# Patient Record
Sex: Female | Born: 1935 | Race: White | Hispanic: No | Marital: Married | Smoking: Never smoker
Health system: Southern US, Community
[De-identification: ages and names within clinical notes are randomized; demographics above are authoritative.]

## PROBLEM LIST (undated history)

## (undated) DIAGNOSIS — Q231 Congenital insufficiency of aortic valve: Secondary | ICD-10-CM

## (undated) DIAGNOSIS — Z8619 Personal history of other infectious and parasitic diseases: Secondary | ICD-10-CM

## (undated) DIAGNOSIS — S83209A Unspecified tear of unspecified meniscus, current injury, unspecified knee, initial encounter: Secondary | ICD-10-CM

## (undated) DIAGNOSIS — K449 Diaphragmatic hernia without obstruction or gangrene: Secondary | ICD-10-CM

## (undated) DIAGNOSIS — K5790 Diverticulosis of intestine, part unspecified, without perforation or abscess without bleeding: Secondary | ICD-10-CM

## (undated) DIAGNOSIS — M8580 Other specified disorders of bone density and structure, unspecified site: Secondary | ICD-10-CM

## (undated) DIAGNOSIS — Z8719 Personal history of other diseases of the digestive system: Secondary | ICD-10-CM

## (undated) DIAGNOSIS — G8929 Other chronic pain: Secondary | ICD-10-CM

## (undated) DIAGNOSIS — N809 Endometriosis, unspecified: Secondary | ICD-10-CM

## (undated) DIAGNOSIS — E785 Hyperlipidemia, unspecified: Secondary | ICD-10-CM

## (undated) DIAGNOSIS — I712 Thoracic aortic aneurysm, without rupture: Secondary | ICD-10-CM

## (undated) DIAGNOSIS — IMO0002 Reserved for concepts with insufficient information to code with codable children: Secondary | ICD-10-CM

## (undated) DIAGNOSIS — I341 Nonrheumatic mitral (valve) prolapse: Secondary | ICD-10-CM

## (undated) DIAGNOSIS — I8390 Asymptomatic varicose veins of unspecified lower extremity: Secondary | ICD-10-CM

## (undated) DIAGNOSIS — K317 Polyp of stomach and duodenum: Secondary | ICD-10-CM

## (undated) DIAGNOSIS — Z9889 Other specified postprocedural states: Secondary | ICD-10-CM

## (undated) DIAGNOSIS — E7800 Pure hypercholesterolemia, unspecified: Secondary | ICD-10-CM

## (undated) DIAGNOSIS — E119 Type 2 diabetes mellitus without complications: Secondary | ICD-10-CM

## (undated) DIAGNOSIS — I6529 Occlusion and stenosis of unspecified carotid artery: Secondary | ICD-10-CM

## (undated) DIAGNOSIS — K589 Irritable bowel syndrome without diarrhea: Secondary | ICD-10-CM

## (undated) DIAGNOSIS — G629 Polyneuropathy, unspecified: Secondary | ICD-10-CM

## (undated) DIAGNOSIS — S92911A Unspecified fracture of right toe(s), initial encounter for closed fracture: Secondary | ICD-10-CM

## (undated) DIAGNOSIS — E611 Iron deficiency: Secondary | ICD-10-CM

## (undated) DIAGNOSIS — R011 Cardiac murmur, unspecified: Secondary | ICD-10-CM

## (undated) DIAGNOSIS — G453 Amaurosis fugax: Secondary | ICD-10-CM

## (undated) DIAGNOSIS — Q2381 Bicuspid aortic valve: Secondary | ICD-10-CM

## (undated) DIAGNOSIS — R1032 Left lower quadrant pain: Secondary | ICD-10-CM

## (undated) DIAGNOSIS — G250 Essential tremor: Secondary | ICD-10-CM

## (undated) DIAGNOSIS — K2980 Duodenitis without bleeding: Secondary | ICD-10-CM

## (undated) DIAGNOSIS — F419 Anxiety disorder, unspecified: Secondary | ICD-10-CM

## (undated) DIAGNOSIS — N390 Urinary tract infection, site not specified: Secondary | ICD-10-CM

## (undated) DIAGNOSIS — K5792 Diverticulitis of intestine, part unspecified, without perforation or abscess without bleeding: Secondary | ICD-10-CM

## (undated) DIAGNOSIS — I10 Essential (primary) hypertension: Secondary | ICD-10-CM

## (undated) DIAGNOSIS — J309 Allergic rhinitis, unspecified: Secondary | ICD-10-CM

## (undated) DIAGNOSIS — D126 Benign neoplasm of colon, unspecified: Secondary | ICD-10-CM

## (undated) DIAGNOSIS — K219 Gastro-esophageal reflux disease without esophagitis: Secondary | ICD-10-CM

## (undated) DIAGNOSIS — Z8679 Personal history of other diseases of the circulatory system: Secondary | ICD-10-CM

## (undated) DIAGNOSIS — C801 Malignant (primary) neoplasm, unspecified: Secondary | ICD-10-CM

## (undated) HISTORY — PX: INTRAOCULAR LENS INSERTION: SHX110

## (undated) HISTORY — DX: Other specified disorders of bone density and structure, unspecified site: M85.80

## (undated) HISTORY — DX: Allergic rhinitis, unspecified: J30.9

## (undated) HISTORY — DX: Unspecified fracture of right toe(s), initial encounter for closed fracture: S92.911A

## (undated) HISTORY — DX: Polyneuropathy, unspecified: G62.9

## (undated) HISTORY — PX: CEREBRAL ANEURYSM REPAIR: SHX164

## (undated) HISTORY — DX: Unspecified tear of unspecified meniscus, current injury, unspecified knee, initial encounter: S83.209A

## (undated) HISTORY — PX: APPENDECTOMY: SHX54

## (undated) HISTORY — DX: Irritable bowel syndrome, unspecified: K58.9

## (undated) HISTORY — DX: Essential (primary) hypertension: I10

## (undated) HISTORY — DX: Bicuspid aortic valve: Q23.81

## (undated) HISTORY — DX: Urinary tract infection, site not specified: N39.0

## (undated) HISTORY — DX: Personal history of other diseases of the digestive system: Z87.19

## (undated) HISTORY — DX: Occlusion and stenosis of unspecified carotid artery: I65.29

## (undated) HISTORY — DX: Diverticulitis of intestine, part unspecified, without perforation or abscess without bleeding: K57.92

## (undated) HISTORY — DX: Thoracic aortic aneurysm, without rupture: I71.2

## (undated) HISTORY — DX: Type 2 diabetes mellitus without complications: E11.9

## (undated) HISTORY — DX: Reserved for concepts with insufficient information to code with codable children: IMO0002

## (undated) HISTORY — PX: CORONARY ANGIOPLASTY WITH STENT PLACEMENT: SHX49

## (undated) HISTORY — DX: Essential tremor: G25.0

## (undated) HISTORY — DX: Cardiac murmur, unspecified: R01.1

## (undated) HISTORY — PX: COLON RESECTION: SHX5231

## (undated) HISTORY — DX: Personal history of other diseases of the circulatory system: Z98.890

## (undated) HISTORY — DX: Personal history of other diseases of the circulatory system: Z86.79

## (undated) HISTORY — DX: Anxiety disorder, unspecified: F41.9

## (undated) HISTORY — DX: Endometriosis, unspecified: N80.9

## (undated) HISTORY — PX: TONSILLECTOMY: SUR1361

## (undated) HISTORY — DX: Other chronic pain: G89.29

## (undated) HISTORY — DX: Iron deficiency: E61.1

## (undated) HISTORY — DX: Duodenitis without bleeding: K29.80

## (undated) HISTORY — DX: Nonrheumatic mitral (valve) prolapse: I34.1

## (undated) HISTORY — DX: Benign neoplasm of colon, unspecified: D12.6

## (undated) HISTORY — PX: FLEXIBLE SIGMOIDOSCOPY: SHX1649

## (undated) HISTORY — DX: Diverticulosis of intestine, part unspecified, without perforation or abscess without bleeding: K57.90

## (undated) HISTORY — DX: Pure hypercholesterolemia, unspecified: E78.00

## (undated) HISTORY — DX: Personal history of other infectious and parasitic diseases: Z86.19

## (undated) HISTORY — DX: Polyp of stomach and duodenum: K31.7

## (undated) HISTORY — DX: Hyperlipidemia, unspecified: E78.5

## (undated) HISTORY — DX: Gastro-esophageal reflux disease without esophagitis: K21.9

## (undated) HISTORY — PX: CATARACT EXTRACTION: SUR2

## (undated) HISTORY — DX: Diaphragmatic hernia without obstruction or gangrene: K44.9

## (undated) HISTORY — DX: Amaurosis fugax: G45.3

## (undated) HISTORY — DX: Congenital insufficiency of aortic valve: Q23.1

## (undated) HISTORY — PX: ABDOMINAL HYSTERECTOMY: SHX81

## (undated) HISTORY — DX: Left lower quadrant pain: R10.32

## (undated) HISTORY — DX: Asymptomatic varicose veins of unspecified lower extremity: I83.90

## (undated) HISTORY — PX: HAND SURGERY: SHX662

---

## 1968-02-01 DIAGNOSIS — D126 Benign neoplasm of colon, unspecified: Secondary | ICD-10-CM

## 1968-02-01 HISTORY — DX: Benign neoplasm of colon, unspecified: D12.6

## 1998-04-09 ENCOUNTER — Ambulatory Visit (HOSPITAL_COMMUNITY): Admission: RE | Admit: 1998-04-09 | Discharge: 1998-04-09 | Payer: Self-pay | Admitting: Gastroenterology

## 1999-01-18 ENCOUNTER — Ambulatory Visit: Admission: RE | Admit: 1999-01-18 | Discharge: 1999-01-18 | Payer: Self-pay | Admitting: Neurology

## 2000-08-17 ENCOUNTER — Encounter: Payer: Self-pay | Admitting: Emergency Medicine

## 2000-08-17 ENCOUNTER — Inpatient Hospital Stay (HOSPITAL_COMMUNITY): Admission: AD | Admit: 2000-08-17 | Discharge: 2000-08-19 | Payer: Self-pay | Admitting: Cardiology

## 2000-08-18 ENCOUNTER — Encounter: Payer: Self-pay | Admitting: Cardiology

## 2000-11-15 ENCOUNTER — Other Ambulatory Visit: Admission: RE | Admit: 2000-11-15 | Discharge: 2000-11-15 | Payer: Self-pay | Admitting: Gastroenterology

## 2000-11-15 ENCOUNTER — Encounter (INDEPENDENT_AMBULATORY_CARE_PROVIDER_SITE_OTHER): Payer: Self-pay | Admitting: Specialist

## 2001-05-29 ENCOUNTER — Other Ambulatory Visit: Admission: RE | Admit: 2001-05-29 | Discharge: 2001-05-29 | Payer: Self-pay | Admitting: Obstetrics & Gynecology

## 2001-10-28 ENCOUNTER — Encounter: Payer: Self-pay | Admitting: *Deleted

## 2001-10-28 ENCOUNTER — Ambulatory Visit (HOSPITAL_COMMUNITY): Admission: RE | Admit: 2001-10-28 | Discharge: 2001-10-28 | Payer: Self-pay | Admitting: *Deleted

## 2001-11-29 ENCOUNTER — Encounter: Payer: Self-pay | Admitting: Neurology

## 2001-11-29 ENCOUNTER — Ambulatory Visit (HOSPITAL_COMMUNITY): Admission: RE | Admit: 2001-11-29 | Discharge: 2001-11-29 | Payer: Self-pay | Admitting: Neurology

## 2001-12-03 ENCOUNTER — Ambulatory Visit (HOSPITAL_COMMUNITY): Admission: RE | Admit: 2001-12-03 | Discharge: 2001-12-03 | Payer: Self-pay | Admitting: Neurology

## 2001-12-14 ENCOUNTER — Ambulatory Visit: Admission: RE | Admit: 2001-12-14 | Discharge: 2001-12-14 | Payer: Self-pay | Admitting: Interventional Radiology

## 2002-01-10 ENCOUNTER — Inpatient Hospital Stay (HOSPITAL_COMMUNITY): Admission: RE | Admit: 2002-01-10 | Discharge: 2002-01-12 | Payer: Self-pay | Admitting: Interventional Radiology

## 2002-01-14 ENCOUNTER — Ambulatory Visit (HOSPITAL_COMMUNITY): Admission: RE | Admit: 2002-01-14 | Discharge: 2002-01-14 | Payer: Self-pay | Admitting: Interventional Radiology

## 2002-01-28 ENCOUNTER — Ambulatory Visit (HOSPITAL_COMMUNITY): Admission: RE | Admit: 2002-01-28 | Discharge: 2002-01-28 | Payer: Self-pay | Admitting: Interventional Radiology

## 2002-01-30 ENCOUNTER — Ambulatory Visit (HOSPITAL_COMMUNITY): Admission: RE | Admit: 2002-01-30 | Discharge: 2002-01-30 | Payer: Self-pay | Admitting: Family Medicine

## 2002-01-30 ENCOUNTER — Encounter: Payer: Self-pay | Admitting: Family Medicine

## 2002-04-04 ENCOUNTER — Inpatient Hospital Stay (HOSPITAL_COMMUNITY): Admission: RE | Admit: 2002-04-04 | Discharge: 2002-04-06 | Payer: Self-pay | Admitting: Interventional Radiology

## 2002-05-27 ENCOUNTER — Ambulatory Visit (HOSPITAL_COMMUNITY): Admission: RE | Admit: 2002-05-27 | Discharge: 2002-05-27 | Payer: Self-pay | Admitting: Neurology

## 2002-06-01 ENCOUNTER — Ambulatory Visit (HOSPITAL_COMMUNITY): Admission: RE | Admit: 2002-06-01 | Discharge: 2002-06-01 | Payer: Self-pay | Admitting: Neurology

## 2002-06-06 ENCOUNTER — Ambulatory Visit (HOSPITAL_COMMUNITY): Admission: RE | Admit: 2002-06-06 | Discharge: 2002-06-06 | Payer: Self-pay | Admitting: Neurology

## 2002-09-26 ENCOUNTER — Ambulatory Visit (HOSPITAL_COMMUNITY): Admission: RE | Admit: 2002-09-26 | Discharge: 2002-09-26 | Payer: Self-pay | Admitting: Interventional Radiology

## 2002-12-13 ENCOUNTER — Ambulatory Visit (HOSPITAL_COMMUNITY): Admission: RE | Admit: 2002-12-13 | Discharge: 2002-12-13 | Payer: Self-pay | Admitting: Gastroenterology

## 2003-03-05 ENCOUNTER — Ambulatory Visit (HOSPITAL_COMMUNITY): Admission: RE | Admit: 2003-03-05 | Discharge: 2003-03-05 | Payer: Self-pay | Admitting: Interventional Radiology

## 2003-11-02 ENCOUNTER — Ambulatory Visit (HOSPITAL_COMMUNITY): Admission: RE | Admit: 2003-11-02 | Discharge: 2003-11-02 | Payer: Self-pay | Admitting: Neurology

## 2003-11-30 ENCOUNTER — Ambulatory Visit (HOSPITAL_COMMUNITY): Admission: RE | Admit: 2003-11-30 | Discharge: 2003-11-30 | Payer: Self-pay | Admitting: Interventional Radiology

## 2004-02-09 ENCOUNTER — Ambulatory Visit (HOSPITAL_COMMUNITY): Admission: RE | Admit: 2004-02-09 | Discharge: 2004-02-09 | Payer: Self-pay | Admitting: Interventional Radiology

## 2004-03-02 ENCOUNTER — Encounter: Admission: RE | Admit: 2004-03-02 | Discharge: 2004-05-31 | Payer: Self-pay | Admitting: Family Medicine

## 2004-06-03 ENCOUNTER — Encounter: Admission: RE | Admit: 2004-06-03 | Discharge: 2004-09-01 | Payer: Self-pay | Admitting: Family Medicine

## 2004-09-14 ENCOUNTER — Inpatient Hospital Stay (HOSPITAL_COMMUNITY): Admission: EM | Admit: 2004-09-14 | Discharge: 2004-09-15 | Payer: Self-pay | Admitting: Emergency Medicine

## 2004-09-14 ENCOUNTER — Encounter (INDEPENDENT_AMBULATORY_CARE_PROVIDER_SITE_OTHER): Payer: Self-pay | Admitting: Cardiology

## 2005-01-20 ENCOUNTER — Ambulatory Visit: Payer: Self-pay | Admitting: Gastroenterology

## 2005-02-03 ENCOUNTER — Ambulatory Visit: Payer: Self-pay | Admitting: Gastroenterology

## 2005-02-03 DIAGNOSIS — K449 Diaphragmatic hernia without obstruction or gangrene: Secondary | ICD-10-CM

## 2005-02-03 HISTORY — DX: Diaphragmatic hernia without obstruction or gangrene: K44.9

## 2005-02-03 HISTORY — PX: COLONOSCOPY W/ BIOPSIES AND POLYPECTOMY: SHX1376

## 2005-02-03 HISTORY — PX: ESOPHAGOGASTRODUODENOSCOPY: SHX1529

## 2005-03-11 ENCOUNTER — Ambulatory Visit (HOSPITAL_COMMUNITY): Admission: RE | Admit: 2005-03-11 | Discharge: 2005-03-11 | Payer: Self-pay | Admitting: Interventional Radiology

## 2005-05-16 ENCOUNTER — Encounter: Payer: Self-pay | Admitting: Vascular Surgery

## 2005-05-16 ENCOUNTER — Ambulatory Visit (HOSPITAL_COMMUNITY): Admission: RE | Admit: 2005-05-16 | Discharge: 2005-05-16 | Payer: Self-pay | Admitting: Family Medicine

## 2005-12-01 ENCOUNTER — Ambulatory Visit (HOSPITAL_COMMUNITY): Admission: RE | Admit: 2005-12-01 | Discharge: 2005-12-01 | Payer: Self-pay | Admitting: Interventional Radiology

## 2005-12-09 ENCOUNTER — Encounter: Payer: Self-pay | Admitting: Interventional Radiology

## 2006-06-29 ENCOUNTER — Ambulatory Visit (HOSPITAL_COMMUNITY): Admission: RE | Admit: 2006-06-29 | Discharge: 2006-06-29 | Payer: Self-pay | Admitting: Family Medicine

## 2006-06-29 ENCOUNTER — Ambulatory Visit: Payer: Self-pay | Admitting: Vascular Surgery

## 2006-06-29 ENCOUNTER — Encounter (INDEPENDENT_AMBULATORY_CARE_PROVIDER_SITE_OTHER): Payer: Self-pay | Admitting: Family Medicine

## 2006-07-01 ENCOUNTER — Emergency Department (HOSPITAL_COMMUNITY): Admission: EM | Admit: 2006-07-01 | Discharge: 2006-07-01 | Payer: Self-pay | Admitting: Emergency Medicine

## 2006-07-13 ENCOUNTER — Ambulatory Visit (HOSPITAL_COMMUNITY): Admission: RE | Admit: 2006-07-13 | Discharge: 2006-07-13 | Payer: Self-pay | Admitting: Orthopedic Surgery

## 2006-12-13 ENCOUNTER — Ambulatory Visit (HOSPITAL_COMMUNITY): Admission: RE | Admit: 2006-12-13 | Discharge: 2006-12-13 | Payer: Self-pay | Admitting: Interventional Radiology

## 2007-08-26 ENCOUNTER — Inpatient Hospital Stay (HOSPITAL_COMMUNITY): Admission: EM | Admit: 2007-08-26 | Discharge: 2007-08-29 | Payer: Self-pay | Admitting: Emergency Medicine

## 2007-08-26 HISTORY — PX: CARPAL TUNNEL RELEASE: SHX101

## 2007-08-26 HISTORY — PX: OTHER SURGICAL HISTORY: SHX169

## 2007-08-27 ENCOUNTER — Ambulatory Visit: Payer: Self-pay | Admitting: Vascular Surgery

## 2007-08-27 ENCOUNTER — Encounter (INDEPENDENT_AMBULATORY_CARE_PROVIDER_SITE_OTHER): Payer: Self-pay | Admitting: Internal Medicine

## 2007-08-28 ENCOUNTER — Encounter (INDEPENDENT_AMBULATORY_CARE_PROVIDER_SITE_OTHER): Payer: Self-pay | Admitting: Internal Medicine

## 2008-01-01 ENCOUNTER — Encounter: Payer: Self-pay | Admitting: Internal Medicine

## 2008-04-07 ENCOUNTER — Ambulatory Visit: Payer: Self-pay | Admitting: Internal Medicine

## 2008-04-07 DIAGNOSIS — K589 Irritable bowel syndrome without diarrhea: Secondary | ICD-10-CM

## 2008-04-07 DIAGNOSIS — Z860100 Personal history of colon polyps, unspecified: Secondary | ICD-10-CM | POA: Insufficient documentation

## 2008-04-07 DIAGNOSIS — Z85038 Personal history of other malignant neoplasm of large intestine: Secondary | ICD-10-CM | POA: Insufficient documentation

## 2008-05-21 ENCOUNTER — Ambulatory Visit: Payer: Self-pay | Admitting: Internal Medicine

## 2008-05-21 DIAGNOSIS — K649 Unspecified hemorrhoids: Secondary | ICD-10-CM

## 2008-05-21 DIAGNOSIS — R5383 Other fatigue: Secondary | ICD-10-CM

## 2008-05-21 DIAGNOSIS — R5381 Other malaise: Secondary | ICD-10-CM

## 2008-05-22 DIAGNOSIS — D509 Iron deficiency anemia, unspecified: Secondary | ICD-10-CM

## 2008-05-22 LAB — CONVERTED CEMR LAB
ALT: 22 units/L (ref 0–35)
BUN: 14 mg/dL (ref 6–23)
Basophils Absolute: 0 10*3/uL (ref 0.0–0.1)
CO2: 31 meq/L (ref 19–32)
Chloride: 104 meq/L (ref 96–112)
Eosinophils Absolute: 0.2 10*3/uL (ref 0.0–0.7)
Glucose, Bld: 85 mg/dL (ref 70–99)
HCT: 36.5 % (ref 36.0–46.0)
Lymphs Abs: 4.2 10*3/uL — ABNORMAL HIGH (ref 0.7–4.0)
Monocytes Absolute: 0.6 10*3/uL (ref 0.1–1.0)
Potassium: 4.4 meq/L (ref 3.5–5.1)
RBC: 4.95 M/uL (ref 3.87–5.11)
RDW: 14.7 % — ABNORMAL HIGH (ref 11.5–14.6)
Sodium: 141 meq/L (ref 135–145)
Total Bilirubin: 0.6 mg/dL (ref 0.3–1.2)
WBC: 9.1 10*3/uL (ref 4.5–10.5)

## 2008-05-26 ENCOUNTER — Ambulatory Visit: Payer: Self-pay | Admitting: Internal Medicine

## 2008-05-27 LAB — CONVERTED CEMR LAB
Ferritin: 10.2 ng/mL (ref 10.0–291.0)
Iron: 144 ug/dL (ref 42–145)
Saturation Ratios: 32.6 % (ref 20.0–50.0)
Transferrin: 315.3 mg/dL (ref 212.0–360.0)

## 2008-06-02 ENCOUNTER — Encounter: Admission: RE | Admit: 2008-06-02 | Discharge: 2008-06-02 | Payer: Self-pay | Admitting: Family Medicine

## 2008-06-07 HISTORY — PX: COLONOSCOPY: SHX174

## 2008-06-11 ENCOUNTER — Ambulatory Visit: Payer: Self-pay | Admitting: Internal Medicine

## 2008-06-17 ENCOUNTER — Ambulatory Visit: Payer: Self-pay | Admitting: Internal Medicine

## 2008-12-15 ENCOUNTER — Telehealth: Payer: Self-pay | Admitting: Internal Medicine

## 2008-12-22 ENCOUNTER — Ambulatory Visit: Payer: Self-pay | Admitting: Internal Medicine

## 2008-12-22 DIAGNOSIS — K219 Gastro-esophageal reflux disease without esophagitis: Secondary | ICD-10-CM | POA: Insufficient documentation

## 2008-12-22 DIAGNOSIS — R197 Diarrhea, unspecified: Secondary | ICD-10-CM | POA: Insufficient documentation

## 2008-12-22 LAB — CONVERTED CEMR LAB
Basophils Relative: 0.9 % (ref 0.0–3.0)
Eosinophils Absolute: 0.1 10*3/uL (ref 0.0–0.7)
Ferritin: 13.9 ng/mL (ref 10.0–291.0)
Hemoglobin: 12.9 g/dL (ref 12.0–15.0)
Lymphs Abs: 3.1 10*3/uL (ref 0.7–4.0)
MCV: 78.3 fL (ref 78.0–100.0)
RBC: 5.01 M/uL (ref 3.87–5.11)
RDW: 14.9 % — ABNORMAL HIGH (ref 11.5–14.6)

## 2009-01-05 ENCOUNTER — Ambulatory Visit (HOSPITAL_COMMUNITY): Admission: RE | Admit: 2009-01-05 | Discharge: 2009-01-05 | Payer: Self-pay | Admitting: Interventional Radiology

## 2009-09-08 ENCOUNTER — Encounter: Payer: Self-pay | Admitting: Internal Medicine

## 2010-01-04 ENCOUNTER — Encounter: Admission: RE | Admit: 2010-01-04 | Discharge: 2010-01-04 | Payer: Self-pay | Admitting: Family Medicine

## 2010-02-15 ENCOUNTER — Ambulatory Visit (HOSPITAL_COMMUNITY)
Admission: RE | Admit: 2010-02-15 | Discharge: 2010-02-15 | Payer: Self-pay | Source: Home / Self Care | Attending: Interventional Radiology | Admitting: Interventional Radiology

## 2010-02-17 LAB — BUN: BUN: 13 mg/dL (ref 6–23)

## 2010-02-17 LAB — CREATININE, SERUM
Creatinine, Ser: 0.72 mg/dL (ref 0.4–1.2)
GFR calc Af Amer: >60 mL/min (ref >60)
GFR calc non Af Amer: >60 mL/min (ref >60)

## 2010-02-21 ENCOUNTER — Encounter: Payer: Self-pay | Admitting: Orthopedic Surgery

## 2010-03-02 NOTE — Medication Information (Signed)
Summary: Prior autho & Approved for Pantoprazole/[employer]  Prior autho & Approved for Pantoprazole/[employer]   Imported By: Sherian Rein 09/14/2009 10:40:28  _____________________________________________________________________  External Attachment:    Type:   Image     Comment:   External Document

## 2010-03-23 ENCOUNTER — Encounter: Payer: Self-pay | Admitting: Internal Medicine

## 2010-03-23 ENCOUNTER — Emergency Department (HOSPITAL_COMMUNITY): Payer: Medicare Other

## 2010-03-23 ENCOUNTER — Telehealth: Payer: Self-pay | Admitting: Internal Medicine

## 2010-03-23 ENCOUNTER — Encounter (INDEPENDENT_AMBULATORY_CARE_PROVIDER_SITE_OTHER): Payer: Self-pay | Admitting: *Deleted

## 2010-03-23 ENCOUNTER — Emergency Department (HOSPITAL_COMMUNITY)
Admission: EM | Admit: 2010-03-23 | Discharge: 2010-03-23 | Disposition: A | Discharge status: Home / Self Care | Payer: Medicare Other | Attending: Emergency Medicine | Admitting: Emergency Medicine

## 2010-03-23 DIAGNOSIS — Z9889 Other specified postprocedural states: Secondary | ICD-10-CM | POA: Insufficient documentation

## 2010-03-23 DIAGNOSIS — F341 Dysthymic disorder: Secondary | ICD-10-CM | POA: Insufficient documentation

## 2010-03-23 DIAGNOSIS — I10 Essential (primary) hypertension: Secondary | ICD-10-CM | POA: Insufficient documentation

## 2010-03-23 DIAGNOSIS — E119 Type 2 diabetes mellitus without complications: Secondary | ICD-10-CM | POA: Insufficient documentation

## 2010-03-23 DIAGNOSIS — R10819 Abdominal tenderness, unspecified site: Secondary | ICD-10-CM | POA: Insufficient documentation

## 2010-03-23 DIAGNOSIS — R109 Unspecified abdominal pain: Secondary | ICD-10-CM | POA: Insufficient documentation

## 2010-03-23 DIAGNOSIS — Z79899 Other long term (current) drug therapy: Secondary | ICD-10-CM | POA: Insufficient documentation

## 2010-03-23 LAB — COMPREHENSIVE METABOLIC PANEL
ALT: 18 U/L (ref 0–35)
Albumin: 3.7 g/dL (ref 3.5–5.2)
Alkaline Phosphatase: 69 U/L (ref 39–117)
Chloride: 105 mEq/L (ref 96–112)
Glucose, Bld: 105 mg/dL — ABNORMAL HIGH (ref 70–99)
Potassium: 4.7 mEq/L (ref 3.5–5.1)
Sodium: 138 mEq/L (ref 135–145)
Total Bilirubin: 0.5 mg/dL (ref 0.3–1.2)
Total Protein: 7 g/dL (ref 6.0–8.3)

## 2010-03-23 LAB — URINALYSIS, ROUTINE W REFLEX MICROSCOPIC
Bilirubin Urine: NEGATIVE
Ketones, ur: NEGATIVE mg/dL
Protein, ur: NEGATIVE mg/dL
Urine Glucose, Fasting: NEGATIVE mg/dL
pH: 5.5 (ref 5.0–8.0)

## 2010-03-23 LAB — DIFFERENTIAL
Basophils Absolute: 0 10*3/uL (ref 0.0–0.1)
Lymphocytes Relative: 51 % — ABNORMAL HIGH (ref 12–46)
Monocytes Absolute: 0.6 10*3/uL (ref 0.1–1.0)
Monocytes Relative: 6 % (ref 3–12)
Neutro Abs: 4.6 10*3/uL (ref 1.7–7.7)
Neutrophils Relative %: 42 % — ABNORMAL LOW (ref 43–77)

## 2010-03-23 LAB — CBC
HCT: 38 % (ref 36.0–46.0)
MCV: 78.4 fL (ref 78.0–100.0)
RBC: 4.85 MIL/uL (ref 3.87–5.11)
RDW: 15.7 % — ABNORMAL HIGH (ref 11.5–15.5)
WBC: 11.1 10*3/uL — ABNORMAL HIGH (ref 4.0–10.5)

## 2010-03-23 LAB — OCCULT BLOOD, POC DEVICE: Fecal Occult Bld: NEGATIVE

## 2010-03-23 LAB — LIPASE, BLOOD: Lipase: 18 U/L (ref 11–59)

## 2010-03-23 MED ORDER — IOHEXOL 300 MG/ML  SOLN
100.0000 mL | Freq: Once | INTRAMUSCULAR | Status: AC | PRN
  Administered 2010-03-23: 100 mL via INTRAVENOUS

## 2010-03-24 ENCOUNTER — Telehealth: Payer: Self-pay | Admitting: Internal Medicine

## 2010-03-25 LAB — URINE CULTURE
Colony Count: 30000
Culture  Setup Time: 201202220148

## 2010-03-26 ENCOUNTER — Ambulatory Visit (INDEPENDENT_AMBULATORY_CARE_PROVIDER_SITE_OTHER): Payer: Medicare Other | Admitting: Physician Assistant

## 2010-03-26 ENCOUNTER — Other Ambulatory Visit: Payer: Medicare Other

## 2010-03-26 ENCOUNTER — Other Ambulatory Visit: Payer: Self-pay | Admitting: Physician Assistant

## 2010-03-26 ENCOUNTER — Encounter: Payer: Self-pay | Admitting: Physician Assistant

## 2010-03-26 DIAGNOSIS — R1084 Generalized abdominal pain: Secondary | ICD-10-CM

## 2010-03-26 DIAGNOSIS — D509 Iron deficiency anemia, unspecified: Secondary | ICD-10-CM

## 2010-03-26 DIAGNOSIS — R1032 Left lower quadrant pain: Secondary | ICD-10-CM

## 2010-03-26 DIAGNOSIS — F411 Generalized anxiety disorder: Secondary | ICD-10-CM | POA: Insufficient documentation

## 2010-03-26 DIAGNOSIS — I10 Essential (primary) hypertension: Secondary | ICD-10-CM | POA: Insufficient documentation

## 2010-03-26 DIAGNOSIS — K5730 Diverticulosis of large intestine without perforation or abscess without bleeding: Secondary | ICD-10-CM

## 2010-03-26 DIAGNOSIS — R1031 Right lower quadrant pain: Secondary | ICD-10-CM | POA: Insufficient documentation

## 2010-03-26 DIAGNOSIS — R197 Diarrhea, unspecified: Secondary | ICD-10-CM

## 2010-03-26 DIAGNOSIS — K589 Irritable bowel syndrome without diarrhea: Secondary | ICD-10-CM

## 2010-03-26 LAB — IBC PANEL: Saturation Ratios: 12.6 % — ABNORMAL LOW (ref 20.0–50.0)

## 2010-03-26 LAB — IRON: Iron: 50 ug/dL (ref 42–145)

## 2010-03-29 ENCOUNTER — Telehealth: Payer: Self-pay | Admitting: Physician Assistant

## 2010-03-29 ENCOUNTER — Encounter: Payer: Self-pay | Admitting: Physician Assistant

## 2010-03-30 NOTE — Assessment & Plan Note (Addendum)
Summary: follow up ER visit abdominal pain and rectal bleeding/sheri   History of Present Illness Visit Type: Follow-up Visit Primary GI MD: Stan Head MD [facility] Primary Provider: Tally Joe, MD Chief Complaint: Patient complains of lower abdomainl pain and BRB on the tissue and on the stool. She states that this started off and on for more than a year. She complains of some rectal pain that is constant and frequent stools after every BM. History of Present Illness:   [name] 77 YO FEMALE KNOWN TO DR. Leone Payor. SHE HAS HX OF IBS, GERD, DIVERTICULOSIS. SHE HAD A COLONOSCOPY IN 5/10 SHOWING MODERATE DIVERTICULOSIS. SHE HAS HX OF COLON POLYPS AND A POLYP WITH CARCINOMA IN SITU. NO POLYPS SEEN AT LAST COLON.  SHE COMES IN NOW WITH C/O LOWER ABDOMINAL PAIN X 1 AND 1/2 WEEKS ACROSS HER LOWER ABDOMEN, WORSE IN LLQ. IT HAS BEEN FAIRLY CONTANT, AND WORSE BEFORE AND AFTER BM'S. NO FEVER, NO NAUSEA/VOMITINGAPPETITE OK. HAS HAD FREQUENT STOOL BUT NO DIARRHEA. SHE WENT TO THE ER EARLIER THIS WEEK BECAUSE SHE COULD NOT GET IN WITH HER PRIMARY. CT ABD/PELVIS WAS NEGATIVE. WBC MILDLY ELEVATED AT 11.1, HGB 11.9, MCV78. REMAINDER OF LABS UNREMARKABLE..SHE SAYS SHE IS UNDER A LOT OF CHRONIC STRESS AS HER HUSBAND HAS DEMENTIA AND SHE IS HIS CAREGIVER. SHE FEELS SOME OF HER SXS ARE IBS BUT THE PAIN HAS BEEN DIFFERENT THIS PAST WEEK OR SO.   GI Review of Systems    Reports abdominal pain.     Location of  Abdominal pain: lower abdomen.    Denies acid reflux, belching, bloating, chest pain, dysphagia with liquids, dysphagia with solids, heartburn, loss of appetite, nausea, vomiting, vomiting blood, weight loss, and  weight gain.      Reports change in bowel habits, rectal bleeding, and  rectal pain.     Denies anal fissure, black tarry stools, constipation, diarrhea, diverticulosis, fecal incontinence, heme positive stool, hemorrhoids, irritable bowel syndrome, jaundice, light color stool, and  liver problems.    Current Medications (verified): 1)  Atenolol 50 Mg Tabs (Atenolol) .... Take 1 Tablet By Mouth Once A Day 2)  Xanax 0.5 Mg Tabs (Alprazolam) .... Take 1/2 Tablet By Mouth At Bedtime As Needed 3)  Trandolapril 4 Mg Tabs (Trandolapril) .... Take 1 Tablet By Mouth Once A Day 4)  Lasix 20 Mg Tabs (Furosemide) .... Take One By Mouth Once Daily As Needed 5)  Proctocream-Hc 2.5 % Crea (Hydrocortisone) .... Apply Small Amount To Anal Area As Needed For Hemorrhoids (Out) 6)  Lomotil 2.5-0.025 Mg Tabs (Diphenoxylate-Atropine) .... Take 1-2 Tablets By Mouth Before Meals and At Bedtime As Needed For Diarrhea 7)  Pantoprazole Sodium 40 Mg Tbec (Pantoprazole Sodium) .Marland Kitchen.. 1 By Mouth Once Daily 8)  Multivitamins  Tabs (Multiple Vitamin) .... Take One By Mouth Once Daily 9)  Coenzyme Q10 100 Mg Caps (Coenzyme Q10) .... Take One By Mouth Once Daily 10)  Fish Oil 1000 Mg Caps (Omega-3 Fatty Acids) .... Take One By Mouth Once Daily 11)  Hydrocortisone Acetate 1 % Crea (Hydrocortisone Acetate) .... Apply To Rectum As Needed  Allergies (verified): 1)  ! Asa 2)  Pcn  Past History:  Past Medical History: Anxiety Disorder Chronic Headaches (Migraine) Diabetes GERD Hyperlipidemia Hypertension Hx of Cerebral Aneurysm, status post coiling, one stable being monitored Peripheral Neuropathy, which predated diabetes IBS DIVERTICULOSIS HX OF COLON POLYPS/AND CARCINOMA IN SITU  Past Surgical History: Rt hand decompressive fasciotomy, dorsal and volar 08/26/07 Carpal Tunnel Release 08/26/07 Appendectomy Colon Resection  x 1 (Carcinoma in situ) and surgical polypectomy (2 surgeries) Hysterectomy PTCA-Stent Brain aneurysm COLONOSCOPY 5/10 [facility]  Family History: Reviewed history from 12/22/2008 and no changes required. Family History of Ovarian Cancer:Mother Family History of Diabetes: Grandparents Family History of Heart Disease: Father, Brothers Family History of Kidney Disease:Father No FH of Colon  Cancer:  Social History: Reviewed history from 04/07/2008 and no changes required. married,  Occupation: Retired Patient has never smoked.  Alcohol Use - no Daily Caffeine Use 2-3 Illicit Drug Use - no  Review of Systems       The patient complains of shortness of breath, sleeping problems, and swelling of feet/legs.  The patient denies allergy/sinus, anemia, anxiety-new, arthritis/joint pain, back pain, blood in urine, breast changes/lumps, change in vision, confusion, cough, coughing up blood, depression-new, fainting, fatigue, fever, headaches-new, hearing problems, heart murmur, heart rhythm changes, itching, menstrual pain, muscle pains/cramps, night sweats, nosebleeds, pregnancy symptoms, skin rash, sore throat, swollen lymph glands, thirst - excessive , urination - excessive , urination changes/pain, urine leakage, vision changes, and voice change.         SEE HPI  Vital Signs:  Patient profile:   77 year old female Height:      63 inches Weight:      177.2 pounds BMI:     31.50 Pulse rate:   60 / minute Pulse rhythm:   regular BP sitting:   160 / 72  (left arm) Cuff size:   regular  Vitals Entered By: Harlow Mares CMA Duncan Dull) (March 26, 2010 1:37 PM)  Physical Exam  General:  Well developed, well nourished, no acute distress. Head:  Normocephalic and atraumatic. Eyes:  PERRLA, no icterus. Lungs:  Clear throughout to auscultation. Heart:  Regular rate and rhythm; no murmurs, rubs,  or bruits. Abdomen:  SOFT, TENDER LLQ AND MILD SUPRAPUBIC, NO MASS OR HSM, NO GUARDING, BS+ Rectal:  NOT DONE Extremities:  No clubbing, cyanosis, edema or deformities noted. Neurologic:  Alert and  oriented x4;  grossly normal neurologically. Psych:  Alert and cooperative. Normal mood and affect.  Impression & Recommendations:  Problem # 1:  ABDOMINAL PAIN-LLQ (ICD-789.04) Assessment New 77 YO FEMALE WITH 1 1/2 WEEK HX OF PERSISTENT LOWER ABDOMINAL PAIN ,PRIMARILY LLQ. RECENT CT  SHOWED NO DIVERTICULITIS, BUT WBC ELEVATED 11.1. R/O MILD DIVERTICULITIS SUPERIMPOSED ON CHRONIC IBS. WILL TREAT WITH CIPRO 500 MG TWICE DAILY X 10 DAYS START ROBINUL FORTE 2 MG 1-2 DAILY AS NEEDED FOR CRAMPING.  Problem # 2:  IRON DEFICIENCY (ICD-280.9) Assessment: Unchanged PT WITH HX OF FE DEFICIENCY ANEMIA BACK TO 2010-NO RECENT IRON STUDIES OR FE RX. SHE WAS TO HAVE AN EGD FOR FURTHER EVALUATION IN 2010 BUT DID NOT RETURN FOR THAT, CHECK FE STUDIES SCHEDULE FOR EGD WITH DR. Florene Glen WILL CALL BACK TO SCHEDULE Orders: TLB-Ferritin (82728-FER) TLB-IBC Pnl (Iron/FE;Transferrin) (83550-IBC) TLB-Iron, (Fe) Total (83540-FE)  Problem # 3:  CARCINOMA IN SITU, COLON, HX OF (ICD-V10.05) Assessment: Comment Only LAST COLON 5/10-FOLLOW UP 2015  Problem # 4:  HYPERTENSION (ICD-401.9) Assessment: Comment Only  Problem # 5:  ANXIETY (ICD-300.00) Assessment: Comment Only  Problem # 6:  GERD (ICD-530.81) Assessment: Comment Only STABLE ON PROTONIX  Patient Instructions: 1)  Please go to lab, basement level. 2)  We sent two prescriptions to your pharmacy [facility], Cipro and Robinul forte.  3)  Copy sent to : Tally Joe, MD 4)  The medication list was reviewed and reconciled.  All changed / newly prescribed medications were explained.  A  complete medication list was provided to the patient / caregiver.  Prescriptions: ROBINUL-FORTE 2 MG TABS (GLYCOPYRROLATE) take 1 tab 1-2 times daily as needed for cramping and spasms  #60 x 0   Entered by:   Lowry Ram NCMA   Authorized by:   Sammuel Cooper PA-c   Signed by:   Lowry Ram NCMA on 03/26/2010   Method used:   [facility]  [phone]* (retail)       [address] [place], [place]  [address]       Ph: [phone] or [phone]       Fax: [phone]   RxID:   [id number] CIPRO 500 MG TABS (CIPROFLOXACIN HCL) Take 1 tab twice daily x 10 days  #20 x 0   Entered by:   Lowry Ram  NCMA   Authorized by:   Sammuel Cooper PA-c   Signed by:   Lowry Ram NCMA on 03/26/2010   Method used:   [facility]        [facility]  [phone]* (retail)       [address] [place], [place]  [address]       Ph: [phone] or [phone]       Fax: [phone]   RxID:   [id number]

## 2010-03-30 NOTE — Progress Notes (Signed)
Summary: Follow  up ER visit  Phone Note Outgoing Call Call back at [facility] Phone [phone]   Call placed by: Darcey Nora RN, CGRN,  March 24, 2010 4:45 PM Call placed to: Patient Summary of Call: I called and spoke with the patient and she was seen at the ER for abdominal pain and rectal bleeding.  CT scan was negative she was asked to follow up with her primary MD and Dr Leone Payor. She was not able to get in with her primary care .  She still has abdominal  pain and bleeding, she is offered an appointment for tomorrow with Mike Gip PA, she declines unable to get here because she won't have a ride.  She will come in and see Mike Gip PA on Friday 03/26/10 1:30 Initial call taken by: Darcey Nora RN, CGRN,  March 24, 2010 4:51 PM

## 2010-03-30 NOTE — Progress Notes (Signed)
Summary: triage  Phone Note Call from Patient Call back at Home Phone [phone]   Caller: Patient Call For: Dr Leone Payor Reason for Call: Talk to Nurse Summary of Call: Husband wants his wife seen today for severe stomach pain, offered patient appt for Thurs but he declined he wan's her seen today.  Husband called back and wants Dr Leone Payor to know that he took his wife to [facility] ED. Vladimir Crofts [facility]  March 23, 2010 2:32 PM Initial call taken by: Tawni Levy,  March 23, 2010 2:22 PM     Appended Document: triage can we see what happened with her?

## 2010-04-08 NOTE — Progress Notes (Signed)
Summary: Triage  Phone Note Call from Patient Call back at Home Phone [phone] Call back at [phone]   Caller: Patient Call For: [name] Reason for Call: Talk to Nurse Summary of Call: Pt is calling to let us know that she has stopped taking Cipro after 4 days because it was increasing her symptoms and not making her feel better, also got a call from [facility] that she has a UTI that they want to treat with antibiotics but she wants to consult with [place] first Initial call taken by: [place] Johnson,  March 29, 2010 8:25 AM  Follow-up for Phone Call        Patient calling to report she stopped taking the Cipro because it made her symptoms worse. She states it made her abdominal pain worse and yesterday when she took it she felt confused and had numbness in her face, arms and legs. She states she has perpherial neuropathy so she was not sure this was a part of it also. She also reports that the [facility] ER called her and told her she has a UTI and needed to start antibiotics. She told them she did not want to start anything until Hailey Gip, PA said it was okay. She states the ER told her they would fax the information to [place] for  Hailey Gip, PA to review. Urine culture in EMR. Please, advise Follow-up by: Jesse Fall RN,  March 29, 2010 9:20 AM  Additional Follow-up for Phone Call Additional follow up Details #1::        DO NOT TAKE ANYMORE CIPRO. THE CULTURE SHOWS A  LOW GRADE BLADDER IN FECTION. WOULD USE MACROBID 100 MG TWICE DAILY X 7 DAYS. Additional Follow-up by: Peterson Ao,  March 29, 2010 12:28 PM     Appended Document: Triage Spoke with patient's husband. She is out but will be back shortly. Will try again later to call patient.  Appended Document: Triage Spoke with patient and gave her Hailey Gip, PA recommendations. Rx to pharmacy

## 2010-04-08 NOTE — Miscellaneous (Signed)
  Clinical Lists Changes  Medications: Added new medication of MACROBID 100 MG CAPS (NITROFURANTOIN MONOHYD MACRO) Take one by mouth two times a day x 7 days - Signed Removed medication of CIPRO 500 MG TABS (CIPROFLOXACIN HCL) Take 1 tab twice daily x 10 days Rx of MACROBID 100 MG CAPS (NITROFURANTOIN MONOHYD MACRO) Take one by mouth two times a day x 7 days;  #14 x 0;  Signed;  Entered by: Jesse Fall RN;  Authorized by: Sammuel Cooper PA-c;  Method used: [facility] to [facility]  [phone]*, [address], [place], [place]  [address], Ph: [phone] or [phone], Fax: [phone]    Prescriptions: MACROBID 100 MG CAPS (NITROFURANTOIN MONOHYD MACRO) Take one by mouth two times a day x 7 days  #14 x 0   Entered by:   Jesse Fall RN   Authorized by:   Sammuel Cooper PA-c   Signed by:   Jesse Fall RN on 03/29/2010   Method used:   Electronically to        [facility]  [phone]* (retail)       [address] [place], [place]  [address]       Ph: [phone] or [phone]       Fax: [phone]   RxID:   [id number]

## 2010-05-04 LAB — CREATININE, SERUM
Creatinine, Ser: 0.73 mg/dL (ref 0.4–1.2)
GFR calc Af Amer: >60 mL/min (ref >60)

## 2010-05-04 LAB — BUN: BUN: 10 mg/dL (ref 6–23)

## 2010-06-15 NOTE — Op Note (Signed)
[name], CEJA NO.:  [id number]   MEDICAL RECORD NO.:  [id number]          PATIENT TYPE:  INP   LOCATION:  3730                         FACILITY:  [facility]   PHYSICIAN:  Madelynn Done, MD  DATE OF BIRTH:  08/21/1932   DATE OF PROCEDURE:  08/26/2007  DATE OF DISCHARGE:                               OPERATIVE REPORT   PREOPERATIVE DIAGNOSIS:  Right hand compartment syndrome.   POSTOPERATIVE DIAGNOSIS:  Right hand compartment syndrome.   ATTENDING SURGEON:  Madelynn Done, MD, who has scrubbed and present  for the entire procedure.   ASSISTANT SURGEON:  None.   PROCEDURES:  1. Right hand decompressive fasciotomies, dorsal and volar.  2. Right hand carpal tunnel release.   ANESTHESIA:  General via endotracheal tube.   TOURNIQUET TIME:  5 minutes for the carpal tunnel at 250 mmHg.   DRAINS:  Three vessel loops over the dorsal wounds.   SURGICAL INDICATIONS:  [name] is a 77 year old female who  sustained a contrast injection into the dorsum of her right hand.  I saw  and evaluated the patient following this extravasation injury, and the  patient had objective and subjective findings of a compartment syndrome  of the hand.  It was recommended that she be taken emergently to the  operating room to undergo decompression of the right hand.  A signed  informed consent was obtained.  Risks, benefits, and alternatives were  discussed with family, and they elected to proceed.   DESCRIPTION OF PROCEDURE:  The patient was properly identified in  preoperative holding area and marked with a permanent marker made on the  right hand to indicate the correct operative site.  The patient was then  brought back to the operating room and placed supine on the anesthesia  room table.  General anesthesia was administered via LMA.  The patient  tolerated this well.  A well-padded tourniquet was then placed on the  right brachium and sealed with a 1000 drape.  The  right upper extremity  was then prepped with Hibiclens and then sterilely draped.  A time-out  was called, correct side was identified, and the procedure was then  begun.  Attention was then turned dorsally where 2 longitudinal  incisions, one between the index and long finger metacarpal web space  and one between the ring and small finger web space were then made over  the dorsum of the hand.  The patient had a large hematoma over the  dorsal aspect of the hand and a lot of contrast material came out of the  wound where the decompression was done and using a Therapist, nutritional,  decompression was then done of the interossei as well as the adductor  compartment of the hand.  Attention was then turned volarly where a  small incision was then made over the hypothenar aspect of the hand and  decompressive fasciotomies were then done of the thenar and hypothenar  regions of the hand.  A longitudinal incision was then made directly in  line with the ring finger radial border and the limb  was then elevated  using Esmarch exsanguination, the tourniquet insufflated.  A carpal  tunnel release was then done.  Dissection was carried down through the  skin and subcutaneous tissues to identify the palmar fascia.  The palmar  fascia was then incised longitudinally and under direct visualization, a  carpal tunnel release was then performed releasing the transverse carpal  ligament.  The patient did have a moderate amount of fluid within the  carpal canal.  Following decompression of the entire right hand, the  hand looked a lot better and the fingers were pink. There was good  circulation distally.  Following this, the wounds were all then  thoroughly irrigated.  The volar wounds were then loosely reapproximated  with one 4-0 nylon suture of the thenar and hypothenar.  These were  simple sutures.  Several simple sutures were then used to close the  carpal tunnel incision.  The dorsal wounds were then tacked  with one 4-0  nylon sutures, and these were then closed over drains.  Adaptic dressing  was then applied.  A sterile compressive dressing was then applied, and  a bulky hand dressing was then applied.  The patient was then extubated  and taken to recovery room in good condition.  The patient tolerated the  procedure well.   POSTOPERATIVE PLAN:  The patient will be kept in the hospital.  She will  return to the operating room in approximately 48 hours for delayed  primary closure.      Madelynn Done, MD  Electronically Signed     FWO/MEDQ  D:  08/26/2007  T:  08/27/2007  Job:  [id number]

## 2010-06-15 NOTE — H&P (Signed)
[name], [name]               ACCOUNT NO.:  [id number]   MEDICAL RECORD NO.:  [id number]          PATIENT TYPE:  EMS   LOCATION:  MAJO                         FACILITY:  [facility]   PHYSICIAN:  Corinna L. Lendell Caprice, MDDATE OF BIRTH:  Oct 02, 1932   DATE OF ADMISSION:  08/26/2007  DATE OF DISCHARGE:                              HISTORY & PHYSICAL   CHIEF COMPLAINT:  Chest pain and shortness of breath.   HISTORY OF PRESENT ILLNESS:  Ms. Cryderman is a pleasant 77 year old white  female who presents after having woken up this morning with sudden onset  substernal chest pressure that radiated to her back.  She also felt very  short of breath.  She subsequently had tingling all over and took a  Xanax, but this did not provide any relief.  She had a cardiac  catheterization in July 2002, which showed clean coronaries and  dilatation of the aortic root.  She also reports that she has had a  stress test since then, which was unremarkable.  She still has the chest  pressure, but her shortness of breath has improved.  She has had cough  related to her acid reflux.  This has not changed recently.  She has not  been wheezing.  She has no history of asthma or COPD.  She has had no  fevers or chills.  She has had recent car trips lasting 3 hours each  over the past 2 weeks.  She also has noted that her right leg and calf  is more painful than usual.  She has peripheral neuropathy, but this has  worsened within the past couple of weeks.  She reports that she has had  a history of blood clot but has never been on Coumadin.  The chest  pain was not really pleuritic.  She cannot verbalize whether it feels  similar to her previous episodes of acid reflux.  Her risk factors for  heart disease are hypertension, hyperlipidemia, and diabetes.  She is  intolerant of STATINS.  Also her brother died of an MI at age 67.   PAST MEDICAL HISTORY:  1. Hypertension.  2. Peripheral neuropathy, which predated her  diabetes.  3. Type 2 diabetes.  4. Anxiety.  5. Gastroesophageal reflux disease.  6. History of cerebral aneurysm, status post coiling.  7. History of migraines.   MEDICATIONS:  1. Aciphex 20 mg a day.  2. Alprazolam 0.5 mg as needed, which is rare.  3. Atenolol 50 mg a day.  4. Januvia 50 mg a day.  5. Trandolapril 4 mg a day.  6. Lasix 20 mg a day.   ALLERGIES:  She is allergic to PENICILLIN.   SOCIAL HISTORY:  The patient is married.  She does not drink or smoke.   FAMILY HISTORY:  As above.   REVIEW OF SYSTEMS:  As above.  Otherwise negative.   PHYSICAL EXAMINATION:  VITAL SIGNS:  Temperature is 97.9, blood pressure  144/73, pulse 62, respiratory rate 24, and oxygen saturation 100%.  GENERAL:  The patient is well nourished, well developed, and in no acute  distress.  HEENT:  Normocephalic and atraumatic.  Pupils equal, round, and reactive  to light.  She has dry mucous membranes.  NECK:  Supple.  No JVD.  No carotid bruits.  LUNGS:  Clear to auscultation bilaterally without wheezes, rhonchi, or  rales.  CARDIOVASCULAR:  Regular rate and rhythm without murmurs, gallops, or  rubs.  She does have some chest tenderness with palpation along the mid  chest/sternum.  ABDOMEN:  Soft, nontender, and nondistended.  GU AND RECTAL:  Deferred.  EXTREMITIES:  No clubbing, cyanosis, or edema.  She does have right-  sided calf tenderness.  NEUROLOGIC:  The patient is alert and oriented.  Cranial nerves and  sensorimotor exam are intact.  PSYCHIATRIC:  Normal affect.  SKIN:  No rash.   LABS:  CBC is significant for hemoglobin of 11.1, hematocrit of 34.6,  MCV is 74, otherwise unremarkable.  INR is normal.  Complete metabolic  panel significant for a glucose of 126, otherwise unremarkable.  BNP and  cardiac enzymes negative.  EKG shows normal sinus rhythm.  Chest x-ray  shows stable mild cardiomegaly and mild changes of COPD.   ASSESSMENT/PLAN:  1. Chest pain and dyspnea:  The  patient does have risk factors for      both heart disease and pulmonary embolus.  I will get a CT      angiogram of the chest to rule out PE.  I will also get serial      cardiac enzymes.  She does have a reproducible component, but      cannot really tell me whether this is the same pain that she has      experienced previously.  I will try a dose of Toradol.  She may      need a stress test.  I will also check amylase and lipase as there      is some radiation to the back.  2. Diabetes.  Continue outpatient medications.  3. Hypertension.  Continue outpatient medications.  4. Gastroesophageal reflux disease.  Continue proton pump inhibitor.  5. Anxiety.  Currently, the patient is quite calm, but I will order      p.r.n. alprazolam.  6. Hyperlipidemia, intolerant of STATINS.  I will check fasting lipids      in the morning.  7. Peripheral neuropathy.      Corinna L. Lendell Caprice, MD  Electronically Signed     CLS/MEDQ  D:  08/26/2007  T:  08/26/2007  Job:  [id number]   cc:   Tally Joe, M.D.

## 2010-06-15 NOTE — Op Note (Signed)
[name], [name] NO.:  [id number]   MEDICAL RECORD NO.:  [id number]          PATIENT TYPE:  INP   LOCATION:  3730                         FACILITY:  [facility]   PHYSICIAN:  Madelynn Done, MD  DATE OF BIRTH:  Sep 26, 1932   DATE OF PROCEDURE:  08/29/2007  DATE OF DISCHARGE:  08/29/2007                               OPERATIVE REPORT   PREOPERATIVE DIAGNOSIS:  Right hand compartment syndrome.   POSTOPERATIVE DIAGNOSIS:  Right hand compartment syndrome.   ATTENDING SURGEON:  Sharma Covert IV, MD, who has scrubbed and was  present for the entire procedure.   ASSISTANT SURGEON:  None.   SURGICAL PROCEDURE:  Delayed primary wound closure of right hand,  fasciotomy wounds, volar and dorsal.   ANESTHESIA:  General via LMA.   TOURNIQUET TIME:  0 minutes.   DRAINS:  None.   INTRAOPERATIVE FINDINGS:  The patient's hand looked dramatically better.  There was wrinkles to her skin.  Her fingers were soft.  She had good  perfusion of the fingers.  The skin was in good condition, is felt  amenable to delayed closure.   SURGICAL INDICATIONS:  [name] is a 77 year old female who sustained  an extravasation injury to her right hand on August 26, 2007.  The patient  was taken emergently to the operating room to undergo fasciotomies of  the hand for compartment syndrome.  She tolerated this well and she was  scheduled for return to the operating room for delayed closure today.  Risks, benefits, and alternatives were discussed in detail with the  patient and signed informed consent was obtained.   DESCRIPTION OF PROCEDURE:  The patient was properly identified in the  preoperative holding area, mark with a permanent marker was made on the  right hand to indicate correct operative site.  The patient was then  brought back to the operating room and placed supine on the anesthesia  room table and general anesthesia was administered via LMA.  The patient  received  preoperative antibiotics prior to any skin incision.  A well-  padded tourniquet was then placed on the right brachium and sealed with  1000 drape.  The right upper extremity was then prepped with Hibiclens  and then sterilely draped.  A time-out was called, the correct site was  identified, and the procedure was then begun.  Attention was then turned  to the volar wounds where the previous sutures were all removed.  A  thorough irrigation of the wounds was then carried out with saline  irrigation.  Following this closure of the wounds on both the carpal  tunnel incision and the hypothenar and thenar wounds were then closed  with horizontal mattress and simple 4-0 nylon sutures.  Attention was  then turned dorsally where the sutures were removed.  The wounds were  then thoroughly irrigated and then the skin closed with 4-0 nylon simple  and horizontal mattress sutures.  Adaptic dressing was then applied.  A  sterile compressive dressing was then applied and a bulky hand dressing  was applied.  The patient was then  extubated and taken to recovery room  in good condition.  The patient tolerated the procedure well.   POSTOPERATIVE PLAN:  The patient would be allowed to be discharged to  home.  She will be seen back in the office in 9 days for wound check and  likely suture removal and then continue with gradual increase the use of  the hand as she tolerates.      Madelynn Done, MD  Electronically Signed     FWO/MEDQ  D:  08/29/2007  T:  08/30/2007  Job:  [id number]

## 2010-06-15 NOTE — Discharge Summary (Signed)
[name], [name]               ACCOUNT NO.:  [id number]   MEDICAL RECORD NO.:  [id number]          PATIENT TYPE:  INP   LOCATION:  3730                         FACILITY:  [facility]   PHYSICIAN:  Corinna L. Lendell Caprice, MDDATE OF BIRTH:  Aug 23, 1932   DATE OF ADMISSION:  08/26/2007  DATE OF DISCHARGE:                               DISCHARGE SUMMARY   DISCHARGE DIAGNOSES:  1. Chest pain and dyspnea, resolved.  2. Intravenous contrast extravasation with resulting compartment      syndrome of the right hand.  3. Status post right hand decompressive fasciotomy, dorsal and volar      and right hand carpal tunnel release on August 26, 2007.  4. Incision and drainage and delayed primary closure of the right hand      on August 29, 2007, by Dr. Melvyn Novas.  5. Diabetes.  6. Hypertension.  7. Anxiety.  8. Hyperlipidemia.  9. History of cerebral aneurysm, status post coiling.  10.Gastroesophageal reflux disease.  11.Peripheral neuropathy.  12.History of migraines.  13.Reported STATIN intolerance.   DISCHARGE MEDICATIONS:  1. Oxycodone 5 mg p.o. q.4-6 h. p.r.n. pain.  2. She may resume her aspirin 81 mg a day.  3. Januvia 50 mcg a day.  4. Atenolol 50 mg a day.  5. Aciphex 20 mg a day.  6. Aspirin 81 mg a day.  7. Xanax 0.5 mg a day as needed for anxiety.  8. Trandolapril 4 mg a day.  9. Multivitamin a day.   CONDITION:  Stable.   CONSULTATIONS:  Madelynn Done, M.D.   PROCEDURES:  See above.   Diet should be low salt, low cholesterol diabetic.   CONDITION:  Stable.   ACTIVITY:  She is to keep her right hand splint clean and dry and keep  her right arm elevated.  No driving until cleared by Dr. Melvyn Novas.  Follow up with Dr. Melvyn Novas in 9 days.  Follow up with Dr. Tally Joe  in 2-4 weeks.   LABS:  CBC significant for a hemoglobin of 11.1, hematocrit 34, MCV is  74, INR 1.0.  Complete metabolic panel significant for a glucose of 126  otherwise unremarkable.  Amylase, lipase  normal.  Serial cardiac enzymes  negative.  LDL 111, HDL 24, triglycerides 82, cholesterol 151.   SPECIAL STUDIES/RADIOLOGY:  EKG showed normal sinus rhythm.  Chest x-ray  showed diffuse osteopenia, stable mild cardiomegaly, and changes of  COPD.   HISTORY AND HOSPITAL COURSE:  [name] is a pleasant 77 year old  white female who presented with atypical chest pain.  The day of  admission, she woke up with sudden onset of chest pressure that radiated  to her back.  She felt short of breath.  She thought she might be  suffering from a panic attack and therefore took a Xanax, but had no  relief.  She also had paresthesias all over her body.  She had a cardiac  catheterization in 2002 which showed clean coronaries and dilatation of  the aortic root.  She thought that she had had a stress test since then  but as far as  I can tell, she has not, although I have not tried to get  records from [facility].  She had no shortness of  breath by the time I examined her in the emergency room.  She was not  sure whether this was related to her acid reflux.  She had normal vital  signs and was in no acute distress.  She had dry mucous membranes.  She  did have some chest wall tenderness with palpation but was unable to  vocalize whether this reproduced the pain or not.  The Toradol, however,  did relieve the pain, and she had no recurrence of shortness of breath  or chest pain.  She had had several recent long car trips.  She was  admitted to telemetry and ruled out for MI.  A CT angiogram of the chest  was ordered to evaluate her for pulmonary embolus or other chest  pathology.  Unfortunately, she had extravasation of the IV contrast and  developed the compartment syndrome.  She was evaluated by the on-call  radiologist who called a stat consult to Dr. Melvyn Novas.  Dr. Melvyn Novas took  her emergently to the operating room for fasciotomy.  The patient had  negative Dopplers for DVT.   She also had an echocardiogram which showed  good ejection fraction and no major valvular disease.  She did not want  V/Q scan, CAT scan, or any other workup of her original chief complaint  which is entirely understandable.  The patient has been cleared for  discharge by Dr. Melvyn Novas, and he will see here back in the office.  Any  further workup can be done as an outpatient per Dr. Azucena Cecil.      Corinna L. Lendell Caprice, MD  Electronically Signed     CLS/MEDQ  D:  08/29/2007  T:  08/30/2007  Job:  [id number]   cc:   Tally Joe, M.D.  Madelynn Done, MD  Thereasa Solo Little, M.D.  Dr. Beckie Salts

## 2010-06-18 NOTE — Consult Note (Signed)
[name], [name] NO.:  [id number]   MEDICAL RECORD NO.:  [id number]          PATIENT TYPE:  INP   LOCATION:  1411                         FACILITY:  [facility]   PHYSICIAN:  Genene Churn. Love, M.D.    DATE OF BIRTH:  [birth year]   DATE OF CONSULTATION:  09/13/2004  DATE OF DISCHARGE:                                   CONSULTATION   REASON FOR CONSULTATION:  This 77 year old, right-handed, white, married  female is seen in the emergency room for evaluation of numbness with a  history of chest pain and shortness of breath.   HISTORY OF PRESENT ILLNESS:  [name] awoke about 3 a.m. the morning of  September 13, 2004, with chest pain and shortness of breath.  This was  accompanied by paresthesias or numbness involving her entire body including  her eyes and her teeth and her ears.  It lasted approximately 2 hours and  she came to the [facility] Emergency Room.  She did take a Xanax  prior to coming to the emergency room and also a nitroglycerin.  She has a  history of peripheral neuropathy diagnosed by Dr. Porfirio Mylar Dohmeier in the  past and suspected to be on the basis of diabetes mellitus.  She has also  had a known history of headaches, though to be migraines, and has been found  to have two unruptured aneurysm.  There was a right ICA aneurysm with failed  coiling in December 2003, but successful coiling on April 04, 2002, and a  left periophthalmic aneurysm which has not been coiled.  She has had  symptoms of congestive heart failure, chest pain and underwent cardiac  catheterization on August 17, 2000, with normal coronary arteries.  At that  time, she mentions that she had mitral valve prolapse.  She has had a  history of hypertension and hyperlipidemia with palpitations as well as  migraine.   MEDICATIONS:  1.  Lasix 20 mg p.o. daily.  2.  Atenolol 50 mg daily.  3.  Plavix 75 mg daily.  4.  Folic acid 1 mg daily.  5.  Aciphex 20 mg daily.  6.  Lipitor 20  mg daily.  7.  Xanax 1/2 of 0.5 mg tablet b.i.d. p.r.n. for insomnia or anxiety.   SOCIAL HISTORY:  She does not smoke cigarettes.  She does not drink alcohol.  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   ALLERGIES:  PENICILLIN.  ASPIRIN causes nosebleed.   PHYSICAL EXAMINATION:  GENERAL:  Well-developed, white female.  VITAL SIGNS:  Blood pressure in right left arm 180/90, heart rate 82 and  regular.  No bruits.  NECK:  Supple.  NEUROLOGIC:  Alert and oriented x3.  Followed one, two and three step  commands.  Cranial nerve examination revealed visual fields to be full.  Both discs were seen and flat.  She was status post cataract surgery  bilaterally.  Corneals were present.  Hearing was decreased.  Air conduction  was greater than bone conduction.  Tongue was midline and uvula midline.  Gag was present.  Sternocleidomastoid and Trapezius testing were normal.  Motor examination revealed 5/5 strength proximally and distally in the upper  and lower extremities.  She had an outstretched hand and arm tremor.  Good  finger-to-nose.  Good heel-to-shin.  Sensory examination was intact to pin  prick.  She had joint position which was intact.  Decreased vibration.  Reflexes 2+ in lower extremities except for absent ankle jerks and downgoing  plantar responses.  She had an outstretched hand and arm tremor.   IMPRESSION:  1.  Paresthesias. (782.0)  2.  Peripheral neuropathy. (357.2)  3.  Insomnia. (780.52)  4.  Diabetes mellitus. (250.60)  5.  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 (437.3)  6.  Essential tremor. (333.1)   RECOMMENDATIONS:  1.  Obtain a Vitamin B12 level.  2.  Start her on Lyrica 50 mg t.i.d. for her discomfort in her legs.           ______________________________  Genene Churn. Sandria Manly, M.D.     JML/MEDQ  D:  09/13/2004  T:  09/13/2004  Job:  [id number]

## 2010-06-18 NOTE — Consult Note (Signed)
[name], [name]               ACCOUNT NO.:  [id number]   MEDICAL RECORD NO.:  [id number]          PATIENT TYPE:  OUT   LOCATION:  XRAY                         FACILITY:  [facility]   PHYSICIAN:  Sanjeev K. Deveshwar, M.D.DATE OF BIRTH:  11/28/1932   DATE OF CONSULTATION:  12/09/2005  DATE OF DISCHARGE:  12/09/2005                                   CONSULTATION   CHIEF COMPLAINT:  History of cerebral aneurysms.   HISTORY OF PRESENT ILLNESS:  This is a very pleasant 77 year old female who  was found to have two cerebral aneurysms in December 2003.  An initial  attempt at coiling one of the aneurysms at that time was unsuccessful.  The  patient returned on April 04, 2002 at which time the right internal carotid  artery aneurysm was successfully coiled by Dr. Corliss Skains.  The patient has a  residual left periophthalmic artery aneurysm which was felt to be stable and  was not treated at that time.  She had a cerebral angiogram performed  February 09, 2004.  At that time, the right internal carotid artery aneurysm  showed no evidence of compaction or recannulization and the left  periophthalmic artery aneurysm which measured 4.3 mm x 4.2 mm also appeared  to be stable.   Recently, the patient has developed symptoms of headaches around her left  eye and dizziness.  She had an MRA of the brain and head and neck performed  December 01, 2005.  She returns today to discuss that study with Dr.  Corliss Skains.  She is accompanied by her husband.   PAST MEDICAL HISTORY:  1. Significant for diabetes mellitus.  2. Hyperlipidemia.  3. Hypertension.  4. Previous congestive heart failure.  5. She had a cardiac catheterization performed by Dr. Julieanne Manson on      July 2002 that apparently was normal with a normal left ventricle as      well.  6. She has a history of TIAs.  She had a 2-D echo performed in 2004.      Report is not available but the patient stated she had an enlarged      heart with mitral  valve prolapse.  7. She has history of palpitations.  8. She has peripheral neuropathy related to her diabetes.  She has been      seen by Dr. Bud Face for peripheral vascular disease.  After      evaluation, she was told that she did not have any significant vascular      disease of the lower extremities.  9. She has a history of migraine headaches.   SOCIAL HISTORY:  The patient is married.  She has no history of alcohol or  tobacco use.   ALLERGIES:  PENICILLIN.   IMPRESSION AND PLAN:  As noted, the patient has a history of cerebral  aneurysms, a right internal carotid artery aneurysm was successfully coiled  in March 2004.  She has a residual left periophthalmic artery aneurysm which  has been followed.  An MRA was performed on December 01, 2005.  This showed  mild atrophy and chronic  microvascular ischemic changes without acute  intracranial findings.  There were bilateral mastoid effusions, the right  being greater than the left, which were felt to be stable.  There was no  evidence of recannulization of the right internal carotid artery aneurysm.  The left periophthalmic artery aneurysm appeared to be stable.  As noted,  the patient has had some dizziness and some headaches around her left eye.  Dr. Corliss Skains reviewed the results of the recent study with the patient and  her husband.  Again he felt everything appeared stable.  He recommended no  change in therapy at this time.  He felt that an MRI/MRA should be repeated  in approximately 1 year.  The patient was told to contact us if she had any  significant change in her symptoms.  Greater than 15 minutes was spent on  this consult.      [name], P.A.    ______________________________  Grandville Silos. Corliss Skains, M.D.    DR/MEDQ  D:  12/12/2005  T:  12/12/2005  Job:  [id number]   cc:   Melvyn Novas, M.D.  Tally Joe, M.D.

## 2010-06-18 NOTE — H&P (Signed)
[name], Black NO.:  [id number]   MEDICAL RECORD NO.:  [id number]          PATIENT TYPE:  EMS   LOCATION:  ED                           [facility]ACILITY[facility]   PHYSICIAN:  Theone Stanley, MD   DATE OF BIRTH:  [birth year]   DATE OF ADMISSION:  09/13/2004  DATE OF DISCHARGE:                                HISTORY & PHYSICAL   CHIEF COMPLAINT:  Chest pain.   HISTORY OF PRESENT ILLNESS:  [name] is a 77 year old Caucasian female  who has a history of peripheral neuropathy, diabetes - diet controlled,  hyperlipidemia, hypertension, TIAs, migraines, and history of anemia  presenting with chest pain and shortness of breath. After talking with the  patient, apparently she gets intermittent chest pain off and on for the past  3 years. The difference that brought her in today was the fact that she had  increasing shortness of breath and what she stated was numbness all over  including her eyes and her ears. The chest that she describes which is  ongoing over the past 3 years is heavy, constant, substernal. This morning,  when she woke up with the chest pain which apparently woke her up, she also  had some left arm pain. This was at 3 a.m. in the morning. When she got up  to go to the bathroom she became very short of breath. She took a Xanax at  that time but it did not help. Currently, the patient only received oxygen  and this has made her breathing much better and also chest pain has  decreased. Currently, the patient continues to have this pressure-type pain  but again is decreased in intensity. Her pain increases with exertion.  However, again, this is not new. The patient intermittently becomes short of  breath with this chest pain for the past 3 years. However, this episode of  shortness of breath that she had last night was enough to bring her in. This  is a sudden onset. Apparently she typically has chest pressure/pain at least  1-2 times a month but not  as severe. She states that sitting up in her chair  or sitting up in the bed helps with her chest pain and shortness of breath.  She has peripheral edema; however, this has not increased in amount. She  also has not noticed any increase in weight over the past week. She states  that she has been seen by a cardiologist in the past. In looking at her old  records, she had a cardiac catheterization in 2002 by Dr. Clarene Duke which found  to have normal coronaries and normal left ventricular function at that time.  She then subsequently was seen by a cardiologist at [facility] - she cannot  remember the name - in January 2004, had an echocardiogram. She states that  she was informed that she had an enlarged ventricle and some valvular issue.  She was placed on Lasix at that time. She did not follow up with any  cardiologist since that time. She is followed by Dr. Corliss Skains for a brain  aneurysm.  She had an aneurysm coiled in December 2003. This was unsuccessful  secondary to progressive heart failure. It was repeated in March 2004  without any difficulty. She recently was seen by Dr. Corliss Skains in January,  had an angiogram, and there were no major changes with her aneurysm.   PAST MEDICAL HISTORY:  1.  Peripheral neuropathy.  2.  Diabetes, diet controlled. Currently, the patient states her blood      sugars range from 90 to 140.  3.  Hyperlipidemia.  4.  Hypertension.  5.  TIAs.  6.  History of migraines.  7.  History of anemia.   MEDICATIONS:  1.  Lasix 20 one p.o. daily.  2.  Atenolol 50 mg one p.o. daily.  3.  Mavik 4 mg one p.o. daily.  4.  Plavix 75 mg one p.o. daily.  5.  AcipHex 20 mg one p.o. daily.   ALLERGIES:  PENICILLIN - the patient has swelling with that.   FAMILY HISTORY:  Significant for diabetes, heart disease. The patient had a  brother who died at age 80 from an MI.   SOCIAL HISTORY:  The patient lives in [place], is married, has two step-  children. No tobacco,  alcohol, or illicit drug use.   PHYSICAL EXAMINATION:  VITAL SIGNS:  Temperature of 96.4, blood pressure  154/71, pulse of 66, respiratory rate of 24, saturating 100%.  HEENT:  Head atraumatic, normocephalic. Eyes:  Pupils reactive to light.  Extraocular movements intact. Ears:  No known discharge. Throat:  Clear. No  erythema, no exudate. Mucosa was actually somewhat dry.  NECK:  Supple. No lymphadenopathy.  HEART:  Regular rate and rhythm. No murmur, rubs, or gallops appreciated.  LUNGS:  Clear to auscultation bilaterally.  ABDOMEN:  Soft, nontender, nondistended.  EXTREMITIES:  No edema, cyanosis, or clubbing appreciated. The patient did  mention she had some inner thigh pain. She thinks it is veins. However, upon  inspection there is no evidence of probable phlebitis or other physical  findings.   LABORATORY DATA AND RADIOLOGY:  The patient had an electrocardiogram which  showed normal sinus rhythm with no change since her previous EKG. Cardiac  markers were all negative. Chest x-ray showed mild cardiomegaly with no  acute changes. Sodium 138, potassium 4, chloride at 104, CO2 of 23, glucose  at 117, BUN at 15, creatinine at 0.7, calcium 9.2. Urine showed many  epithelial, insignificant white cells. D-dimer was less than 0.22.   ASSESSMENT AND PLAN:  A 77 year old presenting with atypical chest pain.   1.  Atypical chest pain. At this point in time, based on the information      available, this is less likely to be cardiac. However, because of her      history of CHF and the fact that she has seen an [place] cardiologist, I      will consult them, obtain a 2-D echocardiogram, add a beta natriuretic      peptide level to see if this might be the cause, increase her proton      pump inhibitor, and see what further workup cardiology would suggest.  2.  Cerebral aneurysm. The patient recently was seen by Dr. Corliss Skains and     angiogram was performed which did not show any significant  changes. I      did call Dr. Corliss Skains to discuss the case. He did not feel that this      paresthesias that she was having had anything to do with her  aneurysm.  3.  Generalized paresthesias. The patient stated that she is numb all over.      That has resolved and now she has tingling all over. I am not sure what      exactly is causing this. I did call the neurologist who has seen the      patient in the past to see they would advise further testing for this      issue.  4.  Hyperlipidemia. Will continue the [facility]  5.  Hypertension. Will continue her atenolol.  6.  Migraines. Based on her previous records, apparently Tylenol works for      her. I will order this.  7.  Diabetes. She states it is diet controlled. She has tried medications in      the past. However, this has caused her to become hypoglycemic on      multiple occasions and that is why she is not currently on medications      for this. I will check a hemoglobin A1c to see her trend.      Theone Stanley, MD  Electronically Signed     AEJ/MEDQ  D:  09/13/2004  T:  09/13/2004  Job:  [id number]   cc:   Genene Churn. Love, M.D.  [address]  [address]  [place]  [place] [address]  Fax: [phone]   Meade Maw, M.D.  [address]  [place]  [place] [address]  Fax: [phone]   Tally Joe, M.D.  Fax: [phone]   Sanjeev K. Corliss Skains, M.D.  [address] [place]  [place]  [place] [address]  Fax: [phone]

## 2010-06-18 NOTE — Cardiovascular Report (Signed)
[place]  Patient:    [name], [name]                      MRN: [id number] Proc. Date: 08/17/00 Adm. Date:  [id number] Attending:  Loreli Dollar CC:         Willis Modena. Dreiling, M.D.  Cardiac Catheterization Laboratory   Cardiac Catheterization  PROCEDURES: 1. Left heart catheterization. 2. Selective right and left coronary arteriography. 3. Ventriculography in the right anterior oblique projection. 4. Aortic root injection.  EQUIPMENT:  A 6 French Judkins configuration catheter.  COMPLICATIONS:  None.  TOTAL CONTRAST:  90 cc.  INDICATIONS FOR PROCEDURE:  The patient is a 77 year old female, who was admitted with chest pain, has a normal ECG but had low positive troponins. She continued to have chest pain despite nitroglycerin and IV heparin, and is brought to the catheterization lab.  DESCRIPTION OF PROCEDURE:  The patient was prepped and draped in the usual sterile fashion exposing the right groin.  Following local anesthetic with 1% Xylocaine, the Seldinger technique was employed and a 6 [place] introducer sheath was placed in the right femoral artery.  Selective right and left coronary arteriography and ventriculography in the RAO projection was performed.  She was given 1 mg of Versed and 2 mg of Nubain.  At the termination of the procedure, she was given 20 mg of IV labetalol for blood pressure elevation.  RESULTS: 1. Hemodynamic monitoring:  Central aortic pressure 192/91, left    ventricular pressure 187/32.  There was no aortic valve gradient    noted at the time of pullback. 2. Ventriculography:  Ventriculography in the RAO projection revealed    normal left ventricular systolic function.  Ejection fraction 62%.    End-diastolic pressure was 34.  Aortic root appeared to be dilated.  Aortic root injection showed no evidence of aortic insufficiency, dilatation of the ascending aorta, but smooth appearing walls with no  evidence of a dissection flap.  CORONARY ARTERIOGRAPHY:  There was a stent noted on fluoroscopy. 1. Left main:  Normal. 2. LAD.  The LAD crossed the apex of the heart.  The distal LAD was    relatively small in diameter and the first diagonal was medium sized.    This entire system was free of significant disease. 3. Circumflex:  The circumflex gave rise to one large obtuse marginal vessel.    This system was free of disease. 4. Right coronary artery:  The right coronary artery was a large dominant    vessel giving rise to a large PDA and two smaller posterolateral    branches.  This system was all free of disease.  CONCLUSIONS: 1. No epicardial coronary disease. 2. Normal left ventricular systolic function. 3. Elevation of the left ventricular end-diastolic pressure of 34. 4. Dilatation of the aortic root.  At this point, I cannot explain her chest pain with the slight elevation of her troponins.  I have ordered a spiral CT of her chest to make sure she does not have an aortic dissection or pulmonary embolus.  In addition to this, I have ordered lower extremity Dopplers because she does have a tender right calf but what feels like a superficial thrombus.  Her heparin has been placed on hold because of the aortic root dilatation until the CT of the chest is performed. DD:  08/17/00 TD:  08/17/00 Job: [id number]

## 2010-06-18 NOTE — Consult Note (Signed)
[name], [name]               ACCOUNT NO.:  [id number]   MEDICAL RECORD NO.:  [id number]          PATIENT TYPE:  OUT   LOCATION:  XRAY                         FACILITY:  [facility]   PHYSICIAN:  Sanjeev K. Deveshwar, M.D.DATE OF BIRTH:  08-24-1932   DATE OF CONSULTATION:  06/08/2004  DATE OF DISCHARGE:                                   CONSULTATION   CHIEF COMPLAINT:  Known cerebral aneurysm.   HISTORY OF PRESENT ILLNESS:  This is a very pleasant 77 year old female with  a long history of headaches who was found to have several aneurysms.  An  initial attempt was made at coiling a right internal carotid artery aneurysm  in December of 2003; however, the procedure was complicated by congestive  heart failure-like symptoms and the procedure was unsuccessful.  The patient  eventually underwent coiling of the right internal carotid artery aneurysm  on April 04, 2002 performed by Dr. Corliss Skains.  She was also noted to have an  aneurysm on the left side as well.  The patient has been followed closely  over the years.  Her most recent angiogram was performed February 09, 2004  secondary to ongoing headaches.  The right internal carotid artery aneurysm  showed no evidence of compaction or recannulization.  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  As noted, the patient does have occasional  headaches.  However, these are relieved by Tylenol.  She does have some  dizziness; however, this sounds like orthostasis as she becomes dizzy  whenever she stands.  She is on Lasix and she does have a history of  diabetes.  We have recommended that she follow up with her primary care  physician to rule out dehydration as a possible cause or possibly iatrogenic  hypotension.   PAST MEDICAL HISTORY:  1.  Diabetes mellitus which is diet controlled.  2.  Hyperlipidemia.  Her primary care physician recently spoke to her about  starting Lipitor.  She has been hesitant to do so.  However, after      talking with Dr. Corliss Skains today she has agreed to at least try the      Lipitor.  3.  She also has a history of hypertension.  4.  There is some question of congestive heart failure symptoms in the past.      She had a cardiac catheterization performed by Dr. Julieanne Manson August 17, 2000 for evaluation of chest pain.  She was found to have normal      coronary arteries and normal left ventricle at that time.  A chest CT      was also performed to rule out pulmonary embolism.  5.  The patient has had a history of TIAs.  She had a 2-D echocardiogram      performed at [facility] in early 2004.  We do not have the report      on this study; however, the patient tells me she had an enlarged heart  with mitral valve prolapse.  She does have a history of palpitations.  6.  The patient also has a history of peripheral neuropathy which has been      quite bothersome for her.  This has been evaluated by Dr. Vickey Huger.  She      was also evaluated by Dr. Liliane Bade to rule out peripheral vascular      disease.  Dr. Madilyn Fireman told the patient that she does not have any      significant vascular disease in the lower extremities and he feels that      her symptoms are all due to peripheral neuropathy which is most likely      related to her diabetes.  7.  She has a history of migraine headaches; however, she has not been      bothered by any severe headaches recently.   CURRENT MEDICATIONS:  Atenolol, Lasix, Mavik, Plavix, Aciphex.  Dosages are  not available at this time.  She does not take aspirin secondary to frequent  nose bleeds.  As noted, her primary care physician recently recommended  Lipitor.   SOCIAL HISTORY:  The patient is married.  She is accompanied by her husband  today.  She has no history of tobacco or alcohol use.   IMPRESSION:  1.  Previous coiling of a right internal carotid artery aneurysm  performed      April 04, 2002 by Dr. Corliss Skains with no evidence of recannulization.  2.  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  3.  History of mild headaches.  4.  History of dizziness, question orthostasis.  5.  History of diabetes, diet controlled.  6.  Hyperlipidemia to be treated with Lipitor.  7.  History of hypertension.  8.  History of congestive heart failure symptoms.  9.  Normal cardiac catheterization performed in July of 2002 by Dr. Clarene Duke.  10. History of transient ischemic attacks.  11. History of palpitations.  12. History of migraine headaches.  13. Peripheral neuropathy related to her diabetes.  14. History of a 2-D echocardiogram in early 2004.  Results are not      available.   PLAN:  The patient and her husband had a long discussion with Dr. Corliss Skains  regarding the possible treatment options for her cerebrovascular disease.  They question whether or not the remaining aneurysm should be coiled.  However, Dr. Corliss Skains reassured them there has been no increase in the size  of the aneurysm and the patient has had no new neurological symptoms.  He  felt that she was stable and this aneurysm could be followed.  He  recommended a repeat MRI/MRA to be performed in January of 2007 to look for  any new changes in the aneurysms.  Barring any new onset of neurological  symptoms, the plan will be to follow the aneurysm and to treat  conservatively.  We will plan to see the patient back in January of 2007  after her next MRI/MRA.  We have told her to contact Dr. Corliss Skains or Dr.  Vickey Huger or her primary care physician immediately if she has any new onset  of neurological symptoms.      DR/MEDQ  D:  06/08/2004  T:  06/08/2004  Job:  [id number]   cc:   Melvyn Novas, M.D.  [address]  [address]  [place]  [place] [address]  Fax: [phone]   Tally Joe, M.D. [address]  Laurell Josephs [address] [place] [address]  Fax: [phone]

## 2010-06-18 NOTE — Discharge Summary (Signed)
[place]  Patient:    [name], [name]                      MRN: [id number] Adm. Date:  [id number] Disc. Date: [id number] Attending:  Loreli Dollar Dictator:   Adrian Saran, N.P. CC:         Willis Modena. Dreiling, M.D.   Discharge Summary  ADMISSION DIAGNOSES: 1. Chest pain, rule out myocardial infarction. 2. Hypertension. 3. History of transient ischemic attack. 4. History of palpitations.  DISCHARGE DIAGNOSES: 1. Chest pain, resolved, negative for myocardial infarction. 2. Anemia. 3. Hypertension. 4. Hyperlipidemia. 5. History of transient ischemic attack.  PROCEDURES:  Cardiac catheterization.  COMPLICATIONS:  None.  DISCHARGE STATUS:  Stable.  ADMISSION HISTORY:  This is a 77 year old female who had sudden onset of left anterior chest pain that radiated between her shoulder blades.  She also had accompanying shortness of breath and left arm "numbness".  Chest pain occurred last week while she was asleep and caused her a significant amount of orthopnea.  States this lasted approximately two hours.  She sat up and her shortness of breath improved.  Since that episode, she states that she has had just generalized weakness and fatigue as well as breathlessness, even with slight minimal exertion.  Today, on August 17, 2000, she again was awakened from sleep with left anterior chest pain that radiated to between her shoulder blade, shortness of breath and bilateral arm numbness.  She is up in regards to the orthopnea and only got slight improvement in her shortness of breath. She states that it began about 4:00 a.m. on the morning of admission and has not gone away this time.  She did experience some nausea without vomiting after she got out of bed.  She described the chest pain as a "pressure sensation" and rated it an 8 out of 10.  She was brought to the [facility] Emergency Room by her husband, where she continued to have chest pain  and mild shortness of breath.  Pain at this point was 5/10; still had some nausea as well as upper arm extremity numbness.  She was given three sublingual nitroglycerin with some relief in her pain.  PHYSICAL EXAMINATION ON ADMISSION:  VITAL SIGNS:  Blood pressure 129/98, heart rate 57, respirations 20.  She was afebrile.  O2 saturation 100% on 2 L.  LABORATORY DATA:  EKG showed sinus bradycardia without any acute changes since her last tracing on April 11, 1996.  She had a normal CBC and BMP.  Total CK was 57 with 1.3 MBs.  Troponin 0.09.  Normal LFTs.  She was in a moderate amount of distress.  NECK:  No JVD noted.  LUNGS:  Lungs were clear.  HEART:  Regular rate and rhythm.  No murmur or gallop.  ABDOMEN:  Protuberant but soft.  Normal bowel sounds in all quadrants, no hepatosplenomegaly.  She did have a moderate amount of epigastric tenderness.  EXTREMITIES:  Right calf tenderness was noted +1 edema noted to lower extremities bilaterally.  Good PT pulses +2 bilaterally.  DP pulse +2 on the left, however, unable to palpate DP pulse on the right.  NEUROLOGIC:  Left focal neurological deficits.  HOSPITAL COURSE:  Intravenous nitroglycerin and heparin were started in the emergency room as well as aspirin p.o.  Serial cardiac enzymes and EKGs were ordered.  She was transferred to [facility] for diagnostic cardiac catheterization later that day.  Cardiac cath  performed, which showed a normal left main in the LAD. Circumflex had only minimal ostial narrowing, otherwise, it was okay.  Right coronary was okay.  She had normal LV function with an EF of 62%.  There was no cardiac etiology for her chest pain.  At this point, spiral CT of the chest was ordered to rule out PE, as well as aortic dissection.  Lower extremity Doppler was ordered secondary to difficulty in palpating pulses.  Later that evening and the next day, she developed a moderate amount of nausea and vomiting.   She also had a soreness noted to the anterior chest wall in a costal sternal junction on both left and right sides.  Denied any recurrence of her chest pain or shortness of breath.  Vital signs remained normal and repeat cardiac enzymes were negative.  CT scan was negative for PE.  There was no evidence of DVT by Doppler of the lower extremities.  Gallbladder ultrasound was ordered to evaluate her epigastric pain and nausea and vomiting.  This was done and it did not reveal any acute problems.  She had a slight drop in her hemoglobin and hematocrit and iron store studies were ordered, which were normal.  On August 19, 2000, the patient was discharged home.  DISCHARGE MEDICATIONS: 1. Atenolol 50 mg q.d. 2. Mavik 1 mg q.d. 3. Plavix 75 mg q.d. 4. Aciphex 20 mg b.i.d. 5. Vioxx 25 mg q.d.  DISCHARGE INSTRUCTIONS:  She is to be restricted from any strenuous activity for the next 2-3 days.  She is not to lift anything more than 5 pounds.  She is to follow a low-salt, low-fat diet.  Aciphex has been increased to b.i.d. to try and help with the epigastric pain.  It is fine for her to shower, but to gently wash her calf/groin site.  If she notices any increase in bruising, swelling or pain, she is to contact Dr. Clarene Black.  She is to follow up with Dr. Clarene Black 3-4 weeks after discharge.  She is to call for an appointment. DD:  09/12/00 TD:  09/12/00 Job: [id number]

## 2010-06-18 NOTE — Consult Note (Signed)
NAMEBRYNNAN, RODENBAUGH NO.:  [id number]   MEDICAL RECORD NO.:  [id number]          PATIENT TYPE:  INP   LOCATION:  0104                         FACILITY:  [facility]   PHYSICIAN:  Meade Maw, M.D.    DATE OF BIRTH:  [birth year]   DATE OF CONSULTATION:  09/13/2004  DATE OF DISCHARGE:                                   CONSULTATION   REFERRING PHYSICIAN:  Ranelle Oyster, M.D.   REASON FOR CONSULTATION:  Chest pain.   HISTORY OF PRESENT ILLNESS:  [name] is a 77 year old female who  presents of complaints of numbness and intermittent chest pain which has  been on and off for approximately 3 years.  She also notes shortness of  breath and heaviness in her left arm.  Of note, the patient has underwent a  left heart catheterization in 2002, by Dr. Caprice Kluver.  This revealed normal  coronaries with an ejection fraction of 62%.  Spiral CT scan was performed  at that time as well which eliminated pulmonary embolus and aortic  dissection.  She did have an episode of congestive heart failure in 2003.  Echocardiogram performed at that time revealed questionable mitral valve  prolapse.  Report currently is not available for review.  It was felt that  congestive heart failure may have been secondary to diastolic dysfunction.  Upon presentation to the emergency room, ECG was obtained and this revealed  normal sinus rhythm with no ST changes noted.   PAST MEDICAL HISTORY:  1.  Hypertension.  2.  Transient ischemic attacks.  3.  Palpitations.  4.  Dyslipidemia.  5.  Diet-controlled diabetes mellitus.  6.  Bilateral carotid artery aneurysm.  7.  Migraines.   MEDICATIONS:  1.  Lasix 20 mg daily.  2.  Atenolol 50 mg daily.  3.  Mavik 4 mg daily.  4.  Plavix 75 mg daily.  5.  Aciphex 20 mg daily.   ALLERGIES:  PENICILLIN which results in swelling.   FAMILY HISTORY:  Mother had congestive heart failure at an advanced age.  Brother passed from massive myocardial  infarction.  Brother with known  coronary disease, status post stents.   SOCIAL HISTORY:  No history of tobacco, alcohol or illicit drug use.   PHYSICAL EXAMINATION:  GENERAL:  Obese female in no acute distress.  She is  alert and oriented x3.  VITAL SIGNS:  Blood pressure 154/71, heart rate 60, afebrile.  HEENT:  Unremarkable.  NECK:  No carotid bruits were noted.  No neck vein distention was noted.  LUNGS:  Pulmonary exam reveals breath sounds which are equal and clear to  auscultation.  HEART:  Regular rate and rhythm with normal S1, S2.  No murmurs, rubs or  gallops.  PMI not displaced.  ABDOMEN:  Soft, benign, nontender.  EXTREMITIES:  Pulses not palpable on right foot.  Decreased pulse in her  left.   LABORATORY DATA AND X-RAY FINDINGS:  Sodium 138, potassium 4.0, creatinine  0.7.  UA is negative.  Serial cardiac enzymes negative.  D-dimer less than  0.22.   Chest x-ray revealed  mild cardiomegaly.   RECOMMENDATIONS:  1.  This is a 77 year old female with a history of chronic chest pain,      cardiac catheterization in 2002.  The patient has had an extensive      cardiac workup.  At this time, she is currently on Plavix for continuous      Plavix at 75 mg daily for her transient ischemic attacks.  Would      consider gastroenterology evaluation and possible endoscopy for her      other etiologies of chest pain.  Would not proceed with further risk      stratification cardiac unless the patient is found to have positive      enzymes or ischemic changes on her electrocardiogram.  The likelihood of      developing significant plaque obstruction in 3 years is minimal.  2.  Hypertension.  Blood pressure is adequately controlled.  3.  Dyslipidemia.  The patient's LDL goal is less than 70.   Thank you for allowing me to consult on your patient.  I will discuss the  patient further with you.      Meade Maw, M.D.  Electronically Signed     HP/MEDQ  D:  09/13/2004  T:   09/13/2004  Job:  [id number]

## 2010-08-20 ENCOUNTER — Telehealth: Payer: Self-pay | Admitting: Internal Medicine

## 2010-08-20 NOTE — Telephone Encounter (Signed)
Pt requesting a sooner appt for abdominal pain. Pt scheduled to see Dr. Leone Payor 08/24/10@10 :30am. Pt aware of appt date and time.

## 2010-08-24 ENCOUNTER — Ambulatory Visit (INDEPENDENT_AMBULATORY_CARE_PROVIDER_SITE_OTHER): Payer: Medicare Other | Admitting: Internal Medicine

## 2010-08-24 VITALS — BP 136/90 | HR 60 | Ht 62.0 in | Wt 178.0 lb

## 2010-08-24 DIAGNOSIS — R109 Unspecified abdominal pain: Secondary | ICD-10-CM

## 2010-08-24 DIAGNOSIS — M545 Low back pain: Secondary | ICD-10-CM

## 2010-08-24 DIAGNOSIS — Z1273 Encounter for screening for malignant neoplasm of ovary: Secondary | ICD-10-CM

## 2010-08-24 DIAGNOSIS — R102 Pelvic and perineal pain: Secondary | ICD-10-CM

## 2010-08-24 DIAGNOSIS — N949 Unspecified condition associated with female genital organs and menstrual cycle: Secondary | ICD-10-CM

## 2010-08-24 DIAGNOSIS — R10819 Abdominal tenderness, unspecified site: Secondary | ICD-10-CM

## 2010-08-24 NOTE — Assessment & Plan Note (Signed)
Cause is not clear at this time. She has pain that responds to glycopyrrolate to think as her IBS or diverticula. This is a more chronic problem that at this point I think could be from the ovaries or perhaps coming from her spine. The CT of the abdomen and pelvis in February demonstrated a probable hemangioma of L3. I'm going to start the workup with a pelvic ultrasound and if that is unrevealing and even if it does show something she may need an MRI of the LS spine. I've explained this to her and she understands and agrees.

## 2010-08-24 NOTE — Patient Instructions (Addendum)
You have been scheduled for and Pelvic/Abdominal/Transvaginal Ultrasound vat [facility] Friday July 27 at 7:30. Please arrive at 7:15 am at the Radiology Department. Nothing to eat or drink after midnight the night before your procedure.

## 2010-08-24 NOTE — Progress Notes (Signed)
  Subjective:    Patient ID: [name], female    DOB: 1932-09-17, 77 y.o.   MRN: [id number]  HPI 77 yo ww with constant LLQ pain since 03/2010 or sooner. She had intense pain then with CT abd/pelvis showing diverticulosis. She saw [place] (Amy Esterwood, PA-C) and was treated with antibiotics - cipro and glycopyrrolate. Also told she had a UTI. She improved but pain never resolved. LLQ pain now but has it suprapubic and RLQ and into the back. Mostly in LLQ. Worse in AM. Increasing again. She saw PCP Azucena Cecil) and says UA negative though took sulfa Rx and thinks she got some worse. No bowel movement changes and no passing blood. Fells tired and her quality of life is overall poor. The low back hurts in the AM especially. It does not disturb sleep but uses chronic Xanax at bedtime, does have nocturia. Notices the pain when she gets up. Pain is not as significant as when she awakens, thinks she may be distracted by daily routine.  Glycopyrrolate helps colon spasms, but not the pain she is describing above.  She does have pain into her thighs at times, she has RLE chronic numbness and tingling s/p cerebral aneurysm repair (peripheral neuropathy).   She does note that she's under some stress with her husband who has mild dementia and has been traveling to the dermatologist [place] to a flare of his psoriasis.  Review of Systems As above    Objective:   Physical Exam [place] totally white woman, overweight to obese. She is in no acute distress. Back shows mild tenderness to palpation to the left of the lumbar spine over the pelvic wing. Abdomen soft somewhat diffusely tender in lower quadrants and periumbilical area but has a benign feel overall, no mass Neurologic shows that she has some weakness of the hip flexors bilaterally, she is able to depress her feet and withdrawal her feet with good strength. She has chronic diffuse sensation to light touch on the right foot though it's intact on the left. Her  knee reflexes are depressed bilaterally. There is no significant pain with straight leg raise or hip manipulation. She is able to squat and rise up without difficulty.   normal mood and affect     Assessment & Plan:

## 2010-08-24 NOTE — Assessment & Plan Note (Signed)
She's had problems with this. We know from 2008 MR of the LS spine at which point peripheral neuropathy was diagnosed she had degenerative disc disease. Felt that she had these problems then. Not sure what the L3 lesion is on the CT though they thought it was a hemangioma which is common. Await the pelvic ultrasound and then quite possible she will get an MRI of the LS-spine. I think she could have neuropathic pain affecting her abdomen.

## 2010-08-27 ENCOUNTER — Ambulatory Visit (HOSPITAL_COMMUNITY)
Admission: RE | Admit: 2010-08-27 | Discharge: 2010-08-27 | Disposition: A | Discharge status: Home / Self Care | Payer: Medicare Other | Source: Ambulatory Visit | Attending: Internal Medicine | Admitting: Internal Medicine

## 2010-08-27 ENCOUNTER — Other Ambulatory Visit: Payer: Self-pay | Admitting: Gastroenterology

## 2010-08-27 DIAGNOSIS — Z1273 Encounter for screening for malignant neoplasm of ovary: Secondary | ICD-10-CM

## 2010-08-27 DIAGNOSIS — N949 Unspecified condition associated with female genital organs and menstrual cycle: Secondary | ICD-10-CM | POA: Insufficient documentation

## 2010-08-27 DIAGNOSIS — R109 Unspecified abdominal pain: Secondary | ICD-10-CM | POA: Insufficient documentation

## 2010-08-27 DIAGNOSIS — R102 Pelvic and perineal pain: Secondary | ICD-10-CM

## 2010-08-27 DIAGNOSIS — Z90710 Acquired absence of both cervix and uterus: Secondary | ICD-10-CM | POA: Insufficient documentation

## 2010-08-29 ENCOUNTER — Encounter: Payer: Self-pay | Admitting: Internal Medicine

## 2010-08-29 DIAGNOSIS — R937 Abnormal findings on diagnostic imaging of other parts of musculoskeletal system: Secondary | ICD-10-CM | POA: Insufficient documentation

## 2010-08-29 NOTE — Progress Notes (Signed)
Quick Note:  Let her know these were ok. She needs an MRI of lumbar-sacral spine due to: 1) Low back pain 2) abnormal L3 on CT abd/pelvis - ? Hemangioma but she has the low back pain and LLQ pain so ? Some other problem  Please ask radiology -have the tech ask the MD if patient needs contrast - exactly how to order this (? Without vs, with and without) She may need BMET They can page me to discuss if needed but am thinking with and without due to possibility of tumor   ______

## 2010-08-30 ENCOUNTER — Telehealth: Payer: Self-pay

## 2010-08-30 NOTE — Telephone Encounter (Signed)
Patient advised she is scheduled for MRI with and without 09/07/10 1:00 [facility]

## 2010-08-30 NOTE — Telephone Encounter (Signed)
Message copied by Annett Fabian on Mon Aug 30, 2010 11:38 AM ------      Message from: Iva Boop      Created: Sun Aug 29, 2010  7:47 AM       Let her know these were ok.      She needs an MRI of lumbar-sacral spine due to:      1) Low back pain      2) abnormal L3 on CT abd/pelvis - ? Hemangioma but she has the low back pain and LLQ pain so ? Some other problem            Please ask radiology -have the tech ask the MD if patient needs contrast - exactly how to order this (? Without vs, with and without)      She may need BMET      They can page me to discuss if needed but am thinking with and without due to possibility of tumor

## 2010-09-06 ENCOUNTER — Emergency Department (HOSPITAL_COMMUNITY)
Admission: EM | Admit: 2010-09-06 | Discharge: 2010-09-06 | Disposition: A | Discharge status: Home / Self Care | Payer: Medicare Other | Attending: Emergency Medicine | Admitting: Emergency Medicine

## 2010-09-06 ENCOUNTER — Emergency Department (HOSPITAL_COMMUNITY): Payer: Medicare Other

## 2010-09-06 DIAGNOSIS — M79609 Pain in unspecified limb: Secondary | ICD-10-CM | POA: Insufficient documentation

## 2010-09-06 DIAGNOSIS — R079 Chest pain, unspecified: Secondary | ICD-10-CM | POA: Insufficient documentation

## 2010-09-06 DIAGNOSIS — R0602 Shortness of breath: Secondary | ICD-10-CM | POA: Insufficient documentation

## 2010-09-06 DIAGNOSIS — Z79899 Other long term (current) drug therapy: Secondary | ICD-10-CM | POA: Insufficient documentation

## 2010-09-06 DIAGNOSIS — E119 Type 2 diabetes mellitus without complications: Secondary | ICD-10-CM | POA: Insufficient documentation

## 2010-09-06 DIAGNOSIS — Z86718 Personal history of other venous thrombosis and embolism: Secondary | ICD-10-CM | POA: Insufficient documentation

## 2010-09-06 DIAGNOSIS — I10 Essential (primary) hypertension: Secondary | ICD-10-CM | POA: Insufficient documentation

## 2010-09-06 DIAGNOSIS — E669 Obesity, unspecified: Secondary | ICD-10-CM | POA: Insufficient documentation

## 2010-09-06 DIAGNOSIS — R209 Unspecified disturbances of skin sensation: Secondary | ICD-10-CM | POA: Insufficient documentation

## 2010-09-06 DIAGNOSIS — R0609 Other forms of dyspnea: Secondary | ICD-10-CM | POA: Insufficient documentation

## 2010-09-06 DIAGNOSIS — M7989 Other specified soft tissue disorders: Secondary | ICD-10-CM | POA: Insufficient documentation

## 2010-09-06 DIAGNOSIS — R0989 Other specified symptoms and signs involving the circulatory and respiratory systems: Secondary | ICD-10-CM | POA: Insufficient documentation

## 2010-09-06 DIAGNOSIS — F411 Generalized anxiety disorder: Secondary | ICD-10-CM | POA: Insufficient documentation

## 2010-09-06 LAB — DIFFERENTIAL
Basophils Absolute: 0 10*3/uL (ref 0.0–0.1)
Basophils Relative: 0 % (ref 0–1)
Lymphocytes Relative: 54 % — ABNORMAL HIGH (ref 12–46)
Neutro Abs: 4.2 10*3/uL (ref 1.7–7.7)
Neutrophils Relative %: 39 % — ABNORMAL LOW (ref 43–77)

## 2010-09-06 LAB — CBC
HCT: 35.7 % — ABNORMAL LOW (ref 36.0–46.0)
Hemoglobin: 11.4 g/dL — ABNORMAL LOW (ref 12.0–15.0)
MCHC: 31.9 g/dL (ref 30.0–36.0)
RBC: 4.68 MIL/uL (ref 3.87–5.11)
WBC: 10.7 10*3/uL — ABNORMAL HIGH (ref 4.0–10.5)

## 2010-09-06 LAB — BASIC METABOLIC PANEL
BUN: 18 mg/dL (ref 6–23)
Chloride: 104 mEq/L (ref 96–112)
GFR calc Af Amer: >60 mL/min (ref >60)
GFR calc non Af Amer: >60 mL/min (ref >60)
Potassium: 4.3 mEq/L (ref 3.5–5.1)
Sodium: 139 mEq/L (ref 135–145)

## 2010-09-06 LAB — CK TOTAL AND CKMB (NOT AT ARMC)
Relative Index: INVALID (ref 0.0–2.5)
Total CK: 56 U/L (ref 7–177)

## 2010-09-06 LAB — PROTIME-INR
INR: 1.03 (ref 0.00–1.49)
Prothrombin Time: 13.7 seconds (ref 11.6–15.2)

## 2010-09-06 LAB — APTT: aPTT: 26 seconds (ref 24–37)

## 2010-09-07 ENCOUNTER — Other Ambulatory Visit: Payer: Self-pay | Admitting: Internal Medicine

## 2010-09-10 ENCOUNTER — Ambulatory Visit (HOSPITAL_COMMUNITY)
Admission: RE | Admit: 2010-09-10 | Discharge: 2010-09-10 | Disposition: A | Discharge status: Home / Self Care | Payer: Medicare Other | Source: Ambulatory Visit | Attending: Internal Medicine | Admitting: Internal Medicine

## 2010-09-10 DIAGNOSIS — D1809 Hemangioma of other sites: Secondary | ICD-10-CM | POA: Insufficient documentation

## 2010-09-10 DIAGNOSIS — M47817 Spondylosis without myelopathy or radiculopathy, lumbosacral region: Secondary | ICD-10-CM | POA: Insufficient documentation

## 2010-09-10 DIAGNOSIS — M545 Low back pain, unspecified: Secondary | ICD-10-CM | POA: Insufficient documentation

## 2010-09-10 DIAGNOSIS — M79609 Pain in unspecified limb: Secondary | ICD-10-CM | POA: Insufficient documentation

## 2010-09-10 DIAGNOSIS — M5126 Other intervertebral disc displacement, lumbar region: Secondary | ICD-10-CM | POA: Insufficient documentation

## 2010-09-10 MED ORDER — GADOBENATE DIMEGLUMINE 529 MG/ML IV SOLN
20.0000 mL | Freq: Once | INTRAVENOUS | Status: AC | PRN
  Administered 2010-09-10: 20 mL via INTRAVENOUS

## 2010-09-11 NOTE — Progress Notes (Signed)
Quick Note:  Shows some mild degenerative disc disease and a hemangioma in spine - that is collection of blood vessels growing together that is not a problem Bottom line is cause of abdominal pain not seen on imaging  Could be a result of all stress she is under and functional abdominal pain Given diverticular problems I recommend a flex sig to evaluate LLQ pain or she can schedule REV to discuss things further ______

## 2010-09-27 ENCOUNTER — Other Ambulatory Visit: Payer: Medicare Other | Admitting: Internal Medicine

## 2010-09-28 ENCOUNTER — Ambulatory Visit: Payer: Medicare Other | Admitting: Internal Medicine

## 2010-10-06 ENCOUNTER — Emergency Department (HOSPITAL_COMMUNITY): Payer: Medicare Other

## 2010-10-06 ENCOUNTER — Observation Stay (HOSPITAL_COMMUNITY)
Admission: EM | Admit: 2010-10-06 | Discharge: 2010-10-07 | Disposition: A | Discharge status: Home / Self Care | Payer: Medicare Other | Source: Ambulatory Visit | Attending: Internal Medicine | Admitting: Internal Medicine

## 2010-10-06 DIAGNOSIS — R0602 Shortness of breath: Secondary | ICD-10-CM | POA: Insufficient documentation

## 2010-10-06 DIAGNOSIS — R110 Nausea: Secondary | ICD-10-CM | POA: Insufficient documentation

## 2010-10-06 DIAGNOSIS — I10 Essential (primary) hypertension: Secondary | ICD-10-CM | POA: Insufficient documentation

## 2010-10-06 DIAGNOSIS — I059 Rheumatic mitral valve disease, unspecified: Secondary | ICD-10-CM | POA: Insufficient documentation

## 2010-10-06 DIAGNOSIS — K219 Gastro-esophageal reflux disease without esophagitis: Secondary | ICD-10-CM | POA: Insufficient documentation

## 2010-10-06 DIAGNOSIS — K589 Irritable bowel syndrome without diarrhea: Secondary | ICD-10-CM | POA: Insufficient documentation

## 2010-10-06 DIAGNOSIS — R079 Chest pain, unspecified: Principal | ICD-10-CM | POA: Insufficient documentation

## 2010-10-06 DIAGNOSIS — I8390 Asymptomatic varicose veins of unspecified lower extremity: Secondary | ICD-10-CM | POA: Insufficient documentation

## 2010-10-06 DIAGNOSIS — E119 Type 2 diabetes mellitus without complications: Secondary | ICD-10-CM | POA: Insufficient documentation

## 2010-10-06 DIAGNOSIS — K449 Diaphragmatic hernia without obstruction or gangrene: Secondary | ICD-10-CM | POA: Insufficient documentation

## 2010-10-06 DIAGNOSIS — E785 Hyperlipidemia, unspecified: Secondary | ICD-10-CM | POA: Insufficient documentation

## 2010-10-06 DIAGNOSIS — M1990 Unspecified osteoarthritis, unspecified site: Secondary | ICD-10-CM | POA: Insufficient documentation

## 2010-10-06 LAB — CBC
HCT: 38.1 % (ref 36.0–46.0)
Hemoglobin: 12.8 g/dL (ref 12.0–15.0)
MCH: 25.5 pg — ABNORMAL LOW (ref 26.0–34.0)
MCHC: 33.6 g/dL (ref 30.0–36.0)
RDW: 15.3 % (ref 11.5–15.5)

## 2010-10-06 LAB — DIFFERENTIAL
Basophils Relative: 0 % (ref 0–1)
Eosinophils Relative: 1 % (ref 0–5)
Monocytes Absolute: 0.7 10*3/uL (ref 0.1–1.0)
Monocytes Relative: 6 % (ref 3–12)
Neutro Abs: 6.3 10*3/uL (ref 1.7–7.7)

## 2010-10-06 LAB — BASIC METABOLIC PANEL
BUN: 16 mg/dL (ref 6–23)
Calcium: 9.4 mg/dL (ref 8.4–10.5)
GFR calc non Af Amer: >60 mL/min (ref >60)
Glucose, Bld: 120 mg/dL — ABNORMAL HIGH (ref 70–99)
Sodium: 136 mEq/L (ref 135–145)

## 2010-10-06 LAB — CARDIAC PANEL(CRET KIN+CKTOT+MB+TROPI)
CK, MB: 2.2 ng/mL (ref 0.3–4.0)
Total CK: 40 U/L (ref 7–177)

## 2010-10-06 LAB — D-DIMER, QUANTITATIVE: D-Dimer, Quant: 0.3 ug/mL-FEU (ref 0.00–0.48)

## 2010-10-06 LAB — GLUCOSE, CAPILLARY: Glucose-Capillary: 185 mg/dL — ABNORMAL HIGH (ref 70–99)

## 2010-10-07 LAB — CARDIAC PANEL(CRET KIN+CKTOT+MB+TROPI)
CK, MB: 1.4 ng/mL (ref 0.3–4.0)
Relative Index: INVALID (ref 0.0–2.5)
Total CK: 39 U/L (ref 7–177)

## 2010-10-07 LAB — GLUCOSE, CAPILLARY: Glucose-Capillary: 133 mg/dL — ABNORMAL HIGH (ref 70–99)

## 2010-10-07 NOTE — H&P (Signed)
NAMEWILMOTH, RASNIC NO.:  [id number]  MEDICAL RECORD NO.:  [id number]  LOCATION:  [facility]                         FACILITY:  [facility]  PHYSICIAN:  Lonia Blood, M.D.       DATE OF BIRTH:  1932-08-16  DATE OF ADMISSION:  10/06/2010 DATE OF DISCHARGE:                             HISTORY & PHYSICAL   PRIMARY CARE PHYSICIAN:  Tally Joe, MD  CHIEF COMPLAINT:  Chest pain.  HISTORY OF PRESENT ILLNESS:  Ms. Adney is 77 year old woman without any significant past medical history related to coronary disease who was in the preoperative area today getting ready for an elective right total knee replacement.  She started developing severe retrosternal 10/10 chest pressure and she had to be wheeled down to the emergency room for further workup and observation.  PAST MEDICAL HISTORY: 1. Hypertension. 2. Hyperlipidemia. 3. Diabetes. 4. Anxiety. 5. History of cerebral aneurysm status post coiling. 6. History of endometriosis status post hysterectomy. 7. History of peptic ulcer disease. 8. Hiatal hernia. 9. Mitral valve prolapse. 10.Varicose veins. 11.Gastroesophageal reflux disease. 12.Irritable bowel  syndrome. 13.Diabetes mellitus type 2.  HOME MEDICATIONS:  Alprazolam, trandolapril, atenolol, fish oil, Protonix, multivitamin, coenzyme-Q, and furosemide.  SOCIAL HISTORY:  Never smoked, worked as a Runner, broadcasting/film/video.  Does not drink any alcohol.  Married with two step daughters.  ALLERGIES: 1. BENTYL. 2. FOSAMAX. 3. GLIMEPIRIDE. 4. JANUVIA.  REVIEW OF SYSTEMS:  As per HPI.  Also positive for the right knee pain, varicose veins in the right leg.  Otherwise per HPI all negative.  PHYSICAL EXAMINATION:  VITAL SIGNS:  Upon admission, temperature 97.9, heart rate is 51, blood pressure 111/57, respiratory rate is 13, and oxygen saturation 100% on 2 L of oxygen. GENERAL:  The patient is alert, oriented, and in no acute stress. HEAD:  Normocephalic and atraumatic. EYES:   Pupils equal, round, and react to light and accommodation. Extraocular movements intact. THROAT:  Clear. NECK:  Supple.  No JVD. CHEST:  Clear to auscultation.  No wheezes, rhonchi, or crackles. HEART:  Regular rate and rhythm without murmurs, rubs, or gallops. ABDOMEN:  Soft, nontender, and nondistended.  Bowel sounds are present. EXTREMITIES:  Without significant edema on the left, right has mild edema and some calf tenderness. MUSCULOSKELETAL: Bony deformities in the joints suggestive for osteoarthritis. NEUROLOGIC:  Cranial nerves intact.  Strength is 5/5 in all 4 extremities.  Sensation is intact.  LABORATORY VALUES:  On admission, white blood cell count is 12,000, hemoglobin 12.8, and platelet count is 254.  CK 56, troponin I is zero. Sodium is 136, potassium 4.8, chloride 99, bicarbonate 30, BUN 16, creatinine 0.6, and glucose 120.  Chest x-ray two-view shows stable cardiomegaly, no active lung disease.  EKG shows normal sinus rhythm with sinus bradycardia.  No ST elevation or ST depression.  No T-wave changes.  ASSESSMENT AND PLAN:  This is a 77 year old presenting with typical angina.  She has a history of normal stress test back in June 2011.  We will go ahead and placed her on observation due to her age, hypertension, diabetes.  She could still have unstable angina.  We will go ahead and treat with aspirin and atenolol,  follow serial cardiac enzymes, and telemetry monitoring.  If she has recurrent chest pain, we will repeat EKG and obtain emergent cardiology consultation with [facility] cardiology.  Since the patient has a right lower extremity edema and some varicose veins, we will do a STAT D-dimer and if it is positive, we will go ahead and do lower extremity venous Dopplers, consider CTHS, although I am totally suspicions of a pulmonary embolism.     Lonia Blood, M.D.     SL/MEDQ  D:  10/06/2010  T:  10/06/2010  Job:  [id number]  cc:   Windy Fast A. Darrelyn Hillock, M.D. Tally Joe, M.D. Armanda Magic, M.D.  Electronically Signed by Lonia Blood M.D. on 10/07/2010 07:37:01 AM

## 2010-10-07 NOTE — Consult Note (Signed)
[name], [name] NO.:  [id number]  MEDICAL RECORD NO.:  [id number]  LOCATION:  3712                         FACILITY:  [facility]  PHYSICIAN:  Jake Bathe, MD      DATE OF BIRTH:  [birth year]  DATE OF CONSULTATION:  10/07/2010 DATE OF DISCHARGE:                                CONSULTATION   CARDIOLOGIST:  Armanda Magic, MD  PRIMARY PHYSICIAN:  Tally Joe, MD  Consultation request is for evaluation of chest pain.  HISTORY OF PRESENT ILLNESS:  A 77 year old female with hypertension, hyperlipidemia, brother who had myocardial infarction in his early 13s with peptic ulcer disease, hiatal hernia, and history of IBS, diabetes, GERD on her past medical records who was about to undergo knee surgery yesterday was on the operating room table laying flat and began to develop sudden onset sharp central chest discomfort that was "the worse she has ever had in her life" with radiation to her back that lasted throughout the day into the evening gradually subsiding with no associated radiation to her neck or shortness of breath, diaphoresis. Last year in June 2011, she underwent a consultation by Dr. Mayford Knife secondary to atypical chest pain.  At that time, she was complaining of pain for about 2-3 months which she attributed to her hiatal hernia but she was having fatigue associated with this and pain that seemed to be exertional as well.  She underwent a nuclear stress test which had breast attenuation, decrease in sensitivity and specificity of study but overall showed a normal EF and no evidence of ischemia.  Low risk study. Based upon the study, she was going to proceed with surgery yesterday.  Because of her chest pain, she was wheeled to the emergency room for further workup and evaluation.  During the hospitalization, no alarms were noted on telemetry, occasional PVCs, heart rate in the upper 50s mostly, cardiac markers were all negative and EKG showed no evidence  of any ST-segment changes.  There were subtle J-point elevation in the inferior leads which was seen on previous EKG.  These findings were personally viewed.  Her chest x-ray also demonstrated no active lung disease and was stable.  This was personally reviewed as well.  Once the evening arrived, her chest pain was gone and she had no further discomfort.  Dr. Lavera Guise with [facility] asked me to see her for further evaluation.  In talking with her and her husband who was also a patient of Dr. Mayford Knife, he thinks that her anxiety level was playing a role in her symptoms.  She is concerned about her brother's heart history and stated that "you can never be too safe about the heart."  PAST MEDICAL HISTORY: 1. Hypertension. 2. Hyperlipidemia. 3. Colon cancer/polyps Dr. Corinda Gubler. 4. Endometriosis status post hysterectomy. 5. Peptic ulcer disease. 6. Hiatal hernia. 7. GERD. 8. Osteopenia. 9. IBS. 10.Diabetes. 11.Allergic rhinitis. 12.Prior reaction to flu shot.  SURGICAL HISTORY:  Tonsillectomy, appendectomy, hysterectomy, brain aneurysm, coiling, cataract surgery, colon cancer surgery x2, and right hand surgery.  Cardiac catheterization in July 2002, which showed no evidence of any coronary artery disease with mildly dilated aortic root. This catheterization was performed by Gaspar Garbe  Little.  MEDICATIONS:  At home, she is taking: 1. Atropine tablet combination as needed. 2. Multivitamin. 3. Coenzyme Q10. 4. Alprazolam. 5. Furosemide 20 mg once a day. 6. Pantoprazole 40 mg once a day. 7. Fish oil 1000 mg twice a day. 8. Atenolol 50 mg once a day. 9. Trandolapril 4 mg once a day.  ALLERGIES:  She is allergic to BENTYL, FOSAMAX causes upper GI symptoms, GLIMEPIRIDE causes low blood sugars, JANUVIA causes gastritis, LIPITOR causes myalgias, METFORMIN causes gastritis, ZOLOFT causes jitteriness, PENICILLIN and ASPIRIN causes gastritis and ulcer.  SOCIAL HISTORY:  She has never  smoked.  No alcohol use.  She drinks occasional caffeine.  Currently retired, married with no children.  FAMILY HISTORY:  Brother died of heart attack in his early 32s.  Father died of myocardial infarction, had renal cancer.  Mother died of heart failure, stroke also runs in her family.  REVIEW OF SYSTEMS:  She denies any fevers, chills, recent nausea, vomiting, joint discomfort, orthopnea, PND, syncope, palpitations. Unless specified above, all other 12 review of systems negative.  PHYSICAL EXAMINATION:  VITAL SIGNS:  Blood pressure 146/68, temperature 97.7, pulse 59, respirations 18, satting 100% on room air. GENERAL:  She is alert and oriented x3, mildly anxious in no acute distress. EYES:  Well-perfused conjunctivae.  EOMI.  No scleral icterus. NECK:  Supple.  No lymphadenopathy.  No thyromegaly.  No carotid bruits. No JVD. CARDIOVASCULAR:  Regular rate and rhythm with a soft systolic murmur heard at left upper sternal border.  Normal PMI. LUNGS:  Clear to auscultation bilaterally.  Normal respiratory effort. No wheezes.  No rales. ABDOMEN:  Soft, nontender, normoactive bowel sounds.  Mild mid epigastric tenderness. EXTREMITIES:  No clubbing, cyanosis or edema.  Normal distal pulses. GU:  Deferred. RECTAL:  Deferred. NEURO:  Nonfocal.  Cranial nerves II-XII grossly intact. SKIN:  Warm, dry and intact.  LABS AND DIAGNOSTICS:  Chest x-ray, EKGs, cardiac markers all reviewed as above and unremarkable.  D-dimer was normal.  Creatinine 0.6, potassium 4.8.  BNP was 176 in the normal range.  Prior nuclear stress test reviewed as well.  ASSESSMENT/PLAN:  A 77 year old female with a family history of coronary artery disease, hypertension, hyperlipidemia, diabetes with chest discomfort prior to knee surgery. 1. Chest pain - reassuring cardiac markers as well as EKG and story is     somewhat atypical, however, given her last stress test over 1 year     ago, I will set her up as an  outpatient for a repeat nuclear stress     test evaluation.  If symptoms worsen or become more worrisome, Dr.     Mayford Knife may wish to proceed with cardiac catheterization although in     2002, she did have a cardiac catheterization performed and this     showed no evidence of any epicardial coronary artery disease.  I do     believe that anxiety as well as possible gastroesophageal reflux     disease, hiatal hernia are playing a role in her symptoms     especially that these occurred in the recumbent position and these     were just prior to anesthesia administration.  Her husband also     thinks that anxiety is playing a role but I did state to him that     with her cardiac risk factors a further evaluation is warranted. 2. Gastroesophageal reflux disease/hiatal hernia - continue     pantoprazole as an outpatient. 3. Hyperlipidemia -  continue fish oil.  She has had issues with     statins in the past. 4. Anxiety - continue with current antianxiety medications.  I am     comfortable with her being allowed to be discharged and she will     have close followup with outpatient stress testing.  I will relay     findings to Dr. Mayford Knife.  She should have the stress test done     before proceeding with knee surgery.  She agrees.     Jake Bathe, MD     MCS/MEDQ  D:  10/07/2010  T:  10/07/2010  Job:  [id number]  cc:   Armanda Magic, M.D. Tally Joe, M.D. Lonia Blood, M.D.  Electronically Signed by Donato Schultz MD on 10/07/2010 09:04:16 PM

## 2010-10-10 NOTE — Discharge Summary (Signed)
  [name], [name] NO.:  [id number]  MEDICAL RECORD NO.:  [id number]  LOCATION:  3712                         FACILITY:  [facility]  PHYSICIAN:  Lonia Blood, M.D.       DATE OF BIRTH:  [birth year]  DATE OF ADMISSION:  10/06/2010 DATE OF DISCHARGE:  10/07/2010                              DISCHARGE SUMMARY   PRIMARY CARE PHYSICIAN:  Tally Joe, MD  DISCHARGE DIAGNOSES: 1. Chest pain most likely anxiety attack. 2. Osteoarthritis. 3. Hypotension. 4. Hyperlipidemia. 5. Diet controlled diabetes mellitus type 2. 6. History of pseudoaneurysm status post coiling. 7. History of endometriosis, status post hysterectomy. 8. History of peptic ulcer disease -- the patient in intolerant to     aspirin. 9. Hiatal hernia. 10.Mitral valve prolapse. 11.Varicose veins. 12.Gastroesophageal reflux disease with hiatal hernia. 13.Irritable bowel syndrome.  DISCHARGE MEDICATIONS: 1. Tylenol 650 mg by mouth every 4 hours needed for pain. 2. Alprazolam 0.5 mg 3 times a day as needed for anxiety. 3. Atenolol 50 mg daily. 4. Coenzyme Q over-the-counter daily. 5. Lasix 20 mg daily. 6. Multivitamin over-the-counter daily. 7. Protonix 20 mg daily. 8. Tramadol 50 mg 3 times a day as needed for pain. 9. Trandolapril 4 mg by mouth daily.  CONDITION ON DISCHARGE:  Discharged to skilled nursing in good condition.  Chest painfree.  She will follow up in the outpatient setting with [facility], Dr. Armanda Magic.  PROCEDURE THIS ADMISSION:  No procedure done.  CONSULTATION:  The patient is in consultation by Dr. Donato Schultz from Cardiology.  HISTORY AND PHYSICAL:  Refer to dictated H and P done by Dr. Lavera Guise.  HOSPITAL COURSE:  Ms. Regula is a 77 year old woman with history of hypertension, hyperlipidemia and diabetes who was brought to the emergency room with crushing substernal chest pain that she was experienced prior to an orthopedic procedure.  The patient was observed on  telemetry floor 24 hours, 3 sets of cardiac enzymes were checked. She was chest painfree and there was no indication for myocardial injury or objective evidence of myocardial ischemia.  The patient had a stress test last year which was deemed as a low risk and after she got cardiology clearance from Dr. Anne Fu, we went ahead and proceed with discharge with close outpatient followup.  We think that the patient's episode of chest pain was related to anxiety, panic attack, hence we have increased her dose of alprazolam.     Lonia Blood, M.D.     SL/MEDQ  D:  10/08/2010  T:  10/08/2010  Job:  [id number]  cc:   Tally Joe, M.D. Armanda Magic, M.D. Ronald A. Darrelyn Hillock, M.D.  Electronically Signed by Lonia Blood M.D. on 10/10/2010 03:26:01 PM

## 2010-10-29 LAB — CBC
HCT: 34.6 — ABNORMAL LOW
Hemoglobin: 11.1 — ABNORMAL LOW
MCHC: 32
Platelets: 187
RDW: 16.9 — ABNORMAL HIGH

## 2010-10-29 LAB — COMPREHENSIVE METABOLIC PANEL
Albumin: 3.5
Alkaline Phosphatase: 79
BUN: 11
Chloride: 106
Creatinine, Ser: 0.72
GFR calc non Af Amer: >60
Glucose, Bld: 126 — ABNORMAL HIGH
Potassium: 4.5
Total Bilirubin: 0.7

## 2010-10-29 LAB — CK TOTAL AND CKMB (NOT AT ARMC)
CK, MB: 1.1
Total CK: 53

## 2010-10-29 LAB — DIFFERENTIAL
Basophils Absolute: 0.1
Basophils Relative: 1
Lymphocytes Relative: 49 — ABNORMAL HIGH
Neutro Abs: 2.9
Neutrophils Relative %: 41 — ABNORMAL LOW

## 2010-10-29 LAB — LIPID PANEL
Cholesterol: 151
HDL: 24 — ABNORMAL LOW
Total CHOL/HDL Ratio: 6.3
VLDL: 16

## 2010-10-29 LAB — PROTIME-INR
INR: 1
Prothrombin Time: 13.3

## 2010-10-29 LAB — CARDIAC PANEL(CRET KIN+CKTOT+MB+TROPI)
Relative Index: INVALID
Total CK: 58
Troponin I: 0.01

## 2010-10-29 LAB — B-NATRIURETIC PEPTIDE (CONVERTED LAB): Pro B Natriuretic peptide (BNP): 84

## 2010-10-29 LAB — TROPONIN I: Troponin I: <0.01

## 2010-11-09 LAB — CREATININE, SERUM: GFR calc Af Amer: >60

## 2011-05-31 ENCOUNTER — Ambulatory Visit (INDEPENDENT_AMBULATORY_CARE_PROVIDER_SITE_OTHER): Payer: Medicare Other | Admitting: Internal Medicine

## 2011-05-31 ENCOUNTER — Other Ambulatory Visit (INDEPENDENT_AMBULATORY_CARE_PROVIDER_SITE_OTHER): Payer: Medicare Other

## 2011-05-31 ENCOUNTER — Encounter: Payer: Self-pay | Admitting: Internal Medicine

## 2011-05-31 VITALS — BP 132/77 | HR 76 | Ht 63.0 in | Wt 181.0 lb

## 2011-05-31 DIAGNOSIS — R1031 Right lower quadrant pain: Secondary | ICD-10-CM

## 2011-05-31 DIAGNOSIS — D509 Iron deficiency anemia, unspecified: Secondary | ICD-10-CM

## 2011-05-31 DIAGNOSIS — R109 Unspecified abdominal pain: Secondary | ICD-10-CM

## 2011-05-31 DIAGNOSIS — R1032 Left lower quadrant pain: Secondary | ICD-10-CM

## 2011-05-31 DIAGNOSIS — K589 Irritable bowel syndrome without diarrhea: Secondary | ICD-10-CM

## 2011-05-31 DIAGNOSIS — K625 Hemorrhage of anus and rectum: Secondary | ICD-10-CM

## 2011-05-31 LAB — CBC WITH DIFFERENTIAL/PLATELET
Basophils Relative: 0.3 % (ref 0.0–3.0)
Eosinophils Relative: 1.5 % (ref 0.0–5.0)
Hemoglobin: 13.2 g/dL (ref 12.0–15.0)
MCV: 77.2 fl — ABNORMAL LOW (ref 78.0–100.0)
Monocytes Absolute: 0.6 10*3/uL (ref 0.1–1.0)
Neutro Abs: 4.2 10*3/uL (ref 1.4–7.7)
Neutrophils Relative %: 46.4 % (ref 43.0–77.0)
RBC: 5.34 Mil/uL — ABNORMAL HIGH (ref 3.87–5.11)
WBC: 9.1 10*3/uL (ref 4.5–10.5)

## 2011-05-31 LAB — IGA: IgA: 342 mg/dL (ref 68–378)

## 2011-05-31 NOTE — Progress Notes (Signed)
  Subjective:    Patient ID: [name], female    DOB: 1932-02-08, 78 y.o.   MRN: [id number]  HPI Patient returns for followup. She was seen last year, and was having lower, pain and some alteration in bowel habits. CT scanning showed diverticulosis. She was treated with antibiotics and anti-spasmodic and improved but had persistent lower, pain. She was to have a flexible sigmoidoscopy to evaluate the abdominal pain, question if she had diverticulitis. She was also have an upper GI endoscopy to complete her workup for iron deficiency anemia which was ongoing, having had a colonoscopy previously that did not reveal an obvious cause. She fractured her toes and injured her knee and is just now following up.  She continues to have a sort of constant lower quadrant abdominal pain both sides. There is crampy more severe pain that responds to like a part where she will get constipated if she takes that sometimes. She is using stool softeners at times and generally moving her bowels okay, having bowel movements most days. There is no change in stool caliber. There is some intermittent rectal bleeding though she has known hemorrhoids. The blood is always bright red, either streaked on the stool, sometimes into the commode or on the toilet paper. Lomotil as on her medication list but she's not had diarrhea she's not using that.  Her abdominal pain does improve with defecation. She tends to awaken in the morning with back and lower, pain and when she has a bowel movement this improves. Medications, allergies, past medical history, past surgical history, family history and social history are reviewed and updated in the EMR.   Review of Systems Pain with peripheral neuropathy, lower extremity also with chronic swelling    Objective:   Physical Exam General:  NAD Eyes:   anicteric Lungs:  clear Heart:  S1S2 no rubs, murmurs or gallops Abdomen:  Soft, BS+, mildly tender in lower abdomen without rebound or  guarding Ext:   Bilateral lower edema    Data Reviewed:  Previous office notes labs, endoscopic evaluation and        Assessment and Plan ment & Plan:   1. Bilateral lower abdominal pain   2. Iron deficiency anemia   3. Rectal bleeding   4. IBS (irritable bowel syndrome)     Most of her symptoms are probably from IBS. Celiac disease is possible cause of anemia and the symptoms so we will check for that with a TTG antibodies and IgA level. Ferritin and CBC will be checked to followup in our deficiency anemia. She will undergo an EGD and flexible sigmoidoscopy for anemia evaluation and also to evaluate the pain, I had wondered whether or not she might have low-grade diverticulitis that does not show up on CT scan. I believe the rectal bleeding is from hemorrhoids.  The risks and benefits as well as alternatives of endoscopic procedure(s) have been discussed and reviewed. All questions answered. The patient agrees to proceed.  Further plans pending the results. We will call the lab results to her when they return.  CC: Sissy Hoff, MD

## 2011-05-31 NOTE — Progress Notes (Signed)
Quick Note:  Waiting on TTG Ab and then will call her with results ______

## 2011-05-31 NOTE — Patient Instructions (Addendum)
Your physician has requested that you go to the basement for lab work before leaving today  You have been scheduled for an endoscopy and a flex sig. Please follow written instructions given to you at your visit today.

## 2011-06-01 LAB — TISSUE TRANSGLUTAMINASE, IGA: Tissue Transglutaminase Ab, IgA: 7.2 U/mL (ref <20)

## 2011-06-02 ENCOUNTER — Encounter: Payer: Self-pay | Admitting: Internal Medicine

## 2011-06-02 NOTE — Progress Notes (Signed)
Quick Note:  Please let her know that her hemoglobin was normal but her iron is still low. Testing for celiac or gluten allergy was negative.  She should take ferrous sulfate 325 mg once a day. I will see her at her EGD and sigmoidoscopy and explain more. ______

## 2011-06-28 ENCOUNTER — Ambulatory Visit (AMBULATORY_SURGERY_CENTER): Payer: Medicare Other | Admitting: Internal Medicine

## 2011-06-28 ENCOUNTER — Encounter: Payer: Self-pay | Admitting: Internal Medicine

## 2011-06-28 VITALS — BP 89/65 | HR 59 | Temp 98.5°F | Resp 16 | Ht 63.0 in | Wt 181.0 lb

## 2011-06-28 DIAGNOSIS — D131 Benign neoplasm of stomach: Secondary | ICD-10-CM

## 2011-06-28 DIAGNOSIS — K317 Polyp of stomach and duodenum: Secondary | ICD-10-CM

## 2011-06-28 DIAGNOSIS — K5730 Diverticulosis of large intestine without perforation or abscess without bleeding: Secondary | ICD-10-CM

## 2011-06-28 DIAGNOSIS — K648 Other hemorrhoids: Secondary | ICD-10-CM

## 2011-06-28 DIAGNOSIS — K625 Hemorrhage of anus and rectum: Secondary | ICD-10-CM

## 2011-06-28 DIAGNOSIS — R109 Unspecified abdominal pain: Secondary | ICD-10-CM

## 2011-06-28 DIAGNOSIS — K2980 Duodenitis without bleeding: Secondary | ICD-10-CM

## 2011-06-28 DIAGNOSIS — D509 Iron deficiency anemia, unspecified: Secondary | ICD-10-CM

## 2011-06-28 MED ORDER — SODIUM CHLORIDE 0.9 % IV SOLN: 500.0000 mL | INTRAVENOUS | Status: DC

## 2011-06-28 NOTE — Patient Instructions (Addendum)

## 2011-06-28 NOTE — Progress Notes (Signed)
Patient did not experience any of the following events: a burn prior to discharge; a fall within the facility; wrong site/side/patient/procedure/implant event; or a hospital transfer or hospital admission upon discharge from the facility. (G8907) Patient did not have preoperative order for IV antibiotic SSI prophylaxis. (G8918)  

## 2011-06-28 NOTE — Op Note (Signed)
[facility] [address] [place], [place]  [address]  ENDOSCOPY PROCEDURE REPORT  PATIENT:  [name], [name]  MR#:  [id number] BIRTHDATE:  1932/04/05, 78 yrs. old  GENDER:  female  ENDOSCOPIST:  Iva Boop, MD, [facility]  PROCEDURE DATE:  06/28/2011 PROCEDURE:  EGD with biopsy, 19147 ASA CLASS:  Class II INDICATIONS:  anemia, abdominal pain  MEDICATIONS:   These medications were titrated to patient response per physician's verbal order, Fentanyl 50 mcg IV, Versed 4 mg IV TOPICAL ANESTHETIC:  Cetacaine Spray  DESCRIPTION OF PROCEDURE:   After the risks benefits and alternatives of the procedure were thoroughly explained, informed consent was obtained.  The LB [id number]-H180 [id number] endoscope was introduced through the mouth and advanced to the second portion of the duodenum, without limitations.  The instrument was slowly withdrawn as the mucosa was fully examined. <<PROCEDUREIMAGES>>  There were multiple polyps identified. in the body of the stomach. Some in fundus also. Numerous, all subcentimeter and fleshy, pale. Representative biopsies taken.  Nodular mucosa was found in the bulb of the duodenum. Biopsies taken.  Otherwise the examination was normal.    Retroflexed views revealed Retroflexion exam demonstrated findings as previously described.    The scope was then withdrawn from the patient and the procedure completed.  COMPLICATIONS:  None  ENDOSCOPIC IMPRESSION: 1) Polyps, multiple in the body of the stomach - look like benign fundic gland polyps - biopsied 2) Nodular mucosa in the bulb of duodenum - biopsied 3) Otherwise normal examination RECOMMENDATIONS: 1) Await biopsy results 2) Flex sig next  Iva Boop, MD, [facility]  CC:  The Patient and Tally Joe, MD  n. Rosalie Doctor:   Iva Boop at 06/28/2011 02:37 PM  Leone Payor, [phone]

## 2011-06-28 NOTE — Op Note (Signed)
[facility] [address] [place], [place]  [address]  FLEXIBLE SIGMOIDOSCOPY PROCEDURE REPORT  PATIENT:  Hailey, Black  MR#:  [id number] BIRTHDATE:  [birth year], 78 yrs. old  GENDER:  female  ENDOSCOPIST:  Iva Boop, MD, [facility]  PROCEDURE DATE:  06/28/2011 PROCEDURE:  Flexible Sigmoidoscopy, diagnostic ASA CLASS:  Class II INDICATIONS:  abdominal pain, rectal bleeding  MEDICATIONS:   There was residual sedation effect present from prior procedure., These medications were titrated to patient response per physician's verbal order, Versed 2 mg IV  DESCRIPTION OF PROCEDURE:   After the risks benefits and alternatives of the procedure were thoroughly explained, informed consent was obtained.  Digital rectal exam was performed and revealed no abnormalities.   The [id number] endoscope was introduced through the anus and advanced to the descending colon, without limitations.  The quality of the prep was adequate. The instrument was then slowly withdrawn as the mucosa was fully examined. <<PROCEDUREIMAGES>>  Severe diverticulosis was found in the sigmoid colon.  The examination was otherwise normal.   Retroflexed views in the rectum revealed medium internal hemorrhoids.    The scope was then withdrawn from the patient and the procedure terminated.  COMPLICATIONS:  None  ENDOSCOPIC IMPRESSION: 1) Severe diverticulosis in the sigmoid colon 2) Medium internal hemorrhoids 3) Otherwise normal examination. RECOMMENDATIONS: 1) Continue current therapy 2) Call Dr. Marvell Fuller office soon and make an appointment to see him in July  Iva Boop, MD, [facility]  CC:  Tally Joe, MD and The Patient  n. eSIGNED:   Iva Boop at 06/28/2011 02:45 PM  Leone Payor, [phone]

## 2011-06-29 ENCOUNTER — Telehealth: Payer: Self-pay | Admitting: *Deleted

## 2011-06-29 NOTE — Telephone Encounter (Signed)
  Follow up Call-  Call back number 06/28/2011  Post procedure Call Back phone  # [phone] or [phone]  Permission to leave phone message Yes     Patient questions:  Do you have a fever, pain , or abdominal swelling? no Pain Score  0 *  Have you tolerated food without any problems? yes  Have you been able to return to your normal activities? yes  Do you have any questions about your discharge instructions: Diet   no Medications  no Follow up visit  no  Do you have questions or concerns about your Care? no  Actions: * If pain score is 4 or above: No action needed, pain <4.

## 2011-07-05 ENCOUNTER — Encounter: Payer: Self-pay | Admitting: Internal Medicine

## 2011-07-05 NOTE — Progress Notes (Signed)
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

## 2011-07-08 ENCOUNTER — Telehealth: Payer: Self-pay | Admitting: Internal Medicine

## 2011-07-08 NOTE — Telephone Encounter (Signed)
I have reviewed the results with the patient.  She is asked to keep her appt for 08/10/11

## 2011-07-19 ENCOUNTER — Other Ambulatory Visit: Payer: Self-pay | Admitting: Internal Medicine

## 2011-08-10 ENCOUNTER — Ambulatory Visit (INDEPENDENT_AMBULATORY_CARE_PROVIDER_SITE_OTHER): Payer: Medicare Other | Admitting: Internal Medicine

## 2011-08-10 ENCOUNTER — Encounter: Payer: Self-pay | Admitting: Internal Medicine

## 2011-08-10 VITALS — BP 100/60 | HR 72 | Ht 63.0 in | Wt 179.0 lb

## 2011-08-10 DIAGNOSIS — K589 Irritable bowel syndrome without diarrhea: Secondary | ICD-10-CM

## 2011-08-10 DIAGNOSIS — D509 Iron deficiency anemia, unspecified: Secondary | ICD-10-CM

## 2011-08-10 NOTE — Progress Notes (Signed)
Patient ID: [name], female   DOB: 07-05-1932, 78 y.o.   MRN: [id number]  Chief Complaint:  Abdominal  pain and diarrhea  History of present illness: The patient returns for followup of abdominal pain and IBS problems. She reports that she said 2 or 3 spells of diarrhea since EGD and flexible sigmoidoscopy were performed within the last couple of months. She has glycopyrrolate helps her, pain. She clearly associates stress and worried with spells. She is still concerned about her husband and his dementia issues. Overall she feels improved however with less frequent pain problems in general he is tolerating things. She does not have fecal incontinence. She cannot identify food triggers. She does use Xanax to sleep but has not tried it for anxiety and stress during the day. She does report 2 episodes of bright red blood on the tissue paper since procedures, one was right after the flexible sigmoidoscopy, and we know she has hemorrhoids.  She also reports that her fingers are changing color, especially one the third digit on the left hand appears purple. There is some mild pain associated with that. This is concerning her and she does not recall any type of injury.  Allergies  Allergen Reactions  . Aspirin   . Penicillins    Outpatient Prescriptions Prior to Visit  Medication Sig Dispense Refill  . ALPRAZolam (XANAX) 0.5 MG tablet Take 0.25 mg by mouth at bedtime as needed.        Marland Kitchen atenolol (TENORMIN) 50 MG tablet Take 50 mg by mouth daily.        . Coenzyme Q10 100 MG capsule Take 100 mg by mouth daily.        . diphenoxylate-atropine (LOMOTIL) 2.5-0.025 MG per tablet Take 1-2 tablets by mouth before meals and at bedtime as needed for diarrhea       . furosemide (LASIX) 20 MG tablet Take 20 mg by mouth daily as needed.        Marland Kitchen glycopyrrolate (ROBINUL-FORTE) 2 MG tablet Take 1 tablet 1-2 times daily as needed for cramping and spasms       . hydrocortisone (ANUSOL-HC) 2.5 % rectal cream  Apply a small amount to anal area as needed for hemorriods       . hydrocortisone 1 % cream Apply to rectum as needed       . linagliptin (TRADJENTA) 5 MG TABS tablet Take 5 mg by mouth daily.      . Multiple Vitamin (MULTIVITAMIN) tablet Take 1 tablet by mouth daily.        . Omega-3 Fatty Acids (FISH OIL) 1000 MG CAPS Take 1 capsule by mouth daily.        . pantoprazole (PROTONIX) 40 MG tablet TAKE 1 TABLET EVERY DAY  30 tablet  11  . trandolapril (MAVIK) 4 MG tablet Take 4 mg by mouth daily.         Past Medical History  Diagnosis Date  . Anxiety   . Migraine   . DM (diabetes mellitus)   . GERD (gastroesophageal reflux disease)   . HLD (hyperlipidemia)   . HTN (hypertension)   . History of cerebral aneurysm repair     s/p coiling  . Peripheral neuropathy   . IBS (irritable bowel syndrome)   . Diverticulosis   . Adenomatous colon polyp 1970     carcinoma in situ  . History of hemorrhoids     with bleeding  . Hiatal hernia 02/03/05  . History of shingles   .  UTI (lower urinary tract infection)   . DDD (degenerative disc disease)   . Iron deficiency   . Acute torn meniscus of knee   . Toe fracture, right     second toe  . Fundic Gland Polyposis Of Stomach   . Duodenitis     peptic, with gastric heterotopia   Past Surgical History  Procedure Date  . Right hand decompressive fasciotomy 08/26/07    , dorsal and volar  . Carpal tunnel release 08/26/07  . Appendectomy   . Colon resection   . Abdominal hysterectomy   . Coronary angioplasty with stent placement   . Cerebral aneurysm repair   . Esophagogastroduodenoscopy 02/03/05    hiatal hernia, 6 benign gastric polyps  . Colonoscopy w/ biopsies and polypectomy 02/03/05    diverticulosis, 4 mm sessile polyps, internal and external hemorrhoids  . Colonoscopy 06/07/08    divertiulosis, internal hemorrhoids  . Tonsillectomy   . Flexible sigmoidoscopy    History   Social History  . Marital Status: Married                  Occupational History  . retired    Social History Main Topics  . Smoking status: Never Smoker   . Smokeless tobacco: Never Used  . Alcohol Use: No  . Drug Use: No  .       Family History  Problem Relation Age of Onset  . Ovarian cancer Mother   . Diabetes Brother   . Heart disease Father   . Kidney disease Father   . Heart disease Brother   . Heart disease Brother   . Diabetes    . Diabetes    . Colon cancer       Review of systems: As per history of present illness  Physical exam:  Well-developed elderly woman in no acute distress Inspection of the hand shows that the third digit on the left hand looks a little bit discolored, and is warm and is nontender though she has some pain in full flexion. The nailbed is pink and overall appears to be intact from a neurovascular standpoint.  Assessment and plan:   1. IBS (irritable bowel syndrome)   This is improved overall. She will review a low fiber diet to see if there might be food triggers but in general she does not seem to get constipated and back up, she just has cramps and diarrhea in stressful situations. I've asked her to consider trying a half a Xanax if she feel stressed her anxious to CL that helps and to use her glycopyrrolate which he tolerates without side effects and does get benefit from. She rarely uses Lomotil only in extreme circumstances.   2. Iron deficiency anemia   Anemia has resolved, ferritin was still low she will remain on iron and followup with Dr. Azucena Cecil to have her ferritin rechecked.    She will see me as needed. I don't think there is anything seriously wrong with her finger, wonder if she didn't traumatize it, she is advised to followup with Dr. Azucena Cecil if this does not improve over the next few days or it worsens. I appreciate the opportunity to care for this patient.   CC: Sissy Hoff, MD

## 2011-08-10 NOTE — Patient Instructions (Addendum)
Try using a half Xanax (alprazolam) when stressed and that may reduce diarrhea and abdominal cramps. Remember to use the glycopyrrolate also (it helps both diarrhea and cramps) - if not so anxious or stressed. Read the low fiber diet - you may find some foods that are triggers but you do not need this diet every day. Ask Dr. Azucena Cecil to recheck iron level when you see him and I will also send him a note If your fingers do not improve soon by next week or worsen see Dr. Azucena Cecil. Call back if needed.  Thank you for choosing me and [facility]. Iva Boop, MD, [facility]

## 2011-11-08 ENCOUNTER — Encounter (HOSPITAL_COMMUNITY): Payer: Self-pay | Admitting: Emergency Medicine

## 2011-11-08 ENCOUNTER — Emergency Department (HOSPITAL_COMMUNITY)
Admission: EM | Admit: 2011-11-08 | Discharge: 2011-11-08 | Disposition: A | Discharge status: Home / Self Care | Payer: Medicare Other | Attending: Emergency Medicine | Admitting: Emergency Medicine

## 2011-11-08 ENCOUNTER — Emergency Department (HOSPITAL_COMMUNITY): Payer: Medicare Other

## 2011-11-08 DIAGNOSIS — R51 Headache: Secondary | ICD-10-CM | POA: Insufficient documentation

## 2011-11-08 DIAGNOSIS — R5381 Other malaise: Secondary | ICD-10-CM | POA: Insufficient documentation

## 2011-11-08 DIAGNOSIS — Z79899 Other long term (current) drug therapy: Secondary | ICD-10-CM | POA: Insufficient documentation

## 2011-11-08 DIAGNOSIS — I10 Essential (primary) hypertension: Secondary | ICD-10-CM | POA: Insufficient documentation

## 2011-11-08 DIAGNOSIS — R609 Edema, unspecified: Secondary | ICD-10-CM | POA: Insufficient documentation

## 2011-11-08 DIAGNOSIS — K5730 Diverticulosis of large intestine without perforation or abscess without bleeding: Secondary | ICD-10-CM | POA: Insufficient documentation

## 2011-11-08 DIAGNOSIS — R0609 Other forms of dyspnea: Secondary | ICD-10-CM | POA: Insufficient documentation

## 2011-11-08 DIAGNOSIS — E119 Type 2 diabetes mellitus without complications: Secondary | ICD-10-CM | POA: Insufficient documentation

## 2011-11-08 DIAGNOSIS — R42 Dizziness and giddiness: Secondary | ICD-10-CM | POA: Insufficient documentation

## 2011-11-08 DIAGNOSIS — K7689 Other specified diseases of liver: Secondary | ICD-10-CM | POA: Insufficient documentation

## 2011-11-08 DIAGNOSIS — R109 Unspecified abdominal pain: Secondary | ICD-10-CM

## 2011-11-08 DIAGNOSIS — R0989 Other specified symptoms and signs involving the circulatory and respiratory systems: Secondary | ICD-10-CM | POA: Insufficient documentation

## 2011-11-08 DIAGNOSIS — R0600 Dyspnea, unspecified: Secondary | ICD-10-CM

## 2011-11-08 LAB — BASIC METABOLIC PANEL WITH GFR
CO2: 24 meq/L (ref 19–32)
Calcium: 9.3 mg/dL (ref 8.4–10.5)
Creatinine, Ser: 0.65 mg/dL (ref 0.50–1.10)
GFR calc non Af Amer: 83 mL/min — ABNORMAL LOW (ref >90)

## 2011-11-08 LAB — HEPATIC FUNCTION PANEL
ALT: 18 U/L (ref 0–35)
AST: 17 U/L (ref 0–37)
Albumin: 3.7 g/dL (ref 3.5–5.2)
Alkaline Phosphatase: 79 U/L (ref 39–117)
Bilirubin, Direct: 0.1 mg/dL (ref 0.0–0.3)
Indirect Bilirubin: 0.2 mg/dL — ABNORMAL LOW (ref 0.3–0.9)
Total Bilirubin: 0.3 mg/dL (ref 0.3–1.2)
Total Protein: 6.9 g/dL (ref 6.0–8.3)

## 2011-11-08 LAB — CBC
HCT: 37.3 % (ref 36.0–46.0)
Hemoglobin: 12.1 g/dL (ref 12.0–15.0)
MCH: 24.8 pg — ABNORMAL LOW (ref 26.0–34.0)
MCHC: 32.4 g/dL (ref 30.0–36.0)
MCV: 76.4 fL — ABNORMAL LOW (ref 78.0–100.0)
Platelets: 217 K/uL (ref 150–400)
RBC: 4.88 MIL/uL (ref 3.87–5.11)
RDW: 15.2 % (ref 11.5–15.5)
WBC: 11.6 10*3/uL — ABNORMAL HIGH (ref 4.0–10.5)

## 2011-11-08 LAB — URINALYSIS, ROUTINE W REFLEX MICROSCOPIC
Bilirubin Urine: NEGATIVE
Glucose, UA: NEGATIVE mg/dL
Hgb urine dipstick: NEGATIVE
Ketones, ur: NEGATIVE mg/dL
Nitrite: NEGATIVE
Protein, ur: NEGATIVE mg/dL
Specific Gravity, Urine: 1.008 (ref 1.005–1.030)
Urobilinogen, UA: 0.2 mg/dL (ref 0.0–1.0)
pH: 6.5 (ref 5.0–8.0)

## 2011-11-08 LAB — BASIC METABOLIC PANEL
BUN: 16 mg/dL (ref 6–23)
Chloride: 97 mEq/L (ref 96–112)
GFR calc Af Amer: >90 mL/min (ref >90)
Glucose, Bld: 103 mg/dL — ABNORMAL HIGH (ref 70–99)
Potassium: 4.3 mEq/L (ref 3.5–5.1)
Sodium: 133 mEq/L — ABNORMAL LOW (ref 135–145)

## 2011-11-08 LAB — URINE MICROSCOPIC-ADD ON

## 2011-11-08 LAB — POCT I-STAT TROPONIN I

## 2011-11-08 LAB — LIPASE, BLOOD: Lipase: 18 U/L (ref 11–59)

## 2011-11-08 MED ORDER — IOHEXOL 300 MG/ML  SOLN
100.0000 mL | Freq: Once | INTRAMUSCULAR | Status: AC | PRN
  Administered 2011-11-08: 100 mL via INTRAVENOUS

## 2011-11-08 MED ORDER — SODIUM CHLORIDE 0.9 % IV SOLN
Freq: Once | INTRAVENOUS | Status: AC
  Administered 2011-11-08: 16:00:00 via INTRAVENOUS

## 2011-11-08 MED ORDER — FENTANYL CITRATE 0.05 MG/ML IJ SOLN
50.0000 ug | Freq: Once | INTRAMUSCULAR | Status: AC
  Administered 2011-11-08: 50 ug via INTRAVENOUS
  Filled 2011-11-08: qty 2

## 2011-11-08 NOTE — ED Provider Notes (Signed)
History     CSN: [id number]  Arrival date & time 11/08/11  1347   First MD Initiated Contact with Patient 11/08/11 1513      Chief Complaint  Patient presents with  . Shortness of Breath    (Consider location/radiation/quality/duration/timing/severity/associated sxs/prior treatment) HPI Comments: Pt with multiple complaints and symptoms today, all of which she reports has had in the past and this is not as severe.  She woke up feeling somewhat dizzy, feeling like she was floating.  She told triage RN that she has a h/o panic and felt similar which she didn't mention to me. She has a h/o coiled aneurysm that causes her to feel numb, and she had similar feelings today to priors which are starting to improve.  She also had abd pain across upper abdomen, again she has had before, felt improved after eating milk and cereal this AM, no N/V/D.  She reports abd pain is also easing off, and it is not as severe as prior diverticulitis flares that she has had.  She complains of dry mouth and feeling dehydrated.  She did have a HA earlier, but is resolved now.  She denies tiff neck, fever, rash.  She denies visual change, focal numbness or weakness.  She also thinks perhaps her relatively new DM medication may be causing some of these symptoms and is planning on discussing this with PCP Dr. Azucena Cecil.  She has had intolerance with DM meds in the past.    Patient is a 78 y.o. female presenting with shortness of breath. The history is provided by the patient and the spouse.  Shortness of Breath  Associated symptoms include shortness of breath. Pertinent negatives include no chest pain, no fever, no rhinorrhea and no sore throat.    Past Medical History  Diagnosis Date  . Anxiety   . Migraine   . DM (diabetes mellitus)   . GERD (gastroesophageal reflux disease)   . HLD (hyperlipidemia)   . HTN (hypertension)   . History of cerebral aneurysm repair     s/p coiling  . Peripheral neuropathy   . IBS  (irritable bowel syndrome)   . Diverticulosis   . Adenomatous colon polyp 1970     carcinoma in situ  . History of hemorrhoids     with bleeding  . Hiatal hernia 02/03/05  . History of shingles   . UTI (lower urinary tract infection)   . DDD (degenerative disc disease)   . Iron deficiency   . Acute torn meniscus of knee   . Toe fracture, right     second toe  . Fundic gland polyposis of stomach   . Duodenitis     peptic, with gastric heterotopia    Past Surgical History  Procedure Date  . Right hand decompressive fasciotomy 08/26/07    , dorsal and volar  . Carpal tunnel release 08/26/07  . Appendectomy   . Colon resection   . Abdominal hysterectomy   . Coronary angioplasty with stent placement   . Cerebral aneurysm repair   . Esophagogastroduodenoscopy 02/03/05    hiatal hernia, 6 benign gastric polyps  . Colonoscopy w/ biopsies and polypectomy 02/03/05    diverticulosis, 4 mm sessile polyps, internal and external hemorrhoids  . Colonoscopy 06/07/08    divertiulosis, internal hemorrhoids  . Tonsillectomy   . Flexible sigmoidoscopy     Family History  Problem Relation Age of Onset  . Ovarian cancer Mother   . Diabetes Brother   . Heart disease Father   .  Kidney disease Father   . Heart disease Brother   . Heart disease Brother   . Diabetes    . Diabetes    . Colon cancer      History  Substance Use Topics  . Smoking status: Never Smoker   . Smokeless tobacco: Never Used  . Alcohol Use: No    OB History    Grav Para Term Preterm Abortions TAB SAB Ect Mult Living                  Review of Systems  Constitutional: Negative for fever, chills and appetite change.  HENT: Negative for congestion, sore throat and rhinorrhea.   Respiratory: Positive for shortness of breath.   Cardiovascular: Negative for chest pain.  Gastrointestinal: Positive for abdominal pain. Negative for nausea, vomiting, diarrhea and blood in stool.  Genitourinary: Negative for dysuria,  frequency and flank pain.  Musculoskeletal: Negative for back pain.  Skin: Negative for rash and wound.  Neurological: Positive for dizziness and headaches. Negative for weakness, light-headedness and numbness.  All other systems reviewed and are negative.    Allergies  Aspirin and Penicillins  Home Medications   Current Outpatient Rx  Name Route Sig Dispense Refill  . ALPRAZOLAM 0.5 MG PO TABS Oral Take 0.25 mg by mouth at bedtime as needed.      . ATENOLOL 50 MG PO TABS Oral Take 50 mg by mouth every morning.     Marland Kitchen COENZYME Q10 100 MG PO CAPS Oral Take 100 mg by mouth daily.      Marland Kitchen FERROUS SULFATE 325 (65 FE) MG PO TABS Oral Take 325 mg by mouth daily with breakfast.    . FUROSEMIDE 20 MG PO TABS Oral Take 20 mg by mouth daily as needed.      Marland Kitchen GLYCOPYRROLATE 2 MG PO TABS  Take 1 tablet 1-2 times daily as needed for cramping and spasms     . HYDROCORTISONE 2.5 % RE CREA  Apply a small amount to anal area as needed for hemorriods     . HYDROCORTISONE 1 % EX CREA  Apply to rectum as needed     . LINAGLIPTIN 5 MG PO TABS Oral Take 5 mg by mouth daily.    Marland Kitchen ONE-DAILY MULTI VITAMINS PO TABS Oral Take 1 tablet by mouth daily.      Marland Kitchen FISH OIL 1000 MG PO CAPS Oral Take 1 capsule by mouth daily.      Marland Kitchen PANTOPRAZOLE SODIUM 40 MG PO TBEC  TAKE 1 TABLET EVERY DAY 30 tablet 11  . TRANDOLAPRIL 4 MG PO TABS Oral Take 4 mg by mouth daily.        BP 191/54  Pulse 52  Temp 98.9 F (37.2 C) (Oral)  Resp 20  SpO2 100%  Physical Exam  Nursing note and vitals reviewed. Constitutional: She is oriented to person, place, and time. She appears well-developed and well-nourished.  HENT:  Head: Normocephalic and atraumatic.  Mouth/Throat: Uvula is midline. Mucous membranes are not dry.  Eyes: EOM are normal. Pupils are equal, round, and reactive to light. No scleral icterus.  Neck: Normal range of motion. Neck supple.  Cardiovascular: Normal rate and regular rhythm.   No murmur  heard. Pulmonary/Chest: Effort normal. No respiratory distress. She has no wheezes.  Abdominal: Soft. She exhibits no distension. There is no tenderness.  Musculoskeletal: She exhibits edema.  Neurological: She is alert and oriented to person, place, and time.  Skin: Skin is warm and  dry. She is not diaphoretic.    ED Course  Procedures (including critical care time)  Labs Reviewed  CBC - Abnormal; Notable for the following:    WBC 11.6 (*)     MCV 76.4 (*)     MCH 24.8 (*)     All other components within normal limits  BASIC METABOLIC PANEL - Abnormal; Notable for the following:    Sodium 133 (*)     Glucose, Bld 103 (*)     GFR calc non Af Amer 83 (*)     All other components within normal limits  URINALYSIS, ROUTINE W REFLEX MICROSCOPIC - Abnormal; Notable for the following:    Leukocytes, UA SMALL (*)     All other components within normal limits  HEPATIC FUNCTION PANEL - Abnormal; Notable for the following:    Indirect Bilirubin 0.2 (*)     All other components within normal limits  POCT I-STAT TROPONIN I  LIPASE, BLOOD  URINE MICROSCOPIC-ADD ON   Dg Chest 2 View  11/08/2011  *RADIOLOGY REPORT*  Clinical Data: Sob, upper abdomen pain, some chest pain  CHEST - 2 VIEW  Comparison: 10/06/2010  Findings: Mild cardiomegaly is observed.  No edema.  The patient is rotated to the left on today's exam, resulting in reduced diagnostic sensitivity and specificity.   The lungs appear clear.  There is atherosclerotic calcification of the aortic arch.  IMPRESSION:  1.  Cardiomegaly, without edema. 2.  Atherosclerosis.   Original Report Authenticated By: Dellia Cloud, M.D.    Ct Abdomen Pelvis W Contrast  11/08/2011  *RADIOLOGY REPORT*  Clinical Data: abd pain, weak, dizzy  CT ABDOMEN AND PELVIS WITH CONTRAST  Technique:  Multidetector CT imaging of the abdomen and pelvis was performed following the standard protocol during bolus administration of intravenous contrast.  Contrast:  OMNIPAQUE IOHEXOL 300 MG/ML  SOLN  Comparison: 03/03/2010  Findings: Diffuse hepatic steatosis noted.  Spleen and adrenal glands unremarkable.  Moderate fatty replacement of the pancreas noted.  The gallbladder and biliary system appear unremarkable.  No pathologic retroperitoneal or porta hepatis adenopathy is identified.  Bilateral lower pole scarring in the kidneys noted.  No pathologic pelvic adenopathy is identified.  The appendix is absent.  No dilated bowel noted.  Descending and sigmoid colon diverticulosis noted.  Urinary bladder appears normal.  Uterus is absent.  No adnexal mass.  Small hiatal hernia is observed.  Hemangioma noted in the right L3 vertebral body.  Disc bulges noted at L3-4 and L4-5.  Terminal ileum unremarkable.  IMPRESSION:  1.  Descending and sigmoid colon diverticulosis without active diverticulitis observed. 2.  Diffuse hepatic steatosis. 3.  Mild disc bulges at the L3-4 and L4-5 levels. 4.  Small hiatal hernia.   Original Report Authenticated By: Dellia Cloud, M.D.      1. Abdominal pain   2. Dyspnea     ra sat is 96% and I interpret to be normal.  8:08 PM Pt has ambulated, pain improved, no N/V.  Pt has ultram at home.  Results discussed with pt and family, they are reassured. Return instructions provided and they understand to follow up with PCP within the week for a recheck.    MDM  Pt does not appear toxic, is not hypotensive, febrile, initial labs are reasonable, minimally high WBC, Na is slightly low at 133, but normal BUN and Cr, glucose is ok.  Will get CT of abd due to pain and h/o diverticulitis in the past.  No N/V/D.  Will give IVF's, some analgesics here and continue to monitor.  No HA, no focal deficits, doubt coiled aneurysm is related at this time, and no symptoms or concerns for TIA or CVA at this time.          Gavin Pound. Oletta Lamas, MD 11/08/11 2009

## 2011-11-08 NOTE — ED Notes (Signed)
MD at bedside. 

## 2011-11-08 NOTE — ED Notes (Addendum)
Pt states that today she began having SOB so she went to the urgent care and they sent her here.  Pt also c/o numb hands, face.  Pt states hx of panic attacks and states it kind of feels like this.  HR in the low 50s.  Takes atenolol.  02 sats 100% on RA. Also states that she has been under a lot of stress recently.

## 2011-11-08 NOTE — Discharge Instructions (Signed)
 Abdominal Pain Abdominal pain can be caused by many things. Your caregiver decides the seriousness of your pain by an examination and possibly blood tests and X-rays. Many cases can be observed and treated at home. Most abdominal pain is not caused by a disease and will probably improve without treatment. However, in many cases, more time must pass before a clear cause of the pain can be found. Before that point, it may not be known if you need more testing, or if hospitalization or surgery is needed. HOME CARE INSTRUCTIONS   Do not take laxatives unless directed by your caregiver.  Take pain medicine only as directed by your caregiver.  Only take over-the-counter or prescription medicines for pain, discomfort, or fever as directed by your caregiver.  Try a clear liquid diet (broth, tea, or water) for as long as directed by your caregiver. Slowly move to a bland diet as tolerated. SEEK IMMEDIATE MEDICAL CARE IF:   The pain does not go away.  You have a fever.  You keep throwing up (vomiting).  The pain is felt only in portions of the abdomen. Pain in the right side could possibly be appendicitis. In an adult, pain in the left lower portion of the abdomen could be colitis or diverticulitis.  You pass bloody or black tarry stools. MAKE SURE YOU:   Understand these instructions.  Will watch your condition.  Will get help right away if you are not doing well or get worse. Document Released: 10/27/2004 Document Revised: 04/11/2011 Document Reviewed: 09/05/2007 [facility] Patient Information 2013 [place], [place].    Dyspnea Shortness of breath (dyspnea) is the feeling of uneasy breathing. Dyspnea should be evaluated promptly. DIAGNOSIS  Many tests may be done to find why you are having shortness of breath. Tests may include:  A chest X-ray.   A lung function test.   Blood tests.   Recordings of the electrical activity of the heart (electrocardiogram).   Exercise testing.    Sound wave images of the heart (a cardiac echocardiogram).   A scan.  A cause for your shortness of breath may not be identified initially. In this case, it is important to have a follow-up exam with your caregiver. HOME CARE INSTRUCTIONS   Do not smoke. Smoking is a common cause of shortness of breath. Ask for help to stop smoking.   Avoid being around chemicals that may bother your breathing, such as paint fumes or dust.   Rest as needed. Slowly begin your usual activities.   If medications were prescribed, take them as directed for the full length of time directed. This includes oxygen and any inhaled medications, if prescribed.   It is very important that you follow up with your caregiver or other physician as directed. Waiting to do so or failure to follow up could result in worsening of your condition, possible disability, or death.   Be sure you understand what to do or who to call if your shortness of breath worsens.  SEEK MEDICAL CARE IF:   Your condition does not improve in the time expected.   You have a hard time doing your normal activities even with rest.   You have any side effects from or problems with medications prescribed.  SEEK IMMEDIATE MEDICAL CARE IF:   You feel your shortness of breath is getting worse.   You feel lightheaded, faint or develop a cough not controlled with medications.   You start coughing up blood.   You get pain with breathing.  You get chest pain or pain in your arms, shoulders or belly (abdomen).   You have a fever.   You are unable to walk up stairs or exercise the way you normally can.  MAKE SURE YOU:   Understand these instructions.   Will watch your condition.   Will get help right away if you are not doing well or get worse.  Document Released: 02/25/2004 Document Revised: 09/29/2010 Document Reviewed: 06/04/2009 [facility] Patient Information 2012 [place], [place].

## 2011-11-08 NOTE — ED Notes (Signed)
This nurse called lab, was told that will add on hepatic panel and lipase.

## 2011-11-08 NOTE — ED Notes (Signed)
Patient transported to X-ray 

## 2011-11-08 NOTE — ED Notes (Signed)
This nurse called CT regarding when test was going to be performed, was told tech was in the room with a patient but would check after that, pt informed, will monitor.

## 2011-11-09 LAB — GLUCOSE, CAPILLARY: Glucose-Capillary: 107 mg/dL — ABNORMAL HIGH (ref 70–99)

## 2012-03-13 ENCOUNTER — Other Ambulatory Visit (HOSPITAL_COMMUNITY): Payer: Self-pay | Admitting: Family Medicine

## 2012-03-13 DIAGNOSIS — I729 Aneurysm of unspecified site: Secondary | ICD-10-CM

## 2012-03-19 ENCOUNTER — Ambulatory Visit (HOSPITAL_COMMUNITY)
Admission: RE | Admit: 2012-03-19 | Discharge: 2012-03-19 | Disposition: A | Discharge status: Home / Self Care | Payer: Medicare Other | Source: Ambulatory Visit | Attending: Family Medicine | Admitting: Family Medicine

## 2012-03-19 DIAGNOSIS — I729 Aneurysm of unspecified site: Secondary | ICD-10-CM

## 2012-03-20 ENCOUNTER — Other Ambulatory Visit (HOSPITAL_COMMUNITY): Payer: Self-pay | Admitting: Interventional Radiology

## 2012-03-20 DIAGNOSIS — I729 Aneurysm of unspecified site: Secondary | ICD-10-CM

## 2012-04-24 ENCOUNTER — Telehealth: Payer: Self-pay | Admitting: Internal Medicine

## 2012-04-24 NOTE — Telephone Encounter (Signed)
Spoke with patient and she states it is just her usual problems. Abdominal pain off and on, sometimes intense and sometimes not. Her medications help. She is also having bleeding from her hemorrhoids. Reports blood on tissue when she wipes. Scheduled with Dr. Leone Payor on 05/01/12 at 9:15 AM.

## 2012-05-01 ENCOUNTER — Encounter: Payer: Self-pay | Admitting: Internal Medicine

## 2012-05-01 ENCOUNTER — Ambulatory Visit (INDEPENDENT_AMBULATORY_CARE_PROVIDER_SITE_OTHER): Payer: Medicare Other | Admitting: Internal Medicine

## 2012-05-01 VITALS — BP 128/62 | HR 60 | Ht 62.25 in | Wt 174.1 lb

## 2012-05-01 DIAGNOSIS — R1032 Left lower quadrant pain: Secondary | ICD-10-CM

## 2012-05-01 DIAGNOSIS — K648 Other hemorrhoids: Secondary | ICD-10-CM

## 2012-05-01 DIAGNOSIS — K5730 Diverticulosis of large intestine without perforation or abscess without bleeding: Secondary | ICD-10-CM

## 2012-05-01 MED ORDER — HYDROCORTISONE 2.5 % RE CREA: TOPICAL_CREAM | Freq: Two times a day (BID) | RECTAL | Status: DC | PRN

## 2012-05-01 MED ORDER — MOXIFLOXACIN HCL 400 MG PO TABS: 400.0000 mg | ORAL_TABLET | Freq: Every day | ORAL | Status: AC

## 2012-05-01 NOTE — Patient Instructions (Addendum)
We have sent your medications to your pharmacy for you Anusol refill and the antibiotic. Take as directed. Call us back with update in a week   Thank you for choosing me and [facility].  Iva Boop, M.D., [facility]

## 2012-05-01 NOTE — Progress Notes (Signed)
  Subjective:    Patient ID: [name], female    DOB: [birth year], 79 y.o.   MRN: [id number]  HPI The patient returns complaining of increasing abdominal pain. She says LLQ pain increasing in intensity over past month or so. Also continues to have intermittent bright red blood on the toliet paper. Denies fever. No constipation or diarrhea. She realizes her sxs are chronic but thinks LLQ pain worse. Still caring for demented husband and has had to have EMS out twice due to him getting hypoglycemic after taking insulin without her supervision. Also griving deaths of her best friend and only sibling recently.  Medications, allergies, past medical history, past surgical history, family history and social history are reviewed and updated in the EMR.  Review of Systems + urinary incontinence at times    Objective:   Physical Exam General:  NAD Eyes:   anicteric Lungs:  clear Heart:  S1S2 no rubs, murmurs or gallops Abdomen:  soft and tender LLQ, BS+ Rectal: Anoderm normal, formed brown stool, no mass - female staff present    Data Reviewed:  2013 flex sig and EGD, office notes Lab Results  Component Value Date   HGB 12.1 11/08/2011   02/2012 CT abd/pelvis w/o acute abnormality      Assessment & Plan:  LLQ pain- IBS vs diverticulosis vs diverticulitis  Diverticulosis of colon without hemorrhage  Internal hemorrhoids with complication  1. Moxifloxacin 400 mg daily x 10 days - empiric for possible diverticulitis 2. Call me to f/u in 10 days or so  3. Refill Anusol HC 4. Eventual hemorroidal ligation  JY:[id number] W, MD

## 2012-05-07 ENCOUNTER — Telehealth: Payer: Self-pay | Admitting: Internal Medicine

## 2012-05-07 NOTE — Telephone Encounter (Signed)
ok 

## 2012-05-07 NOTE — Telephone Encounter (Signed)
Patient was not able to tolerate the Moxifloxacin.  She had SOB, rapid heart rate, and increased urinary frequency.  She feels better after stopping the antibiotics.  The LLQ pain has also improved, but is not gone.  She wants to just see how she feels before taking other meds.  She wants to call back if the pain does not improve or worsens.

## 2012-06-15 ENCOUNTER — Telehealth: Payer: Self-pay | Admitting: Internal Medicine

## 2012-06-15 NOTE — Telephone Encounter (Signed)
Patient wants to cancel her appt for hemorrhoid banding next week.  Recently started on insulin and is having trouble regulating.  She feels this would be too much for next week.  She knows to call me back when she is ready to schedule.

## 2012-06-20 ENCOUNTER — Encounter: Payer: Medicare Other | Admitting: Internal Medicine

## 2012-07-28 ENCOUNTER — Other Ambulatory Visit: Payer: Self-pay | Admitting: Internal Medicine

## 2012-07-31 ENCOUNTER — Telehealth: Payer: Self-pay | Admitting: Neurology

## 2012-07-31 NOTE — Telephone Encounter (Signed)
Called  Dr Hazle Quant - [facility] ,  [facility]  Had a sudden onset of blurred vision, CN intact , lenses on place. Left eye very dry. Normal eye exam.  Revisit for TIA : 20-25 left 20/30 right .   [phone]. Home [phone].    Called patient and left VM - explained that due to her aneurysm history ,  asked her to come in next week early Tuesday.   Patient picked up after i left message - she will come in next Tuesday at 8 AM , Dr Hazle Quant referred.  Her PCP is Dr Charise Carwin.

## 2012-08-07 ENCOUNTER — Ambulatory Visit: Payer: Medicare Other | Admitting: Neurology

## 2012-08-07 ENCOUNTER — Encounter: Payer: Self-pay | Admitting: Neurology

## 2012-08-07 ENCOUNTER — Ambulatory Visit (INDEPENDENT_AMBULATORY_CARE_PROVIDER_SITE_OTHER): Payer: Medicare Other | Admitting: Neurology

## 2012-08-07 VITALS — BP 158/69 | HR 57 | Resp 16 | Ht 63.5 in | Wt 170.0 lb

## 2012-08-07 DIAGNOSIS — H3400 Transient retinal artery occlusion, unspecified eye: Secondary | ICD-10-CM

## 2012-08-07 DIAGNOSIS — G453 Amaurosis fugax: Secondary | ICD-10-CM

## 2012-08-07 DIAGNOSIS — G609 Hereditary and idiopathic neuropathy, unspecified: Secondary | ICD-10-CM | POA: Insufficient documentation

## 2012-08-07 DIAGNOSIS — G250 Essential tremor: Secondary | ICD-10-CM | POA: Insufficient documentation

## 2012-08-07 DIAGNOSIS — I671 Cerebral aneurysm, nonruptured: Secondary | ICD-10-CM

## 2012-08-07 DIAGNOSIS — G4700 Insomnia, unspecified: Secondary | ICD-10-CM

## 2012-08-07 HISTORY — DX: Amaurosis fugax: G45.3

## 2012-08-07 MED ORDER — GABAPENTIN 300 MG PO CAPS: 300.0000 mg | ORAL_CAPSULE | ORAL | Status: DC

## 2012-08-07 MED ORDER — ALPRAZOLAM 0.5 MG PO TABS: 0.5000 mg | ORAL_TABLET | Freq: Every day | ORAL | Status: DC

## 2012-08-07 NOTE — Patient Instructions (Signed)
Cerebral Aneurysm A cerebral aneurysm is the bulging or ballooning out of part of the wall of a vein or artery in the brain. CAUSES Common causes include:   Congenital (present since birth) defects.  High blood pressure.  The build-up of fatty deposits in the arteries (atherosclerosis).  Blood vessels that develop abnormally.  Diseases that cause weakening and damage to the walls of blood vessels. Uncommon causes include:  Head trauma (damage caused by an accident).  Infection.  Tumors.  Drug abuse (mostly from cocaine, heroin, and amphetamine use). Cerebral aneurysms can occur at any age. They are more common in adults than in children. They and are slightly more common in women than in men.  SYMPTOMS  The signs and symptoms of an unruptured cerebral aneurysm will partly depend on its size and rate of growth.  A small, unchanging aneurysm will generally produce no symptoms. A larger aneurysm that is steadily growing may produce symptoms such as headache, neck stiffness or pain, loss of feeling in the face or problems with the eyes.  If an aneurysm bursts, the problem can be life-threatening. Symptoms may include:  A sudden and usually severe headache.  Neck stiffness or pain.  Confusion and/or drowsiness.  Problems speaking.  Weakness in an arm and/or a leg.  Nausea (feeling sick to your stomach).  Vision impairment.  Vomiting.  Loss of consciousness. Rupture of a cerebral aneurysm results in bleeding in the brain, causing a stroke. Or, blood can leak into the area around the brain and develop into a blood clot within the skull. More problems can occur as a result of the aneurysm breaking. These include:  Re-bleeding.  Hydrocephalus (an increase in normal brain fluid in the chambers inside the brain).  Vasospasm (blood vessels decrease in size and starve the brain of nutrients and oxygen). TREATMENT  Emergency treatment for a ruptured cerebral aneurysm generally  includes restoring breathing, and reducing pressure inside the head. Immediate emergency surgery may be recommended to help prevent damage caused by hydrocephalus and to reduce the risk of re-bleeding.  When aneurysms are discovered before rupture occurs, microcoil thrombosis or balloon embolization may be performed on patients for whom surgery is considered too risky. During these procedures, a thin, hollow tube (catheter) is inserted through an artery to travel up to the brain. Once the catheter reaches the aneurysm, tiny balloons or coils are used to block blood flow through the aneurysm. Other treatments may include:  Bed rest.  Drug therapy.  Hypertensive-hypervolemic therapy (which elevates blood pressure, increases blood volume, and thins the blood) to drive blood flow through and around blocked arteries and control vasospasm. PROGNOSIS  The prognosis for a patient with a ruptured cerebral aneurysm depends on:  The extent and location of the aneurysm.  The person's age.  General health.  Neurological condition. Some people with a ruptured cerebral aneurysm die from the initial bleeding. Others recover with little or no problems. Early diagnosis and treatment are important. Document Released: 10/09/2001 Document Revised: 04/11/2011 Document Reviewed: 12/19/2006 [facility] Patient Information 2014 [place], [place]. Fall Prevention and Home Safety Falls cause injuries and can affect all age groups. It is possible to use preventive measures to significantly decrease the likelihood of falls. There are many simple measures which can make your home safer and prevent falls. OUTDOORS  Repair cracks and edges of walkways and driveways.  Remove high doorway thresholds.  Trim shrubbery on the main path into your home.  Have good outside lighting.  Clear walkways of  tools, rocks, debris, and clutter.  Check that handrails are not broken and are securely fastened. Both sides of steps should  have handrails.  Have leaves, snow, and ice cleared regularly.  Use sand or salt on walkways during winter months.  In the garage, clean up grease or oil spills. BATHROOM  Install night lights.  Install grab bars by the toilet and in the tub and shower.  Use non-skid mats or decals in the tub or shower.  Place a plastic non-slip stool in the shower to sit on, if needed.  Keep floors dry and clean up all water on the floor immediately.  Remove soap buildup in the tub or shower on a regular basis.  Secure bath mats with non-slip, double-sided rug tape.  Remove throw rugs and tripping hazards from the floors. BEDROOMS  Install night lights.  Make sure a bedside light is easy to reach.  Do not use oversized bedding.  Keep a telephone by your bedside.  Have a firm chair with side arms to use for getting dressed.  Remove throw rugs and tripping hazards from the floor. KITCHEN  Keep handles on pots and pans turned toward the center of the stove. Use back burners when possible.  Clean up spills quickly and allow time for drying.  Avoid walking on wet floors.  Avoid hot utensils and knives.  Position shelves so they are not too high or low.  Place commonly used objects within easy reach.  If necessary, use a sturdy step stool with a grab bar when reaching.  Keep electrical cables out of the way.  Do not use floor polish or wax that makes floors slippery. If you must use wax, use non-skid floor wax.  Remove throw rugs and tripping hazards from the floor. STAIRWAYS  Never leave objects on stairs.  Place handrails on both sides of stairways and use them. Fix any loose handrails. Make sure handrails on both sides of the stairways are as long as the stairs.  Check carpeting to make sure it is firmly attached along stairs. Make repairs to worn or loose carpet promptly.  Avoid placing throw rugs at the top or bottom of stairways, or properly secure the rug with  carpet tape to prevent slippage. Get rid of throw rugs, if possible.  Have an electrician put in a light switch at the top and bottom of the stairs. OTHER FALL PREVENTION TIPS  Wear low-heel or rubber-soled shoes that are supportive and fit well. Wear closed toe shoes.  When using a stepladder, make sure it is fully opened and both spreaders are firmly locked. Do not climb a closed stepladder.  Add color or contrast paint or tape to grab bars and handrails in your home. Place contrasting color strips on first and last steps.  Learn and use mobility aids as needed. Install an electrical emergency response system.  Turn on lights to avoid dark areas. Replace light bulbs that burn out immediately. Get light switches that glow.  Arrange furniture to create clear pathways. Keep furniture in the same place.  Firmly attach carpet with non-skid or double-sided tape.  Eliminate uneven floor surfaces.  Select a carpet pattern that does not visually hide the edge of steps.  Be aware of all pets. OTHER HOME SAFETY TIPS  Set the water temperature for 120 F (48.8 C).  Keep emergency numbers on or near the telephone.  Keep smoke detectors on every level of the home and near sleeping areas. Document Released: 01/07/2002 Document  Revised: 07/19/2011 Document Reviewed: 04/08/2011 [facility] Patient Information 2014 [place], [place]. Insomnia Insomnia means you have trouble falling or staying asleep. It affects about one person in three at different times and is usually related to stress from work, school, or personal relations. Insomnia is also a sign of depression or anxiety. Other medical problems that cause insomnia include conditions that cause pain, night leg cramps, coughing, shortness of breath, urinary problems, and fevers. Sleep apnea is an abnormal breathing pattern at night that can cause insomnia and loud snoring. Certain medications and excess intake of caffeine drinks (coffee, tea,  colas) can also interfere with normal sleep. Treatment for insomnia depends on the cause. Besides specific medical treatment, the following measures can help you relax and get better sleep. Get regular exercise every day, at least several hours before bed time. Try to get to bed at the same time every night. Take a hot bath before retiring to help you relax. Do not stay in bed if you are unable to sleep. During the daytime avoid staying in bed to watch television, eat, or read. Reduce unwanted noise and light in your room. Keep your room at a comfortable temperature. Avoid alcohol as it causes one to sleep less soundly, may cause you to awaken during the night, and can leave you feeling groggy the next day. Using a mild sedative prescribed or suggested by your caregiver may be needed, but the daily use of sleeping pills is not recommended. Anti-depressant medicines can improve sleep in people with depression. Please call your doctor for follow up care to better understand the cause and proper treatment of your insomnia. Document Released: 02/25/2004 Document Revised: 04/11/2011 Document Reviewed: 01/17/2005 [facility] Patient Information 2014 [place], [place].

## 2012-08-07 NOTE — Progress Notes (Signed)
[facility]  Provider:  Dr Evella Kasal Referring Provider: Sissy Hoff, MD Primary Care Physician:  Sissy Hoff, MD    HPI:  [name] is a 79 y.o. female here as a referral from Dr. Hazle Quant for a sudden loss of vision, blurring . She was seen by her ophthalmologist , who referred her here, knowing about her history of cerebral aneurysm.  The patient a had seen me in 2008 last - for aneurysm follow up. The patient reported that she walk up being unable to see anything, managed to go to the bathroom and to try to wash her eyes as she felt there was some film covering the eyes  . She states her blindness/ darkness  turned into blurring , she could see something at about 30 minutes- but  shut her left  eye - perhaps this was diplopia? But the blurred vision lasted for another 2-3 days. During that time, she saw Dr. Randon Goldsmith office associate  and was found to have no significant abnormalities.   Also told me that she loss 3 people she was closed to in the last 6 months, including her 54 year old nephew, her last remaining brother, and her best friend of 60 years.   She has meanwhile become diabetic, neuropathic and retinopathic . Her renal and hepatic labs  were normal. Her ferritin was extremely low and she had no thyroid evaluation yet. She is concerned about the possible stroke- amaurosis and mentions a burning dysesthesia she would like to be worked up for as well.    I first encountered [name] in the year 2003, when she was diagnosed with a cerebral aneurysm which was coiled in 2004 by Dr. Corliss Skains. Her husband has meanwhile developed dementia and in have seen him for the memory loss.  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   I reviewed [name] is lower results beginning in September 2011 and sore May 2014, electrolytes were normal, renal function was normal liver function tests were normal. She had normal urine analysis. CBC she can still have a higher  lymphocyte count and lower neutrophil count is has been unchanged for many years. Her ferritin was only 10.9 down from 16.4 nanogram  in August last year.  Cholesterol is elevated,  glucose is elevated.     Review of Systems: Out of a complete 14 system review, the patient complains of only the following symptoms, and all other reviewed systems are negative.   burning sensation, essential tremor with vocal tremor. Obesity. Staggering gait.  Also reports poor sleep and excessive daytime sleepiness. Her husband has reported that she makes strange noises at night but couldn't further differentiate. Fall risk assesmnt 10 points, Epworth 12 , Fss 40 points.   History   Social History  . Marital Status: Married    Spouse Name: N/A    Number of Children: 0  . Years of Education: N/A   Occupational History  . retired    Social History Main Topics  . Smoking status: Never Smoker   . Smokeless tobacco: Never Used  . Alcohol Use: No  . Drug Use: No  . Sexually Active: Not on file   Other Topics Concern  . Not on file   Social History Narrative  . No narrative on file    Family History  Problem Relation Age of Onset  . Ovarian cancer Mother   . Diabetes Brother   . Hypertension Brother   . Heart disease Father   . Kidney disease  Father   . Heart disease Brother   . Diabetes Brother   . Heart disease Brother   . Microcephaly Brother   . Diabetes    . Colon cancer    . Diabetes Paternal Grandmother     Past Medical History  Diagnosis Date  . Anxiety   . Migraine   . DM (diabetes mellitus)   . GERD (gastroesophageal reflux disease)   . HLD (hyperlipidemia)   . HTN (hypertension)   . History of cerebral aneurysm repair     s/p coiling  . Peripheral neuropathy   . IBS (irritable bowel syndrome)   . Diverticulosis   . Adenomatous colon polyp 1970     carcinoma in situ  . History of hemorrhoids     with bleeding  . Hiatal hernia 02/03/05  . History of shingles   .  UTI (lower urinary tract infection)   . DDD (degenerative disc disease)   . Iron deficiency   . Acute torn meniscus of knee   . Toe fracture, right     second toe  . Fundic gland polyposis of stomach   . Duodenitis     peptic, with gastric heterotopia  . Endometriosis     s/p hysterectomy  . MVP (mitral valve prolapse)   . Allergic rhinitis   . Varicose vein   . Osteopenia   . Hypercholesteremia   . Benign essential tremor syndrome     Past Surgical History  Procedure Laterality Date  . Right hand decompressive fasciotomy  08/26/07    , dorsal and volar  . Carpal tunnel release  08/26/07  . Appendectomy    . Colon resection    . Abdominal hysterectomy    . Coronary angioplasty with stent placement    . Cerebral aneurysm repair    . Esophagogastroduodenoscopy  02/03/05    hiatal hernia, 6 benign gastric polyps  . Colonoscopy w/ biopsies and polypectomy  02/03/05    diverticulosis, 4 mm sessile polyps, internal and external hemorrhoids  . Colonoscopy  06/07/08    divertiulosis, internal hemorrhoids  . Tonsillectomy    . Flexible sigmoidoscopy    . Abdominal hysterectomy    . Cerebral aneurysm repair    . Cataract extraction    . Hand surgery Right     Current Outpatient Prescriptions  Medication Sig Dispense Refill  . ALPHA LIPOIC ACID PO Take 600 mg by mouth daily.      Marland Kitchen ALPRAZolam (XANAX) 0.5 MG tablet Take 0.25 mg by mouth daily. Every 12 hours as needed      . atenolol (TENORMIN) 50 MG tablet Take 50 mg by mouth every morning.       . Coenzyme Q10 100 MG capsule Take 100 mg by mouth daily.        . furosemide (LASIX) 20 MG tablet Take 20 mg by mouth daily as needed.        Marland Kitchen glycopyrrolate (ROBINUL-FORTE) 2 MG tablet Take 1 tablet 1-2 times daily as needed for cramping and spasms       . hydrocortisone (ANUSOL-HC) 2.5 % rectal cream Place rectally 2 (two) times daily as needed for hemorrhoids. Apply a small amount to anal area as needed for hemorriods  30 g  0  .  hydrocortisone 1 % cream Apply to rectum as needed       . Insulin Glargine (LANTUS SOLOSTAR) 100 UNIT/ML SOPN Inject 100 Units into the skin daily. Once daily each morning      .  Insulin Pen Needle 32G X 4 MM MISC by Does not apply route. As directed,BD pen needle      . Multiple Vitamin (MULTIVITAMIN) tablet Take 1 tablet by mouth daily.        . Omega-3 Fatty Acids (FISH OIL) 1000 MG CAPS Take 1 capsule by mouth daily.        . pantoprazole (PROTONIX) 40 MG tablet TAKE 1 TABLET EVERY DAY  30 tablet  9  . repaglinide (PRANDIN) 0.5 MG tablet Take 0.5 mg by mouth 3 (three) times daily before meals.      . trandolapril (MAVIK) 4 MG tablet Take 4 mg by mouth daily.         No current facility-administered medications for this visit.    Allergies as of 08/07/2012 - Review Complete 08/07/2012  Allergen Reaction Noted  . Actos (pioglitazone)  08/07/2012  . Amoxicillin  08/07/2012  . Avandia (rosiglitazone)  08/07/2012  . Fosamax (alendronate sodium)  08/07/2012  . Glimepiride  08/07/2012  . Januvia (sitagliptin)  08/07/2012  . Lipitor (atorvastatin)  08/07/2012  . Metformin and related  08/07/2012  . Prandin (repaglinide)  08/07/2012  . Tradjenta (linagliptin)  08/07/2012  . Zoloft (sertraline hcl)  08/07/2012  . Aspirin Other (See Comments)   . Bentyl (dicyclomine hcl)  08/07/2012  . Penicillins Swelling and Rash     Vitals: BP 158/69  Pulse 57  Ht 5' 3.5" (1.613 m)  Wt 170 lb (77.111 kg)  BMI 29.64 kg/m2 Last Weight:  Wt Readings from Last 1 Encounters:  08/07/12 170 lb (77.111 kg)   Last Height:   Ht Readings from Last 1 Encounters:  08/07/12 5' 3.5" (1.613 m)   Vision Screening:  See Dr. Randon Goldsmith note   Physical exam:  General: The patient is awake, alert and appears not in acute distress. The patient is well groomed. Head: Normocephalic, atraumatic. Neck is supple. Mallampati 3 . With the uvula touching the tongue ground  neck circumference: 14.5  Cardiovascular:    Irreg. rate and rhythm, with click  murmurs or carotid bruit, and without distended neck veins. Respiratory: Lungs are clear to auscultation. Skin:   evidence of  Ankle edema, not  rash Trunk: BMI is elevated and patient  has normal posture.  Neurologic exam : The patient is awake and alert, oriented to place and time.  Memory subjective  described as intact. There is a normal attention span & concentration ability. Speech is fluent without  Dysarthria, mild  dysphonia , not  aphasia. Mood and affect are appropriate.  Cranial nerves: Pupils are equal and briskly reactive to light. Funduscopic exam without  evidence of  Edema- ,  More palor on the right, no clear  cotton wool lesions.  Extraocular movements  in vertical and horizontal planes intact and without nystagmus. Visual fields by finger perimetry are intact. Hearing to finger rub intact.  Facial sensation intact to fine touch. Facial motor strength is symmetric and tongue and uvula move midline.  Motor exam:   Normal tone and normal muscle bulk and symmetric normal strength in all extremities.  Her grip is weaker on the right, since an infiltration by IV fluid.   Sensory:  Fine touch, pinprick and vibration were tested in all extremities. Proprioception is normal.   At and below ankle numbness bilat.  right over left.   Coordination: Rapid alternating movements in the fingers/hands is tested and normal. Finger-to-nose maneuver without evidence of ataxia, dysmetria or tremor.  Gait and station: Patient  walks with a walker , cannot safely turn, cannot safely bend , uses in the house a cane  assistive device .   Gait strength below normal limits. Stance is stable and normal. Tandem gait is deferred.  Deep tendon reflexes: in the  upper and lower extremities are symmetric and intact. Babinski maneuver response is a equivocal.     Assessment:  After physical and neurologic examination, review of laboratory studies, imaging, neurophysiology  testing and pre-existing records, assessment will be reviewed on the problem list.   The patient reports profound numbness in both feet but onset S. first in the right foot, this keeps her from driving longer distances as she cannot work the pedals. It has affected her ability to walk and she uses an assistive device in form of a walker all the time. Last summer she was still able to ambulate with a walker. There is significant ankle edema and she seems to be weaker on the right than on the left side of her body even when seated she is slightly slumped towards the right. I suspect that her diabetic origin for her neuropathy is likely,   but the right-sided dominance also makes evaluation of the brain as a possible cause necessary Again the patient has a history of cerebral aneurysms and an MRI and MR a would be necessary. This will also address the possible " amaurosis " ( no curtain phenomenon- blurred vision )   Resulting  with transient loss of vision, painless about 10 days ago in the morning.  The patient is considered home bound and had physical therapy in the hall 18 months ago after she broke her to call. I would like for her to get neuro rehabilitation specific gait stabilization training periods the problem will be that [name] cannot drive a longer distance and also that she has to look after her husband. She indicated would be difficult to attend physical therapy outside the home. The patient has a multifactorial gait disorder including neuropathy, weakness, edema, and visual impairment.   I will order carotid Doppler studies of the left and right neck patient has a history of a heart murmur and an emboli monitoring study should be performed as well.  Plan:  Treatment plan and additional workup will be reviewed under Problem List.

## 2012-08-08 ENCOUNTER — Other Ambulatory Visit: Payer: Self-pay | Admitting: Neurology

## 2012-08-08 DIAGNOSIS — G453 Amaurosis fugax: Secondary | ICD-10-CM

## 2012-08-13 ENCOUNTER — Other Ambulatory Visit: Payer: Medicare Other

## 2012-08-13 ENCOUNTER — Other Ambulatory Visit: Payer: Self-pay | Admitting: Neurology

## 2012-08-13 DIAGNOSIS — H3400 Transient retinal artery occlusion, unspecified eye: Secondary | ICD-10-CM

## 2012-08-16 ENCOUNTER — Other Ambulatory Visit (INDEPENDENT_AMBULATORY_CARE_PROVIDER_SITE_OTHER): Payer: Medicare Other

## 2012-08-16 ENCOUNTER — Ambulatory Visit (INDEPENDENT_AMBULATORY_CARE_PROVIDER_SITE_OTHER): Payer: Medicare Other

## 2012-08-16 DIAGNOSIS — H3400 Transient retinal artery occlusion, unspecified eye: Secondary | ICD-10-CM

## 2012-08-16 DIAGNOSIS — Z0289 Encounter for other administrative examinations: Secondary | ICD-10-CM

## 2012-08-16 DIAGNOSIS — I671 Cerebral aneurysm, nonruptured: Secondary | ICD-10-CM

## 2012-08-16 DIAGNOSIS — H5310 Unspecified subjective visual disturbances: Secondary | ICD-10-CM

## 2012-08-17 ENCOUNTER — Encounter: Payer: Self-pay | Admitting: Neurology

## 2012-08-17 LAB — TSH+FREE T4: Free T4: 1.3 ng/dL (ref 0.82–1.77)

## 2012-08-20 ENCOUNTER — Ambulatory Visit
Admission: RE | Admit: 2012-08-20 | Discharge: 2012-08-20 | Disposition: A | Discharge status: Home / Self Care | Payer: Medicare Other | Source: Ambulatory Visit | Attending: Neurology | Admitting: Neurology

## 2012-08-20 DIAGNOSIS — I671 Cerebral aneurysm, nonruptured: Secondary | ICD-10-CM

## 2012-08-20 DIAGNOSIS — G609 Hereditary and idiopathic neuropathy, unspecified: Secondary | ICD-10-CM

## 2012-08-20 DIAGNOSIS — G453 Amaurosis fugax: Secondary | ICD-10-CM

## 2012-08-20 DIAGNOSIS — H3400 Transient retinal artery occlusion, unspecified eye: Secondary | ICD-10-CM

## 2012-08-20 MED ORDER — GADOBENATE DIMEGLUMINE 529 MG/ML IV SOLN
15.0000 mL | Freq: Once | INTRAVENOUS | Status: AC | PRN
  Administered 2012-08-20: 15 mL via INTRAVENOUS

## 2012-08-28 ENCOUNTER — Telehealth: Payer: Self-pay | Admitting: Neurology

## 2012-08-31 ENCOUNTER — Telehealth: Payer: Self-pay | Admitting: Neurology

## 2012-08-31 ENCOUNTER — Encounter: Payer: Self-pay | Admitting: Neurology

## 2012-08-31 NOTE — Progress Notes (Signed)
Quick Note:  Please call [name] and relate MRI results- unchanged since 2006- . See below . CD ______

## 2012-08-31 NOTE — Progress Notes (Signed)
Quick Note:  Spoke with patient and relayed the results of their MRI and MRA(s) performed on 08/20/12. The patient understood and had no questions.  ______

## 2012-08-31 NOTE — Telephone Encounter (Signed)
Patient calling and would like her MRA and Doppler results. They were done 2 weeks ago.

## 2012-08-31 NOTE — Progress Notes (Signed)
Quick Note:  Please relate normal NECK MRA results .  See report and MRI /MRA brain reports . Marland Kitchen CD ______

## 2012-08-31 NOTE — Progress Notes (Signed)
Quick Note:  Please relate Unchanged MRA-MRI results since 2012 and 2006. CD ______

## 2012-09-03 ENCOUNTER — Telehealth: Payer: Self-pay | Admitting: Neurology

## 2012-09-03 NOTE — Telephone Encounter (Signed)
I spoke to patient and relayed results, per Dr. Vickey Huger.  I will mail patient a copy.

## 2012-09-07 NOTE — Progress Notes (Signed)
Quick Note:  Patient has been notified of MRA and MRI brain results. ______

## 2012-09-07 NOTE — Progress Notes (Signed)
Quick Note:  Spoke to patient and relayed normal MRA of Neck, per Dr. Vickey Huger. ______

## 2012-09-12 ENCOUNTER — Institutional Professional Consult (permissible substitution): Payer: Medicare Other | Admitting: Neurology

## 2012-10-06 ENCOUNTER — Encounter (HOSPITAL_COMMUNITY): Payer: Self-pay | Admitting: Emergency Medicine

## 2012-10-06 ENCOUNTER — Emergency Department (HOSPITAL_COMMUNITY)
Admission: EM | Admit: 2012-10-06 | Discharge: 2012-10-06 | Disposition: A | Discharge status: Home / Self Care | Payer: Medicare Other | Attending: Emergency Medicine | Admitting: Emergency Medicine

## 2012-10-06 ENCOUNTER — Emergency Department (HOSPITAL_COMMUNITY): Payer: Medicare Other

## 2012-10-06 DIAGNOSIS — Z87828 Personal history of other (healed) physical injury and trauma: Secondary | ICD-10-CM | POA: Insufficient documentation

## 2012-10-06 DIAGNOSIS — Z8619 Personal history of other infectious and parasitic diseases: Secondary | ICD-10-CM | POA: Insufficient documentation

## 2012-10-06 DIAGNOSIS — E119 Type 2 diabetes mellitus without complications: Secondary | ICD-10-CM | POA: Insufficient documentation

## 2012-10-06 DIAGNOSIS — Z8669 Personal history of other diseases of the nervous system and sense organs: Secondary | ICD-10-CM | POA: Insufficient documentation

## 2012-10-06 DIAGNOSIS — Z9889 Other specified postprocedural states: Secondary | ICD-10-CM | POA: Insufficient documentation

## 2012-10-06 DIAGNOSIS — M79605 Pain in left leg: Secondary | ICD-10-CM

## 2012-10-06 DIAGNOSIS — M79609 Pain in unspecified limb: Secondary | ICD-10-CM | POA: Insufficient documentation

## 2012-10-06 DIAGNOSIS — K219 Gastro-esophageal reflux disease without esophagitis: Secondary | ICD-10-CM | POA: Insufficient documentation

## 2012-10-06 DIAGNOSIS — R011 Cardiac murmur, unspecified: Secondary | ICD-10-CM | POA: Insufficient documentation

## 2012-10-06 DIAGNOSIS — Z87440 Personal history of urinary (tract) infections: Secondary | ICD-10-CM | POA: Insufficient documentation

## 2012-10-06 DIAGNOSIS — Z860100 Personal history of colon polyps, unspecified: Secondary | ICD-10-CM | POA: Insufficient documentation

## 2012-10-06 DIAGNOSIS — F411 Generalized anxiety disorder: Secondary | ICD-10-CM | POA: Insufficient documentation

## 2012-10-06 DIAGNOSIS — Z794 Long term (current) use of insulin: Secondary | ICD-10-CM | POA: Insufficient documentation

## 2012-10-06 DIAGNOSIS — I10 Essential (primary) hypertension: Secondary | ICD-10-CM | POA: Insufficient documentation

## 2012-10-06 DIAGNOSIS — Z9861 Coronary angioplasty status: Secondary | ICD-10-CM | POA: Insufficient documentation

## 2012-10-06 DIAGNOSIS — Z880 Allergy status to penicillin: Secondary | ICD-10-CM | POA: Insufficient documentation

## 2012-10-06 DIAGNOSIS — Z862 Personal history of diseases of the blood and blood-forming organs and certain disorders involving the immune mechanism: Secondary | ICD-10-CM | POA: Insufficient documentation

## 2012-10-06 DIAGNOSIS — Z8709 Personal history of other diseases of the respiratory system: Secondary | ICD-10-CM | POA: Insufficient documentation

## 2012-10-06 DIAGNOSIS — Z79899 Other long term (current) drug therapy: Secondary | ICD-10-CM | POA: Insufficient documentation

## 2012-10-06 LAB — CBC WITH DIFFERENTIAL/PLATELET
Basophils Absolute: 0 10*3/uL (ref 0.0–0.1)
Basophils Relative: 0 % (ref 0–1)
Hemoglobin: 11.9 g/dL — ABNORMAL LOW (ref 12.0–15.0)
MCHC: 32 g/dL (ref 30.0–36.0)
Neutro Abs: 4.4 10*3/uL (ref 1.7–7.7)
Neutrophils Relative %: 39 % — ABNORMAL LOW (ref 43–77)
Platelets: 217 10*3/uL (ref 150–400)
RDW: 16 % — ABNORMAL HIGH (ref 11.5–15.5)

## 2012-10-06 LAB — BASIC METABOLIC PANEL
Chloride: 105 mEq/L (ref 96–112)
GFR calc Af Amer: >90 mL/min (ref >90)
GFR calc non Af Amer: 81 mL/min — ABNORMAL LOW (ref >90)
Potassium: 3.7 mEq/L (ref 3.5–5.1)
Sodium: 139 mEq/L (ref 135–145)

## 2012-10-06 LAB — D-DIMER, QUANTITATIVE: D-Dimer, Quant: 0.39 ug/mL-FEU (ref 0.00–0.48)

## 2012-10-06 NOTE — ED Provider Notes (Signed)
CSN: [id number]     Arrival date & time 10/06/12  1920 History   First MD Initiated Contact with Patient 10/06/12 1927     Chief Complaint  Patient presents with  . Leg Pain   HPI Patient presents to the emergency room for evaluation of left leg pain. Patient states she started feeling pain in her left side today it radiated down towards her left ankle. The patient states the pain is sharp and primarily located in the left side. She can palpate in that area and reproduces her pain. She did have some pain in her lower back earlier in the day but does not have any pain right now. Patient denies any new swelling. She denies any rash . She denies any chest pain or shortness of breath.,  Patient does have history of neuropathy and has had pain intermittently in her left leg. However, she does have a history of DVT remotely. She was concerned that her pain today could be associated with that. She called the doctor on call and was told to come to the emergency department for evaluation Past Medical History  Diagnosis Date  . Anxiety   . Migraine   . DM (diabetes mellitus)   . GERD (gastroesophageal reflux disease)   . HLD (hyperlipidemia)   . HTN (hypertension)   . History of cerebral aneurysm repair     s/p coiling  . Peripheral neuropathy   . IBS (irritable bowel syndrome)   . Diverticulosis   . Adenomatous colon polyp 1970     carcinoma in situ  . History of hemorrhoids     with bleeding  . Hiatal hernia 02/03/05  . History of shingles   . UTI (lower urinary tract infection)   . DDD (degenerative disc disease)   . Iron deficiency   . Acute torn meniscus of knee   . Toe fracture, right     second toe  . Fundic gland polyposis of stomach   . Duodenitis     peptic, with gastric heterotopia  . Endometriosis     s/p hysterectomy  . MVP (mitral valve prolapse)   . Allergic rhinitis   . Varicose vein   . Osteopenia   . Hypercholesteremia   . Benign essential tremor syndrome   . Heart  murmur   . Amaurosis fugax 08/07/2012   Past Surgical History  Procedure Laterality Date  . Right hand decompressive fasciotomy  08/26/07    , dorsal and volar  . Carpal tunnel release  08/26/07  . Appendectomy    . Colon resection    . Abdominal hysterectomy    . Coronary angioplasty with stent placement    . Cerebral aneurysm repair    . Esophagogastroduodenoscopy  02/03/05    hiatal hernia, 6 benign gastric polyps  . Colonoscopy w/ biopsies and polypectomy  02/03/05    diverticulosis, 4 mm sessile polyps, internal and external hemorrhoids  . Colonoscopy  06/07/08    divertiulosis, internal hemorrhoids  . Tonsillectomy    . Flexible sigmoidoscopy    . Abdominal hysterectomy    . Cerebral aneurysm repair    . Cataract extraction    . Hand surgery Right   . Intraocular lens insertion     Family History  Problem Relation Age of Onset  . Ovarian cancer Mother   . Diabetes Brother   . Hypertension Brother   . Heart disease Father   . Kidney disease Father   . Heart disease Brother   . Diabetes  Brother   . Heart disease Brother   . Microcephaly Brother   . Diabetes    . Colon cancer    . Diabetes Paternal Grandmother    History  Substance Use Topics  . Smoking status: Never Smoker   . Smokeless tobacco: Never Used  . Alcohol Use: No   OB History   Grav Para Term Preterm Abortions TAB SAB Ect Mult Living                 Review of Systems  All other systems reviewed and are negative.    Allergies  Actos; Amoxicillin; Avandia; Fosamax; Glimepiride; Januvia; Lipitor; Metformin and related; Prandin; Tradjenta; Zoloft; Aspirin; Bentyl; and Penicillins  Home Medications   Current Outpatient Rx  Name  Route  Sig  Dispense  Refill  . acetaminophen (TYLENOL) 500 MG tablet   Oral   Take 500 mg by mouth every 6 (six) hours as needed for pain.         . ALPHA LIPOIC ACID PO   Oral   Take 600 mg by mouth daily.         Marland Kitchen ALPRAZolam (XANAX) 0.5 MG tablet   Oral    Take 1 tablet (0.5 mg total) by mouth daily. Every 12 hours as needed   30 tablet   2   . aspirin EC 81 MG tablet   Oral   Take 81 mg by mouth once.         Marland Kitchen atenolol (TENORMIN) 50 MG tablet   Oral   Take 50 mg by mouth every morning.          . Coenzyme Q10 100 MG capsule   Oral   Take 100 mg by mouth daily.           . furosemide (LASIX) 20 MG tablet   Oral   Take 20 mg by mouth 3 (three) times a week.          Marland Kitchen glycopyrrolate (ROBINUL-FORTE) 2 MG tablet      Take 1 tablet 1-2 times daily as needed for cramping and spasms          . hydrocortisone (ANUSOL-HC) 2.5 % rectal cream   Rectal   Place rectally 2 (two) times daily as needed for hemorrhoids. Apply a small amount to anal area as needed for hemorriods   30 g   0   . Insulin Glargine (LANTUS SOLOSTAR) 100 UNIT/ML SOPN   Subcutaneous   Inject 20 Units into the skin daily. Once daily each morning         . Insulin Pen Needle 32G X 4 MM MISC   Does not apply   by Does not apply route. As directed,BD pen needle         . Multiple Vitamin (MULTIVITAMIN) tablet   Oral   Take 1 tablet by mouth daily.           . pantoprazole (PROTONIX) 40 MG tablet      TAKE 1 TABLET EVERY DAY   30 tablet   9   . trandolapril (MAVIK) 4 MG tablet   Oral   Take 4 mg by mouth daily.            BP 170/74  Pulse 62  Temp(Src) 97.7 F (36.5 C) (Oral)  Resp 18  Ht 5\' 3"  (1.6 m)  Wt 167 lb (75.751 kg)  BMI 29.59 kg/m2  SpO2 98% Physical Exam  Nursing note and vitals reviewed. Constitutional:  She appears well-developed and well-nourished. No distress.  HENT:  Head: Normocephalic and atraumatic.  Right Ear: External ear normal.  Left Ear: External ear normal.  Eyes: Conjunctivae are normal. Right eye exhibits no discharge. Left eye exhibits no discharge. No scleral icterus.  Neck: Neck supple. No tracheal deviation present.  Cardiovascular: Normal rate, regular rhythm and intact distal pulses.    Pulmonary/Chest: Effort normal and breath sounds normal. No stridor. No respiratory distress. She has no wheezes. She has no rales.  Abdominal: Soft. Bowel sounds are normal. She exhibits no distension. There is no tenderness. There is no rebound and no guarding.  Musculoskeletal: She exhibits tenderness. She exhibits no edema.  Tenderness palpation left medial thigh, mild pain with range of motion left hip, no calf tenderness, no edema foot, no erythema, normal pulses bilaterally  Neurological: She is alert. She has normal strength. No sensory deficit. Cranial nerve deficit:  no gross defecits noted. She exhibits normal muscle tone. She displays no seizure activity. Coordination normal.  Skin: Skin is warm and dry. No rash noted.  Psychiatric: She has a normal mood and affect.    ED Course  Procedures (including critical care time) Labs Review Labs Reviewed  CBC WITH DIFFERENTIAL - Abnormal; Notable for the following:    WBC 11.1 (*)    Hemoglobin 11.9 (*)    MCV 76.1 (*)    MCH 24.3 (*)    RDW 16.0 (*)    Neutrophils Relative % 39 (*)    Lymphocytes Relative 53 (*)    Lymphs Abs 5.8 (*)    All other components within normal limits  BASIC METABOLIC PANEL - Abnormal; Notable for the following:    Glucose, Bld 224 (*)    GFR calc non Af Amer 81 (*)    All other components within normal limits  D-DIMER, QUANTITATIVE   Imaging Review Dg Hip Complete Left  10/06/2012   *RADIOLOGY REPORT*  Clinical Data: Pain at left hip.  No injury.  LEFT HIP - COMPLETE 2+ VIEW  Comparison: 11/08/2011 abdominal pelvic CT.  Findings: Femoral heads are located.  Bilateral mild hip osteoarthritis.  Mild osteopenia. No acute fracture.  IMPRESSION: Degenerative change, without acute osseous finding.   Original Report Authenticated By: Jeronimo Greaves, M.D.    MDM   1. Leg pain, left    The patient was sent to the emergency room to evaluate for possible DVT.  Doppler ultrasound was not available in the  emergency department at this time of day.  However, the low suspicion for DVT based on her exam and findings. She also had a negative D-dimer test. Her symptoms are more suggestive of either a radiculopathy or possibly related to neuropathy.  I doubt infection, acute vascular injury, or other emergent condition.  I discussed the findings with the patient and husband. She is reassured and comfortable with discharge.  She has medications at home that she can take as needed    Celene Kras, MD 10/06/12 2123

## 2012-10-06 NOTE — ED Notes (Signed)
Pt. Came in with complaint of left leg pain @8 /10 which started yesterday in her left middle thigh and today she felt that pain is going down to left ankle . Denied fall , reported of having diagnosed of neuropathy on her right leg 10 years ago. No injury reported. Denies SOB.

## 2012-10-06 NOTE — ED Notes (Signed)
Patient transported to X-ray 

## 2012-11-20 ENCOUNTER — Ambulatory Visit (INDEPENDENT_AMBULATORY_CARE_PROVIDER_SITE_OTHER): Payer: Medicare Other | Admitting: Neurology

## 2012-11-20 ENCOUNTER — Encounter (INDEPENDENT_AMBULATORY_CARE_PROVIDER_SITE_OTHER): Payer: Self-pay

## 2012-11-20 ENCOUNTER — Encounter: Payer: Self-pay | Admitting: Neurology

## 2012-11-20 VITALS — BP 123/59 | HR 58 | Resp 16 | Wt 171.0 lb

## 2012-11-20 DIAGNOSIS — G609 Hereditary and idiopathic neuropathy, unspecified: Secondary | ICD-10-CM

## 2012-11-20 DIAGNOSIS — I671 Cerebral aneurysm, nonruptured: Secondary | ICD-10-CM

## 2012-11-20 NOTE — Progress Notes (Signed)
[facility]  Provider:  Melvyn Novas, M D  Referring Provider: Sissy Hoff, MD Primary Care Physician:  Sissy Hoff, MD  Chief Complaint  Patient presents with  . 2 mo f/u    Pt had her vision checked today Rt eye 20/40 Lt eye 20/40 uncorrected    HPI:  [name] is a 79 y.o. female  Is seen here as a  revisit  ( Dr. Azucena Cecil)  for  MRI follow up. Last seen here  as a referral from Dr. Hazle Quant for a sudden loss of vision, blurring in September 2014 . She had been seen by her ophthalmologist , who knew  about her history of cerebral aneurysm.   Her last MRI showed no return and no increase in size of her known aneurysm.   [name] reports feeling better since the last visit, and has found more energy. She seems less depressed.  She is not on any antidepressant medication but has been taking XANAX for several years as a sleep aid. She felt that getting good rest was the reason for feeling better.  I discuss today the recent discussion of benzodiazepines suggesting a higher risk of developing  Alzheimer's dementia. She is willing to try OTC  Such Tylenol PM or similar Unisom. She reports some name finding difficulties and words sometimes not coming to her mid sentence.     Previously, I  followed the patient in  2008 last - for aneurysm follow up. The patient reported that she walk up being unable to see anything, managed to go to the bathroom and to try to wash her eyes as she felt there was some film covering the eyes . She states her blindness/ darkness turned into blurring , she could see something at about 30 minutes- but shut her left eye - perhaps this was diplopia? But the blurred vision lasted for another 2-3 days.  During that time, she saw Dr. Randon Goldsmith office associate and was found to have no significant abnormalities.  Also told me that she lost 3 people she was closed to in the last 9 months, including her 27 year old nephew, her last remaining  brother, and her best friend of 60 years.  Her husband suffers form dementia.   She has meanwhile become diabetic, neuropathic and retinopathic.   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     Review of Systems:  Out of a complete 14 system review, the patient complains of only the following symptoms, and all other reviewed systems are negative.  burning sensation, essential tremor with vocal tremor. Obesity. Staggering gait.  Also reports poor sleep and excessive daytime sleepiness. Her husband has reported that she makes strange noises at night but couldn't further differentiate.  Fall risk assesment at 10 points,  Epworth 12 ,  FSS 38 points.     History   Social History  . Marital Status: Married    Spouse Name: N/A    Number of Children: 0  . Years of Education: N/A   Occupational History  . retired    Social History Main Topics  . Smoking status: Never Smoker   . Smokeless tobacco: Never Used  . Alcohol Use: No  . Drug Use: No  . Sexual Activity: Not on file   Other Topics Concern  . Not on file   Social History Narrative  . No narrative on file    Family History  Problem Relation Age of Onset  . Ovarian cancer  Mother   . Diabetes Brother   . Hypertension Brother   . Heart disease Father   . Kidney disease Father   . Heart disease Brother   . Diabetes Brother   . Heart disease Brother   . Microcephaly Brother   . Diabetes    . Colon cancer    . Diabetes Paternal Grandmother     Past Medical History  Diagnosis Date  . Anxiety   . Migraine   . DM (diabetes mellitus)   . GERD (gastroesophageal reflux disease)   . HLD (hyperlipidemia)   . HTN (hypertension)   . History of cerebral aneurysm repair     s/p coiling  . Peripheral neuropathy   . IBS (irritable bowel syndrome)   . Diverticulosis   . Adenomatous colon polyp 1970     carcinoma in situ  . History of hemorrhoids     with bleeding  . Hiatal hernia 02/03/05  . History of  shingles   . UTI (lower urinary tract infection)   . DDD (degenerative disc disease)   . Iron deficiency   . Acute torn meniscus of knee   . Toe fracture, right     second toe  . Fundic gland polyposis of stomach   . Duodenitis     peptic, with gastric heterotopia  . Endometriosis     s/p hysterectomy  . MVP (mitral valve prolapse)   . Allergic rhinitis   . Varicose vein   . Osteopenia   . Hypercholesteremia   . Benign essential tremor syndrome   . Heart murmur   . Amaurosis fugax 08/07/2012    Past Surgical History  Procedure Laterality Date  . Right hand decompressive fasciotomy  08/26/07    , dorsal and volar  . Carpal tunnel release  08/26/07  . Appendectomy    . Colon resection    . Abdominal hysterectomy    . Coronary angioplasty with stent placement    . Cerebral aneurysm repair    . Esophagogastroduodenoscopy  02/03/05    hiatal hernia, 6 benign gastric polyps  . Colonoscopy w/ biopsies and polypectomy  02/03/05    diverticulosis, 4 mm sessile polyps, internal and external hemorrhoids  . Colonoscopy  06/07/08    divertiulosis, internal hemorrhoids  . Tonsillectomy    . Flexible sigmoidoscopy    . Abdominal hysterectomy    . Cerebral aneurysm repair    . Cataract extraction    . Hand surgery Right   . Intraocular lens insertion      Current Outpatient Prescriptions  Medication Sig Dispense Refill  . ALPHA LIPOIC ACID PO Take 600 mg by mouth daily.      Marland Kitchen ALPRAZolam (XANAX) 0.5 MG tablet Take 1 tablet (0.5 mg total) by mouth daily. Every 12 hours as needed  30 tablet  2  . Coenzyme Q10 100 MG capsule Take 100 mg by mouth daily.        . furosemide (LASIX) 20 MG tablet Take 20 mg by mouth 3 (three) times a week.       Marland Kitchen glycopyrrolate (ROBINUL-FORTE) 2 MG tablet Take 1 tablet 1-2 times daily as needed for cramping and spasms       . Insulin Glargine (LANTUS SOLOSTAR) 100 UNIT/ML SOPN Inject 20 Units into the skin daily. Once daily each morning      . Insulin Pen  Needle 32G X 4 MM MISC by Does not apply route. As directed,BD pen needle      . Multiple  Vitamin (MULTIVITAMIN) tablet Take 1 tablet by mouth daily.        . pantoprazole (PROTONIX) 40 MG tablet TAKE 1 TABLET EVERY DAY  30 tablet  9  . trandolapril (MAVIK) 4 MG tablet Take 4 mg by mouth daily.        Marland Kitchen acetaminophen (TYLENOL) 500 MG tablet Take 500 mg by mouth every 6 (six) hours as needed for pain.      . hydrocortisone (ANUSOL-HC) 2.5 % rectal cream Place rectally 2 (two) times daily as needed for hemorrhoids. Apply a small amount to anal area as needed for hemorriods  30 g  0   No current facility-administered medications for this visit.    Allergies as of 11/20/2012 - Review Complete 11/20/2012  Allergen Reaction Noted  . Actos [pioglitazone]  08/07/2012  . Amoxicillin  08/07/2012  . Avandia [rosiglitazone]  08/07/2012  . Fosamax [alendronate sodium]  08/07/2012  . Glimepiride  08/07/2012  . Januvia [sitagliptin]  08/07/2012  . Lipitor [atorvastatin]  08/07/2012  . Metformin and related  08/07/2012  . Prandin [repaglinide]  08/07/2012  . Tradjenta [linagliptin]  08/07/2012  . Zoloft [sertraline hcl]  08/07/2012  . Aspirin Other (See Comments)   . Bentyl [dicyclomine hcl]  08/07/2012  . Penicillins Swelling and Rash     Vitals: BP 123/59  Pulse 58  Wt 171 lb (77.565 kg)  BMI 30.3 kg/m2 Last Weight:  Wt Readings from Last 1 Encounters:  11/20/12 171 lb (77.565 kg)   Last Height:   Ht Readings from Last 1 Encounters:  10/06/12 5\' 3"  (1.6 m)   :Neurologic exam :  The patient is awake and alert, oriented to place and time. Memory subjective described as intact. There is a normal attention span & concentration ability.  MOCA 30 out of 30  Points-  Speech is fluent without Dysarthria, mild dysphonia , not aphasia. Mood and affect are appropriate.  There is some oro-fascial dyskinesia, automatism with some lip smacking.  Cranial nerves:  Pupils are equal and briskly reactive  to light. Funduscopic exam without evidence of Edema . Extraocular movements in vertical and horizontal planes intact and without nystagmus. Visual fields by finger perimetry are intact.  Hearing to finger rub intact. Facial sensation intact to fine touch. Facial motor strength is symmetric - tongue and uvula move midline.  Motor exam: Normal tone and normal muscle bulk and symmetric normal strength in all extremities.  Her grip is weaker on the right, since an infiltration by IV fluid.  Sensory: Fine touch, pinprick and vibration were tested in all extremities. Numbness in the right foot , loss of proprioception at and below ankle;  numbness bilat. Present but  right over left.  Coordination: Rapid alternating movements in the fingers/hands is normal. Finger-to-nose testing  without evidence of ataxia, dysmetria or tremor.  Gait and station: Patient walks with a walker , cannot safely turn, cannot safely bend , uses cane inside her  house , walker outside .  Gait strength reduced . Stance is stable and normal. Tandem gait was deferred.  Deep tendon reflexes: in the upper and lower extremities are symmetric and intact. Babinski maneuver response is  equivocal.     Assessment:  After physical and neurologic examination, review of laboratory studies, imaging, neurophysiology testing and pre-existing records, 1) assessment is that of a stable intracerebral aneurysm size , no bleed. Unclear if  the aneurysm had been a cause for the visual changes.  2) gait instability . 3) memory mild  impairment. 4) insomnia,  Plan:  Treatment plan and additional workup : Rv with MOCA  In 12 month.  xanax exchange for Unisom or benadryl.

## 2012-11-20 NOTE — Patient Instructions (Signed)
  Neuropathy affecting the right more than the left foot with associated leg edema and swelling is seen today again. The patient is using a walker as she comes to the office here. Cognitive and motor testing has not shown any sources of construct. The [place] cognitive assessment as was performed today and she scored 30 over 30 points an excellent result. She also denies any acute depression symptoms. There have been no further spells of visual loss as associated before the MRI had not shown any essential change since 2006. I also discussed today that benzodiazepines her possibly associated with memory loss if you wish long-term and in moderate to high doses. 1 I encouraged the patient to try the to replace her a Xanax at night at all 0.25 mg perhaps with you in his thumb or at Tylenol PM or just the Benadryl him this would be a trial she can do back at her old case. I do not see any reason to have any other medications changed. She continues to take oral enzyme Q10 for muscle tone and neuropathy.Peripheral Neuropathy Peripheral neuropathy is a common disorder of your nerves resulting from damage. CAUSES  This disorder may be caused by a disease of the nerves or illness. Many neuropathies have well known causes such as:  Diabetes. This is one of the most common causes.   Uremia.   AIDS.   Nutritional deficiencies.   Other causes include mechanical pressures. These may be from:   Compression.   Injury.   Contusions or bruises.   Fracture or dislocated bones.   Pressure involving the nerves close to the surface. Nerves such as the ulnar, or radial can be injured by prolonged use of crutches.  Other injuries may come from:  Tumor.   Hemorrhage or bleeding into a nerve.   Exposure to cold or radiation.   Certain medicines or toxic substances (rare).   Vascular or collagen disorders such as:   Atherosclerosis.   Systemic lupus erythematosus.   Scleroderma.   Sarcoidosis.    Rheumatoid arthritis.   Polyarteritis nodosa.   A large number of cases are of unknown cause.  SYMPTOMS  Common problems include:  Weakness.   Numbness.   Abnormal sensations (paresthesia) such as:   Burning.   Tickling.   Pricking.   Tingling.   Pain in the arms, hands, legs and/or feet.  TREATMENT  Therapy for this disorder differs depending on the cause. It may vary from medical treatment with medications or physical therapy among others.   For example, therapy for this disorder caused by diabetes involves control of the diabetes.   In cases where a tumor or ruptured disc is the cause, therapy may involve surgery. This would be to remove the tumor or to repair the ruptured disc.   In entrapment or compression neuropathy, treatment may consist of splinting or surgical decompression of the ulnar or median nerves. A common example of entrapment neuropathy is carpal tunnel syndrome. This has become more common because of the increasing use of computers.   Peroneal and radial compression neuropathies may require avoidance of pressure.   Physical therapy and/or splints may be useful in preventing contractures. This is a condition in which shortened muscles around joints cause abnormal and sometimes painful positioning of the joints.  Document Released: 01/07/2002 Document Revised: 09/29/2010 Document Reviewed: 01/17/2005 [facility] Patient Information 2012 [place], [place].

## 2013-04-03 ENCOUNTER — Telehealth: Payer: Self-pay | Admitting: Internal Medicine

## 2013-04-03 NOTE — Telephone Encounter (Signed)
Patient with continued abdominal pain.  Her las office was 05/2012 for the same problem.  I have scheduled for an appt for 05/20/13 and have placed her on the cancellation list

## 2013-05-20 ENCOUNTER — Encounter: Payer: Self-pay | Admitting: Internal Medicine

## 2013-05-20 ENCOUNTER — Ambulatory Visit (INDEPENDENT_AMBULATORY_CARE_PROVIDER_SITE_OTHER): Payer: Medicare Other | Admitting: Internal Medicine

## 2013-05-20 VITALS — BP 132/68 | HR 64 | Ht 62.25 in | Wt 170.4 lb

## 2013-05-20 DIAGNOSIS — R1032 Left lower quadrant pain: Secondary | ICD-10-CM

## 2013-05-20 DIAGNOSIS — M5136 Other intervertebral disc degeneration, lumbar region: Secondary | ICD-10-CM

## 2013-05-20 DIAGNOSIS — K5731 Diverticulosis of large intestine without perforation or abscess with bleeding: Secondary | ICD-10-CM

## 2013-05-20 DIAGNOSIS — M5137 Other intervertebral disc degeneration, lumbosacral region: Secondary | ICD-10-CM

## 2013-05-20 MED ORDER — METRONIDAZOLE 250 MG PO TABS: 250.0000 mg | ORAL_TABLET | Freq: Three times a day (TID) | ORAL | Status: DC

## 2013-05-20 NOTE — Progress Notes (Signed)
Subjective:    Patient ID: [name], female    DOB: 02-21-1932, 80 y.o.   MRN: [id number]  HPI The patient is here for evaluation because of a flare and her left lower quadrant pain. She has IBS but she also has a chronic recurrent left lower quadrant pain that sounds like diverticulitis at times but not been able to prove that. She definitely has diverticulosis on endoscopic and imaging studies. She also has degenerative disc disease in the lumbar spine. When last seen late 2014 I tried moxifloxacin but she could not tolerate that due to palpitations. She got better on her own. Over the past couple of weeks she's had intensifying left lower quadrant pain, it hurts when she defecates and is relieved then, and also certain foods, such as those with roughage and more regular meals as opposed to soft or bland or food seemed to aggravate it. She always has a chronic dull pressure-like pain in the left lower quadrant. She denies urinary problems. She has known intermittent bleeding hemorrhoids. She has chronic low back pain as well. She also has chronic lower extremity pain right worse than left, his neuropathic with paresthesias, this is related to her known idiopathic peripheral neuropathy.  Allergies  Allergen Reactions  . Actos [Pioglitazone]     Chronic  Pedal edema:contraindication  . Amoxicillin     Severe pain in side  . Avandia [Rosiglitazone]     Chronic pedal edema:contraindication  . Fosamax [Alendronate Sodium]   . Glimepiride   . Januvia [Sitagliptin]   . Lipitor [Atorvastatin]   . Metformin And Related     gastritis  . Prandin [Repaglinide]     Abdominal pain: side effects  . Tradjenta [Linagliptin]     GI problems  . Zoloft [Sertraline Hcl]     jittery  . Aspirin Other (See Comments)    Bleeding ulcers   . Bentyl [Dicyclomine Hcl]   . Penicillins Swelling and Rash   Outpatient Prescriptions Prior to Visit  Medication Sig Dispense Refill  . acetaminophen  (TYLENOL) 500 MG tablet Take 500 mg by mouth every 6 (six) hours as needed for pain.      Marland Kitchen ALPRAZolam (XANAX) 0.5 MG tablet Take 1 tablet (0.5 mg total) by mouth daily. Every 12 hours as needed  30 tablet  2  . Coenzyme Q10 100 MG capsule Take 100 mg by mouth daily.        . furosemide (LASIX) 20 MG tablet Take 20 mg by mouth 3 (three) times a week.       Marland Kitchen glycopyrrolate (ROBINUL-FORTE) 2 MG tablet Take 1 tablet 1-2 times daily as needed for cramping and spasms       . hydrocortisone (ANUSOL-HC) 2.5 % rectal cream Place rectally 2 (two) times daily as needed for hemorrhoids. Apply a small amount to anal area as needed for hemorriods  30 g  0  . Insulin Glargine (LANTUS SOLOSTAR) 100 UNIT/ML SOPN Inject 24 Units into the skin daily. Once daily each morning      . Insulin Pen Needle 32G X 4 MM MISC by Does not apply route. As directed,BD pen needle      . Multiple Vitamin (MULTIVITAMIN) tablet Take 1 tablet by mouth daily.        . pantoprazole (PROTONIX) 40 MG tablet TAKE 1 TABLET EVERY DAY  30 tablet  9  . trandolapril (MAVIK) 4 MG tablet Take 4 mg by mouth daily.        Marland Kitchen  ALPHA LIPOIC ACID PO Take 600 mg by mouth daily.       No facility-administered medications prior to visit.   Past Medical History  Diagnosis Date  . Anxiety   . Migraine   . DM (diabetes mellitus)   . GERD (gastroesophageal reflux disease)   . HLD (hyperlipidemia)   . HTN (hypertension)   . History of cerebral aneurysm repair     s/p coiling  . Peripheral neuropathy   . IBS (irritable bowel syndrome)   . Diverticulosis   . Adenomatous colon polyp 1970     carcinoma in situ  . History of hemorrhoids     with bleeding  . Hiatal hernia 02/03/05  . History of shingles   . UTI (lower urinary tract infection)   . DDD (degenerative disc disease)   . Iron deficiency   . Acute torn meniscus of knee   . Toe fracture, right     second toe  . Fundic gland polyposis of stomach   . Duodenitis     peptic, with gastric  heterotopia  . Endometriosis     s/p hysterectomy  . MVP (mitral valve prolapse)   . Allergic rhinitis   . Varicose vein   . Osteopenia   . Hypercholesteremia   . Benign essential tremor syndrome   . Heart murmur   . Amaurosis fugax 08/07/2012   Past Surgical History  Procedure Laterality Date  . Right hand decompressive fasciotomy  08/26/07    , dorsal and volar  . Carpal tunnel release  08/26/07  . Appendectomy    . Colon resection    . Abdominal hysterectomy    . Coronary angioplasty with stent placement    . Cerebral aneurysm repair    . Esophagogastroduodenoscopy  02/03/05    hiatal hernia, 6 benign gastric polyps  . Colonoscopy w/ biopsies and polypectomy  02/03/05    diverticulosis, 4 mm sessile polyps, internal and external hemorrhoids  . Colonoscopy  06/07/08    divertiulosis, internal hemorrhoids  . Tonsillectomy    . Flexible sigmoidoscopy    . Abdominal hysterectomy    . Cerebral aneurysm repair    . Cataract extraction    . Hand surgery Right   . Intraocular lens insertion      Review of Systems As above    Objective:   Physical Exam Elderly [employer] NAD Obese Back - tender lumbar area (mild abd obese and soft w/ mild LLQ tenderness to deep palpation, no mass, no herniae, no wall pain No pain in back or abdomen with LE manipulation    Assessment & Plan:  LLQ pain  Diverticulosis of colon with hemorrhage  Degenerative disc disease, lumbar  She has chronic sxs w/ exacerbation. I suspect multifactorial problem with symptomatic diverticulosis +/- diverticulitis and some component of radicular pain from DJD.  We have decided to treat w/ Abx - try metronidazole 250 mg tid since she tolerates that (many intolerances) If that fails repeat CT abd/pelvis (last 2013) she will call as needed Consider spine surgeon referral or rehab MD referral (? Epidural injection)   CC: Gara Kroner, MD

## 2013-05-20 NOTE — Patient Instructions (Signed)
We have sent the following medications to your pharmacy for you to pick up at your convenience: Generic flagyl  Call us back in 2 weeks with an update in your status.   I appreciate the opportunity to care for you.

## 2013-05-21 ENCOUNTER — Telehealth: Payer: Self-pay | Admitting: Internal Medicine

## 2013-05-21 ENCOUNTER — Emergency Department (HOSPITAL_COMMUNITY): Payer: Medicare Other

## 2013-05-21 ENCOUNTER — Emergency Department (HOSPITAL_COMMUNITY)
Admission: EM | Admit: 2013-05-21 | Discharge: 2013-05-21 | Disposition: A | Discharge status: Home / Self Care | Payer: Medicare Other | Attending: Emergency Medicine | Admitting: Emergency Medicine

## 2013-05-21 ENCOUNTER — Encounter (HOSPITAL_COMMUNITY): Payer: Self-pay | Admitting: Emergency Medicine

## 2013-05-21 DIAGNOSIS — Z8742 Personal history of other diseases of the female genital tract: Secondary | ICD-10-CM | POA: Insufficient documentation

## 2013-05-21 DIAGNOSIS — E119 Type 2 diabetes mellitus without complications: Secondary | ICD-10-CM | POA: Insufficient documentation

## 2013-05-21 DIAGNOSIS — F411 Generalized anxiety disorder: Secondary | ICD-10-CM | POA: Insufficient documentation

## 2013-05-21 DIAGNOSIS — G43909 Migraine, unspecified, not intractable, without status migrainosus: Secondary | ICD-10-CM | POA: Insufficient documentation

## 2013-05-21 DIAGNOSIS — K5732 Diverticulitis of large intestine without perforation or abscess without bleeding: Secondary | ICD-10-CM | POA: Insufficient documentation

## 2013-05-21 DIAGNOSIS — Z860100 Personal history of colon polyps, unspecified: Secondary | ICD-10-CM | POA: Insufficient documentation

## 2013-05-21 DIAGNOSIS — Z792 Long term (current) use of antibiotics: Secondary | ICD-10-CM | POA: Insufficient documentation

## 2013-05-21 DIAGNOSIS — Z79899 Other long term (current) drug therapy: Secondary | ICD-10-CM | POA: Insufficient documentation

## 2013-05-21 DIAGNOSIS — R011 Cardiac murmur, unspecified: Secondary | ICD-10-CM | POA: Insufficient documentation

## 2013-05-21 DIAGNOSIS — IMO0002 Reserved for concepts with insufficient information to code with codable children: Secondary | ICD-10-CM | POA: Insufficient documentation

## 2013-05-21 DIAGNOSIS — Z8781 Personal history of (healed) traumatic fracture: Secondary | ICD-10-CM | POA: Insufficient documentation

## 2013-05-21 DIAGNOSIS — Z9889 Other specified postprocedural states: Secondary | ICD-10-CM | POA: Insufficient documentation

## 2013-05-21 DIAGNOSIS — Z9089 Acquired absence of other organs: Secondary | ICD-10-CM | POA: Insufficient documentation

## 2013-05-21 DIAGNOSIS — K219 Gastro-esophageal reflux disease without esophagitis: Secondary | ICD-10-CM | POA: Insufficient documentation

## 2013-05-21 DIAGNOSIS — Z90710 Acquired absence of both cervix and uterus: Secondary | ICD-10-CM | POA: Insufficient documentation

## 2013-05-21 DIAGNOSIS — Z8619 Personal history of other infectious and parasitic diseases: Secondary | ICD-10-CM | POA: Insufficient documentation

## 2013-05-21 DIAGNOSIS — K5792 Diverticulitis of intestine, part unspecified, without perforation or abscess without bleeding: Secondary | ICD-10-CM

## 2013-05-21 DIAGNOSIS — Z794 Long term (current) use of insulin: Secondary | ICD-10-CM | POA: Insufficient documentation

## 2013-05-21 DIAGNOSIS — Z85038 Personal history of other malignant neoplasm of large intestine: Secondary | ICD-10-CM | POA: Insufficient documentation

## 2013-05-21 DIAGNOSIS — I10 Essential (primary) hypertension: Secondary | ICD-10-CM | POA: Insufficient documentation

## 2013-05-21 DIAGNOSIS — E860 Dehydration: Secondary | ICD-10-CM | POA: Insufficient documentation

## 2013-05-21 DIAGNOSIS — Z862 Personal history of diseases of the blood and blood-forming organs and certain disorders involving the immune mechanism: Secondary | ICD-10-CM | POA: Insufficient documentation

## 2013-05-21 DIAGNOSIS — Z9861 Coronary angioplasty status: Secondary | ICD-10-CM | POA: Insufficient documentation

## 2013-05-21 DIAGNOSIS — Z8669 Personal history of other diseases of the nervous system and sense organs: Secondary | ICD-10-CM | POA: Insufficient documentation

## 2013-05-21 DIAGNOSIS — Z87440 Personal history of urinary (tract) infections: Secondary | ICD-10-CM | POA: Insufficient documentation

## 2013-05-21 DIAGNOSIS — Z880 Allergy status to penicillin: Secondary | ICD-10-CM | POA: Insufficient documentation

## 2013-05-21 HISTORY — DX: Malignant (primary) neoplasm, unspecified: C80.1

## 2013-05-21 LAB — CBC WITH DIFFERENTIAL/PLATELET
BASOS ABS: 0 10*3/uL (ref 0.0–0.1)
Basophils Relative: 0 % (ref 0–1)
EOS ABS: 0.1 10*3/uL (ref 0.0–0.7)
EOS PCT: 1 % (ref 0–5)
HEMATOCRIT: 35.7 % — AB (ref 36.0–46.0)
Hemoglobin: 11.3 g/dL — ABNORMAL LOW (ref 12.0–15.0)
LYMPHS PCT: 26 % (ref 12–46)
Lymphs Abs: 2.6 10*3/uL (ref 0.7–4.0)
MCH: 24.2 pg — AB (ref 26.0–34.0)
MCHC: 31.7 g/dL (ref 30.0–36.0)
MCV: 76.4 fL — AB (ref 78.0–100.0)
MONO ABS: 0.6 10*3/uL (ref 0.1–1.0)
Monocytes Relative: 5 % (ref 3–12)
Neutro Abs: 6.9 10*3/uL (ref 1.7–7.7)
Neutrophils Relative %: 68 % (ref 43–77)
Platelets: 211 10*3/uL (ref 150–400)
RBC: 4.67 MIL/uL (ref 3.87–5.11)
RDW: 15.2 % (ref 11.5–15.5)
WBC: 10.1 10*3/uL (ref 4.0–10.5)

## 2013-05-21 LAB — COMPREHENSIVE METABOLIC PANEL
ALT: 13 U/L (ref 0–35)
AST: 16 U/L (ref 0–37)
Albumin: 3.7 g/dL (ref 3.5–5.2)
Alkaline Phosphatase: 74 U/L (ref 39–117)
BUN: 11 mg/dL (ref 6–23)
CALCIUM: 9.2 mg/dL (ref 8.4–10.5)
CO2: 24 meq/L (ref 19–32)
CREATININE: 0.75 mg/dL (ref 0.50–1.10)
Chloride: 99 mEq/L (ref 96–112)
GFR, EST NON AFRICAN AMERICAN: 79 mL/min — AB (ref >90)
Glucose, Bld: 132 mg/dL — ABNORMAL HIGH (ref 70–99)
Potassium: 4.2 mEq/L (ref 3.7–5.3)
Sodium: 137 mEq/L (ref 137–147)
TOTAL PROTEIN: 7 g/dL (ref 6.0–8.3)
Total Bilirubin: 0.3 mg/dL (ref 0.3–1.2)

## 2013-05-21 LAB — URINALYSIS, ROUTINE W REFLEX MICROSCOPIC
Bilirubin Urine: NEGATIVE
Glucose, UA: NEGATIVE mg/dL
Hgb urine dipstick: NEGATIVE
Ketones, ur: NEGATIVE mg/dL
LEUKOCYTES UA: NEGATIVE
Nitrite: NEGATIVE
PROTEIN: NEGATIVE mg/dL
Specific Gravity, Urine: 1.014 (ref 1.005–1.030)
UROBILINOGEN UA: 0.2 mg/dL (ref 0.0–1.0)
pH: 7 (ref 5.0–8.0)

## 2013-05-21 LAB — LIPASE, BLOOD: Lipase: 12 U/L (ref 11–59)

## 2013-05-21 MED ORDER — SODIUM CHLORIDE 0.9 % IV BOLUS (SEPSIS)
1000.0000 mL | Freq: Once | INTRAVENOUS | Status: AC
  Administered 2013-05-21: 1000 mL via INTRAVENOUS

## 2013-05-21 MED ORDER — AMOXICILLIN-POT CLAVULANATE 875-125 MG PO TABS
1.0000 | ORAL_TABLET | Freq: Once | ORAL | Status: AC
  Administered 2013-05-21: 1 via ORAL
  Filled 2013-05-21: qty 1

## 2013-05-21 MED ORDER — CIPROFLOXACIN HCL 500 MG PO TABS
500.0000 mg | ORAL_TABLET | Freq: Once | ORAL | Status: DC
  Filled 2013-05-21: qty 1

## 2013-05-21 MED ORDER — IOHEXOL 300 MG/ML  SOLN
50.0000 mL | Freq: Once | INTRAMUSCULAR | Status: AC | PRN
  Administered 2013-05-21: 50 mL via ORAL

## 2013-05-21 MED ORDER — CIPROFLOXACIN HCL 500 MG PO TABS: 500.0000 mg | ORAL_TABLET | Freq: Two times a day (BID) | ORAL | Status: DC

## 2013-05-21 MED ORDER — TRAMADOL HCL 50 MG PO TABS
50.0000 mg | ORAL_TABLET | Freq: Once | ORAL | Status: AC
  Administered 2013-05-21: 50 mg via ORAL
  Filled 2013-05-21: qty 1

## 2013-05-21 MED ORDER — ONDANSETRON HCL 4 MG/2ML IJ SOLN
4.0000 mg | Freq: Once | INTRAMUSCULAR | Status: AC
  Administered 2013-05-21: 4 mg via INTRAVENOUS
  Filled 2013-05-21: qty 2

## 2013-05-21 MED ORDER — MORPHINE SULFATE 4 MG/ML IJ SOLN
4.0000 mg | Freq: Once | INTRAMUSCULAR | Status: AC
  Administered 2013-05-21: 4 mg via INTRAVENOUS
  Filled 2013-05-21: qty 1

## 2013-05-21 MED ORDER — IOHEXOL 300 MG/ML  SOLN
100.0000 mL | Freq: Once | INTRAMUSCULAR | Status: AC | PRN
  Administered 2013-05-21: 100 mL via INTRAVENOUS

## 2013-05-21 MED ORDER — AMOXICILLIN-POT CLAVULANATE 875-125 MG PO TABS: 1.0000 | ORAL_TABLET | Freq: Two times a day (BID) | ORAL | Status: DC

## 2013-05-21 MED ORDER — TRAMADOL HCL 50 MG PO TABS: 50.0000 mg | ORAL_TABLET | Freq: Four times a day (QID) | ORAL | Status: DC | PRN

## 2013-05-21 NOTE — Telephone Encounter (Signed)
No answer

## 2013-05-21 NOTE — Telephone Encounter (Signed)
Patient was in the ER. She would like you to review scan and notes.  She was diagnosed with diverticulitis and started on Augmentin,

## 2013-05-21 NOTE — ED Provider Notes (Signed)
CSN: [id number]     Arrival date & time 05/21/13  0901 History   First MD Initiated Contact with Patient 05/21/13 0935     Chief Complaint  Patient presents with  . Abdominal Pain  . Dehydration     (Consider location/radiation/quality/duration/timing/severity/associated sxs/prior Treatment) HPI Comments: Patient presents to the emergency department with chief complaints of abdominal pain. She states the pain started yesterday. She was evaluated by her PCP, and was prescribed Flagyl, however she has not begun the medication yet. She states the pain is located mostly in her left lower quadrant. She states that the pain is moderate to severe. It does not radiate. She denies associated fever, chills, diarrhea, constipation. She does report associated nausea, but without vomiting. She states that she has a history of diverticulosis, and colon cancer. She has had multiple abdominal surgeries in the past, including: Appendectomy, hysterectomy, colon resection.  The history is provided by the patient. No language interpreter was used.    Past Medical History  Diagnosis Date  . Anxiety   . Migraine   . DM (diabetes mellitus)   . GERD (gastroesophageal reflux disease)   . HLD (hyperlipidemia)   . HTN (hypertension)   . History of cerebral aneurysm repair     s/p coiling  . Peripheral neuropathy   . IBS (irritable bowel syndrome)   . Diverticulosis   . Adenomatous colon polyp 1970     carcinoma in situ  . History of hemorrhoids     with bleeding  . Hiatal hernia 02/03/05  . History of shingles   . UTI (lower urinary tract infection)   . DDD (degenerative disc disease)   . Iron deficiency   . Acute torn meniscus of knee   . Toe fracture, right     second toe  . Fundic gland polyposis of stomach   . Duodenitis     peptic, with gastric heterotopia  . Endometriosis     s/p hysterectomy  . MVP (mitral valve prolapse)   . Allergic rhinitis   . Varicose vein   . Osteopenia   .  Hypercholesteremia   . Benign essential tremor syndrome   . Heart murmur   . Amaurosis fugax 08/07/2012   Past Surgical History  Procedure Laterality Date  . Right hand decompressive fasciotomy  08/26/07    , dorsal and volar  . Carpal tunnel release  08/26/07  . Appendectomy    . Colon resection    . Abdominal hysterectomy    . Coronary angioplasty with stent placement    . Cerebral aneurysm repair    . Esophagogastroduodenoscopy  02/03/05    hiatal hernia, 6 benign gastric polyps  . Colonoscopy w/ biopsies and polypectomy  02/03/05    diverticulosis, 4 mm sessile polyps, internal and external hemorrhoids  . Colonoscopy  06/07/08    divertiulosis, internal hemorrhoids  . Tonsillectomy    . Flexible sigmoidoscopy    . Abdominal hysterectomy    . Cerebral aneurysm repair    . Cataract extraction    . Hand surgery Right   . Intraocular lens insertion     Family History  Problem Relation Age of Onset  . Ovarian cancer Mother   . Diabetes Brother   . Hypertension Brother   . Heart disease Father   . Kidney disease Father   . Heart disease Brother   . Diabetes Brother   . Heart disease Brother   . Microcephaly Brother   . Diabetes    .  Colon cancer    . Diabetes Paternal Grandmother    History  Substance Use Topics  . Smoking status: Never Smoker   . Smokeless tobacco: Never Used  . Alcohol Use: No   OB History   Grav Para Term Preterm Abortions TAB SAB Ect Mult Living                 Review of Systems  Constitutional: Negative for fever and chills.  Respiratory: Negative for shortness of breath.   Cardiovascular: Negative for chest pain.  Gastrointestinal: Positive for nausea and abdominal pain. Negative for vomiting, diarrhea and constipation.  Genitourinary: Negative for dysuria.      Allergies  Actos; Amoxicillin; Avandia; Fosamax; Glimepiride; Januvia; Lipitor; Metformin and related; Prandin; Tradjenta; Zoloft; Aspirin; Bentyl; and Penicillins  Home  Medications   Prior to Admission medications   Medication Sig Start Date End Date Taking? Authorizing Provider  acetaminophen (TYLENOL) 500 MG tablet Take 500 mg by mouth every 6 (six) hours as needed for pain.    Historical Provider, MD  ALPRAZolam Duanne Moron) 0.5 MG tablet Take 1 tablet (0.5 mg total) by mouth daily. Every 12 hours as needed 08/07/12   Larey Seat, MD  atenolol (TENORMIN) 50 MG tablet Take 50 mg by mouth daily.    Historical Provider, MD  Coenzyme Q10 100 MG capsule Take 100 mg by mouth daily.      Historical Provider, MD  furosemide (LASIX) 20 MG tablet Take 20 mg by mouth 3 (three) times a week.     Historical Provider, MD  glycopyrrolate (ROBINUL-FORTE) 2 MG tablet Take 1 tablet 1-2 times daily as needed for cramping and spasms     Historical Provider, MD  hydrocortisone (ANUSOL-HC) 2.5 % rectal cream Place rectally 2 (two) times daily as needed for hemorrhoids. Apply a small amount to anal area as needed for hemorriods 05/01/12   Gatha Mayer, MD  Insulin Glargine (LANTUS SOLOSTAR) 100 UNIT/ML SOPN Inject 24 Units into the skin daily. Once daily each morning    Historical Provider, MD  Insulin Pen Needle 32G X 4 MM MISC by Does not apply route. As directed,BD pen needle    Historical Provider, MD  metroNIDAZOLE (FLAGYL) 250 MG tablet Take 1 tablet (250 mg total) by mouth 3 (three) times daily. 05/20/13   Gatha Mayer, MD  Multiple Vitamin (MULTIVITAMIN) tablet Take 1 tablet by mouth daily.      Historical Provider, MD  pantoprazole (PROTONIX) 40 MG tablet TAKE 1 TABLET EVERY DAY 07/28/12   Gatha Mayer, MD  trandolapril (MAVIK) 4 MG tablet Take 4 mg by mouth daily.      Historical Provider, MD   BP 189/68  Pulse 71  Temp(Src) 97.8 F (36.6 C) (Oral)  Resp 22  SpO2 100% Physical Exam  Nursing note and vitals reviewed. Constitutional: She is oriented to person, place, and time. She appears well-developed and well-nourished.  HENT:  Head: Normocephalic and atraumatic.   Dry mucous membranes  Eyes: Conjunctivae and EOM are normal. Pupils are equal, round, and reactive to light.  Neck: Normal range of motion. Neck supple.  Cardiovascular: Normal rate and regular rhythm.  Exam reveals no gallop and no friction rub.   No murmur heard. Pulmonary/Chest: Effort normal and breath sounds normal. No respiratory distress. She has no wheezes. She has no rales. She exhibits no tenderness.  Abdominal: Soft. Bowel sounds are normal. She exhibits no distension and no mass. There is tenderness. There is no rebound and  no guarding.  Moderate tenderness to palpation over the left lower quadrant, mild tenderness to palpation over the right lower abdomen, and upper abdomen, no fluid wave  Musculoskeletal: Normal range of motion. She exhibits no edema and no tenderness.  Neurological: She is alert and oriented to person, place, and time.  Skin: Skin is warm and dry.  Psychiatric: She has a normal mood and affect. Her behavior is normal. Judgment and thought content normal.    ED Course  Procedures (including critical care time) Results for orders placed during the hospital encounter of 05/21/13  CBC WITH DIFFERENTIAL      Result Value Ref Range   WBC 10.1  4.0 - 10.5 K/uL   RBC 4.67  3.87 - 5.11 MIL/uL   Hemoglobin 11.3 (*) 12.0 - 15.0 g/dL   HCT 35.7 (*) 36.0 - 46.0 %   MCV 76.4 (*) 78.0 - 100.0 fL   MCH 24.2 (*) 26.0 - 34.0 pg   MCHC 31.7  30.0 - 36.0 g/dL   RDW 15.2  11.5 - 15.5 %   Platelets 211  150 - 400 K/uL   Neutrophils Relative % 68  43 - 77 %   Neutro Abs 6.9  1.7 - 7.7 K/uL   Lymphocytes Relative 26  12 - 46 %   Lymphs Abs 2.6  0.7 - 4.0 K/uL   Monocytes Relative 5  3 - 12 %   Monocytes Absolute 0.6  0.1 - 1.0 K/uL   Eosinophils Relative 1  0 - 5 %   Eosinophils Absolute 0.1  0.0 - 0.7 K/uL   Basophils Relative 0  0 - 1 %   Basophils Absolute 0.0  0.0 - 0.1 K/uL  COMPREHENSIVE METABOLIC PANEL      Result Value Ref Range   Sodium 137  137 - 147 mEq/L    Potassium 4.2  3.7 - 5.3 mEq/L   Chloride 99  96 - 112 mEq/L   CO2 24  19 - 32 mEq/L   Glucose, Bld 132 (*) 70 - 99 mg/dL   BUN 11  6 - 23 mg/dL   Creatinine, Ser 0.75  0.50 - 1.10 mg/dL   Calcium 9.2  8.4 - 10.5 mg/dL   Total Protein 7.0  6.0 - 8.3 g/dL   Albumin 3.7  3.5 - 5.2 g/dL   AST 16  0 - 37 U/L   ALT 13  0 - 35 U/L   Alkaline Phosphatase 74  39 - 117 U/L   Total Bilirubin 0.3  0.3 - 1.2 mg/dL   GFR calc non Af Amer 79 (*) >90 mL/min   GFR calc Af Amer >90  >90 mL/min  LIPASE, BLOOD      Result Value Ref Range   Lipase 12  11 - 59 U/L  URINALYSIS, ROUTINE W REFLEX MICROSCOPIC      Result Value Ref Range   Color, Urine YELLOW  YELLOW   APPearance CLEAR  CLEAR   Specific Gravity, Urine 1.014  1.005 - 1.030   pH 7.0  5.0 - 8.0   Glucose, UA NEGATIVE  NEGATIVE mg/dL   Hgb urine dipstick NEGATIVE  NEGATIVE   Bilirubin Urine NEGATIVE  NEGATIVE   Ketones, ur NEGATIVE  NEGATIVE mg/dL   Protein, ur NEGATIVE  NEGATIVE mg/dL   Urobilinogen, UA 0.2  0.0 - 1.0 mg/dL   Nitrite NEGATIVE  NEGATIVE   Leukocytes, UA NEGATIVE  NEGATIVE   Ct Abdomen Pelvis W Contrast  05/21/2013   CLINICAL  DATA:  Left lower quadrant pain, abdominal pain.  EXAM: CT ABDOMEN AND PELVIS WITH CONTRAST  TECHNIQUE: Multidetector CT imaging of the abdomen and pelvis was performed using the standard protocol following bolus administration of intravenous contrast.  CONTRAST:  73mL OMNIPAQUE IOHEXOL 300 MG/ML SOLN, 126mL OMNIPAQUE IOHEXOL 300 MG/ML SOLN  COMPARISON:  11/08/2011  FINDINGS: Heart is borderline in size. Lung bases are clear. No pleural effusions.  Mild fatty infiltration of the liver, decreased since prior study. No focal hepatic abnormality. Gallbladder, spleen, pancreas, adrenals and kidneys are unremarkable.  Scattered colonic diverticulosis. There is circumferential wall thickening within approximately a 5 cm segment of sigmoid colon. Mild surrounding inflammatory stranding noted as well. I favor  this represents an area of active diverticulitis, but follow-up is recommended with repeat CT or colonoscopy after treatment to completely exclude malignancy.  Small bowel is decompressed. Stomach is unremarkable. No free fluid, free air or adenopathy. Prior hysterectomy. No adnexal masses. Urinary bladder is unremarkable.  Stable lesion within the L3 vertebral body compatible with hemangioma. Stable round sclerotic area within the right sacrum, likely bone island. No acute bony abnormality.  IMPRESSION: Scattered diverticulosis.  Area of circumferential wall thickening within the sigmoid colon. Mild surrounding inflammatory change. Findings most likely reflect a active diverticulitis, but recommend followup CT or colonoscopy after treatment to exclude malignancy.  Improving fatty liver.   Electronically Signed   By: Rolm Baptise M.D.   On: 05/21/2013 11:55      EKG Interpretation None      MDM   Final diagnoses:  Diverticulitis    Patient with left lower quadrant abdominal pain. Concern for diverticulitis. Will treat pain, check labs, and CT scan.  CT is remarkable for diverticulitis. Patient seen by and discussed with Dr. Tamera Punt, who states the patient can be discharged to home with close followup. Patient states that she wants to go home. Her pain is improved with treatment in the emergency department. Will discharge to home with tramadol and Cipro. She has prescription of Flagyl, which she has not started yet. Followup with PCP in 2-3 days. Return precautions were given. Patient is stable and ready for discharge.  Filed Vitals:   05/21/13 1205  BP: 157/66  Pulse: 81  Temp: 98.6 F (37 C)  Resp: 12    1:38 PM Patient now states that she "remembers she is allergic to cipro."  Will switch to augmentin.  Patient states that she has had amoxicillin in the past and has done fine with this.  Discussed the plan with Dr. Tamera Punt, who agrees with the plan.   Montine Circle, PA-C 05/21/13  [place], PA-C 05/21/13 1340

## 2013-05-21 NOTE — ED Notes (Addendum)
Pt c/o lower abd pain since Friday, went to see DR yesterday, got antibiotic but did not take it. States pain got worse today. Last BM today, states he had bleeding from rectum. Pt states she feels dehydrated

## 2013-05-21 NOTE — ED Provider Notes (Signed)
Medical screening examination/treatment/procedure(s) were conducted as a shared visit with non-physician practitioner(s) and myself.  I personally evaluated the patient during the encounter.   EKG Interpretation None      PT with abd pain, hx of diverulosis, has diverticulitis on CT.  Pt wants to go home, will start abx, pt to f/u with her GI doctor.   Malvin Johns, MD 05/21/13 [phone]

## 2013-05-21 NOTE — Discharge Instructions (Signed)
Diverticulitis °A diverticulum is a small pouch or sac on the colon. Diverticulosis is the presence of these diverticula on the colon. Diverticulitis is the irritation (inflammation) or infection of diverticula. °CAUSES  °The colon and its diverticula contain bacteria. If food particles block the tiny opening to a diverticulum, the bacteria inside can grow and cause an increase in pressure. This leads to infection and inflammation and is called diverticulitis. °SYMPTOMS  °· Abdominal pain and tenderness. Usually, the pain is located on the left side of your abdomen. However, it could be located elsewhere. °· Fever. °· Bloating. °· Feeling sick to your stomach (nausea). °· Throwing up (vomiting). °· Abnormal stools. °DIAGNOSIS  °Your caregiver will take a history and perform a physical exam. Since many things can cause abdominal pain, other tests may be necessary. Tests may include: °· Blood tests. °· Urine tests. °· X-ray of the abdomen. °· CT scan of the abdomen. °Sometimes, surgery is needed to determine if diverticulitis or other conditions are causing your symptoms. °TREATMENT  °Most of the time, you can be treated without surgery. Treatment includes: °· Resting the bowels by only having liquids for a few days. As you improve, you will need to eat a low-fiber diet. °· Intravenous (IV) fluids if you are losing body fluids (dehydrated). °· Antibiotic medicines that treat infections may be given. °· Pain and nausea medicine, if needed. °· Surgery if the inflamed diverticulum has burst. °HOME CARE INSTRUCTIONS  °· Try a clear liquid diet (broth, tea, or water for as long as directed by your caregiver). You may then gradually begin a low-fiber diet as tolerated.  °A low-fiber diet is a diet with less than 10 grams of fiber. Choose the foods below to reduce fiber in the diet: °· White breads, cereals, rice, and pasta. °· Cooked fruits and vegetables or soft fresh fruits and vegetables without the skin. °· Ground or  well-cooked tender beef, ham, veal, lamb, pork, or poultry. °· Eggs and seafood. °· After your diverticulitis symptoms have improved, your caregiver may put you on a high-fiber diet. A high-fiber diet includes 14 grams of fiber for every 1000 calories consumed. For a standard 2000 calorie diet, you would need 28 grams of fiber. Follow these diet guidelines to help you increase the fiber in your diet. It is important to slowly increase the amount fiber in your diet to avoid gas, constipation, and bloating. °· Choose whole-grain breads, cereals, pasta, and brown rice. °· Choose fresh fruits and vegetables with the skin on. Do not overcook vegetables because the more vegetables are cooked, the more fiber is lost. °· Choose more nuts, seeds, legumes, dried peas, beans, and lentils. °· Look for food products that have greater than 3 grams of fiber per serving on the Nutrition Facts label. °· Take all medicine as directed by your caregiver. °· If your caregiver has given you a follow-up appointment, it is very important that you go. Not going could result in lasting (chronic) or permanent injury, pain, and disability. If there is any problem keeping the appointment, call to reschedule. °SEEK MEDICAL CARE IF:  °· Your pain does not improve. °· You have a hard time advancing your diet beyond clear liquids. °· Your bowel movements do not return to normal. °SEEK IMMEDIATE MEDICAL CARE IF:  °· Your pain becomes worse. °· You have an oral temperature above 102° F (38.9° C), not controlled by medicine. °· You have repeated vomiting. °· You have bloody or black, tarry stools. °·   Symptoms that brought you to your caregiver become worse or are not getting better. °MAKE SURE YOU:  °· Understand these instructions. °· Will watch your condition. °· Will get help right away if you are not doing well or get worse. °Document Released: 10/27/2004 Document Revised: 04/11/2011 Document Reviewed: 02/22/2010 °[employer]® Patient Information  ©2014 [employer]. ° °

## 2013-05-21 NOTE — Telephone Encounter (Signed)
Reviewed - we started metronidazole yesterday I did not use Augmentin because she has amoxicillin on allergy list  I think diverticulitis is possible but not proven Let's see how she does with Abx  She can call me back as planned already

## 2013-05-21 NOTE — Progress Notes (Signed)
Cipro intolerance has been added to allergy list. RN to notify MD for alternative prescription.  Romeo Rabon, PharmD, pager [phone]. 05/21/2013,1:32 PM.

## 2013-05-21 NOTE — ED Notes (Signed)
Patient transported to CT 

## 2013-05-22 NOTE — Telephone Encounter (Signed)
Patient notified of Dr. Gessner's recommendations 

## 2013-05-27 ENCOUNTER — Other Ambulatory Visit: Payer: Self-pay | Admitting: Internal Medicine

## 2013-06-03 ENCOUNTER — Telehealth: Payer: Self-pay | Admitting: Internal Medicine

## 2013-06-03 NOTE — Telephone Encounter (Signed)
Patient reports that she is still having LLQ pain and is not having some rectal bleeding on the toilet paper.  She would like to be seen next week.  I have scheduled her an appt for 06/10/13 9:45

## 2013-06-10 ENCOUNTER — Encounter: Payer: Self-pay | Admitting: Internal Medicine

## 2013-06-10 ENCOUNTER — Ambulatory Visit (INDEPENDENT_AMBULATORY_CARE_PROVIDER_SITE_OTHER): Payer: Medicare Other | Admitting: Internal Medicine

## 2013-06-10 VITALS — BP 164/80 | HR 60 | Ht 62.25 in | Wt 170.0 lb

## 2013-06-10 DIAGNOSIS — R1032 Left lower quadrant pain: Secondary | ICD-10-CM

## 2013-06-10 DIAGNOSIS — K648 Other hemorrhoids: Secondary | ICD-10-CM

## 2013-06-10 DIAGNOSIS — K5732 Diverticulitis of large intestine without perforation or abscess without bleeding: Secondary | ICD-10-CM

## 2013-06-10 DIAGNOSIS — Z85038 Personal history of other malignant neoplasm of large intestine: Secondary | ICD-10-CM

## 2013-06-10 DIAGNOSIS — G8929 Other chronic pain: Secondary | ICD-10-CM

## 2013-06-10 DIAGNOSIS — Z86010 Personal history of colonic polyps: Secondary | ICD-10-CM

## 2013-06-10 MED ORDER — MOVIPREP 100 G PO SOLR: ORAL | Status: DC

## 2013-06-10 NOTE — Progress Notes (Signed)
         Subjective:    Patient ID: [name], female    DOB: 1932-10-06, 80 y.o.   MRN: [id number]  HPI The patient is here for followup, I had seen her in April and prescribed to metronidazole for left lower quadrant pain and presumed diverticulitis though she has chronic symptoms so it is unclear, she had an acute severe spell that might her the following day, she went to the emergency department and a CT scan was performed and demonstrated changes consistent with sigmoid diverticulitis. Though she had an intolerance to amoxicillin listed she was able to take Augmentin for 10 days and had resolution of the acute bad pain. She continues with some chronic left lower quadrant pain. There is rare rectal bleeding from known hemorrhoids. However she think she is much improved overall. Her daily life is still stressful caring for her husband who has memory disturbance.  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 Medications, allergies, past medical history, past surgical history, family history and social history are reviewed and updated in the EMR.   Review of Systems As per history of present illness    Objective:   Physical Exam NAD Abd is soft, benign but diffusely tender BS+  She brings a metabolic panel from Dr. Moreen Fowler, it's a basic panel and it is normal except for glucose 123, collected may 5 I reviewed the CT scan including images, I showed dose to her, I review the labs in the ED notes as well.    Assessment & Plan:   1. Diverticulitis large intestine   2. Hemorrhoids, internal, with bleeding   3. Chronic LLQ pain   4. Personal history of colonic polyps   5. Personal history of malignant neoplasm of large intestine    1. Diverticulitis appears resolved she has a chronic left  lower quadrant pain and abdominal soreness to touch as always. 2. We discussed routine repeat surveillance colonoscopy in a decided to do one more.The risks and benefits as well as alternatives of endoscopic procedure(s) have been and reviewed. The patient agrees to proceed. 3. Low-grade intermittent hemorrhoidal bleeding continues rarely, she does not prefer any intervention for that at this time which is fine 4. She continues with a mild chronic anemia and takes iron in the form of a multivitamin, I suspect this is anemia of iron deficiency. Ferritin has been in the teens or low 20s for the last 5 years She's had multiple endoscopic examinations over the years but did not reveal an obvious cause from the GI tract. It is possible there is some sort of occult blood loss in the small intestine but I would not pursue that.  I appreciate the opportunity to care for this patient. CC: Gara Kroner, MD

## 2013-06-10 NOTE — Patient Instructions (Signed)
You have been scheduled for a colonoscopy with propofol. Please follow written instructions given to you at your visit today.  Please pick up your prep kit at the pharmacy within the next 1-3 days. If you use inhalers (even only as needed), please bring them with you on the day of your procedure. Your physician has requested that you go to www.startemmi.com and enter the access code given to you at your visit today. This web site gives a general overview about your procedure. However, you should still follow specific instructions given to you by our office regarding your preparation for the procedure.  I appreciate the opportunity to care for you.

## 2013-06-12 ENCOUNTER — Encounter: Payer: Self-pay | Admitting: Internal Medicine

## 2013-06-27 ENCOUNTER — Encounter: Payer: Self-pay | Admitting: Family Medicine

## 2013-07-30 ENCOUNTER — Encounter: Payer: Medicare Other | Admitting: Internal Medicine

## 2013-09-16 ENCOUNTER — Telehealth: Payer: Self-pay | Admitting: Internal Medicine

## 2013-09-16 NOTE — Telephone Encounter (Signed)
Questions about prepping answered since we went over this so long ago she wanted to refresh.

## 2013-09-19 ENCOUNTER — Ambulatory Visit (AMBULATORY_SURGERY_CENTER): Payer: Medicare Other | Admitting: Internal Medicine

## 2013-09-19 ENCOUNTER — Encounter: Payer: Self-pay | Admitting: Internal Medicine

## 2013-09-19 VITALS — BP 171/58 | HR 73 | Temp 98.9°F | Resp 17 | Ht 62.25 in | Wt 170.0 lb

## 2013-09-19 DIAGNOSIS — Z860100 Personal history of colon polyps, unspecified: Secondary | ICD-10-CM

## 2013-09-19 DIAGNOSIS — Z85038 Personal history of other malignant neoplasm of large intestine: Secondary | ICD-10-CM

## 2013-09-19 LAB — GLUCOSE, CAPILLARY
GLUCOSE-CAPILLARY: 99 mg/dL (ref 70–99)
Glucose-Capillary: 103 mg/dL — ABNORMAL HIGH (ref 70–99)

## 2013-09-19 MED ORDER — SODIUM CHLORIDE 0.9 % IV SOLN: 500.0000 mL | INTRAVENOUS | Status: DC

## 2013-09-19 NOTE — Op Note (Addendum)
[facility]  [address] [place], [address]   COLONOSCOPY PROCEDURE REPORT  PATIENT: [name], [name]  MR#: [id number] BIRTHDATE: 13-Aug-1932 , 32  yrs. old GENDER: Female ENDOSCOPIST: Gatha Mayer, MD, [facility] PROCEDURE DATE:  09/19/2013 PROCEDURE:   Colonoscopy, surveillance First Screening Colonoscopy - Avg.  risk and is 50 yrs.  old or older - No.  Prior Negative Screening - Now for repeat screening. N/A  History of Adenoma - Now for follow-up colonoscopy & has been > or = to 3 yrs.  Yes hx of adenoma.  Has been 3 or more years since last colonoscopy.  Polyps Removed Today? No.  Recommend repeat exam, <10 yrs? No. ASA CLASS:   Class III INDICATIONS:Patient's personal history of adenomatous colon polyps and High risk patient with personal history of colon cancer. MEDICATIONS: propofol (Diprivan) 200mg  IV, MAC sedation, administered by CRNA, and These medications were titrated to patient response per physician's verbal order  DESCRIPTION OF PROCEDURE:   After the risks benefits and alternatives of the procedure were thoroughly explained, informed consent was obtained.  A digital rectal exam revealed no abnormalities of the rectum.   The [id number]  endoscope was introduced through the anus and advanced to the cecum, which was identified by both the appendix and ileocecal valve. No adverse events experienced.   The quality of the prep was good, using MoviPrep  The instrument was then slowly withdrawn as the colon was fully examined.      COLON FINDINGS: Severe diverticulosis was noted in the sigmoid colon.   The colon mucosa was otherwise normal.  Retroflexed views revealed internal hemorrhoids. The time to cecum=2 minutes 56 seconds.  Withdrawal time=6 minutes 09 seconds.  The scope was withdrawn and the procedure completed. COMPLICATIONS: There were no complications.  ENDOSCOPIC IMPRESSION: 1.   Severe diverticulosis was noted in the sigmoid  colon 2.   The colon mucosa was otherwise normal - internal hemorrhoids in rectum  RECOMMENDATIONS: Follow-up as needed - no more routine colonoscopy   eSigned:  Gatha Mayer, MD, [facility] 09/19/2013 3:18 PM Revised: 09/19/2013 3:18 PM  cc: The Patient    and Antony Contras, MD

## 2013-09-19 NOTE — Patient Instructions (Addendum)
No polyps today!  You do not need routine colonoscopy n the future.  Please see me as needed.  I appreciate the opportunity to care for you. Gatha Mayer, MD, FACG  YOU HAD AN ENDOSCOPIC PROCEDURE TODAY AT [facility]: Refer to the procedure report that was given to you for any specific questions about what was found during the examination.  If the procedure report does not answer your questions, please call your gastroenterologist to clarify.  If you requested that your care partner not be given the details of your procedure findings, then the procedure report has been included in a sealed envelope for you to review at your convenience later.  YOU SHOULD EXPECT: Some feelings of bloating in the abdomen. Passage of more gas than usual.  Walking can help get rid of the air that was put into your GI tract during the procedure and reduce the bloating. If you had a lower endoscopy (such as a colonoscopy or flexible sigmoidoscopy) you may notice spotting of blood in your stool or on the toilet paper. If you underwent a bowel prep for your procedure, then you may not have a normal bowel movement for a few days.  DIET: Your first meal following the procedure should be a light meal and then it is ok to progress to your normal diet.  A half-sandwich or bowl of soup is an example of a good first meal.  Heavy or fried foods are harder to digest and may make you feel nauseous or bloated.  Likewise meals heavy in dairy and vegetables can cause extra gas to form and this can also increase the bloating.  Drink plenty of fluids but you should avoid alcoholic beverages for 24 hours.  ACTIVITY: Your care partner should take you home directly after the procedure.  You should plan to take it easy, moving slowly for the rest of the day.  You can resume normal activity the day after the procedure however you should NOT DRIVE or use heavy machinery for 24 hours (because of the sedation medicines used  during the test).    SYMPTOMS TO REPORT IMMEDIATELY: A gastroenterologist can be reached at any hour.  During normal business hours, 8:30 AM to 5:00 PM Monday through Friday, call [phone].  After hours and on weekends, please call the GI answering service at [phone] who will take a message and have the physician on call contact you.   Following lower endoscopy (colonoscopy or flexible sigmoidoscopy):  Excessive amounts of blood in the stool  Significant tenderness or worsening of abdominal pains  Swelling of the abdomen that is new, acute  Fever of 100F or higher   FOLLOW UP: If any biopsies were taken you will be contacted by phone or by letter within the next 1-3 weeks.  Call your gastroenterologist if you have not heard about the biopsies in 3 weeks.  Our staff will call the home number listed on your records the next business day following your procedure to check on you and address any questions or concerns that you may have at that time regarding the information given to you following your procedure. This is a courtesy call and so if there is no answer at the home number and we have not heard from you through the emergency physician on call, we will assume that you have returned to your regular daily activities without incident.  SIGNATURES/CONFIDENTIALITY: You and/or your care partner have signed paperwork which will be entered into  your electronic medical record.  These signatures attest to the fact that that the information above on your After Visit Summary has been reviewed and is understood.  Full responsibility of the confidentiality of this discharge information lies with you and/or your care-partner.

## 2013-09-19 NOTE — Progress Notes (Signed)
Report to PACU, RN, vss, BBS= Clear.  

## 2013-09-20 ENCOUNTER — Telehealth: Payer: Self-pay | Admitting: *Deleted

## 2013-09-20 NOTE — Telephone Encounter (Signed)
  Follow up Call-  Call back number 09/19/2013 06/28/2011  Post procedure Call Back phone  # [phone] ( no voicemail) [phone] or [phone]  Permission to leave phone message No Yes     Patient questions:  Do you have a fever, pain , or abdominal swelling? No. Pain Score  0 *  Have you tolerated food without any problems? Yes.    Have you been able to return to your normal activities? Yes.    Do you have any questions about your discharge instructions: Diet   No. Medications  No. Follow up visit  No.  Do you have questions or concerns about your Care? No.  Actions: * If pain score is 4 or above:0 No action needed, pain <4.

## 2013-10-08 ENCOUNTER — Other Ambulatory Visit (HOSPITAL_COMMUNITY): Payer: Self-pay | Admitting: Interventional Radiology

## 2013-10-08 DIAGNOSIS — I729 Aneurysm of unspecified site: Secondary | ICD-10-CM

## 2013-10-23 ENCOUNTER — Ambulatory Visit (HOSPITAL_COMMUNITY)
Admission: RE | Admit: 2013-10-23 | Discharge: 2013-10-23 | Disposition: A | Discharge status: Home / Self Care | Payer: Medicare Other | Source: Ambulatory Visit | Attending: Radiology | Admitting: Radiology

## 2013-10-23 ENCOUNTER — Ambulatory Visit (HOSPITAL_COMMUNITY): Payer: Medicare Other

## 2013-10-23 ENCOUNTER — Ambulatory Visit (HOSPITAL_COMMUNITY): Admission: RE | Admit: 2013-10-23 | Payer: Medicare Other | Source: Ambulatory Visit

## 2013-10-23 ENCOUNTER — Ambulatory Visit (HOSPITAL_COMMUNITY)
Admission: RE | Admit: 2013-10-23 | Discharge: 2013-10-23 | Disposition: A | Discharge status: Home / Self Care | Payer: Medicare Other | Source: Ambulatory Visit | Attending: Interventional Radiology | Admitting: Interventional Radiology

## 2013-10-23 DIAGNOSIS — I729 Aneurysm of unspecified site: Secondary | ICD-10-CM

## 2013-10-23 DIAGNOSIS — I728 Aneurysm of other specified arteries: Secondary | ICD-10-CM | POA: Diagnosis not present

## 2013-10-23 DIAGNOSIS — Z8673 Personal history of transient ischemic attack (TIA), and cerebral infarction without residual deficits: Secondary | ICD-10-CM | POA: Diagnosis not present

## 2013-10-23 DIAGNOSIS — I671 Cerebral aneurysm, nonruptured: Secondary | ICD-10-CM | POA: Diagnosis not present

## 2013-11-20 ENCOUNTER — Ambulatory Visit: Payer: Medicare Other | Admitting: Neurology

## 2013-12-12 ENCOUNTER — Encounter (HOSPITAL_COMMUNITY): Payer: Self-pay

## 2013-12-12 ENCOUNTER — Emergency Department (HOSPITAL_COMMUNITY): Payer: Medicare Other

## 2013-12-12 ENCOUNTER — Emergency Department (HOSPITAL_COMMUNITY)
Admission: EM | Admit: 2013-12-12 | Discharge: 2013-12-12 | Disposition: A | Discharge status: Home / Self Care | Payer: Medicare Other | Attending: Emergency Medicine | Admitting: Emergency Medicine

## 2013-12-12 DIAGNOSIS — Z9889 Other specified postprocedural states: Secondary | ICD-10-CM | POA: Insufficient documentation

## 2013-12-12 DIAGNOSIS — Z87440 Personal history of urinary (tract) infections: Secondary | ICD-10-CM | POA: Insufficient documentation

## 2013-12-12 DIAGNOSIS — Z9861 Coronary angioplasty status: Secondary | ICD-10-CM | POA: Diagnosis not present

## 2013-12-12 DIAGNOSIS — R011 Cardiac murmur, unspecified: Secondary | ICD-10-CM | POA: Diagnosis not present

## 2013-12-12 DIAGNOSIS — Z794 Long term (current) use of insulin: Secondary | ICD-10-CM | POA: Insufficient documentation

## 2013-12-12 DIAGNOSIS — Z86010 Personal history of colonic polyps: Secondary | ICD-10-CM | POA: Insufficient documentation

## 2013-12-12 DIAGNOSIS — Z79899 Other long term (current) drug therapy: Secondary | ICD-10-CM | POA: Diagnosis not present

## 2013-12-12 DIAGNOSIS — R42 Dizziness and giddiness: Secondary | ICD-10-CM

## 2013-12-12 DIAGNOSIS — Z8619 Personal history of other infectious and parasitic diseases: Secondary | ICD-10-CM | POA: Insufficient documentation

## 2013-12-12 DIAGNOSIS — I10 Essential (primary) hypertension: Secondary | ICD-10-CM | POA: Insufficient documentation

## 2013-12-12 DIAGNOSIS — Z8739 Personal history of other diseases of the musculoskeletal system and connective tissue: Secondary | ICD-10-CM | POA: Diagnosis not present

## 2013-12-12 DIAGNOSIS — K219 Gastro-esophageal reflux disease without esophagitis: Secondary | ICD-10-CM | POA: Diagnosis not present

## 2013-12-12 DIAGNOSIS — Z862 Personal history of diseases of the blood and blood-forming organs and certain disorders involving the immune mechanism: Secondary | ICD-10-CM | POA: Insufficient documentation

## 2013-12-12 DIAGNOSIS — Z85038 Personal history of other malignant neoplasm of large intestine: Secondary | ICD-10-CM | POA: Insufficient documentation

## 2013-12-12 DIAGNOSIS — G8929 Other chronic pain: Secondary | ICD-10-CM | POA: Diagnosis not present

## 2013-12-12 DIAGNOSIS — E119 Type 2 diabetes mellitus without complications: Secondary | ICD-10-CM | POA: Diagnosis not present

## 2013-12-12 DIAGNOSIS — Z8709 Personal history of other diseases of the respiratory system: Secondary | ICD-10-CM | POA: Diagnosis not present

## 2013-12-12 DIAGNOSIS — Z8742 Personal history of other diseases of the female genital tract: Secondary | ICD-10-CM | POA: Diagnosis not present

## 2013-12-12 LAB — CBC WITH DIFFERENTIAL/PLATELET
BASOS ABS: 0 10*3/uL (ref 0.0–0.1)
Basophils Relative: 0 % (ref 0–1)
EOS ABS: 0.2 10*3/uL (ref 0.0–0.7)
Eosinophils Relative: 2 % (ref 0–5)
HCT: 37.8 % (ref 36.0–46.0)
Hemoglobin: 11.9 g/dL — ABNORMAL LOW (ref 12.0–15.0)
Lymphocytes Relative: 54 % — ABNORMAL HIGH (ref 12–46)
Lymphs Abs: 4.9 10*3/uL — ABNORMAL HIGH (ref 0.7–4.0)
MCH: 23.9 pg — AB (ref 26.0–34.0)
MCHC: 31.5 g/dL (ref 30.0–36.0)
MCV: 76.1 fL — ABNORMAL LOW (ref 78.0–100.0)
Monocytes Absolute: 0.4 10*3/uL (ref 0.1–1.0)
Monocytes Relative: 5 % (ref 3–12)
Neutro Abs: 3.6 10*3/uL (ref 1.7–7.7)
Neutrophils Relative %: 39 % — ABNORMAL LOW (ref 43–77)
PLATELETS: 216 10*3/uL (ref 150–400)
RBC: 4.97 MIL/uL (ref 3.87–5.11)
RDW: 15.7 % — AB (ref 11.5–15.5)
WBC: 9.1 10*3/uL (ref 4.0–10.5)

## 2013-12-12 LAB — URINALYSIS, ROUTINE W REFLEX MICROSCOPIC
Bilirubin Urine: NEGATIVE
Glucose, UA: NEGATIVE mg/dL
HGB URINE DIPSTICK: NEGATIVE
Ketones, ur: NEGATIVE mg/dL
LEUKOCYTES UA: NEGATIVE
Nitrite: NEGATIVE
PH: 6 (ref 5.0–8.0)
Protein, ur: NEGATIVE mg/dL
Specific Gravity, Urine: 1.014 (ref 1.005–1.030)
Urobilinogen, UA: 0.2 mg/dL (ref 0.0–1.0)

## 2013-12-12 LAB — COMPREHENSIVE METABOLIC PANEL
ALBUMIN: 3.6 g/dL (ref 3.5–5.2)
ALT: 10 U/L (ref 0–35)
AST: 15 U/L (ref 0–37)
Alkaline Phosphatase: 68 U/L (ref 39–117)
Anion gap: 12 (ref 5–15)
BUN: 20 mg/dL (ref 6–23)
CALCIUM: 9.6 mg/dL (ref 8.4–10.5)
CO2: 27 mEq/L (ref 19–32)
Chloride: 107 mEq/L (ref 96–112)
Creatinine, Ser: 0.87 mg/dL (ref 0.50–1.10)
GFR calc Af Amer: 72 mL/min — ABNORMAL LOW (ref >90)
GFR calc non Af Amer: 62 mL/min — ABNORMAL LOW (ref >90)
Glucose, Bld: 95 mg/dL (ref 70–99)
Potassium: 4.6 mEq/L (ref 3.7–5.3)
SODIUM: 146 meq/L (ref 137–147)
Total Bilirubin: <0.2 mg/dL — ABNORMAL LOW (ref 0.3–1.2)
Total Protein: 7.2 g/dL (ref 6.0–8.3)

## 2013-12-12 LAB — TROPONIN I: Troponin I: <0.3 ng/mL (ref <0.30)

## 2013-12-12 LAB — CBG MONITORING, ED: Glucose-Capillary: 107 mg/dL — ABNORMAL HIGH (ref 70–99)

## 2013-12-12 NOTE — ED Notes (Signed)
Pt reports being at home and feeling dizzy.Pt was unable to stand without feeling like she was going to fall. Pt has appt with primary MD tomorrow at [facility] but said she was unable to wait due to weakness and fatigue. Husband at bedside.

## 2013-12-12 NOTE — Discharge Instructions (Signed)
As discussed, it is important that you follow up as soon as possible with your physician for continued management of your condition.  Please be sure to discuss your medications  If you develop any new, or concerning changes in your condition, please return to the emergency department immediately.     Dizziness Dizziness is a common problem. It is a feeling of unsteadiness or light-headedness. You may feel like you are about to faint. Dizziness can lead to injury if you stumble or fall. A person of any age group can suffer from dizziness, but dizziness is more common in older adults. CAUSES  Dizziness can be caused by many different things, including:  Middle ear problems.  Standing for too long.  Infections.  An allergic reaction.  Aging.  An emotional response to something, such as the sight of blood.  Side effects of medicines.  Tiredness.  Problems with circulation or blood pressure.  Excessive use of alcohol or medicines, or illegal drug use.  Breathing too fast (hyperventilation).  An irregular heart rhythm (arrhythmia).  A low red blood cell count (anemia).  Pregnancy.  Vomiting, diarrhea, fever, or other illnesses that cause body fluid loss (dehydration).  Diseases or conditions such as Parkinson's disease, high blood pressure (hypertension), diabetes, and thyroid problems.  Exposure to extreme heat. DIAGNOSIS  Your health care provider will ask about your symptoms, perform a physical exam, and perform an electrocardiogram (ECG) to record the electrical activity of your heart. Your health care provider may also perform other heart or blood tests to determine the cause of your dizziness. These may include:  Transthoracic echocardiogram (TTE). During echocardiography, sound waves are used to evaluate how blood flows through your heart.  Transesophageal echocardiogram (TEE).  Cardiac monitoring. This allows your health care provider to monitor your heart rate  and rhythm in real time.  Holter monitor. This is a portable device that records your heartbeat and can help diagnose heart arrhythmias. It allows your health care provider to track your heart activity for several days if needed.  Stress tests by exercise or by giving medicine that makes the heart beat faster. TREATMENT  Treatment of dizziness depends on the cause of your symptoms and can vary greatly. HOME CARE INSTRUCTIONS   Drink enough fluids to keep your urine clear or pale yellow. This is especially important in very hot weather. In older adults, it is also important in cold weather.  Take your medicine exactly as directed if your dizziness is caused by medicines. When taking blood pressure medicines, it is especially important to get up slowly.  Rise slowly from chairs and steady yourself until you feel okay.  In the morning, first sit up on the side of the bed. When you feel okay, stand slowly while holding onto something until you know your balance is fine.  Move your legs often if you need to stand in one place for a long time. Tighten and relax your muscles in your legs while standing.  Have someone stay with you for 1-2 days if dizziness continues to be a problem. Do this until you feel you are well enough to stay alone. Have the person call your health care provider if he or she notices changes in you that are concerning.  Do not drive or use heavy machinery if you feel dizzy.  Do not drink alcohol. SEEK IMMEDIATE MEDICAL CARE IF:   Your dizziness or light-headedness gets worse.  You feel nauseous or vomit.  You have problems talking, walking,  or using your arms, hands, or legs.  You feel weak.  You are not thinking clearly or you have trouble forming sentences. It may take a friend or family member to notice this.  You have chest pain, abdominal pain, shortness of breath, or sweating.  Your vision changes.  You notice any bleeding.  You have side effects from  medicine that seems to be getting worse rather than better. MAKE SURE YOU:   Understand these instructions.  Will watch your condition.  Will get help right away if you are not doing well or get worse. Document Released: 07/13/2000 Document Revised: 01/22/2013 Document Reviewed: 08/06/2010 [facility] Patient Information 2015 [place], [place]. This information is not intended to replace advice given to you by your health care provider. Make sure you discuss any questions you have with your health care provider.

## 2013-12-12 NOTE — ED Notes (Signed)
Bed: WA09 Expected date:  Expected time:  Means of arrival:  Comments:  EMS Dizzyness

## 2013-12-12 NOTE — ED Provider Notes (Signed)
CSN: [id number]     Arrival date & time 12/12/13  1635 History   First MD Initiated Contact with Patient 12/12/13 1636     Chief Complaint  Patient presents with  . Dizziness     HPI  Patient presents after several episodes of near-syncope. Episodes of been going on for at least 5 days.  Symptoms are worse with upright positioning, and upon awakening in the morning. There is no associated chest pain, no complete syncope, no falling. Patient has had a headache earlier today, but none currently. She denies visual loss, asymmetric weakness, confusion, disorientation. Patient has multiple medical problems, and recently had urinary tract infection, with completion of antibiotics. Patient has primary care follow-up tomorrow, in less than 24 hours.   Past Medical History  Diagnosis Date  . Anxiety   . Migraine   . DM (diabetes mellitus)   . GERD (gastroesophageal reflux disease)   . HLD (hyperlipidemia)   . HTN (hypertension)   . History of cerebral aneurysm repair     s/p coiling  . Peripheral neuropathy   . IBS (irritable bowel syndrome)   . Diverticulosis   . Adenomatous colon polyp 1970     carcinoma in situ  . History of hemorrhoids     with bleeding  . Hiatal hernia 02/03/05  . History of shingles   . UTI (lower urinary tract infection)   . DDD (degenerative disc disease)   . Iron deficiency   . Acute torn meniscus of knee   . Toe fracture, right     second toe  . Fundic gland polyposis of stomach   . Duodenitis     peptic, with gastric heterotopia  . Endometriosis     s/p hysterectomy  . MVP (mitral valve prolapse)   . Allergic rhinitis   . Varicose vein   . Osteopenia   . Hypercholesteremia   . Benign essential tremor syndrome   . Heart murmur   . Amaurosis fugax 08/07/2012  . colon ca dx'd 1970    surg only  . Diverticulitis   . Abdominal pain, chronic, left lower quadrant    Past Surgical History  Procedure Laterality Date  . Right hand decompressive  fasciotomy  08/26/07    , dorsal and volar  . Carpal tunnel release  08/26/07  . Appendectomy    . Colon resection    . Abdominal hysterectomy    . Coronary angioplasty with stent placement    . Cerebral aneurysm repair    . Esophagogastroduodenoscopy  02/03/05    hiatal hernia, 6 benign gastric polyps  . Colonoscopy w/ biopsies and polypectomy  02/03/05    diverticulosis, 4 mm sessile polyps, internal and external hemorrhoids  . Colonoscopy  06/07/08    divertiulosis, internal hemorrhoids  . Tonsillectomy    . Flexible sigmoidoscopy    . Abdominal hysterectomy    . Cerebral aneurysm repair    . Cataract extraction    . Hand surgery Right   . Intraocular lens insertion     Family History  Problem Relation Age of Onset  . Ovarian cancer Mother   . Diabetes Brother   . Hypertension Brother   . Heart disease Father   . Kidney disease Father   . Heart disease Brother   . Diabetes Brother   . Heart disease Brother   . Microcephaly Brother   . Diabetes    . Colon cancer    . Diabetes Paternal Grandmother    History  Substance  Use Topics  . Smoking status: Never Smoker   . Smokeless tobacco: Never Used  . Alcohol Use: No   OB History    No data available     Review of Systems  Constitutional:       Per HPI, otherwise negative  HENT:       Per HPI, otherwise negative  Respiratory:       Per HPI, otherwise negative  Cardiovascular:       Per HPI, otherwise negative  Gastrointestinal: Negative for vomiting.  Endocrine:       Negative aside from HPI  Genitourinary:       Neg aside from HPI   Musculoskeletal:       Per HPI, otherwise negative  Skin: Negative.   Neurological: Negative for syncope.      Allergies  Actos; Avandia; Fosamax; Ultram; Glimepiride; Januvia; Lipitor; Metformin and related; Prandin; Tradjenta; Zoloft; Aspirin; Bentyl; Ciprofloxacin; and Morphine and related  Home Medications   Prior to Admission medications   Medication Sig Start Date End  Date Taking? Authorizing Provider  acetaminophen (TYLENOL) 500 MG tablet Take 500 mg by mouth every 6 (six) hours as needed for pain.    Historical Provider, MD  ALPRAZolam Duanne Moron) 0.5 MG tablet Take 0.5 mg by mouth at bedtime as needed for anxiety or sleep.    Historical Provider, MD  atenolol (TENORMIN) 50 MG tablet Take 50 mg by mouth daily.    Historical Provider, MD  Coenzyme Q10 100 MG capsule Take 100 mg by mouth daily.      Historical Provider, MD  furosemide (LASIX) 20 MG tablet Take 20 mg by mouth See admin instructions. Takes 1 tablet 3 times a week as needed for swelling.    Historical Provider, MD  glycopyrrolate (ROBINUL-FORTE) 2 MG tablet Take 1 tablet 1-2 times daily as needed for cramping and spasms     Historical Provider, MD  hydrocortisone (ANUSOL-HC) 2.5 % rectal cream Place rectally 2 (two) times daily as needed for hemorrhoids. Apply a small amount to anal area as needed for hemorriods 05/01/12   Gatha Mayer, MD  Insulin Glargine (LANTUS SOLOSTAR) 100 UNIT/ML SOPN Inject 24 Units into the skin daily. Once daily each morning    Historical Provider, MD  Insulin Pen Needle 32G X 4 MM MISC by Does not apply route. As directed,BD pen needle    Historical Provider, MD  Multiple Vitamin (MULTIVITAMIN) tablet Take 1 tablet by mouth daily.      Historical Provider, MD  pantoprazole (PROTONIX) 40 MG tablet TAKE 1 TABLET EVERY DAY 05/27/13   Gatha Mayer, MD  trandolapril (MAVIK) 4 MG tablet Take 4 mg by mouth daily.      Historical Provider, MD   BP 175/86 mmHg  Pulse 55  Temp(Src) 97.6 F (36.4 C) (Oral)  Resp 25  SpO2 100% Physical Exam  Constitutional: She is oriented to person, place, and time. She has a sickly appearance.  HENT:  Head: Normocephalic and atraumatic.  Eyes: Conjunctivae and EOM are normal.  Cardiovascular: Normal rate and regular rhythm.   Pulmonary/Chest: Effort normal and breath sounds normal. No stridor. No respiratory distress.  Abdominal: She  exhibits no distension.  Musculoskeletal: She exhibits no edema.  Neurological: She is alert and oriented to person, place, and time. No cranial nerve deficit.  Skin: Skin is warm and dry.  Psychiatric: She has a normal mood and affect.  Nursing note and vitals reviewed.   ED Course  Procedures (including  critical care time) Labs Review Labs Reviewed  CBG MONITORING, ED - Abnormal; Notable for the following:    Glucose-Capillary 107 (*)    All other components within normal limits  CBC WITH DIFFERENTIAL  COMPREHENSIVE METABOLIC PANEL  TROPONIN I  URINALYSIS, ROUTINE W REFLEX MICROSCOPIC  POCT CBG (FASTING - GLUCOSE)-MANUAL ENTRY    Imaging Review No results found.   EKG Interpretation   Date/Time:  Thursday December 12 2013 16:36:53 EST Ventricular Rate:  52 PR Interval:  186 QRS Duration: 92 QT Interval:  431 QTC Calculation: 401 R Axis:   58 Text Interpretation:  Sinus rhythm Minimal ST elevation, inferior leads  Sinus rhythm ST-t wave abnormality No significant change since last  tracing Abnormal ekg Confirmed by Carmin Muskrat  MD [phone]) on  12/12/2013 4:38:53 PM     8:57 PM On repeat exam the patient is awake and alert, in no distress. Patient will follow up with her physician in 12 hours.  MDM   Final diagnoses:  Dizziness    Patient presents with concern of orthostatic dizziness. Patient is awake and alert, hemodynamically stable. Patient's evaluation is reassuring, with low suspicion for infection, electrolyte abnormalities. Orthostasis may be secondary to medication effects versus carotid insufficiency, both of which the patient can discuss with her physician tomorrow when she has follow-up scheduled.    Carmin Muskrat, MD 12/12/13 [id number]

## 2014-01-08 ENCOUNTER — Encounter: Payer: Self-pay | Admitting: Neurology

## 2014-01-08 ENCOUNTER — Ambulatory Visit (INDEPENDENT_AMBULATORY_CARE_PROVIDER_SITE_OTHER): Payer: Medicare Other | Admitting: Neurology

## 2014-01-08 VITALS — BP 146/76 | HR 69 | Resp 16 | Ht 62.5 in | Wt 175.0 lb

## 2014-01-08 DIAGNOSIS — G609 Hereditary and idiopathic neuropathy, unspecified: Secondary | ICD-10-CM | POA: Insufficient documentation

## 2014-01-08 DIAGNOSIS — I671 Cerebral aneurysm, nonruptured: Secondary | ICD-10-CM

## 2014-01-08 DIAGNOSIS — R2689 Other abnormalities of gait and mobility: Secondary | ICD-10-CM

## 2014-01-08 NOTE — Patient Instructions (Signed)
Cerebral Aneurysm  An aneurysm is the bulging or ballooning out of part of the weakened wall of a vein or artery. An aneurysm in the vein or artery of the brain is called a brain aneurysm, or cerebral aneurysm.   Aneurysms are a risk to your health because they may leak or rupture. Once the aneurysm leaks or ruptures, bleeding occurs. If the bleeding occurs within the brain tissue, the condition is called an intracerebral hemorrhage. An intracerebral hemorrhage can result in a hemorrhagic stroke. If the bleeding occurs in the area between the brain and the thin tissues that cover the brain, the condition is called a subarachnoid hemorrhage. This increases the pressure on the brain and causes some areas of the brain to not get the necessary blood flow. The blood from the ruptured aneurysm collects and presses on the surrounding brain tissue. A subarachnoid hemorrhage can cause a stroke. A ruptured cerebral aneurysm is a medical emergency. This can cause permanent damage and loss of brain function.  CAUSES  A cerebral aneurysm is caused when a weakened part of the blood vessel expands. The blood vessel expands due to the constant pressure from the flow of blood through the weakened blood vessel. Usually the aneurysm expands slowly. As the weakened aneurysm expands, the walls of the aneurysm become weaker. Aneurysms may be associated with diseases that weaken and damage the walls of your blood vessels or blood vessels that develop abnormally. Some known causes for cerebral aneurysms are:  · Head trauma.  · Infection.  · Use of "recreational drugs" such as cocaine or amphetamines.  RISK FACTORS  People at risk for a cerebral aneurysm or hemorrhagic stroke usually have one or more risk factors, which include:  · Having high blood pressure (hypertension).  · Abusing alcohol.  · Having abnormal blood vessels present since birth.  · Having certain bleeding disorders, such as hemophilia, sickle cell disease, or liver  disease.  · Taking blood thinners (anticoagulants).  · Smoking.  SIGNS AND SYMPTOMS   The signs and symptoms of an unruptured cerebral aneurysm will partly depend on its size and rate of growth. A small, unchanging aneurysm generally does not produce symptoms. A larger aneurysm that is steadily growing can increase pressure on the brain or nerves. That increased pressure from the unruptured cerebral aneurysm can cause:  · A headache.  · Problems with your vision.  · Numbness or weakness in an arm or leg.  · Problems with memory.  · Problems speaking.  · Seizures.  If an aneurysm leaks or bursts, it can cause a stroke and be life-threatening. Symptoms may include:  · A sudden, severe headache with no known cause. The headache is often described as the worst headache ever experienced.  · Nausea or vomiting, especially when combined with other symptoms such as a headache.  · Sudden weakness or numbness of the face, arm, or leg, especially on one side of the body.  · Sudden trouble walking or difficulty moving arms or legs.  · Sudden confusion.  · Sudden personality changes.  · Trouble speaking (aphasia) or understanding.  · Difficulty swallowing.  · Sudden trouble seeing in one or both eyes.  · Double vision.  · Dizziness.  · Loss of balance or coordination.  · Intolerance to light.  · Stiff neck.  DIAGNOSIS   A CTA (computed tomographic angiography) may be performed to diagnose an aneurysm. A CTA uses dye and a CT scanner to take images of your blood vessels. An   MRA (magnetic resonance angiography) may be used to diagnose an aneurysm. An MRA is performed in an MRI machine. While in the MRI machine, images of your blood vessels are taken. A cerebral aneurysm may also be diagnosed with a cerebral angiogram. A cerebral angiogram requires a tube called a catheter to be inserted into a blood vessel and advanced to the blood vessels in your neck. Dye is then injected while X-ray images are taken to show the blood vessels in  your brain.  TREATMENT   Unruptured Aneurysms  Treatment is complex when an aneurysm is found and it is not causing problems. Treatment is very individualized, as each case is different. Many things must be considered, such as the size and exact location of your aneurysm, your age, your overall health, and your feelings and preferences. Small aneurysms in certain locations of the brain have a very low chance of bleeding or rupturing. These small aneurysms may not be treated. However, depending on the size and location of the aneurysm, treatments may be recommended and include:  · Coiling. During this procedure, a catheter is inserted and advanced through a blood vessel. Once the catheter reaches the aneurysm, tiny coils are used to block blood flow into the aneurysm.  · Surgical clipping. During surgery, a clip is placed at the base of the aneurysm. The clip prevents blood from continuing to enter the aneurysm.  Ruptured Aneurysms  Immediate emergency surgery may be needed to help prevent damage to the brain and to reduce the risk of rebleeding. Timing of treatment is an important factor in the prevention of complications. Successful early treatment of a ruptured aneurysm (within the first 3 days of a bleed) helps to prevent rebleeding and blood vessel spasm. In some cases, there may be a reason to treat later (10-14 days after a rupture). Many things are considered when making this decision, and each case is handled individually.  HOME CARE INSTRUCTIONS  · Take medicines only as instructed by your health care provider.  · Eat healthy foods. It is recommended that you eat 5 or more servings of fruits and vegetables each day. Foods may need to be a special consistency (soft or pureed), or small bites may need to be taken if you have had a ruptured aneurysm or stroke. Certain dietary changes may be advised to address high blood pressure, high cholesterol, diabetes, or obesity.  ¨ Food choices that are low in salt  (sodium), saturated fat, trans fat, and cholesterol are recommended to manage high blood pressure.  ¨ Food choices that are high in fiber and low in saturated fat, trans fat, and cholesterol are recommended to control cholesterol levels.  ¨ Controlling carbohydrate and sugar intake is recommended to manage diabetes.  ¨ Reducing calorie intake and making food choices that are low in sodium, saturated fat, trans fat, and cholesterol are recommended to manage obesity.  · Maintain a healthy weight.  · Stay physically active. It is recommended that you get at least 30 minutes of activity on most or all days.  · Do not smoke.  · Limit alcohol use. Moderate alcohol use is considered to be:  ¨ No more than 2 drinks each day for men.  ¨ No more than 1 drink each day for nonpregnant women.  · Stop drug abuse.  · A safe home environment is important to reduce the risk of falls. Your health care provider may arrange for specialists to evaluate your home. Having grab bars in the bedroom and   bathroom is often important. Your health care provider may arrange for special equipment to be used at home, such as raised toilets and a seat for the shower.  · Physical, occupational, and speech therapy. Ongoing therapy may be needed to maximize your recovery after a ruptured aneurysm or stroke. If you have been advised to use a walker or a cane, use it at all times. Be sure to keep your therapy appointments.  · Follow all instructions for follow-up with your health care provider. This is very important. This includes any referrals, physical therapy, rehabilitation, and laboratory tests. Proper follow-up may prevent an aneurysm rupture or a stroke.  SEEK IMMEDIATE MEDICAL CARE IF:  · You have a sudden, severe headache with no known cause.  · You have sudden nausea or vomiting with a severe headache.  · You have sudden weakness or numbness of the face, arm, or leg, especially on one side of the body.  · You have sudden trouble walking or  difficulty moving arms or legs.  · You have sudden confusion.  · You have trouble speaking or understanding.  · You have sudden trouble seeing in one or both eyes.  · You have a sudden loss of balance or coordination.  · You have a stiff neck.  · You have difficulty breathing.  · You have a partial or total loss of consciousness.  Any of these symptoms may represent a serious problem that is an emergency. Do not wait to see if the symptoms will go away. Get medical help at once. Call your local emergency services (911 in [place] Do not drive yourself to the hospital.  Document Released: 10/09/2001 Document Revised: 06/03/2013 Document Reviewed: 07/05/2012  [employer]® Patient Information ©2015 [employer]. This information is not intended to replace advice given to you by your health care provider. Make sure you discuss any questions you have with your health care provider.

## 2014-01-08 NOTE — Progress Notes (Signed)
[facility]  Provider:  Larey Seat, M D  Referring Provider: Gara Kroner, MD Primary Care Physician:  Gara Kroner, MD  Chief Complaint  Patient presents with  . RV memory    Rm 10, alone    HPI:  SHAWNICE TILMON is a 80 y.o. female  andseen here as a regular yearly revisit  , her PCP has recently seen her again and changed her HTN medications. ( Dr. Moreen Fowler)  .  Interval history : I had referred Mrs Haugen for an MRA brain and angiogram, which revealed no new lesions and no progression of the known left para-ophthalmic aneurysm. She is walking with her walker, the main caretaker of her demented husband Vonna Drafts and is complaining about ongoing dizziness, only mild improvement. She is worried about their future living independently. Her husband is not allowed to drive anymore. Her nephew committed suicide in late summer. Last visit for MRI follow up and possible TIA .negative results. MOCA 26-30 points.    CD; Last seen here as a referral from Dr. Bing Plume for a sudden loss of vision, blurring in September 2014 . She had been seen by her ophthalmologist , who knew  about her history of cerebral aneurysm.   Her last MRI showed no return and no increase in size of her known aneurysm Mrs Kleiber reports feeling better since the last visit, and has found more energy. She seems less depressed.  She is not on any antidepressant medication but has been taking XANAX for several years as a sleep aid. She felt that getting good rest was the reason for feeling better.  I discuss today the recent discussion of benzodiazepines suggesting a higher risk of developing Alzheimer's dementia. She is willing to try OTC  Such Tylenol PM or similar Unisom.  She reports some name finding difficulties and words sometimes not coming to her mid sentence.     Previously, I  followed the patient in  2008 last - for aneurysm follow up. The patient reported that she walk up being unable to see  anything, managed to go to the bathroom and to try to wash her eyes as she felt there was some film covering the eyes . She states her blindness/ darkness turned into blurring , she could see something at about 30 minutes- but shut her left eye - perhaps this was diplopia? But the blurred vision lasted for another 2-3 days.  During that time, she saw Dr. Rachael Fee office associate and was found to have no significant abnormalities.  Also told me that she lost 3 people she was closed to in the last 9 months, including her 12 year old nephew, her last remaining brother, and her best friend of 29 years.  Her husband suffers form dementia.   She has meanwhile become diabetic, neuropathic and retinopathic.   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     Review of Systems:  Out of a complete 14 system review, the patient complains of only the following symptoms, and all other reviewed systems are negative.  burning sensation, essential tremor with vocal tremor. Obesity. Staggering gait.  Fall risk assesment at 10 points,  Epworth 12 , FSS 38 points. MOCA at 26-30 points.     History   Social History  . Marital Status: Married    Spouse Name: N/A    Number of Children: 0  . Years of Education: N/A   Occupational History  . retired    Social History Main  Topics  . Smoking status: Never Smoker   . Smokeless tobacco: Never Used  . Alcohol Use: No  . Drug Use: No  . Sexual Activity: Not on file   Other Topics Concern  . Not on file   Social History Narrative    Family History  Problem Relation Age of Onset  . Ovarian cancer Mother   . Diabetes Brother   . Hypertension Brother   . Heart disease Father   . Kidney disease Father   . Heart disease Brother   . Diabetes Brother   . Heart disease Brother   . Microcephaly Brother   . Diabetes    . Colon cancer    . Diabetes Paternal Grandmother     Past Medical History  Diagnosis Date  . Anxiety   . Migraine   . DM  (diabetes mellitus)   . GERD (gastroesophageal reflux disease)   . HLD (hyperlipidemia)   . HTN (hypertension)   . History of cerebral aneurysm repair     s/p coiling  . Peripheral neuropathy   . IBS (irritable bowel syndrome)   . Diverticulosis   . Adenomatous colon polyp 1970     carcinoma in situ  . History of hemorrhoids     with bleeding  . Hiatal hernia 02/03/05  . History of shingles   . UTI (lower urinary tract infection)   . DDD (degenerative disc disease)   . Iron deficiency   . Acute torn meniscus of knee   . Toe fracture, right     second toe  . Fundic gland polyposis of stomach   . Duodenitis     peptic, with gastric heterotopia  . Endometriosis     s/p hysterectomy  . MVP (mitral valve prolapse)   . Allergic rhinitis   . Varicose vein   . Osteopenia   . Hypercholesteremia   . Benign essential tremor syndrome   . Heart murmur   . Amaurosis fugax 08/07/2012  . colon ca dx'd 1970    surg only  . Diverticulitis   . Abdominal pain, chronic, left lower quadrant     Past Surgical History  Procedure Laterality Date  . Right hand decompressive fasciotomy  08/26/07    , dorsal and volar  . Carpal tunnel release  08/26/07  . Appendectomy    . Colon resection    . Abdominal hysterectomy    . Coronary angioplasty with stent placement    . Cerebral aneurysm repair    . Esophagogastroduodenoscopy  02/03/05    hiatal hernia, 6 benign gastric polyps  . Colonoscopy w/ biopsies and polypectomy  02/03/05    diverticulosis, 4 mm sessile polyps, internal and external hemorrhoids  . Colonoscopy  06/07/08    divertiulosis, internal hemorrhoids  . Tonsillectomy    . Flexible sigmoidoscopy    . Abdominal hysterectomy    . Cerebral aneurysm repair    . Cataract extraction    . Hand surgery Right   . Intraocular lens insertion      Current Outpatient Prescriptions  Medication Sig Dispense Refill  . acetaminophen (TYLENOL) 500 MG tablet Take 500 mg by mouth every 6 (six) hours  as needed for pain.    Marland Kitchen ALPRAZolam (XANAX) 0.5 MG tablet Take 0.5 mg by mouth at bedtime as needed for anxiety or sleep (sleep).     Marland Kitchen atenolol (TENORMIN) 50 MG tablet Take 50 mg by mouth daily.    . Coenzyme Q10 100 MG capsule Take 100 mg by  mouth daily.      . furosemide (LASIX) 20 MG tablet Take 20 mg by mouth as needed. Take 1 tablet 3 times a week as needed for swelling.    Marland Kitchen glycopyrrolate (ROBINUL-FORTE) 2 MG tablet Take 2 mg by mouth daily as needed (cramping or spasms).     . hydrocortisone (ANUSOL-HC) 2.5 % rectal cream Place rectally 2 (two) times daily as needed for hemorrhoids. Apply a small amount to anal area as needed for hemorriods 30 g 0  . Insulin Glargine (LANTUS SOLOSTAR) 100 UNIT/ML SOPN Inject 24 Units into the skin daily. Once daily each morning    . Insulin Pen Needle 32G X 4 MM MISC by Does not apply route. As directed,BD pen needle    . Multiple Vitamin (MULTIVITAMIN) tablet Take 1 tablet by mouth daily.      . pantoprazole (PROTONIX) 40 MG tablet TAKE 1 TABLET EVERY DAY 30 tablet 11  . pantoprazole (PROTONIX) 40 MG tablet Take 40 mg by mouth daily.    . trandolapril (MAVIK) 4 MG tablet Take 4 mg by mouth daily.       No current facility-administered medications for this visit.    Allergies as of 01/08/2014 - Review Complete 01/08/2014  Allergen Reaction Noted  . Actos [pioglitazone]  08/07/2012  . Avandia [rosiglitazone]  08/07/2012  . Fosamax [alendronate sodium]  08/07/2012  . Ultram [tramadol] Shortness Of Breath 06/10/2013  . Glimepiride  08/07/2012  . Januvia [sitagliptin]  08/07/2012  . Lipitor [atorvastatin]  08/07/2012  . Metformin and related  08/07/2012  . Prandin [repaglinide]  08/07/2012  . Tradjenta [linagliptin]  08/07/2012  . Zoloft [sertraline hcl]  08/07/2012  . Aspirin Other (See Comments)   . Bentyl [dicyclomine hcl]  08/07/2012  . Ciprofloxacin Nausea And Vomiting 05/21/2013  . Keflex [cephalexin]  12/12/2013  . Morphine and related  Other (See Comments) 06/10/2013    Vitals: BP 146/76 mmHg  Pulse 69  Resp 16  Ht 5' 2.5" (1.588 m)  Wt 175 lb (79.379 kg)  BMI 31.48 kg/m2 Last Weight:  Wt Readings from Last 1 Encounters:  01/08/14 175 lb (79.379 kg)   Last Height:   Ht Readings from Last 1 Encounters:  01/08/14 5' 2.5" (1.588 m)    Very severe edema of the right lag and ankle. Painful neuropathy.  RR 14 , no wheezing, RRR of the heart.  :Neurologic exam :  The patient is awake and alert, oriented to place and time. Memory subjective described as intact.  There is a normal attention span & concentration ability.   MOCA 26 out of 30  Points- last  visit was 30-30 points.  Speech is fluent without Dysarthria, mild dysphonia , not aphasia.  Mood and affect are appropriate.  There is some oro-fascial dyskinesia, automatism with some lip smacking.   Cranial nerves:  Pupils are equal and briskly reactive to light. Funduscopic exam without evidence of Edema . Extraocular movements in vertical and horizontal planes intact and without nystagmus. Visual fields by finger perimetry are intact.  Hearing to finger rub intact. Facial sensation intact to fine touch. Facial motor strength is symmetric - tongue and uvula move midline.  Motor exam: Normal tone and normal muscle bulk and symmetric normal strength in all extremities.  Her grip is weaker on the right, since an infiltration by IV fluid.  Sensory: Fine touch, pinprick and vibration were tested in all extremities. Numbness in the right foot , loss of proprioception at and below ankle;  numbness bilat. Present but  right over left.  Coordination: Rapid alternating movements in the fingers/hands is normal. Finger-to-nose testing  without evidence of ataxia, dysmetria or tremor.  Gait and station: Patient walks with a walker , cannot safely turn, cannot safely bend , uses cane inside her  house , walker outside .  Gait strength reduced . Stance is stable and normal. Tandem  gait was deferred.  Deep tendon reflexes: in the upper and lower extremities are symmetric and intact. Babinski maneuver response is  equivocal.     Assessment:  After physical and neurologic examination, review of laboratory studies, imaging, neurophysiology testing and pre-existing records, 1) assessment is that of a stable intracerebral aneurysm size , no bleed. Unlikely that the  aneurysm has been a cause for the visual changes, after review of angiogram and MRA. Marland Kitchen  2) gait instability - continue walker. Marland Kitchen 3) memory mild impairment. MOCA 26-30.  4) insomnia, improved  Plan:  Treatment plan and additional workup : Rv with MOCA  In 12 month, Megan or me. Drema Balzarine or benadryl for insomnia as needed.  patient advised to [place]- suggested a  week at [facility] to try the senior facilities out.

## 2014-04-01 ENCOUNTER — Emergency Department (HOSPITAL_COMMUNITY): Payer: Medicare Other

## 2014-04-01 ENCOUNTER — Encounter (HOSPITAL_COMMUNITY): Payer: Self-pay | Admitting: Emergency Medicine

## 2014-04-01 ENCOUNTER — Observation Stay (HOSPITAL_COMMUNITY)
Admission: EM | Admit: 2014-04-01 | Discharge: 2014-04-03 | Disposition: A | Discharge status: Home / Self Care | Payer: Medicare Other | Attending: Internal Medicine | Admitting: Internal Medicine

## 2014-04-01 DIAGNOSIS — Z86718 Personal history of other venous thrombosis and embolism: Secondary | ICD-10-CM | POA: Insufficient documentation

## 2014-04-01 DIAGNOSIS — K219 Gastro-esophageal reflux disease without esophagitis: Secondary | ICD-10-CM | POA: Insufficient documentation

## 2014-04-01 DIAGNOSIS — Z79899 Other long term (current) drug therapy: Secondary | ICD-10-CM | POA: Diagnosis not present

## 2014-04-01 DIAGNOSIS — F419 Anxiety disorder, unspecified: Secondary | ICD-10-CM | POA: Insufficient documentation

## 2014-04-01 DIAGNOSIS — Z794 Long term (current) use of insulin: Secondary | ICD-10-CM | POA: Diagnosis not present

## 2014-04-01 DIAGNOSIS — R55 Syncope and collapse: Secondary | ICD-10-CM | POA: Diagnosis present

## 2014-04-01 DIAGNOSIS — E785 Hyperlipidemia, unspecified: Secondary | ICD-10-CM | POA: Insufficient documentation

## 2014-04-01 DIAGNOSIS — E119 Type 2 diabetes mellitus without complications: Secondary | ICD-10-CM

## 2014-04-01 DIAGNOSIS — K589 Irritable bowel syndrome without diarrhea: Secondary | ICD-10-CM | POA: Diagnosis present

## 2014-04-01 DIAGNOSIS — E138 Other specified diabetes mellitus with unspecified complications: Secondary | ICD-10-CM

## 2014-04-01 DIAGNOSIS — E860 Dehydration: Secondary | ICD-10-CM | POA: Diagnosis not present

## 2014-04-01 DIAGNOSIS — E1142 Type 2 diabetes mellitus with diabetic polyneuropathy: Secondary | ICD-10-CM | POA: Insufficient documentation

## 2014-04-01 DIAGNOSIS — I951 Orthostatic hypotension: Secondary | ICD-10-CM | POA: Diagnosis not present

## 2014-04-01 DIAGNOSIS — R0789 Other chest pain: Secondary | ICD-10-CM | POA: Insufficient documentation

## 2014-04-01 DIAGNOSIS — R0602 Shortness of breath: Secondary | ICD-10-CM | POA: Insufficient documentation

## 2014-04-01 DIAGNOSIS — I10 Essential (primary) hypertension: Secondary | ICD-10-CM | POA: Diagnosis not present

## 2014-04-01 DIAGNOSIS — K921 Melena: Secondary | ICD-10-CM | POA: Diagnosis not present

## 2014-04-01 DIAGNOSIS — E1169 Type 2 diabetes mellitus with other specified complication: Secondary | ICD-10-CM

## 2014-04-01 DIAGNOSIS — K649 Unspecified hemorrhoids: Secondary | ICD-10-CM | POA: Diagnosis not present

## 2014-04-01 DIAGNOSIS — R079 Chest pain, unspecified: Secondary | ICD-10-CM | POA: Insufficient documentation

## 2014-04-01 DIAGNOSIS — K5790 Diverticulosis of intestine, part unspecified, without perforation or abscess without bleeding: Secondary | ICD-10-CM | POA: Diagnosis present

## 2014-04-01 DIAGNOSIS — IMO0001 Reserved for inherently not codable concepts without codable children: Secondary | ICD-10-CM

## 2014-04-01 LAB — BASIC METABOLIC PANEL
ANION GAP: 6 (ref 5–15)
BUN: 16 mg/dL (ref 6–23)
CO2: 28 mmol/L (ref 19–32)
Calcium: 9 mg/dL (ref 8.4–10.5)
Chloride: 108 mmol/L (ref 96–112)
Creatinine, Ser: 0.73 mg/dL (ref 0.50–1.10)
GFR calc Af Amer: >90 mL/min (ref >90)
GFR, EST NON AFRICAN AMERICAN: 79 mL/min — AB (ref >90)
Glucose, Bld: 135 mg/dL — ABNORMAL HIGH (ref 70–99)
POTASSIUM: 4.1 mmol/L (ref 3.5–5.1)
Sodium: 142 mmol/L (ref 135–145)

## 2014-04-01 LAB — URINALYSIS, ROUTINE W REFLEX MICROSCOPIC
BILIRUBIN URINE: NEGATIVE
Glucose, UA: NEGATIVE mg/dL
HGB URINE DIPSTICK: NEGATIVE
Ketones, ur: NEGATIVE mg/dL
Leukocytes, UA: NEGATIVE
NITRITE: NEGATIVE
PH: 7 (ref 5.0–8.0)
Protein, ur: NEGATIVE mg/dL
Specific Gravity, Urine: 1.015 (ref 1.005–1.030)
Urobilinogen, UA: 0.2 mg/dL (ref 0.0–1.0)

## 2014-04-01 LAB — CBC
HCT: 38.2 % (ref 36.0–46.0)
Hemoglobin: 11.9 g/dL — ABNORMAL LOW (ref 12.0–15.0)
MCH: 24.7 pg — ABNORMAL LOW (ref 26.0–34.0)
MCHC: 31.2 g/dL (ref 30.0–36.0)
MCV: 79.4 fL (ref 78.0–100.0)
PLATELETS: 182 10*3/uL (ref 150–400)
RBC: 4.81 MIL/uL (ref 3.87–5.11)
RDW: 16.2 % — AB (ref 11.5–15.5)
WBC: 10 10*3/uL (ref 4.0–10.5)

## 2014-04-01 LAB — TROPONIN I
Troponin I: <0.03 ng/mL (ref <0.031)
Troponin I: <0.03 ng/mL (ref <0.031)

## 2014-04-01 LAB — CBG MONITORING, ED
Glucose-Capillary: 136 mg/dL — ABNORMAL HIGH (ref 70–99)
Glucose-Capillary: 117 mg/dL — ABNORMAL HIGH (ref 70–99)

## 2014-04-01 LAB — BRAIN NATRIURETIC PEPTIDE: B NATRIURETIC PEPTIDE 5: 108.8 pg/mL — AB (ref 0.0–100.0)

## 2014-04-01 LAB — PROTIME-INR
INR: 0.97 (ref 0.00–1.49)
Prothrombin Time: 12.9 seconds (ref 11.6–15.2)

## 2014-04-01 LAB — GLUCOSE, CAPILLARY: Glucose-Capillary: 149 mg/dL — ABNORMAL HIGH (ref 70–99)

## 2014-04-01 MED ORDER — FUROSEMIDE 20 MG PO TABS
20.0000 mg | ORAL_TABLET | Freq: Every day | ORAL | Status: DC
  Administered 2014-04-01: 20 mg via ORAL
  Filled 2014-04-01: qty 1

## 2014-04-01 MED ORDER — ATENOLOL 25 MG PO TABS
25.0000 mg | ORAL_TABLET | Freq: Every day | ORAL | Status: DC
  Administered 2014-04-02 – 2014-04-03 (×2): 25 mg via ORAL
  Filled 2014-04-01 (×2): qty 1

## 2014-04-01 MED ORDER — ALPRAZOLAM 0.5 MG PO TABS: 0.5000 mg | ORAL_TABLET | Freq: Every evening | ORAL | Status: DC | PRN

## 2014-04-01 MED ORDER — ENOXAPARIN SODIUM 40 MG/0.4ML ~~LOC~~ SOLN
40.0000 mg | SUBCUTANEOUS | Status: DC
  Administered 2014-04-01 – 2014-04-02 (×2): 40 mg via SUBCUTANEOUS
  Filled 2014-04-01 (×2): qty 0.4

## 2014-04-01 MED ORDER — HYDROCORTISONE 2.5 % RE CREA: TOPICAL_CREAM | Freq: Two times a day (BID) | RECTAL | Status: DC | PRN

## 2014-04-01 MED ORDER — SODIUM CHLORIDE 0.9 % IJ SOLN
3.0000 mL | Freq: Two times a day (BID) | INTRAMUSCULAR | Status: DC
  Administered 2014-04-01 – 2014-04-03 (×3): 3 mL via INTRAVENOUS

## 2014-04-01 MED ORDER — TRANDOLAPRIL 4 MG PO TABS
4.0000 mg | ORAL_TABLET | Freq: Every day | ORAL | Status: DC
  Administered 2014-04-02 – 2014-04-03 (×2): 4 mg via ORAL
  Filled 2014-04-01 (×3): qty 1

## 2014-04-01 MED ORDER — HYDRALAZINE HCL 20 MG/ML IJ SOLN
2.0000 mg | INTRAMUSCULAR | Status: DC | PRN
  Administered 2014-04-01 – 2014-04-03 (×4): 2 mg via INTRAVENOUS
  Filled 2014-04-01 (×4): qty 1

## 2014-04-01 MED ORDER — ACETAMINOPHEN 500 MG PO TABS
500.0000 mg | ORAL_TABLET | Freq: Four times a day (QID) | ORAL | Status: DC | PRN
  Administered 2014-04-01 – 2014-04-03 (×4): 500 mg via ORAL
  Filled 2014-04-01 (×5): qty 1

## 2014-04-01 MED ORDER — INSULIN ASPART 100 UNIT/ML ~~LOC~~ SOLN
0.0000 [IU] | Freq: Three times a day (TID) | SUBCUTANEOUS | Status: DC
  Administered 2014-04-02 – 2014-04-03 (×3): 1 [IU] via SUBCUTANEOUS

## 2014-04-01 MED ORDER — INSULIN GLARGINE 100 UNIT/ML ~~LOC~~ SOLN
18.0000 [IU] | Freq: Every day | SUBCUTANEOUS | Status: DC
  Filled 2014-04-01: qty 0.18

## 2014-04-01 MED ORDER — PANTOPRAZOLE SODIUM 40 MG PO TBEC
40.0000 mg | DELAYED_RELEASE_TABLET | Freq: Every day | ORAL | Status: DC
Start: 2014-04-02 — End: 2014-04-03
  Administered 2014-04-02 – 2014-04-03 (×2): 40 mg via ORAL
  Filled 2014-04-01 (×2): qty 1

## 2014-04-01 NOTE — ED Notes (Addendum)
PT WAS WALKING TO CAR AFTER GETTING HER HAIR DONE WHEN SHE GOT DIZZY AND HAD SYNCOPAL EPISODE.  PT STATES THAT SHE HAS HAD ON GOING ISSUES WITH DIZZINESS AND NO ONE CAN FIGURE OUT WHY.  PT WAS TOLD BY [facility] THAT SHE FELL BACKWARDS ONTO THE GROUND.  PT IS A DIABETIC.

## 2014-04-01 NOTE — H&P (Signed)
History and Physical    [name] YCX:[id number] DOB: 1932/06/16 DOA: 04/01/2014  Referring physician: Dr. Ralene Bathe PCP: Gara Kroner, MD  Specialists: none   Chief Complaint: syncope  HPI: [name] is a 81 y.o. female has a past medical history significant for insulin-dependent diabetes mellitus, hypertension, hyperlipidemia, long-standing history of dizziness without apparent etiology, followed by neurology as an outpatient, presents to the emergency room with a chief complaint of a syncopal episode. She was today at her hair salon, and when she left she had a syncopal episode and passed out. She doesn't think that she was out for a long time, and some bystanders helped her up. She has had pretty severe dizziness in the past, however this is the first time she actually passing out. She endorses chest pains on and off today, sharp, on the left side of her chest, lasting about 1-2 minutes and subsided on their own. She denies chest pains in the past. She endorses mild abdominal pain which is chronic, no nausea or vomiting. She also endorses blood in her stool, she endorses a history of hemorrhoids. Up until today, she hasn't seen blood in her stool for long time. Describes it as on the tissue paper and not mixed with stool. She also endorses bilateral lower extremity swelling, right more than the left, and this is chronic for her. She denies any fever or chills, denies any shortness of breath. She endorses a mild headache.   In the emergency room, orthostatics were negative and she was found to be hypertensive into the 497W systolic, asymptomatic, her blood work is remarkable for mild elevation of her BNP but otherwise fairly unremarkable. [facility] asked to admit for syncopal episodes and chest pain.   Review of Systems:  as per history of present illness, otherwise 10 point review of systems negative   Past Medical History  Diagnosis Date  . Anxiety   . Migraine   . DM (diabetes mellitus)    . GERD (gastroesophageal reflux disease)   . HLD (hyperlipidemia)   . HTN (hypertension)   . History of cerebral aneurysm repair     s/p coiling  . Peripheral neuropathy   . IBS (irritable bowel syndrome)   . Diverticulosis   . Adenomatous colon polyp 1970     carcinoma in situ  . History of hemorrhoids     with bleeding  . Hiatal hernia 02/03/05  . History of shingles   . UTI (lower urinary tract infection)   . DDD (degenerative disc disease)   . Iron deficiency   . Acute torn meniscus of knee   . Toe fracture, right     second toe  . Fundic gland polyposis of stomach   . Duodenitis     peptic, with gastric heterotopia  . Endometriosis     s/p hysterectomy  . MVP (mitral valve prolapse)   . Allergic rhinitis   . Varicose vein   . Osteopenia   . Hypercholesteremia   . Benign essential tremor syndrome   . Heart murmur   . Amaurosis fugax 08/07/2012  . colon ca dx'd 1970    surg only  . Diverticulitis   . Abdominal pain, chronic, left lower quadrant    Past Surgical History  Procedure Laterality Date  . Right hand decompressive fasciotomy  08/26/07    , dorsal and volar  . Carpal tunnel release  08/26/07  . Appendectomy    . Colon resection    . Abdominal hysterectomy    .  Coronary angioplasty with stent placement    . Cerebral aneurysm repair    . Esophagogastroduodenoscopy  02/03/05    hiatal hernia, 6 benign gastric polyps  . Colonoscopy w/ biopsies and polypectomy  02/03/05    diverticulosis, 4 mm sessile polyps, internal and external hemorrhoids  . Colonoscopy  06/07/08    divertiulosis, internal hemorrhoids  . Tonsillectomy    . Flexible sigmoidoscopy    . Abdominal hysterectomy    . Cerebral aneurysm repair    . Cataract extraction    . Hand surgery Right   . Intraocular lens insertion     Social History:  reports that she has never smoked. She has never used smokeless tobacco. She reports that she does not drink alcohol or use illicit drugs.  Allergies    Allergen Reactions  . Actos [Pioglitazone]     Chronic  Pedal edema:contraindication  . Avandia [Rosiglitazone]     Chronic pedal edema:contraindication  . Fosamax [Alendronate Sodium]     unknown  . Ultram [Tramadol] Shortness Of Breath  . Glimepiride     hypersensitivity  . Januvia [Sitagliptin]     hypersensitivity  . Lipitor [Atorvastatin]     Causes muscle pain and swelling  . Metformin And Related     gastritis  . Prandin [Repaglinide]     Abdominal pain: side effects  . Tradjenta [Linagliptin]     GI problems, hypersensitivity  . Zoloft [Sertraline Hcl]     Jittery or zombie like  . Aspirin Other (See Comments)    Bleeding ulcers   . Bentyl [Dicyclomine Hcl]     Came close to passing out. weak  . Ciprofloxacin Nausea And Vomiting    Severe stomach upset  . Keflex [Cephalexin]   . Morphine And Related Other (See Comments)    Body Spasms    Family History  Problem Relation Age of Onset  . Ovarian cancer Mother   . Diabetes Brother   . Hypertension Brother   . Heart disease Father   . Kidney disease Father   . Heart disease Brother   . Diabetes Brother   . Heart disease Brother   . Microcephaly Brother   . Diabetes    . Colon cancer    . Diabetes Paternal Grandmother     Prior to Admission medications   Medication Sig Start Date End Date Taking? Authorizing Provider  acetaminophen (TYLENOL) 500 MG tablet Take 500 mg by mouth every 6 (six) hours as needed for pain.   Yes Historical Provider, MD  ALPRAZolam Duanne Moron) 0.5 MG tablet Take 0.5 mg by mouth at bedtime as needed for anxiety or sleep (sleep).    Yes Historical Provider, MD  atenolol (TENORMIN) 25 MG tablet Take 25 mg by mouth daily.   Yes Historical Provider, MD  Cholecalciferol (VITAMIN D-3) 1000 UNITS CAPS Take 1 capsule by mouth daily.   Yes Historical Provider, MD  Coenzyme Q10 100 MG capsule Take 100 mg by mouth daily.     Yes Historical Provider, MD  furosemide (LASIX) 20 MG tablet Take 20 mg  by mouth as needed. Take 1 tablet 3 times a week as needed for swelling.   Yes Historical Provider, MD  glycopyrrolate (ROBINUL-FORTE) 2 MG tablet Take 2 mg by mouth daily as needed (cramping or spasms).    Yes Historical Provider, MD  Insulin Glargine (LANTUS SOLOSTAR) 100 UNIT/ML SOPN Inject 24 Units into the skin daily. Once daily each morning   Yes Historical Provider, MD  Insulin Pen  Needle 32G X 4 MM MISC by Does not apply route. As directed,BD pen needle   Yes Historical Provider, MD  Multiple Vitamin (MULTIVITAMIN) tablet Take 1 tablet by mouth daily.     Yes Historical Provider, MD  pantoprazole (PROTONIX) 40 MG tablet TAKE 1 TABLET EVERY DAY 05/27/13  Yes Gatha Mayer, MD  trandolapril (MAVIK) 4 MG tablet Take 4 mg by mouth daily.     Yes Historical Provider, MD  hydrocortisone (ANUSOL-HC) 2.5 % rectal cream Place rectally 2 (two) times daily as needed for hemorrhoids. Apply a small amount to anal area as needed for hemorriods Patient not taking: Reported on 04/01/2014 05/01/12   Gatha Mayer, MD   Physical Exam: Filed Vitals:   04/01/14 1605  BP: 187/68  Pulse: 62  Temp: 97.6 F (36.4 C)  TempSrc: Oral  Resp: 20  SpO2: 100%     General:  No apparent distress, pleasant caucasian female  Eyes: PERRL, EOMI, no scleral icterus  ENT: moist oropharynx  Neck: supple, no lymphadenopathy  Cardiovascular: regular rate without MRG; 2+ peripheral pulses, no JVD, trace peripheral edema  Respiratory: CTA biL, good air movement without wheezing, rhonchi or crackled  Abdomen: soft, non tender to palpation, positive bowel sounds, no guarding, no rebound  Skin: no rashes  Musculoskeletal: normal bulk and tone, no joint swelling  Psychiatric: normal mood and affect  Neurologic: non focal  Labs on Admission:  Basic Metabolic Panel:  Recent Labs Lab 04/01/14 1635  NA 142  K 4.1  CL 108  CO2 28  GLUCOSE 135*  BUN 16  CREATININE 0.73  CALCIUM 9.0   CBC:  Recent  Labs Lab 04/01/14 1635  WBC 10.0  HGB 11.9*  HCT 38.2  MCV 79.4  PLT 182   Cardiac Enzymes:  Recent Labs Lab 04/01/14 1635  TROPONINI <0.03    BNP (last 3 results)  Recent Labs  04/01/14 1635  BNP 108.8*    CBG:  Recent Labs Lab 04/01/14 1608  GLUCAP 136*    Radiological Exams on Admission: Dg Chest 2 View  04/01/2014   CLINICAL DATA:  Syncope.  Shortness of breath.  Dizziness.  EXAM: CHEST  2 VIEW  COMPARISON:  12/12/2013  FINDINGS: Mild enlargement of the cardiopericardial silhouette. Atherosclerotic calcification of the aortic arch.  No edema. No pleural effusion identified. Mild thoracic spondylosis. Mild airway thickening noted.  IMPRESSION: 1. Airway thickening is present, suggesting bronchitis or reactive airways disease. 2. Mild enlargement of the cardiopericardial silhouette. 3. Atherosclerosis.   Electronically Signed   By: Van Clines M.D.   On: 04/01/2014 16:59   Ct Head Wo Contrast  04/01/2014   CLINICAL DATA:  Syncope, dizziness, fall backwards, history of aneurysm status post coil  EXAM: CT HEAD WITHOUT CONTRAST  TECHNIQUE: Contiguous axial images were obtained from the base of the skull through the vertex without intravenous contrast.  COMPARISON:  MRI brain dated 10/23/2013  FINDINGS: No evidence of parenchymal hemorrhage or extra-axial fluid collection. No mass lesion, mass effect, or midline shift.  No CT evidence of acute infarction.  Subcortical white matter and periventricular small vessel ischemic changes.  Prior coiling of a right supraclinoid ICA aneurysm.  The visualized paranasal sinuses are essentially clear. The mastoid air cells are unopacified.  No evidence of calvarial fracture.  IMPRESSION: No evidence of acute intracranial abnormality.  Prior right supraclinoid aneurysm coiling.   Electronically Signed   By: Julian Hy M.D.   On: 04/01/2014 16:50  EKG: Independently reviewed.  Assessment/Plan Principal Problem:    Syncope Active Problems:   GERD   Irritable bowel syndrome   Hypertension   Diabetes   Diverticulosis   Syncope - Will admit patient to observation/telemetry  - Cycle cardiac enzymes overnight given intermittent chest pains  - Her BNP is elevated, we'll obtain a 2-D echo - Check orthostatic vital signs in the morning  - Obtain PT consult  - CT scan head without acute findings  Accelerated hypertension  -  patient with elevation of her blood pressure in the emergency room into the 389H systolic, patient asymptomatic with this. Chart review regarding outpatient visits show blood pressure ranging from 734-287 systolic. She reports compliance with her medications, and she took today's atenolol as well as today's Mavik .  - restart her home medications, extra dose of Mavik tonight  - IV hydralazine as needed   Diabetes mellitus - Patient is on 24 units of Lantus, her sugars was 131 arrival to the ED after receiving a candy from a bystander.? Hypoglycemic episode - Restart Lantus at a lower dose - Sliding-scale insulin - Obtain a hemoglobin A1c  ?GI bleed - it sounds more hemorrhoidal given low amounts. She has history of diverticulosis as well. - We'll monitor CBC, if she has further bleeds or dropping hemoglobin will need gastroenterology evaluation.    Diet: carb modified Fluids: none DVT Prophylaxis: Lovenox  Code Status: Full  Family Communication: d/w husband bedside3  Disposition Plan: admit to telemetry  Time spent: 49  Costin M. Cruzita Lederer, MD [facility] Pager [phone]  If 7PM-7AM, please contact night-coverage [email or web address] Password [facility] 04/01/2014, 6:37 PM

## 2014-04-01 NOTE — Progress Notes (Signed)
Pt arrived to floor from ED on stretcher, stood and amb to BR w/ one assist for void. Pt assisted to bed, VSS. Denies pain/nausea/dizziness. Pt oriented to callbell and environment. Will report off to night shift RN.

## 2014-04-01 NOTE — Progress Notes (Signed)
CSW attempted to meet with pt at bedside. However, nurse was present.  CSW will attempt to meet with pt later.  Willette Brace [phone] ED CSW 04/01/2014 6:25 PM

## 2014-04-01 NOTE — ED Provider Notes (Signed)
CSN: [id number]     Arrival date & time 04/01/14  1548 History   First MD Initiated Contact with Patient 04/01/14 1614     Chief Complaint  Patient presents with  . Dizziness  . Loss of Consciousness     Patient is a 81 y.o. female presenting with dizziness and syncope. The history is provided by the patient. No language interpreter was used.  Dizziness Associated symptoms: syncope   Loss of Consciousness Associated symptoms: dizziness    [name] presents for evaluation of dizziness and loss of consciousness. She has a history of chronic dizziness. Today she felt like her dizziness was worse than previous episodes. She reports feeling unsteady and this is waxing and waning. Dizziness has been ongoing for months. She denies feeling well when she woke up this morning. She reported some shortness of breath and intermittent chest tightness in the morning. The child chest tightness is now gone. She reports that she's had increased lower extremity edema, left greater than right lower extremity. She also reports hematochezia, 3 bloody bowel movements today. She does have intermittent bloody bowel movements due to hemorrhoids. She also has a history of DVT. Today when she was leaving the hair salon she walked outside and felt dizzy, and fell backwards, striking her head. She did have loss of consciousness for an unclear amount of time. She does not recall any preceding symptoms other than dizziness.  Past Medical History  Diagnosis Date  . Anxiety   . Migraine   . DM (diabetes mellitus)   . GERD (gastroesophageal reflux disease)   . HLD (hyperlipidemia)   . HTN (hypertension)   . History of cerebral aneurysm repair     s/p coiling  . Peripheral neuropathy   . IBS (irritable bowel syndrome)   . Diverticulosis   . Adenomatous colon polyp 1970     carcinoma in situ  . History of hemorrhoids     with bleeding  . Hiatal hernia 02/03/05  . History of shingles   . UTI (lower urinary tract  infection)   . DDD (degenerative disc disease)   . Iron deficiency   . Acute torn meniscus of knee   . Toe fracture, right     second toe  . Fundic gland polyposis of stomach   . Duodenitis     peptic, with gastric heterotopia  . Endometriosis     s/p hysterectomy  . MVP (mitral valve prolapse)   . Allergic rhinitis   . Varicose vein   . Osteopenia   . Hypercholesteremia   . Benign essential tremor syndrome   . Heart murmur   . Amaurosis fugax 08/07/2012  . colon ca dx'd 1970    surg only  . Diverticulitis   . Abdominal pain, chronic, left lower quadrant    Past Surgical History  Procedure Laterality Date  . Right hand decompressive fasciotomy  08/26/07    , dorsal and volar  . Carpal tunnel release  08/26/07  . Appendectomy    . Colon resection    . Abdominal hysterectomy    . Coronary angioplasty with stent placement    . Cerebral aneurysm repair    . Esophagogastroduodenoscopy  02/03/05    hiatal hernia, 6 benign gastric polyps  . Colonoscopy w/ biopsies and polypectomy  02/03/05    diverticulosis, 4 mm sessile polyps, internal and external hemorrhoids  . Colonoscopy  06/07/08    divertiulosis, internal hemorrhoids  . Tonsillectomy    . Flexible sigmoidoscopy    .  Abdominal hysterectomy    . Cerebral aneurysm repair    . Cataract extraction    . Hand surgery Right   . Intraocular lens insertion     Family History  Problem Relation Age of Onset  . Ovarian cancer Mother   . Diabetes Brother   . Hypertension Brother   . Heart disease Father   . Kidney disease Father   . Heart disease Brother   . Diabetes Brother   . Heart disease Brother   . Microcephaly Brother   . Diabetes    . Colon cancer    . Diabetes Paternal Grandmother    History  Substance Use Topics  . Smoking status: Never Smoker   . Smokeless tobacco: Never Used  . Alcohol Use: No   OB History    No data available     Review of Systems  Cardiovascular: Positive for syncope.  Neurological:  Positive for dizziness.  All other systems reviewed and are negative.     Allergies  Actos; Avandia; Fosamax; Ultram; Glimepiride; Januvia; Lipitor; Metformin and related; Prandin; Tradjenta; Zoloft; Aspirin; Bentyl; Ciprofloxacin; Keflex; and Morphine and related  Home Medications   Prior to Admission medications   Medication Sig Start Date End Date Taking? Authorizing Provider  acetaminophen (TYLENOL) 500 MG tablet Take 500 mg by mouth every 6 (six) hours as needed for pain.    Historical Provider, MD  ALPRAZolam Duanne Moron) 0.5 MG tablet Take 0.5 mg by mouth at bedtime as needed for anxiety or sleep (sleep).     Historical Provider, MD  atenolol (TENORMIN) 50 MG tablet Take 50 mg by mouth daily.    Historical Provider, MD  Coenzyme Q10 100 MG capsule Take 100 mg by mouth daily.      Historical Provider, MD  furosemide (LASIX) 20 MG tablet Take 20 mg by mouth as needed. Take 1 tablet 3 times a week as needed for swelling.    Historical Provider, MD  glycopyrrolate (ROBINUL-FORTE) 2 MG tablet Take 2 mg by mouth daily as needed (cramping or spasms).     Historical Provider, MD  hydrocortisone (ANUSOL-HC) 2.5 % rectal cream Place rectally 2 (two) times daily as needed for hemorrhoids. Apply a small amount to anal area as needed for hemorriods 05/01/12   Gatha Mayer, MD  Insulin Glargine (LANTUS SOLOSTAR) 100 UNIT/ML SOPN Inject 24 Units into the skin daily. Once daily each morning    Historical Provider, MD  Insulin Pen Needle 32G X 4 MM MISC by Does not apply route. As directed,BD pen needle    Historical Provider, MD  Multiple Vitamin (MULTIVITAMIN) tablet Take 1 tablet by mouth daily.      Historical Provider, MD  pantoprazole (PROTONIX) 40 MG tablet TAKE 1 TABLET EVERY DAY 05/27/13   Gatha Mayer, MD  pantoprazole (PROTONIX) 40 MG tablet Take 40 mg by mouth daily.    Historical Provider, MD  trandolapril (MAVIK) 4 MG tablet Take 4 mg by mouth daily.      Historical Provider, MD   BP  187/68 mmHg  Pulse 62  Temp(Src) 97.6 F (36.4 C) (Oral)  Resp 20  SpO2 100% Physical Exam  Constitutional: She is oriented to person, place, and time. She appears well-developed and well-nourished. No distress.  HENT:  Head: Normocephalic and atraumatic.  Eyes: Pupils are equal, round, and reactive to light.  Cardiovascular: Normal rate and regular rhythm.   Faint SEM  Pulmonary/Chest: Effort normal and breath sounds normal. No respiratory distress.  Abdominal:  Soft. There is no tenderness. There is no rebound and no guarding.  Musculoskeletal: She exhibits no tenderness.  2+ nonpitting edema bilateral lower extremities.  Neurological: She is alert and oriented to person, place, and time.  moves all extremities symmetrically.  Skin: Skin is warm and dry.  Psychiatric: She has a normal mood and affect. Her behavior is normal.  Nursing note and vitals reviewed.   ED Course  Procedures (including critical care time) Labs Review Labs Reviewed  CBC - Abnormal; Notable for the following:    Hemoglobin 11.9 (*)    MCH 24.7 (*)    RDW 16.2 (*)    All other components within normal limits  BASIC METABOLIC PANEL - Abnormal; Notable for the following:    Glucose, Bld 135 (*)    GFR calc non Af Amer 79 (*)    All other components within normal limits  BRAIN NATRIURETIC PEPTIDE - Abnormal; Notable for the following:    B Natriuretic Peptide 108.8 (*)    All other components within normal limits  GLUCOSE, CAPILLARY - Abnormal; Notable for the following:    Glucose-Capillary 149 (*)    All other components within normal limits  CBG MONITORING, ED - Abnormal; Notable for the following:    Glucose-Capillary 136 (*)    All other components within normal limits  CBG MONITORING, ED - Abnormal; Notable for the following:    Glucose-Capillary 117 (*)    All other components within normal limits  URINE CULTURE  TROPONIN I  PROTIME-INR  URINALYSIS, ROUTINE W REFLEX MICROSCOPIC  TROPONIN  I  BASIC METABOLIC PANEL  CBC  TROPONIN I  TROPONIN I  HEMOGLOBIN A1C    Imaging Review Dg Chest 2 View  04/01/2014   CLINICAL DATA:  Syncope.  Shortness of breath.  Dizziness.  EXAM: CHEST  2 VIEW  COMPARISON:  12/12/2013  FINDINGS: Mild enlargement of the cardiopericardial silhouette. Atherosclerotic calcification of the aortic arch.  No edema. No pleural effusion identified. Mild thoracic spondylosis. Mild airway thickening noted.  IMPRESSION: 1. Airway thickening is present, suggesting bronchitis or reactive airways disease. 2. Mild enlargement of the cardiopericardial silhouette. 3. Atherosclerosis.   Electronically Signed   By: Van Clines M.D.   On: 04/01/2014 16:59   Ct Head Wo Contrast  04/01/2014   CLINICAL DATA:  Syncope, dizziness, fall backwards, history of aneurysm status post coil  EXAM: CT HEAD WITHOUT CONTRAST  TECHNIQUE: Contiguous axial images were obtained from the base of the skull through the vertex without intravenous contrast.  COMPARISON:  MRI brain dated 10/23/2013  FINDINGS: No evidence of parenchymal hemorrhage or extra-axial fluid collection. No mass lesion, mass effect, or midline shift.  No CT evidence of acute infarction.  Subcortical white matter and periventricular small vessel ischemic changes.  Prior coiling of a right supraclinoid ICA aneurysm.  The visualized paranasal sinuses are essentially clear. The mastoid air cells are unopacified.  No evidence of calvarial fracture.  IMPRESSION: No evidence of acute intracranial abnormality.  Prior right supraclinoid aneurysm coiling.   Electronically Signed   By: Julian Hy M.D.   On: 04/01/2014 16:50     EKG Interpretation None      Unable to upload EKG into MUSE. MDM   Final diagnoses:  Syncope, unspecified syncope type  Chest pain, unspecified chest pain type    Patient here for evaluation of syncopal event. She reports feeling poorly earlier today with some chest pain. Discussed with medicine  regarding admission for syncopal event  and chest pain observation. Pt not currently having chest pain in the emergency department.      Quintella Reichert, MD 04/01/14 2348

## 2014-04-01 NOTE — ED Notes (Signed)
cbg <600

## 2014-04-02 DIAGNOSIS — R55 Syncope and collapse: Secondary | ICD-10-CM

## 2014-04-02 DIAGNOSIS — I951 Orthostatic hypotension: Principal | ICD-10-CM

## 2014-04-02 DIAGNOSIS — R079 Chest pain, unspecified: Secondary | ICD-10-CM | POA: Insufficient documentation

## 2014-04-02 DIAGNOSIS — K921 Melena: Secondary | ICD-10-CM

## 2014-04-02 LAB — BASIC METABOLIC PANEL
Anion gap: 4 — ABNORMAL LOW (ref 5–15)
BUN: 17 mg/dL (ref 6–23)
CHLORIDE: 107 mmol/L (ref 96–112)
CO2: 29 mmol/L (ref 19–32)
CREATININE: 0.78 mg/dL (ref 0.50–1.10)
Calcium: 8.9 mg/dL (ref 8.4–10.5)
GFR, EST AFRICAN AMERICAN: 90 mL/min — AB (ref >90)
GFR, EST NON AFRICAN AMERICAN: 77 mL/min — AB (ref >90)
Glucose, Bld: 114 mg/dL — ABNORMAL HIGH (ref 70–99)
POTASSIUM: 3.9 mmol/L (ref 3.5–5.1)
Sodium: 140 mmol/L (ref 135–145)

## 2014-04-02 LAB — TROPONIN I
Troponin I: <0.03 ng/mL (ref <0.031)
Troponin I: <0.03 ng/mL (ref <0.031)

## 2014-04-02 LAB — CBC
HCT: 36.2 % (ref 36.0–46.0)
Hemoglobin: 11.3 g/dL — ABNORMAL LOW (ref 12.0–15.0)
MCH: 24.4 pg — ABNORMAL LOW (ref 26.0–34.0)
MCHC: 31.2 g/dL (ref 30.0–36.0)
MCV: 78 fL (ref 78.0–100.0)
PLATELETS: 183 10*3/uL (ref 150–400)
RBC: 4.64 MIL/uL (ref 3.87–5.11)
RDW: 15.9 % — AB (ref 11.5–15.5)
WBC: 9.5 10*3/uL (ref 4.0–10.5)

## 2014-04-02 LAB — GLUCOSE, CAPILLARY
Glucose-Capillary: 115 mg/dL — ABNORMAL HIGH (ref 70–99)
Glucose-Capillary: 136 mg/dL — ABNORMAL HIGH (ref 70–99)
Glucose-Capillary: 102 mg/dL — ABNORMAL HIGH (ref 70–99)
Glucose-Capillary: 110 mg/dL — ABNORMAL HIGH (ref 70–99)

## 2014-04-02 LAB — URINE CULTURE
Colony Count: NO GROWTH
Culture: NO GROWTH

## 2014-04-02 MED ORDER — INSULIN GLARGINE 100 UNIT/ML ~~LOC~~ SOLN
10.0000 [IU] | Freq: Every day | SUBCUTANEOUS | Status: DC
  Administered 2014-04-02 – 2014-04-03 (×2): 10 [IU] via SUBCUTANEOUS
  Filled 2014-04-02 (×2): qty 0.1

## 2014-04-02 MED ORDER — SODIUM CHLORIDE 0.9 % IV SOLN
INTRAVENOUS | Status: DC
  Administered 2014-04-02 – 2014-04-03 (×3): via INTRAVENOUS
  Filled 2014-04-02 (×4): qty 1000

## 2014-04-02 NOTE — Progress Notes (Signed)
   04/02/14 1429  PT Time Calculation  PT Start Time (ACUTE ONLY) 1350  PT Stop Time (ACUTE ONLY) 1411  PT Time Calculation (min) (ACUTE ONLY) 21 min  PT G-Codes **NOT FOR INPATIENT CLASS**  Functional Assessment Tool Used (clinical judgment)  Functional Limitation Mobility: Walking and moving around  Mobility: Walking and Moving Around Current Status (B0175) CI  Mobility: Walking and Moving Around Goal Status (Z0258) CI  PT General Charges  $$ ACUTE PT VISIT 1 Procedure  PT Evaluation  $Initial PT Evaluation Tier I 1 Procedure   Weston Anna, MPT [phone]

## 2014-04-02 NOTE — Progress Notes (Signed)
PROGRESS NOTE  [name] FYB:[id number] DOB: [birth year] DOA: 04/01/2014 PCP: Gara Kroner, MD  Brief history 81 year old female with a history of diabetes mellitus type 2, hypertension, hyperlipidemia, chronic dizziness, and cerebral aneurysm presents with syncope. The patient was walking out of a hair salon and was getting cues to get into her car, and the next thing she recalls somebody waking her up. She denied any postictal state or bowel or bladder incontinence. She states that she has had some dizziness with positional changes in the recent weeks. She has been having some chest discomfort that is sharp in nature at rest, but did not have any chest pain associated with her syncopal episode. She denies any recent fevers, chills, vomiting, diarrhea, abdominal pain, dysuria, hematuria. She did have a few episodes of hematochezia but it should be stressed this to her hemorrhoids. Assessment/Plan: Orthostatic hypotension -Likely multifactorial due to dehydration, diabetic polyneuropathy, and medications -Start IV fluids -Discontinue furosemide Syncope -Echocardiogram -Secondary to orthostatic hypotension -Orthostatics are positive -Hemoglobin is stable Hematochezia -Secondary to hemorrhoids -Colonoscopy 09/19/2013--diverticulosis and internal hemorrhoids -Hemoccult in stable -Trend hemoglobin Diabetes mellitus type 2 -Start reduced dose Lantus--she takes 18 units at home -Start Lantus 10 units daily -Hemoglobin A1c-pending- -NovoLog sliding scale Hypertension  -Poorly controlled  -Patient states that her systolic blood pressure at home running in the low 170s  -Continue atenolol and trandolapril and titrate as needed atypical chest pain -Troponins negative 3 -EKG without any ischemic changes  Family Communication:   Pt at beside Disposition Plan:   Home in 24-48 hrs        Procedures/Studies: Dg Chest 2 View  04/01/2014   CLINICAL DATA:  Syncope.   Shortness of breath.  Dizziness.  EXAM: CHEST  2 VIEW  COMPARISON:  12/12/2013  FINDINGS: Mild enlargement of the cardiopericardial silhouette. Atherosclerotic calcification of the aortic arch.  No edema. No pleural effusion identified. Mild thoracic spondylosis. Mild airway thickening noted.  IMPRESSION: 1. Airway thickening is present, suggesting bronchitis or reactive airways disease. 2. Mild enlargement of the cardiopericardial silhouette. 3. Atherosclerosis.   Electronically Signed   By: Van Clines M.D.   On: 04/01/2014 16:59   Ct Head Wo Contrast  04/01/2014   CLINICAL DATA:  Syncope, dizziness, fall backwards, history of aneurysm status post coil  EXAM: CT HEAD WITHOUT CONTRAST  TECHNIQUE: Contiguous axial images were obtained from the base of the skull through the vertex without intravenous contrast.  COMPARISON:  MRI brain dated 10/23/2013  FINDINGS: No evidence of parenchymal hemorrhage or extra-axial fluid collection. No mass lesion, mass effect, or midline shift.  No CT evidence of acute infarction.  Subcortical white matter and periventricular small vessel ischemic changes.  Prior coiling of a right supraclinoid ICA aneurysm.  The visualized paranasal sinuses are essentially clear. The mastoid air cells are unopacified.  No evidence of calvarial fracture.  IMPRESSION: No evidence of acute intracranial abnormality.  Prior right supraclinoid aneurysm coiling.   Electronically Signed   By: Julian Hy M.D.   On: 04/01/2014 16:50         Subjective: Patient denies fevers, chills, headache, chest pain, dyspnea, nausea, vomiting, diarrhea, abdominal pain, dysuria, hematuria   Objective: Filed Vitals:   04/01/14 1916 04/01/14 2100 04/02/14 0059 04/02/14 0618  BP: 124/89 187/63 169/60   Pulse: 123 60    Temp: 97.7 F (36.5 C) 97.7 F (36.5 C)  98.2 F (36.8 C)  TempSrc: Oral Oral  Oral  Resp: 20 20  18   Height:      Weight:      SpO2: 100% 97%  97%    Intake/Output  Summary (Last 24 hours) at 04/02/14 0729 Last data filed at 04/02/14 0608  Gross per 24 hour  Intake    243 ml  Output    950 ml  Net   -707 ml   Weight change:  Exam:   General:  Pt is alert, follows commands appropriately, not in acute distress  HEENT: No icterus, No thrush,  [place]/AT  Cardiovascular: RRR, S1/S2, no rubs, no gallops  Respiratory: CTA bilaterally, no wheezing, no crackles, no rhonchi  Abdomen: Soft/+BS, non tender, non distended, no guarding  Extremities: 2+LE edema, No lymphangitis, No petechiae, No rashes, no synovitis  Data Reviewed: Basic Metabolic Panel:  Recent Labs Lab 04/01/14 1635 04/02/14 0540  NA 142 140  K 4.1 3.9  CL 108 107  CO2 28 29  GLUCOSE 135* 114*  BUN 16 17  CREATININE 0.73 0.78  CALCIUM 9.0 8.9   Liver Function Tests: No results for input(s): AST, ALT, ALKPHOS, BILITOT, PROT, ALBUMIN in the last 168 hours. No results for input(s): LIPASE, AMYLASE in the last 168 hours. No results for input(s): AMMONIA in the last 168 hours. CBC:  Recent Labs Lab 04/01/14 1635 04/02/14 0540  WBC 10.0 9.5  HGB 11.9* 11.3*  HCT 38.2 36.2  MCV 79.4 78.0  PLT 182 183   Cardiac Enzymes:  Recent Labs Lab 04/01/14 1635 04/01/14 2230 04/02/14 0540  TROPONINI <0.03 <0.03 <0.03   BNP: Invalid input(s): POCBNP CBG:  Recent Labs Lab 04/01/14 1608 04/01/14 1842 04/01/14 2200  GLUCAP 136* 117* 149*    No results found for this or any previous visit (from the past 240 hour(s)).   Scheduled Meds: . atenolol  25 mg Oral Daily  . enoxaparin (LOVENOX) injection  40 mg Subcutaneous Q24H  . furosemide  20 mg Oral Daily  . insulin aspart  0-9 Units Subcutaneous TID WC  . insulin glargine  18 Units Subcutaneous Daily  . pantoprazole  40 mg Oral Daily  . sodium chloride  3 mL Intravenous Q12H  . trandolapril  4 mg Oral Daily   Continuous Infusions:    Aleecia Tapia, DO  [facility] Pager [phone]  If 7PM-7AM, please contact  night-coverage [email or web address] Password [facility] 04/02/2014, 7:29 AM

## 2014-04-02 NOTE — Progress Notes (Signed)
PT Cancellation Note  Patient Details Name: [name] MRN: [id number] DOB: 1932/12/10   Cancelled Treatment:    Reason Eval/Treat Not Completed: Patient at procedure or test/unavailable--will check back later as schedule permits. thanks.    Weston Anna, MPT Pager: [phone]

## 2014-04-02 NOTE — Progress Notes (Signed)
VASCULAR LAB PRELIMINARY  PRELIMINARY  PRELIMINARY  PRELIMINARY  Bilateral lower extremity venous duplex completed.    Preliminary report: Bilateral:  No evidence of DVT, superficial thrombosis, or Baker's Cyst.   [name], RVTS  04/02/2014, 3:55 PM

## 2014-04-02 NOTE — Care Management Note (Addendum)
    Page 1 of 1   04/02/2014     4:11:54 PM CARE MANAGEMENT NOTE 04/02/2014  Patient:  ADAMARYS, SHALL   Account Number:  [id number]  Date Initiated:  04/02/2014  Documentation initiated by:  Dessa Phi  Subjective/Objective Assessment:   81 y/o f admitted w/syncope.     Action/Plan:   From home w/spouse.   Anticipated DC Date:  04/03/2014   Anticipated DC Plan:  [place]  CM consult      Choice offered to / List presented to:  C-1 Patient           Status of service:  In process, will continue to follow Medicare Important Message given?   (If response is "NO", the following Medicare IM given date fields will be blank) Date Medicare IM given:   Medicare IM given by:   Date Additional Medicare IM given:   Additional Medicare IM given by:    Discharge Disposition:    Per UR Regulation:  Reviewed for med. necessity/level of care/duration of stay  If discussed at [place] of Stay Meetings, dates discussed:    Comments:  04/02/14 Dessa Phi RN BSN NCM [place].CC44 given.Patient/spouse voiced understanding.PT-HH. [facility] for [facility]. Tc Kristen aware of hhpt.await final HHPT order.

## 2014-04-02 NOTE — Evaluation (Signed)
Physical Therapy Evaluation Patient Details Name: [name] MRN: [id number] DOB: 1932/09/18 Today's Date: 04/02/2014   History of Present Illness  81 yo female admitted with syncope, LOC, fall. Hx of DM, chronic dizziness, DVT, peripheral neuropathy, HTN, aneurysm repair.   Clinical Impression  On eval, pt required Min guard assist for mobility-able to ambulate ~200 feet with RW. Pt tolerated activity well. Denied dizziness. Recommend HHPT at discharge to maximize independence and safety with functional mobility. BP: supine-165/105, sitting-168/66, standing-152/58    Follow Up Recommendations Home health PT    Equipment Recommendations  None recommended by PT    Recommendations for Other Services OT consult     Precautions / Restrictions Precautions Precautions: Fall Restrictions Weight Bearing Restrictions: No      Mobility  Bed Mobility Overal bed mobility: Needs Assistance Bed Mobility: Supine to Sit     Supine to sit: Min guard     General bed mobility comments: close guard for safety  Transfers Overall transfer level: Needs assistance   Transfers: Sit to/from Stand Sit to Stand: Min guard         General transfer comment: close guard for safety.   Ambulation/Gait Ambulation/Gait assistance: Min guard Ambulation Distance (Feet): 200 Feet Assistive device: 4-wheeled walker Gait Pattern/deviations: Step-through pattern;Trunk flexed     General Gait Details: Pt used Modified RW from home (standard RW with 4 wheels). close guard for safety. Pt denied dizziness. Tolerated activity well.   Stairs            Wheelchair Mobility    Modified Rankin (Stroke Patients Only)       Balance Overall balance assessment: Needs assistance;History of Falls         Standing balance support: Bilateral upper extremity supported;During functional activity Standing balance-Leahy Scale: Poor Standing balance comment: Pt able to walk short distance from  bathroom without walker.                              Pertinent Vitals/Pain Pain Assessment: Faces Faces Pain Scale: Hurts little more Pain Location: pelvic, LEs-possibly from fall Pain Descriptors / Indicators: Sore Pain Intervention(s): Monitored during session;Repositioned    Home Living Family/patient expects to be discharged to:: Private residence Living Arrangements: Spouse/significant other   Type of Home: House Home Access: Level entry     Home Layout: One level Home Equipment: Environmental consultant - 2 wheels;Cane - single point      Prior Function Level of Independence: Independent with assistive device(s)               Hand Dominance        Extremity/Trunk Assessment   Upper Extremity Assessment: Overall WFL for tasks assessed           Lower Extremity Assessment: Generalized weakness      Cervical / Trunk Assessment: Kyphotic  Communication   Communication: No difficulties  Cognition Arousal/Alertness: Awake/alert Behavior During Therapy: WFL for tasks assessed/performed Overall Cognitive Status: Within Functional Limits for tasks assessed                      General Comments      Exercises        Assessment/Plan    PT Assessment Patient needs continued PT services  PT Diagnosis Difficulty walking;Generalized weakness;Acute pain   PT Problem List Decreased strength;Decreased activity tolerance;Decreased balance;Decreased mobility;Decreased knowledge of use of DME;Pain  PT Treatment Interventions DME instruction;Gait training;Functional  mobility training;Therapeutic activities;Therapeutic exercise;Patient/family education;Balance training   PT Goals (Current goals can be found in the Care Plan section) Acute Rehab PT Goals Patient Stated Goal: home soon.  PT Goal Formulation: With patient/family Time For Goal Achievement: 04/16/14 Potential to Achieve Goals: Good    Frequency Min 3X/week   Barriers to discharge         Co-evaluation               End of Session Equipment Utilized During Treatment: Gait belt Activity Tolerance: Patient tolerated treatment well Patient left: in chair;with call bell/phone within reach;with family/visitor present           Time: 1350-1411 PT Time Calculation (min) (ACUTE ONLY): 21 min   Charges:   PT Evaluation $Initial PT Evaluation Tier I: 1 Procedure     PT G Codes:        Weston Anna, MPT Pager: [phone]

## 2014-04-02 NOTE — Progress Notes (Signed)
Echocardiogram 2D Echocardiogram has been performed.  Nyaire Denbleyker 04/02/2014, 12:38 PM

## 2014-04-03 LAB — BASIC METABOLIC PANEL
Anion gap: 7 (ref 5–15)
BUN: 17 mg/dL (ref 6–23)
CALCIUM: 8.5 mg/dL (ref 8.4–10.5)
CO2: 24 mmol/L (ref 19–32)
CREATININE: 0.86 mg/dL (ref 0.50–1.10)
Chloride: 106 mmol/L (ref 96–112)
GFR calc Af Amer: 73 mL/min — ABNORMAL LOW (ref >90)
GFR, EST NON AFRICAN AMERICAN: 63 mL/min — AB (ref >90)
GLUCOSE: 154 mg/dL — AB (ref 70–99)
Potassium: 3.8 mmol/L (ref 3.5–5.1)
Sodium: 137 mmol/L (ref 135–145)

## 2014-04-03 LAB — GLUCOSE, CAPILLARY
GLUCOSE-CAPILLARY: 129 mg/dL — AB (ref 70–99)
GLUCOSE-CAPILLARY: 145 mg/dL — AB (ref 70–99)
GLUCOSE-CAPILLARY: 144 mg/dL — AB (ref 70–99)

## 2014-04-03 LAB — HEMOGLOBIN A1C
HEMOGLOBIN A1C: 7.1 % — AB (ref 4.8–5.6)
Mean Plasma Glucose: 157 mg/dL

## 2014-04-03 MED ORDER — AMLODIPINE BESYLATE 2.5 MG PO TABS: 2.5000 mg | ORAL_TABLET | Freq: Every day | ORAL | Status: DC

## 2014-04-03 MED ORDER — ONDANSETRON HCL 4 MG/2ML IJ SOLN
4.0000 mg | Freq: Once | INTRAMUSCULAR | Status: AC
  Administered 2014-04-03: 4 mg via INTRAVENOUS
  Filled 2014-04-03: qty 2

## 2014-04-03 MED ORDER — AMLODIPINE BESYLATE 5 MG PO TABS
2.5000 mg | ORAL_TABLET | Freq: Every day | ORAL | Status: DC
  Administered 2014-04-03: 2.5 mg via ORAL
  Filled 2014-04-03: qty 1

## 2014-04-03 NOTE — Progress Notes (Signed)
Patient reported not feeling good and sweating, and requested glucose finger stick. CBG result 129. Remained with patient.

## 2014-04-03 NOTE — Discharge Summary (Addendum)
Physician Discharge Summary  [name] XHB:[id number] DOB: [birth year] DOA: 04/01/2014  PCP: Gara Kroner, MD  Admit date: 04/01/2014 Discharge date: 04/03/2014  Recommendations for Outpatient Follow-up:  1. Pt will need to follow up with PCP in 2 weeks post discharge Please obtain BMP and CBC 1-2 weeks Discharge Diagnoses:  Orthostatic hypotension -Likely multifactorial due to dehydration, diabetic polyneuropathy, and medications -Started IV fluids -Discontinue furosemide -After fluid rehydration, the patient's repeat orthostatic vital signs were negative Syncope -Echocardiogram--EF 60% -Secondary to orthostatic hypotension -Orthostatics are positive -Hemoglobin is stable Hematochezia -Secondary to hemorrhoids -Colonoscopy 09/19/2013--diverticulosis and internal hemorrhoids -Hgb stable  Diabetes mellitus type 2 -Start reduced dose Lantus--she takes 18 units at home -Start Lantus 10 units daily--after discharge, the patient can resume her home usual dose of Lantus which is 18 units daily -Hemoglobin A1c-7.1 -NovoLog sliding scale Hypertension  -Poorly controlled  -Patient states that her systolic blood pressure at home running in the low 170s  -Continue atenolol and trandolapril and titrate as needed -add amlodipine 2.5mg  daily atypical chest pain -Troponins negative 3 -EKG without any ischemic changes  Discharge Condition: stable   Disposition: home  Diet:carb modified Wt Readings from Last 3 Encounters:  04/01/14 80.65 kg (177 lb 12.8 oz)  01/08/14 79.379 kg (175 lb)  09/19/13 77.111 kg (170 lb)    History of present illness:  81 year old female with a history of diabetes mellitus type 2, hypertension, hyperlipidemia, chronic dizziness, and cerebral aneurysm presents with syncope. The patient was walking out of a hair salon and was getting cues to get into her car, and the next thing she recalls somebody waking her up. She denied any postictal state or  bowel or bladder incontinence. She states that she has had some dizziness with positional changes in the recent weeks. She has been having some chest discomfort that is sharp in nature at rest, but did not have any chest pain associated with her syncopal episode. She denies any recent fevers, chills, vomiting, diarrhea, abdominal pain, dysuria, hematuria. She did have a few episodes of hematochezia but it should be stressed this to her hemorrhoids. The patient's hemoglobin remained stable. The patient was found to have orthostatic hypotension. She was started on intravenous fluids for 24 hours. Repeat orthostatics were negative. The patient states that she has felt this good in many weeks. Echocardiogram was performed and showed EF 60 with mild aortic root dilatation. This appears to be unchanged from previous echocardiogram back in 2009.   Discharge Exam: Filed Vitals:   04/03/14 1027  BP: 168/56  Pulse: 71  Temp: 97.8 F (36.6 C)  Resp: 18   Filed Vitals:   04/03/14 0331 04/03/14 0500 04/03/14 0619 04/03/14 1027  BP:  150/80 177/62 168/56  Pulse: 69  65 71  Temp: 98.2 F (36.8 C)  98.2 F (36.8 C) 97.8 F (36.6 C)  TempSrc: Oral  Oral Oral  Resp: 18  18 18   Height:      Weight:      SpO2: 97%  98% 97%   General: A&O x 3, NAD, pleasant, cooperative Cardiovascular: RRR, no rub, no gallop, no S3 Respiratory: CTAB, no wheeze, no rhonchi Abdomen:soft, nontender, nondistended, positive bowel sounds Extremities: 1+LE edema, No lymphangitis, no petechiae  Discharge Instructions      Discharge Instructions    Diet - low sodium heart healthy    Complete by:  As directed      Increase activity slowly    Complete by:  As directed  Medication List    TAKE these medications        acetaminophen 500 MG tablet  Commonly known as:  TYLENOL  Take 500 mg by mouth every 6 (six) hours as needed for pain.     ALPRAZolam 0.5 MG tablet  Commonly known as:  XANAX  Take 0.5  mg by mouth at bedtime as needed for anxiety or sleep (sleep).     amLODipine 2.5 MG tablet  Commonly known as:  NORVASC  Take 1 tablet (2.5 mg total) by mouth daily.  Start taking on:  04/04/2014     atenolol 25 MG tablet  Commonly known as:  TENORMIN  Take 25 mg by mouth daily.     Coenzyme Q10 100 MG capsule  Take 100 mg by mouth daily.     furosemide 20 MG tablet  Commonly known as:  LASIX  Take 20 mg by mouth as needed. Take 1 tablet 3 times a week as needed for swelling.     hydrocortisone 2.5 % rectal cream  Commonly known as:  ANUSOL-HC  Place rectally 2 (two) times daily as needed for hemorrhoids. Apply a small amount to anal area as needed for hemorriods     Insulin Pen Needle 32G X 4 MM Misc  by Does not apply route. As directed,BD pen needle     LANTUS SOLOSTAR 100 UNIT/ML Solostar Pen  Generic drug:  Insulin Glargine  Inject 24 Units into the skin daily. Once daily each morning     multivitamin tablet  Take 1 tablet by mouth daily.     pantoprazole 40 MG tablet  Commonly known as:  PROTONIX  TAKE 1 TABLET EVERY DAY     ROBINUL-FORTE 2 MG tablet  Generic drug:  glycopyrrolate  Take 2 mg by mouth daily as needed (cramping or spasms).     trandolapril 4 MG tablet  Commonly known as:  MAVIK  Take 4 mg by mouth daily.     Vitamin D-3 1000 UNITS Caps  Take 1 capsule by mouth daily.         The results of significant diagnostics from this hospitalization (including imaging, microbiology, ancillary and laboratory) are listed below for reference.    Significant Diagnostic Studies: Dg Chest 2 View  04/01/2014   CLINICAL DATA:  Syncope.  Shortness of breath.  Dizziness.  EXAM: CHEST  2 VIEW  COMPARISON:  12/12/2013  FINDINGS: Mild enlargement of the cardiopericardial silhouette. Atherosclerotic calcification of the aortic arch.  No edema. No pleural effusion identified. Mild thoracic spondylosis. Mild airway thickening noted.  IMPRESSION: 1. Airway thickening is  present, suggesting bronchitis or reactive airways disease. 2. Mild enlargement of the cardiopericardial silhouette. 3. Atherosclerosis.   Electronically Signed   By: Van Clines M.D.   On: 04/01/2014 16:59   Ct Head Wo Contrast  04/01/2014   CLINICAL DATA:  Syncope, dizziness, fall backwards, history of aneurysm status post coil  EXAM: CT HEAD WITHOUT CONTRAST  TECHNIQUE: Contiguous axial images were obtained from the base of the skull through the vertex without intravenous contrast.  COMPARISON:  MRI brain dated 10/23/2013  FINDINGS: No evidence of parenchymal hemorrhage or extra-axial fluid collection. No mass lesion, mass effect, or midline shift.  No CT evidence of acute infarction.  Subcortical white matter and periventricular small vessel ischemic changes.  Prior coiling of a right supraclinoid ICA aneurysm.  The visualized paranasal sinuses are essentially clear. The mastoid air cells are unopacified.  No evidence of calvarial fracture.  IMPRESSION: No evidence of acute intracranial abnormality.  Prior right supraclinoid aneurysm coiling.   Electronically Signed   By: Julian Hy M.D.   On: 04/01/2014 16:50     Microbiology: Recent Results (from the past 240 hour(s))  Urine culture     Status: None   Collection Time: 04/01/14  4:27 PM  Result Value Ref Range Status   Specimen Description URINE, CLEAN CATCH  Final   Special Requests NONE  Final   Colony Count NO GROWTH Performed at [facility]   Final   Culture NO GROWTH Performed at [facility]   Final   Report Status 04/02/2014 FINAL  Final     Labs: Basic Metabolic Panel:  Recent Labs Lab 04/01/14 1635 04/02/14 0540 04/03/14 0527  NA 142 140 137  K 4.1 3.9 3.8  CL 108 107 106  CO2 28 29 24   GLUCOSE 135* 114* 154*  BUN 16 17 17   CREATININE 0.73 0.78 0.86  CALCIUM 9.0 8.9 8.5   Liver Function Tests: No results for input(s): AST, ALT, ALKPHOS, BILITOT, PROT, ALBUMIN in the last 168  hours. No results for input(s): LIPASE, AMYLASE in the last 168 hours. No results for input(s): AMMONIA in the last 168 hours. CBC:  Recent Labs Lab 04/01/14 1635 04/02/14 0540  WBC 10.0 9.5  HGB 11.9* 11.3*  HCT 38.2 36.2  MCV 79.4 78.0  PLT 182 183   Cardiac Enzymes:  Recent Labs Lab 04/01/14 1635 04/01/14 2230 04/02/14 0540 04/02/14 1045  TROPONINI <0.03 <0.03 <0.03 <0.03   BNP: Invalid input(s): POCBNP CBG:  Recent Labs Lab 04/02/14 1658 04/02/14 2140 04/03/14 0329 04/03/14 0811 04/03/14 1203  GLUCAP 102* 110* 129* 145* 144*    Time coordinating discharge:  Greater than 30 minutes  Signed:  Lauralei Clouse, DO [facility] Pager: [phone] 04/03/2014, 12:30 PM

## 2014-04-03 NOTE — Progress Notes (Signed)
Physical Therapy Treatment Patient Details Name: ANTONIA JICHA MRN: [id number] DOB: 05-30-1932 Today's Date: 04/03/2014    History of Present Illness 81 yo female admitted with syncope, LOC, fall. Hx of DM, chronic dizziness, DVT, peripheral neuropathy, HTN, aneurysm repair.     PT Comments    Pt continues to participate well with session. Pt did report mild lightheadedness/dizziness once back in room after walking. No LOB. Continue to recommend HHPT.   Follow Up Recommendations  Home health PT     Equipment Recommendations  None recommended by PT    Recommendations for Other Services OT consult     Precautions / Restrictions Precautions Precautions: Fall Restrictions Weight Bearing Restrictions: No    Mobility  Bed Mobility               General bed mobility comments: pt sitting EOB  Transfers Overall transfer level: Needs assistance   Transfers: Sit to/from Stand Sit to Stand: Supervision         General transfer comment: supervision for safety  Ambulation/Gait Ambulation/Gait assistance: Supervision Ambulation Distance (Feet): 205 Feet Assistive device: 4-wheeled walker Gait Pattern/deviations: Trunk flexed;Step-through pattern     General Gait Details: close supervision for safety. No LOB. Pt did report mild lightheadedness/dizziness once back in room after walking. Pt tolerated activity well.    Stairs            Wheelchair Mobility    Modified Rankin (Stroke Patients Only)       Balance                                    Cognition Arousal/Alertness: Awake/alert Behavior During Therapy: WFL for tasks assessed/performed Overall Cognitive Status: Within Functional Limits for tasks assessed                      Exercises      General Comments        Pertinent Vitals/Pain Pain Assessment: No/denies pain    Home Living                      Prior Function            PT Goals (current  goals can now be found in the care plan section) Progress towards PT goals: Progressing toward goals    Frequency  Min 3X/week    PT Plan Current plan remains appropriate    Co-evaluation             End of Session Equipment Utilized During Treatment: Gait belt Activity Tolerance: Patient tolerated treatment well Patient left: with call bell/phone within reach;with family/visitor present (sitting EOB)     Time: 8144-8185 PT Time Calculation (min) (ACUTE ONLY): 9 min  Charges:  $Gait Training: 8-22 mins                    G Codes:  Functional Assessment Tool Used: clinical judgement Functional Limitation: Mobility: Walking and moving around Mobility: Walking and Moving Around Current Status [phone]): At least 1 percent but less than 20 percent impaired, limited or restricted Mobility: Walking and Moving Around Goal Status [phone]): At least 1 percent but less than 20 percent impaired, limited or restricted Mobility: Walking and Moving Around Discharge Status [phone]): At least 1 percent but less than 20 percent impaired, limited or restricted   Weston Anna, MPT Pager: [phone]

## 2014-05-19 ENCOUNTER — Other Ambulatory Visit: Payer: Self-pay | Admitting: Internal Medicine

## 2014-08-01 ENCOUNTER — Other Ambulatory Visit: Payer: Self-pay | Admitting: Internal Medicine

## 2014-08-26 ENCOUNTER — Encounter: Payer: Self-pay | Admitting: Internal Medicine

## 2014-08-26 ENCOUNTER — Encounter: Payer: Self-pay | Admitting: Gastroenterology

## 2014-09-24 ENCOUNTER — Other Ambulatory Visit: Payer: Self-pay | Admitting: Internal Medicine

## 2014-10-21 ENCOUNTER — Other Ambulatory Visit (HOSPITAL_COMMUNITY): Payer: Self-pay | Admitting: Interventional Radiology

## 2014-10-21 DIAGNOSIS — I671 Cerebral aneurysm, nonruptured: Secondary | ICD-10-CM

## 2014-10-29 ENCOUNTER — Ambulatory Visit (HOSPITAL_COMMUNITY): Payer: Medicare Other

## 2014-10-29 ENCOUNTER — Ambulatory Visit (HOSPITAL_COMMUNITY): Admission: RE | Admit: 2014-10-29 | Payer: Medicare Other | Source: Ambulatory Visit

## 2014-11-06 ENCOUNTER — Ambulatory Visit (HOSPITAL_COMMUNITY): Admission: RE | Admit: 2014-11-06 | Payer: Medicare Other | Source: Ambulatory Visit

## 2014-11-12 ENCOUNTER — Ambulatory Visit (HOSPITAL_COMMUNITY)
Admission: RE | Admit: 2014-11-12 | Discharge: 2014-11-12 | Disposition: A | Discharge status: Home / Self Care | Payer: Medicare Other | Source: Ambulatory Visit | Attending: Interventional Radiology | Admitting: Interventional Radiology

## 2014-11-12 ENCOUNTER — Ambulatory Visit (HOSPITAL_COMMUNITY): Payer: Medicare Other

## 2014-11-12 DIAGNOSIS — I671 Cerebral aneurysm, nonruptured: Secondary | ICD-10-CM | POA: Diagnosis present

## 2014-11-12 LAB — CREATININE, SERUM
CREATININE: 0.91 mg/dL (ref 0.44–1.00)
GFR calc Af Amer: >60 mL/min (ref >60)
GFR calc non Af Amer: 58 mL/min — ABNORMAL LOW (ref >60)

## 2014-11-12 MED ORDER — GADOBENATE DIMEGLUMINE 529 MG/ML IV SOLN
15.0000 mL | Freq: Once | INTRAVENOUS | Status: AC | PRN
  Administered 2014-11-12: 15 mL via INTRAVENOUS

## 2014-11-13 ENCOUNTER — Other Ambulatory Visit (HOSPITAL_COMMUNITY): Payer: Self-pay | Admitting: Interventional Radiology

## 2014-11-13 DIAGNOSIS — I729 Aneurysm of unspecified site: Secondary | ICD-10-CM

## 2014-11-13 DIAGNOSIS — G459 Transient cerebral ischemic attack, unspecified: Secondary | ICD-10-CM

## 2014-11-24 ENCOUNTER — Ambulatory Visit (HOSPITAL_COMMUNITY): Admission: RE | Admit: 2014-11-24 | Payer: Medicare Other | Source: Ambulatory Visit

## 2014-11-29 ENCOUNTER — Other Ambulatory Visit: Payer: Self-pay | Admitting: Internal Medicine

## 2014-12-09 ENCOUNTER — Ambulatory Visit (HOSPITAL_COMMUNITY)
Admission: RE | Admit: 2014-12-09 | Discharge: 2014-12-09 | Disposition: A | Discharge status: Home / Self Care | Payer: Medicare Other | Source: Ambulatory Visit | Attending: Interventional Radiology | Admitting: Interventional Radiology

## 2014-12-09 DIAGNOSIS — G459 Transient cerebral ischemic attack, unspecified: Secondary | ICD-10-CM

## 2014-12-09 DIAGNOSIS — I729 Aneurysm of unspecified site: Secondary | ICD-10-CM

## 2014-12-27 ENCOUNTER — Encounter (HOSPITAL_COMMUNITY): Payer: Self-pay | Admitting: Emergency Medicine

## 2014-12-27 ENCOUNTER — Emergency Department (HOSPITAL_COMMUNITY)
Admission: EM | Admit: 2014-12-27 | Discharge: 2014-12-27 | Disposition: A | Discharge status: Home / Self Care | Payer: Medicare Other | Attending: Emergency Medicine | Admitting: Emergency Medicine

## 2014-12-27 ENCOUNTER — Emergency Department (HOSPITAL_COMMUNITY): Payer: Medicare Other

## 2014-12-27 DIAGNOSIS — Z87440 Personal history of urinary (tract) infections: Secondary | ICD-10-CM | POA: Diagnosis not present

## 2014-12-27 DIAGNOSIS — Z87828 Personal history of other (healed) physical injury and trauma: Secondary | ICD-10-CM | POA: Diagnosis not present

## 2014-12-27 DIAGNOSIS — F419 Anxiety disorder, unspecified: Secondary | ICD-10-CM | POA: Diagnosis not present

## 2014-12-27 DIAGNOSIS — K219 Gastro-esophageal reflux disease without esophagitis: Secondary | ICD-10-CM | POA: Insufficient documentation

## 2014-12-27 DIAGNOSIS — R1030 Lower abdominal pain, unspecified: Secondary | ICD-10-CM | POA: Diagnosis not present

## 2014-12-27 DIAGNOSIS — Z8742 Personal history of other diseases of the female genital tract: Secondary | ICD-10-CM | POA: Insufficient documentation

## 2014-12-27 DIAGNOSIS — Z794 Long term (current) use of insulin: Secondary | ICD-10-CM | POA: Insufficient documentation

## 2014-12-27 DIAGNOSIS — E119 Type 2 diabetes mellitus without complications: Secondary | ICD-10-CM | POA: Diagnosis not present

## 2014-12-27 DIAGNOSIS — I10 Essential (primary) hypertension: Secondary | ICD-10-CM | POA: Diagnosis not present

## 2014-12-27 DIAGNOSIS — E785 Hyperlipidemia, unspecified: Secondary | ICD-10-CM | POA: Diagnosis not present

## 2014-12-27 DIAGNOSIS — R079 Chest pain, unspecified: Secondary | ICD-10-CM | POA: Insufficient documentation

## 2014-12-27 DIAGNOSIS — Z86010 Personal history of colonic polyps: Secondary | ICD-10-CM | POA: Diagnosis not present

## 2014-12-27 DIAGNOSIS — I341 Nonrheumatic mitral (valve) prolapse: Secondary | ICD-10-CM | POA: Diagnosis not present

## 2014-12-27 DIAGNOSIS — R1031 Right lower quadrant pain: Secondary | ICD-10-CM | POA: Insufficient documentation

## 2014-12-27 DIAGNOSIS — Z8739 Personal history of other diseases of the musculoskeletal system and connective tissue: Secondary | ICD-10-CM | POA: Insufficient documentation

## 2014-12-27 DIAGNOSIS — Z79899 Other long term (current) drug therapy: Secondary | ICD-10-CM | POA: Diagnosis not present

## 2014-12-27 DIAGNOSIS — Z8509 Personal history of malignant neoplasm of other digestive organs: Secondary | ICD-10-CM | POA: Insufficient documentation

## 2014-12-27 DIAGNOSIS — G8929 Other chronic pain: Secondary | ICD-10-CM | POA: Diagnosis not present

## 2014-12-27 DIAGNOSIS — Z9889 Other specified postprocedural states: Secondary | ICD-10-CM | POA: Insufficient documentation

## 2014-12-27 DIAGNOSIS — R109 Unspecified abdominal pain: Secondary | ICD-10-CM | POA: Diagnosis present

## 2014-12-27 DIAGNOSIS — E7800 Pure hypercholesterolemia, unspecified: Secondary | ICD-10-CM | POA: Diagnosis not present

## 2014-12-27 DIAGNOSIS — G43909 Migraine, unspecified, not intractable, without status migrainosus: Secondary | ICD-10-CM | POA: Insufficient documentation

## 2014-12-27 DIAGNOSIS — R011 Cardiac murmur, unspecified: Secondary | ICD-10-CM | POA: Diagnosis not present

## 2014-12-27 DIAGNOSIS — Z862 Personal history of diseases of the blood and blood-forming organs and certain disorders involving the immune mechanism: Secondary | ICD-10-CM | POA: Diagnosis not present

## 2014-12-27 LAB — CBC WITH DIFFERENTIAL/PLATELET
BASOS ABS: 0 10*3/uL (ref 0.0–0.1)
BASOS PCT: 0 %
Eosinophils Absolute: 0.1 10*3/uL (ref 0.0–0.7)
Eosinophils Relative: 1 %
HEMATOCRIT: 38.8 % (ref 36.0–46.0)
Hemoglobin: 12.2 g/dL (ref 12.0–15.0)
LYMPHS PCT: 28 %
Lymphs Abs: 3.6 10*3/uL (ref 0.7–4.0)
MCH: 24.3 pg — ABNORMAL LOW (ref 26.0–34.0)
MCHC: 31.4 g/dL (ref 30.0–36.0)
MCV: 77.1 fL — AB (ref 78.0–100.0)
Monocytes Absolute: 0.5 10*3/uL (ref 0.1–1.0)
Monocytes Relative: 4 %
NEUTROS ABS: 8.5 10*3/uL — AB (ref 1.7–7.7)
NEUTROS PCT: 67 %
PLATELETS: 193 10*3/uL (ref 150–400)
RBC: 5.03 MIL/uL (ref 3.87–5.11)
RDW: 16.1 % — AB (ref 11.5–15.5)
WBC: 12.8 10*3/uL — AB (ref 4.0–10.5)

## 2014-12-27 LAB — COMPREHENSIVE METABOLIC PANEL
ALBUMIN: 3.7 g/dL (ref 3.5–5.0)
ALT: 18 U/L (ref 14–54)
AST: 20 U/L (ref 15–41)
Alkaline Phosphatase: 55 U/L (ref 38–126)
Anion gap: 6 (ref 5–15)
BILIRUBIN TOTAL: 0.3 mg/dL (ref 0.3–1.2)
BUN: 17 mg/dL (ref 6–20)
CHLORIDE: 106 mmol/L (ref 101–111)
CO2: 28 mmol/L (ref 22–32)
CREATININE: 0.92 mg/dL (ref 0.44–1.00)
Calcium: 9.2 mg/dL (ref 8.9–10.3)
GFR calc Af Amer: >60 mL/min (ref >60)
GFR, EST NON AFRICAN AMERICAN: 57 mL/min — AB (ref >60)
GLUCOSE: 134 mg/dL — AB (ref 65–99)
POTASSIUM: 4.4 mmol/L (ref 3.5–5.1)
Sodium: 140 mmol/L (ref 135–145)
Total Protein: 6.7 g/dL (ref 6.5–8.1)

## 2014-12-27 LAB — URINALYSIS, ROUTINE W REFLEX MICROSCOPIC
Bilirubin Urine: NEGATIVE
GLUCOSE, UA: NEGATIVE mg/dL
HGB URINE DIPSTICK: NEGATIVE
Ketones, ur: NEGATIVE mg/dL
LEUKOCYTES UA: NEGATIVE
Nitrite: NEGATIVE
PH: 6.5 (ref 5.0–8.0)
Protein, ur: NEGATIVE mg/dL
Specific Gravity, Urine: 1.013 (ref 1.005–1.030)

## 2014-12-27 LAB — LIPASE, BLOOD: LIPASE: 21 U/L (ref 11–51)

## 2014-12-27 MED ORDER — HYDROCODONE-ACETAMINOPHEN 5-325 MG PO TABS: 1.0000 | ORAL_TABLET | Freq: Four times a day (QID) | ORAL | Status: DC | PRN

## 2014-12-27 MED ORDER — SODIUM CHLORIDE 0.9 % IV SOLN
1000.0000 mL | INTRAVENOUS | Status: DC
  Administered 2014-12-27: 1000 mL via INTRAVENOUS

## 2014-12-27 MED ORDER — ONDANSETRON 8 MG PO TBDP: 8.0000 mg | ORAL_TABLET | Freq: Once | ORAL | Status: DC

## 2014-12-27 MED ORDER — FENTANYL CITRATE (PF) 100 MCG/2ML IJ SOLN
12.5000 ug | Freq: Once | INTRAMUSCULAR | Status: AC
  Administered 2014-12-27: 12.5 ug via INTRAVENOUS
  Filled 2014-12-27: qty 2

## 2014-12-27 MED ORDER — ONDANSETRON HCL 4 MG/2ML IJ SOLN
4.0000 mg | Freq: Once | INTRAMUSCULAR | Status: AC
Start: 2014-12-27 — End: 2014-12-27
  Administered 2014-12-27: 4 mg via INTRAVENOUS
  Filled 2014-12-27: qty 2

## 2014-12-27 MED ORDER — ONDANSETRON 4 MG PO TBDP
8.0000 mg | ORAL_TABLET | Freq: Once | ORAL | Status: AC
  Administered 2014-12-27: 8 mg via ORAL
  Filled 2014-12-27: qty 2

## 2014-12-27 MED ORDER — FENTANYL CITRATE (PF) 100 MCG/2ML IJ SOLN
50.0000 ug | Freq: Once | INTRAMUSCULAR | Status: AC
  Administered 2014-12-27: 50 ug via INTRAVENOUS
  Filled 2014-12-27: qty 2

## 2014-12-27 MED ORDER — IOHEXOL 300 MG/ML  SOLN
100.0000 mL | Freq: Once | INTRAMUSCULAR | Status: AC | PRN
  Administered 2014-12-27: 100 mL via INTRAVENOUS

## 2014-12-27 NOTE — Discharge Instructions (Signed)
As discussed, your evaluation today has been largely reassuring.  But, it is important that you monitor your condition carefully, and do not hesitate to return to the ED if you develop new, or concerning changes in your condition. ? ?Otherwise, please follow-up with your physician for appropriate ongoing care. ? ?

## 2014-12-27 NOTE — ED Notes (Addendum)
Per EMS, pt called out for abd pain in both lower quadrants now reports worse pain in the LLQ. Upon EMS arrival pt also reports CP with mild SOB and nausea. EMS gave 4mg  of zofran and 1 nitro for pain. Pt did not get Asprin due to allergy. PT alert x4. NAD at this time.

## 2014-12-27 NOTE — ED Provider Notes (Signed)
CSN: [id number]     Arrival date & time 12/27/14  1234 History   First MD Initiated Contact with Patient 12/27/14 1235     Chief Complaint  Patient presents with  . Abdominal Pain  . Chest Pain     (Consider location/radiation/quality/duration/timing/severity/associated sxs/prior Treatment) HPI Patient presents with concern of ongoing abdominal pain. Patient acknowledges a long history of left lower quadrant abdominal pain, and this has been largely unchanged. Typically, the pain is sore, severe, worse with defecation. Earlier today, without clear precipitant, the patient developed pain throughout the lower abdomen. Currently she denies any upper abdominal pain, any chest pain, though EMS states the patient may have had one episode of chest pain in route. Patient has had minimal relief with medication thus far, including nitroglycerin, Zofran. There is associated nausea, but no vomiting. Last bowel movement this morning, with no recent diarrhea.  Past Medical History  Diagnosis Date  . Anxiety   . Migraine   . DM (diabetes mellitus) ([place])   . GERD (gastroesophageal reflux disease)   . HLD (hyperlipidemia)   . HTN (hypertension)   . History of cerebral aneurysm repair     s/p coiling  . Peripheral neuropathy ([place])   . IBS (irritable bowel syndrome)   . Diverticulosis   . Adenomatous colon polyp 1970     carcinoma in situ  . History of hemorrhoids     with bleeding  . Hiatal hernia 02/03/05  . History of shingles   . UTI (lower urinary tract infection)   . DDD (degenerative disc disease)   . Iron deficiency   . Acute torn meniscus of knee   . Toe fracture, right     second toe  . Fundic gland polyposis of stomach   . Duodenitis     peptic, with gastric heterotopia  . Endometriosis     s/p hysterectomy  . MVP (mitral valve prolapse)   . Allergic rhinitis   . Varicose vein   . Osteopenia   . Hypercholesteremia   . Benign essential tremor syndrome   . Heart murmur    . Amaurosis fugax 08/07/2012  . colon ca dx'd 1970    surg only  . Diverticulitis   . Abdominal pain, chronic, left lower quadrant    Past Surgical History  Procedure Laterality Date  . Right hand decompressive fasciotomy  08/26/07    , dorsal and volar  . Carpal tunnel release  08/26/07  . Appendectomy    . Colon resection    . Abdominal hysterectomy    . Coronary angioplasty with stent placement    . Cerebral aneurysm repair    . Esophagogastroduodenoscopy  02/03/05    hiatal hernia, 6 benign gastric polyps  . Colonoscopy w/ biopsies and polypectomy  02/03/05    diverticulosis, 4 mm sessile polyps, internal and external hemorrhoids  . Colonoscopy  06/07/08    divertiulosis, internal hemorrhoids  . Tonsillectomy    . Flexible sigmoidoscopy    . Abdominal hysterectomy    . Cerebral aneurysm repair    . Cataract extraction    . Hand surgery Right   . Intraocular lens insertion     Family History  Problem Relation Age of Onset  . Ovarian cancer Mother   . Diabetes Brother   . Hypertension Brother   . Heart disease Father   . Kidney disease Father   . Heart disease Brother   . Diabetes Brother   . Heart disease Brother   . Microcephaly  Brother   . Diabetes    . Colon cancer    . Diabetes Paternal Grandmother    Social History  Substance Use Topics  . Smoking status: Never Smoker   . Smokeless tobacco: Never Used  . Alcohol Use: No   OB History    No data available     Review of Systems  Constitutional:       Per HPI, otherwise negative  HENT:       Per HPI, otherwise negative  Respiratory:       Per HPI, otherwise negative  Cardiovascular:       Per HPI, otherwise negative  Gastrointestinal: Positive for abdominal pain. Negative for vomiting.  Endocrine:       Negative aside from HPI  Genitourinary:       Neg aside from HPI   Musculoskeletal:       Per HPI, otherwise negative  Skin: Negative.   Neurological: Negative for syncope.      Allergies   Actos; Avandia; Fosamax; Ultram; Glimepiride; Januvia; Lipitor; Metformin and related; Prandin; Tradjenta; Zoloft; Aspirin; Bentyl; Ciprofloxacin; Keflex; and Morphine and related  Home Medications   Prior to Admission medications   Medication Sig Start Date End Date Taking? Authorizing Provider  acetaminophen (TYLENOL) 500 MG tablet Take 500 mg by mouth every 6 (six) hours as needed for pain.   Yes Historical Provider, MD  ALPRAZolam Duanne Moron) 0.5 MG tablet Take 0.5 mg by mouth at bedtime as needed for anxiety or sleep (sleep).    Yes Historical Provider, MD  amLODipine (NORVASC) 2.5 MG tablet Take 1 tablet (2.5 mg total) by mouth daily. 04/04/14  Yes Orson Eva, MD  atenolol (TENORMIN) 25 MG tablet Take 25 mg by mouth daily.   Yes Historical Provider, MD  Cholecalciferol (VITAMIN D-3) 1000 UNITS CAPS Take 1 capsule by mouth daily.   Yes Historical Provider, MD  Coenzyme Q10 100 MG capsule Take 100 mg by mouth daily.     Yes Historical Provider, MD  furosemide (LASIX) 20 MG tablet Take 20 mg by mouth as needed. Take 1 tablet 3 times a week as needed for swelling.   Yes Historical Provider, MD  Insulin Glargine (LANTUS SOLOSTAR) 100 UNIT/ML SOPN Inject 28 Units into the skin daily. Once daily each morning   Yes Historical Provider, MD  Multiple Vitamin (MULTIVITAMIN) tablet Take 1 tablet by mouth daily.     Yes Historical Provider, MD  pantoprazole (PROTONIX) 40 MG tablet TAKE 1 TABLET BY MOUTH ONCE DAILY 12/01/14  Yes Gatha Mayer, MD  trandolapril (MAVIK) 4 MG tablet Take 4 mg by mouth daily.     Yes Historical Provider, MD   BP 164/68 mmHg  Pulse 63  Temp(Src) 97.9 F (36.6 C) (Oral)  Resp 19  Ht 5\' 2"  (1.575 m)  Wt 172 lb (78.019 kg)  BMI 31.45 kg/m2  SpO2 98% Physical Exam  Constitutional: She is oriented to person, place, and time. She appears well-developed and well-nourished. No distress.  HENT:  Head: Normocephalic and atraumatic.  Eyes: Conjunctivae and EOM are normal.   Cardiovascular: Normal rate and regular rhythm.   Pulmonary/Chest: Effort normal and breath sounds normal. No stridor. No respiratory distress.  Abdominal: She exhibits no distension. There is tenderness in the right lower quadrant, suprapubic area and left lower quadrant. There is guarding. There is no rigidity.  Musculoskeletal: She exhibits no edema.  Neurological: She is alert and oriented to person, place, and time. No cranial nerve deficit.  Skin: Skin is warm and dry.  Psychiatric: She has a normal mood and affect.  Nursing note and vitals reviewed.   ED Course  Procedures (including critical care time) Labs Review Labs Reviewed  CBC WITH DIFFERENTIAL/PLATELET - Abnormal; Notable for the following:    WBC 12.8 (*)    MCV 77.1 (*)    MCH 24.3 (*)    RDW 16.1 (*)    Neutro Abs 8.5 (*)    All other components within normal limits  COMPREHENSIVE METABOLIC PANEL - Abnormal; Notable for the following:    Glucose, Bld 134 (*)    GFR calc non Af Amer 57 (*)    All other components within normal limits  LIPASE, BLOOD  URINALYSIS, ROUTINE W REFLEX MICROSCOPIC (NOT AT [facility])    Imaging Review Ct Abdomen Pelvis W Contrast  12/27/2014  CLINICAL DATA:  Lower pelvic pain and nausea for several days. EXAM: CT ABDOMEN AND PELVIS WITH CONTRAST TECHNIQUE: Multidetector CT imaging of the abdomen and pelvis was performed using the standard protocol following bolus administration of intravenous contrast. CONTRAST:  1104mL OMNIPAQUE IOHEXOL 300 MG/ML  SOLN COMPARISON:  CT 05/21/2013 FINDINGS: Lower chest: Lung bases are clear. Hepatobiliary: No focal hepatic lesion. No biliary duct dilatation. Gallbladder is normal. Common bile duct is normal. Pancreas: Pancreas is normal. No ductal dilatation. No pancreatic inflammation. Spleen: Normal spleen Adrenals/urinary tract: Adrenal glands and kidneys are normal. The ureters and bladder normal. Stomach/Bowel: Stomach and small bowel are normal. There  scattered diverticula throughout the colon and sigmoid colon. No acute inflammation. Vascular/Lymphatic: Abdominal aorta is normal caliber. There is no retroperitoneal or periportal lymphadenopathy. No pelvic lymphadenopathy. Reproductive: Post hysterectomy. Other: No free fluid. Musculoskeletal: Hemangioma in the L3 vertebral body. IMPRESSION: 1. No acute abdominal or pelvic findings. 2. Colonic diverticulosis without diverticulitis. Electronically Signed   By: Suzy Bouchard M.D.   On: 12/27/2014 15:31   I have personally reviewed and evaluated these images and lab results as part of my medical decision-making.   EKG Interpretation   Date/Time:  Saturday December 27 2014 12:41:05 EST Ventricular Rate:  61 PR Interval:  160 QRS Duration: 82 QT Interval:  423 QTC Calculation: 426 R Axis:   69 Text Interpretation:  Sinus rhythm Minimal ST elevation, inferior leads  Sinus rhythm ST-t wave abnormality No significant change since last  tracing Abnormal ekg Confirmed by Carmin Muskrat  MD [id number]) on  12/27/2014 12:57:46 PM     Cardiac 60 sinus normal  3:56 PM Patient in no distress, appears comfortable. I discussed all findings with the patient, her daughter, her husband. Patient has gastroenterologist with whom she'll follow-up in the next few days. MDM  Patient presents with abdominal pain.  Notably, the patient has a history of chronic abdominal pain, prior episodes of diverticulitis, but today describes new, right-sided in addition to left-sided abdominal pain. Here, she is awake, alert, afebrile, with no notable hematuria Abnormalities were CT scan is reassuring, labs generally reassuring aside from mild leukocytosis. Patient's pain improved here, and she was discharged in stable condition to follow-up with primary care and GI.   Carmin Muskrat, MD 12/27/14 [phone]

## 2014-12-29 ENCOUNTER — Telehealth: Payer: Self-pay | Admitting: Internal Medicine

## 2014-12-29 MED ORDER — GLYCOPYRROLATE 2 MG PO TABS: 2.0000 mg | ORAL_TABLET | Freq: Three times a day (TID) | ORAL | Status: DC

## 2014-12-29 NOTE — Telephone Encounter (Signed)
Patient was evlauated in the ER on 12/27/14 for lower abdominal pain.  She has a history of diverticulitis.  CT was negative.  She was discharged with instructions to follow up with GI.  She is offered an appt with Nicoletta Ba PA for Wed 11/30, but she declines.  She is the caregiver for her husband and won't be able to make that appt.  She asks for a refill of her robinul and she would like to check on help for her husband and call back with some available dates. I have sent her in 1 refill of robinul.  She will call back in the next day of so to set up an appt.

## 2015-01-08 ENCOUNTER — Ambulatory Visit: Payer: Medicare Other | Admitting: Adult Health

## 2015-01-12 ENCOUNTER — Ambulatory Visit: Payer: Medicare Other | Admitting: Physician Assistant

## 2015-01-27 ENCOUNTER — Other Ambulatory Visit: Payer: Self-pay | Admitting: Internal Medicine

## 2015-01-30 ENCOUNTER — Other Ambulatory Visit: Payer: Self-pay | Admitting: Internal Medicine

## 2015-02-25 ENCOUNTER — Other Ambulatory Visit: Payer: Self-pay | Admitting: Internal Medicine

## 2015-02-25 NOTE — Telephone Encounter (Signed)
Refill x 2 Needs rev also

## 2015-02-25 NOTE — Telephone Encounter (Signed)
Please advise Sir, thank you. 

## 2015-03-17 ENCOUNTER — Other Ambulatory Visit (HOSPITAL_COMMUNITY): Payer: Self-pay | Admitting: Interventional Radiology

## 2015-03-17 DIAGNOSIS — I729 Aneurysm of unspecified site: Secondary | ICD-10-CM

## 2015-03-24 ENCOUNTER — Ambulatory Visit (HOSPITAL_COMMUNITY): Payer: Medicare Other

## 2015-04-05 ENCOUNTER — Other Ambulatory Visit: Payer: Self-pay | Admitting: Internal Medicine

## 2015-04-07 ENCOUNTER — Other Ambulatory Visit (HOSPITAL_COMMUNITY): Payer: Self-pay | Admitting: Interventional Radiology

## 2015-04-07 DIAGNOSIS — I729 Aneurysm of unspecified site: Secondary | ICD-10-CM

## 2015-04-14 ENCOUNTER — Ambulatory Visit (HOSPITAL_COMMUNITY)
Admission: RE | Admit: 2015-04-14 | Discharge: 2015-04-14 | Disposition: A | Discharge status: Home / Self Care | Payer: Medicare Other | Source: Ambulatory Visit | Attending: Interventional Radiology | Admitting: Interventional Radiology

## 2015-04-14 DIAGNOSIS — I729 Aneurysm of unspecified site: Secondary | ICD-10-CM | POA: Insufficient documentation

## 2015-04-14 NOTE — Progress Notes (Signed)
VASCULAR LAB PRELIMINARY  PRELIMINARY  PRELIMINARY  PRELIMINARY  Carotid duplex has been completed.     Bilateral:  1-39% ICA stenosis.  Vertebral artery flow is antegrade.     Cambri Plourde, RVT, RDMS 04/14/2015, 2:25 PM

## 2015-04-17 ENCOUNTER — Encounter: Payer: Medicare Other | Admitting: Cardiology

## 2015-04-21 ENCOUNTER — Telehealth (HOSPITAL_COMMUNITY): Payer: Self-pay

## 2015-04-21 NOTE — Telephone Encounter (Signed)
Pt agreed to f/u in 6 months with US Carotid per Dr. Estanislado Pandy. AW

## 2015-04-25 ENCOUNTER — Emergency Department (HOSPITAL_COMMUNITY): Payer: Medicare Other

## 2015-04-25 ENCOUNTER — Emergency Department (HOSPITAL_COMMUNITY)
Admission: EM | Admit: 2015-04-25 | Discharge: 2015-04-25 | Disposition: A | Discharge status: Home / Self Care | Payer: Medicare Other | Attending: Emergency Medicine | Admitting: Emergency Medicine

## 2015-04-25 ENCOUNTER — Encounter (HOSPITAL_COMMUNITY): Payer: Self-pay | Admitting: Emergency Medicine

## 2015-04-25 DIAGNOSIS — R011 Cardiac murmur, unspecified: Secondary | ICD-10-CM | POA: Insufficient documentation

## 2015-04-25 DIAGNOSIS — R42 Dizziness and giddiness: Secondary | ICD-10-CM | POA: Diagnosis not present

## 2015-04-25 DIAGNOSIS — Z87828 Personal history of other (healed) physical injury and trauma: Secondary | ICD-10-CM | POA: Diagnosis not present

## 2015-04-25 DIAGNOSIS — Z8619 Personal history of other infectious and parasitic diseases: Secondary | ICD-10-CM | POA: Diagnosis not present

## 2015-04-25 DIAGNOSIS — G8929 Other chronic pain: Secondary | ICD-10-CM | POA: Insufficient documentation

## 2015-04-25 DIAGNOSIS — K219 Gastro-esophageal reflux disease without esophagitis: Secondary | ICD-10-CM | POA: Insufficient documentation

## 2015-04-25 DIAGNOSIS — I341 Nonrheumatic mitral (valve) prolapse: Secondary | ICD-10-CM | POA: Diagnosis not present

## 2015-04-25 DIAGNOSIS — E119 Type 2 diabetes mellitus without complications: Secondary | ICD-10-CM | POA: Insufficient documentation

## 2015-04-25 DIAGNOSIS — Z79899 Other long term (current) drug therapy: Secondary | ICD-10-CM | POA: Diagnosis not present

## 2015-04-25 DIAGNOSIS — Z862 Personal history of diseases of the blood and blood-forming organs and certain disorders involving the immune mechanism: Secondary | ICD-10-CM | POA: Insufficient documentation

## 2015-04-25 DIAGNOSIS — Z85038 Personal history of other malignant neoplasm of large intestine: Secondary | ICD-10-CM | POA: Diagnosis not present

## 2015-04-25 DIAGNOSIS — I10 Essential (primary) hypertension: Secondary | ICD-10-CM | POA: Diagnosis not present

## 2015-04-25 DIAGNOSIS — Z87440 Personal history of urinary (tract) infections: Secondary | ICD-10-CM | POA: Insufficient documentation

## 2015-04-25 DIAGNOSIS — R0600 Dyspnea, unspecified: Secondary | ICD-10-CM | POA: Diagnosis not present

## 2015-04-25 DIAGNOSIS — Z86010 Personal history of colonic polyps: Secondary | ICD-10-CM | POA: Insufficient documentation

## 2015-04-25 DIAGNOSIS — F329 Major depressive disorder, single episode, unspecified: Secondary | ICD-10-CM | POA: Diagnosis not present

## 2015-04-25 DIAGNOSIS — F419 Anxiety disorder, unspecified: Secondary | ICD-10-CM | POA: Insufficient documentation

## 2015-04-25 DIAGNOSIS — M8580 Other specified disorders of bone density and structure, unspecified site: Secondary | ICD-10-CM | POA: Diagnosis not present

## 2015-04-25 DIAGNOSIS — Z9861 Coronary angioplasty status: Secondary | ICD-10-CM | POA: Diagnosis not present

## 2015-04-25 DIAGNOSIS — R0602 Shortness of breath: Secondary | ICD-10-CM | POA: Diagnosis present

## 2015-04-25 DIAGNOSIS — Z8742 Personal history of other diseases of the female genital tract: Secondary | ICD-10-CM | POA: Insufficient documentation

## 2015-04-25 DIAGNOSIS — Z794 Long term (current) use of insulin: Secondary | ICD-10-CM | POA: Insufficient documentation

## 2015-04-25 DIAGNOSIS — Z8781 Personal history of (healed) traumatic fracture: Secondary | ICD-10-CM | POA: Diagnosis not present

## 2015-04-25 DIAGNOSIS — G43909 Migraine, unspecified, not intractable, without status migrainosus: Secondary | ICD-10-CM | POA: Insufficient documentation

## 2015-04-25 LAB — CBC
HCT: 38.3 % (ref 36.0–46.0)
Hemoglobin: 12.2 g/dL (ref 12.0–15.0)
MCH: 23.6 pg — ABNORMAL LOW (ref 26.0–34.0)
MCHC: 31.9 g/dL (ref 30.0–36.0)
MCV: 74.2 fL — AB (ref 78.0–100.0)
PLATELETS: 239 10*3/uL (ref 150–400)
RBC: 5.16 MIL/uL — AB (ref 3.87–5.11)
RDW: 15.7 % — ABNORMAL HIGH (ref 11.5–15.5)
WBC: 9.3 10*3/uL (ref 4.0–10.5)

## 2015-04-25 LAB — BASIC METABOLIC PANEL
Anion gap: 9 (ref 5–15)
BUN: 19 mg/dL (ref 6–20)
CHLORIDE: 107 mmol/L (ref 101–111)
CO2: 27 mmol/L (ref 22–32)
CREATININE: 0.92 mg/dL (ref 0.44–1.00)
Calcium: 9.8 mg/dL (ref 8.9–10.3)
GFR calc non Af Amer: 57 mL/min — ABNORMAL LOW (ref >60)
Glucose, Bld: 113 mg/dL — ABNORMAL HIGH (ref 65–99)
POTASSIUM: 4.5 mmol/L (ref 3.5–5.1)
SODIUM: 143 mmol/L (ref 135–145)

## 2015-04-25 LAB — URINALYSIS, ROUTINE W REFLEX MICROSCOPIC
Bilirubin Urine: NEGATIVE
GLUCOSE, UA: NEGATIVE mg/dL
Hgb urine dipstick: NEGATIVE
Ketones, ur: NEGATIVE mg/dL
LEUKOCYTES UA: NEGATIVE
NITRITE: NEGATIVE
PROTEIN: NEGATIVE mg/dL
Specific Gravity, Urine: 1.016 (ref 1.005–1.030)
pH: 6 (ref 5.0–8.0)

## 2015-04-25 LAB — TROPONIN I

## 2015-04-25 LAB — D-DIMER, QUANTITATIVE (NOT AT ARMC): D DIMER QUANT: 0.53 ug{FEU}/mL — AB (ref 0.00–0.50)

## 2015-04-25 LAB — CBG MONITORING, ED: GLUCOSE-CAPILLARY: 108 mg/dL — AB (ref 65–99)

## 2015-04-25 MED ORDER — IOPAMIDOL (ISOVUE-370) INJECTION 76%
100.0000 mL | Freq: Once | INTRAVENOUS | Status: AC | PRN
  Administered 2015-04-25: 100 mL via INTRAVENOUS

## 2015-04-25 NOTE — Discharge Instructions (Signed)
° °  You have a descending aortic aneurysm that measures 4.6 cm. Call your doctor and tell your cardiologist about this so that you can have follow-up  Dilated ascending thoracic aorta to 4.6 cm. Ascending thoracic aortic aneurysm. Recommend semi-annual imaging followup by CTA or MRA and referral to cardiothoracic surgery if not already obtained. Shortness of Breath Shortness of breath means you have trouble breathing. It could also mean that you have a medical problem. You should get immediate medical care for shortness of breath. CAUSES   Not enough oxygen in the air such as with high altitudes or a smoke-filled room.  Certain lung diseases, infections, or problems.  Heart disease or conditions, such as angina or heart failure.  Low red blood cells (anemia).  Poor physical fitness, which can cause shortness of breath when you exercise.  Chest or back injuries or stiffness.  Being overweight.  Smoking.  Anxiety, which can make you feel like you are not getting enough air. DIAGNOSIS  Serious medical problems can often be found during your physical exam. Tests may also be done to determine why you are having shortness of breath. Tests may include:  Chest X-rays.  Lung function tests.  Blood tests.  An electrocardiogram (ECG).  An ambulatory electrocardiogram. An ambulatory ECG records your heartbeat patterns over a 24-hour period.  Exercise testing.  A transthoracic echocardiogram (TTE). During echocardiography, sound waves are used to evaluate how blood flows through your heart.  A transesophageal echocardiogram (TEE).  Imaging scans. Your health care provider may not be able to find a cause for your shortness of breath after your exam. In this case, it is important to have a follow-up exam with your health care provider as directed.  TREATMENT  Treatment for shortness of breath depends on the cause of your symptoms and can vary greatly. HOME CARE INSTRUCTIONS   Do  not smoke. Smoking is a common cause of shortness of breath. If you smoke, ask for help to quit.  Avoid being around chemicals or things that may bother your breathing, such as paint fumes and dust.  Rest as needed. Slowly resume your usual activities.  If medicines were prescribed, take them as directed for the full length of time directed. This includes oxygen and any inhaled medicines.  Keep all follow-up appointments as directed by your health care provider. SEEK MEDICAL CARE IF:   Your condition does not improve in the time expected.  You have a hard time doing your normal activities even with rest.  You have any new symptoms. SEEK IMMEDIATE MEDICAL CARE IF:   Your shortness of breath gets worse.  You feel light-headed, faint, or develop a cough not controlled with medicines.  You start coughing up blood.  You have pain with breathing.  You have chest pain or pain in your arms, shoulders, or abdomen.  You have a fever.  You are unable to walk up stairs or exercise the way you normally do. MAKE SURE YOU:  Understand these instructions.  Will watch your condition.  Will get help right away if you are not doing well or get worse.   This information is not intended to replace advice given to you by your health care provider. Make sure you discuss any questions you have with your health care provider.   Document Released: 10/12/2000 Document Revised: 01/22/2013 Document Reviewed: 04/04/2011 Elsevier Interactive Patient Education [employer].

## 2015-04-25 NOTE — ED Notes (Signed)
Allen at bedside. 

## 2015-04-25 NOTE — ED Notes (Signed)
Patient presents for SOB, dizziness, decreased urine output. Denies N/V/D, fever/chills, cough.

## 2015-04-25 NOTE — ED Provider Notes (Signed)
CSN: [id number]     Arrival date & time 04/25/15  1358 History   First MD Initiated Contact with Patient 04/25/15 1531     Chief Complaint  Patient presents with  . Shortness of Breath  . Dizziness     (Consider location/radiation/quality/duration/timing/severity/associated sxs/prior Treatment) HPI Comments: Patient here complaining of increased dyspnea times several days that is worse in the early morning hours. Has a long-standing history of anxiety which she states has been getting worse. Has had some associated chest tightness without diaphoresis or nausea. Also endorses increased depression due to the recent deaths of multiple friends in 1 week. Has had decreased urinary output but has had no vomiting or diarrhea. Denies any abdominal discomfort. No black or bloody stools. Patient concerned that she may have sinusitis and treated with over-the-counter medications which has resolved her symptoms. Denies any headache or neck pain photophobia. Saw her Dr. weeks ago for increased anxiety was not placed on medications and states that he spent over 30 minutes crying on the Dr. shoulder due to her increased stress.  Patient is a 82 y.o. female presenting with shortness of breath and dizziness. The history is provided by the patient, a relative and the spouse.  Shortness of Breath Dizziness Associated symptoms: shortness of breath     Past Medical History  Diagnosis Date  . Anxiety   . Migraine   . DM (diabetes mellitus) ([place])   . GERD (gastroesophageal reflux disease)   . HLD (hyperlipidemia)   . HTN (hypertension)   . History of cerebral aneurysm repair     s/p coiling  . Peripheral neuropathy ([place])   . IBS (irritable bowel syndrome)   . Diverticulosis   . Adenomatous colon polyp 1970     carcinoma in situ  . History of hemorrhoids     with bleeding  . Hiatal hernia 02/03/05  . History of shingles   . UTI (lower urinary tract infection)   . DDD (degenerative disc disease)   .  Iron deficiency   . Acute torn meniscus of knee   . Toe fracture, right     second toe  . Fundic gland polyposis of stomach   . Duodenitis     peptic, with gastric heterotopia  . Endometriosis     s/p hysterectomy  . MVP (mitral valve prolapse)   . Allergic rhinitis   . Varicose vein   . Osteopenia   . Hypercholesteremia   . Benign essential tremor syndrome   . Heart murmur   . Amaurosis fugax 08/07/2012  . colon ca dx'd 1970    surg only  . Diverticulitis   . Abdominal pain, chronic, left lower quadrant    Past Surgical History  Procedure Laterality Date  . Right hand decompressive fasciotomy  08/26/07    , dorsal and volar  . Carpal tunnel release  08/26/07  . Appendectomy    . Colon resection    . Abdominal hysterectomy    . Coronary angioplasty with stent placement    . Cerebral aneurysm repair    . Esophagogastroduodenoscopy  02/03/05    hiatal hernia, 6 benign gastric polyps  . Colonoscopy w/ biopsies and polypectomy  02/03/05    diverticulosis, 4 mm sessile polyps, internal and external hemorrhoids  . Colonoscopy  06/07/08    divertiulosis, internal hemorrhoids  . Tonsillectomy    . Flexible sigmoidoscopy    . Abdominal hysterectomy    . Cerebral aneurysm repair    . Cataract extraction    .  Hand surgery Right   . Intraocular lens insertion     Family History  Problem Relation Age of Onset  . Ovarian cancer Mother   . Diabetes Brother   . Hypertension Brother   . Heart disease Father   . Kidney disease Father   . Heart disease Brother   . Diabetes Brother   . Heart disease Brother   . Microcephaly Brother   . Diabetes    . Colon cancer    . Diabetes Paternal Grandmother    Social History  Substance Use Topics  . Smoking status: Never Smoker   . Smokeless tobacco: Never Used  . Alcohol Use: No   OB History    No data available     Review of Systems  Respiratory: Positive for shortness of breath.   Neurological: Positive for dizziness.  All other  systems reviewed and are negative.     Allergies  Actos; Avandia; Fosamax; Ultram; Glimepiride; Januvia; Lipitor; Metformin and related; Prandin; Tradjenta; Zoloft; Aspirin; Bentyl; Ciprofloxacin; Keflex; and Morphine and related  Home Medications   Prior to Admission medications   Medication Sig Start Date End Date Taking? Authorizing Provider  acetaminophen (TYLENOL) 500 MG tablet Take 500 mg by mouth every 6 (six) hours as needed for mild pain, moderate pain, fever or headache.   Yes Historical Provider, MD  ALPRAZolam Duanne Moron) 0.5 MG tablet Take 0.5 mg by mouth at bedtime.    Yes Historical Provider, MD  atenolol (TENORMIN) 25 MG tablet Take 25 mg by mouth daily.   Yes Historical Provider, MD  Cholecalciferol (VITAMIN D-3) 1000 UNITS CAPS Take 3 capsules by mouth at bedtime.    Yes Historical Provider, MD  Coenzyme Q10 100 MG capsule Take 100 mg by mouth daily.     Yes Historical Provider, MD  furosemide (LASIX) 20 MG tablet Take 20 mg by mouth daily as needed (for swelling).    Yes Historical Provider, MD  glycopyrrolate (ROBINUL) 2 MG tablet TAKE 1 TABLET BY MOUTH 3 TIMES DAILY *PT NEEDS TO MAKE APPT** Patient taking differently: TAKE 1 TABLET BY MOUTH 3 TIMES DAILY as needed for DVT 02/25/15  Yes Gatha Mayer, MD  HYDROcodone-acetaminophen (NORCO/VICODIN) 5-325 MG tablet Take 1 tablet by mouth every 6 (six) hours as needed for severe pain. 12/27/14  Yes Carmin Muskrat, MD  Insulin Glargine (LANTUS SOLOSTAR) 100 UNIT/ML SOPN Inject 28 Units into the skin daily with breakfast.    Yes Historical Provider, MD  loratadine (CLARITIN) 10 MG tablet Take 10 mg by mouth daily as needed for allergies.   Yes Historical Provider, MD  Multiple Vitamin (MULTIVITAMIN) tablet Take 1 tablet by mouth daily.     Yes Historical Provider, MD  pantoprazole (PROTONIX) 40 MG tablet TAKE 1 TABLET BY MOUTH ONCE DAILY 04/06/15  Yes Gatha Mayer, MD  trandolapril (MAVIK) 4 MG tablet Take 4 mg by mouth daily.      Yes Historical Provider, MD  amLODipine (NORVASC) 2.5 MG tablet Take 1 tablet (2.5 mg total) by mouth daily. Patient not taking: Reported on 04/25/2015 04/04/14   Orson Eva, MD  ondansetron (ZOFRAN-ODT) 8 MG disintegrating tablet Take 1 tablet (8 mg total) by mouth once. Patient not taking: Reported on 04/25/2015 12/27/14   Carmin Muskrat, MD   BP 141/68 mmHg  Pulse 57  Temp(Src) 98.2 F (36.8 C) (Oral)  Resp 16  SpO2 99% Physical Exam  Constitutional: She is oriented to person, place, and time. She appears well-developed and well-nourished.  Non-toxic appearance. No distress.  HENT:  Head: Normocephalic and atraumatic.  Eyes: Conjunctivae, EOM and lids are normal. Pupils are equal, round, and reactive to light.  Neck: Normal range of motion. Neck supple. No tracheal deviation present. No thyroid mass present.  Cardiovascular: Normal rate, regular rhythm and normal heart sounds.  Exam reveals no gallop.   No murmur heard. Pulmonary/Chest: Effort normal and breath sounds normal. No stridor. No respiratory distress. She has no decreased breath sounds. She has no wheezes. She has no rhonchi. She has no rales.  Abdominal: Soft. Normal appearance and bowel sounds are normal. She exhibits no distension. There is no tenderness. There is no rebound and no CVA tenderness.  Musculoskeletal: Normal range of motion. She exhibits no edema or tenderness.  Neurological: She is alert and oriented to person, place, and time. She has normal strength. No cranial nerve deficit or sensory deficit. GCS eye subscore is 4. GCS verbal subscore is 5. GCS motor subscore is 6.  Skin: Skin is warm and dry. No abrasion and no rash noted.  Psychiatric: Her speech is normal. She is withdrawn. She exhibits a depressed mood.  Nursing note and vitals reviewed.   ED Course  Procedures (including critical care time) Labs Review Labs Reviewed  CBG MONITORING, ED - Abnormal; Notable for the following:    Glucose-Capillary  108 (*)    All other components within normal limits  BASIC METABOLIC PANEL  CBC  URINALYSIS, ROUTINE W REFLEX MICROSCOPIC (NOT AT [facility])  TROPONIN I  D-DIMER, QUANTITATIVE (NOT AT [facility])    Imaging Review Dg Chest 2 View  04/25/2015  CLINICAL DATA:  Shortness of breath, dizziness EXAM: CHEST  2 VIEW COMPARISON:  04/01/2014 FINDINGS: Borderline cardiomegaly. No acute infiltrate or pleural effusion. No pulmonary edema. Mild elevation of the right hemidiaphragm. Mild degenerative changes mid thoracic spine. IMPRESSION: Borderline cardiomegaly.  No active disease. Electronically Signed   By: Lahoma Crocker M.D.   On: 04/25/2015 15:20   I have personally reviewed and evaluated these images and lab results as part of my medical decision-making.   EKG Interpretation   Date/Time:  Saturday April 25 2015 14:30:09 EDT Ventricular Rate:  55 PR Interval:  171 QRS Duration: 108 QT Interval:  428 QTC Calculation: 409 R Axis:   71 Text Interpretation:  Sinus rhythm Minimal ST elevation, inferior leads No  significant change since last tracing Confirmed by Jojo Pehl  MD, Ysabella Babiarz  ([id number]) on 04/25/2015 3:41:13 PM      MDM   Final diagnoses:  None   Patient's chest CT results discussed with her and her family and she will follow-up with her doctor. Suspect her symptoms are coming from anxiety. Do not think that this represents ACS. Return precautions given     Lacretia Leigh, MD 04/25/15 [phone]

## 2015-04-25 NOTE — ED Notes (Signed)
Pt reports hx of seasonal allergies; onset seasonal allergies 04/19/15 with complaint of sinusitis and headache; reports taking antihistamine and tylenol for symptoms. Pt reports resolving sinusitis but continued headache with new onset SOB and weakness this morning with awakening at 0400. Pt continues to report decreased urine output related to dehydration.

## 2015-04-28 ENCOUNTER — Encounter: Payer: Self-pay | Admitting: Cardiology

## 2015-04-28 ENCOUNTER — Ambulatory Visit (INDEPENDENT_AMBULATORY_CARE_PROVIDER_SITE_OTHER): Payer: Medicare Other | Admitting: Cardiology

## 2015-04-28 VITALS — BP 150/58 | HR 64 | Ht 62.0 in | Wt 173.8 lb

## 2015-04-28 DIAGNOSIS — R079 Chest pain, unspecified: Secondary | ICD-10-CM

## 2015-04-28 DIAGNOSIS — I719 Aortic aneurysm of unspecified site, without rupture: Secondary | ICD-10-CM | POA: Diagnosis not present

## 2015-04-28 DIAGNOSIS — R0602 Shortness of breath: Secondary | ICD-10-CM

## 2015-04-28 MED ORDER — ATENOLOL 50 MG PO TABS: 50.0000 mg | ORAL_TABLET | Freq: Every day | ORAL | Status: DC

## 2015-04-28 NOTE — Patient Instructions (Signed)
Medication Instructions:  Your physician has recommended you make the following change in your medication:  1) INCREASE TENORMIN to 50 mg daily  Labwork: TODAY: TSH, BNP  Testing/Procedures: Your physician has requested that you have an echocardiogram. Echocardiography is a painless test that uses sound waves to create images of your heart. It provides your doctor with information about the size and shape of your heart and how well your heart's chambers and valves are working. This procedure takes approximately one hour. There are no restrictions for this procedure.   Your physician has requested that you have a lexiscan myoview. For further information please visit [email or web address]. Please follow instruction sheet, as given.  Follow-Up: You have been referred to Dr. Cyndia Bent for dilated aortic aneurysm.  Your physician recommends that you schedule a follow-up appointment in: 2 weeks with a PA or NP.  Your physician wants you to follow-up in: 1 year with Dr. Radford Pax. You will receive a reminder letter in the mail two months in advance. If you don't receive a letter, please call our office to schedule the follow-up appointment.   Any Other Special Instructions Will Be Listed Below (If Applicable).     If you need a refill on your cardiac medications before your next appointment, please call your pharmacy.

## 2015-04-28 NOTE — Progress Notes (Signed)
Cardiology Office Note   Date:  04/28/2015   ID:  [name], DOB 1932-09-27, MRN [id number]  PCP:  Gara Kroner, MD    Chief Complaint  Patient presents with  . Chest Pain  . Shortness of Breath      History of Present Illness: [name] is a 82 y.o. female who presents for evaluation of SOB and thoracic aneurysm.  She has a history of HTN, dyslipdiemia, MVP, PUD with hiatal hernia and Type II DM.  She also has a history of cerebral aneurysm s/p coiling.  She has been having a lot of problems with weakness and SOB and was seen in the walkin clinic and sent to the ER.  Troponin was negative x 1.  D-Dimer was elevated and Chest CT angio showed a moderately dilated ascending thoracic aortic aneurysm measuring 4.6cm.  She has been having some episodes of chest pain that is mid sternal and described as a heaviness that comes and goes.  It occurs more at night when laying down. She is very weak in the am and now uses a walker due to severe weakness.  She has a history of syncope a year ago from presumed orthostasis.  She has had severe SOB and DOE with any exertion and is worse in the am when she first gets up.  She denies any orthopnea or PND.  She has chronic LE edema and peripheral neuropathy.  The edema started after she had the coiling of the cerebral aneurysm.      Past Medical History  Diagnosis Date  . Anxiety   . Migraine   . DM (diabetes mellitus) ([place])   . GERD (gastroesophageal reflux disease)   . HLD (hyperlipidemia)   . HTN (hypertension)   . History of cerebral aneurysm repair     s/p coiling  . Peripheral neuropathy ([place])   . IBS (irritable bowel syndrome)   . Diverticulosis   . Adenomatous colon polyp 1970     carcinoma in situ  . History of hemorrhoids     with bleeding  . Hiatal hernia 02/03/05  . History of shingles   . UTI (lower urinary tract infection)   . DDD (degenerative disc disease)   . Iron deficiency   . Acute torn  meniscus of knee   . Toe fracture, right     second toe  . Fundic gland polyposis of stomach   . Duodenitis     peptic, with gastric heterotopia  . Endometriosis     s/p hysterectomy  . MVP (mitral valve prolapse)   . Allergic rhinitis   . Varicose vein   . Osteopenia   . Hypercholesteremia   . Benign essential tremor syndrome   . Heart murmur   . Amaurosis fugax 08/07/2012  . colon ca dx'd 1970    surg only  . Diverticulitis   . Abdominal pain, chronic, left lower quadrant     Past Surgical History  Procedure Laterality Date  . Right hand decompressive fasciotomy  08/26/07    , dorsal and volar  . Carpal tunnel release  08/26/07  . Appendectomy    . Colon resection    . Abdominal hysterectomy    . Coronary angioplasty with stent placement    . Cerebral aneurysm repair    . Esophagogastroduodenoscopy  02/03/05    hiatal hernia, 6 benign gastric polyps  . Colonoscopy w/ biopsies  and polypectomy  02/03/05    diverticulosis, 4 mm sessile polyps, internal and external hemorrhoids  . Colonoscopy  06/07/08    divertiulosis, internal hemorrhoids  . Tonsillectomy    . Flexible sigmoidoscopy    . Abdominal hysterectomy    . Cerebral aneurysm repair    . Cataract extraction    . Hand surgery Right   . Intraocular lens insertion       Current Outpatient Prescriptions  Medication Sig Dispense Refill  . acetaminophen (TYLENOL) 500 MG tablet Take 500 mg by mouth every 6 (six) hours as needed for mild pain, moderate pain, fever or headache.    . ALPRAZolam (XANAX) 0.5 MG tablet Take 0.5 mg by mouth at bedtime.     Marland Kitchen amLODipine (NORVASC) 2.5 MG tablet Take 1 tablet (2.5 mg total) by mouth daily. 30 tablet 1  . atenolol (TENORMIN) 25 MG tablet Take 25 mg by mouth daily.    . Cholecalciferol (VITAMIN D-3) 1000 UNITS CAPS Take 3 capsules by mouth at bedtime.     . Coenzyme Q10 100 MG capsule Take 100 mg by mouth daily.      . furosemide (LASIX) 20 MG tablet Take 20 mg by mouth daily as  needed (for swelling).     Marland Kitchen glycopyrrolate (ROBINUL) 2 MG tablet TAKE 1 TABLET BY MOUTH 3 TIMES DAILY *PT NEEDS TO MAKE APPT** (Patient taking differently: TAKE 1 TABLET BY MOUTH 3 TIMES DAILY as needed for DVT) 90 tablet 1  . HYDROcodone-acetaminophen (NORCO/VICODIN) 5-325 MG tablet Take 1 tablet by mouth every 6 (six) hours as needed for severe pain. 15 tablet 0  . Insulin Glargine (LANTUS SOLOSTAR) 100 UNIT/ML SOPN Inject 28 Units into the skin daily with breakfast.     . loratadine (CLARITIN) 10 MG tablet Take 10 mg by mouth daily as needed for allergies.    . Multiple Vitamin (MULTIVITAMIN) tablet Take 1 tablet by mouth daily.      . ondansetron (ZOFRAN-ODT) 8 MG disintegrating tablet Take 1 tablet (8 mg total) by mouth once. 20 tablet 0  . pantoprazole (PROTONIX) 40 MG tablet TAKE 1 TABLET BY MOUTH ONCE DAILY 30 tablet 1  . trandolapril (MAVIK) 4 MG tablet Take 4 mg by mouth daily.       No current facility-administered medications for this visit.    Allergies:   Actos; Avandia; Fosamax; Ultram; Glimepiride; Januvia; Lipitor; Metformin and related; Prandin; Tradjenta; Zoloft; Aspirin; Bentyl; Ciprofloxacin; Keflex; and Morphine and related    Social History:  The patient  reports that she has never smoked. She has never used smokeless tobacco. She reports that she does not drink alcohol or use illicit drugs.   Family History:  The patient's family history includes Diabetes in her brother, brother, and paternal grandmother; Heart disease in her brother, brother, and father; Hypertension in her brother; Kidney disease in her father; Microcephaly in her brother; Ovarian cancer in her mother.    ROS:  Please see the history of present illness.   Otherwise, review of systems are positive for none.   All other systems are reviewed and negative.    PHYSICAL EXAM: VS:  BP 150/58 mmHg  Pulse 64  Ht 5\' 2"  (1.575 m)  Wt 173 lb 12.8 oz (78.835 kg)  BMI 31.78 kg/m2 , BMI Body mass index is  31.78 kg/(m^2). GEN: Well nourished, well developed, in no acute distress HEENT: normal Neck: no JVD, carotid bruits, or masses Cardiac: RRR; no murmurs, rubs, or gallops,no edema  Respiratory:  clear to auscultation bilaterally, normal work of breathing GI: soft, nontender, nondistended, + BS MS: no deformity or atrophy Skin: warm and dry, no rash Neuro:  Strength and sensation are intact Psych: euthymic mood, full affect   EKG:  EKG is ordered today. The ekg ordered today demonstrates NSR at 64bpm with no ST changes   Recent Labs: 12/27/2014: ALT 18 04/25/2015: BUN 19; Creatinine, Ser 0.92; Hemoglobin 12.2; Platelets 239; Potassium 4.5; Sodium 143    Lipid Panel    Component Value Date/Time   CHOL  08/27/2007 0354    151        ATP III CLASSIFICATION:  <200     mg/dL   Desirable  200-239  mg/dL   Borderline High  >=240    mg/dL   High   TRIG 82 08/27/2007 0354   HDL 24* 08/27/2007 0354   CHOLHDL 6.3 08/27/2007 0354   VLDL 16 08/27/2007 0354   LDLCALC * 08/27/2007 0354    111        Total Cholesterol/HDL:CHD Risk Coronary Heart Disease Risk Table                     Men   Women  1/2 Average Risk   3.4   3.3      Wt Readings from Last 3 Encounters:  04/28/15 173 lb 12.8 oz (78.835 kg)  12/27/14 172 lb (78.019 kg)  04/01/14 177 lb 12.8 oz (80.65 kg)        ASSESSMENT AND PLAN:  1.  Chest pain with exertional weakness but his only occurs first thing in the am.  She does have CRF including Type II DM, HTN, dyslipidemia and PVD with cerebral and thoracic aneurysms.  I will get a Lexiscan myoview to rule out ischemia.   2.  DOE which again is worse in the am.  I will check a 2D echo to assess LVF.  Check TSH and BNP. 3.  HTN - controlled on current medical regimen.  4.  H/O MVP - repeat echo to assess. 5.  Moderately dilated ascending thoracic aneursym at 4.6cm.  She cannot take ASA due to history of PUD with GI bleeding.  She is statin intolerant due to leg  cramps in the past.  She has tried Lipitor in the past and had severe leg cramps.  I will get her most recent FLP from her PCP.  I am going to refer her to Dr. Cyndia Bent to follow along.  She needs aggressive BP control.  Will continue BB.  I will increase atenolol to 50mg  daily.   6.  Cerebral aneurysms s/p coiling of 1   Current medicines are reviewed at length with the patient today.  The patient does not have concerns regarding medicines.  The following changes have been made:  no change  Labs/ tests ordered today: See above Assessment and Plan No orders of the defined types were placed in this encounter.     Disposition:   FU with me in 1 year  And 2 weeks with PA Signed, Sueanne Margarita, MD  04/28/2015 2:14 PM    [facility] [address], [place], [place]  [address] Phone: [phone]; Fax: [phone]

## 2015-04-29 LAB — TSH: TSH: 1.18 m[IU]/L

## 2015-04-29 LAB — BRAIN NATRIURETIC PEPTIDE: BRAIN NATRIURETIC PEPTIDE: 55.6 pg/mL (ref <100)

## 2015-04-30 ENCOUNTER — Institutional Professional Consult (permissible substitution) (INDEPENDENT_AMBULATORY_CARE_PROVIDER_SITE_OTHER): Payer: Medicare Other | Admitting: Surgery

## 2015-04-30 ENCOUNTER — Encounter: Payer: Self-pay | Admitting: Surgery

## 2015-04-30 VITALS — BP 155/69 | HR 61 | Resp 16 | Ht 62.0 in | Wt 173.8 lb

## 2015-04-30 DIAGNOSIS — I712 Thoracic aortic aneurysm, without rupture, unspecified: Secondary | ICD-10-CM

## 2015-04-30 NOTE — Progress Notes (Signed)
Cardiothoracic Surgery Consultation  PCP is Gara Kroner, MD Referring Provider is Sueanne Margarita, MD  Chief Complaint  Patient presents with  . Thoracic Aortic Aneurysm    per CTA CHEST 04/25/15    HPI:  The patient is an 82 year old woman with DM, hypertension, hyperlipidemia and cerebral aneurysm s/p coiling who was recently seen by Dr. Radford Pax for evaluation of shortness of breath and an ascending aortic aneurysm after presenting to the ER and having a CT scan. She feels like her shortness of breath was related to a URI with sinus drainage and says that it has resolved as has her shortness of breath. Her CT scan showed a 4.6 cm fusiform ascending aortic aneurysm.   Past Medical History  Diagnosis Date  . Anxiety   . Migraine   . DM (diabetes mellitus) ([place])   . GERD (gastroesophageal reflux disease)   . HLD (hyperlipidemia)   . HTN (hypertension)   . History of cerebral aneurysm repair     s/p coiling  . Peripheral neuropathy ([place])   . IBS (irritable bowel syndrome)   . Diverticulosis   . Adenomatous colon polyp 1970     carcinoma in situ  . History of hemorrhoids     with bleeding  . Hiatal hernia 02/03/05  . History of shingles   . UTI (lower urinary tract infection)   . DDD (degenerative disc disease)   . Iron deficiency   . Acute torn meniscus of knee   . Toe fracture, right     second toe  . Fundic gland polyposis of stomach   . Duodenitis     peptic, with gastric heterotopia  . Endometriosis     s/p hysterectomy  . MVP (mitral valve prolapse)   . Allergic rhinitis   . Varicose vein   . Osteopenia   . Hypercholesteremia   . Benign essential tremor syndrome   . Heart murmur   . Amaurosis fugax 08/07/2012  . colon ca dx'd 1970    surg only  . Diverticulitis   . Abdominal pain, chronic, left lower quadrant     Past Surgical History  Procedure Laterality Date  . Right hand decompressive fasciotomy  08/26/07    , dorsal and volar  . Carpal tunnel  release  08/26/07  . Appendectomy    . Colon resection    . Abdominal hysterectomy    . Coronary angioplasty with stent placement    . Cerebral aneurysm repair    . Esophagogastroduodenoscopy  02/03/05    hiatal hernia, 6 benign gastric polyps  . Colonoscopy w/ biopsies and polypectomy  02/03/05    diverticulosis, 4 mm sessile polyps, internal and external hemorrhoids  . Colonoscopy  06/07/08    divertiulosis, internal hemorrhoids  . Tonsillectomy    . Flexible sigmoidoscopy    . Abdominal hysterectomy    . Cerebral aneurysm repair    . Cataract extraction    . Hand surgery Right   . Intraocular lens insertion      Family History  Problem Relation Age of Onset  . Ovarian cancer Mother   . Diabetes Brother   . Hypertension Brother   . Heart disease Father   . Kidney disease Father   . Heart disease Brother   . Diabetes Brother   . Heart disease Brother   . Microcephaly Brother   . Diabetes    . Colon cancer    . Diabetes Paternal Grandmother     Social  History Social History  Substance Use Topics  . Smoking status: Never Smoker   . Smokeless tobacco: Never Used  . Alcohol Use: No    Current Outpatient Prescriptions  Medication Sig Dispense Refill  . acetaminophen (TYLENOL) 500 MG tablet Take 500 mg by mouth every 6 (six) hours as needed for mild pain, moderate pain, fever or headache.    . ALPRAZolam (XANAX) 0.5 MG tablet Take 0.5 mg by mouth at bedtime.     Marland Kitchen atenolol (TENORMIN) 50 MG tablet Take 1 tablet (50 mg total) by mouth daily. 30 tablet 11  . Cholecalciferol (VITAMIN D-3) 1000 UNITS CAPS Take 3 capsules by mouth at bedtime.     . Coenzyme Q10 100 MG capsule Take 100 mg by mouth daily.      . furosemide (LASIX) 20 MG tablet Take 20 mg by mouth daily as needed (for swelling).     Marland Kitchen glycopyrrolate (ROBINUL) 2 MG tablet TAKE 1 TABLET BY MOUTH 3 TIMES DAILY *PT NEEDS TO MAKE APPT** (Patient taking differently: TAKE 1 TABLET BY MOUTH 3 TIMES DAILY as needed for DVT) 90  tablet 1  . HYDROcodone-acetaminophen (NORCO/VICODIN) 5-325 MG tablet Take 1 tablet by mouth every 6 (six) hours as needed for severe pain. 15 tablet 0  . Insulin Glargine (LANTUS SOLOSTAR) 100 UNIT/ML SOPN Inject 28 Units into the skin daily with breakfast.     . loratadine (CLARITIN) 10 MG tablet Take 10 mg by mouth daily as needed for allergies.    . Multiple Vitamin (MULTIVITAMIN) tablet Take 1 tablet by mouth daily.      . ondansetron (ZOFRAN-ODT) 8 MG disintegrating tablet Take 1 tablet (8 mg total) by mouth once. 20 tablet 0  . pantoprazole (PROTONIX) 40 MG tablet TAKE 1 TABLET BY MOUTH ONCE DAILY 30 tablet 1  . trandolapril (MAVIK) 4 MG tablet Take 4 mg by mouth daily.       No current facility-administered medications for this visit.    Allergies  Allergen Reactions  . Actos [Pioglitazone]     Chronic  Pedal edema:contraindication  . Avandia [Rosiglitazone]     Chronic pedal edema:contraindication  . Fosamax [Alendronate Sodium]     unknown  . Ultram [Tramadol] Shortness Of Breath and Palpitations  . Glimepiride     hypersensitivity  . Januvia [Sitagliptin]     hypersensitivity  . Lipitor [Atorvastatin]     Causes muscle pain and swelling  . Metformin And Related     gastritis  . Prandin [Repaglinide]     Abdominal pain: side effects  . Tradjenta [Linagliptin]     GI problems, hypersensitivity  . Zoloft [Sertraline Hcl]     Jittery or zombie like  . Aspirin Other (See Comments)    Bleeding ulcers   . Bentyl [Dicyclomine Hcl]     Came close to passing out. weak  . Ciprofloxacin Nausea And Vomiting    Severe stomach upset  . Keflex [Cephalexin]   . Morphine And Related Other (See Comments)    Body Spasms    Review of Systems  Constitutional: Positive for fatigue.  HENT: Positive for dental problem and postnasal drip.   Eyes:       Wears glasses  Respiratory: Positive for shortness of breath and wheezing.   Cardiovascular: Positive for chest pain,  palpitations and leg swelling.  Gastrointestinal:       Reflux and hiatal hernia  Endocrine: Negative.   Genitourinary: Positive for frequency.  Musculoskeletal: Positive for arthralgias and gait problem.  Skin: Negative.   Allergic/Immunologic: Negative.   Neurological: Positive for dizziness.  Hematological: Negative.   Psychiatric/Behavioral: The patient is nervous/anxious.     BP 155/69 mmHg  Pulse 61  Resp 16  Ht 5\' 2"  (1.575 m)  Wt 173 lb 12.8 oz (78.835 kg)  BMI 31.78 kg/m2  SpO2 96% Physical Exam  Constitutional: She is oriented to person, place, and time. She appears well-developed and well-nourished. No distress.  HENT:  Head: Normocephalic and atraumatic.  Mouth/Throat: Oropharynx is clear and moist.  Eyes: EOM are normal. Pupils are equal, round, and reactive to light.  Neck: Normal range of motion. Neck supple. No JVD present. No thyromegaly present.  Cardiovascular: Normal rate, regular rhythm, normal heart sounds and intact distal pulses.   No murmur heard. Pulmonary/Chest: Breath sounds normal. No respiratory distress. She has no wheezes.  Abdominal: Soft. Bowel sounds are normal. She exhibits no distension and no mass. There is no tenderness.  Musculoskeletal: Normal range of motion. She exhibits no edema.  Lymphadenopathy:    She has no cervical adenopathy.  Neurological: She is alert and oriented to person, place, and time. She has normal strength. No cranial nerve deficit or sensory deficit.  Skin: Skin is warm and dry.  Psychiatric: She has a normal mood and affect.     Diagnostic Tests:  CLINICAL DATA: Pt reports hx of seasonal allergies; onset seasonal allergies 04/19/15 with complaint of sinusitis and headache; reports taking antihistamine and tylenol for symptoms. Pt reports resolving sinusitis but continued headache with new onset SOB and weakness this morning with awakening at 0400. Pt continues to report decreased urine output related to  dehydration.  EXAM: CT ANGIOGRAPHY CHEST WITH CONTRAST  TECHNIQUE: Multidetector CT imaging of the chest was performed using the standard protocol during bolus administration of intravenous contrast. Multiplanar CT image reconstructions and MIPs were obtained to evaluate the vascular anatomy.  CONTRAST: 100 mL of Isovue 370 intravenous contrast.  COMPARISON: Current chest radiograph  FINDINGS: Angiographic study: No evidence a pulmonary embolism. No aortic dissection. Ascending aorta measures 4.6 cm. Normal caliber across the aortic arch and descending thoracic aorta. Mild partly calcified atherosclerotic plaque.  Neck base and axilla: No mass or adenopathy. Visualized thyroid is unremarkable.  Mediastinum and hila: Heart mildly enlarged. No mediastinal or hilar masses or pathologically enlarged lymph nodes.  Lungs and pleura: Minor areas of peripheral scarring and subsegmental atelectasis. No pneumonia or edema. No mass or suspicious nodule. No pleural effusion or pneumothorax.  Limited upper abdomen: The unremarkable.  Musculoskeletal: Bony thorax is demineralized. Mild degenerative changes of the mid to lower thoracic spine. No osteoblastic or osteolytic lesions.  Review of the MIP images confirms the above findings.  IMPRESSION: 1. No evidence of a pulmonary embolism. 2. No acute findings. 3. Dilated ascending thoracic aorta to 4.6 cm. Ascending thoracic aortic aneurysm. Recommend semi-annual imaging followup by CTA or MRA and referral to cardiothoracic surgery if not already obtained. This recommendation follows 2010 ACCF/AHA/AATS/ACR/ASA/SCA/SCAI/SIR/STS/SVM Guidelines for the Diagnosis and Management of Patients With Thoracic Aortic Disease. Circulation. 2010; [id number]   Electronically Signed  By: Lajean Manes M.D.  On: 04/25/2015 18:24  Impression:  She has a 4.6 cm fusiform ascending aortic aneurysm with a descending aorta  measuring 2.1 cm. She had a CT scan in 2002 and the report states that the ascending aorta was 4.2 cm at that time. This is not much change over 15 years so hopefully it will remain stable. She says that her BP is  under good control at home although it is elevated today. She is on a beta blocker and ACE I so this will need to be monitored. I stressed the need for good blood pressure control with her to decrease the risk of aortic dissection. She is scheduled for a nuclear stress test and echo in the near future.   Plan:  I will see her back in one year with a CTA of the chest to reassess the aneurysm.  Gaye Pollack, MD [facility] [phone]

## 2015-05-04 ENCOUNTER — Encounter: Payer: Self-pay | Admitting: Cardiology

## 2015-05-12 ENCOUNTER — Encounter: Payer: Self-pay | Admitting: *Deleted

## 2015-05-12 ENCOUNTER — Telehealth: Payer: Self-pay | Admitting: Cardiology

## 2015-05-12 ENCOUNTER — Encounter: Payer: Self-pay | Admitting: Cardiology

## 2015-05-12 NOTE — Telephone Encounter (Signed)
This encounter was created in error - please disregard.

## 2015-05-12 NOTE — Telephone Encounter (Signed)
Patient requested a call back from you discuss the medication that you had mentioned to her earlier she thought it was atorvastatin, but she was not sure. I do not see any mention of this in her chart. Please advise. She can be reached at [phone]. Thanks, MI

## 2015-05-12 NOTE — Telephone Encounter (Signed)
Informed patient that because she is not a dialysis patient and her creatinine has historically been fine, she should not have a problem with the radioactive tracer given during the myoview. Informed her they will push fluids after the tests and as long as she drinks plenty of water she will be fine. Patient was grateful for call.

## 2015-05-12 NOTE — Telephone Encounter (Signed)
New Message  Pt called states that she has an appt for a myocardial perfusion on 05/18/15 @ 12:30p. Pt request a call back to discuss the Dye from the test and how it will interact with her Kidneys. Please call back to discuss.

## 2015-05-13 ENCOUNTER — Telehealth (HOSPITAL_COMMUNITY): Payer: Self-pay | Admitting: *Deleted

## 2015-05-13 ENCOUNTER — Telehealth: Payer: Self-pay

## 2015-05-13 DIAGNOSIS — I700 Atherosclerosis of aorta: Secondary | ICD-10-CM

## 2015-05-13 MED ORDER — PRAVASTATIN SODIUM 20 MG PO TABS: 20.0000 mg | ORAL_TABLET | Freq: Every day | ORAL | Status: DC

## 2015-05-13 NOTE — Telephone Encounter (Signed)
Pt aware of suggested medication changes. Pt willing to try the Pravastatin 20 mg daily; pt setup for fasting repeat blood work on 5/24. Rx sent to preferred pharmacy and labe orders placed.

## 2015-05-13 NOTE — Telephone Encounter (Signed)
-----   Message from Sueanne Margarita, MD sent at 05/12/2015  9:59 PM EDT ----- Pravastatin has less side effects so try Pravastatin 20mg  daily and recheck FLP and ALT in 6 weeks

## 2015-05-13 NOTE — Telephone Encounter (Signed)
Patient given detailed instructions per Myocardial Perfusion Study Information Sheet for the test on 05/18/15. Patient notified to arrive 15 minutes early and that it is imperative to arrive on time for appointment to keep from having the test rescheduled.  If you need to cancel or reschedule your appointment, please call the office within 24 hours of your appointment. Failure to do so may result in a cancellation of your appointment, and a $50 no show fee. Patient verbalized understanding. Hubbard Robinson. RN

## 2015-05-15 ENCOUNTER — Telehealth: Payer: Self-pay | Admitting: *Deleted

## 2015-05-15 NOTE — Telephone Encounter (Signed)
LVM for pt to change appointment time and date due to provider schedule will call back if any conflicts

## 2015-05-18 ENCOUNTER — Ambulatory Visit (HOSPITAL_BASED_OUTPATIENT_CLINIC_OR_DEPARTMENT_OTHER): Payer: Medicare Other

## 2015-05-18 ENCOUNTER — Encounter: Payer: Medicare Other | Admitting: Cardiology

## 2015-05-18 ENCOUNTER — Ambulatory Visit (HOSPITAL_COMMUNITY): Payer: Medicare Other | Attending: Cardiology

## 2015-05-18 ENCOUNTER — Other Ambulatory Visit: Payer: Self-pay

## 2015-05-18 DIAGNOSIS — E785 Hyperlipidemia, unspecified: Secondary | ICD-10-CM | POA: Diagnosis not present

## 2015-05-18 DIAGNOSIS — R0602 Shortness of breath: Secondary | ICD-10-CM

## 2015-05-18 DIAGNOSIS — I119 Hypertensive heart disease without heart failure: Secondary | ICD-10-CM | POA: Diagnosis not present

## 2015-05-18 DIAGNOSIS — E119 Type 2 diabetes mellitus without complications: Secondary | ICD-10-CM | POA: Insufficient documentation

## 2015-05-18 DIAGNOSIS — R002 Palpitations: Secondary | ICD-10-CM | POA: Insufficient documentation

## 2015-05-18 DIAGNOSIS — R079 Chest pain, unspecified: Secondary | ICD-10-CM

## 2015-05-18 DIAGNOSIS — R0600 Dyspnea, unspecified: Secondary | ICD-10-CM | POA: Diagnosis present

## 2015-05-18 DIAGNOSIS — I351 Nonrheumatic aortic (valve) insufficiency: Secondary | ICD-10-CM | POA: Insufficient documentation

## 2015-05-18 DIAGNOSIS — R0609 Other forms of dyspnea: Secondary | ICD-10-CM | POA: Insufficient documentation

## 2015-05-18 DIAGNOSIS — I77810 Thoracic aortic ectasia: Secondary | ICD-10-CM | POA: Diagnosis not present

## 2015-05-18 LAB — MYOCARDIAL PERFUSION IMAGING
CHL CUP NUCLEAR SDS: 6
CHL CUP RESTING HR STRESS: 50 {beats}/min
LV sys vol: 17 mL
LVDIAVOL: 58 mL (ref 46–106)
Peak HR: 68 {beats}/min
RATE: 0.32
SRS: 1
SSS: 6
TID: 0.99

## 2015-05-18 MED ORDER — TECHNETIUM TC 99M SESTAMIBI GENERIC - CARDIOLITE: 10.6000 | Freq: Once | INTRAVENOUS | Status: DC | PRN

## 2015-05-18 MED ORDER — REGADENOSON 0.4 MG/5ML IV SOLN: 0.4000 mg | Freq: Once | INTRAVENOUS | Status: DC

## 2015-05-18 MED ORDER — TECHNETIUM TC 99M SESTAMIBI GENERIC - CARDIOLITE: 33.0000 | Freq: Once | INTRAVENOUS | Status: DC | PRN

## 2015-05-19 ENCOUNTER — Telehealth: Payer: Self-pay

## 2015-05-19 ENCOUNTER — Encounter: Payer: Self-pay | Admitting: Nurse Practitioner

## 2015-05-19 ENCOUNTER — Ambulatory Visit (INDEPENDENT_AMBULATORY_CARE_PROVIDER_SITE_OTHER): Payer: Medicare Other | Admitting: Nurse Practitioner

## 2015-05-19 VITALS — BP 150/76 | HR 56 | Ht 62.0 in | Wt 173.8 lb

## 2015-05-19 DIAGNOSIS — R079 Chest pain, unspecified: Secondary | ICD-10-CM

## 2015-05-19 DIAGNOSIS — I77810 Thoracic aortic ectasia: Secondary | ICD-10-CM

## 2015-05-19 DIAGNOSIS — E785 Hyperlipidemia, unspecified: Secondary | ICD-10-CM | POA: Diagnosis not present

## 2015-05-19 DIAGNOSIS — I10 Essential (primary) hypertension: Secondary | ICD-10-CM

## 2015-05-19 NOTE — Progress Notes (Signed)
CARDIOLOGY OFFICE NOTE  Date:  05/19/2015    [name] Date of Birth: 1932-08-03 Medical Record [id number]  PCP:  Gara Kroner, MD  Cardiologist:  Radford Pax    Chief Complaint  Patient presents with  . Chest Pain    Post Myoview visit - seen for Dr. Radford Pax    History of Present Illness: [name] is a 82 y.o. female who presents today for a follow up visit. Seen for Dr. Radford Pax.   She has a history of HTN, HLD, MVP, PUD with hiatal hernia and DM. She also has a history of cerebral aneurysm s/p coiling.She has a history of syncope a year ago from presumed orthostasis.   She was recently seen here for evaluation of SOB and thoracic aneurysm.  She was having a lot of problems with weakness and SOB and was seen in the walkin clinic and sent to the ER towards the end of March. Troponin was negative x 1. D-Dimer was elevated and chest CTA showed a moderately dilated ascending thoracic aortic aneurysm measuring 4.6 cm. She had been having some episodes of atypical chest pain that was mid sternal and described as a heaviness that comes and goes. Lexiscan and echo were arranged - these turned out ok. She was also referred to Dr. Cyndia Bent for her thoracic aneurysm - he saw her just a few weeks - will see her back in one year. Advised good BP control.   Comes back today. Here with her husband. Using a walker. She is doing ok. No more chest pain. Not short of breath. Says her BP is some better at home. She had her atenolol increased at last visit. Probably gets too much salt. Some stress with her husband - says he has some dementia. Labs are checked by PCP. Her cardiac studies were called to her later this morning.   Past Medical History  Diagnosis Date  . Anxiety   . Migraine   . DM (diabetes mellitus) ([place])   . GERD (gastroesophageal reflux disease)   . HLD (hyperlipidemia)   . HTN (hypertension)   . History of cerebral aneurysm repair     s/p coiling  . Peripheral  neuropathy ([place])   . IBS (irritable bowel syndrome)   . Diverticulosis   . Adenomatous colon polyp 1970     carcinoma in situ  . History of hemorrhoids     with bleeding  . Hiatal hernia 02/03/05  . History of shingles   . UTI (lower urinary tract infection)   . DDD (degenerative disc disease)   . Iron deficiency   . Acute torn meniscus of knee   . Toe fracture, right     second toe  . Fundic gland polyposis of stomach   . Duodenitis     peptic, with gastric heterotopia  . Endometriosis     s/p hysterectomy  . MVP (mitral valve prolapse)   . Allergic rhinitis   . Varicose vein   . Osteopenia   . Hypercholesteremia   . Benign essential tremor syndrome   . Heart murmur   . Amaurosis fugax 08/07/2012  . colon ca dx'd 1970    surg only  . Diverticulitis   . Abdominal pain, chronic, left lower quadrant     Past Surgical History  Procedure Laterality Date  . Right hand decompressive fasciotomy  08/26/07    , dorsal and volar  . Carpal tunnel release  08/26/07  . Appendectomy    . Colon  resection    . Abdominal hysterectomy    . Coronary angioplasty with stent placement    . Cerebral aneurysm repair    . Esophagogastroduodenoscopy  02/03/05    hiatal hernia, 6 benign gastric polyps  . Colonoscopy w/ biopsies and polypectomy  02/03/05    diverticulosis, 4 mm sessile polyps, internal and external hemorrhoids  . Colonoscopy  06/07/08    divertiulosis, internal hemorrhoids  . Tonsillectomy    . Flexible sigmoidoscopy    . Abdominal hysterectomy    . Cerebral aneurysm repair    . Cataract extraction    . Hand surgery Right   . Intraocular lens insertion       Medications: Current Outpatient Prescriptions  Medication Sig Dispense Refill  . acetaminophen (TYLENOL) 500 MG tablet Take 500 mg by mouth every 6 (six) hours as needed for mild pain, moderate pain, fever or headache.    . ALPRAZolam (XANAX) 0.5 MG tablet Take 0.5 mg by mouth at bedtime.     Marland Kitchen atenolol (TENORMIN) 50 MG  tablet Take 1 tablet (50 mg total) by mouth daily. 30 tablet 11  . Cholecalciferol (VITAMIN D-3) 1000 UNITS CAPS Take 3 capsules by mouth at bedtime.     . Coenzyme Q10 100 MG capsule Take 100 mg by mouth daily.      . furosemide (LASIX) 20 MG tablet Take 20 mg by mouth daily as needed (for swelling).     Marland Kitchen glycopyrrolate (ROBINUL) 2 MG tablet TAKE 1 TABLET BY MOUTH 3 TIMES DAILY *PT NEEDS TO MAKE APPT** (Patient taking differently: TAKE 1 TABLET BY MOUTH 3 TIMES DAILY as needed for DVT) 90 tablet 1  . HYDROcodone-acetaminophen (NORCO/VICODIN) 5-325 MG tablet Take 1 tablet by mouth every 6 (six) hours as needed for severe pain. 15 tablet 0  . Insulin Glargine (LANTUS SOLOSTAR) 100 UNIT/ML SOPN Inject 28 Units into the skin daily with breakfast.     . loratadine (CLARITIN) 10 MG tablet Take 10 mg by mouth daily as needed for allergies.    . Multiple Vitamin (MULTIVITAMIN) tablet Take 1 tablet by mouth daily.      . ondansetron (ZOFRAN-ODT) 8 MG disintegrating tablet Take 1 tablet (8 mg total) by mouth once. 20 tablet 0  . pantoprazole (PROTONIX) 40 MG tablet TAKE 1 TABLET BY MOUTH ONCE DAILY 30 tablet 1  . pravastatin (PRAVACHOL) 20 MG tablet Take 1 tablet (20 mg total) by mouth daily. 90 tablet 3  . trandolapril (MAVIK) 4 MG tablet Take 4 mg by mouth daily.       No current facility-administered medications for this visit.   Facility-Administered Medications Ordered in Other Visits  Medication Dose Route Frequency Provider Last Rate Last Dose  . regadenoson (LEXISCAN) injection SOLN 0.4 mg  0.4 mg Intravenous Once Josue Hector, MD      . technetium sestamibi generic (CARDIOLITE) injection 11 milli Curie  11 milli Curie Intravenous Once PRN Josue Hector, MD      . technetium sestamibi generic (CARDIOLITE) injection 33 milli Curie  33 milli Curie Intravenous Once PRN Josue Hector, MD        Allergies: Allergies  Allergen Reactions  . Actos [Pioglitazone]     Chronic  Pedal  edema:contraindication  . Avandia [Rosiglitazone]     Chronic pedal edema:contraindication  . Fosamax [Alendronate Sodium]     unknown  . Ultram [Tramadol] Shortness Of Breath and Palpitations  . Glimepiride     hypersensitivity  . Januvia [Sitagliptin]  hypersensitivity  . Lipitor [Atorvastatin]     Causes muscle pain and swelling  . Metformin And Related     gastritis  . Prandin [Repaglinide]     Abdominal pain: side effects  . Tradjenta [Linagliptin]     GI problems, hypersensitivity  . Zoloft [Sertraline Hcl]     Jittery or zombie like  . Aspirin Other (See Comments)    Bleeding ulcers   . Bentyl [Dicyclomine Hcl]     Came close to passing out. weak  . Ciprofloxacin Nausea And Vomiting    Severe stomach upset  . Keflex [Cephalexin]   . Morphine And Related Other (See Comments)    Body Spasms    Social History: The patient  reports that she has never smoked. She has never used smokeless tobacco. She reports that she does not drink alcohol or use illicit drugs.   Family History: The patient's family history includes Diabetes in her brother, brother, and paternal grandmother; Heart disease in her brother, brother, and father; Hypertension in her brother; Kidney disease in her father; Microcephaly in her brother; Ovarian cancer in her mother.   Review of Systems: Please see the history of present illness.   Otherwise, the review of systems is positive for none.   All other systems are reviewed and negative.   Physical Exam: VS:  BP 150/76 mmHg  Pulse 56  Ht 5\' 2"  (1.575 m)  Wt 173 lb 12.8 oz (78.835 kg)  BMI 31.78 kg/m2 .  BMI Body mass index is 31.78 kg/(m^2).  Wt Readings from Last 3 Encounters:  05/19/15 173 lb 12.8 oz (78.835 kg)  05/18/15 173 lb (78.472 kg)  04/30/15 173 lb 12.8 oz (78.835 kg)    General: Pleasant. Elderly female who is alert and in no acute distress.  HEENT: Normal. Neck: Supple, no JVD, carotid bruits, or masses noted.  Cardiac:  Regular rate and rhythm. No murmurs, rubs, or gallops. Extremities are full but no significant edema.  Respiratory:  Lungs are clear to auscultation bilaterally with normal work of breathing.  GI: Soft and nontender.  MS: No deformity or atrophy. Gait and ROM intact. Skin: Warm and dry. Color is normal.  Neuro:  Strength and sensation are intact and no gross focal deficits noted.  Psych: Alert, appropriate and with normal affect.   LABORATORY DATA:  EKG:  EKG is not ordered today.  Lab Results  Component Value Date   WBC 9.3 04/25/2015   HGB 12.2 04/25/2015   HCT 38.3 04/25/2015   PLT 239 04/25/2015   GLUCOSE 113* 04/25/2015   CHOL  08/27/2007    151        ATP III CLASSIFICATION:  <200     mg/dL   Desirable  200-239  mg/dL   Borderline High  >=240    mg/dL   High   TRIG 82 08/27/2007   HDL 24* 08/27/2007   LDLCALC * 08/27/2007    111        Total Cholesterol/HDL:CHD Risk Coronary Heart Disease Risk Table                     Men   Women  1/2 Average Risk   3.4   3.3   ALT 18 12/27/2014   AST 20 12/27/2014   NA 143 04/25/2015   K 4.5 04/25/2015   CL 107 04/25/2015   CREATININE 0.92 04/25/2015   BUN 19 04/25/2015   CO2 27 04/25/2015   TSH 1.18 04/28/2015  INR 0.97 04/01/2014   HGBA1C 7.1* 04/01/2014    BNP (last 3 results) No results for input(s): BNP in the last 8760 hours.  ProBNP (last 3 results) No results for input(s): PROBNP in the last 8760 hours.   Other Studies Reviewed Today: Myoview Study Highlights from 05/2015    The left ventricular ejection fraction is normal (55-65%).  Nuclear stress EF: 71%.  There was no ST segment deviation noted during stress.  The study is normal.  This is a low risk study.  Normal resting and stress perfusion no ischemia or infarction EF 64%     Echo Study Conclusions from 05/2015  - Left ventricle: The cavity size was normal. Wall thickness was  increased in a pattern of mild LVH. Systolic function was  normal.  The estimated ejection fraction was in the range of 55% to 60%.  Wall motion was normal; there were no regional wall motion  abnormalities. Doppler parameters are consistent with abnormal  left ventricular relaxation (grade 1 diastolic dysfunction). - Aortic valve: There was no stenosis. There was trivial  regurgitation. - Aorta: Mildly dilated aortic root and ascending aorta. Aortic  root dimension: 39 mm (ED). Ascending aortic diameter: 42 mm (S). - Mitral valve: There was trivial regurgitation. - Left atrium: The atrium was mildly dilated. - Right ventricle: The cavity size was normal. Systolic function  was normal. - Tricuspid valve: Peak RV-RA gradient (S): 30 mm Hg. - Pulmonary arteries: PA peak pressure: 33 mm Hg (S). - Inferior vena cava: The vessel was normal in size. The  respirophasic diameter changes were in the normal range (>= 50%),  consistent with normal central venous pressure.  Impressions:  - Normal LV size with mild LV hypertrophy. EF 55-60%. Normal RV  size and systolic function. No significant valvular  abnormalities.  Assessment/Plan: 1. Chest pain  - low risk Myoview - no more symptoms - would favor medical management with CV risk factor modification. Labs are checked by PCP.  2. HTN - not clear how controlled her BP is - she will monitor over the next several months and see me back.   4. H/O MVP - stable echo   5. Moderately dilated ascending thoracic aneursym at 4.6cm. Following by Dr. Cyndia Bent - for scan in one year.   6. Cerebral aneurysms s/p coiling   Current medicines are reviewed with the patient today.  The patient does not have concerns regarding medicines other than what has been noted above.  The following changes have been made:  See above.  Labs/ tests ordered today include:   No orders of the defined types were placed in this encounter.     Disposition:   FU with me in 3 months with her BP diary.    Patient is agreeable to this plan and will call if any problems develop in the interim.   Signed: Burtis Junes, RN, ANP-C 05/19/2015 3:36 PM  [facility] [address] [place], [place]  [address] Phone: [phone] Fax: [phone]

## 2015-05-19 NOTE — Telephone Encounter (Signed)
Informed patient of results and verbal understanding expressed.  Repeat ECHO ordered to be scheduled in 1 year. Patient agrees with treatment plan. 

## 2015-05-19 NOTE — Patient Instructions (Addendum)
We will be checking the following labs today - NONE   Medication Instructions:    Continue with your current medicines.     Testing/Procedures To Be Arranged:  N/A  Follow-Up:   See me in 3 months with your BP diary  Other Special Instructions:   Try to restrict your salt.     If you need a refill on your cardiac medications before your next appointment, please call your pharmacy.   Call the [place] office at [phone] if you have any questions, problems or concerns.

## 2015-05-19 NOTE — Telephone Encounter (Signed)
-----   Message from Sueanne Margarita, MD sent at 05/18/2015 10:09 PM EDT ----- Echo showed normal LVF with mild LVH and increased stiffness of heart muscle, trivial AR, mildly dilated aortic root and ascending aorta which is stable - repeat echo in 1 year

## 2015-05-20 ENCOUNTER — Ambulatory Visit: Payer: Medicare Other | Admitting: Cardiology

## 2015-05-29 ENCOUNTER — Ambulatory Visit: Payer: Medicare Other | Admitting: Nurse Practitioner

## 2015-06-03 ENCOUNTER — Other Ambulatory Visit: Payer: Self-pay | Admitting: Internal Medicine

## 2015-06-04 ENCOUNTER — Telehealth: Payer: Self-pay | Admitting: Cardiology

## 2015-06-04 NOTE — Telephone Encounter (Signed)
Pt's last LDL was 94 at PCP office.  She could try low dose Crestor (5mg  three days of the week) or go off statin and see what her baseline LDL is currently to reassess her risk. With either decision, would recheck labs in 3 months.

## 2015-06-04 NOTE — Telephone Encounter (Signed)
To Lipid Clinic.

## 2015-06-04 NOTE — Telephone Encounter (Signed)
Reviewed both choices with patient. Patient decided to [place] and recheck lab work.  She is to have fasting lab work at her PCP later this summer. She agrees to have results sent to [facility] when they are drawn.  Med list updated.

## 2015-06-04 NOTE — Telephone Encounter (Signed)
New message      Pt c/o medication issue:  1. Name of Medication:  pravastatin 2. How are you currently taking this medication (dosage and times per day)?  20mg  daily 3. Are you having a reaction (difficulty breathing--STAT)?no  4. What is your medication issue?  Pt cannot take statin drug.  It is making her muscle ache and it is aggravating her neuropathy.  Pt stopped taking it 4 days ago and can tell a difference in her legs.  Please call

## 2015-06-04 NOTE — Telephone Encounter (Signed)
The pt states that she cannot tolerate Pravastatin due to muscles aches and pains. She states that she takes complete care of her husband who has dementia and she has neuropathy. She reports that she aches so bad that she cant sleep and she states "These aches and pains are preventing me from taking care of my husband and do the things that need to be done so we can survive".   She states that she did take Lipitor in the past and had the same body aches and pains and had to stop taking it as well. She wants Dr Radford Pax to know that she tried taking pravastatin but could not tolerate and she does not feel that she will be able to tolerate any statins as she has allergies to a lot of medications.  She is aware that I am forwarding this message to Dr Radford Pax and that if she has any recommendations we will call her back. She verbalized understanding.

## 2015-06-04 NOTE — Telephone Encounter (Signed)
Refer to lipid clinic 

## 2015-06-24 ENCOUNTER — Other Ambulatory Visit: Payer: Medicare Other

## 2015-07-01 ENCOUNTER — Ambulatory Visit: Payer: Medicare Other | Admitting: Internal Medicine

## 2015-08-07 ENCOUNTER — Other Ambulatory Visit: Payer: Self-pay | Admitting: Internal Medicine

## 2015-08-11 ENCOUNTER — Ambulatory Visit: Payer: Medicare Other | Admitting: Nurse Practitioner

## 2015-08-24 ENCOUNTER — Telehealth: Payer: Self-pay | Admitting: Internal Medicine

## 2015-08-24 NOTE — Telephone Encounter (Signed)
Patient reports that she has had several episodes of rectal bleeding over the last few weeks.  She is offered an appt for this afternoon with  Dr. Carlean Purl.  She declines because she doesn't have anyone to drive her today.  She reports that yesterday she has a large amount of bleeding with a BM and had some "clots".  She has not had any additional bleeding since.  She reports that each incident of rectal bleeding was associated with constipation.  She wants to try a stool softener for a few weeks and asks if that can be sent in?  I reviewed with her to take Miralax 17 gm 1-2 times a day.  She understands that if she is passing blood independent of stool she will need eval in the ED.  She will call back if she has additional bleeding with a BM to set up an appt.  She verbalized understanding.

## 2015-08-24 NOTE — Telephone Encounter (Signed)
OK w/ plan but if treating hard stools does not help needs to be seen in ofc vs ED She has a known hx hemorrhoids and also diverticulosis and either could be bleeding though hx suggests hemorrhoids

## 2015-09-02 ENCOUNTER — Encounter: Payer: Self-pay | Admitting: Nurse Practitioner

## 2015-09-02 ENCOUNTER — Ambulatory Visit (INDEPENDENT_AMBULATORY_CARE_PROVIDER_SITE_OTHER): Payer: Medicare Other | Admitting: Nurse Practitioner

## 2015-09-02 ENCOUNTER — Other Ambulatory Visit (INDEPENDENT_AMBULATORY_CARE_PROVIDER_SITE_OTHER): Payer: Medicare Other

## 2015-09-02 VITALS — BP 160/80 | HR 57 | Ht 63.0 in | Wt 170.8 lb

## 2015-09-02 DIAGNOSIS — E785 Hyperlipidemia, unspecified: Secondary | ICD-10-CM

## 2015-09-02 DIAGNOSIS — I10 Essential (primary) hypertension: Secondary | ICD-10-CM

## 2015-09-02 DIAGNOSIS — I77810 Thoracic aortic ectasia: Secondary | ICD-10-CM

## 2015-09-02 LAB — BASIC METABOLIC PANEL
BUN: 20 mg/dL (ref 7–25)
CALCIUM: 9.4 mg/dL (ref 8.6–10.4)
CO2: 26 mmol/L (ref 20–31)
CREATININE: 0.97 mg/dL — AB (ref 0.60–0.88)
Chloride: 104 mmol/L (ref 98–110)
GLUCOSE: 84 mg/dL (ref 65–99)
Potassium: 4.6 mmol/L (ref 3.5–5.3)
SODIUM: 139 mmol/L (ref 135–146)

## 2015-09-02 MED ORDER — TRANDOLAPRIL 4 MG PO TABS: 4.0000 mg | ORAL_TABLET | Freq: Two times a day (BID) | ORAL | 6 refills | Status: DC

## 2015-09-02 NOTE — Progress Notes (Signed)
CARDIOLOGY OFFICE NOTE  Date:  09/02/2015    [name] Date of Birth: [birth year] Medical Record [id number]  PCP:  Gara Kroner, MD  Cardiologist:  Radford Pax   Chief Complaint  Patient presents with  . Hypertension  . Cardiac Valve Problem    4 month check - seen for Dr. Radford Pax    History of Present Illness: [name] is a 82 y.o. female who presents today for a 4 month check. Seen for Dr. Radford Pax.   She has a history of HTN, HLD, MVP, PUD with hiatal hernia and DM. She also has a history of cerebral aneurysm s/p coiling.She has a history of syncope a year ago from presumed orthostasis.   She was recently seen here for evaluation of SOB and thoracic aneurysm.  She was having a lot of problems with weakness and SOB and was seen in the walkin clinic and sent to the ER towards the end of March. Troponin was negative x 1. D-Dimer was elevated and chest CTA showed a moderately dilated ascending thoracic aortic aneurysm measuring 4.6 cm. She had been having some episodes of atypical chest pain that was mid sternal and described as a heaviness that comes and goes. Lexiscan and echo were arranged - these turned out ok. She was also referred to Dr. Cyndia Bent for her thoracic aneurysm - he saw her just a few weeks - will see her back in one year. Advised good BP control.   I saw her back in April - she was doing ok. No more chest pain. Low risk Myoview. Some stress with her husband - he has dementia.   Comes back today. Here with her husband. Using a walker. She has been taken off of her low dose Norvasc due to "being non functional" - too sleepy after taking this. BP is not controlled by her readings at home. She remains under some stress. She says she could probably do better with salt restriction.   Past Medical History:  Diagnosis Date  . Abdominal pain, chronic, left lower quadrant   . Acute torn meniscus of knee   . Adenomatous colon polyp 1970    carcinoma in situ   . Allergic rhinitis   . Amaurosis fugax 08/07/2012  . Anxiety   . Benign essential tremor syndrome   . colon ca dx'd 1970   surg only  . DDD (degenerative disc disease)   . Diverticulitis   . Diverticulosis   . DM (diabetes mellitus) ([place])   . Duodenitis    peptic, with gastric heterotopia  . Endometriosis    s/p hysterectomy  . Fundic gland polyposis of stomach   . GERD (gastroesophageal reflux disease)   . Heart murmur   . Hiatal hernia 02/03/05  . History of cerebral aneurysm repair    s/p coiling  . History of hemorrhoids    with bleeding  . History of shingles   . HLD (hyperlipidemia)   . HTN (hypertension)   . Hypercholesteremia   . IBS (irritable bowel syndrome)   . Iron deficiency   . Migraine   . MVP (mitral valve prolapse)   . Osteopenia   . Peripheral neuropathy ([place])   . Toe fracture, right    second toe  . UTI (lower urinary tract infection)   . Varicose vein     Past Surgical History:  Procedure Laterality Date  . ABDOMINAL HYSTERECTOMY    . ABDOMINAL HYSTERECTOMY    . APPENDECTOMY    .  CARPAL TUNNEL RELEASE  08/26/07  . CATARACT EXTRACTION    . CEREBRAL ANEURYSM REPAIR    . CEREBRAL ANEURYSM REPAIR    . COLON RESECTION    . COLONOSCOPY  06/07/08   divertiulosis, internal hemorrhoids  . COLONOSCOPY W/ BIOPSIES AND POLYPECTOMY  02/03/05   diverticulosis, 4 mm sessile polyps, internal and external hemorrhoids  . CORONARY ANGIOPLASTY WITH STENT PLACEMENT    . ESOPHAGOGASTRODUODENOSCOPY  02/03/05   hiatal hernia, 6 benign gastric polyps  . FLEXIBLE SIGMOIDOSCOPY    . HAND SURGERY Right   . INTRAOCULAR LENS INSERTION    . right hand decompressive fasciotomy  08/26/07   , dorsal and volar  . TONSILLECTOMY       Medications: Current Outpatient Prescriptions  Medication Sig Dispense Refill  . acetaminophen (TYLENOL) 500 MG tablet Take 500 mg by mouth every 6 (six) hours as needed for mild pain, moderate pain, fever or headache.    . ALPRAZolam (XANAX)  0.5 MG tablet Take 0.5 mg by mouth at bedtime.     Marland Kitchen atenolol (TENORMIN) 50 MG tablet Take 1 tablet (50 mg total) by mouth daily. 30 tablet 11  . Cholecalciferol (VITAMIN D-3) 1000 UNITS CAPS Take 3 capsules by mouth at bedtime.     . Coenzyme Q10 100 MG capsule Take 100 mg by mouth daily.      . furosemide (LASIX) 20 MG tablet Take 20 mg by mouth daily as needed (for swelling).     . Insulin Glargine (LANTUS SOLOSTAR) 100 UNIT/ML SOPN Inject 28 Units into the skin daily with breakfast.     . Multiple Vitamin (MULTIVITAMIN) tablet Take 1 tablet by mouth daily.      . pantoprazole (PROTONIX) 40 MG tablet TAKE 1 TABLET BY MOUTH ONCE DAILY 30 tablet 1  . trandolapril (MAVIK) 4 MG tablet Take 1 tablet (4 mg total) by mouth 2 (two) times daily. 60 tablet 6   No current facility-administered medications for this visit.    Facility-Administered Medications Ordered in Other Visits  Medication Dose Route Frequency Provider Last Rate Last Dose  . regadenoson (LEXISCAN) injection SOLN 0.4 mg  0.4 mg Intravenous Once Josue Hector, MD      . technetium sestamibi generic (CARDIOLITE) injection 11 milli Curie  11 millicurie Intravenous Once PRN Josue Hector, MD      . technetium sestamibi generic (CARDIOLITE) injection 33 milli Curie  33 millicurie Intravenous Once PRN Josue Hector, MD        Allergies: Allergies  Allergen Reactions  . Actos [Pioglitazone]     Chronic  Pedal edema:contraindication  . Avandia [Rosiglitazone]     Chronic pedal edema:contraindication  . Fosamax [Alendronate Sodium]     unknown  . Ultram [Tramadol] Shortness Of Breath and Palpitations  . Glimepiride     hypersensitivity  . Januvia [Sitagliptin]     hypersensitivity  . Lipitor [Atorvastatin]     Causes muscle pain and swelling  . Metformin And Related     gastritis  . Prandin [Repaglinide]     Abdominal pain: side effects  . Tradjenta [Linagliptin]     GI problems, hypersensitivity  . Zoloft [Sertraline  Hcl]     Jittery or zombie like  . Aspirin Other (See Comments)    Bleeding ulcers   . Bentyl [Dicyclomine Hcl]     Came close to passing out. weak  . Ciprofloxacin Nausea And Vomiting    Severe stomach upset  . Keflex [Cephalexin]   .  Morphine And Related Other (See Comments)    Body Spasms    Social History: The patient  reports that she has never smoked. She has never used smokeless tobacco. She reports that she does not drink alcohol or use drugs.   Family History: The patient's family history includes Diabetes in her brother, brother, and paternal grandmother; Heart disease in her brother, brother, and father; Hypertension in her brother; Kidney disease in her father; Microcephaly in her brother; Ovarian cancer in her mother.   Review of Systems: Please see the history of present illness.   Otherwise, the review of systems is positive for none.   All other systems are reviewed and negative.   Physical Exam: VS:  BP (!) 160/80   Pulse (!) 57   Ht 5\' 3"  (1.6 m)   Wt 170 lb 12.8 oz (77.5 kg)   SpO2 98% Comment: at rest  BMI 30.26 kg/m  .  BMI Body mass index is 30.26 kg/m.  Wt Readings from Last 3 Encounters:  09/02/15 170 lb 12.8 oz (77.5 kg)  05/19/15 173 lb 12.8 oz (78.8 kg)  05/18/15 173 lb (78.5 kg)    General: Pleasant. Elderly female who is alert and in no acute distress.   HEENT: Normal.  Neck: Supple, no JVD, carotid bruits, or masses noted.  Cardiac: Regular rate and rhythm. No murmurs, rubs, or gallops. No edema.  Respiratory:  Lungs are clear to auscultation bilaterally with normal work of breathing.  GI: Soft and nontender.  MS: No deformity or atrophy. Gait and ROM intact. Using a walker.  Skin: Warm and dry. Color is normal.  Neuro:  Strength and sensation are intact and no gross focal deficits noted.  Psych: Alert, appropriate and with normal affect.   LABORATORY DATA:  EKG:  EKG is not ordered today.  Lab Results  Component Value Date   WBC  9.3 04/25/2015   HGB 12.2 04/25/2015   HCT 38.3 04/25/2015   PLT 239 04/25/2015   GLUCOSE 113 (H) 04/25/2015   CHOL  08/27/2007    151        ATP III CLASSIFICATION:  <200     mg/dL   Desirable  200-239  mg/dL   Borderline High  >=240    mg/dL   High   TRIG 82 08/27/2007   HDL 24 (L) 08/27/2007   LDLCALC (H) 08/27/2007    111        Total Cholesterol/HDL:CHD Risk Coronary Heart Disease Risk Table                     Men   Women  1/2 Average Risk   3.4   3.3   ALT 18 12/27/2014   AST 20 12/27/2014   NA 143 04/25/2015   K 4.5 04/25/2015   CL 107 04/25/2015   CREATININE 0.92 04/25/2015   BUN 19 04/25/2015   CO2 27 04/25/2015   TSH 1.18 04/28/2015   INR 0.97 04/01/2014   HGBA1C 7.1 (H) 04/01/2014    BNP (last 3 results)  Recent Labs  04/28/15 1528  BNP 55.6    ProBNP (last 3 results) No results for input(s): PROBNP in the last 8760 hours.   Other Studies Reviewed Today:  Myoview Study Highlights from 05/2015    The left ventricular ejection fraction is normal (55-65%).  Nuclear stress EF: 71%.  There was no ST segment deviation noted during stress.  The study is normal.  This is a low risk  study.  Normal resting and stress perfusion no ischemia or infarction EF 64%     Echo Study Conclusions from 05/2015  - Left ventricle: The cavity size was normal. Wall thickness was  increased in a pattern of mild LVH. Systolic function was normal.  The estimated ejection fraction was in the range of 55% to 60%.  Wall motion was normal; there were no regional wall motion  abnormalities. Doppler parameters are consistent with abnormal  left ventricular relaxation (grade 1 diastolic dysfunction). - Aortic valve: There was no stenosis. There was trivial  regurgitation. - Aorta: Mildly dilated aortic root and ascending aorta. Aortic  root dimension: 39 mm (ED). Ascending aortic diameter: 42 mm (S). - Mitral valve: There was trivial regurgitation. - Left  atrium: The atrium was mildly dilated. - Right ventricle: The cavity size was normal. Systolic function  was normal. - Tricuspid valve: Peak RV-RA gradient (S): 30 mm Hg. - Pulmonary arteries: PA peak pressure: 33 mm Hg (S). - Inferior vena cava: The vessel was normal in size. The  respirophasic diameter changes were in the normal range (>= 50%),  consistent with normal central venous pressure.  Impressions:  - Normal LV size with mild LV hypertrophy. EF 55-60%. Normal RV  size and systolic function. No significant valvular  abnormalities.  Assessment/Plan: 1. Chest pain  - low risk Myoview - no more symptoms - would favor medical management with CV risk factor modification. Labs are checked by PCP.  2. HTN - not controlled - has not tolerated Norvasc. She likes her Mavik - will increase to 4 mg BID. BMET today and on return.   4. H/O MVP - stable echo   5. Moderately dilated ascending thoracic aneursym at 4.6cm. Following by Dr. Cyndia Bent - for scan in one year.   6. Cerebral aneurysms s/p coiling    Current medicines are reviewed with the patient today.  The patient does not have concerns regarding medicines other than what has been noted above.  The following changes have been made:  See above.  Labs/ tests ordered today include:   No orders of the defined types were placed in this encounter.    Disposition:   FU with me in 6 weeks with BMET  Patient is agreeable to this plan and will call if any problems develop in the interim.   Signed: Burtis Junes, RN, ANP-C 09/02/2015 3:29 PM  [facility] [address] [place], [place]  [address] Phone: [phone] Fax: [phone]

## 2015-09-02 NOTE — Patient Instructions (Addendum)
.  We will be checking the following labs today - BMET   Medication Instructions:    Continue with your current medicines. BUT  I am increasing the Trandolapril to twice a day - I sent this to your drug store    Testing/Procedures To Be Arranged:  N/A  Follow-Up:   See me in 6 weeks with BMET    Other Special Instructions:   Start a new BP diary  Try to cut your salt back as much as you can    If you need a refill on your cardiac medications before your next appointment, please call your pharmacy.   Call the [place] office at [phone] if you have any questions, problems or concerns.

## 2015-10-05 ENCOUNTER — Other Ambulatory Visit: Payer: Self-pay | Admitting: Internal Medicine

## 2015-10-06 ENCOUNTER — Other Ambulatory Visit (HOSPITAL_COMMUNITY): Payer: Self-pay | Admitting: Interventional Radiology

## 2015-10-06 DIAGNOSIS — I771 Stricture of artery: Secondary | ICD-10-CM

## 2015-10-14 ENCOUNTER — Ambulatory Visit (INDEPENDENT_AMBULATORY_CARE_PROVIDER_SITE_OTHER): Payer: Medicare Other | Admitting: Nurse Practitioner

## 2015-10-14 ENCOUNTER — Encounter: Payer: Self-pay | Admitting: Nurse Practitioner

## 2015-10-14 VITALS — BP 170/72 | HR 59 | Ht 62.5 in | Wt 171.1 lb

## 2015-10-14 DIAGNOSIS — E785 Hyperlipidemia, unspecified: Secondary | ICD-10-CM

## 2015-10-14 DIAGNOSIS — I10 Essential (primary) hypertension: Secondary | ICD-10-CM | POA: Diagnosis not present

## 2015-10-14 DIAGNOSIS — R5383 Other fatigue: Secondary | ICD-10-CM

## 2015-10-14 DIAGNOSIS — I77810 Thoracic aortic ectasia: Secondary | ICD-10-CM | POA: Diagnosis not present

## 2015-10-14 MED ORDER — HYDROCHLOROTHIAZIDE 25 MG PO TABS: 25.0000 mg | ORAL_TABLET | Freq: Every day | ORAL | 3 refills | Status: DC

## 2015-10-14 NOTE — Progress Notes (Signed)
CARDIOLOGY OFFICE NOTE  Date:  10/14/2015    [name] Date of Birth: [birth year] Medical Record [id number]  PCP:  Gara Kroner, MD  Cardiologist:  Radford Pax  Chief Complaint  Patient presents with  . Hypertension    Follow up visit - seen for Dr. Radford Pax    History of Present Illness: [name] is a 82 y.o. female who presents today for a follow up visit. Seen for Dr. Radford Pax.   She has a history of HTN, HLD, MVP, PUD with hiatal hernia and DM. She also has a history of cerebral aneurysm s/p coiling.She has a history of syncope a year ago from presumed orthostasis.   She was seen here for evaluation of SOB and thoracic aneurysm. She was having a lot of problems with weakness and SOB and was seen in the walkin clinic and sent to the ER towards the end of March. Troponin was negative x 1. D-Dimer was elevated and chest CTA showed a moderately dilated ascending thoracic aortic aneurysm measuring 4.6 cm. She had been having some episodes of atypical chest pain that was mid sternal and described as a heaviness that comes and goes. Lexiscan and echo were arranged - these turned out ok. She was also referred to Dr. Cyndia Bent for her thoracic aneurysm - he saw her just a few weeks - will see her back in one year. Advised good BP control.   I saw her back in April - she was doing ok. No more chest pain. Low risk Myoview. Some stress with her husband - he has dementia. Last seen by me back in August - off Norvasc due to side effects. BP was up. Probably gets too much salt as well. Her ACE was increased.   Comes back today. Here with her husband. She says she is "just so weak". Gets so tired that she has to lie down after breakfast. This seems to be a chronic issue. Most likely stress related. BP still running too high. Does not sleep well - says "because of life/stress". She is on Xanax. Has cut back on her salt. Says her current weakness/dizziness is not as bad as it was  when on Norvasc. She is using her Lasix about 3 times a week. She wanted her blood sugar checked - it was 161. Her BP diary is reviewed from home - most of the readings are elevated. Higher here today. We checked her cuff last time and it does match up.   Past Medical History:  Diagnosis Date  . Abdominal pain, chronic, left lower quadrant   . Acute torn meniscus of knee   . Adenomatous colon polyp 1970    carcinoma in situ  . Allergic rhinitis   . Amaurosis fugax 08/07/2012  . Anxiety   . Benign essential tremor syndrome   . colon ca dx'd 1970   surg only  . DDD (degenerative disc disease)   . Diverticulitis   . Diverticulosis   . DM (diabetes mellitus) ([place])   . Duodenitis    peptic, with gastric heterotopia  . Endometriosis    s/p hysterectomy  . Fundic gland polyposis of stomach   . GERD (gastroesophageal reflux disease)   . Heart murmur   . Hiatal hernia 02/03/05  . History of cerebral aneurysm repair    s/p coiling  . History of hemorrhoids    with bleeding  . History of shingles   . HLD (hyperlipidemia)   . HTN (hypertension)   .  Hypercholesteremia   . IBS (irritable bowel syndrome)   . Iron deficiency   . Migraine   . MVP (mitral valve prolapse)   . Osteopenia   . Peripheral neuropathy ([place])   . Toe fracture, right    second toe  . UTI (lower urinary tract infection)   . Varicose vein     Past Surgical History:  Procedure Laterality Date  . ABDOMINAL HYSTERECTOMY    . ABDOMINAL HYSTERECTOMY    . APPENDECTOMY    . CARPAL TUNNEL RELEASE  08/26/07  . CATARACT EXTRACTION    . CEREBRAL ANEURYSM REPAIR    . CEREBRAL ANEURYSM REPAIR    . COLON RESECTION    . COLONOSCOPY  06/07/08   divertiulosis, internal hemorrhoids  . COLONOSCOPY W/ BIOPSIES AND POLYPECTOMY  02/03/05   diverticulosis, 4 mm sessile polyps, internal and external hemorrhoids  . CORONARY ANGIOPLASTY WITH STENT PLACEMENT    . ESOPHAGOGASTRODUODENOSCOPY  02/03/05   hiatal hernia, 6 benign gastric  polyps  . FLEXIBLE SIGMOIDOSCOPY    . HAND SURGERY Right   . INTRAOCULAR LENS INSERTION    . right hand decompressive fasciotomy  08/26/07   , dorsal and volar  . TONSILLECTOMY       Medications: Current Outpatient Prescriptions  Medication Sig Dispense Refill  . acetaminophen (TYLENOL) 500 MG tablet Take 500 mg by mouth every 6 (six) hours as needed for mild pain, moderate pain, fever or headache.    . ALPRAZolam (XANAX) 0.5 MG tablet Take 0.5 mg by mouth at bedtime.     Marland Kitchen atenolol (TENORMIN) 50 MG tablet Take 1 tablet (50 mg total) by mouth daily. 30 tablet 11  . Cholecalciferol (VITAMIN D-3) 1000 UNITS CAPS Take 3 capsules by mouth at bedtime.     . Coenzyme Q10 100 MG capsule Take 100 mg by mouth daily.      . furosemide (LASIX) 20 MG tablet Take 20 mg by mouth daily as needed (for swelling).     . Insulin Glargine (LANTUS SOLOSTAR) 100 UNIT/ML SOPN Inject 28 Units into the skin daily with breakfast.     . Multiple Vitamin (MULTIVITAMIN) tablet Take 1 tablet by mouth daily.      . pantoprazole (PROTONIX) 40 MG tablet TAKE 1 TABLET EVERY DAY 30 tablet 1  . trandolapril (MAVIK) 4 MG tablet Take 1 tablet (4 mg total) by mouth 2 (two) times daily. 60 tablet 6  . hydrochlorothiazide (HYDRODIURIL) 25 MG tablet Take 1 tablet (25 mg total) by mouth daily. 90 tablet 3   No current facility-administered medications for this visit.    Facility-Administered Medications Ordered in Other Visits  Medication Dose Route Frequency Provider Last Rate Last Dose  . regadenoson (LEXISCAN) injection SOLN 0.4 mg  0.4 mg Intravenous Once Josue Hector, MD      . technetium sestamibi generic (CARDIOLITE) injection 11 milli Curie  11 millicurie Intravenous Once PRN Josue Hector, MD      . technetium sestamibi generic (CARDIOLITE) injection 33 milli Curie  33 millicurie Intravenous Once PRN Josue Hector, MD        Allergies: Allergies  Allergen Reactions  . Actos [Pioglitazone]     Chronic  Pedal  edema:contraindication  . Avandia [Rosiglitazone]     Chronic pedal edema:contraindication  . Fosamax [Alendronate Sodium]     unknown  . Ultram [Tramadol] Shortness Of Breath and Palpitations  . Glimepiride     hypersensitivity  . Januvia [Sitagliptin]     hypersensitivity  .  Lipitor [Atorvastatin]     Causes muscle pain and swelling  . Metformin And Related     gastritis  . Prandin [Repaglinide]     Abdominal pain: side effects  . Tradjenta [Linagliptin]     GI problems, hypersensitivity  . Zoloft [Sertraline Hcl]     Jittery or zombie like  . Aspirin Other (See Comments)    Bleeding ulcers   . Bentyl [Dicyclomine Hcl]     Came close to passing out. weak  . Ciprofloxacin Nausea And Vomiting    Severe stomach upset  . Keflex [Cephalexin]   . Morphine And Related Other (See Comments)    Body Spasms  . Norvasc [Amlodipine Besylate]     Weak and dizzy.     Social History: The patient  reports that she has never smoked. She has never used smokeless tobacco. She reports that she does not drink alcohol or use drugs.   Family History: The patient's family history includes Diabetes in her brother, brother, and paternal grandmother; Heart disease in her brother, brother, and father; Hypertension in her brother; Kidney disease in her father; Microcephaly in her brother; Ovarian cancer in her mother.   Review of Systems: Please see the history of present illness.   Otherwise, the review of systems is positive for none.   All other systems are reviewed and negative.   Physical Exam: VS:  BP (!) 170/72   Pulse (!) 59   Ht 5' 2.5" (1.588 m)   Wt 171 lb 1.9 oz (77.6 kg)   SpO2 96%   BMI 30.80 kg/m  .  BMI Body mass index is 30.8 kg/m.  Wt Readings from Last 3 Encounters:  10/14/15 171 lb 1.9 oz (77.6 kg)  09/02/15 170 lb 12.8 oz (77.5 kg)  05/19/15 173 lb 12.8 oz (78.8 kg)    General: Pleasant. Well developed, well nourished and in no acute distress.   HEENT: Normal.    Neck: Supple, no JVD, carotid bruits, or masses noted.  Cardiac: Regular rate and rhythm. No murmurs, rubs, or gallops. No edema.  Respiratory:  Lungs are clear to auscultation bilaterally with normal work of breathing.  GI: Soft and nontender.  MS: No deformity or atrophy. Gait and ROM intact.  Skin: Warm and dry. Color is normal.  Neuro:  Strength and sensation are intact and no gross focal deficits noted.  Psych: Alert, appropriate and with normal affect.   LABORATORY DATA:  EKG:  EKG is not ordered today.  Lab Results  Component Value Date   WBC 9.3 04/25/2015   HGB 12.2 04/25/2015   HCT 38.3 04/25/2015   PLT 239 04/25/2015   GLUCOSE 84 09/02/2015   CHOL  08/27/2007    151        ATP III CLASSIFICATION:  <200     mg/dL   Desirable  200-239  mg/dL   Borderline High  >=240    mg/dL   High   TRIG 82 08/27/2007   HDL 24 (L) 08/27/2007   LDLCALC (H) 08/27/2007    111        Total Cholesterol/HDL:CHD Risk Coronary Heart Disease Risk Table                     Men   Women  1/2 Average Risk   3.4   3.3   ALT 18 12/27/2014   AST 20 12/27/2014   NA 139 09/02/2015   K 4.6 09/02/2015   CL 104 09/02/2015  CREATININE 0.97 (H) 09/02/2015   BUN 20 09/02/2015   CO2 26 09/02/2015   TSH 1.18 04/28/2015   INR 0.97 04/01/2014   HGBA1C 7.1 (H) 04/01/2014    BNP (last 3 results)  Recent Labs  04/28/15 1528  BNP 55.6    ProBNP (last 3 results) No results for input(s): PROBNP in the last 8760 hours.   Other Studies Reviewed Today:  Myoview Study Highlightsfrom 05/2015    The left ventricular ejection fraction is normal (55-65%).  Nuclear stress EF: 71%.  There was no ST segment deviation noted during stress.  The study is normal.  This is a low risk study.  Normal resting and stress perfusion no ischemia or infarction EF 64%     Echo Study Conclusions from 05/2015  - Left ventricle: The cavity size was normal. Wall thickness was  increased in a  pattern of mild LVH. Systolic function was normal.  The estimated ejection fraction was in the range of 55% to 60%.  Wall motion was normal; there were no regional wall motion  abnormalities. Doppler parameters are consistent with abnormal  left ventricular relaxation (grade 1 diastolic dysfunction). - Aortic valve: There was no stenosis. There was trivial  regurgitation. - Aorta: Mildly dilated aortic root and ascending aorta. Aortic  root dimension: 39 mm (ED). Ascending aortic diameter: 42 mm (S). - Mitral valve: There was trivial regurgitation. - Left atrium: The atrium was mildly dilated. - Right ventricle: The cavity size was normal. Systolic function  was normal. - Tricuspid valve: Peak RV-RA gradient (S): 30 mm Hg. - Pulmonary arteries: PA peak pressure: 33 mm Hg (S). - Inferior vena cava: The vessel was normal in size. The  respirophasic diameter changes were in the normal range (>= 50%),  consistent with normal central venous pressure.  Impressions:  - Normal LV size with mild LV hypertrophy. EF 55-60%. Normal RV  size and systolic function. No significant valvular  abnormalities.  Assessment/Plan: 1. Chest pain - low risk Myoview - no more symptoms - would favor medical management with CV risk factor modification. Labs are typically checked by PCP.  2. HTN - still not controlled - has not tolerated Norvasc. Mavik increased last time - BP still not at goal - adding HCTZ 25 mg daily. BMET today. She is to limit her use of Lasix. She will continue to monitor her BP at home.    4. H/O MVP - stable echo   5. Moderately dilated ascending thoracic aneursym at 4.6cm. Following by Dr. Cyndia Bent - for scan in one year. Needs better BP control.   6. Cerebral aneurysms s/p coiling   7. Chronic anxiety - with chronic stress - I do not really see this changing.     Current medicines are reviewed with the patient today.  The patient does not have concerns  regarding medicines other than what has been noted above.  The following changes have been made:  See above.  Labs/ tests ordered today include:    Orders Placed This Encounter  Procedures  . Basic metabolic panel     Disposition:   FU with Dr. Radford Pax in about 4 to 6 weeks with BMET on return.    Patient is agreeable to this plan and will call if any problems develop in the interim.   Signed: Burtis Junes, RN, ANP-C 10/14/2015 3:31 PM  [facility] [address] [place], [place]  [address] Phone: [phone] Fax: [phone]

## 2015-10-14 NOTE — Patient Instructions (Addendum)
We will be checking the following labs today - BMET   Medication Instructions:    Continue with your current medicines. BUT  I am adding HCTZ 25 mg a day for your blood pressure. I have sent this to the drug store.   Do not take your Lasix unless really needed     Testing/Procedures To Be Arranged:  N/A  Follow-Up:   See Dr. Radford Pax in 4 to 6 weeks with a BMET    Other Special Instructions:   Keep restricting your salt  Keep monitoring your blood pressure at home.     If you need a refill on your cardiac medications before your next appointment, please call your pharmacy.   Call the [place] office at [phone] if you have any questions, problems or concerns.

## 2015-10-15 ENCOUNTER — Telehealth: Payer: Self-pay | Admitting: Nurse Practitioner

## 2015-10-15 LAB — BASIC METABOLIC PANEL
BUN: 17 mg/dL (ref 7–25)
CO2: 27 mmol/L (ref 20–31)
Calcium: 9.6 mg/dL (ref 8.6–10.4)
Chloride: 104 mmol/L (ref 98–110)
Creat: 0.93 mg/dL — ABNORMAL HIGH (ref 0.60–0.88)
Glucose, Bld: 123 mg/dL — ABNORMAL HIGH (ref 65–99)
Potassium: 4.3 mmol/L (ref 3.5–5.3)
Sodium: 142 mmol/L (ref 135–146)

## 2015-10-15 NOTE — Telephone Encounter (Signed)
Routed labs to Dr. Moreen Fowler.

## 2015-10-15 NOTE — Telephone Encounter (Signed)
New message     Patient calling send the information from lab work to primary care . Patient has appt on Tuesday does not want to do repeat lab work.

## 2015-10-21 ENCOUNTER — Other Ambulatory Visit: Payer: Self-pay | Admitting: Nurse Practitioner

## 2015-10-26 ENCOUNTER — Ambulatory Visit: Payer: Medicare Other | Admitting: Internal Medicine

## 2015-10-26 NOTE — Telephone Encounter (Signed)
Noted that she has stopped HCTZ - please list under her allergies.   She is going to have to follow a very low salt diet.  Let's try Hydralazine 10 mg three times a day.  Continue to monitor the BP.  See Dr. Radford Pax as planned.

## 2015-10-26 NOTE — Telephone Encounter (Signed)
S/w pt does not want to start new medication right now.  Will call when ready to start new medication.  Stated htn is not good for pt's aneurysm. Please follow a low salt diet and keep a record of bp readings. Pt agreeable.  Does see Dr.Turner in November and maybe discuss possible new medication than. Medication list updated.  Lori advised.

## 2015-10-26 NOTE — Telephone Encounter (Signed)
Pt c/o medication issue:  1. Name of Medication: Hydrochlorothiazide   2. How are you currently taking this medication (dosage and times per day)? 25 1xday  3. Are you having a reaction (difficulty breathing--STAT)? no  4. What is your medication issue? SOB, muscle cramps, unary problems & high glucose levels   No issues now pt stopped taking medication on Saturday

## 2015-10-29 ENCOUNTER — Ambulatory Visit (HOSPITAL_COMMUNITY): Payer: Medicare Other

## 2015-10-29 ENCOUNTER — Encounter (HOSPITAL_COMMUNITY): Payer: Self-pay

## 2015-11-12 ENCOUNTER — Telehealth (HOSPITAL_COMMUNITY): Payer: Self-pay

## 2015-11-12 NOTE — Telephone Encounter (Signed)
Pt stated that she will call back to reschedule carotid ultrasound. She has an appt on 12/07/15 to see the heart Dr. Once she finds out what is going on with her heart then she will give [place] a call to reschedule. AW

## 2015-11-24 ENCOUNTER — Encounter: Payer: Self-pay | Admitting: Cardiology

## 2015-12-07 ENCOUNTER — Ambulatory Visit (INDEPENDENT_AMBULATORY_CARE_PROVIDER_SITE_OTHER): Payer: Medicare Other | Admitting: Cardiology

## 2015-12-07 ENCOUNTER — Encounter: Payer: Self-pay | Admitting: Cardiology

## 2015-12-07 VITALS — BP 130/62 | HR 58 | Ht 62.5 in | Wt 171.0 lb

## 2015-12-07 DIAGNOSIS — I10 Essential (primary) hypertension: Secondary | ICD-10-CM

## 2015-12-07 DIAGNOSIS — R079 Chest pain, unspecified: Secondary | ICD-10-CM | POA: Diagnosis not present

## 2015-12-07 DIAGNOSIS — R0602 Shortness of breath: Secondary | ICD-10-CM

## 2015-12-07 DIAGNOSIS — I712 Thoracic aortic aneurysm, without rupture: Secondary | ICD-10-CM | POA: Diagnosis not present

## 2015-12-07 DIAGNOSIS — I7121 Aneurysm of the ascending aorta, without rupture: Secondary | ICD-10-CM | POA: Insufficient documentation

## 2015-12-07 HISTORY — DX: Thoracic aortic aneurysm, without rupture: I71.2

## 2015-12-07 HISTORY — DX: Aneurysm of the ascending aorta, without rupture: I71.21

## 2015-12-07 NOTE — Patient Instructions (Signed)
Medication Instructions:  Your physician recommends that you continue on your current medications as directed. Please refer to the Current Medication list given to you today.   Labwork: TODAY: BMET  Testing/Procedures: Dr. Radford Pax recommends you have a CORONARY CT.  Dr. Radford Pax recommends you have an [place].  Follow-Up: Your physician wants you to follow-up in: 1 year with Dr. Radford Pax. You will receive a reminder letter in the mail two months in advance. If you don't receive a letter, please call our office to schedule the follow-up appointment.   Any Other Special Instructions Will Be Listed Below (If Applicable).     If you need a refill on your cardiac medications before your next appointment, please call your pharmacy.

## 2015-12-07 NOTE — Progress Notes (Signed)
Cardiology Office Note    Date:  12/07/2015   ID:  [name], DOB [birth year], MRN [id number]  PCP:  Gara Kroner, MD  Cardiologist:  Fransico Him, MD   Chief Complaint  Patient presents with  . Follow-up    ascending aortic anerurysm, HTN and SOB    History of Present Illness:  [name] is a 82 y.o. female who presents for followup of SOB and thoracic aneurysm.  She has a history of HTN, dyslipdiemia, MVP, PUD with hiatal hernia and Type II DM.  She also has a history of cerebral aneurysm s/p coiling.  When I saw her last she had been having a lot of problems with weakness and SOB and was seen in the walkin clinic and sent to the ER.  Troponin was negative x 1.  D-Dimer was elevated and Chest CT angio showed a moderately dilated ascending thoracic aortic aneurysm measuring 4.6cm.  She had been having some episodes of chest pain . Nuclear stress test showed no ischemia.  She was referred to Dr. Cyndia Bent for her aortic aneursym.  She is now here for followup.  She says that her weakness and SOB have worsened since I saw her last.  This limits her ability to exercise and thinks that she may be deconditioned. She has continued to have intermittent chest pain that occurs when she lays down but is worse when she is out walking.  She went to the grocery store the other day and could not catcher breath and had to go home.  She thinks that she has some  LE edema along with her peripheral neuropathy.  She denies any dizziness, palpitations or syncope.     Past Medical History:  Diagnosis Date  . Abdominal pain, chronic, left lower quadrant   . Acute torn meniscus of knee   . Adenomatous colon polyp 1970    carcinoma in situ  . Allergic rhinitis   . Amaurosis fugax 08/07/2012  . Anxiety   . Ascending aortic aneurysm ([place]) 12/07/2015  . Benign essential tremor syndrome   . colon ca dx'd 1970   surg only  . DDD (degenerative disc disease)   . Diverticulitis   . Diverticulosis   .  DM (diabetes mellitus) ([place])   . Duodenitis    peptic, with gastric heterotopia  . Endometriosis    s/p hysterectomy  . Fundic gland polyposis of stomach   . GERD (gastroesophageal reflux disease)   . Heart murmur   . Hiatal hernia 02/03/05  . History of cerebral aneurysm repair    s/p coiling  . History of hemorrhoids    with bleeding  . History of shingles   . HLD (hyperlipidemia)   . HTN (hypertension)   . Hypercholesteremia   . IBS (irritable bowel syndrome)   . Iron deficiency   . Migraine   . MVP (mitral valve prolapse)   . Osteopenia   . Peripheral neuropathy ([place])   . Toe fracture, right    second toe  . UTI (lower urinary tract infection)   . Varicose vein     Past Surgical History:  Procedure Laterality Date  . ABDOMINAL HYSTERECTOMY    . ABDOMINAL HYSTERECTOMY    . APPENDECTOMY    . CARPAL TUNNEL RELEASE  08/26/07  . CATARACT EXTRACTION    . CEREBRAL ANEURYSM REPAIR    . CEREBRAL ANEURYSM REPAIR    . COLON RESECTION    . COLONOSCOPY  06/07/08   divertiulosis, internal hemorrhoids  .  COLONOSCOPY W/ BIOPSIES AND POLYPECTOMY  02/03/05   diverticulosis, 4 mm sessile polyps, internal and external hemorrhoids  . CORONARY ANGIOPLASTY WITH STENT PLACEMENT    . ESOPHAGOGASTRODUODENOSCOPY  02/03/05   hiatal hernia, 6 benign gastric polyps  . FLEXIBLE SIGMOIDOSCOPY    . HAND SURGERY Right   . INTRAOCULAR LENS INSERTION    . right hand decompressive fasciotomy  08/26/07   , dorsal and volar  . TONSILLECTOMY      Current Medications: Outpatient Medications Prior to Visit  Medication Sig Dispense Refill  . acetaminophen (TYLENOL) 500 MG tablet Take 500 mg by mouth every 6 (six) hours as needed for mild pain, moderate pain, fever or headache.    . ALPRAZolam (XANAX) 0.5 MG tablet Take 0.5 mg by mouth at bedtime.     Marland Kitchen atenolol (TENORMIN) 50 MG tablet Take 1 tablet (50 mg total) by mouth daily. 30 tablet 11  . Cholecalciferol (VITAMIN D-3) 1000 UNITS CAPS Take 3 capsules  by mouth at bedtime.     . Coenzyme Q10 100 MG capsule Take 100 mg by mouth daily.      . furosemide (LASIX) 20 MG tablet Take 20 mg by mouth daily as needed (for swelling).     . Insulin Glargine (LANTUS SOLOSTAR) 100 UNIT/ML SOPN Inject 30 Units into the skin daily with breakfast.     . Multiple Vitamin (MULTIVITAMIN) tablet Take 1 tablet by mouth daily.      . pantoprazole (PROTONIX) 40 MG tablet TAKE 1 TABLET EVERY DAY 30 tablet 1  . trandolapril (MAVIK) 4 MG tablet Take 1 tablet (4 mg total) by mouth 2 (two) times daily. 90 tablet 3   Facility-Administered Medications Prior to Visit  Medication Dose Route Frequency Provider Last Rate Last Dose  . regadenoson (LEXISCAN) injection SOLN 0.4 mg  0.4 mg Intravenous Once Josue Hector, MD      . technetium sestamibi generic (CARDIOLITE) injection 11 milli Curie  11 millicurie Intravenous Once PRN Josue Hector, MD      . technetium sestamibi generic (CARDIOLITE) injection 33 milli Curie  33 millicurie Intravenous Once PRN Josue Hector, MD         Allergies:   Actos [pioglitazone]; Avandia [rosiglitazone]; Fosamax [alendronate sodium]; Ultram [tramadol]; Glimepiride; Januvia [sitagliptin]; Lipitor [atorvastatin]; Metformin and related; Prandin [repaglinide]; Tradjenta [linagliptin]; Zoloft [sertraline hcl]; Aspirin; Bentyl [dicyclomine hcl]; Ciprofloxacin; Hctz [hydrochlorothiazide]; Keflex [cephalexin]; Morphine and related; and Norvasc [amlodipine besylate]   Social History   Social History  . Marital status: Married    Spouse name: N/A  . Number of children: 0  . Years of education: N/A   Occupational History  . retired Retired   Social History Main Topics  . Smoking status: Never Smoker  . Smokeless tobacco: Never Used  . Alcohol use No  . Drug use: No  . Sexual activity: Not Asked   Other Topics Concern  . None   Social History Narrative  . None     Family History:  The patient's family history includes Diabetes in  her brother, brother, and paternal grandmother; Heart disease in her brother, brother, and father; Hypertension in her brother; Kidney disease in her father; Microcephaly in her brother; Ovarian cancer in her mother.   ROS:   Please see the history of present illness.    ROS All other systems reviewed and are negative.  No flowsheet data found.     PHYSICAL EXAM:   VS:  BP 130/62   Pulse Marland Kitchen)  58   Ht 5' 2.5" (1.588 m)   Wt 171 lb (77.6 kg)   SpO2 95%   BMI 30.78 kg/m    GEN: Well nourished, well developed, in no acute distress  HEENT: normal  Neck: no JVD, carotid bruits, or masses Cardiac: RRR; no murmurs, rubs, or gallops,no edema.  Intact distal pulses bilaterally.  Respiratory:  clear to auscultation bilaterally, normal work of breathing GI: soft, nontender, nondistended, + BS MS: no deformity or atrophy  Skin: warm and dry, no rash Neuro:  Alert and Oriented x 3, Strength and sensation are intact Psych: euthymic mood, full affect  Wt Readings from Last 3 Encounters:  12/07/15 171 lb (77.6 kg)  10/14/15 171 lb 1.9 oz (77.6 kg)  09/02/15 170 lb 12.8 oz (77.5 kg)      Studies/Labs Reviewed:   EKG:  EKG is not ordered today.    Recent Labs: 12/27/2014: ALT 18 04/25/2015: Hemoglobin 12.2; Platelets 239 04/28/2015: Brain Natriuretic Peptide 55.6; TSH 1.18 10/14/2015: BUN 17; Creat 0.93; Potassium 4.3; Sodium 142   Lipid Panel    Component Value Date/Time   CHOL  08/27/2007 0354    151        ATP III CLASSIFICATION:  <200     mg/dL   Desirable  200-239  mg/dL   Borderline High  >=240    mg/dL   High   TRIG 82 08/27/2007 0354   HDL 24 (L) 08/27/2007 0354   CHOLHDL 6.3 08/27/2007 0354   VLDL 16 08/27/2007 0354   LDLCALC (H) 08/27/2007 0354    111        Total Cholesterol/HDL:CHD Risk Coronary Heart Disease Risk Table                     Men   Women  1/2 Average Risk   3.4   3.3    Additional studies/ records that were reviewed today include:   none    ASSESSMENT:    1. SOB (shortness of breath)   2. Essential hypertension   3. Ascending aortic aneurysm (HCC)      PLAN:  In order of problems listed above:  1. SOB - nuclear stress test showed no ischemia and normal LVF on 2D echo with normal LVF and diastolic dysfunction.  She continues to have chest pain and SOB that seem to have gotten worse since I saw her last and has limited her ability to perform ADLs.  She has a history of cerebral aneurysms and has had coiling done.  I am unsure whether her symptoms are related to underlying anxiety, stress taking care of her husband who has dementia and deconditioning from debilitation and arthritis.  I will get a Coronary CTA to evaluate for CAD.   2. HTN - BP controlled on current meds. BP readings from home seem higher in the 150-15mmHg range.  I will get a 24 hour BP cuff to assess. Continue BB and ARB.   3. Ascending aortic aneurysm - followed by Dr. Cyndia Bent.  Repeat echo 05/2016.  Continue BB.  Start ASA.  I will get a copy of most recent FLP by PCP.    Medication Adjustments/Labs and Tests Ordered: Current medicines are reviewed at length with the patient today.  Concerns regarding medicines are outlined above.  Medication changes, Labs and Tests ordered today are listed in the Patient Instructions below.  There are no Patient Instructions on file for this visit.   Signed, Fransico Him, MD  12/07/2015  1:39 PM    [facility] [address], [place], [place]  [address] Phone: [phone]; Fax: [phone]

## 2015-12-08 ENCOUNTER — Encounter: Payer: Self-pay | Admitting: Cardiology

## 2015-12-08 LAB — BASIC METABOLIC PANEL
BUN: 17 mg/dL (ref 7–25)
CHLORIDE: 103 mmol/L (ref 98–110)
CO2: 26 mmol/L (ref 20–31)
CREATININE: 0.9 mg/dL — AB (ref 0.60–0.88)
Calcium: 9.5 mg/dL (ref 8.6–10.4)
Glucose, Bld: 85 mg/dL (ref 65–99)
Potassium: 4.6 mmol/L (ref 3.5–5.3)
Sodium: 139 mmol/L (ref 135–146)

## 2015-12-14 ENCOUNTER — Other Ambulatory Visit: Payer: Self-pay | Admitting: Internal Medicine

## 2015-12-31 ENCOUNTER — Ambulatory Visit (HOSPITAL_COMMUNITY): Payer: Medicare Other

## 2016-01-07 ENCOUNTER — Ambulatory Visit (HOSPITAL_COMMUNITY): Payer: Medicare Other

## 2016-01-10 ENCOUNTER — Other Ambulatory Visit: Payer: Self-pay | Admitting: Internal Medicine

## 2016-01-12 ENCOUNTER — Encounter: Payer: Self-pay | Admitting: *Deleted

## 2016-01-12 ENCOUNTER — Ambulatory Visit (INDEPENDENT_AMBULATORY_CARE_PROVIDER_SITE_OTHER): Payer: Medicare Other

## 2016-01-12 DIAGNOSIS — I10 Essential (primary) hypertension: Secondary | ICD-10-CM

## 2016-01-12 NOTE — Progress Notes (Signed)
Patient ID: [name], female   DOB: Jul 06, 1932, 82 y.o.   MRN: [id number] 24 hour ambulatory blood pressure monitor applied to patient using adult long cuff.

## 2016-01-15 ENCOUNTER — Telehealth: Payer: Self-pay | Admitting: Cardiology

## 2016-01-15 NOTE — Telephone Encounter (Signed)
Spoke with patient and she don't want to do the Cardiac Ct.

## 2016-01-18 ENCOUNTER — Telehealth: Payer: Self-pay

## 2016-01-18 DIAGNOSIS — I10 Essential (primary) hypertension: Secondary | ICD-10-CM

## 2016-01-18 MED ORDER — SPIRONOLACTONE 25 MG PO TABS: 12.5000 mg | ORAL_TABLET | Freq: Every day | ORAL | 11 refills | Status: DC

## 2016-01-18 NOTE — Telephone Encounter (Signed)
Ambulatory BP monitor showed average BP 151/65 and HR 88. Per Dr. Radford Pax, patient to START ALDACTONE 12.5 mg daily, check BP daily for a week and call with results, and have BMET in one week.  Informed patient of results and verbal understanding expressed.   Patient agrees to START ALDACTONE 12.5 mg daily. She does not trust her BP machine at home.  Scheduled patient for evaluation in the HTN Clinic 12/28. She understands she will have BMET drawn. Patient agrees with treatment plan.

## 2016-01-21 ENCOUNTER — Telehealth: Payer: Self-pay | Admitting: Cardiology

## 2016-01-21 NOTE — Telephone Encounter (Signed)
Agree with moving atenolol dose to see if fatigue resolves. Will f/u next week in HTN clinic.

## 2016-01-21 NOTE — Telephone Encounter (Signed)
Patient states she has yet to start Aldactone because her current regimen makes her too tired.  She states she woke up this morning and was so tired she had to take a mid-morning nap. Reviewed medications with patient. She states she STOPPED LASIX a few weeks ago because "it became too much." She was going to the bathroom too much and it was too hard. Instructed patient to try taking Atenolol at nighttime to see if symptoms subside. She will call Tuesday to follow-up to see if she feels any better. Instructed her to keep HTN Clinic OV next week to discuss medications if she does not feel better. She understands she will be called if HTN Clinic has any recs prior to that time.

## 2016-01-21 NOTE — Telephone Encounter (Signed)
Pt is calling because she is having a hard time taking the new BP medication she can't take them until after the holiday because they are making her tired she had to take a nap this morning.

## 2016-01-26 ENCOUNTER — Telehealth: Payer: Self-pay | Admitting: Cardiology

## 2016-01-26 NOTE — Telephone Encounter (Signed)
Patient wants to come to the BP clinic next week, instead of this week. Patient stated that taking her Atenolol in the evening has improved her BP and has improved how she was feeling in the mornings. Patient stated her BP is 154/65. Informed patient that her BP is still high and would encourage to keep BP appointment. Patient stated she has not started one of her BP medications yet, and she wants to take if more than just a few days before her appointment. Moved patient's BP clinic appointment to next week, as patient requested. Patient verbalized understanding and will call with any other concerns. Will forward to Dr. Theodosia Blender nurse, so she is aware.

## 2016-01-26 NOTE — Telephone Encounter (Signed)
Mrs. Mullane is calling to let you know that taking the Atenolol and it approved her problem . Wants to know do she still needs to come in for a office visit on 01/28/16 . Thanks

## 2016-01-28 ENCOUNTER — Ambulatory Visit: Payer: Medicare Other

## 2016-01-30 ENCOUNTER — Encounter (HOSPITAL_COMMUNITY): Payer: Self-pay | Admitting: Emergency Medicine

## 2016-01-30 ENCOUNTER — Emergency Department (HOSPITAL_COMMUNITY)
Admission: EM | Admit: 2016-01-30 | Discharge: 2016-01-31 | Disposition: A | Discharge status: Home / Self Care | Payer: Medicare Other | Attending: Emergency Medicine | Admitting: Emergency Medicine

## 2016-01-30 DIAGNOSIS — E1142 Type 2 diabetes mellitus with diabetic polyneuropathy: Secondary | ICD-10-CM | POA: Diagnosis not present

## 2016-01-30 DIAGNOSIS — Z794 Long term (current) use of insulin: Secondary | ICD-10-CM | POA: Insufficient documentation

## 2016-01-30 DIAGNOSIS — R079 Chest pain, unspecified: Secondary | ICD-10-CM | POA: Diagnosis present

## 2016-01-30 DIAGNOSIS — R0789 Other chest pain: Secondary | ICD-10-CM | POA: Insufficient documentation

## 2016-01-30 DIAGNOSIS — Z79899 Other long term (current) drug therapy: Secondary | ICD-10-CM | POA: Diagnosis not present

## 2016-01-30 DIAGNOSIS — I10 Essential (primary) hypertension: Secondary | ICD-10-CM | POA: Insufficient documentation

## 2016-01-30 DIAGNOSIS — R531 Weakness: Secondary | ICD-10-CM

## 2016-01-30 NOTE — ED Triage Notes (Signed)
Per EMS, pt from home with c/o weakness, intermittent chest pain and hypertension. Initial EMS BP 214/80. Pt alert, oriented, appropriate, NAD noted at this time.

## 2016-01-30 NOTE — ED Provider Notes (Signed)
[facility] DEPT Provider Note   CSN: [id number] Arrival date & time: 01/30/16  2156  By signing my name below, I, [name], attest that this documentation has been prepared under the direction and in the presence of Varney Biles, MD  Electronically Signed: [name], ED Scribe. 01/30/16. 11:51 PM.   History   Chief Complaint Chief Complaint  Patient presents with  . Weakness   The history is provided by the patient. No language interpreter was used.   HPI Comments:  [name] is a 82 y.o. female, with a hx of HTN, hyperlipidemia, DM, ascending aortic aneurism, and PSHx of brain surgery, who presents to the Emergency Department complaining of sudden onset, intermittent, moderate, throbbing mid chest pain, which lasts for seconds, and high blood pressure (214/80 per nursing note) which began at 8 PM today. She checked her BP twice before calling EMS. Pt states she felt dizzy when she awoke yesterday and just rested for the rest of the day as well as today because she felt weak. She took her BP medication at 6 PM today. She also notes a headache secondary to nasal congestion, nausea, left arm numbness which resolved, resolved SOB, pain between her shoulder blades which is worse upon palpation x today. She took tylenol with no relief. Pt denies vomiting, diaphoresis, fevers, hx of MI, any other associated symptoms and any other modifying factors at this time. She states her last cardiac stress test (chemically) was done in 04/17 after a fainting spell which was "okay". She notes a family hx of blood pressure problems and heart problems.   Past Medical History:  Diagnosis Date  . Abdominal pain, chronic, left lower quadrant   . Acute torn meniscus of knee   . Adenomatous colon polyp 1970    carcinoma in situ  . Allergic rhinitis   . Amaurosis fugax 08/07/2012  . Anxiety   . Ascending aortic aneurysm ([place]) 12/07/2015  . Benign essential tremor syndrome   . colon ca dx'd  1970   surg only  . DDD (degenerative disc disease)   . Diverticulitis   . Diverticulosis   . DM (diabetes mellitus) ([place])   . Duodenitis    peptic, with gastric heterotopia  . Endometriosis    s/p hysterectomy  . Fundic gland polyposis of stomach   . GERD (gastroesophageal reflux disease)   . Heart murmur   . Hiatal hernia 02/03/05  . History of cerebral aneurysm repair    s/p coiling  . History of hemorrhoids    with bleeding  . History of shingles   . HLD (hyperlipidemia)   . HTN (hypertension)   . Hypercholesteremia   . IBS (irritable bowel syndrome)   . Iron deficiency   . Migraine   . MVP (mitral valve prolapse)   . Osteopenia   . Peripheral neuropathy ([place])   . Toe fracture, right    second toe  . UTI (lower urinary tract infection)   . Varicose vein     Patient Active Problem List   Diagnosis Date Noted  . Ascending aortic aneurysm ([place]) 12/07/2015  . SOB (shortness of breath) 12/07/2015  . Orthostatic hypotension 04/02/2014  . Hematochezia 04/02/2014  . Pain in the chest   . Syncope 04/01/2014  . Diabetes ([place]) 04/01/2014  . Diverticulosis 04/01/2014  . Unruptured cerebral aneurysm 01/08/2014  . Multifactorial gait disorder 01/08/2014  . Hereditary and idiopathic peripheral neuropathy 01/08/2014  . Aneurysm, cerebral, nonruptured 08/07/2012  . Amaurosis fugax 08/07/2012  .  Unspecified hereditary and idiopathic peripheral neuropathy 08/07/2012  . Benign essential tremor syndrome   . Abnormal x-ray of lumbar spine 08/29/2010  . Low back pain 08/24/2010  . ANXIETY 03/26/2010  . Essential hypertension 03/26/2010  . Abdominal pain, bilateral lower quadrant 03/26/2010  . GERD 12/22/2008  . IRON DEFICIENCY 05/22/2008  . Irritable bowel syndrome 04/07/2008  . COLONIC POLYPS, ADENOMATOUS, HX OF 04/07/2008    Past Surgical History:  Procedure Laterality Date  . ABDOMINAL HYSTERECTOMY    . ABDOMINAL HYSTERECTOMY    . APPENDECTOMY    . CARPAL TUNNEL  RELEASE  08/26/07  . CATARACT EXTRACTION    . CEREBRAL ANEURYSM REPAIR    . CEREBRAL ANEURYSM REPAIR    . COLON RESECTION    . COLONOSCOPY  06/07/08   divertiulosis, internal hemorrhoids  . COLONOSCOPY W/ BIOPSIES AND POLYPECTOMY  02/03/05   diverticulosis, 4 mm sessile polyps, internal and external hemorrhoids  . CORONARY ANGIOPLASTY WITH STENT PLACEMENT    . ESOPHAGOGASTRODUODENOSCOPY  02/03/05   hiatal hernia, 6 benign gastric polyps  . FLEXIBLE SIGMOIDOSCOPY    . HAND SURGERY Right   . INTRAOCULAR LENS INSERTION    . right hand decompressive fasciotomy  08/26/07   , dorsal and volar  . TONSILLECTOMY      OB History    No data available       Home Medications    Prior to Admission medications   Medication Sig Start Date End Date Taking? Authorizing Provider  acetaminophen (TYLENOL) 500 MG tablet Take 500 mg by mouth every 6 (six) hours as needed for mild pain, moderate pain, fever or headache.   Yes Historical Provider, MD  ALPRAZolam Duanne Moron) 0.5 MG tablet Take 0.5 mg by mouth at bedtime.    Yes Historical Provider, MD  atenolol (TENORMIN) 50 MG tablet Take 1 tablet (50 mg total) by mouth daily. 04/28/15  Yes Sueanne Margarita, MD  Cholecalciferol (VITAMIN D-3) 1000 UNITS CAPS Take 3 capsules by mouth at bedtime.    Yes Historical Provider, MD  Coenzyme Q10 100 MG capsule Take 100 mg by mouth daily.     Yes Historical Provider, MD  furosemide (LASIX) 20 MG tablet Take 20 mg by mouth daily as needed (for swelling).    Yes Historical Provider, MD  Insulin Glargine (LANTUS SOLOSTAR) 100 UNIT/ML SOPN Inject 30 Units into the skin daily with breakfast.    Yes Historical Provider, MD  Multiple Vitamin (MULTIVITAMIN) tablet Take 1 tablet by mouth daily.     Yes Historical Provider, MD  pantoprazole (PROTONIX) 40 MG tablet TAKE 1 TABLET EVERY DAY 01/11/16  Yes Gatha Mayer, MD  trandolapril (MAVIK) 4 MG tablet Take 1 tablet (4 mg total) by mouth 2 (two) times daily. 10/21/15  Yes Burtis Junes, NP  spironolactone (ALDACTONE) 25 MG tablet Take 0.5 tablets (12.5 mg total) by mouth daily. 01/18/16   Sueanne Margarita, MD    Family History Family History  Problem Relation Age of Onset  . Ovarian cancer Mother   . Heart disease Father   . Kidney disease Father   . Diabetes Paternal Grandmother   . Diabetes Brother   . Hypertension Brother   . Heart disease Brother   . Diabetes Brother   . Heart disease Brother   . Microcephaly Brother   . Diabetes    . Colon cancer      Social History Social History  Substance Use Topics  . Smoking status: Never Smoker  .  Smokeless tobacco: Never Used  . Alcohol use No     Allergies   Actos [pioglitazone]; Avandia [rosiglitazone]; Fosamax [alendronate sodium]; Ultram [tramadol]; Glimepiride; Januvia [sitagliptin]; Lipitor [atorvastatin]; Metformin and related; Prandin [repaglinide]; Tradjenta [linagliptin]; Zoloft [sertraline hcl]; Aspirin; Bentyl [dicyclomine hcl]; Ciprofloxacin; Hctz [hydrochlorothiazide]; Keflex [cephalexin]; Morphine and related; and Norvasc [amlodipine besylate]   Review of Systems Review of Systems 10 systems reviewed and all are negative for acute change except as noted in the HPI.  Physical Exam Updated Vital Signs BP 169/68   Pulse 65   Temp 98.4 F (36.9 C) (Oral)   Resp 18   Ht 5\' 3"  (1.6 m)   Wt 170 lb (77.1 kg)   SpO2 99%   BMI 30.11 kg/m   Physical Exam  Constitutional: She is oriented to person, place, and time. She appears well-developed and well-nourished. No distress.  HENT:  Head: Normocephalic and atraumatic.  Eyes: EOM are normal.  Neck: Normal range of motion.  Cardiovascular: Normal rate, regular rhythm and normal heart sounds.   Pulses:      Radial pulses are 2+ on the right side, and 2+ on the left side.  Pulmonary/Chest: Effort normal and breath sounds normal.  Lungs are clear.   Musculoskeletal: Normal range of motion.  Neurological: She is alert and oriented to  person, place, and time.  Skin: Skin is warm and dry.  Psychiatric: She has a normal mood and affect. Judgment normal.  Nursing note and vitals reviewed.  ED Treatments / Results  DIAGNOSTIC STUDIES:  Oxygen Saturation is 97% on RA, normal by my interpretation.    COORDINATION OF CARE:  11:42 PM Discussed treatment plan with pt at bedside and pt agreed to plan. 2:46 AM Reassessed pt and discussed CT scan findings. Told pt that her aneurism from 03/17 is unchanged. Discussed blood pressure with pt and notes this is not worry some at this time and she is safe for DC. Advised to return to the ED if her pain worsens.   Labs (all labs ordered are listed, but only abnormal results are displayed) Labs Reviewed  CBC - Abnormal; Notable for the following:       Result Value   Hemoglobin 11.3 (*)    HCT 35.5 (*)    MCV 75.1 (*)    MCH 23.9 (*)    All other components within normal limits  BASIC METABOLIC PANEL - Abnormal; Notable for the following:    Glucose, Bld 124 (*)    All other components within normal limits  TROPONIN I  PROTIME-INR    EKG  EKG Interpretation  Date/Time:  Saturday January 30 2016 22:00:13 EST Ventricular Rate:  62 PR Interval:    QRS Duration: 108 QT Interval:  401 QTC Calculation: 408 R Axis:   62 Text Interpretation:  Sinus rhythm No acute changes No significant change since last tracing Confirmed by Kathrynn Humble, MD, Thelma Comp [phone]) on 01/30/2016 11:08:02 PM       Radiology Dg Chest 2 View  Result Date: 01/31/2016 CLINICAL DATA:  Chest and back pain. EXAM: CHEST  2 VIEW COMPARISON:  Radiographs and CT 04/25/2015 FINDINGS: Mild cardiomegaly, possibly progressed from prior reason differences in technique. There is atherosclerosis of the thoracic aorta. No pulmonary edema. No focal airspace disease. No pleural fluid or pneumothorax. Visualized osseous structures are intact. IMPRESSION: Mild cardiomegaly, equivocally progressed from prior exam versus  differences in technique. Thoracic aortic atherosclerosis. Electronically Signed   By: Jeb Levering M.D.   On: 01/31/2016 01:12  Ct Angio Chest Aorta W And/or Wo Contrast  Result Date: 01/31/2016 CLINICAL DATA:  Chest and back pain. History of aneurysm mitral valve prolapse. EXAM: CT ANGIOGRAPHY CHEST WITH CONTRAST TECHNIQUE: Multidetector CT imaging of the chest was performed using the standard protocol during bolus administration of intravenous contrast. Multiplanar CT image reconstructions and MIPs were obtained to evaluate the vascular anatomy. CONTRAST:  100 cc of Isovue 370 IV COMPARISON:  04/25/2015 CT FINDINGS: Cardiovascular: Stable 4.6 cm ascending aortic aneurysm measured at the same level as prior study. Pulsation artifacts from the adjacent main pulmonary artery on the ascending aorta on current study. No convincing evidence for dissection or leak. Atheromatous calcification o heart is borderline enlarged. No pericardial effusion. F the descending aorta which is not aneurysmal. No large central pulmonary embolus. Normal aortic arch caliber. Mediastinum/Nodes: Normal branch pattern of the great vessels. Tiny thyroid isthmic and right thyroid lobe nodules measuring up to 3 mm. No lymphadenopathy. No esophageal mass or thickening is apparent. Trachea and mainstem bronchi appear patent. Lungs/Pleura: No pneumonic consolidations. Mild dependent atelectasis. Slight respiratory motion artifacts noted in the upper lobes bilaterally. No pneumothorax. Upper Abdomen: Fatty atrophy of the pancreas. No acute upper abdominal abnormality. Musculoskeletal:  No acute osseous abnormality Review of the MIP images confirms the above findings. IMPRESSION: Stable 4.6 cm ascending aortic aneurysm. No conclusive evidence of dissection given motion artifacts from adjacent pulmonary artery. No large central pulmonary embolus. Electronically Signed   By: Ashley Royalty M.D.   On: 01/31/2016 02:39     Procedures Procedures (including critical care time)  Medications Ordered in ED Medications  iopamidol (ISOVUE-370) 76 % injection (100 mLs  Contrast Given 01/31/16 0156)     Initial Impression / Assessment and Plan / ED Course  I have reviewed the triage vital signs and the nursing notes.  Pertinent labs & imaging results that were available during my care of the patient were reviewed by me and considered in my medical decision making (see chart for details).  Clinical Course as of Jan 30 305  Nancy Fetter Jan 31, 2016  0303 CT results discussed. No chest pain or  back pain right now. Pt's 2nd trop is pending. I discussed with her once I realized that the 2nd trop was nt due yet, to stay in the ER for the 2nd draw, but pt rather go home. States that she is tired, and her husband has dementia. She understands that 2 normal trops are better than 1, and the workup was not complete. Strict ER return precautions have been discussed, and patient is agreeing with the plan and is comfortable with the workup done and the recommendations from the ER.  CT Angio Chest Aorta W and/or Wo Contrast [AN]    Clinical Course User Index [AN] Varney Biles, MD   I personally performed the services described in this documentation, which was scribed in my presence. The recorded information has been reviewed and is accurate.  82 year old female with a history of diabetes mellitus type 2, hypertension, hyperlipidemia, chronic dizziness with stable cerebral aneurysm as of MRI on 11/2014 and recent diagnosis of thoracic aneurysm presents with cc of chest pain and weakness. There is associated back pain -right between the spine, which is present even when chest pain is not present. The back pain is reproducible, but with the chest pain, and the known aneurysm, it will be prudent to ensure there is no dissection/rupture. Pt's pulses are equal. She has no focal neuro deficits or complains.  Final Clinical  Impressions(s) / ED Diagnoses   Final diagnoses:  Weakness  Atypical chest pain    New Prescriptions New Prescriptions   No medications on file      Varney Biles, MD 01/31/16 [phone]

## 2016-01-31 ENCOUNTER — Emergency Department (HOSPITAL_COMMUNITY): Payer: Medicare Other

## 2016-01-31 LAB — CBC
HEMATOCRIT: 35.5 % — AB (ref 36.0–46.0)
HEMOGLOBIN: 11.3 g/dL — AB (ref 12.0–15.0)
MCH: 23.9 pg — ABNORMAL LOW (ref 26.0–34.0)
MCHC: 31.8 g/dL (ref 30.0–36.0)
MCV: 75.1 fL — AB (ref 78.0–100.0)
Platelets: 195 10*3/uL (ref 150–400)
RBC: 4.73 MIL/uL (ref 3.87–5.11)
RDW: 15.4 % (ref 11.5–15.5)
WBC: 8.7 10*3/uL (ref 4.0–10.5)

## 2016-01-31 LAB — TROPONIN I: Troponin I: <0.03 ng/mL (ref <0.03)

## 2016-01-31 LAB — BASIC METABOLIC PANEL
ANION GAP: 7 (ref 5–15)
BUN: 15 mg/dL (ref 6–20)
CHLORIDE: 106 mmol/L (ref 101–111)
CO2: 27 mmol/L (ref 22–32)
Calcium: 9.2 mg/dL (ref 8.9–10.3)
Creatinine, Ser: 0.86 mg/dL (ref 0.44–1.00)
GFR calc non Af Amer: >60 mL/min (ref >60)
Glucose, Bld: 124 mg/dL — ABNORMAL HIGH (ref 65–99)
POTASSIUM: 4.4 mmol/L (ref 3.5–5.1)
Sodium: 140 mmol/L (ref 135–145)

## 2016-01-31 LAB — PROTIME-INR
INR: 1.05
PROTHROMBIN TIME: 13.8 s (ref 11.4–15.2)

## 2016-01-31 MED ORDER — IOPAMIDOL (ISOVUE-370) INJECTION 76%
INTRAVENOUS | Status: AC
  Administered 2016-01-31: 100 mL
  Filled 2016-01-31: qty 100

## 2016-01-31 NOTE — ED Notes (Signed)
Pt stable, ambulatory, states understanding of discharge instructions 

## 2016-01-31 NOTE — Discharge Instructions (Signed)
We saw you in the ER for the chest pain/shortness of breath. All of our cardiac workup is normal, including labs, EKG and chest X-RAY are normal. CT scan shows stable aneurysm in your chest. We are not sure what is causing your discomfort, but we feel comfortable sending you home at this time. The workup in the ER is not complete, and you should follow up with your primary care doctor for further evaluation.  Please return to the ER if you have worsening chest pain, shortness of breath, pain radiating to your jaw, shoulder, or back, sweats or fainting. Otherwise see the Cardiologist or your primary care doctor as requested.

## 2016-02-05 ENCOUNTER — Encounter: Payer: Self-pay | Admitting: Pharmacist

## 2016-02-05 ENCOUNTER — Ambulatory Visit (INDEPENDENT_AMBULATORY_CARE_PROVIDER_SITE_OTHER): Payer: Medicare Other | Admitting: Pharmacist

## 2016-02-05 VITALS — BP 162/68 | HR 55 | Wt 168.2 lb

## 2016-02-05 DIAGNOSIS — I10 Essential (primary) hypertension: Secondary | ICD-10-CM

## 2016-02-05 NOTE — Progress Notes (Signed)
Patient ID: [name]                 DOB: [birth year]                      MRN: [id number]     HPI: [name] is a 83 y.o. female patient of Dr. Radford Pax with PMH below who presents today for hypertension evaluation. Recently her ambulatory blood pressure monitor results revealed an average BP of 151/65 and HR 88. She was started on spironolactone 12.5mg  daily for which she deferred initiation secondary to fatigue. She also stopped her Lasix due to urinary symptoms. She was instructed to take atenolol at night and this seemed to improve her symptoms and blood pressure.   She presents today and states she was seen in the ED on Saturday for high blood pressure elevated to 123456 systolic. She reports that she has not started spironolactone yet. She states taking the atenolol at night has improved her lethargy.   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    Cardiac Hx: AAA, Cerebral aneurysm, HTN, orthostatic hypotension, DM, tremor  Current HTN meds:  Trandolapril 4mg  BID Spironolactone 12.5mg  daily - has not started Furosemide 20mg  PRN - not using  Atenolol 50mg  daily  Previously tried:  HCTZ - Urinary symptoms/ couldn't breathe  Norvasc - weak/dizzy  Family History: Dad with HTN, cardiac arrest in his 74s. Mother with CHF. Brother passed at 37 yo with heart attack. Another brother passed from a stroke.   Social History: Denies tobacco and alcohol.   Diet: She eats most of her meals from home though she endorses a lot of canned foods. She tries to use low or no sodium and she does try to rinse all canned vegetables. She drinks 2 cups of coffee per day. She also occasionally drinks tea when out to eat.   Exercise: She is limited due to neuropathy, but active with ADLs at home.   Home BP readings:  She does monitor but only brought 2 measurements today and both were 160s/60s  Wt Readings from Last 3  Encounters:  01/30/16 170 lb (77.1 kg)  12/07/15 171 lb (77.6 kg)  10/14/15 171 lb 1.9 oz (77.6 kg)   BP Readings from Last 3 Encounters:  02/05/16 (!) 162/68  01/31/16 169/68  12/07/15 130/62   Pulse Readings from Last 3 Encounters:  02/05/16 (!) 55  01/31/16 65  12/07/15 (!) 58    Renal function: Estimated Creatinine Clearance: 50.5 mL/min (by C-G formula based on SCr of 0.86 mg/dL).  Past Medical History:  Diagnosis Date  . Abdominal pain, chronic, left lower quadrant   . Acute torn meniscus of knee   . Adenomatous colon polyp 1970    carcinoma in situ  . Allergic rhinitis   . Amaurosis fugax 08/07/2012  . Anxiety   . Ascending aortic aneurysm ([place]) 12/07/2015  . Benign essential tremor syndrome   . colon ca dx'd 1970   surg only  . DDD (degenerative disc disease)   . Diverticulitis   . Diverticulosis   . DM (diabetes mellitus) ([place])   . Duodenitis    peptic, with gastric heterotopia  . Endometriosis    s/p hysterectomy  . Fundic gland polyposis of stomach   . GERD (gastroesophageal reflux disease)   . Heart murmur   . Hiatal hernia 02/03/05  . History of cerebral  aneurysm repair    s/p coiling  . History of hemorrhoids    with bleeding  . History of shingles   . HLD (hyperlipidemia)   . HTN (hypertension)   . Hypercholesteremia   . IBS (irritable bowel syndrome)   . Iron deficiency   . Migraine   . MVP (mitral valve prolapse)   . Osteopenia   . Peripheral neuropathy ([place])   . Toe fracture, right    second toe  . UTI (lower urinary tract infection)   . Varicose vein     Current Outpatient Prescriptions on File Prior to Visit  Medication Sig Dispense Refill  . acetaminophen (TYLENOL) 500 MG tablet Take 500 mg by mouth every 6 (six) hours as needed for mild pain, moderate pain, fever or headache.    . ALPRAZolam (XANAX) 0.5 MG tablet Take 0.5 mg by mouth at bedtime.     Marland Kitchen atenolol (TENORMIN) 50 MG tablet Take 1 tablet (50 mg total) by mouth daily. 30  tablet 11  . Cholecalciferol (VITAMIN D-3) 1000 UNITS CAPS Take 3 capsules by mouth at bedtime.     . Insulin Glargine (LANTUS SOLOSTAR) 100 UNIT/ML SOPN Inject 30 Units into the skin daily with breakfast.     . Multiple Vitamin (MULTIVITAMIN) tablet Take 1 tablet by mouth daily.      . pantoprazole (PROTONIX) 40 MG tablet TAKE 1 TABLET EVERY DAY 30 tablet 0  . trandolapril (MAVIK) 4 MG tablet Take 1 tablet (4 mg total) by mouth 2 (two) times daily. 90 tablet 3  . Coenzyme Q10 100 MG capsule Take 100 mg by mouth daily.      . furosemide (LASIX) 20 MG tablet Take 20 mg by mouth daily as needed (for swelling).     Marland Kitchen spironolactone (ALDACTONE) 25 MG tablet Take 0.5 tablets (12.5 mg total) by mouth daily. (Patient not taking: Reported on 02/05/2016) 15 tablet 11   Current Facility-Administered Medications on File Prior to Visit  Medication Dose Route Frequency Provider Last Rate Last Dose  . regadenoson (LEXISCAN) injection SOLN 0.4 mg  0.4 mg Intravenous Once Josue Hector, MD      . technetium sestamibi generic (CARDIOLITE) injection 11 milli Curie  11 millicurie Intravenous Once PRN Josue Hector, MD      . technetium sestamibi generic (CARDIOLITE) injection 33 milli Curie  33 millicurie Intravenous Once PRN Josue Hector, MD        Allergies  Allergen Reactions  . Actos [Pioglitazone]     Chronic  Pedal edema:contraindication  . Avandia [Rosiglitazone]     Chronic pedal edema:contraindication  . Fosamax [Alendronate Sodium]     unknown  . Ultram [Tramadol] Shortness Of Breath and Palpitations  . Glimepiride     hypersensitivity  . Januvia [Sitagliptin]     hypersensitivity  . Lipitor [Atorvastatin]     Causes muscle pain and swelling  . Metformin And Related     gastritis  . Prandin [Repaglinide]     Abdominal pain: side effects  . Tradjenta [Linagliptin]     GI problems, hypersensitivity  . Zoloft [Sertraline Hcl]     Jittery or zombie like  . Aspirin Other (See Comments)     Bleeding ulcers   . Bentyl [Dicyclomine Hcl]     Came close to passing out. weak  . Ciprofloxacin Nausea And Vomiting    Severe stomach upset  . Hctz [Hydrochlorothiazide] Other (See Comments)    URINARY ISSUES  . Keflex [Cephalexin]   .  Morphine And Related Other (See Comments)    Body Spasms  . Norvasc [Amlodipine Besylate]     Weak and dizzy.     Blood pressure (!) 162/68, pulse (!) 55, SpO2 97 %.   Assessment/Plan: Hypertension: BP today still elevated. Will have her start spironolactone as directed by Dr. Radford Pax - 12.5mg  daily. Will have her return for BMET and BP check in 1 week. Have asked she bring log with her as well.    Thank you, Lelan Pons. Patterson Hammersmith, [facility]  02/05/2016 5:06 PM

## 2016-02-05 NOTE — Patient Instructions (Addendum)
Return for a follow up appointment in 1 week  Check your blood pressure at home daily (if able) and keep record of the readings.  Take your BP meds as follows: START spironolactone 12.5mg  (1/2 tablet) daily   Bring all of your meds, your BP cuff and your record of home blood pressures to your next appointment.  Exercise as you're able, try to walk approximately 30 minutes per day.  Keep salt intake to a minimum, especially watch canned and prepared boxed foods.  Eat more fresh fruits and vegetables and fewer canned items.  Avoid eating in fast food restaurants.    HOW TO TAKE YOUR BLOOD PRESSURE: . Rest 5 minutes before taking your blood pressure. .  Don't smoke or drink caffeinated beverages for at least 30 minutes before. . Take your blood pressure before (not after) you eat. . Sit comfortably with your back supported and both feet on the floor (don't cross your legs). . Elevate your arm to heart level on a table or a desk. . Use the proper sized cuff. It should fit smoothly and snugly around your bare upper arm. There should be enough room to slip a fingertip under the cuff. The bottom edge of the cuff should be 1 inch above the crease of the elbow. . Ideally, take 3 measurements at one sitting and record the average.   DASH Eating Plan DASH stands for "Dietary Approaches to Stop Hypertension." The DASH eating plan is a healthy eating plan that has been shown to reduce high blood pressure (hypertension). Additional health benefits may include reducing the risk of type 2 diabetes mellitus, heart disease, and stroke. The DASH eating plan may also help with weight loss. What do I need to know about the DASH eating plan? For the DASH eating plan, you will follow these general guidelines:  Choose foods with less than 150 milligrams of sodium per serving (as listed on the food label).  Use salt-free seasonings or herbs instead of table salt or sea salt.  Check with your health care provider  or pharmacist before using salt substitutes.  Eat lower-sodium products. These are often labeled as "low-sodium" or "no salt added."  Eat fresh foods. Avoid eating a lot of canned foods.  Eat more vegetables, fruits, and low-fat dairy products.  Choose whole grains. Look for the word "whole" as the first word in the ingredient list.  Choose fish and skinless chicken or [place] more often than red meat. Limit fish, poultry, and meat to 6 oz (170 g) each day.  Limit sweets, desserts, sugars, and sugary drinks.  Choose heart-healthy fats.  Eat more home-cooked food and less restaurant, buffet, and fast food.  Limit fried foods.  Do not fry foods. Cook foods using methods such as baking, boiling, grilling, and broiling instead.  When eating at a restaurant, ask that your food be prepared with less salt, or no salt if possible. What foods can I eat? Seek help from a dietitian for individual calorie needs. Grains  Whole grain or whole wheat bread. Brown rice. Whole grain or whole wheat pasta. Quinoa, bulgur, and whole grain cereals. Low-sodium cereals. Corn or whole wheat flour tortillas. Whole grain cornbread. Whole grain crackers. Low-sodium crackers. Vegetables  Fresh or frozen vegetables (raw, steamed, roasted, or grilled). Low-sodium or reduced-sodium tomato and vegetable juices. Low-sodium or reduced-sodium tomato sauce and paste. Low-sodium or reduced-sodium canned vegetables. Fruits  All fresh, canned (in natural juice), or frozen fruits. Meat and Other Public relations account executive  beef (85% or leaner), grass-fed beef, or beef trimmed of fat. Skinless chicken or [place]. Ground chicken or [place]. Pork trimmed of fat. All fish and seafood. Eggs. Dried beans, peas, or lentils. Unsalted nuts and seeds. Unsalted canned beans. Dairy  Low-fat dairy products, such as skim or 1% milk, 2% or reduced-fat cheeses, low-fat ricotta or cottage cheese, or plain low-fat yogurt. Low-sodium or  reduced-sodium cheeses. Fats and Oils  Tub margarines without trans fats. Light or reduced-fat mayonnaise and salad dressings (reduced sodium). Avocado. Safflower, olive, or canola oils. Natural peanut or almond butter. Other  Unsalted popcorn and pretzels. The items listed above may not be a complete list of recommended foods or beverages. Contact your dietitian for more options.  What foods are not recommended? Grains  White bread. White pasta. White rice. Refined cornbread. Bagels and croissants. Crackers that contain trans fat. Vegetables  Creamed or fried vegetables. Vegetables in a cheese sauce. Regular canned vegetables. Regular canned tomato sauce and paste. Regular tomato and vegetable juices. Fruits  Canned fruit in light or heavy syrup. Fruit juice. Meat and Other Protein Products  Fatty cuts of meat. Ribs, chicken wings, bacon, sausage, bologna, salami, chitterlings, fatback, hot dogs, bratwurst, and packaged luncheon meats. Salted nuts and seeds. Canned beans with salt. Dairy  Whole or 2% milk, cream, half-and-half, and cream cheese. Whole-fat or sweetened yogurt. Full-fat cheeses or blue cheese. Nondairy creamers and whipped toppings. Processed cheese, cheese spreads, or cheese curds. Condiments  Onion and garlic salt, seasoned salt, table salt, and sea salt. Canned and packaged gravies. Worcestershire sauce. Tartar sauce. Barbecue sauce. Teriyaki sauce. Soy sauce, including reduced sodium. Steak sauce. Fish sauce. Oyster sauce. Cocktail sauce. Horseradish. Ketchup and mustard. Meat flavorings and tenderizers. Bouillon cubes. Hot sauce. Tabasco sauce. Marinades. Taco seasonings. Relishes. Fats and Oils  Butter, stick margarine, lard, shortening, ghee, and bacon fat. Coconut, palm kernel, or palm oils. Regular salad dressings. Other  Pickles and olives. Salted popcorn and pretzels. The items listed above may not be a complete list of foods and beverages to avoid. Contact your  dietitian for more information.  Where can I find more information? National Heart, Lung, and Blood Institute: travelstabloid.com This information is not intended to replace advice given to you by your health care provider. Make sure you discuss any questions you have with your health care provider. Document Released: 01/06/2011 Document Revised: 06/25/2015 Document Reviewed: 11/21/2012 Elsevier Interactive Patient Education  2017 [employer].

## 2016-02-09 ENCOUNTER — Other Ambulatory Visit: Payer: Self-pay | Admitting: Internal Medicine

## 2016-02-12 ENCOUNTER — Ambulatory Visit: Payer: Medicare Other

## 2016-02-12 ENCOUNTER — Other Ambulatory Visit: Payer: Medicare Other

## 2016-02-16 ENCOUNTER — Ambulatory Visit (INDEPENDENT_AMBULATORY_CARE_PROVIDER_SITE_OTHER): Payer: Medicare Other | Admitting: Pharmacist

## 2016-02-16 ENCOUNTER — Other Ambulatory Visit: Payer: Medicare Other

## 2016-02-16 VITALS — BP 142/62 | HR 54 | Wt 167.0 lb

## 2016-02-16 DIAGNOSIS — I10 Essential (primary) hypertension: Secondary | ICD-10-CM | POA: Diagnosis not present

## 2016-02-16 NOTE — Progress Notes (Signed)
Patient ID: [name]                 DOB: 21-Nov-1932                      MRN: [id number]     HPI: [name] is a 83 y.o. female patient of Dr. Radford Pax with PMH below who presents today for hypertension follow up. Recently her ambulatory blood pressure monitor results revealed an average BP of 151/65 and HR 88. She was started on spironolactone 12.5mg  daily for which she deferred initiation secondary to fatigue. She also stopped her Lasix due to urinary symptoms. She was instructed to take atenolol at night and this seemed to improve her symptoms and blood pressure.  At my most recent visit with her about 10 days ago she started spironolactone 12.5mg  daily.   She presents today stating that the last few days have been the best few days she has had in several months. She has been feeling well and less SOB. Yesterday she was able to walk the entire grocery store and keep her hair appt which she states would have been impossible a few weeks ago.    Cardiac Hx: AAA, Cerebral aneurysm, HTN, orthostatic hypotension, DM, tremor  Current HTN meds:  Trandolapril 4mg  BID Spironolactone 12.5mg  daily Furosemide 20mg  PRN - not using  Atenolol 50mg  daily  Previously tried:  HCTZ - Urinary symptoms/ couldn't breathe  Norvasc - weak/dizzy  Family History: Dad with HTN, cardiac arrest in his 22s. Mother with CHF. Brother passed at 22 yo with heart attack. Another brother passed from a stroke.   Social History: Denies tobacco and alcohol.   Diet: She eats most of her meals from home though she endorses a lot of canned foods. She tries to use low or no sodium and she does try to rinse all canned vegetables. She drinks 2 cups of coffee per day. She also occasionally drinks tea when out to eat.   Exercise: She is limited due to neuropathy, but active with ADLs at home.   Home BP readings:  She brought her cuff with her today and it measured >17mmHg different than manual reading. Her home  readings have been 140-150s/60s-70s with HR in high 50s to low 60s.   Wt Readings from Last 3 Encounters:  02/16/16 167 lb (75.8 kg)  02/05/16 168 lb 4 oz (76.3 kg)  01/30/16 170 lb (77.1 kg)   BP Readings from Last 3 Encounters:  02/16/16 (!) 142/62  02/05/16 (!) 162/68  01/31/16 169/68   Pulse Readings from Last 3 Encounters:  02/16/16 (!) 54  02/05/16 (!) 55  01/31/16 65    Renal function: Estimated Creatinine Clearance: 50.1 mL/min (by C-G formula based on SCr of 0.86 mg/dL).  Past Medical History:  Diagnosis Date  . Abdominal pain, chronic, left lower quadrant   . Acute torn meniscus of knee   . Adenomatous colon polyp 1970    carcinoma in situ  . Allergic rhinitis   . Amaurosis fugax 08/07/2012  . Anxiety   . Ascending aortic aneurysm ([place]) 12/07/2015  . Benign essential tremor syndrome   . colon ca dx'd 1970   surg only  . DDD (degenerative disc disease)   . Diverticulitis   . Diverticulosis   . DM (diabetes mellitus) ([place])   . Duodenitis    peptic, with gastric heterotopia  . Endometriosis    s/p hysterectomy  . Fundic gland polyposis of stomach   .  GERD (gastroesophageal reflux disease)   . Heart murmur   . Hiatal hernia 02/03/05  . History of cerebral aneurysm repair    s/p coiling  . History of hemorrhoids    with bleeding  . History of shingles   . HLD (hyperlipidemia)   . HTN (hypertension)   . Hypercholesteremia   . IBS (irritable bowel syndrome)   . Iron deficiency   . Migraine   . MVP (mitral valve prolapse)   . Osteopenia   . Peripheral neuropathy ([place])   . Toe fracture, right    second toe  . UTI (lower urinary tract infection)   . Varicose vein     Current Outpatient Prescriptions on File Prior to Visit  Medication Sig Dispense Refill  . acetaminophen (TYLENOL) 500 MG tablet Take 500 mg by mouth every 6 (six) hours as needed for mild pain, moderate pain, fever or headache.    . ALPRAZolam (XANAX) 0.5 MG tablet Take 0.5 mg by mouth at  bedtime.     Marland Kitchen atenolol (TENORMIN) 50 MG tablet Take 1 tablet (50 mg total) by mouth daily. 30 tablet 11  . Cholecalciferol (VITAMIN D-3) 1000 UNITS CAPS Take 3 capsules by mouth at bedtime.     . Coenzyme Q10 100 MG capsule Take 100 mg by mouth daily.      . Insulin Glargine (LANTUS SOLOSTAR) 100 UNIT/ML SOPN Inject 30 Units into the skin daily with breakfast.     . Multiple Vitamin (MULTIVITAMIN) tablet Take 1 tablet by mouth daily.      . pantoprazole (PROTONIX) 40 MG tablet TAKE 1 TABLET EVERY DAY 30 tablet 0  . spironolactone (ALDACTONE) 25 MG tablet Take 0.5 tablets (12.5 mg total) by mouth daily. 15 tablet 11  . trandolapril (MAVIK) 4 MG tablet Take 1 tablet (4 mg total) by mouth 2 (two) times daily. 90 tablet 3  . furosemide (LASIX) 20 MG tablet Take 20 mg by mouth daily as needed (for swelling).      Current Facility-Administered Medications on File Prior to Visit  Medication Dose Route Frequency Provider Last Rate Last Dose  . regadenoson (LEXISCAN) injection SOLN 0.4 mg  0.4 mg Intravenous Once Josue Hector, MD      . technetium sestamibi generic (CARDIOLITE) injection 11 milli Curie  11 millicurie Intravenous Once PRN Josue Hector, MD      . technetium sestamibi generic (CARDIOLITE) injection 33 milli Curie  33 millicurie Intravenous Once PRN Josue Hector, MD        Allergies  Allergen Reactions  . Actos [Pioglitazone]     Chronic  Pedal edema:contraindication  . Avandia [Rosiglitazone]     Chronic pedal edema:contraindication  . Fosamax [Alendronate Sodium]     unknown  . Ultram [Tramadol] Shortness Of Breath and Palpitations  . Glimepiride     hypersensitivity  . Januvia [Sitagliptin]     hypersensitivity  . Lipitor [Atorvastatin]     Causes muscle pain and swelling  . Metformin And Related     gastritis  . Prandin [Repaglinide]     Abdominal pain: side effects  . Tradjenta [Linagliptin]     GI problems, hypersensitivity  . Zoloft [Sertraline Hcl]      Jittery or zombie like  . Aspirin Other (See Comments)    Bleeding ulcers   . Bentyl [Dicyclomine Hcl]     Came close to passing out. weak  . Ciprofloxacin Nausea And Vomiting    Severe stomach upset  . Hctz [Hydrochlorothiazide]  Other (See Comments)    URINARY ISSUES  . Keflex [Cephalexin]   . Morphine And Related Other (See Comments)    Body Spasms  . Norvasc [Amlodipine Besylate]     Weak and dizzy.     Blood pressure (!) 142/62, pulse (!) 54, weight 167 lb (75.8 kg).   Assessment/Plan: Hypertension: BMET today. BP trended down after recheck. It was likely initially elevated due to difficulty with BP cuff. Pending lab results will have her continue current regimen and follow up in BP clinic in 4 weeks for repeat BMET and after monitor with new cuff and for additional medication titration if necessary.    Thank you, Lelan Pons. Patterson Hammersmith, [facility]  02/16/2016 3:17 PM

## 2016-02-16 NOTE — Patient Instructions (Addendum)
Return for a follow up appointment in 4 weeks  Check your blood pressure at home daily (if able) and keep record of the readings.  Take your BP meds as follows: Continue all blood pressure medications as prescribed.    Bring all of your meds, your BP cuff and your record of home blood pressures to your next appointment.  Exercise as you're able, try to walk approximately 30 minutes per day.  Keep salt intake to a minimum, especially watch canned and prepared boxed foods.  Eat more fresh fruits and vegetables and fewer canned items.  Avoid eating in fast food restaurants.    HOW TO TAKE YOUR BLOOD PRESSURE: . Rest 5 minutes before taking your blood pressure. .  Don't smoke or drink caffeinated beverages for at least 30 minutes before. . Take your blood pressure before (not after) you eat. . Sit comfortably with your back supported and both feet on the floor (don't cross your legs). . Elevate your arm to heart level on a table or a desk. . Use the proper sized cuff. It should fit smoothly and snugly around your bare upper arm. There should be enough room to slip a fingertip under the cuff. The bottom edge of the cuff should be 1 inch above the crease of the elbow. . Ideally, take 3 measurements at one sitting and record the average.

## 2016-02-17 LAB — BASIC METABOLIC PANEL
BUN / CREAT RATIO: 20 (ref 12–28)
BUN: 17 mg/dL (ref 8–27)
CO2: 27 mmol/L (ref 18–29)
CREATININE: 0.85 mg/dL (ref 0.57–1.00)
Calcium: 9.3 mg/dL (ref 8.7–10.3)
Chloride: 101 mmol/L (ref 96–106)
GFR calc Af Amer: 74 mL/min/{1.73_m2} (ref >59)
GFR calc non Af Amer: 64 mL/min/{1.73_m2} (ref >59)
GLUCOSE: 80 mg/dL (ref 65–99)
POTASSIUM: 4.3 mmol/L (ref 3.5–5.2)
SODIUM: 143 mmol/L (ref 134–144)

## 2016-03-08 ENCOUNTER — Other Ambulatory Visit: Payer: Self-pay | Admitting: *Deleted

## 2016-03-08 MED ORDER — TRANDOLAPRIL 4 MG PO TABS: 4.0000 mg | ORAL_TABLET | Freq: Two times a day (BID) | ORAL | 2 refills | Status: DC

## 2016-03-10 ENCOUNTER — Other Ambulatory Visit: Payer: Self-pay | Admitting: Internal Medicine

## 2016-03-15 ENCOUNTER — Ambulatory Visit: Payer: Medicare Other

## 2016-03-15 NOTE — Progress Notes (Unsigned)
Patient ID: [name]                 DOB: Jun 30, 1932                      MRN: [id number]     HPI: CASS NOBLE is a 83 y.o. female patient of Dr. Hermina Barters PMH below who presents today for hypertension follow up.Recently her ambulatory blood pressure monitor results revealed an average BP of 151/65 and HR 88. She was started on spironolactone 12.5mg  daily for which she deferred initiation secondary to fatigue. She also stopped her Lasix due to urinary symptoms. She was instructed to take atenolol at night and this seemed to improve her symptoms and blood pressure. At her most recent visit no medication changes were made. Her labs returned WNL and she was continued on regimen below. She was instructed to purchase new home cuff because her cuff measured >31mmHg different than manual reading.   Today she presents   Cardiac Hx: AAA, Cerebral aneurysm, HTN, orthostatic hypotension, DM, tremor  Current HTN meds: Trandolapril 4mg  BID Spironolactone 12.5mg  daily Furosemide 20mg  PRN - not using  Atenolol 50mg  daily  Previously tried: HCTZ - Urinary symptoms/ couldn't breathe  Norvasc - weak/dizzy  Family History: Dad with HTN, cardiac arrest in his 23s. Mother with CHF. Brother passed at 75 yo with heart attack. Another brother passed from a stroke.   Social History: Denies tobacco and alcohol.   Diet:She eats most of her meals from home though she endorses a lot of canned foods. She tries to use low or no sodium and she does try to rinse all canned vegetables. She drinks 2 cups of coffee per day. She also occasionally drinks tea when out to eat.   Exercise:She is limited due to neuropathy, but active with ADLs at home.   Home BP readings:   Wt Readings from Last 3 Encounters:  02/16/16 167 lb (75.8 kg)  02/05/16 168 lb 4 oz (76.3 kg)  01/30/16 170 lb (77.1 kg)   BP Readings from Last 3 Encounters:  02/16/16 (!) 142/62  02/05/16 (!) 162/68  01/31/16 169/68    Pulse Readings from Last 3 Encounters:  02/16/16 (!) 54  02/05/16 (!) 55  01/31/16 65    Renal function: CrCl cannot be calculated (Patient's most recent lab result is older than the maximum 21 days allowed.).  Past Medical History:  Diagnosis Date  . Abdominal pain, chronic, left lower quadrant   . Acute torn meniscus of knee   . Adenomatous colon polyp 1970    carcinoma in situ  . Allergic rhinitis   . Amaurosis fugax 08/07/2012  . Anxiety   . Ascending aortic aneurysm ([place]) 12/07/2015  . Benign essential tremor syndrome   . colon ca dx'd 1970   surg only  . DDD (degenerative disc disease)   . Diverticulitis   . Diverticulosis   . DM (diabetes mellitus) ([place])   . Duodenitis    peptic, with gastric heterotopia  . Endometriosis    s/p hysterectomy  . Fundic gland polyposis of stomach   . GERD (gastroesophageal reflux disease)   . Heart murmur   . Hiatal hernia 02/03/05  . History of cerebral aneurysm repair    s/p coiling  . History of hemorrhoids    with bleeding  . History of shingles   . HLD (hyperlipidemia)   . HTN (hypertension)   . Hypercholesteremia   . IBS (irritable bowel syndrome)   .  Iron deficiency   . Migraine   . MVP (mitral valve prolapse)   . Osteopenia   . Peripheral neuropathy ([place])   . Toe fracture, right    second toe  . UTI (lower urinary tract infection)   . Varicose vein     Current Outpatient Prescriptions on File Prior to Visit  Medication Sig Dispense Refill  . acetaminophen (TYLENOL) 500 MG tablet Take 500 mg by mouth every 6 (six) hours as needed for mild pain, moderate pain, fever or headache.    . ALPRAZolam (XANAX) 0.5 MG tablet Take 0.5 mg by mouth at bedtime.     Marland Kitchen atenolol (TENORMIN) 50 MG tablet Take 1 tablet (50 mg total) by mouth daily. 30 tablet 11  . Cholecalciferol (VITAMIN D-3) 1000 UNITS CAPS Take 3 capsules by mouth at bedtime.     . Coenzyme Q10 100 MG capsule Take 100 mg by mouth daily.      . furosemide (LASIX)  20 MG tablet Take 20 mg by mouth daily as needed (for swelling).     . Insulin Glargine (LANTUS SOLOSTAR) 100 UNIT/ML SOPN Inject 30 Units into the skin daily with breakfast.     . Multiple Vitamin (MULTIVITAMIN) tablet Take 1 tablet by mouth daily.      . pantoprazole (PROTONIX) 40 MG tablet TAKE 1 TABLET EVERY DAY 30 tablet 0  . spironolactone (ALDACTONE) 25 MG tablet Take 0.5 tablets (12.5 mg total) by mouth daily. 15 tablet 11  . trandolapril (MAVIK) 4 MG tablet Take 1 tablet (4 mg total) by mouth 2 (two) times daily. 180 tablet 2   Current Facility-Administered Medications on File Prior to Visit  Medication Dose Route Frequency Provider Last Rate Last Dose  . regadenoson (LEXISCAN) injection SOLN 0.4 mg  0.4 mg Intravenous Once Josue Hector, MD      . technetium sestamibi generic (CARDIOLITE) injection 11 milli Curie  11 millicurie Intravenous Once PRN Josue Hector, MD      . technetium sestamibi generic (CARDIOLITE) injection 33 milli Curie  33 millicurie Intravenous Once PRN Josue Hector, MD        Allergies  Allergen Reactions  . Actos [Pioglitazone]     Chronic  Pedal edema:contraindication  . Avandia [Rosiglitazone]     Chronic pedal edema:contraindication  . Fosamax [Alendronate Sodium]     unknown  . Ultram [Tramadol] Shortness Of Breath and Palpitations  . Glimepiride     hypersensitivity  . Januvia [Sitagliptin]     hypersensitivity  . Lipitor [Atorvastatin]     Causes muscle pain and swelling  . Metformin And Related     gastritis  . Prandin [Repaglinide]     Abdominal pain: side effects  . Tradjenta [Linagliptin]     GI problems, hypersensitivity  . Zoloft [Sertraline Hcl]     Jittery or zombie like  . Aspirin Other (See Comments)    Bleeding ulcers   . Bentyl [Dicyclomine Hcl]     Came close to passing out. weak  . Ciprofloxacin Nausea And Vomiting    Severe stomach upset  . Hctz [Hydrochlorothiazide] Other (See Comments)    URINARY ISSUES  . Keflex  [Cephalexin]   . Morphine And Related Other (See Comments)    Body Spasms  . Norvasc [Amlodipine Besylate]     Weak and dizzy.     There were no vitals taken for this visit.   Assessment/Plan: Hypertension:    Thank you, Lelan Pons. Patterson Hammersmith, [facility]  03/15/2016 8:01 AM

## 2016-03-23 ENCOUNTER — Other Ambulatory Visit: Payer: Self-pay | Admitting: Surgery

## 2016-03-23 DIAGNOSIS — I712 Thoracic aortic aneurysm, without rupture, unspecified: Secondary | ICD-10-CM

## 2016-03-29 ENCOUNTER — Ambulatory Visit: Payer: Medicare Other

## 2016-04-07 ENCOUNTER — Other Ambulatory Visit: Payer: Self-pay | Admitting: Internal Medicine

## 2016-04-12 ENCOUNTER — Ambulatory Visit: Payer: Medicare Other

## 2016-04-19 ENCOUNTER — Ambulatory Visit: Payer: Medicare Other

## 2016-04-19 ENCOUNTER — Telehealth: Payer: Self-pay | Admitting: Cardiology

## 2016-04-19 NOTE — Telephone Encounter (Signed)
New message  Pt call requesting to speak with RN. Pt states she is having kidney issues and would like to speak with RN to discuss. Please call back.

## 2016-04-19 NOTE — Telephone Encounter (Signed)
I spoke with patient. She states she is having severe pain in "bladder area" and kidneys.  She states we are adjusting her BP medication and would like to know if we would call her an ATB and something for pain. I advised her she needs to contact her PCP today regarding her current symptoms.  She voiced understanding and agreed with plan.

## 2016-04-19 NOTE — Progress Notes (Deleted)
Patient ID: [name]                 DOB: 12/14/1932                      MRN: [id number]     HPI: [name] is a 83 y.o. female patient of Dr. Radford Pax with PMH below who presents today for hypertension evaluation. Recently her ambulatory blood pressure monitor results revealed an average BP of 151/65 and HR 88. She was started on spironolactone 12.5mg  daily for which she deferred initiation secondary to fatigue. She also stopped her Lasix due to urinary symptoms. She was instructed to take atenolol at night and this seemed to improve her symptoms and blood pressure.  At her most recent visit in HTN clinic her spironolactone 12.5mg  daily was continued and she reported at that time that she was feeling the best she has in several years.    Cardiac Hx: AAA, Cerebral aneurysm, HTN, orthostatic hypotension, DM, tremor  Current HTN meds: Trandolapril 4mg  BID Spironolactone 12.5mg  daily Furosemide 20mg  PRN - not using  Atenolol 50mg  daily  Previously tried: HCTZ - Urinary symptoms/ couldn't breathe  Norvasc - weak/dizzy  Family History: Dad with HTN, cardiac arrest in his 7s. Mother with CHF. Brother passed at 31 yo with heart attack. Another brother passed from a stroke.   Social History: Denies tobacco and alcohol.   Diet:She eats most of her meals from home though she endorses a lot of canned foods. She tries to use low or no sodium and she does try to rinse all canned vegetables. She drinks 2 cups of coffee per day. She also occasionally drinks tea when out to eat.   Exercise:She is limited due to neuropathy, but active with ADLs at home.   Home BP readings:   Wt Readings from Last 3 Encounters:  02/16/16 167 lb (75.8 kg)  02/05/16 168 lb 4 oz (76.3 kg)  01/30/16 170 lb (77.1 kg)   BP Readings from Last 3 Encounters:  02/16/16 (!) 142/62  02/05/16 (!) 162/68  01/31/16 169/68   Pulse Readings from Last 3 Encounters:  02/16/16 (!) 54  02/05/16 (!) 55    01/31/16 65    Renal function: CrCl cannot be calculated (Patient's most recent lab result is older than the maximum 21 days allowed.).  Past Medical History:  Diagnosis Date  . Abdominal pain, chronic, left lower quadrant   . Acute torn meniscus of knee   . Adenomatous colon polyp 1970    carcinoma in situ  . Allergic rhinitis   . Amaurosis fugax 08/07/2012  . Anxiety   . Ascending aortic aneurysm ([place]) 12/07/2015  . Benign essential tremor syndrome   . colon ca dx'd 1970   surg only  . DDD (degenerative disc disease)   . Diverticulitis   . Diverticulosis   . DM (diabetes mellitus) ([place])   . Duodenitis    peptic, with gastric heterotopia  . Endometriosis    s/p hysterectomy  . Fundic gland polyposis of stomach   . GERD (gastroesophageal reflux disease)   . Heart murmur   . Hiatal hernia 02/03/05  . History of cerebral aneurysm repair    s/p coiling  . History of hemorrhoids    with bleeding  . History of shingles   . HLD (hyperlipidemia)   . HTN (hypertension)   . Hypercholesteremia   . IBS (irritable bowel syndrome)   . Iron deficiency   .  Migraine   . MVP (mitral valve prolapse)   . Osteopenia   . Peripheral neuropathy ([place])   . Toe fracture, right    second toe  . UTI (lower urinary tract infection)   . Varicose vein     Current Outpatient Prescriptions on File Prior to Visit  Medication Sig Dispense Refill  . acetaminophen (TYLENOL) 500 MG tablet Take 500 mg by mouth every 6 (six) hours as needed for mild pain, moderate pain, fever or headache.    . ALPRAZolam (XANAX) 0.5 MG tablet Take 0.5 mg by mouth at bedtime.     Marland Kitchen atenolol (TENORMIN) 50 MG tablet Take 1 tablet (50 mg total) by mouth daily. 30 tablet 11  . Cholecalciferol (VITAMIN D-3) 1000 UNITS CAPS Take 3 capsules by mouth at bedtime.     . Coenzyme Q10 100 MG capsule Take 100 mg by mouth daily.      . furosemide (LASIX) 20 MG tablet Take 20 mg by mouth daily as needed (for swelling).     . Insulin  Glargine (LANTUS SOLOSTAR) 100 UNIT/ML SOPN Inject 30 Units into the skin daily with breakfast.     . Multiple Vitamin (MULTIVITAMIN) tablet Take 1 tablet by mouth daily.      . pantoprazole (PROTONIX) 40 MG tablet TAKE 1 TABLET EVERY DAY 30 tablet 0  . spironolactone (ALDACTONE) 25 MG tablet Take 0.5 tablets (12.5 mg total) by mouth daily. 15 tablet 11  . trandolapril (MAVIK) 4 MG tablet Take 1 tablet (4 mg total) by mouth 2 (two) times daily. 180 tablet 2   Current Facility-Administered Medications on File Prior to Visit  Medication Dose Route Frequency Provider Last Rate Last Dose  . regadenoson (LEXISCAN) injection SOLN 0.4 mg  0.4 mg Intravenous Once Josue Hector, MD      . technetium sestamibi generic (CARDIOLITE) injection 11 milli Curie  11 millicurie Intravenous Once PRN Josue Hector, MD      . technetium sestamibi generic (CARDIOLITE) injection 33 milli Curie  33 millicurie Intravenous Once PRN Josue Hector, MD        Allergies  Allergen Reactions  . Actos [Pioglitazone]     Chronic  Pedal edema:contraindication  . Avandia [Rosiglitazone]     Chronic pedal edema:contraindication  . Fosamax [Alendronate Sodium]     unknown  . Ultram [Tramadol] Shortness Of Breath and Palpitations  . Glimepiride     hypersensitivity  . Januvia [Sitagliptin]     hypersensitivity  . Lipitor [Atorvastatin]     Causes muscle pain and swelling  . Metformin And Related     gastritis  . Prandin [Repaglinide]     Abdominal pain: side effects  . Tradjenta [Linagliptin]     GI problems, hypersensitivity  . Zoloft [Sertraline Hcl]     Jittery or zombie like  . Aspirin Other (See Comments)    Bleeding ulcers   . Bentyl [Dicyclomine Hcl]     Came close to passing out. weak  . Ciprofloxacin Nausea And Vomiting    Severe stomach upset  . Hctz [Hydrochlorothiazide] Other (See Comments)    URINARY ISSUES  . Keflex [Cephalexin]   . Morphine And Related Other (See Comments)    Body Spasms  .  Norvasc [Amlodipine Besylate]     Weak and dizzy.     There were no vitals taken for this visit.   Assessment/Plan: Hypertension:    Thank you, Lelan Pons. Patterson Hammersmith, [facility]  04/19/2016  7:03 AM

## 2016-04-20 ENCOUNTER — Ambulatory Visit: Payer: Medicare Other | Admitting: Surgery

## 2016-04-20 ENCOUNTER — Other Ambulatory Visit: Payer: Medicare Other

## 2016-04-27 ENCOUNTER — Other Ambulatory Visit: Payer: Medicare Other

## 2016-04-27 ENCOUNTER — Ambulatory Visit: Payer: Medicare Other | Admitting: Surgery

## 2016-04-28 ENCOUNTER — Other Ambulatory Visit: Payer: Self-pay | Admitting: Cardiology

## 2016-05-13 ENCOUNTER — Other Ambulatory Visit: Payer: Self-pay | Admitting: Internal Medicine

## 2016-05-18 ENCOUNTER — Other Ambulatory Visit: Payer: Self-pay | Admitting: Internal Medicine

## 2016-05-18 ENCOUNTER — Ambulatory Visit: Payer: Medicare Other | Admitting: Surgery

## 2016-05-18 ENCOUNTER — Other Ambulatory Visit: Payer: Medicare Other

## 2016-05-27 ENCOUNTER — Other Ambulatory Visit: Payer: Self-pay | Admitting: Internal Medicine

## 2016-06-01 ENCOUNTER — Encounter: Payer: Self-pay | Admitting: Surgery

## 2016-06-01 ENCOUNTER — Ambulatory Visit (INDEPENDENT_AMBULATORY_CARE_PROVIDER_SITE_OTHER): Payer: Medicare Other | Admitting: Surgery

## 2016-06-01 ENCOUNTER — Ambulatory Visit
Admission: RE | Admit: 2016-06-01 | Discharge: 2016-06-01 | Disposition: A | Discharge status: Home / Self Care | Payer: Medicare Other | Source: Ambulatory Visit | Attending: Surgery | Admitting: Surgery

## 2016-06-01 VITALS — BP 159/69 | HR 63 | Resp 20 | Ht 63.0 in | Wt 163.0 lb

## 2016-06-01 DIAGNOSIS — I712 Thoracic aortic aneurysm, without rupture, unspecified: Secondary | ICD-10-CM

## 2016-06-01 MED ORDER — IOPAMIDOL (ISOVUE-370) INJECTION 76%
75.0000 mL | Freq: Once | INTRAVENOUS | Status: AC | PRN
  Administered 2016-06-01: 75 mL via INTRAVENOUS

## 2016-06-01 NOTE — Progress Notes (Signed)
HPI:  She returns today for follow up of a 4.6 cm fusiform ascending aortic aneurysm noted on CT done 04/2015 when she presented with SOB. I last saw her on 04/30/2015 for an initial evaluation and the aorta had not changed much compared to a CT scan from 2002 when it was 4.2 cm. She has been stable over the past year. She denies any chest or back pain.  Current Outpatient Prescriptions  Medication Sig Dispense Refill  . acetaminophen (TYLENOL) 500 MG tablet Take 500 mg by mouth every 6 (six) hours as needed for mild pain, moderate pain, fever or headache.    . ALPRAZolam (XANAX) 0.5 MG tablet Take 0.5 mg by mouth at bedtime.     Marland Kitchen atenolol (TENORMIN) 50 MG tablet TAKE 1 TABLET BY MOUTH EVERY DAY 30 tablet 6  . Cholecalciferol (VITAMIN D-3) 1000 UNITS CAPS Take 3 capsules by mouth at bedtime.     . Coenzyme Q10 100 MG capsule Take 100 mg by mouth daily.      . furosemide (LASIX) 20 MG tablet Take 20 mg by mouth daily as needed (for swelling).     . Insulin Glargine (LANTUS SOLOSTAR) 100 UNIT/ML SOPN Inject 30 Units into the skin daily with breakfast.     . Multiple Vitamin (MULTIVITAMIN) tablet Take 1 tablet by mouth daily.      . pantoprazole (PROTONIX) 40 MG tablet TAKE 1 TABLET EVERY DAY 30 tablet 0  . spironolactone (ALDACTONE) 25 MG tablet Take 0.5 tablets (12.5 mg total) by mouth daily. 15 tablet 11  . trandolapril (MAVIK) 4 MG tablet Take 1 tablet (4 mg total) by mouth 2 (two) times daily. 180 tablet 2   No current facility-administered medications for this visit.    Facility-Administered Medications Ordered in Other Visits  Medication Dose Route Frequency Provider Last Rate Last Dose  . regadenoson (LEXISCAN) injection SOLN 0.4 mg  0.4 mg Intravenous Once Josue Hector, MD      . technetium sestamibi generic (CARDIOLITE) injection 11 milli Curie  11 millicurie Intravenous Once PRN Josue Hector, MD      . technetium sestamibi generic (CARDIOLITE) injection 33 milli Curie  33  millicurie Intravenous Once PRN Josue Hector, MD         Physical Exam: BP (!) 159/69   Pulse 63   Resp 20   Ht 5\' 3"  (1.6 m)   Wt 163 lb (73.9 kg)   SpO2 98% Comment: RA  BMI 28.87 kg/m  She looks well for her age. Still walking with a walker due to polyneuropathy. Lungs are clear Cardiac exam shows a regular rate and rhythm with normal heart sounds and no murmur.  Diagnostic Tests:  CLINICAL DATA:  Thoracic aortic aneurysm without rupture.  EXAM: CT ANGIOGRAPHY CHEST WITH CONTRAST  TECHNIQUE: Multidetector CT imaging of the chest was performed using the standard protocol during bolus administration of intravenous contrast. Multiplanar CT image reconstructions and MIPs were obtained to evaluate the vascular anatomy.  CONTRAST:  75 mL of Isovue 370 intravenously.  COMPARISON:  CT scan of January 31, 2016.  FINDINGS: Cardiovascular: 4.2 cm ascending thoracic aortic aneurysm is noted based on my own measurements of prior study. Atherosclerosis of thoracic aorta is noted without dissection. No pericardial effusion is noted. Mild cardiomegaly is noted.  Mediastinum/Nodes: No enlarged mediastinal, hilar, or axillary lymph nodes. Thyroid gland, trachea, and esophagus demonstrate no significant findings.  Lungs/Pleura: Lungs are clear. No pleural effusion or pneumothorax.  Upper Abdomen: No acute abnormality.  Musculoskeletal: No chest wall abnormality. No acute or significant osseous findings.  Review of the MIP images confirms the above findings.  IMPRESSION: Stable 4.2 cm ascending thoracic aortic aneurysm. Recommend annual imaging followup by CTA or MRA. This recommendation follows 2010 ACCF/AHA/AATS/ACR/ASA/SCA/SCAI/SIR/STS/SVM Guidelines for the Diagnosis and Management of Patients with Thoracic Aortic Disease. Circulation. 2010; 121: D638-V564.   Electronically Signed   By: Marijo Conception, M.D.   On: 06/01/2016  13:57   Impression:  She has a stable 4.2 cm fusiform ascending aortic aneurysm. The measurements have varied from 4.2 -4.6 cm but it has been fairly stable dating back to 2002 and the different measurements are probably due to technique. She does not require any surgical therapy at this time. I reviewed the images with her and her husband and answered their questions.  I stressed the importance of a low sodium diet and good blood pressure control.  Plan:  I will see her back in one year with a CTA of the chest.   Gaye Pollack, MD [facility] [phone]

## 2016-06-03 ENCOUNTER — Other Ambulatory Visit: Payer: Self-pay | Admitting: Internal Medicine

## 2016-06-03 NOTE — Telephone Encounter (Signed)
I called and made [name] an appointment with Dr Carlean Purl as we have not seen her since 2015.  I sent in her pantoprazole to cover her until her appointment.

## 2016-06-06 ENCOUNTER — Telehealth: Payer: Self-pay | Admitting: Cardiology

## 2016-06-06 NOTE — Telephone Encounter (Signed)
New message   Patient calling had a heart scan with Dr. Cyndia Bent on last Wednesday.    Pt c/o medication issue:  1. Name of Medication: atenolol (TENORMIN) 50 MG tablet  2. How are you currently taking this medication (dosage and times per day)? Once a day   3. Are you having a reaction (difficulty breathing--STAT)? No   4. What is your medication issue? fingers on right hand are  blue./ middle finger is totally blue. Left hand is normal.

## 2016-06-06 NOTE — Telephone Encounter (Signed)
Patient reports discoloration of her entire R middle finger since Friday, 5/4. Initially, her other fingers on her R hand were blue as well, but only on the ends/tips. The discoloration left the other fingers Friday but the middle finger is still entirely blue today. She states it is totally painless, she is not cold, and nothing is impeding blood flow (she does not wear rings). She denies any recent trauma. She does report that it is the same hand she had surgery on about 4 years ago and "it hasn't felt the same since," but this is the first time it has ever turned blue.  She is concerned that her atenolol could be the cause of the discoloration. Instructed her to see her PCP for evaluation prior to changing her heart medication as she likes the atenolol and this is the first potential side effect that has occurred. She understands she will be called if Cardiology has further recommendations at this time. She will call after PCP evaluation for update. She was grateful for call and agrees with treatment plan.

## 2016-06-08 NOTE — Telephone Encounter (Signed)
Patient states the etiology of discoloration of finger was not known because the finger returned to normal before the PCP could see. She will make ASAP appointment with PCP if it occurs again. She was grateful for follow-up.

## 2016-06-08 NOTE — Telephone Encounter (Signed)
Follow up    It is not the medication that is turning her finger purple , she said to give you the note

## 2016-06-15 ENCOUNTER — Encounter: Payer: Self-pay | Admitting: Internal Medicine

## 2016-06-15 ENCOUNTER — Ambulatory Visit (INDEPENDENT_AMBULATORY_CARE_PROVIDER_SITE_OTHER): Payer: Medicare Other | Admitting: Internal Medicine

## 2016-06-15 VITALS — BP 122/70 | HR 56 | Ht 63.0 in | Wt 163.6 lb

## 2016-06-15 DIAGNOSIS — K5730 Diverticulosis of large intestine without perforation or abscess without bleeding: Secondary | ICD-10-CM

## 2016-06-15 DIAGNOSIS — K219 Gastro-esophageal reflux disease without esophagitis: Secondary | ICD-10-CM

## 2016-06-15 MED ORDER — GLYCOPYRROLATE 2 MG PO TABS: 2.0000 mg | ORAL_TABLET | Freq: Two times a day (BID) | ORAL | 3 refills | Status: DC | PRN

## 2016-06-15 MED ORDER — PANTOPRAZOLE SODIUM 40 MG PO TBEC: 40.0000 mg | DELAYED_RELEASE_TABLET | Freq: Every day | ORAL | 11 refills | Status: DC

## 2016-06-15 NOTE — Progress Notes (Signed)
   [name] 83 y.o. 06/25/1932 [id number]  Assessment & Plan:   Encounter Diagnoses  Name Primary?  . Gastroesophageal reflux disease, esophagitis presence not specified Yes  . Symptomatic Diverticulosis of colon without hemorrhage    Refill pantoprazole and Rx glycopyrrolate 2 mg bid prn RTC 1-2 yrs   Subjective:   Chief Complaint: f/u GERD, LLQ pain  HPI  Doing well overall - now using a walker as she has neuropathy and hx fall. GERD ok Still gets LLQ pain at times but not for more than a few days. Would like refill of glycopyrrolate - has used with hep in the past Medications, allergies, past medical history, past surgical history, family history and social history are reviewed and updated in the EMR.  Review of Systems  As above Objective:   Physical Exam BP 122/70   Pulse (!) 56   Ht 5\' 3"  (1.6 m)   Wt 163 lb 9.6 oz (74.2 kg)   BMI 28.98 kg/m  NAD  15 minutes time spent with patient > half in counseling coordination of care

## 2016-06-15 NOTE — Patient Instructions (Addendum)
We have sent the following medications to your pharmacy for you to pick up at your convenience: Pantoprazole, glycopyrrolate   I appreciate the opportunity to care for you. Silvano Rusk, MD, [facility]

## 2016-07-22 ENCOUNTER — Other Ambulatory Visit: Payer: Self-pay

## 2016-07-22 MED ORDER — GLYCOPYRROLATE 2 MG PO TABS: 2.0000 mg | ORAL_TABLET | Freq: Two times a day (BID) | ORAL | 3 refills | Status: DC | PRN

## 2016-07-22 NOTE — Telephone Encounter (Signed)
90 day supply of glycopyrrolate tablets sent in as requested, pt seen in May 2018.

## 2016-11-14 ENCOUNTER — Telehealth: Payer: Self-pay | Admitting: Physician Assistant

## 2016-11-14 NOTE — Telephone Encounter (Signed)
Pt c/o BP issue: STAT if pt c/o blurred vision, one-sided weakness or slurred speech  1. What are your last 5 BP readings? 160/80 2. Are you having any other symptoms (ex. Dizziness, headache, blurred vision, passed out)? no  3. What is your BP issue? High she want to know what medications should she take

## 2016-11-14 NOTE — Telephone Encounter (Signed)
Pt states she went to PCP last week-- BP there was 160/72. Pt states PCP told her to check BP at home (pt states she checks BP daily at home)  and call back if BP continued to be elevated. Pt states BP this AM was 161/84, HR in 50 s before medications, she has not checked BP since she took medication this morning. Pt states she has had increased stress recently due to husband new medical problems (CVA), her power out for 2 days, unable to follow usual diet,etc.--her power is back on now.  Pt denies any symptoms, including headache, lightheadedness/dizziness.  Pt states BP had been okay until this past week or so.  Pt advised to continue to monitor BP and heart rate for a couple more days, if it continues to stay elevated contact PCP. Pt states she is comfortable with this plan, thanked me for call. Pt  did make appointment here in November-I asked pt to bring log of BP readings to that appointment, call before then if she needs additional help with BP management.

## 2016-11-14 NOTE — Telephone Encounter (Signed)
Close Encounter 

## 2016-11-18 ENCOUNTER — Encounter: Payer: Self-pay | Admitting: Physician Assistant

## 2016-11-18 ENCOUNTER — Other Ambulatory Visit: Payer: Self-pay | Admitting: Cardiology

## 2016-12-06 ENCOUNTER — Ambulatory Visit: Payer: Medicare Other | Admitting: Physician Assistant

## 2016-12-17 ENCOUNTER — Other Ambulatory Visit: Payer: Self-pay | Admitting: Nurse Practitioner

## 2016-12-26 ENCOUNTER — Encounter: Payer: Self-pay | Admitting: Physician Assistant

## 2016-12-26 ENCOUNTER — Ambulatory Visit: Payer: Medicare Other | Admitting: Physician Assistant

## 2016-12-26 VITALS — BP 132/72 | HR 74 | Resp 16 | Ht 62.5 in | Wt 160.8 lb

## 2016-12-26 DIAGNOSIS — I10 Essential (primary) hypertension: Secondary | ICD-10-CM

## 2016-12-26 DIAGNOSIS — I671 Cerebral aneurysm, nonruptured: Secondary | ICD-10-CM | POA: Diagnosis not present

## 2016-12-26 DIAGNOSIS — I712 Thoracic aortic aneurysm, without rupture: Secondary | ICD-10-CM

## 2016-12-26 DIAGNOSIS — R55 Syncope and collapse: Secondary | ICD-10-CM

## 2016-12-26 DIAGNOSIS — G459 Transient cerebral ischemic attack, unspecified: Secondary | ICD-10-CM | POA: Diagnosis not present

## 2016-12-26 DIAGNOSIS — I7121 Aneurysm of the ascending aorta, without rupture: Secondary | ICD-10-CM

## 2016-12-26 NOTE — Patient Instructions (Addendum)
Medication Instructions:  Your physician recommends that you continue on your current medications as directed. Please refer to the Current Medication list given to you today.   Labwork: TODAY BMET, CBC  Testing/Procedures: 1. Your physician has recommended that you wear a holter monitor. Holter monitors are medical devices that record the heart's electrical activity. Doctors most often use these monitors to diagnose arrhythmias. Arrhythmias are problems with the speed or rhythm of the heartbeat. The monitor is a small, portable device. You can wear one while you do your normal daily activities. This is usually used to diagnose what is causing palpitations/syncope (passing out). TO BE DONE AFTER 02/13/17  2. Your physician has requested that you have a carotid duplex. This test is an ultrasound of the carotid arteries in your neck. It looks at blood flow through these arteries that supply the brain with blood. Allow one hour for this exam. There are no restrictions or special instructions. TO BE DONE AFTER 02/13/17    Follow-Up: DR. Radford Pax SOMETIME AFTER 02/13/17 PER PT REQUEST  Any Other Special Instructions Will Be Listed Below (If Applicable). MAKE SURE TO FOLLOW UP WITH DR. Estanislado Pandy ABOUT TIA    If you need a refill on your cardiac medications before your next appointment, please call your pharmacy.

## 2016-12-26 NOTE — Progress Notes (Signed)
Cardiology Office Note    Date:  12/26/2016   ID:  [name], DOB [birth year], MRN [id number]  PCP:  Antony Contras, MD  Cardiologist: Dr. Fransico Him  Chief Complaint  Patient presents with  . Hypertension    dizziness, passing out,     History of Present Illness:  [name] is a 83 y.o. female with history of stable thoracic aneurysm followed by Dr. Caffie Pinto, hypertension, dyslipidemia, MVP, DM, PUD with hiatal hernia, and cerebral aneurysm status post coiling.  Patient last saw Dr. Radford Pax 12/2015 complaining of shortness of breath.  She had normal LV function on 2D echo 03/7900 and diastolic dysfunction.  She also had a lot of stress and anxiety related to her husband's dementia and deconditioning status.  Blood pressure was also elevated and she has been seen by her pharmacist for titration of medications.  Patient here for yearly check up. She says she had a TIA 2 weeks ago with inability to speak, numbness of face at night and never went to Dr b/c she's elderly and hard to get anywhere. Last week she bent down to get laundry and when she stood up she passed out. Has some bruises on shoulder and tailbone. She complains of weakness. Similar to when she was hospitalized last spring.  She says all her heart tests were normal and thinks it is just because she bent over and got up quickly.  She has no help at home and has trouble getting transportation and cannot leave her husband who has dementia.  He takes blood pressure and pulse twice a day and they have been stable with blood pressures 130-140/70-80 and pulse in the 50s.  Past Medical History:  Diagnosis Date  . Abdominal pain, chronic, left lower quadrant   . Acute torn meniscus of knee   . Adenomatous colon polyp 1970    carcinoma in situ  . Allergic rhinitis   . Amaurosis fugax 08/07/2012  . Anxiety   . Ascending aortic aneurysm ([place]) 12/07/2015  . Benign essential tremor syndrome   . colon ca dx'd 1970   surg only  .  DDD (degenerative disc disease)   . Diverticulitis   . Diverticulosis   . DM (diabetes mellitus) ([place])   . Duodenitis    peptic, with gastric heterotopia  . Endometriosis    s/p hysterectomy  . Fundic gland polyposis of stomach   . GERD (gastroesophageal reflux disease)   . Heart murmur   . Hiatal hernia 02/03/05  . History of cerebral aneurysm repair    s/p coiling  . History of hemorrhoids    with bleeding  . History of shingles   . HLD (hyperlipidemia)   . HTN (hypertension)   . Hypercholesteremia   . IBS (irritable bowel syndrome)   . Iron deficiency   . Migraine   . MVP (mitral valve prolapse)   . Osteopenia   . Peripheral neuropathy   . Toe fracture, right    second toe  . UTI (lower urinary tract infection)   . Varicose vein     Past Surgical History:  Procedure Laterality Date  . ABDOMINAL HYSTERECTOMY    . ABDOMINAL HYSTERECTOMY    . APPENDECTOMY    . CARPAL TUNNEL RELEASE  08/26/07  . CATARACT EXTRACTION    . CEREBRAL ANEURYSM REPAIR    . CEREBRAL ANEURYSM REPAIR    . COLON RESECTION    . COLONOSCOPY  06/07/08   divertiulosis, internal hemorrhoids  . COLONOSCOPY W/  BIOPSIES AND POLYPECTOMY  02/03/05   diverticulosis, 4 mm sessile polyps, internal and external hemorrhoids  . CORONARY ANGIOPLASTY WITH STENT PLACEMENT    . ESOPHAGOGASTRODUODENOSCOPY  02/03/05   hiatal hernia, 6 benign gastric polyps  . FLEXIBLE SIGMOIDOSCOPY    . HAND SURGERY Right   . INTRAOCULAR LENS INSERTION    . right hand decompressive fasciotomy  08/26/07   , dorsal and volar  . TONSILLECTOMY      Current Medications: No outpatient medications have been marked as taking for the 12/26/16 encounter (Office Visit) with Imogene Burn, PA-C.     Allergies:   Actos [pioglitazone]; Avandia [rosiglitazone]; Fosamax [alendronate sodium]; Ultram [tramadol]; Glimepiride; Januvia [sitagliptin]; Lipitor [atorvastatin]; Metformin and related; Prandin [repaglinide]; Tradjenta [linagliptin]; Zoloft  [sertraline hcl]; Aspirin; Bentyl [dicyclomine hcl]; Ciprofloxacin; Hctz [hydrochlorothiazide]; Keflex [cephalexin]; Morphine and related; and Norvasc [amlodipine besylate]   Social History   Socioeconomic History  . Marital status: Married    Spouse name: None  . Number of children: 0  . Years of education: None  . Highest education level: None  Social Needs  . Financial resource strain: None  . Food insecurity - worry: None  . Food insecurity - inability: None  . Transportation needs - medical: None  . Transportation needs - non-medical: None  Occupational History  . Occupation: retired    Fish farm manager: RETIRED  Tobacco Use  . Smoking status: Never Smoker  . Smokeless tobacco: Never Used  Substance and Sexual Activity  . Alcohol use: No  . Drug use: No  . Sexual activity: None  Other Topics Concern  . None  Social History Narrative  . None     Family History:  The patient's family history includes Colon cancer in her unknown relative; Diabetes in her brother, brother, paternal grandmother, and unknown relative; Heart disease in her brother, brother, and father; Hypertension in her brother; Kidney disease in her father; Microcephaly in her brother; Ovarian cancer in her mother.   ROS:   Please see the history of present illness.    Review of Systems  Constitution: Negative.  HENT: Negative.   Eyes: Negative.   Cardiovascular: Negative.   Respiratory: Negative.   Hematologic/Lymphatic: Negative.   Musculoskeletal: Negative.  Negative for joint pain.  Gastrointestinal: Negative.   Genitourinary: Negative.   Neurological: Positive for dizziness, headaches and numbness.   All other systems reviewed and are negative.   PHYSICAL EXAM:   VS:  BP 132/72   Pulse 74   Resp 16   Ht 5' 2.5" (1.588 m)   Wt 160 lb 12.8 oz (72.9 kg)   SpO2 92%   BMI 28.94 kg/m   Physical Exam  GEN: Well nourished, well developed, in no acute distress  Neck: no JVD, carotid bruits, or  masses Cardiac:RRR; no murmurs, rubs, or gallops  Respiratory:  clear to auscultation bilaterally, normal work of breathing GI: soft, nontender, nondistended, + BS Ext: without cyanosis, clubbing, or edema, Good distal pulses bilaterally Neuro:  Alert and Oriented x 3 Psych: euthymic mood, full affect  Wt Readings from Last 3 Encounters:  12/26/16 160 lb 12.8 oz (72.9 kg)  06/15/16 163 lb 9.6 oz (74.2 kg)  06/01/16 163 lb (73.9 kg)      Studies/Labs Reviewed:   EKG:  EKG is  ordered today.  The ekg ordered today demonstrates normal sinus rhythm, normal EKG  Recent Labs: 01/31/2016: Hemoglobin 11.3; Platelets 195 02/16/2016: BUN 17; Creatinine, Ser 0.85; Potassium 4.3; Sodium 143   Lipid Panel  Component Value Date/Time   CHOL  08/27/2007 0354    151        ATP III CLASSIFICATION:  <200     mg/dL   Desirable  200-239  mg/dL   Borderline High  >=240    mg/dL   High   TRIG 82 08/27/2007 0354   HDL 24 (L) 08/27/2007 0354   CHOLHDL 6.3 08/27/2007 0354   VLDL 16 08/27/2007 0354   LDLCALC (H) 08/27/2007 0354    111        Total Cholesterol/HDL:CHD Risk Coronary Heart Disease Risk Table                     Men   Women  1/2 Average Risk   3.4   3.3    Additional studies/ records that were reviewed today include:  EXAM: CT ANGIOGRAPHY CHEST WITH CONTRAST 05/2016   TECHNIQUE: Multidetector CT imaging of the chest was performed using the standard protocol during bolus administration of intravenous contrast. Multiplanar CT image reconstructions and MIPs were obtained to evaluate the vascular anatomy.   CONTRAST:  75 mL of Isovue 370 intravenously.   COMPARISON:  CT scan of January 31, 2016.   FINDINGS: Cardiovascular: 4.2 cm ascending thoracic aortic aneurysm is noted based on my own measurements of prior study. Atherosclerosis of thoracic aorta is noted without dissection. No pericardial effusion is noted. Mild cardiomegaly is noted.   Mediastinum/Nodes: No  enlarged mediastinal, hilar, or axillary lymph nodes. Thyroid gland, trachea, and esophagus demonstrate no significant findings.   Lungs/Pleura: Lungs are clear. No pleural effusion or pneumothorax.   Upper Abdomen: No acute abnormality.   Musculoskeletal: No chest wall abnormality. No acute or significant osseous findings.   Review of the MIP images confirms the above findings.   IMPRESSION: Stable 4.2 cm ascending thoracic aortic aneurysm. Recommend annual imaging followup by CTA or MRA. This recommendation follows 2010 ACCF/AHA/AATS/ACR/ASA/SCA/SCAI/SIR/STS/SVM Guidelines for the Diagnosis and Management of Patients with Thoracic Aortic Disease. Circulation. 2010; 121: K998-P382.     Electronically Signed   By: Marijo Conception, M.D.   On: 06/01/2016 13:57   Carotid Dopplers 2017Summary: Findings consistent with 1- 39 percent stenosis involving the right internal carotid artery and the left internal carotid artery.  Other specific details can be found in the table(s) above. Prepared and Electronically Authenticated by  Tinnie Gens MD 2017-03-15T13:00:28   Nuclear stress test 4/17/17Study Highlights    The left ventricular ejection fraction is normal (55-65%).  Nuclear stress EF: 71%.  There was no ST segment deviation noted during stress.  The study is normal.  This is a low risk study.   Normal resting and stress perfusion no ischemia or infarction EF 64%       2D echo 4/17/17Study Conclusions   - Left ventricle: The cavity size was normal. Wall thickness was   increased in a pattern of mild LVH. Systolic function was normal.   The estimated ejection fraction was in the range of 55% to 60%.   Wall motion was normal; there were no regional wall motion   abnormalities. Doppler parameters are consistent with abnormal   left ventricular relaxation (grade 1 diastolic dysfunction). - Aortic valve: There was no stenosis. There was trivial   regurgitation. -  Aorta: Mildly dilated aortic root and ascending aorta. Aortic   root dimension: 39 mm (ED). Ascending aortic diameter: 42 mm (S). - Mitral valve: There was trivial regurgitation. - Left atrium: The atrium was mildly dilated. -  Right ventricle: The cavity size was normal. Systolic function   was normal. - Tricuspid valve: Peak RV-RA gradient (S): 30 mm Hg. - Pulmonary arteries: PA peak pressure: 33 mm Hg (S). - Inferior vena cava: The vessel was normal in size. The   respirophasic diameter changes were in the normal range (>= 50%),   consistent with normal central venous pressure.   Impressions:   - Normal LV size with mild LV hypertrophy. EF 55-60%. Normal RV   size and systolic function. No significant valvular   abnormalities.     ASSESSMENT:    1. TIA (transient ischemic attack)   2. Vasovagal syncope   3. Essential hypertension   4. Aneurysm, cerebral, nonruptured   5. Ascending aortic aneurysm (HCC)      PLAN:  In order of problems listed above:  TIA 2 weeks ago while in bed with inability to speak and facial numbness that eventually resolved on its own.  Patient did not seek medical attention because she could not leave her husband with dementia.  She has had aneurysms with coiling and TIAs in the past.  Needs to follow-up with Dr. Estanislado Pandy.  Syncope last week when bending over and standing up quickly she refuses to get on the examining table for [place] to do orthostatics.  Blood pressures at home have all been stable pulses in the 50s.  Recommend 48-hour monitor and carotid Dopplers.  Patient says she cannot have anything done until after January 14 because she has no one to bring her here.  She thinks it is just because she has been over and stood up quickly which could certainly be the reason but want to make sure nothing else such as arrhythmias going on.  Follow-up with Dr. Radford Pax in January.  Normal 2D echo and stress Myoview 05/2015.  Will check labs today to be sure there  is no metabolic reason for her syncope.  Essential hypertension patient takes her blood pressure daily and it has been stable.  Continue Tenormin, Aldactone and Lasix as needed  History of cerebral aneurysm with coiling in the past and recent symptoms of TIA.  Follow-up with neurology recommended.  Ascending aortic aneurysm has been stable at 4.2 cm on most recent CT 05/2016 followed by Dr. Caffie Pinto.  Medication Adjustments/Labs and Tests Ordered: Current medicines are reviewed at length with the patient today.  Concerns regarding medicines are outlined above.  Medication changes, Labs and Tests ordered today are listed in the Patient Instructions below. Patient Instructions  Medication Instructions:  Your physician recommends that you continue on your current medications as directed. Please refer to the Current Medication list given to you today.   Labwork: TODAY BMET, CBC  Testing/Procedures: 1. Your physician has recommended that you wear a holter monitor. Holter monitors are medical devices that record the heart's electrical activity. Doctors most often use these monitors to diagnose arrhythmias. Arrhythmias are problems with the speed or rhythm of the heartbeat. The monitor is a small, portable device. You can wear one while you do your normal daily activities. This is usually used to diagnose what is causing palpitations/syncope (passing out). TO BE DONE AFTER 02/13/17  2. Your physician has requested that you have a carotid duplex. This test is an ultrasound of the carotid arteries in your neck. It looks at blood flow through these arteries that supply the brain with blood. Allow one hour for this exam. There are no restrictions or special instructions. TO BE DONE AFTER 02/13/17  Follow-Up: DR. Radford Pax SOMETIME AFTER 02/13/17 PER PT REQUEST  Any Other Special Instructions Will Be Listed Below (If Applicable).     If you need a refill on your cardiac medications before your next  appointment, please call your pharmacy.      Signed, Ermalinda Barrios, PA-C  12/26/2016 1:26 PM    [facility] [address], [place], [place]  [address] Phone: [phone]; Fax: [phone]

## 2016-12-27 LAB — BASIC METABOLIC PANEL
BUN / CREAT RATIO: 28 (ref 12–28)
BUN: 27 mg/dL (ref 8–27)
CALCIUM: 9.2 mg/dL (ref 8.7–10.3)
CO2: 21 mmol/L (ref 20–29)
Chloride: 102 mmol/L (ref 96–106)
Creatinine, Ser: 0.98 mg/dL (ref 0.57–1.00)
GFR calc Af Amer: 62 mL/min/{1.73_m2} (ref >59)
GFR, EST NON AFRICAN AMERICAN: 54 mL/min/{1.73_m2} — AB (ref >59)
Glucose: 156 mg/dL — ABNORMAL HIGH (ref 65–99)
POTASSIUM: 4.6 mmol/L (ref 3.5–5.2)
SODIUM: 140 mmol/L (ref 134–144)

## 2016-12-27 LAB — CBC
HEMATOCRIT: 38.6 % (ref 34.0–46.6)
Hemoglobin: 12.1 g/dL (ref 11.1–15.9)
MCH: 23.5 pg — AB (ref 26.6–33.0)
MCHC: 31.3 g/dL — ABNORMAL LOW (ref 31.5–35.7)
MCV: 75 fL — ABNORMAL LOW (ref 79–97)
Platelets: 234 10*3/uL (ref 150–379)
RBC: 5.14 x10E6/uL (ref 3.77–5.28)
RDW: 17 % — AB (ref 12.3–15.4)
WBC: 12.7 10*3/uL — AB (ref 3.4–10.8)

## 2017-01-19 ENCOUNTER — Other Ambulatory Visit: Payer: Self-pay | Admitting: Cardiology

## 2017-03-03 ENCOUNTER — Telehealth: Payer: Self-pay | Admitting: Cardiology

## 2017-03-03 NOTE — Telephone Encounter (Signed)
Returned call to patient. Patient states she has a little sore on her leg and has been keeping her legs elevated, patient denies any other symptoms. Informed patient to follow up with primary MD. Patient in agreement with plan and thanked me for the call.

## 2017-03-03 NOTE — Telephone Encounter (Signed)
Patient calling,  Patient has a sore place on varicose vein in right leg. Patient is concerned because she  has a heart aneurysm.

## 2017-03-14 ENCOUNTER — Ambulatory Visit (HOSPITAL_COMMUNITY)
Admission: RE | Admit: 2017-03-14 | Payer: Medicare Other | Source: Ambulatory Visit | Attending: Physician Assistant | Admitting: Physician Assistant

## 2017-03-16 ENCOUNTER — Encounter: Payer: Self-pay | Admitting: Cardiology

## 2017-03-23 ENCOUNTER — Encounter: Payer: Self-pay | Admitting: Cardiology

## 2017-03-23 ENCOUNTER — Ambulatory Visit: Payer: Medicare Other | Admitting: Cardiology

## 2017-03-23 VITALS — BP 132/66 | HR 76 | Ht 62.5 in | Wt 145.6 lb

## 2017-03-23 DIAGNOSIS — G459 Transient cerebral ischemic attack, unspecified: Secondary | ICD-10-CM

## 2017-03-23 DIAGNOSIS — I712 Thoracic aortic aneurysm, without rupture: Secondary | ICD-10-CM

## 2017-03-23 DIAGNOSIS — I10 Essential (primary) hypertension: Secondary | ICD-10-CM

## 2017-03-23 DIAGNOSIS — G453 Amaurosis fugax: Secondary | ICD-10-CM | POA: Diagnosis not present

## 2017-03-23 DIAGNOSIS — R002 Palpitations: Secondary | ICD-10-CM | POA: Diagnosis not present

## 2017-03-23 DIAGNOSIS — I7121 Aneurysm of the ascending aorta, without rupture: Secondary | ICD-10-CM

## 2017-03-23 NOTE — Progress Notes (Signed)
Cardiology Office Note:    Date:  03/23/2017   ID:  [name], DOB 08-10-1932, MRN [id number]  PCP:  Antony Contras, MD  Cardiologist:  No primary care provider on file.    Referring MD: Antony Contras, MD   Chief Complaint  Patient presents with  . Hypertension  . Shortness of Breath  . Chest Pain    History of Present Illness:    [name] is a 84 y.o. female with a hx of HTN, dyslipdiemia, MVP, PUD with hiatal hernia and Type II DM and thoracic ascending aortic aneurysm at 4.6cm by chest CT angio 2017 and 4.2cm by CT angio 2018. She also has a history of cerebral aneurysm s/p coiling.  She has chronic CP and SOB with no ischemia no nuclear stress test and normal LVF on echo.   She also had a lot of stress and anxiety related to her husband's dementia and deconditioning status.  She had a TIA last November with inability to speak, numbness of face at night and never went to Dr b/c she's elderly and hard to get anywhere.  She also had an episode of syncope after bending down and standing up too fast.  She saw Estella Husk, PA and she ordered an event monitor but patient at that time could not get it done.  She also was supposed to have carotid dopplers done which have not been scheduled yet. She refused to get on the exam table to have orthostatic BPs done.    She is here today for followup and is doing well.  She continues to have chronic chest pressure at night when she is laying in bed but does not think it is related to her heart.  She has chronic DOE which has not changed any recently.  She denies  PND, orthopnea, LE edema, dizziness or syncope. She feels occasional skipping of her heart beat with palpitation.  She is compliant with her meds and is tolerating meds with no SE.  She brought her BP readings in today which range from 130-150/70's at home.  She says that she is always stressed about taking care of her husband and got into an argument with her children in regards to  moving to assisted living.     Past Medical History:  Diagnosis Date  . Abdominal pain, chronic, left lower quadrant   . Acute torn meniscus of knee   . Adenomatous colon polyp 1970    carcinoma in situ  . Allergic rhinitis   . Amaurosis fugax 08/07/2012  . Anxiety   . Ascending aortic aneurysm ([place]) 12/07/2015  . Benign essential tremor syndrome   . colon ca dx'd 1970   surg only  . DDD (degenerative disc disease)   . Diverticulitis   . Diverticulosis   . DM (diabetes mellitus) ([place])   . Duodenitis    peptic, with gastric heterotopia  . Endometriosis    s/p hysterectomy  . Fundic gland polyposis of stomach   . GERD (gastroesophageal reflux disease)   . Heart murmur   . Hiatal hernia 02/03/05  . History of cerebral aneurysm repair    s/p coiling  . History of hemorrhoids    with bleeding  . History of shingles   . HLD (hyperlipidemia)   . HTN (hypertension)   . Hypercholesteremia   . IBS (irritable bowel syndrome)   . Iron deficiency   . Migraine   . MVP (mitral valve prolapse)   . Osteopenia   .  Peripheral neuropathy   . Toe fracture, right    second toe  . UTI (lower urinary tract infection)   . Varicose vein     Past Surgical History:  Procedure Laterality Date  . ABDOMINAL HYSTERECTOMY    . ABDOMINAL HYSTERECTOMY    . APPENDECTOMY    . CARPAL TUNNEL RELEASE  08/26/07  . CATARACT EXTRACTION    . CEREBRAL ANEURYSM REPAIR    . CEREBRAL ANEURYSM REPAIR    . COLON RESECTION    . COLONOSCOPY  06/07/08   divertiulosis, internal hemorrhoids  . COLONOSCOPY W/ BIOPSIES AND POLYPECTOMY  02/03/05   diverticulosis, 4 mm sessile polyps, internal and external hemorrhoids  . CORONARY ANGIOPLASTY WITH STENT PLACEMENT    . ESOPHAGOGASTRODUODENOSCOPY  02/03/05   hiatal hernia, 6 benign gastric polyps  . FLEXIBLE SIGMOIDOSCOPY    . HAND SURGERY Right   . INTRAOCULAR LENS INSERTION    . right hand decompressive fasciotomy  08/26/07   , dorsal and volar  . TONSILLECTOMY       Current Medications: Current Meds  Medication Sig  . acetaminophen (TYLENOL) 500 MG tablet Take 500 mg by mouth every 6 (six) hours as needed for mild pain, moderate pain, fever or headache.  . ALPRAZolam (XANAX) 0.5 MG tablet Take 0.5 mg by mouth at bedtime.   Marland Kitchen atenolol (TENORMIN) 50 MG tablet TAKE 1 TABLET BY MOUTH EVERY DAY  . Cholecalciferol (VITAMIN D-3) 1000 UNITS CAPS Take 3 capsules by mouth at bedtime.   . furosemide (LASIX) 20 MG tablet Take 20 mg by mouth daily as needed (for swelling).   . Insulin Glargine (LANTUS SOLOSTAR) 100 UNIT/ML SOPN Inject 30 Units into the skin daily with breakfast.   . Multiple Vitamin (MULTIVITAMIN) tablet Take 1 tablet by mouth daily.    . pantoprazole (PROTONIX) 40 MG tablet Take 1 tablet (40 mg total) by mouth daily before breakfast.  . spironolactone (ALDACTONE) 25 MG tablet TAKE 1/2 TABLET BY MOUTH EVERY DAY  . trandolapril (MAVIK) 4 MG tablet TAKE 1 TABLET (4 MG TOTAL) BY MOUTH 2 (TWO) TIMES DAILY.     Allergies:   Actos [pioglitazone]; Avandia [rosiglitazone]; Fosamax [alendronate sodium]; Morphine; Ultram [tramadol]; Glimepiride; Januvia [sitagliptin]; Lipitor [atorvastatin]; Metformin and related; Prandin [repaglinide]; Tradjenta [linagliptin]; Zoloft [sertraline hcl]; Aspirin; Bentyl [dicyclomine hcl]; Ciprofloxacin; Hctz [hydrochlorothiazide]; Keflex [cephalexin]; Morphine and related; Norvasc [amlodipine besylate]; Oxycodone; and Penicillins   Social History   Socioeconomic History  . Marital status: Married    Spouse name: None  . Number of children: 0  . Years of education: None  . Highest education level: None  Social Needs  . Financial resource strain: None  . Food insecurity - worry: None  . Food insecurity - inability: None  . Transportation needs - medical: None  . Transportation needs - non-medical: None  Occupational History  . Occupation: retired    Fish farm manager: RETIRED  Tobacco Use  . Smoking status: Never Smoker   . Smokeless tobacco: Never Used  Substance and Sexual Activity  . Alcohol use: No  . Drug use: No  . Sexual activity: None  Other Topics Concern  . None  Social History Narrative  . None     Family History: The patient's family history includes Colon cancer in her unknown relative; Diabetes in her brother, brother, paternal grandmother, and unknown relative; Heart disease in her brother, brother, and father; Hypertension in her brother; Kidney disease in her father; Microcephaly in her brother; Ovarian cancer in her mother.  ROS:   Please see the history of present illness.    ROS  All other systems reviewed and negative.   EKGs/Labs/Other Studies Reviewed:    The following studies were reviewed today: none  EKG:  EKG is not ordered today.  Recent Labs: 12/26/2016: BUN 27; Creatinine, Ser 0.98; Hemoglobin 12.1; Platelets 234; Potassium 4.6; Sodium 140   Recent Lipid Panel    Component Value Date/Time   CHOL  08/27/2007 0354    151        ATP III CLASSIFICATION:  <200     mg/dL   Desirable  200-239  mg/dL   Borderline High  >=240    mg/dL   High   TRIG 82 08/27/2007 0354   HDL 24 (L) 08/27/2007 0354   CHOLHDL 6.3 08/27/2007 0354   VLDL 16 08/27/2007 0354   LDLCALC (H) 08/27/2007 0354    111        Total Cholesterol/HDL:CHD Risk Coronary Heart Disease Risk Table                     Men   Women  1/2 Average Risk   3.4   3.3    Physical Exam:    VS:  BP 132/66   Pulse 76   Ht 5' 2.5" (1.588 m)   Wt 145 lb 9.6 oz (66 kg)   SpO2 98%   BMI 26.21 kg/m     Wt Readings from Last 3 Encounters:  03/23/17 145 lb 9.6 oz (66 kg)  12/26/16 160 lb 12.8 oz (72.9 kg)  06/15/16 163 lb 9.6 oz (74.2 kg)     GEN:  Well nourished, well developed in no acute distress HEENT: Normal NECK: No JVD; No carotid bruits LYMPHATICS: No lymphadenopathy CARDIAC: RRR, no murmurs, rubs, gallops RESPIRATORY:  Clear to auscultation without rales, wheezing or rhonchi  ABDOMEN: Soft,  non-tender, non-distended MUSCULOSKELETAL:  No edema; No deformity  SKIN: Warm and dry NEUROLOGIC:  Alert and oriented x 3 PSYCHIATRIC:  Normal affect   ASSESSMENT:    1. Ascending aortic aneurysm ([place])   2. Essential hypertension   3. Heart palpitations   4. Amaurosis fugax    PLAN:    In order of problems listed above:  1.  Ascending aortic aneurysm - measuring 4.2cm on Chest CT angio 05/2016.  This is consistent with prior echo findings. I will repeat an echo 05/2017 to reassess aortic aneurysm. She is statin intolerant.  2.  HTN - Bp is well controlled on exam today.  She will continue on Atenolol 50mg  daily, Trandolapril 4mg  daily and Spironolactone 25mg  daily.  Creatinine was 0.98 and potassium 4.6 in November 2018.  3.  Heart palpitations with a history of TIA 12/2016.  I will get an event monitor to rule out PAF.  4.  ?TIA - patient never sought evaluation until seen by my PA.  I will set her up for carotid dopplers, heart monitor. She cannot take ASA due to h/o bleeding ulcer   Medication Adjustments/Labs and Tests Ordered: Current medicines are reviewed at length with the patient today.  Concerns regarding medicines are outlined above.  No orders of the defined types were placed in this encounter.  No orders of the defined types were placed in this encounter.   Signed, Fransico Him, MD  03/23/2017 2:36 PM    [place]

## 2017-03-23 NOTE — Patient Instructions (Signed)
Medication Instructions:  Your physician recommends that you continue on your current medications as directed. Please refer to the Current Medication list given to you today.  Labwork: None ordered   Testing/Procedures: Your physician has requested that you have an echocardiogram in May 2019. Echocardiography is a painless test that uses sound waves to create images of your heart. It provides your doctor with information about the size and shape of your heart and how well your heart's chambers and valves are working. This procedure takes approximately one hour. There are no restrictions for this procedure.  Your physician has requested that you have a carotid duplex. This test is an ultrasound of the carotid arteries in your neck. It looks at blood flow through these arteries that supply the brain with blood. Allow one hour for this exam. There are no restrictions or special instructions.  Your physician has recommended that you wear an event monitor. Event monitors are medical devices that record the heart's electrical activity. Doctors most often [place] these monitors to diagnose arrhythmias. Arrhythmias are problems with the speed or rhythm of the heartbeat. The monitor is a small, portable device. You can wear one while you do your normal daily activities. This is usually used to diagnose what is causing palpitations/syncope (passing out).   Follow-Up: Your physician wants you to follow-up in: 1 year with Dr. Radford Pax.  You will receive a reminder letter in the mail two months in advance. If you don't receive a letter, please call our office to schedule the follow-up appointment.  Any Other Special Instructions Will Be Listed Below (If Applicable).    Thank you for choosing [facility], RN  [phone]  If you need a refill on your cardiac medications before your next appointment, please call your pharmacy.

## 2017-04-06 ENCOUNTER — Encounter: Payer: Self-pay | Admitting: Cardiology

## 2017-04-06 ENCOUNTER — Ambulatory Visit (HOSPITAL_COMMUNITY)
Admission: RE | Admit: 2017-04-06 | Discharge: 2017-04-06 | Disposition: A | Discharge status: Home / Self Care | Payer: Medicare Other | Source: Ambulatory Visit | Attending: Cardiovascular Disease | Admitting: Cardiovascular Disease

## 2017-04-06 DIAGNOSIS — G459 Transient cerebral ischemic attack, unspecified: Secondary | ICD-10-CM

## 2017-04-06 DIAGNOSIS — I6523 Occlusion and stenosis of bilateral carotid arteries: Secondary | ICD-10-CM | POA: Insufficient documentation

## 2017-04-06 DIAGNOSIS — I6529 Occlusion and stenosis of unspecified carotid artery: Secondary | ICD-10-CM | POA: Insufficient documentation

## 2017-04-11 ENCOUNTER — Other Ambulatory Visit (HOSPITAL_COMMUNITY): Payer: Medicare Other

## 2017-05-24 ENCOUNTER — Other Ambulatory Visit: Payer: Self-pay | Admitting: Surgery

## 2017-05-24 DIAGNOSIS — I712 Thoracic aortic aneurysm, without rupture, unspecified: Secondary | ICD-10-CM

## 2017-05-24 DIAGNOSIS — I7121 Aneurysm of the ascending aorta, without rupture: Secondary | ICD-10-CM

## 2017-05-31 ENCOUNTER — Other Ambulatory Visit: Payer: Self-pay | Admitting: Internal Medicine

## 2017-06-02 ENCOUNTER — Telehealth: Payer: Self-pay | Admitting: Cardiology

## 2017-06-02 NOTE — Telephone Encounter (Signed)
Patient states she is having a chest CTA ordered for 06/28/17 by Dr. Cyndia Bent. She would like to know if she could postpone getting an echo since she is getting the chest CTA to assess thoracic aortic aneurysm. Patient was scheduled for echo on 06/13/17. I informed patient I would send to Dr. Radford Pax for review. She verbalize understanding and thankful for the call.

## 2017-06-02 NOTE — Telephone Encounter (Signed)
New Message   Pt states she is scheduled for a ct scan from her other provided and would like to discuss. Please call

## 2017-06-05 NOTE — Telephone Encounter (Signed)
Echo can be cancelled

## 2017-06-05 NOTE — Telephone Encounter (Signed)
Called patient about Dr. Theodosia Blender recommendations. Cancelled patient's echo appointment. Patient verbalized understanding.

## 2017-06-13 ENCOUNTER — Other Ambulatory Visit (HOSPITAL_COMMUNITY): Payer: Medicare Other

## 2017-06-28 ENCOUNTER — Other Ambulatory Visit: Payer: Self-pay

## 2017-06-28 ENCOUNTER — Ambulatory Visit: Payer: Medicare Other | Admitting: Surgery

## 2017-06-28 ENCOUNTER — Ambulatory Visit
Admission: RE | Admit: 2017-06-28 | Discharge: 2017-06-28 | Disposition: A | Discharge status: Home / Self Care | Payer: Medicare Other | Source: Ambulatory Visit | Attending: Surgery | Admitting: Surgery

## 2017-06-28 ENCOUNTER — Encounter: Payer: Self-pay | Admitting: Surgery

## 2017-06-28 VITALS — BP 148/58 | HR 57 | Resp 18 | Ht 62.5 in | Wt 155.7 lb

## 2017-06-28 DIAGNOSIS — I712 Thoracic aortic aneurysm, without rupture, unspecified: Secondary | ICD-10-CM

## 2017-06-28 MED ORDER — IOPAMIDOL (ISOVUE-370) INJECTION 76%
75.0000 mL | Freq: Once | INTRAVENOUS | Status: AC | PRN
  Administered 2017-06-28: 75 mL via INTRAVENOUS

## 2017-06-28 NOTE — Progress Notes (Signed)
HPI:  The patient returns today for follow-up of a 4.2 cm fusiform ascending aortic aneurysm.  The measurements have varied from 4.2 to 4.6 cm dating back to 2002.  She has been feeling well without chest pain.  She does report some shortness of breath at times.  She has chronic bilateral lower extremity edema.  Current Outpatient Medications  Medication Sig Dispense Refill  . acetaminophen (TYLENOL) 500 MG tablet Take 500 mg by mouth every 6 (six) hours as needed for mild pain, moderate pain, fever or headache.    . ALPRAZolam (XANAX) 0.5 MG tablet Take 0.5 mg by mouth at bedtime.     Marland Kitchen atenolol (TENORMIN) 50 MG tablet TAKE 1 TABLET BY MOUTH EVERY DAY 30 tablet 11  . Cholecalciferol (VITAMIN D-3) 1000 UNITS CAPS Take 3 capsules by mouth at bedtime.     . furosemide (LASIX) 20 MG tablet Take 20 mg by mouth daily as needed (for swelling).     . Insulin Glargine (LANTUS SOLOSTAR) 100 UNIT/ML SOPN Inject 30 Units into the skin daily with breakfast.     . Multiple Vitamin (MULTIVITAMIN) tablet Take 1 tablet by mouth daily.      . pantoprazole (PROTONIX) 40 MG tablet TAKE 1 TABLET (40 MG TOTAL) BY MOUTH DAILY BEFORE BREAKFAST. 30 tablet 10  . spironolactone (ALDACTONE) 25 MG tablet TAKE 1/2 TABLET BY MOUTH EVERY DAY 15 tablet 11  . trandolapril (MAVIK) 4 MG tablet TAKE 1 TABLET (4 MG TOTAL) BY MOUTH 2 (TWO) TIMES DAILY. 180 tablet 2   No current facility-administered medications for this visit.      Physical Exam: BP (!) 148/58 (BP Location: Right Arm, Patient Position: Sitting, Cuff Size: Normal)   Pulse (!) 57   Resp 18   Ht 5' 2.5" (1.588 m)   Wt 155 lb 11.2 oz (70.6 kg)   SpO2 98% Comment: RA  BMI 28.02 kg/m  She looks well. Cardiac exam shows a regular rate and rhythm with normal heart sounds.  There is no murmur. Lungs are clear. There is moderate bilateral lower extremity edema worse on the right than the left.  Diagnostic Tests:  CLINICAL DATA:  84 year old female with  a history of thoracic aortic aneurysm.  Most remote chest CT for comparison is 04/25/2015  EXAM: CT ANGIOGRAPHY CHEST WITH CONTRAST  TECHNIQUE: Multidetector CT imaging of the chest was performed using the standard protocol during bolus administration of intravenous contrast. Multiplanar CT image reconstructions and MIPs were obtained to evaluate the vascular anatomy.  CONTRAST:  59mL ISOVUE-370 IOPAMIDOL (ISOVUE-370) INJECTION 76%  COMPARISON:  None.  FINDINGS: Cardiovascular: Heart size again enlarged. No pericardial fluid/thickening.  No significant calcifications of the coronary arteries, which opacify on the current study.  Best estimate of the annulus on the coronal reformatted images is 26 mm.  Best estimate of the sino-tubular junction on the coronal reformatted images is 32 mm.  Greatest diameter of the ascending thoracic aorta measured perpendicular to the flow channel is approximately 4.3 cm. This is essentially unchanged from the baseline CT of 04/25/2015 (comparison measurement made contemporaneously today on the CT of 2017).  Mild calcifications of the aortic arch. Three vessel arch with patent branch vessels cervical arteries are patent at the base of the neck with mild to moderate tortuosity.  Descending thoracic aorta demonstrates mild atherosclerotic changes. No dissection. No aneurysm. No periaortic fluid.  Greatest diameter of the main pulmonary artery measures approximately 2.8 cm.  The pulmonary arteries are somewhat  prominent in diameter in the outer 1/3 of the lung parenchyma, with pulmonary arteries somewhat out of proportion in diameter to the accompanying bronchi in the periphery of the lung, particularly lower lobes.  Mediastinum/Nodes: No mediastinal lymph nodes. Unremarkable course of the thoracic esophagus. Small hiatal hernia.  Lungs/Pleura: Mild geographic ground-glass opacity of the bilateral lungs. No confluent  airspace disease. No significant bronchial wall thickening. No pneumothorax or pleural effusion.  Upper Abdomen: No acute finding of the upper abdomen  Musculoskeletal: No acute displaced fracture. Multilevel degenerative changes of the thoracic spine.  Review of the MIP images confirms the above findings.  IMPRESSION: Redemonstration of ascending aortic aneurysm, which is essentially unchanged from the baseline study of 2017, measuring 4.3 cm on today's study.  The pulmonary arteries are somewhat prominent in the outer 1/3 of the lungs, a finding which can be present in the setting of pulmonary hypertension.  Cardiomegaly.  Signed,  Dulcy Fanny. Earleen Newport, DO  [facility]  [facility]   Electronically Signed   By: Corrie Mckusick D.O.   On: 06/28/2017 16:14   Impression:  This 84 year old woman has a stable 4.3 cm fusiform ascending aortic aneurysm which is not significantly changed from last year.  Her blood pressure is under good control.  Her descending thoracic aorta has diameter of 19 mm.  Although the ascending aorta is over 2 times the size of the descending aorta she is 84 years old and this aneurysm has been stable for a long time.  I think it is best to continue a conservative approach and follow this on a yearly basis.  I reviewed the CT images with her and her husband and answered their questions.  Plan:  I will plan to see her back in 1 year with a CT scan of the chest without contrast.   I spent 15 minutes performing this established patient evaluation and > 50% of this time was spent face to face counseling and coordinating the care of this patient's aortic aneurysm.    Gaye Pollack, MD [facility] [phone]

## 2017-07-19 ENCOUNTER — Telehealth: Payer: Self-pay

## 2017-07-19 NOTE — Telephone Encounter (Signed)
Received a message from the scheduling dept stating pt would not like to move forward with event monitor. I called pt, she states she had a lot going on and she didn't know if she still needed to get it. I informed pt that Dr. Radford Pax recommended the monitor to assess for PAF. She states understanding, pt scheduled to come in for monitor on 08/02/17. She verbalized understanding and thankful for the call

## 2017-08-08 ENCOUNTER — Encounter: Payer: Self-pay | Admitting: Cardiology

## 2017-08-24 ENCOUNTER — Ambulatory Visit (INDEPENDENT_AMBULATORY_CARE_PROVIDER_SITE_OTHER): Payer: Medicare Other

## 2017-08-24 DIAGNOSIS — R002 Palpitations: Secondary | ICD-10-CM | POA: Diagnosis not present

## 2017-08-24 DIAGNOSIS — G459 Transient cerebral ischemic attack, unspecified: Secondary | ICD-10-CM

## 2017-08-29 ENCOUNTER — Telehealth: Payer: Self-pay | Admitting: Cardiology

## 2017-08-29 NOTE — Telephone Encounter (Signed)
Called and made patient aware that we could refer to EP to discuss possible LINQ since she has a Hx of TIA. Patient states that she would like to think more about it. She states that she has been taking care of her husband and has a lot on her plate right now and would like to call back when she is ready to move forward with EP referral.

## 2017-08-29 NOTE — Telephone Encounter (Signed)
She could purchase the Alive Cor that is $99

## 2017-08-29 NOTE — Telephone Encounter (Signed)
Pt calls today with c/o 8/10 pain at the site of her monitor patch. She states her skin has a "burn" under the patch site. She states she has called the monitor company for advice and she has already changed out her patch sticker with no relief. Pt states she is going to take the monitor off due to the pain. I have advised pt to call the monitor company to let them know she is taking herself off the monitor. She agreed. Pt also states her BP has been elevated at times but she believes it is from how uncomfortable she is coupled with care taking role she has at home.  Pt states she needs a "one time deal" for a monitor. She states she needs something that she doesn't have to think about.  I advised pt I would forward her concerns to Dr Radford Pax for additional recommendation. Pt had no additional questions and will await a return call.

## 2017-08-29 NOTE — Telephone Encounter (Signed)
Called patient and made her aware that Dr. Radford Pax said that she could purchase AliveCor for her phone. [employer] only has a flip phone and is not compatible with AliveCor. Will forward to Dr. Radford Pax to see if she could potentially be a candidate for a LINQ.

## 2017-08-29 NOTE — Telephone Encounter (Signed)
New message   Patient calling with BP concerns and skin irritation from holter monitor  Pt c/o BP issue: STAT if pt c/o blurred vision, one-sided weakness or slurred speech  1. What are your last 5 BP readings? 170/97  2. Are you having any other symptoms (ex. Dizziness, headache, blurred vision, passed out)? no  3. What is your BP issue? Patient states she is stressed out and her BP is elevated

## 2017-08-29 NOTE — Telephone Encounter (Signed)
Will have to refer to EP to consider linq - she has had a TIA

## 2017-09-10 ENCOUNTER — Other Ambulatory Visit: Payer: Self-pay | Admitting: Nurse Practitioner

## 2017-10-03 ENCOUNTER — Other Ambulatory Visit: Payer: Self-pay | Admitting: Cardiology

## 2017-11-06 ENCOUNTER — Ambulatory Visit: Payer: Medicare Other | Admitting: Gastroenterology

## 2017-11-10 ENCOUNTER — Encounter: Payer: Self-pay | Admitting: *Deleted

## 2017-11-14 ENCOUNTER — Ambulatory Visit: Payer: Medicare Other | Admitting: Gastroenterology

## 2017-11-14 ENCOUNTER — Encounter: Payer: Self-pay | Admitting: Gastroenterology

## 2017-11-14 VITALS — BP 168/78 | HR 58 | Ht 63.0 in | Wt 155.0 lb

## 2017-11-14 DIAGNOSIS — K625 Hemorrhage of anus and rectum: Secondary | ICD-10-CM | POA: Insufficient documentation

## 2017-11-14 DIAGNOSIS — K5900 Constipation, unspecified: Secondary | ICD-10-CM | POA: Insufficient documentation

## 2017-11-14 MED ORDER — HYDROCORTISONE 2.5 % RE CREA: TOPICAL_CREAM | RECTAL | 2 refills | Status: DC

## 2017-11-14 NOTE — Progress Notes (Signed)
11/14/2017 [name] [id number] March 08, 1932   HISTORY OF PRESENT ILLNESS: This is a pleasant 84 year old female is a patient of Dr. Celesta Aver.  She has a remote history of colon cancer.  Last colonoscopy was August 2015 at which time she was noted to have severe diverticulosis and internal hemorrhoids.  She was last seen by Dr. Carlean Purl in May 2018.  She has complained of abdominal pain ever since her cancer and her surgeries.  He try to give her glycopyrrolate, but she says that she just cannot take it.  She has several medication allergies/intolerances, including Bentyl.  She is actually here today at the request of her PCP, Dr. Moreen Fowler, for evaluation regarding constipation, rectal bleeding, anemia.  As noted above she does have internal hemorrhoids as seen on previous colonoscopy.  She tells me that she has been having issues with constipation.  She has taken stool softeners in the past, but has not been taking anything recently to help her move her bowels.  She says that she has tried MiraLAX, but she cannot take any type of powder or liquid as it upsets her stomach.  Says that she seems to have 2-3 bowel movements per week, but sometimes they are somewhat hard or she has to strain.  She sees bright red blood with her bowel movements, noted on the toilet paper.  Says that she previously was using hydrocortisone cream but has run out of that and is asking for refills.  Her anemia is very mild, pretty close to her baseline.  Her last hemoglobin on October 27, 2017 was 11.9 g.  In May 2019 she was at 12.1 g.   Past Medical History:  Diagnosis Date  . Abdominal pain, chronic, left lower quadrant   . Acute torn meniscus of knee   . Adenomatous colon polyp 1970    carcinoma in situ  . Allergic rhinitis   . Amaurosis fugax 08/07/2012  . Anxiety   . Ascending aortic aneurysm ([place]) 12/07/2015  . Benign essential tremor syndrome   . Carotid stenosis    1039 bilateral by dopplers 03/2017.     . colon ca dx'd 1970   surg only  . DDD (degenerative disc disease)   . Diverticulitis   . Diverticulosis   . DM (diabetes mellitus) ([place])   . Duodenitis    peptic, with gastric heterotopia  . Endometriosis    s/p hysterectomy  . Fundic gland polyposis of stomach   . GERD (gastroesophageal reflux disease)   . Heart murmur   . Hiatal hernia 02/03/05  . History of cerebral aneurysm repair    s/p coiling  . History of hemorrhoids    with bleeding  . History of shingles   . HLD (hyperlipidemia)   . HTN (hypertension)   . Hypercholesteremia   . IBS (irritable bowel syndrome)   . Iron deficiency   . Migraine   . MVP (mitral valve prolapse)   . Osteopenia   . Peripheral neuropathy   . Toe fracture, right    second toe  . UTI (lower urinary tract infection)   . Varicose vein    Past Surgical History:  Procedure Laterality Date  . ABDOMINAL HYSTERECTOMY    . APPENDECTOMY    . CARPAL TUNNEL RELEASE  08/26/07  . CATARACT EXTRACTION    . CEREBRAL ANEURYSM REPAIR    . COLON RESECTION    . COLONOSCOPY  06/07/08   divertiulosis, internal hemorrhoids  . COLONOSCOPY W/ BIOPSIES AND POLYPECTOMY  02/03/05   diverticulosis, 4 mm sessile polyps, internal and external hemorrhoids  . CORONARY ANGIOPLASTY WITH STENT PLACEMENT    . ESOPHAGOGASTRODUODENOSCOPY  02/03/05   hiatal hernia, 6 benign gastric polyps  . FLEXIBLE SIGMOIDOSCOPY    . HAND SURGERY Right   . INTRAOCULAR LENS INSERTION    . right hand decompressive fasciotomy  08/26/07   , dorsal and volar  . TONSILLECTOMY      reports that she has never smoked. She has never used smokeless tobacco. She reports that she does not drink alcohol or use drugs. family history includes Colon cancer in her unknown relative; Diabetes in her brother, brother, paternal grandmother, and unknown relative; Heart disease in her brother, brother, and father; Hypertension in her brother; Kidney disease in her father; Microcephaly in her brother; Ovarian  cancer in her mother. Allergies  Allergen Reactions  . Actos [Pioglitazone]     Chronic  Pedal edema:contraindication  . Avandia [Rosiglitazone]     Chronic pedal edema:contraindication  . Fosamax [Alendronate Sodium]     unknown  . Morphine Other (See Comments)  . Ultram [Tramadol] Shortness Of Breath and Palpitations  . Glimepiride     hypersensitivity  . Januvia [Sitagliptin]     hypersensitivity  . Lipitor [Atorvastatin]     Causes muscle pain and swelling  . Metformin And Related     gastritis  . Prandin [Repaglinide]     Abdominal pain: side effects  . Tradjenta [Linagliptin]     GI problems, hypersensitivity  . Zoloft [Sertraline Hcl]     Jittery or zombie like  . Aspirin Other (See Comments)    Bleeding ulcers   . Bentyl [Dicyclomine Hcl]     Came close to passing out. weak  . Ciprofloxacin Nausea And Vomiting    Severe stomach upset  . Hctz [Hydrochlorothiazide] Other (See Comments)    URINARY ISSUES  . Keflex [Cephalexin]   . Morphine And Related Other (See Comments)    Body Spasms  . Norvasc [Amlodipine Besylate]     Weak and dizzy.   Marland Kitchen Oxycodone   . Penicillins Swelling and Rash    Amoxicillin causes stomach upset.      Outpatient Encounter Medications as of 11/14/2017  Medication Sig  . acetaminophen (TYLENOL) 500 MG tablet Take 500 mg by mouth every 6 (six) hours as needed for mild pain, moderate pain, fever or headache.  . ALPRAZolam (XANAX) 0.5 MG tablet Take 0.5 mg by mouth at bedtime.   Marland Kitchen atenolol (TENORMIN) 50 MG tablet TAKE 1 TABLET BY MOUTH EVERY DAY  . Cholecalciferol (VITAMIN D-3) 1000 UNITS CAPS Take 3 capsules by mouth at bedtime.   . furosemide (LASIX) 20 MG tablet Take 20 mg by mouth daily as needed (for swelling).   . Insulin Glargine (LANTUS SOLOSTAR) 100 UNIT/ML SOPN Inject 28 Units into the skin daily with breakfast.   . Multiple Vitamin (MULTIVITAMIN) tablet Take 1 tablet by mouth daily.    . pantoprazole (PROTONIX) 40 MG tablet  TAKE 1 TABLET (40 MG TOTAL) BY MOUTH DAILY BEFORE BREAKFAST.  Marland Kitchen spironolactone (ALDACTONE) 25 MG tablet TAKE 1/2 TABLET BY MOUTH EVERY DAY  . trandolapril (MAVIK) 4 MG tablet TAKE 1 TABLET (4 MG TOTAL) BY MOUTH 2 (TWO) TIMES DAILY.   No facility-administered encounter medications on file as of 11/14/2017.      REVIEW OF SYSTEMS  : All other systems reviewed and negative except where noted in the History of Present Illness.   PHYSICAL EXAM: BP Marland Kitchen)  168/78   Pulse (!) 58   Ht 5\' 3"  (1.6 m)   Wt 155 lb (70.3 kg)   BMI 27.46 kg/m  General: Well developed white female in no acute distress Head: Normocephalic and atraumatic Eyes:  Sclerae anicteric, conjunctiva pink. Ears: Normal auditory acuity Lungs: Clear throughout to auscultation; no increased WOB. Heart: Regular rate and rhythm; no M/R/G. Abdomen: Soft, non-distended.  BS present.  Minimal diffuse TTP. Rectal:  No performed, somewhat declined by patient. Musculoskeletal: Symmetrical with no gross deformities  Skin: No lesions on visible extremities Extremities: No edema  Neurological: Alert oriented x 4, grossly non-focal Psychological:  Alert and cooperative. Normal mood and affect  ASSESSMENT AND PLAN: *Constipation: Not using anything currently.  Says she cannot take MiraLAX or any other type of powder/liquid as it upsets her stomach.  We discussed trial of Linzess 72 mcg daily.  She reiterates how she is very sensitive to an allergic to several medications so she is nervous to try something new.  She is use Colace stool softeners in the past.  She wishes to retry those first.  Told her to start with 2 daily and can increase the amount if needed.  If she does not changes her mind about the Linzess she will call back. *Rectal bleeding:  Has history of internal hemorrhoids.  This is just bright red blood on the toilet paper.  Likely will improve with treatment of constipation.  Will try hydrocortisone cream on preparation H  suppositories internally at bedtime and hydrocortisone cream externally.   *Anemia:  Mild with Hgb 11.9 grams.  Several months ago was 12.1 grams.  Monitor for now.  CC:  Antony Contras, MD

## 2017-11-14 NOTE — Patient Instructions (Addendum)
You can use Colace ( Docusate sodium ) stool softners, start with 2 capsules daily.  We sent a prescription for Hydrocortisone 2.5% cream. To [facility] [address] and [address]   You can get Preperation H suppositories at the pharmacy. Apply the Hudrocortisone cream to the suppository and insert in the rectal area.  Use suppositories at bedtime for 7 days.   Normal BMI (Body Mass Index- based on height and weight) is between 23 and 30. Your BMI today is Body mass index is 27.46 kg/m. Marland Kitchen Please consider follow up  regarding your BMI with your Primary Care Provider.

## 2017-12-27 ENCOUNTER — Other Ambulatory Visit: Payer: Self-pay | Admitting: Cardiology

## 2018-01-15 ENCOUNTER — Observation Stay (HOSPITAL_COMMUNITY): Payer: Medicare Other

## 2018-01-15 ENCOUNTER — Encounter (HOSPITAL_COMMUNITY): Payer: Self-pay | Admitting: Emergency Medicine

## 2018-01-15 ENCOUNTER — Other Ambulatory Visit: Payer: Self-pay

## 2018-01-15 ENCOUNTER — Observation Stay (HOSPITAL_COMMUNITY)
Admission: EM | Admit: 2018-01-15 | Discharge: 2018-01-17 | Disposition: A | Discharge status: Home / Self Care | Payer: Medicare Other | Attending: Internal Medicine | Admitting: Internal Medicine

## 2018-01-15 DIAGNOSIS — Z8673 Personal history of transient ischemic attack (TIA), and cerebral infarction without residual deficits: Secondary | ICD-10-CM | POA: Diagnosis not present

## 2018-01-15 DIAGNOSIS — I6529 Occlusion and stenosis of unspecified carotid artery: Secondary | ICD-10-CM | POA: Insufficient documentation

## 2018-01-15 DIAGNOSIS — I341 Nonrheumatic mitral (valve) prolapse: Secondary | ICD-10-CM | POA: Diagnosis not present

## 2018-01-15 DIAGNOSIS — E1169 Type 2 diabetes mellitus with other specified complication: Secondary | ICD-10-CM

## 2018-01-15 DIAGNOSIS — G43909 Migraine, unspecified, not intractable, without status migrainosus: Secondary | ICD-10-CM | POA: Insufficient documentation

## 2018-01-15 DIAGNOSIS — Z880 Allergy status to penicillin: Secondary | ICD-10-CM | POA: Insufficient documentation

## 2018-01-15 DIAGNOSIS — K5900 Constipation, unspecified: Secondary | ICD-10-CM | POA: Diagnosis not present

## 2018-01-15 DIAGNOSIS — K219 Gastro-esophageal reflux disease without esophagitis: Secondary | ICD-10-CM | POA: Diagnosis not present

## 2018-01-15 DIAGNOSIS — E7800 Pure hypercholesterolemia, unspecified: Secondary | ICD-10-CM | POA: Diagnosis not present

## 2018-01-15 DIAGNOSIS — I7121 Aneurysm of the ascending aorta, without rupture: Secondary | ICD-10-CM | POA: Diagnosis present

## 2018-01-15 DIAGNOSIS — F419 Anxiety disorder, unspecified: Secondary | ICD-10-CM | POA: Diagnosis not present

## 2018-01-15 DIAGNOSIS — Z885 Allergy status to narcotic agent status: Secondary | ICD-10-CM | POA: Insufficient documentation

## 2018-01-15 DIAGNOSIS — E1140 Type 2 diabetes mellitus with diabetic neuropathy, unspecified: Secondary | ICD-10-CM

## 2018-01-15 DIAGNOSIS — Z794 Long term (current) use of insulin: Secondary | ICD-10-CM | POA: Diagnosis not present

## 2018-01-15 DIAGNOSIS — E119 Type 2 diabetes mellitus without complications: Secondary | ICD-10-CM | POA: Diagnosis not present

## 2018-01-15 DIAGNOSIS — R55 Syncope and collapse: Secondary | ICD-10-CM | POA: Diagnosis present

## 2018-01-15 DIAGNOSIS — Z8249 Family history of ischemic heart disease and other diseases of the circulatory system: Secondary | ICD-10-CM | POA: Diagnosis not present

## 2018-01-15 DIAGNOSIS — E042 Nontoxic multinodular goiter: Secondary | ICD-10-CM | POA: Diagnosis not present

## 2018-01-15 DIAGNOSIS — R001 Bradycardia, unspecified: Secondary | ICD-10-CM | POA: Insufficient documentation

## 2018-01-15 DIAGNOSIS — Z955 Presence of coronary angioplasty implant and graft: Secondary | ICD-10-CM | POA: Diagnosis not present

## 2018-01-15 DIAGNOSIS — G250 Essential tremor: Secondary | ICD-10-CM | POA: Diagnosis not present

## 2018-01-15 DIAGNOSIS — M8580 Other specified disorders of bone density and structure, unspecified site: Secondary | ICD-10-CM | POA: Insufficient documentation

## 2018-01-15 DIAGNOSIS — I10 Essential (primary) hypertension: Secondary | ICD-10-CM | POA: Diagnosis not present

## 2018-01-15 DIAGNOSIS — IMO0001 Reserved for inherently not codable concepts without codable children: Secondary | ICD-10-CM

## 2018-01-15 DIAGNOSIS — K589 Irritable bowel syndrome without diarrhea: Secondary | ICD-10-CM | POA: Insufficient documentation

## 2018-01-15 DIAGNOSIS — R0682 Tachypnea, not elsewhere classified: Secondary | ICD-10-CM | POA: Diagnosis present

## 2018-01-15 DIAGNOSIS — I712 Thoracic aortic aneurysm, without rupture: Secondary | ICD-10-CM | POA: Diagnosis not present

## 2018-01-15 DIAGNOSIS — M6281 Muscle weakness (generalized): Secondary | ICD-10-CM | POA: Insufficient documentation

## 2018-01-15 DIAGNOSIS — I671 Cerebral aneurysm, nonruptured: Secondary | ICD-10-CM | POA: Insufficient documentation

## 2018-01-15 LAB — CBC WITH DIFFERENTIAL/PLATELET
Abs Immature Granulocytes: 0.02 10*3/uL (ref 0.00–0.07)
Basophils Absolute: 0.1 10*3/uL (ref 0.0–0.1)
Basophils Relative: 1 %
Eosinophils Absolute: 0.3 10*3/uL (ref 0.0–0.5)
Eosinophils Relative: 3 %
HCT: 40.2 % (ref 36.0–46.0)
Hemoglobin: 12.1 g/dL (ref 12.0–15.0)
Immature Granulocytes: 0 %
Lymphocytes Relative: 52 %
Lymphs Abs: 5.5 10*3/uL — ABNORMAL HIGH (ref 0.7–4.0)
MCH: 24.1 pg — ABNORMAL LOW (ref 26.0–34.0)
MCHC: 30.1 g/dL (ref 30.0–36.0)
MCV: 79.9 fL — ABNORMAL LOW (ref 80.0–100.0)
MONOS PCT: 6 %
Monocytes Absolute: 0.6 10*3/uL (ref 0.1–1.0)
Neutro Abs: 3.9 10*3/uL (ref 1.7–7.7)
Neutrophils Relative %: 38 %
Platelets: 203 10*3/uL (ref 150–400)
RBC: 5.03 MIL/uL (ref 3.87–5.11)
RDW: 16.5 % — AB (ref 11.5–15.5)
WBC: 10.3 10*3/uL (ref 4.0–10.5)
nRBC: 0 % (ref 0.0–0.2)

## 2018-01-15 LAB — BASIC METABOLIC PANEL
ANION GAP: 9 (ref 5–15)
BUN: 20 mg/dL (ref 8–23)
CALCIUM: 9.1 mg/dL (ref 8.9–10.3)
CHLORIDE: 107 mmol/L (ref 98–111)
CO2: 26 mmol/L (ref 22–32)
Creatinine, Ser: 0.92 mg/dL (ref 0.44–1.00)
GFR calc non Af Amer: 58 mL/min — ABNORMAL LOW (ref >60)
Glucose, Bld: 106 mg/dL — ABNORMAL HIGH (ref 70–99)
POTASSIUM: 4 mmol/L (ref 3.5–5.1)
Sodium: 142 mmol/L (ref 135–145)

## 2018-01-15 LAB — TROPONIN I: Troponin I: <0.03 ng/mL (ref <0.03)

## 2018-01-15 LAB — D-DIMER, QUANTITATIVE: D-Dimer, Quant: 0.49 ug/mL-FEU (ref 0.00–0.50)

## 2018-01-15 LAB — CBG MONITORING, ED: GLUCOSE-CAPILLARY: 130 mg/dL — AB (ref 70–99)

## 2018-01-15 MED ORDER — SODIUM CHLORIDE 0.9 % IV BOLUS
500.0000 mL | Freq: Once | INTRAVENOUS | Status: AC
  Administered 2018-01-15: 500 mL via INTRAVENOUS

## 2018-01-15 MED ORDER — INSULIN ASPART 100 UNIT/ML ~~LOC~~ SOLN
0.0000 [IU] | SUBCUTANEOUS | Status: DC
  Administered 2018-01-16: 1 [IU] via SUBCUTANEOUS

## 2018-01-15 NOTE — ED Notes (Signed)
Patient transported to X-ray 

## 2018-01-15 NOTE — ED Notes (Signed)
During orthostatic VS while standing pt knees weak, patient swaying and stating she feels lightheaded

## 2018-01-15 NOTE — H&P (Signed)
[name] UKG:[id number] DOB: 10-Jul-1932 DOA: 01/15/2018     PCP: Antony Contras, MD   Outpatient Specialists:   CAR[facility] Dr. Radford Pax, [facility]   GI  Dr.  Chryl Heck) [facility]    Patient arrived to ER on 01/15/18 at 1724  Patient coming from: home Lives   With family Husband    Chief Complaint:  Chief Complaint  Patient presents with  . Weakness  . Fall    HPI: [name] is a 84 y.o. female with medical history significant of HTN , DM 2 TIA, Carotid stenosis, GERD, HLD, HTN, sp brain aneurism coil  Presented with  With her husband felt lightheaded felt near syncope, was very lightheaded. left hand numbness for a month associated with left facial droop she did not go to the doctor to get this checked she has been too busy taking care of her husband.  Skipped lunch but had a large breakfast lunch meal was at 10:30 AM Husband reported that she was diaphoretic and may be had some difficulty speaking but he does have dementia patient herself noted that she could hear everybody around him but she is too weak to answer. She never fell down as her husband was able to catch her. During episode she felt a bit short of breath and had heavy sensation on her chest R leg swollen for more years Right now she is still reports generalized fatigue but is somewhat improving  Patient is diabetic she has not eaten well today. She has not eaten at her regular time She have had some syncope in the past  Reports recent episode of burning with urination that has resolved.  She takes care of her husband who has dementia.  Reports after she takes her atenolol she feels significantly weak for some time Her HR has been slow in the past. Felt to be due to atenolol   Regarding pertinent Chronic problems:    Hx of DM on Lantus Last echo was in 2017 EF 55-60%  (grade 1 diastolic dysfunction). Sp aneurism coiling 15 years ago While in ER: Orthostatic in ER BP 109/90 HR 56 The following Work up has  been ordered so far: HEr HR has been slow while in [place] is negative   Orders Placed This Encounter  Procedures  . Basic metabolic panel  . CBC with Differential/Platelet  . Orthostatic vital signs  . Consult to hospitalist  . Consult to hospitalist  . EKG 12-Lead    Following Medications were ordered in ER: Medications  sodium chloride 0.9 % bolus 500 mL (500 mLs Intravenous New Bag/Given 01/15/18 1758)    Significant initial  Findings: Abnormal Labs Reviewed  BASIC METABOLIC PANEL - Abnormal; Notable for the following components:      Result Value   Glucose, Bld 106 (*)    GFR calc non Af Amer 58 (*)    All other components within normal limits  CBC WITH DIFFERENTIAL/PLATELET - Abnormal; Notable for the following components:   MCV 79.9 (*)    MCH 24.1 (*)    RDW 16.5 (*)    Lymphs Abs 5.5 (*)    All other components within normal limits   Lactic Acid, Venous No results found for: LATICACIDVEN  Na 142 K 4.0  Cr   Stable,  Lab Results  Component Value Date   CREATININE 0.92 01/15/2018   CREATININE 0.98 12/26/2016   CREATININE 0.85 02/16/2016      WBC  10.3  HG/HCT stable,  Component Value Date/Time   HGB 12.1 01/15/2018 1746   HGB 12.1 12/26/2016 1347   HCT 40.2 01/15/2018 1746   HCT 38.6 12/26/2016 1347        UA   ordered   CXR -  NON acute    ECG:  Personally reviewed by me showing: HR : 61 Rhythm:  NSR   no evidence of ischemic changes QTC 410     ED Triage Vitals  Enc Vitals Group     BP 01/15/18 1733 (!) 136/91     Pulse Rate 01/15/18 1733 64     Resp 01/15/18 1733 (!) 23     Temp 01/15/18 1733 98.1 F (36.7 C)     Temp Source 01/15/18 1733 Oral     SpO2 01/15/18 1728 100 %     Weight 01/15/18 1730 155 lb (70.3 kg)     Height 01/15/18 1730 5\' 3"  (1.6 m)     Head Circumference --      Peak Flow --      Pain Score 01/15/18 1729 0     Pain Loc --      Pain Edu? --      Excl. in [place]? --   TMAX(24)@       Latest   Blood pressure (!) 182/63, pulse 61, temperature 98.1 F (36.7 C), temperature source Oral, resp. rate (!) 26, height 5\' 3"  (1.6 m), weight 70.3 kg, SpO2 100 %.  Hospitalist was called for admission for near syncope   Review of Systems:    Pertinent positives include:  Fatigue  Dizziness, lightheaded  shortness of breath Constitutional:  No weight loss, night sweats, Fevers, chills,, weight loss  HEENT:  No headaches, Difficulty swallowing,Tooth/dental problems,Sore throat,  No sneezing, itching, ear ache, nasal congestion, post nasal drip,  Cardio-vascular:  No chest pain, Orthopnea, PND, anasarca,, palpitations.no Bilateral lower extremity swelling  GI:  No heartburn, indigestion, abdominal pain, nausea, vomiting, diarrhea, change in bowel habits, loss of appetite, melena, blood in stool, hematemesis Resp:  no at rest. No dyspnea on exertion, No excess mucus, no productive cough, No non-productive cough, No coughing up of blood.No change in color of mucus.No wheezing. Skin:  no rash or lesions. No jaundice GU:  no dysuria, change in color of urine, no urgency or frequency. No straining to urinate.  No flank pain.  Musculoskeletal:  No joint pain or no joint swelling. No decreased range of motion. No back pain.  Psych:  No change in mood or affect. No depression or anxiety. No memory loss.  Neuro: no localizing neurological complaints, no tingling, no weakness, no double vision, no gait abnormality, no slurred speech, no confusion  All systems reviewed and apart from [place] all are negative  Past Medical History:   Past Medical History:  Diagnosis Date  . Abdominal pain, chronic, left lower quadrant   . Acute torn meniscus of knee   . Adenomatous colon polyp 1970    carcinoma in situ  . Allergic rhinitis   . Amaurosis fugax 08/07/2012  . Anxiety   . Ascending aortic aneurysm ([place]) 12/07/2015  . Benign essential tremor syndrome   . Carotid stenosis    1039 bilateral by  dopplers 03/2017.   . colon ca dx'd 1970   surg only  . DDD (degenerative disc disease)   . Diverticulitis   . Diverticulosis   . DM (diabetes mellitus) ([place])   . Duodenitis    peptic, with gastric heterotopia  . Endometriosis  s/p hysterectomy  . Fundic gland polyposis of stomach   . GERD (gastroesophageal reflux disease)   . Heart murmur   . Hiatal hernia 02/03/05  . History of cerebral aneurysm repair    s/p coiling  . History of hemorrhoids    with bleeding  . History of shingles   . HLD (hyperlipidemia)   . HTN (hypertension)   . Hypercholesteremia   . IBS (irritable bowel syndrome)   . Iron deficiency   . Migraine   . MVP (mitral valve prolapse)   . Osteopenia   . Peripheral neuropathy   . Toe fracture, right    second toe  . UTI (lower urinary tract infection)   . Varicose vein      Past Surgical History:  Procedure Laterality Date  . ABDOMINAL HYSTERECTOMY    . APPENDECTOMY    . CARPAL TUNNEL RELEASE  08/26/07  . CATARACT EXTRACTION    . CEREBRAL ANEURYSM REPAIR    . COLON RESECTION    . COLONOSCOPY  06/07/08   divertiulosis, internal hemorrhoids  . COLONOSCOPY W/ BIOPSIES AND POLYPECTOMY  02/03/05   diverticulosis, 4 mm sessile polyps, internal and external hemorrhoids  . CORONARY ANGIOPLASTY WITH STENT PLACEMENT    . ESOPHAGOGASTRODUODENOSCOPY  02/03/05   hiatal hernia, 6 benign gastric polyps  . FLEXIBLE SIGMOIDOSCOPY    . HAND SURGERY Right   . INTRAOCULAR LENS INSERTION    . right hand decompressive fasciotomy  08/26/07   , dorsal and volar  . TONSILLECTOMY      Social History:  Ambulatory   walker  Or cane     reports that she has never smoked. She has never used smokeless tobacco. She reports that she does not drink alcohol or use drugs.   Family History:   Family History  Problem Relation Age of Onset  . Ovarian cancer Mother   . Heart disease Father   . Kidney disease Father   . Diabetes Paternal Grandmother   . Diabetes Brother   .  Hypertension Brother   . Heart disease Brother   . Diabetes Brother   . Heart disease Brother   . Microcephaly Brother   . Diabetes Other   . Colon cancer Other     Allergies: Allergies  Allergen Reactions  . Actos [Pioglitazone]     Chronic  Pedal edema:contraindication  . Avandia [Rosiglitazone]     Chronic pedal edema:contraindication  . Fosamax [Alendronate Sodium]     unknown  . Morphine Other (See Comments)  . Ultram [Tramadol] Shortness Of Breath and Palpitations  . Glimepiride     hypersensitivity  . Januvia [Sitagliptin]     hypersensitivity  . Lipitor [Atorvastatin]     Causes muscle pain and swelling  . Metformin And Related     gastritis  . Prandin [Repaglinide]     Abdominal pain: side effects  . Tradjenta [Linagliptin]     GI problems, hypersensitivity  . Zoloft [Sertraline Hcl]     Jittery or zombie like  . Aspirin Other (See Comments)    Bleeding ulcers   . Bentyl [Dicyclomine Hcl]     Came close to passing out. weak  . Ciprofloxacin Nausea And Vomiting    Severe stomach upset  . Hctz [Hydrochlorothiazide] Other (See Comments)    URINARY ISSUES  . Keflex [Cephalexin]   . Morphine And Related Other (See Comments)    Body Spasms  . Norvasc [Amlodipine Besylate]     Weak and dizzy.   Marland Kitchen  Oxycodone   . Penicillins Swelling and Rash    Amoxicillin causes stomach upset.     Prior to Admission medications   Medication Sig Start Date End Date Taking? Authorizing Provider  acetaminophen (TYLENOL) 500 MG tablet Take 500 mg by mouth every 6 (six) hours as needed for mild pain, moderate pain, fever or headache.   Yes [provider]  ALPRAZolam Duanne Moron) 0.5 MG tablet Take 0.5 mg by mouth at bedtime.    Yes [provider]  atenolol (TENORMIN) 50 MG tablet TAKE 1 TABLET BY MOUTH EVERY DAY Patient taking differently: Take 50 mg by mouth daily.  10/03/17  Yes Turner, Eber Hong, MD  Cholecalciferol (VITAMIN D-3) 1000 UNITS CAPS Take 3 capsules by  mouth at bedtime.    Yes [provider]  hydrocortisone (ANUSOL-HC) 2.5 % rectal cream Apply to perianal area 3 times daily. 11/14/17  Yes Zehr, Laban Emperor, PA-C  Insulin Glargine (LANTUS SOLOSTAR) 100 UNIT/ML SOPN Inject 26 Units into the skin daily with breakfast.    Yes [provider]  Multiple Vitamin (MULTIVITAMIN) tablet Take 1 tablet by mouth daily.     Yes [provider]  pantoprazole (PROTONIX) 40 MG tablet TAKE 1 TABLET (40 MG TOTAL) BY MOUTH DAILY BEFORE BREAKFAST. 05/31/17  Yes Gatha Mayer, MD  spironolactone (ALDACTONE) 25 MG tablet TAKE 1/2 TABLET BY MOUTH EVERY DAY Patient taking differently: Take 12.5 mg by mouth daily.  12/27/17  Yes Turner, Traci R, MD  trandolapril (MAVIK) 4 MG tablet TAKE 1 TABLET (4 MG TOTAL) BY MOUTH 2 (TWO) TIMES DAILY. 09/11/17  Yes Burtis Junes, NP   Physical Exam: Blood pressure (!) 182/63, pulse 61, temperature 98.1 F (36.7 C), temperature source Oral, resp. rate (!) 26, height 5\' 3"  (1.6 m), weight 70.3 kg, SpO2 100 %. 1. General:  in No Acute distress  Chronically ill -appearing 2. Psychological: Alert and  Oriented 3. Head/ENT:    Dry Mucous Membranes                          Head Non traumatic, neck supple                       Poor Dentition 4. SKIN:   decreased Skin turgor,  Skin clean Dry and intact no rash 5. Heart: Regular rate and rhythm no Murmur, no Rub or gallop 6.  Clear to auscultation bilaterally, no wheezes or crackles   7. Abdomen: Soft, non-tender, Non distended  obese  bowel sounds present 8. Lower extremities: no clubbing, cyanosis, or   1+edema in right leg  9. Neurologically diminished on the left with left facial droop  10. MSK: Normal range of motion   LABS:     Recent Labs  Lab 01/15/18 1746  WBC 10.3  NEUTROABS 3.9  HGB 12.1  HCT 40.2  MCV 79.9*  PLT 947   Basic Metabolic Panel: Recent Labs  Lab 01/15/18 1746  NA 142  K 4.0  CL 107  CO2 26  GLUCOSE 106*  BUN 20    CREATININE 0.92  CALCIUM 9.1      No results for input(s): AST, ALT, ALKPHOS, BILITOT, PROT, ALBUMIN in the last 168 hours. No results for input(s): LIPASE, AMYLASE in the last 168 hours. No results for input(s): AMMONIA in the last 168 hours.    HbA1C: No results for input(s): HGBA1C in the last 72 hours. CBG: No results  for input(s): GLUCAP in the last 168 hours.    Urine analysis:    Component Value Date/Time   COLORURINE YELLOW 04/25/2015 1520   APPEARANCEUR CLEAR 04/25/2015 1520   LABSPEC 1.016 04/25/2015 1520   PHURINE 6.0 04/25/2015 1520   GLUCOSEU NEGATIVE 04/25/2015 1520   HGBUR NEGATIVE 04/25/2015 1520   BILIRUBINUR NEGATIVE 04/25/2015 1520   KETONESUR NEGATIVE 04/25/2015 1520   PROTEINUR NEGATIVE 04/25/2015 1520   UROBILINOGEN 0.2 04/01/2014 1627   NITRITE NEGATIVE 04/25/2015 1520   LEUKOCYTESUR NEGATIVE 04/25/2015 1520       Cultures:    Component Value Date/Time   SDES URINE, CLEAN CATCH 04/01/2014 1627   SPECREQUEST NONE 04/01/2014 1627   CULT NO GROWTH Performed at [facility]  04/01/2014 1627   REPTSTATUS 04/02/2014 FINAL 04/01/2014 1627     Radiological Exams on Admission: No results found.  Chart has been reviewed    Assessment/Plan  84 y.o. female with medical history significant of HTN , DM 2 TIA, Carotid stenosis, GERD, HLD, HTN, sp brain aneurism coil  Admitted for syncope and likely subacute CVA  Present on Admission: . Syncope -  given risk factor will admit rehydrate obtain CE, monitor on tele and obtain carotid dopplers, echo  . Aneurysm, cerebral, non-ruptured - have had MRI in the past and tolerated well  . Constipation - bowel regimen as needed . Essential hypertension - restart home meds when able avoid severe BP drop but doubt acute CVA given hx of weakness for the past 4 wks . GERD - stable continue home meds  Dm 2 -  - Order Sensitive  SSI   - continue home insulin regimen    -  check TSH and HgA1C    Bradycardia -  Check TSH could be contributing to presyncope will hold atenolol for tonight and monitor on tele Other plan as per orders.  DVT prophylaxis:  SCD  Code Status:  FULL CODE as per patient    I had personally discussed CODE STATUS with patient   Family Communication:   Family not at  Bedside  Disposition Plan:       To home versus may need assisted living once workup is complete and patient is stable                  Would benefit from PT/OT eval prior to DC  Ordered                   Swallow eval - SLP ordered                                     Consults called: neurology aware please reconsult if MRI positive for CVA  Admission status:  Obs   Level of care    tele indefinitely please discontinue once patient no longer qualifies     Samayra Hebel 01/16/2018, 1:42 AM   [facility]  Pager [phone]   after 2 AM please page floor coverage PA If 7AM-7PM, please contact the day team taking care of the patient  [email or web address]  Password [email or web address]

## 2018-01-15 NOTE — ED Provider Notes (Addendum)
[facility] EMERGENCY DEPARTMENT Provider Note   CSN: [id number] Arrival date & time: 01/15/18  1724     History   Chief Complaint Chief Complaint  Patient presents with  . Weakness  . Fall    HPI [name] is a 84 y.o. female.  HPI Patient became generally weak 1 PM today.  She reports that she had syncopal or near syncopal event lasting a few seconds dating that she could hear other people talk but she could not get the words out.  She admits to missing lunch today,  as she was in an appoint with her husband.  However she did have a big meal at 10:30 AM .her husband ported to EMS that she had some difficulty speaking and was diaphoretic.  Her husband does suffer from dementia.  Patient also reports numbness in her left hand which is unchanged for 1 month and swelling in her right leg which has been present for 10 years.  No treatment prior to coming here.  Presently complains of generalized weakness.  No other associated symptoms nothing makes symptoms better or worse.  No headache. no blood per rectum or black stools Past Medical History:  Diagnosis Date  . Abdominal pain, chronic, left lower quadrant   . Acute torn meniscus of knee   . Adenomatous colon polyp 1970    carcinoma in situ  . Allergic rhinitis   . Amaurosis fugax 08/07/2012  . Anxiety   . Ascending aortic aneurysm ([place]) 12/07/2015  . Benign essential tremor syndrome   . Carotid stenosis    1039 bilateral by dopplers 03/2017.   . colon ca dx'd 1970   surg only  . DDD (degenerative disc disease)   . Diverticulitis   . Diverticulosis   . DM (diabetes mellitus) ([place])   . Duodenitis    peptic, with gastric heterotopia  . Endometriosis    s/p hysterectomy  . Fundic gland polyposis of stomach   . GERD (gastroesophageal reflux disease)   . Heart murmur   . Hiatal hernia 02/03/05  . History of cerebral aneurysm repair    s/p coiling  . History of hemorrhoids    with bleeding  . History of  shingles   . HLD (hyperlipidemia)   . HTN (hypertension)   . Hypercholesteremia   . IBS (irritable bowel syndrome)   . Iron deficiency   . Migraine   . MVP (mitral valve prolapse)   . Osteopenia   . Peripheral neuropathy   . Toe fracture, right    second toe  . UTI (lower urinary tract infection)   . Varicose vein     Patient Active Problem List   Diagnosis Date Noted  . Constipation 11/14/2017  . Rectal bleeding 11/14/2017  . Carotid stenosis   . Heart palpitations 03/23/2017  . Ascending aortic aneurysm ([place]) 12/07/2015  . SOB (shortness of breath) 12/07/2015  . Orthostatic hypotension 04/02/2014  . Hematochezia 04/02/2014  . Pain in the chest   . Syncope 04/01/2014  . Diabetes ([place]) 04/01/2014  . Diverticulosis 04/01/2014  . Unruptured cerebral aneurysm 01/08/2014  . Multifactorial gait disorder 01/08/2014  . Hereditary and idiopathic peripheral neuropathy 01/08/2014  . Aneurysm, cerebral, nonruptured 08/07/2012  . Amaurosis fugax 08/07/2012  . Unspecified hereditary and idiopathic peripheral neuropathy 08/07/2012  . Benign essential tremor syndrome   . Abnormal x-ray of lumbar spine 08/29/2010  . Low back pain 08/24/2010  . ANXIETY 03/26/2010  . Essential hypertension 03/26/2010  . Abdominal pain,  bilateral lower quadrant 03/26/2010  . GERD 12/22/2008  . IRON DEFICIENCY 05/22/2008  . Irritable bowel syndrome 04/07/2008  . COLONIC POLYPS, ADENOMATOUS, HX OF 04/07/2008    Past Surgical History:  Procedure Laterality Date  . ABDOMINAL HYSTERECTOMY    . APPENDECTOMY    . CARPAL TUNNEL RELEASE  08/26/07  . CATARACT EXTRACTION    . CEREBRAL ANEURYSM REPAIR    . COLON RESECTION    . COLONOSCOPY  06/07/08   divertiulosis, internal hemorrhoids  . COLONOSCOPY W/ BIOPSIES AND POLYPECTOMY  02/03/05   diverticulosis, 4 mm sessile polyps, internal and external hemorrhoids  . CORONARY ANGIOPLASTY WITH STENT PLACEMENT    . ESOPHAGOGASTRODUODENOSCOPY  02/03/05   hiatal  hernia, 6 benign gastric polyps  . FLEXIBLE SIGMOIDOSCOPY    . HAND SURGERY Right   . INTRAOCULAR LENS INSERTION    . right hand decompressive fasciotomy  08/26/07   , dorsal and volar  . TONSILLECTOMY       OB History   No obstetric history on file.      Home Medications    Prior to Admission medications   Medication Sig Start Date End Date Taking? Authorizing Provider  acetaminophen (TYLENOL) 500 MG tablet Take 500 mg by mouth every 6 (six) hours as needed for mild pain, moderate pain, fever or headache.    [provider]  ALPRAZolam Duanne Moron) 0.5 MG tablet Take 0.5 mg by mouth at bedtime.     [provider]  atenolol (TENORMIN) 50 MG tablet TAKE 1 TABLET BY MOUTH EVERY DAY 10/03/17   Sueanne Margarita, MD  Cholecalciferol (VITAMIN D-3) 1000 UNITS CAPS Take 3 capsules by mouth at bedtime.     [provider]  furosemide (LASIX) 20 MG tablet Take 20 mg by mouth daily as needed (for swelling).     [provider]  hydrocortisone (ANUSOL-HC) 2.5 % rectal cream Apply to perianal area 3 times daily. 11/14/17   Zehr, Laban Emperor, PA-C  Insulin Glargine (LANTUS SOLOSTAR) 100 UNIT/ML SOPN Inject 28 Units into the skin daily with breakfast.     [provider]  Multiple Vitamin (MULTIVITAMIN) tablet Take 1 tablet by mouth daily.      [provider]  pantoprazole (PROTONIX) 40 MG tablet TAKE 1 TABLET (40 MG TOTAL) BY MOUTH DAILY BEFORE BREAKFAST. 05/31/17   Gatha Mayer, MD  spironolactone (ALDACTONE) 25 MG tablet TAKE 1/2 TABLET BY MOUTH EVERY DAY 12/27/17   Turner, Eber Hong, MD  trandolapril (MAVIK) 4 MG tablet TAKE 1 TABLET (4 MG TOTAL) BY MOUTH 2 (TWO) TIMES DAILY. 09/11/17   Burtis Junes, NP    Family History Family History  Problem Relation Age of Onset  . Ovarian cancer Mother   . Heart disease Father   . Kidney disease Father   . Diabetes Paternal Grandmother   . Diabetes Brother   . Hypertension Brother   . Heart disease  Brother   . Diabetes Brother   . Heart disease Brother   . Microcephaly Brother   . Diabetes Other   . Colon cancer Other     Social History Social History   Tobacco Use  . Smoking status: Never Smoker  . Smokeless tobacco: Never Used  Substance Use Topics  . Alcohol use: No  . Drug use: No     Allergies   Actos [pioglitazone]; Avandia [rosiglitazone]; Fosamax [alendronate sodium]; Morphine; Ultram [tramadol]; Glimepiride; Januvia [sitagliptin]; Lipitor [atorvastatin]; Metformin and related; Prandin [repaglinide]; Tradjenta [linagliptin]; Zoloft Retail banker  hcl]; Aspirin; Bentyl [dicyclomine hcl]; Ciprofloxacin; Hctz [hydrochlorothiazide]; Keflex [cephalexin]; Morphine and related; Norvasc [amlodipine besylate]; Oxycodone; and Penicillins   Review of Systems Review of Systems  Constitutional: Negative.   HENT: Negative.   Respiratory: Negative.   Cardiovascular: Positive for leg swelling.       Swelling of right leg chronic  Gastrointestinal: Negative.   Musculoskeletal: Positive for gait problem.       WalksWith cane or walker  Skin: Negative.   Neurological: Positive for weakness, light-headedness and numbness.       Generalized weakness, chronic numbness in hand  Psychiatric/Behavioral: Negative.   All other systems reviewed and are negative.    Physical Exam Updated Vital Signs BP (!) 136/91 (BP Location: Right Arm)   Pulse (!) 59   Temp 98.1 F (36.7 C) (Oral)   Resp 13   Ht 5\' 3"  (1.6 m)   Wt 70.3 kg   SpO2 100%   BMI 27.46 kg/m   Physical Exam Vitals signs and nursing note reviewed.  Constitutional:      Appearance: She is well-developed.  HENT:     Head: Normocephalic and atraumatic.  Eyes:     Conjunctiva/sclera: Conjunctivae normal.     Pupils: Pupils are equal, round, and reactive to light.  Neck:     Musculoskeletal: Neck supple.     Thyroid: No thyromegaly.     Trachea: No tracheal deviation.  Cardiovascular:     Rate and Rhythm:  Normal rate and regular rhythm.     Heart sounds: No murmur.  Pulmonary:     Effort: Pulmonary effort is normal.     Breath sounds: Normal breath sounds.  Abdominal:     General: Bowel sounds are normal. There is no distension.     Palpations: Abdomen is soft.     Tenderness: There is no abdominal tenderness.  Musculoskeletal: Normal range of motion.        General: No tenderness.     Right lower leg: Edema present.     Left lower leg: Edema present.     Comments: +2 pretibial pitting edema of right leg.  +1 pretibial edema of left leg  Skin:    General: Skin is warm and dry.     Capillary Refill: Capillary refill takes less than 2 seconds.     Findings: No rash.  Neurological:     Mental Status: She is alert and oriented to person, place, and time.     Coordination: Coordination normal.     Comments: Clear cranial nerves II through XII grossly intact.  DTRs symmetric bilaterally at knee jerk ankle jerk and biceps toes downgoing bilaterally.  Mildly lightheaded on standing.  Psychiatric:        Mood and Affect: Mood normal.      ED Treatments / Results  Labs (all labs ordered are listed, but only abnormal results are displayed) Labs Reviewed  BASIC METABOLIC PANEL  CBC WITH DIFFERENTIAL/PLATELET    EKG EKG Interpretation  Date/Time:  Monday January 15 2018 17:30:52 EST Ventricular Rate:  61 PR Interval:    QRS Duration: 108 QT Interval:  407 QTC Calculation: 410 R Axis:   71 Text Interpretation:  Sinus rhythm Ventricular premature complex No significant change since last tracing Confirmed by Orlie Dakin [phone]) on 01/15/2018 5:51:17 PM  Results for orders placed or performed during the hospital encounter of 34/19/62  Basic metabolic panel  Result Value Ref Range   Sodium 142 135 - 145 mmol/L   Potassium  4.0 3.5 - 5.1 mmol/L   Chloride 107 98 - 111 mmol/L   CO2 26 22 - 32 mmol/L   Glucose, Bld 106 (H) 70 - 99 mg/dL   BUN 20 8 - 23 mg/dL   Creatinine, Ser  0.92 0.44 - 1.00 mg/dL   Calcium 9.1 8.9 - 10.3 mg/dL   GFR calc non Af Amer 58 (L) >60 mL/min   GFR calc Af Amer >60 >60 mL/min   Anion gap 9 5 - 15  CBC with Differential/Platelet  Result Value Ref Range   WBC 10.3 4.0 - 10.5 K/uL   RBC 5.03 3.87 - 5.11 MIL/uL   Hemoglobin 12.1 12.0 - 15.0 g/dL   HCT 40.2 36.0 - 46.0 %   MCV 79.9 (L) 80.0 - 100.0 fL   MCH 24.1 (L) 26.0 - 34.0 pg   MCHC 30.1 30.0 - 36.0 g/dL   RDW 16.5 (H) 11.5 - 15.5 %   Platelets 203 150 - 400 K/uL   nRBC 0.0 0.0 - 0.2 %   Neutrophils Relative % 38 %   Neutro Abs 3.9 1.7 - 7.7 K/uL   Lymphocytes Relative 52 %   Lymphs Abs 5.5 (H) 0.7 - 4.0 K/uL   Monocytes Relative 6 %   Monocytes Absolute 0.6 0.1 - 1.0 K/uL   Eosinophils Relative 3 %   Eosinophils Absolute 0.3 0.0 - 0.5 K/uL   Basophils Relative 1 %   Basophils Absolute 0.1 0.0 - 0.1 K/uL   Immature Granulocytes 0 %   Abs Immature Granulocytes 0.02 0.00 - 0.07 K/uL  D-dimer, quantitative (not at [facility])  Result Value Ref Range   D-Dimer, Quant 0.49 0.00 - 0.50 ug/mL-FEU  CBG monitoring, ED  Result Value Ref Range   Glucose-Capillary 130 (H) 70 - 99 mg/dL   No results found.  Radiology No results found.  Procedures Procedures (including critical care time)  Medications Ordered in ED Medications  sodium chloride 0.9 % bolus 500 mL (has no administration in time range)     Initial Impression / Assessment and Plan / ED Course  I have reviewed the triage vital signs and the nursing notes.  Pertinent labs & imaging results that were available during my care of the patient were reviewed by me and considered in my medical decision making (see chart for details).     Lab work is unremarkable.  Patient became lightheaded upon standing indicating orthostasis.  I consulted Dr.Doutova hospitalist service who will arrange for overnight stay I do not feel patient requires emergent brain imaging.  Numbness in left hand is 44 month old.  Symptoms today  secondary felt secondary to to near syncope rather than TIA or seizure Final Clinical Impressions(s) / ED Diagnoses  Dx #1 near syncope Final diagnoses:  None   #2elevated blood pressure ED Discharge Orders    None       Orlie Dakin, MD 01/15/18 2038    Orlie Dakin, MD 01/15/18 2039

## 2018-01-15 NOTE — ED Triage Notes (Signed)
Pt from home by ems. Around 2:30pm this afternoon pt reports having a fall and weakness with some difficulty speaking, diaphoretic. Speech is back to normal. Denies loc. Pt reporting some sob and generalized weakness. Pt's left grip is weaker than the right that has been going on for about a month. Pt A&Ox4. Also having rt ankle swelling.   EMS VS CBG 81. BP 190/90, HR 56, 100% room air. Extensive history with diabetes, htn and TIAs.

## 2018-01-16 ENCOUNTER — Other Ambulatory Visit: Payer: Self-pay

## 2018-01-16 ENCOUNTER — Observation Stay (HOSPITAL_COMMUNITY): Payer: Medicare Other

## 2018-01-16 ENCOUNTER — Observation Stay (HOSPITAL_BASED_OUTPATIENT_CLINIC_OR_DEPARTMENT_OTHER): Payer: Medicare Other

## 2018-01-16 DIAGNOSIS — R55 Syncope and collapse: Secondary | ICD-10-CM

## 2018-01-16 DIAGNOSIS — I340 Nonrheumatic mitral (valve) insufficiency: Secondary | ICD-10-CM

## 2018-01-16 LAB — URINALYSIS, ROUTINE W REFLEX MICROSCOPIC
Bilirubin Urine: NEGATIVE
Glucose, UA: NEGATIVE mg/dL
HGB URINE DIPSTICK: NEGATIVE
Ketones, ur: NEGATIVE mg/dL
Leukocytes, UA: NEGATIVE
Nitrite: NEGATIVE
Protein, ur: NEGATIVE mg/dL
Specific Gravity, Urine: 1.013 (ref 1.005–1.030)
pH: 5 (ref 5.0–8.0)

## 2018-01-16 LAB — LIPID PANEL
Cholesterol: 127 mg/dL (ref 0–200)
HDL: 29 mg/dL — ABNORMAL LOW (ref >40)
LDL Cholesterol: 84 mg/dL (ref 0–99)
TRIGLYCERIDES: 72 mg/dL (ref <150)
Total CHOL/HDL Ratio: 4.4 RATIO
VLDL: 14 mg/dL (ref 0–40)

## 2018-01-16 LAB — TROPONIN I
Troponin I: <0.03 ng/mL (ref <0.03)
Troponin I: <0.03 ng/mL (ref <0.03)

## 2018-01-16 LAB — GLUCOSE, CAPILLARY
Glucose-Capillary: 88 mg/dL (ref 70–99)
Glucose-Capillary: 101 mg/dL — ABNORMAL HIGH (ref 70–99)
Glucose-Capillary: 101 mg/dL — ABNORMAL HIGH (ref 70–99)
Glucose-Capillary: 140 mg/dL — ABNORMAL HIGH (ref 70–99)
Glucose-Capillary: 122 mg/dL — ABNORMAL HIGH (ref 70–99)

## 2018-01-16 LAB — TSH: TSH: 1.257 u[IU]/mL (ref 0.350–4.500)

## 2018-01-16 LAB — CBG MONITORING, ED
GLUCOSE-CAPILLARY: 101 mg/dL — AB (ref 70–99)
Glucose-Capillary: 79 mg/dL (ref 70–99)

## 2018-01-16 LAB — HEMOGLOBIN A1C
Hgb A1c MFr Bld: 6.1 % — ABNORMAL HIGH (ref 4.8–5.6)
Mean Plasma Glucose: 128.37 mg/dL

## 2018-01-16 MED ORDER — PANTOPRAZOLE SODIUM 40 MG PO TBEC
40.0000 mg | DELAYED_RELEASE_TABLET | Freq: Every day | ORAL | Status: DC
  Administered 2018-01-16 – 2018-01-17 (×2): 40 mg via ORAL
  Filled 2018-01-16 (×2): qty 1

## 2018-01-16 MED ORDER — METOPROLOL TARTRATE 12.5 MG HALF TABLET
12.5000 mg | ORAL_TABLET | Freq: Two times a day (BID) | ORAL | Status: DC
  Administered 2018-01-16 – 2018-01-17 (×3): 12.5 mg via ORAL
  Filled 2018-01-16 (×3): qty 1

## 2018-01-16 MED ORDER — ACETAMINOPHEN 325 MG PO TABS
650.0000 mg | ORAL_TABLET | Freq: Four times a day (QID) | ORAL | Status: DC | PRN
  Administered 2018-01-16 (×2): 650 mg via ORAL
  Filled 2018-01-16 (×2): qty 2

## 2018-01-16 MED ORDER — SENNOSIDES-DOCUSATE SODIUM 8.6-50 MG PO TABS: 1.0000 | ORAL_TABLET | Freq: Every evening | ORAL | Status: DC | PRN

## 2018-01-16 MED ORDER — INSULIN GLARGINE 100 UNIT/ML ~~LOC~~ SOLN
26.0000 [IU] | Freq: Every day | SUBCUTANEOUS | Status: DC
  Administered 2018-01-16: 26 [IU] via SUBCUTANEOUS
  Filled 2018-01-16 (×2): qty 0.26

## 2018-01-16 MED ORDER — STROKE: EARLY STAGES OF RECOVERY BOOK
Freq: Once | Status: DC
  Filled 2018-01-16: qty 1

## 2018-01-16 MED ORDER — INSULIN ASPART 100 UNIT/ML ~~LOC~~ SOLN: 0.0000 [IU] | Freq: Three times a day (TID) | SUBCUTANEOUS | Status: DC

## 2018-01-16 MED ORDER — IOPAMIDOL (ISOVUE-370) INJECTION 76%
50.0000 mL | Freq: Once | INTRAVENOUS | Status: AC | PRN
  Administered 2018-01-16: 50 mL via INTRAVENOUS

## 2018-01-16 MED ORDER — IOPAMIDOL (ISOVUE-370) INJECTION 76%
INTRAVENOUS | Status: AC
  Filled 2018-01-16: qty 50

## 2018-01-16 MED ORDER — SPIRONOLACTONE 12.5 MG HALF TABLET
12.5000 mg | ORAL_TABLET | Freq: Every day | ORAL | Status: DC
  Administered 2018-01-16 – 2018-01-17 (×2): 12.5 mg via ORAL
  Filled 2018-01-16 (×2): qty 1

## 2018-01-16 MED ORDER — ALPRAZOLAM 0.5 MG PO TABS
0.5000 mg | ORAL_TABLET | Freq: Every day | ORAL | Status: DC
  Administered 2018-01-16 (×2): 0.5 mg via ORAL
  Filled 2018-01-16 (×2): qty 1

## 2018-01-16 MED ORDER — TRANDOLAPRIL 2 MG PO TABS
4.0000 mg | ORAL_TABLET | Freq: Two times a day (BID) | ORAL | Status: DC
  Administered 2018-01-16 – 2018-01-17 (×4): 4 mg via ORAL
  Filled 2018-01-16 (×3): qty 2
  Filled 2018-01-16: qty 1
  Filled 2018-01-16 (×2): qty 2

## 2018-01-16 MED ORDER — SODIUM CHLORIDE 0.9 % IV SOLN
INTRAVENOUS | Status: DC
  Administered 2018-01-16: 04:00:00 via INTRAVENOUS

## 2018-01-16 NOTE — Progress Notes (Signed)
OT NOTE  Ot to defer further treatment to home health OT after speaking with PT Mickel Baas. Ot to sign off acutely.   Jeri Modena, OTR/L  Acute Rehabilitation Services Pager: [phone] Office: [phone] .

## 2018-01-16 NOTE — Progress Notes (Signed)
Carotid artery duplex has been completed. 1-39% ICA stenosis bilaterally.  01/16/18 1:07 PM [name] RVT

## 2018-01-16 NOTE — ED Notes (Signed)
Urine culture tube sent down with urine sample.   

## 2018-01-16 NOTE — Care Management Obs Status (Signed)
[facility] NOTIFICATION   Patient Details  Name: ANNESHA DELGRECO MRN: [id number] Date of Birth: 1932-10-25   Medicare Observation Status Notification Given:  Yes    Midge Minium RN, BSN, NCM-BC, ACM-RN [phone] 01/16/2018, 4:21 PM

## 2018-01-16 NOTE — Evaluation (Signed)
Physical Therapy Evaluation Patient Details Name: [name] MRN: [id number] DOB: 1932/10/28 Today's Date: 01/16/2018   History of Present Illness  Pt is an 84 y/o female with a PMH significant for HTN , DM 2 TIA, Carotid stenosis, GERD, HLD, HTN, sp brain aneurism coil. Pt presents after feeling lightheaded and being near syncope. Pt reports L hand numbness for ~1 month PTA. MRI negative for acute stroke.   Clinical Impression  Pt admitted with above diagnosis. Pt currently with functional limitations due to the deficits listed below (see PT Problem List). At the time of PT eval pt was able to perform transfers and ambulation with gross supervision for safety. Overall safety awareness is low, however no overt LOB noted throughout session. Recommend continued use of the RW at home, and increased assist from family/[facility] as able. Pt is adamant that she needs to be home with her husband, and feel this is reasonable with the increased assistance and HHPT/OT to follow up, to maximize functional independence and safety in the home environment. Acutely, pt will benefit from skilled PT to increase their independence and safety with mobility to allow discharge to the venue listed below.       Follow Up Recommendations Home health PT/ Home health OT;Supervision for mobility/OOB    Equipment Recommendations  None recommended by PT    Recommendations for Other Services       Precautions / Restrictions Precautions Precautions: Fall Restrictions Weight Bearing Restrictions: No      Mobility  Bed Mobility               General bed mobility comments: Pt was received exiting bathroom with RN  Transfers Overall transfer level: Needs assistance Equipment used: Rolling walker (2 wheeled) Transfers: Sit to/from Stand Sit to Stand: Supervision         General transfer comment: VC's for hand placement on seated surface for safety. Pt insistent on using momentum and pulling up  from the walker despite education for safety.   Ambulation/Gait Ambulation/Gait assistance: Supervision Gait Distance (Feet): 100 Feet Assistive device: Rolling walker (2 wheeled) Gait Pattern/deviations: Step-through pattern;Decreased stride length;Trunk flexed Gait velocity: Decreased Gait velocity interpretation: <1.8 ft/sec, indicate of risk for recurrent falls General Gait Details: Slow and guarded with a grossly flexed trunk. Pt taking hands off the walker multiple times to tell therapist that her legs have buckled on her in the past. PT educating on max safety and keeping hands on the walker. No knee buckling noted throughout gait training.   Stairs            Wheelchair Mobility    Modified Rankin (Stroke Patients Only)       Balance Overall balance assessment: Mild deficits observed, not formally tested                                           Pertinent Vitals/Pain Pain Assessment: No/denies pain    Home Living Family/patient expects to be discharged to:: Private residence Living Arrangements: Spouse/significant other Available Help at Discharge: Family;Personal care attendant;Available 24 hours/day(Step-daughter and [facility]) Type of Home: House Home Access: Level entry     Home Layout: One level Home Equipment: [place] - 2 wheels;Cane - single point;Shower seat;Tub bench;Grab bars - tub/shower      Prior Function Level of Independence: Independent with assistive device(s)  Comments: Pt reports she uses a cane in the house, and RW in the hospital. She drives when she feels like she can but step-daughter has been doing the grocery shopping. Pt was cooking/cleaning, independent with ADL's and helping to care for her husband with dementia      Hand Dominance        Extremity/Trunk Assessment   Upper Extremity Assessment Upper Extremity Assessment: Generalized weakness;LUE deficits/detail LUE Deficits / Details: L  hand numbness - reports has been present for about a month however is better today than it has been.     Lower Extremity Assessment Lower Extremity Assessment: Generalized weakness(Grossly 4-/5 quads/hams/hip flexors)    Cervical / Trunk Assessment Cervical / Trunk Assessment: Other exceptions Cervical / Trunk Exceptions: Forward head posture with rounded shoulders  Communication   Communication: No difficulties  Cognition Arousal/Alertness: Awake/alert Behavior During Therapy: WFL for tasks assessed/performed Overall Cognitive Status: Impaired/Different from baseline Area of Impairment: Attention;Safety/judgement                   Current Attention Level: Sustained     Safety/Judgement: Decreased awareness of safety;Decreased awareness of deficits            General Comments      Exercises     Assessment/Plan    PT Assessment Patient needs continued PT services  PT Problem List Decreased strength       PT Treatment Interventions DME instruction;Gait training;Functional mobility training;Therapeutic activities;Therapeutic exercise;Neuromuscular re-education;Patient/family education    PT Goals (Current goals can be found in the Care Plan section)  Acute Rehab PT Goals Patient Stated Goal: Return home to her husband as soon as possible PT Goal Formulation: With patient Time For Goal Achievement: 01/23/18 Potential to Achieve Goals: Good    Frequency Min 3X/week   Barriers to discharge Decreased caregiver support Will not have 24 hour assist unless she hires help     Co-evaluation               AM-PAC PT "6 Clicks" Mobility  Outcome Measure Help needed turning from your back to your side while in a flat bed without using bedrails?: None Help needed moving from lying on your back to sitting on the side of a flat bed without using bedrails?: None Help needed moving to and from a bed to a chair (including a wheelchair)?: None Help needed standing  up from a chair using your arms (e.g., wheelchair or bedside chair)?: None Help needed to walk in hospital room?: None Help needed climbing 3-5 steps with a railing? : A Little 6 Click Score: 23    End of Session Equipment Utilized During Treatment: Gait belt Activity Tolerance: Patient limited by fatigue Patient left: in chair;with call bell/phone within reach Nurse Communication: Mobility status PT Visit Diagnosis: Unsteadiness on feet (R26.81);Difficulty in walking, not elsewhere classified (R26.2);Muscle weakness (generalized) (M62.81)    Time: 7026-3785 PT Time Calculation (min) (ACUTE ONLY): 24 min   Charges:   PT Evaluation $PT Eval Moderate Complexity: 1 Mod PT Treatments $Gait Training: 8-22 mins        Rolinda Roan, PT, DPT Acute Rehabilitation Services Pager: [phone] Office: [phone]   Thelma Comp 01/16/2018, 11:02 AM

## 2018-01-16 NOTE — ED Notes (Signed)
Patient transported to MRI 

## 2018-01-16 NOTE — Progress Notes (Signed)
PROGRESS NOTE    [name]  FTD:[id number] DOB: 12-19-1932 DOA: 01/15/2018 PCP: Antony Contras, MD   Brief Narrative:  84 year old female with a medical history significant for hypertension, diabetes type 2, TIA, carotid stenosis, gastroesophageal reflux disease, hypertension, and status post brain aneurysm coil with artery going to the left thigh.  Patient presented with her husband feeling lightheaded and near syncopal.  She was very lightheaded she had left hand numbness for a month that was associated with left facial droop.  She did not go to the doctor and get this checked because she has been taking care of her husband.  She is essentially homebound because she cannot leave him alone.  Became diaphoretic and passed out her husband was able to catch her.  He said she felt short of breath and have a heavy sensation on her chest during the episode.  Patient has had recent poor eating habits and has been skipping meals.  He says when she takes atenolol at home she feels very weak for some time after taking it.  Her heart rate has been known to get very bradycardic in the past.   Assessment & Plan:   Active Problems:   GERD   Essential hypertension   Aneurysm, cerebral, nonruptured   Syncope   Diabetes ([place])   Ascending aortic aneurysm (HCC)   Constipation   . Syncope -BMP and CBC unremarkable, echocardiogram was obtained.  Thoughts are pending.  Carotid ultrasound results are pending as well.  Continue present care.  . Aneurysm, cerebral, non-ruptured - have had MRI in the past and tolerated well  . Constipation - bowel regimen as needed . Essential hypertension - restart home meds when able avoid severe BP drop but doubt acute CVA given hx of weakness for the past 4 wks . GERD - stable continue home meds  Dm 2 -  - Order Sensitive  SSI   - continue home insulin regimen    -  TSH is 1.257 and HgA1C is 6.1   Bradycardia -  Check TSH could be contributing to presyncope.   Heart rate dipped into the high 40s last night.  Pressure is elevated.  Start metoprolol at a lower dose.  (12.5 mg p.o. twice daily) other plan as per orders.  DVT prophylaxis:  SCD  Code Status:  FULL CODE as per patient    I had personally discussed CODE STATUS with patient   Family Communication:   Family not at  Bedside  Disposition Plan:       To home versus may need assisted living once workup is complete and patient is stable      Subjective: Feeling okay.  Clearly exhausted.  Awaiting results of testing.  Objective: Vitals:   01/16/18 0530 01/16/18 0601 01/16/18 0801 01/16/18 1029  BP: (!) 126/50 (!) 136/48 (!) 144/58 (!) 166/67  Pulse: (!) 48 (!) 50 (!) 50 (!) 58  Resp: 20  18 20   Temp:  97.7 F (36.5 C) (!) 97.3 F (36.3 C) 97.7 F (36.5 C)  TempSrc:  Oral Oral Oral  SpO2: 95% 96% 95% 95%  Weight:      Height:        Intake/Output Summary (Last 24 hours) at 01/16/2018 1244 Last data filed at 01/15/2018 1953 Gross per 24 hour  Intake 500 ml  Output -  Net 500 ml   Filed Weights   01/15/18 1730  Weight: 70.3 kg    Examination:  General exam: Appears calm and comfortable  Respiratory system: Clear to auscultation. Respiratory effort normal. Cardiovascular system: S1 & S2 heard, RRR. No JVD, murmurs, rubs, gallops or clicks. No pedal edema. Gastrointestinal system: Abdomen is nondistended, soft and nontender. No organomegaly or masses felt. Normal bowel sounds heard. Central nervous system: Alert and oriented. No focal neurological deficits. Extremities: Symmetric 5 x 5 power. Skin: No rashes, lesions or ulcers Psychiatry: Judgement and insight appear normal. Mood & affect appropriate.     Data Reviewed: I have personally reviewed following labs and imaging studies  CBC: Recent Labs  Lab 01/15/18 1746  WBC 10.3  NEUTROABS 3.9  HGB 12.1  HCT 40.2  MCV 79.9*  PLT 811   Basic Metabolic Panel: Recent Labs  Lab 01/15/18 1746  NA 142  K  4.0  CL 107  CO2 26  GLUCOSE 106*  BUN 20  CREATININE 0.92  CALCIUM 9.1   GFR: Estimated Creatinine Clearance: 43.6 mL/min (by C-G formula based on SCr of 0.92 mg/dL). Liver Function Tests: No results for input(s): AST, ALT, ALKPHOS, BILITOT, PROT, ALBUMIN in the last 168 hours. No results for input(s): LIPASE, AMYLASE in the last 168 hours. No results for input(s): AMMONIA in the last 168 hours. Coagulation Profile: No results for input(s): INR, PROTIME in the last 168 hours. Cardiac Enzymes: Recent Labs  Lab 01/15/18 1926 01/16/18 0125 01/16/18 0649  TROPONINI <0.03 <0.03 <0.03   BNP (last 3 results) No results for input(s): PROBNP in the last 8760 hours. HbA1C: Recent Labs    01/16/18 0233  HGBA1C 6.1*   CBG: Recent Labs  Lab 01/15/18 1951 01/16/18 0121 01/16/18 0437 01/16/18 0814  GLUCAP 130* 79 101* 88   Lipid Profile: Recent Labs    01/16/18 0233  CHOL 127  HDL 29*  LDLCALC 84  TRIG 72  CHOLHDL 4.4   Thyroid Function Tests: Recent Labs    01/16/18 0233  TSH 1.257   Anemia Panel: No results for input(s): VITAMINB12, FOLATE, FERRITIN, TIBC, IRON, RETICCTPCT in the last 72 hours. Sepsis Labs: No results for input(s): PROCALCITON, LATICACIDVEN in the last 168 hours.  No results found for this or any previous visit (from the past 240 hour(s)).       Radiology Studies: Dg Chest 2 View  Result Date: 01/15/2018 CLINICAL DATA:  Syncopal episode and shortness of breath EXAM: CHEST - 2 VIEW COMPARISON:  06/28/2017 FINDINGS: Cardiac shadow is mildly enlarged. Aortic calcifications are again seen. The lungs are well aerated bilaterally. No focal infiltrate or sizable effusion is seen. No acute bony abnormality is noted. IMPRESSION: No active cardiopulmonary disease. Electronically Signed   By: Inez Catalina M.D.   On: 01/15/2018 20:34   Ct Angio Neck W Or Wo Contrast  Result Date: 01/16/2018 CLINICAL DATA:  Known carotid disease.  Recurrent  syncope. EXAM: CT ANGIOGRAPHY NECK TECHNIQUE: Multidetector CT imaging of the neck was performed using the standard protocol during bolus administration of intravenous contrast. Multiplanar CT image reconstructions and MIPs were obtained to evaluate the vascular anatomy. Carotid stenosis measurements (when applicable) are obtained utilizing NASCET criteria, using the distal internal carotid diameter as the denominator. CONTRAST:  35mL ISOVUE-370 IOPAMIDOL (ISOVUE-370) INJECTION 76% COMPARISON:  Neck MRA 10/23/2013 FINDINGS: Aortic arch: Partially covered ascending aortic aneurysm, known from chest CT in 2018. There is atherosclerotic plaque of the arch and descending aorta. Three vessel branching pattern Right carotid system: ICA tortuosity which causes a loose kink. No notable atheromatous changes or stenosis. No beading. Treated ophthalmic segment aneurysm with coil mass  and streak artifact. Left carotid system: Minor atheromatous changes at the ICA bulb. No stenosis or beading. There is ICA tortuosity. Atherosclerotic plaque at the carotid siphon with local streak artifact limiting quantification of the stenosis. There is a known ophthalmic segment ICA aneurysm measuring 3 mm. Vertebral arteries: No proximal subclavian flow limiting stenosis or ulceration. Both vertebral arteries are smooth and widely patent to the basilar. Skeleton: Generalized degenerative disc narrowing. C3-4 anterolisthesis related to advanced facet arthropathy on the right. Other neck: Small thyroid nodules below size threshold for sonographic follow-up. Upper chest: Negative IMPRESSION: Widely patent cervical carotid and vertebral arteries. No explanation for recurrent syncope. Electronically Signed   By: Monte Fantasia M.D.   On: 01/16/2018 12:15   Mr Brain Wo Contrast  Result Date: 01/16/2018 CLINICAL DATA:  Syncopal episode, difficulty speaking. LEFT hand numbness for 1 month. History of carotid stenosis, tremor, coiled cerebral  aneurysm, hypertension and hyperlipidemia. EXAM: MRI HEAD WITHOUT CONTRAST MRA HEAD WITHOUT CONTRAST TECHNIQUE: Multiplanar, multiecho pulse sequences of the brain and surrounding structures were obtained without intravenous contrast. Angiographic images of the head were obtained using MRA technique without contrast. COMPARISON:  MRI/MRA head November 12, 2014 FINDINGS: MRI HEAD FINDINGS INTRACRANIAL CONTENTS: No reduced diffusion to suggest acute ischemia. No susceptibility artifact to suggest hemorrhage. The ventricles and sulci are normal for patient's age. Old small cerebellar infarcts. Scattered subcentimeter supratentorial white matter T2 hyperintensities. No suspicious parenchymal signal, masses, mass effect. No abnormal extra-axial fluid collections. No extra-axial masses. VASCULAR: Normal major intracranial vascular flow voids present at skull base. SKULL AND UPPER CERVICAL SPINE: No abnormal sellar expansion. No suspicious calvarial bone marrow signal. Craniocervical junction maintained. SINUSES/ORBITS: Small LEFT maxillary mucosal retention cyst. Mild paranasal sinus mucosal thickening.The included ocular globes and orbital contents are non-suspicious. Status post bilateral ocular lens implants. OTHER: None. MRA HEAD FINDINGS ANTERIOR CIRCULATION: Normal flow related enhancement of the included cervical, petrous, cavernous and supraclinoid internal carotid arteries. Stable 3 mm superiorly directed 4 mm paraophthalmic aneurysm. 3 mm RIGHT PCOM aneurysm versus infundibulum. Patent anterior communicating artery. Patent anterior and middle cerebral arteries. No large vessel occlusion, flow limiting stenosis. POSTERIOR CIRCULATION: RIGHT vertebral artery is dominant. Vertebrobasilar arteries are patent, with normal flow related enhancement of the main branch vessels. Patent posterior cerebral arteries. No large vessel occlusion, flow limiting stenosis,  aneurysm. ANATOMIC VARIANTS: None. Source images and MIP  images were reviewed. IMPRESSION: MRI HEAD: 1. No acute intracranial process. 2. Stable examination including old small cerebellar infarcts mild chronic small vessel ischemic changes. MRA HEAD: 1. No emergent large vessel occlusion or flow-limiting stenosis. 2. Stable 4 mm LEFT paraophthalmic aneurysm. Small RIGHT PCOM origin aneurysm versus infundibulum. Electronically Signed   By: Elon Alas M.D.   On: 01/16/2018 04:22   Mr Jodene Nam Head Wo Contrast  Result Date: 01/16/2018 CLINICAL DATA:  Syncopal episode, difficulty speaking. LEFT hand numbness for 1 month. History of carotid stenosis, tremor, coiled cerebral aneurysm, hypertension and hyperlipidemia. EXAM: MRI HEAD WITHOUT CONTRAST MRA HEAD WITHOUT CONTRAST TECHNIQUE: Multiplanar, multiecho pulse sequences of the brain and surrounding structures were obtained without intravenous contrast. Angiographic images of the head were obtained using MRA technique without contrast. COMPARISON:  MRI/MRA head November 12, 2014 FINDINGS: MRI HEAD FINDINGS INTRACRANIAL CONTENTS: No reduced diffusion to suggest acute ischemia. No susceptibility artifact to suggest hemorrhage. The ventricles and sulci are normal for patient's age. Old small cerebellar infarcts. Scattered subcentimeter supratentorial white matter T2 hyperintensities. No suspicious parenchymal signal, masses, mass effect. No abnormal  extra-axial fluid collections. No extra-axial masses. VASCULAR: Normal major intracranial vascular flow voids present at skull base. SKULL AND UPPER CERVICAL SPINE: No abnormal sellar expansion. No suspicious calvarial bone marrow signal. Craniocervical junction maintained. SINUSES/ORBITS: Small LEFT maxillary mucosal retention cyst. Mild paranasal sinus mucosal thickening.The included ocular globes and orbital contents are non-suspicious. Status post bilateral ocular lens implants. OTHER: None. MRA HEAD FINDINGS ANTERIOR CIRCULATION: Normal flow related enhancement of the  included cervical, petrous, cavernous and supraclinoid internal carotid arteries. Stable 3 mm superiorly directed 4 mm paraophthalmic aneurysm. 3 mm RIGHT PCOM aneurysm versus infundibulum. Patent anterior communicating artery. Patent anterior and middle cerebral arteries. No large vessel occlusion, flow limiting stenosis. POSTERIOR CIRCULATION: RIGHT vertebral artery is dominant. Vertebrobasilar arteries are patent, with normal flow related enhancement of the main branch vessels. Patent posterior cerebral arteries. No large vessel occlusion, flow limiting stenosis,  aneurysm. ANATOMIC VARIANTS: None. Source images and MIP images were reviewed. IMPRESSION: MRI HEAD: 1. No acute intracranial process. 2. Stable examination including old small cerebellar infarcts mild chronic small vessel ischemic changes. MRA HEAD: 1. No emergent large vessel occlusion or flow-limiting stenosis. 2. Stable 4 mm LEFT paraophthalmic aneurysm. Small RIGHT PCOM origin aneurysm versus infundibulum. Electronically Signed   By: Elon Alas M.D.   On: 01/16/2018 04:22        Scheduled Meds: .  stroke: mapping our early stages of recovery book   Does not apply Once  . ALPRAZolam  0.5 mg Oral QHS  . insulin aspart  0-9 Units Subcutaneous Q4H  . insulin glargine  26 Units Subcutaneous Q breakfast  . iopamidol      . pantoprazole  40 mg Oral QAC breakfast  . spironolactone  12.5 mg Oral Daily  . trandolapril  4 mg Oral BID   Continuous Infusions: . sodium chloride 75 mL/hr at 01/16/18 0420     LOS: 0 days    Time spent: *25 min    Lady Deutscher, MD FACP [facility] Pager [phone]  If 7PM-7AM, please contact night-coverage [email or web address] Password [facility] 01/16/2018, 12:44 PM

## 2018-01-16 NOTE — Progress Notes (Signed)
  Echocardiogram 2D Echocardiogram has been performed.  Belladonna Lubinski G Laquentin Loudermilk 01/16/2018, 3:46 PM

## 2018-01-16 NOTE — Care Management Note (Signed)
Case Management Note  Patient Details  Name: [name] MRN: [id number] Date of Birth: Nov 16, 1932  Subjective/Objective:  84 yo female presented with left hand numbness.                 Action/Plan: CM met with patient/neice to discuss dispositional needs. Patient reports living at home with her spouse who has dementia and she is his primary caregiver, independent with ADLs and utilizes a cane/RW during ambulation. PCP verified as: Dr. Antony Contras; pharmacy of choice: [facility] [place]. PT/OT recommends HHPT/OT with patient agreeable. CMS HH compare list provided with [facility] selected. [place] referral given to [place], [facility] liaison; AVS updated. Patient indicated her family will provide transportation home. CM will continue to follow.   Expected Discharge Date:                  Expected Discharge Plan:  [place]  In-House Referral:  NA  Discharge planning Services  CM Consult  Post Acute Care Choice:  Home Health Choice offered to:  Patient  DME Arranged:  N/A DME Agency:  NA  HH Arranged:  PT, OT HH Agency:  [facility] (now [facility])  Status of Service:  In process, will continue to follow  If discussed at Long Length of Stay Meetings, dates discussed:    Additional Comments:  Midge Minium RN, BSN, NCM-BC, ACM-RN [phone] 01/16/2018, 12:48 PM

## 2018-01-17 DIAGNOSIS — E1140 Type 2 diabetes mellitus with diabetic neuropathy, unspecified: Secondary | ICD-10-CM

## 2018-01-17 DIAGNOSIS — I10 Essential (primary) hypertension: Secondary | ICD-10-CM

## 2018-01-17 DIAGNOSIS — R55 Syncope and collapse: Secondary | ICD-10-CM

## 2018-01-17 DIAGNOSIS — Z794 Long term (current) use of insulin: Secondary | ICD-10-CM | POA: Diagnosis not present

## 2018-01-17 LAB — ECHOCARDIOGRAM COMPLETE
Height: 63 in
WEIGHTICAEL: 2480 [oz_av]

## 2018-01-17 LAB — GLUCOSE, CAPILLARY
Glucose-Capillary: 71 mg/dL (ref 70–99)
Glucose-Capillary: 82 mg/dL (ref 70–99)

## 2018-01-17 MED ORDER — METOPROLOL TARTRATE 25 MG PO TABS: 12.5000 mg | ORAL_TABLET | Freq: Two times a day (BID) | ORAL | 0 refills | Status: DC

## 2018-01-17 MED ORDER — HYDRALAZINE HCL 10 MG PO TABS
10.0000 mg | ORAL_TABLET | Freq: Three times a day (TID) | ORAL | Status: DC
  Administered 2018-01-17: 10 mg via ORAL
  Filled 2018-01-17: qty 1

## 2018-01-17 MED ORDER — INSULIN GLARGINE 100 UNIT/ML ~~LOC~~ SOLN
18.0000 [IU] | Freq: Every day | SUBCUTANEOUS | Status: DC
  Administered 2018-01-17: 18 [IU] via SUBCUTANEOUS
  Filled 2018-01-17: qty 0.18

## 2018-01-17 MED ORDER — HYDRALAZINE HCL 10 MG PO TABS: 10.0000 mg | ORAL_TABLET | Freq: Three times a day (TID) | ORAL | 0 refills | Status: DC

## 2018-01-17 NOTE — Progress Notes (Signed)
Pt driven home by family with husband, verbalizes understanding of discharge teaching.

## 2018-01-17 NOTE — Discharge Summary (Signed)
Physician Discharge Summary  TAHARI CLABAUGH EHM:[id number] DOB: 1932/11/11 DOA: 01/15/2018  PCP: Antony Contras, MD  Admit date: 01/15/2018 Discharge date: 01/17/2018  Admitted From: home Discharge disposition: home   Recommendations for Outpatient Follow-Up:   1. Event monitor 2. BB changed and HR has good response to exertion 3. Needs continued titration of BP medications (multiple allergies) 4. Home health PT/OT/RN 5. Has lots of stress caring for her husband   Discharge Diagnosis:   Active Problems:   GERD   Essential hypertension   Aneurysm, cerebral, nonruptured   Syncope   Diabetes ([place])   Ascending aortic aneurysm ([place])   Constipation    Discharge Condition: Improved.  Diet recommendation: Low sodium, heart healthy.  Carbohydrate-modified.  Wound care: None.  Code status: Full.   History of Present Illness:   [name] 84 year old female with a medical history significant for hypertension, diabetes type 2, TIA, carotid stenosis, gastroesophageal reflux disease, hypertension, and status post brain aneurysm coil with artery going to the left thigh.  Patient presented with her husband feeling lightheaded and near syncopal.  She was very lightheaded she had left hand numbness for a month that was associated with left facial droop.  She did not go to the doctor and get this checked because she has been taking care of her husband.  She is essentially homebound because she cannot leave him alone.  Became diaphoretic and passed out her husband was able to catch her.  He said she felt short of breath and have a heavy sensation on her chest during the episode.  Patient has had recent poor eating habits and has been skipping meals.  He says when she takes atenolol at home she feels very weak for some time after taking it.  Her heart rate has been known to get very bradycardic in the past.   Hospital Course by Problem:    Syncope - echocardiogram : Left  ventricle: The cavity size was normal. There was mild   concentric hypertrophy. Systolic function was normal. The   estimated ejection fraction was in the range of 60% to 65%. Wall   motion was normal; there were no regional wall motion   abnormalities. Doppler parameters are consistent with abnormal   left ventricular relaxation (grade 1 diastolic dysfunction).   Doppler parameters are consistent with elevated ventricular   end-diastolic filling pressure. -Carotid ultrasound <30% -? HR dropping vs dehydration vs stress   Aneurysm, cerebral, non-ruptured   Constipation - bowel regimen as needed  Essential hypertension  -multiple allergies -on metoprolol-- HR does not tolerate much -on ACE/ARB -allergic to CCB -added low dose hydralazine that can be titrated outpatient -orthostatics were negative   GERD - stable continue home meds  Dm 2 -  -resume home regimen - TSH is 1.257 and HgA1C is 6.1  Bradycardia -  - Heart rate dipped into the high 40s last night.  Pressure is elevated.   -atenolol changed to metoprolol at a lower dose.  (12.5 mg p.o. twice daily)     Medical Consultants:      Discharge Exam:   Vitals:   01/17/18 1100 01/17/18 1140  BP: 136/72 (!) 189/60  Pulse:  63  Resp:  20  Temp:  98 F (36.7 C)  SpO2:  97%   Vitals:   01/17/18 0546 01/17/18 0743 01/17/18 1100 01/17/18 1140  BP: (!) 169/58 (!) 189/80 136/72 (!) 189/60  Pulse: (!) 51 60  63  Resp: 20  18  20  Temp: (!) 97.5 F (36.4 C) 97.9 F (36.6 C)  98 F (36.7 C)  TempSrc: Oral Oral  Oral  SpO2: 95% 96%  97%  Weight:      Height:        General exam: Appears calm and comfortable-- tearful when talking about husband and caring for him   The results of significant diagnostics from this hospitalization (including imaging, microbiology, ancillary and laboratory) are listed below for reference.     Procedures and Diagnostic Studies:   Dg Chest 2 View  Result Date:  01/15/2018 CLINICAL DATA:  Syncopal episode and shortness of breath EXAM: CHEST - 2 VIEW COMPARISON:  06/28/2017 FINDINGS: Cardiac shadow is mildly enlarged. Aortic calcifications are again seen. The lungs are well aerated bilaterally. No focal infiltrate or sizable effusion is seen. No acute bony abnormality is noted. IMPRESSION: No active cardiopulmonary disease. Electronically Signed   By: Inez Catalina M.D.   On: 01/15/2018 20:34   Ct Angio Neck W Or Wo Contrast  Result Date: 01/16/2018 CLINICAL DATA:  Known carotid disease.  Recurrent syncope. EXAM: CT ANGIOGRAPHY NECK TECHNIQUE: Multidetector CT imaging of the neck was performed using the standard protocol during bolus administration of intravenous contrast. Multiplanar CT image reconstructions and MIPs were obtained to evaluate the vascular anatomy. Carotid stenosis measurements (when applicable) are obtained utilizing NASCET criteria, using the distal internal carotid diameter as the denominator. CONTRAST:  67mL ISOVUE-370 IOPAMIDOL (ISOVUE-370) INJECTION 76% COMPARISON:  Neck MRA 10/23/2013 FINDINGS: Aortic arch: Partially covered ascending aortic aneurysm, known from chest CT in 2018. There is atherosclerotic plaque of the arch and descending aorta. Three vessel branching pattern Right carotid system: ICA tortuosity which causes a loose kink. No notable atheromatous changes or stenosis. No beading. Treated ophthalmic segment aneurysm with coil mass and streak artifact. Left carotid system: Minor atheromatous changes at the ICA bulb. No stenosis or beading. There is ICA tortuosity. Atherosclerotic plaque at the carotid siphon with local streak artifact limiting quantification of the stenosis. There is a known ophthalmic segment ICA aneurysm measuring 3 mm. Vertebral arteries: No proximal subclavian flow limiting stenosis or ulceration. Both vertebral arteries are smooth and widely patent to the basilar. Skeleton: Generalized degenerative disc  narrowing. C3-4 anterolisthesis related to advanced facet arthropathy on the right. Other neck: Small thyroid nodules below size threshold for sonographic follow-up. Upper chest: Negative IMPRESSION: Widely patent cervical carotid and vertebral arteries. No explanation for recurrent syncope. Electronically Signed   By: Monte Fantasia M.D.   On: 01/16/2018 12:15   Mr Brain Wo Contrast  Result Date: 01/16/2018 CLINICAL DATA:  Syncopal episode, difficulty speaking. LEFT hand numbness for 1 month. History of carotid stenosis, tremor, coiled cerebral aneurysm, hypertension and hyperlipidemia. EXAM: MRI HEAD WITHOUT CONTRAST MRA HEAD WITHOUT CONTRAST TECHNIQUE: Multiplanar, multiecho pulse sequences of the brain and surrounding structures were obtained without intravenous contrast. Angiographic images of the head were obtained using MRA technique without contrast. COMPARISON:  MRI/MRA head November 12, 2014 FINDINGS: MRI HEAD FINDINGS INTRACRANIAL CONTENTS: No reduced diffusion to suggest acute ischemia. No susceptibility artifact to suggest hemorrhage. The ventricles and sulci are normal for patient's age. Old small cerebellar infarcts. Scattered subcentimeter supratentorial white matter T2 hyperintensities. No suspicious parenchymal signal, masses, mass effect. No abnormal extra-axial fluid collections. No extra-axial masses. VASCULAR: Normal major intracranial vascular flow voids present at skull base. SKULL AND UPPER CERVICAL SPINE: No abnormal sellar expansion. No suspicious calvarial bone marrow signal. Craniocervical junction maintained. SINUSES/ORBITS: Small LEFT  maxillary mucosal retention cyst. Mild paranasal sinus mucosal thickening.The included ocular globes and orbital contents are non-suspicious. Status post bilateral ocular lens implants. OTHER: None. MRA HEAD FINDINGS ANTERIOR CIRCULATION: Normal flow related enhancement of the included cervical, petrous, cavernous and supraclinoid internal carotid  arteries. Stable 3 mm superiorly directed 4 mm paraophthalmic aneurysm. 3 mm RIGHT PCOM aneurysm versus infundibulum. Patent anterior communicating artery. Patent anterior and middle cerebral arteries. No large vessel occlusion, flow limiting stenosis. POSTERIOR CIRCULATION: RIGHT vertebral artery is dominant. Vertebrobasilar arteries are patent, with normal flow related enhancement of the main branch vessels. Patent posterior cerebral arteries. No large vessel occlusion, flow limiting stenosis,  aneurysm. ANATOMIC VARIANTS: None. Source images and MIP images were reviewed. IMPRESSION: MRI HEAD: 1. No acute intracranial process. 2. Stable examination including old small cerebellar infarcts mild chronic small vessel ischemic changes. MRA HEAD: 1. No emergent large vessel occlusion or flow-limiting stenosis. 2. Stable 4 mm LEFT paraophthalmic aneurysm. Small RIGHT PCOM origin aneurysm versus infundibulum. Electronically Signed   By: Elon Alas M.D.   On: 01/16/2018 04:22   Mr Jodene Nam Head Wo Contrast  Result Date: 01/16/2018 CLINICAL DATA:  Syncopal episode, difficulty speaking. LEFT hand numbness for 1 month. History of carotid stenosis, tremor, coiled cerebral aneurysm, hypertension and hyperlipidemia. EXAM: MRI HEAD WITHOUT CONTRAST MRA HEAD WITHOUT CONTRAST TECHNIQUE: Multiplanar, multiecho pulse sequences of the brain and surrounding structures were obtained without intravenous contrast. Angiographic images of the head were obtained using MRA technique without contrast. COMPARISON:  MRI/MRA head November 12, 2014 FINDINGS: MRI HEAD FINDINGS INTRACRANIAL CONTENTS: No reduced diffusion to suggest acute ischemia. No susceptibility artifact to suggest hemorrhage. The ventricles and sulci are normal for patient's age. Old small cerebellar infarcts. Scattered subcentimeter supratentorial white matter T2 hyperintensities. No suspicious parenchymal signal, masses, mass effect. No abnormal extra-axial fluid  collections. No extra-axial masses. VASCULAR: Normal major intracranial vascular flow voids present at skull base. SKULL AND UPPER CERVICAL SPINE: No abnormal sellar expansion. No suspicious calvarial bone marrow signal. Craniocervical junction maintained. SINUSES/ORBITS: Small LEFT maxillary mucosal retention cyst. Mild paranasal sinus mucosal thickening.The included ocular globes and orbital contents are non-suspicious. Status post bilateral ocular lens implants. OTHER: None. MRA HEAD FINDINGS ANTERIOR CIRCULATION: Normal flow related enhancement of the included cervical, petrous, cavernous and supraclinoid internal carotid arteries. Stable 3 mm superiorly directed 4 mm paraophthalmic aneurysm. 3 mm RIGHT PCOM aneurysm versus infundibulum. Patent anterior communicating artery. Patent anterior and middle cerebral arteries. No large vessel occlusion, flow limiting stenosis. POSTERIOR CIRCULATION: RIGHT vertebral artery is dominant. Vertebrobasilar arteries are patent, with normal flow related enhancement of the main branch vessels. Patent posterior cerebral arteries. No large vessel occlusion, flow limiting stenosis,  aneurysm. ANATOMIC VARIANTS: None. Source images and MIP images were reviewed. IMPRESSION: MRI HEAD: 1. No acute intracranial process. 2. Stable examination including old small cerebellar infarcts mild chronic small vessel ischemic changes. MRA HEAD: 1. No emergent large vessel occlusion or flow-limiting stenosis. 2. Stable 4 mm LEFT paraophthalmic aneurysm. Small RIGHT PCOM origin aneurysm versus infundibulum. Electronically Signed   By: Elon Alas M.D.   On: 01/16/2018 04:22   Vas US Carotid (at [place] Only)  Result Date: 01/17/2018 Carotid Arterial Duplex Study Indications: CVA. Performing Technologist: Oliver Hum RVT  Examination Guidelines: A complete evaluation includes B-mode imaging, spectral Doppler, color Doppler, and power Doppler as needed of all accessible portions of  each vessel. Bilateral testing is considered an integral part of a complete examination. Limited examinations for reoccurring indications  may be performed as noted.  Right Carotid Findings: +----------+--------+--------+--------+-----------------------+--------+           PSV cm/sEDV cm/sStenosisDescribe               Comments +----------+--------+--------+--------+-----------------------+--------+ CCA Prox  62      9               smooth and heterogenous         +----------+--------+--------+--------+-----------------------+--------+ CCA Distal59      13              smooth and heterogenous         +----------+--------+--------+--------+-----------------------+--------+ ICA Prox  68      21              smooth and heterogenous         +----------+--------+--------+--------+-----------------------+--------+ ICA Distal162     33                                     tortuous +----------+--------+--------+--------+-----------------------+--------+ ECA       77      5                                               +----------+--------+--------+--------+-----------------------+--------+ +----------+--------+-------+--------+-------------------+           PSV cm/sEDV cmsDescribeArm Pressure (mmHG) +----------+--------+-------+--------+-------------------+ [id number]                                        +----------+--------+-------+--------+-------------------+ +---------+--------+--+--------+--+---------+ VertebralPSV cm/s75EDV cm/s12Antegrade +---------+--------+--+--------+--+---------+  Left Carotid Findings: +----------+--------+--------+--------+-----------------------+--------+           PSV cm/sEDV cm/sStenosisDescribe               Comments +----------+--------+--------+--------+-----------------------+--------+ CCA Prox  92      14              smooth and heterogenous          +----------+--------+--------+--------+-----------------------+--------+ CCA Distal66      14              smooth and heterogenous         +----------+--------+--------+--------+-----------------------+--------+ ICA Prox  105     22              smooth and heterogenoustortuous +----------+--------+--------+--------+-----------------------+--------+ ICA Distal75      19                                     tortuous +----------+--------+--------+--------+-----------------------+--------+ ECA       64      9                                               +----------+--------+--------+--------+-----------------------+--------+ +----------+--------+--------+--------+-------------------+ SubclavianPSV cm/sEDV cm/sDescribeArm Pressure (mmHG) +----------+--------+--------+--------+-------------------+           90                                          +----------+--------+--------+--------+-------------------+ +---------+--------+--+--------+-+---------+  VertebralPSV cm/s52EDV cm/s9Antegrade +---------+--------+--+--------+-+---------+  Summary: Right Carotid: Velocities in the right ICA are consistent with a 1-39% stenosis. Left Carotid: Velocities in the left ICA are consistent with a 1-39% stenosis. Vertebrals: Bilateral vertebral arteries demonstrate antegrade flow. *See table(s) above for measurements and observations.  Electronically signed by Antony Contras MD on 01/17/2018 at 9:43:25 AM.    Final      Labs:   Basic Metabolic Panel: Recent Labs  Lab 01/15/18 1746  NA 142  K 4.0  CL 107  CO2 26  GLUCOSE 106*  BUN 20  CREATININE 0.92  CALCIUM 9.1   GFR Estimated Creatinine Clearance: 43.6 mL/min (by C-G formula based on SCr of 0.92 mg/dL). Liver Function Tests: No results for input(s): AST, ALT, ALKPHOS, BILITOT, PROT, ALBUMIN in the last 168 hours. No results for input(s): LIPASE, AMYLASE in the last 168 hours. No results for input(s): AMMONIA in  the last 168 hours. Coagulation profile No results for input(s): INR, PROTIME in the last 168 hours.  CBC: Recent Labs  Lab 01/15/18 1746  WBC 10.3  NEUTROABS 3.9  HGB 12.1  HCT 40.2  MCV 79.9*  PLT 203   Cardiac Enzymes: Recent Labs  Lab 01/15/18 1926 01/16/18 0125 01/16/18 0649  TROPONINI <0.03 <0.03 <0.03   BNP: Invalid input(s): POCBNP CBG: Recent Labs  Lab 01/16/18 1537 01/16/18 1732 01/16/18 2133 01/17/18 0648 01/17/18 1044  GLUCAP 101* 140* 122* 71 82   D-Dimer Recent Labs    01/15/18 1926  DDIMER 0.49   Hgb A1c Recent Labs    01/16/18 0233  HGBA1C 6.1*   Lipid Profile Recent Labs    01/16/18 0233  CHOL 127  HDL 29*  LDLCALC 84  TRIG 72  CHOLHDL 4.4   Thyroid function studies Recent Labs    01/16/18 0233  TSH 1.257   Anemia work up No results for input(s): VITAMINB12, FOLATE, FERRITIN, TIBC, IRON, RETICCTPCT in the last 72 hours. Microbiology No results found for this or any previous visit (from the past 240 hour(s)).   Discharge Instructions:   Discharge Instructions    Diet - low sodium heart healthy   Complete by:  As directed    Diet Carb Modified   Complete by:  As directed    Discharge instructions   Complete by:  As directed    Home health- PT/OT/RN Close follow up with PCP for BP medication adjustments   Increase activity slowly   Complete by:  As directed      Allergies as of 01/17/2018      Reactions   Actos [pioglitazone]    Chronic  Pedal edema:contraindication   Avandia [rosiglitazone]    Chronic pedal edema:contraindication   Fosamax [alendronate Sodium]    unknown   Morphine Other (See Comments)   Ultram [tramadol] Shortness Of Breath, Palpitations   Glimepiride    hypersensitivity   Januvia [sitagliptin]    hypersensitivity   Lipitor [atorvastatin]    Causes muscle pain and swelling   Metformin And Related    gastritis   Prandin [repaglinide]    Abdominal pain: side effects   Tradjenta  [linagliptin]    GI problems, hypersensitivity   Zoloft [sertraline Hcl]    Jittery or zombie like   Aspirin Other (See Comments)   Bleeding ulcers    Bentyl [dicyclomine Hcl]    Came close to passing out. weak   Ciprofloxacin Nausea And Vomiting   Severe stomach upset   Hctz [hydrochlorothiazide] Other (See Comments)   URINARY  ISSUES   Keflex [cephalexin]    Morphine And Related Other (See Comments)   Body Spasms   Norvasc [amlodipine Besylate]    Weak and dizzy.    Oxycodone    Penicillins Swelling, Rash   Amoxicillin causes stomach upset.      Medication List    STOP taking these medications   atenolol 50 MG tablet Commonly known as:  TENORMIN     TAKE these medications   acetaminophen 500 MG tablet Commonly known as:  TYLENOL Take 500 mg by mouth every 6 (six) hours as needed for mild pain, moderate pain, fever or headache.   ALPRAZolam 0.5 MG tablet Commonly known as:  XANAX Take 0.5 mg by mouth at bedtime.   hydrALAZINE 10 MG tablet Commonly known as:  APRESOLINE Take 1 tablet (10 mg total) by mouth every 8 (eight) hours.   hydrocortisone 2.5 % rectal cream Commonly known as:  ANUSOL-HC Apply to perianal area 3 times daily.   LANTUS SOLOSTAR 100 UNIT/ML Solostar Pen Generic drug:  Insulin Glargine Inject 26 Units into the skin daily with breakfast.   metoprolol tartrate 25 MG tablet Commonly known as:  LOPRESSOR Take 0.5 tablets (12.5 mg total) by mouth 2 (two) times daily.   multivitamin tablet Take 1 tablet by mouth daily.   pantoprazole 40 MG tablet Commonly known as:  PROTONIX TAKE 1 TABLET (40 MG TOTAL) BY MOUTH DAILY BEFORE BREAKFAST.   spironolactone 25 MG tablet Commonly known as:  ALDACTONE TAKE 1/2 TABLET BY MOUTH EVERY DAY   trandolapril 4 MG tablet Commonly known as:  MAVIK TAKE 1 TABLET (4 MG TOTAL) BY MOUTH 2 (TWO) TIMES DAILY.   Vitamin D-3 25 MCG (1000 UT) Caps Take 3 capsules by mouth at bedtime.      Follow-up  Information    [facility] Follow up.   Specialty:  Home Health Services Why:  Home Health Physical Therapy and Occupational Therapy Contact information: [place] [address] [phone]        Antony Contras, MD Follow up in 1 week(s).   Specialty:  Family Medicine Contact information: [address], [address] [place] [address] [phone]        Sueanne Margarita, MD Follow up in 1 week(s).   Specialty:  Cardiology Why:  for BP medication adjustments Contact information: [address] [place] [address] [phone]            Time coordinating discharge: 25 min  Signed:  Geradine Girt DO  [facility] 01/17/2018, 1:07 PM

## 2018-01-17 NOTE — Progress Notes (Signed)
Inpatient Diabetes Program Recommendations  AACE/ADA: New Consensus Statement on Inpatient Glycemic Control (2019)  Target Ranges:  Prepandial:   less than 140 mg/dL      Peak postprandial:   less than 180 mg/dL (1-2 hours)      Critically ill patients:  140 - 180 mg/dL   Results for [name], [name] (MRN [id number]) as of 01/17/2018 09:20  Ref. Range 01/16/2018 08:14 01/16/2018 13:18 01/16/2018 15:37 01/16/2018 17:32 01/16/2018 21:33 01/17/2018 06:48  Glucose-Capillary Latest Ref Range: 70 - 99 mg/dL 88 101 (H) 101 (H) 140 (H) 122 (H) 71  Results for [name], [name] (MRN [id number]) as of 01/17/2018 09:20  Ref. Range 01/16/2018 02:33  Hemoglobin A1C Latest Ref Range: 4.8 - 5.6 % 6.1 (H)   Review of Glycemic Control  Diabetes history: DM2 Outpatient Diabetes medications: Lantus 26 units QAM Current orders for Inpatient glycemic control: Lantus 18 units QAM, Novolog 0-9 units TID with meals  Inpatient Diabetes Program Recommendations:  HgbA1C: A1C 6.1% on 01/16/18 indicating an average glucose of 128 mg/dl over the past 2-3 months. Insulin-Basal: Noted Lantus was decreased from 26 units to 18 units QAM and patient has already received Lantus 18 units this morning.  NOTE: Noted consult for Diabetes Coordinator. Talked with patient regarding DM control and diet. Patient states that she sees Dr. Buddy Duty for DM management and that she takes Lantus 26 units QAM. Patient states she takes Lantus 26 units consistently and she does not skip or adjust dose of Lantus at all.  Patient reports that she checks her glucose 1-2 times per day and that it is usually in the 90's mg/dl. Inquired about hypoglycemia and patient notes that she does not routinely have hypoglycemia. However, patient notes that she may have hypoglycemia from time to time and she is able to tell when her glucose is low and she eats peanut butter and graham crackers when that happens. Patient notes that she eats 3 meals a day plus 1-3  snacks per day. Patient notes that her husband has dementia and diabetes and he does have issues with hypoglycemia so she is diligent with them eating 3 meals a day plus snacks. Patient notes that her A1C is usually in the 6% range and informed her that her current A1C was 6.1% on 01/16/18 indicating an average glucose of 128 mg/dl. Praised patient for keeping DM well controlled and encouraged her to continue to take DM medications as prescribed and to continue to follow up with Dr. Buddy Duty. Patient verbalized understanding of information discussed and expressed appreciation for diabetes coordinator follow up.    Thanks, Barnie Alderman, RN, MSN, CDE Diabetes Coordinator Inpatient Diabetes Program [phone] (Team Pager from 8am to 5pm) '

## 2018-01-17 NOTE — Plan of Care (Signed)
  Problem: Education: Goal: Knowledge of General Education information will improve Description Including pain rating scale, medication(s)/side effects and non-pharmacologic comfort measures Outcome: Progressing Note:  POC reviewed with pt.   

## 2018-01-17 NOTE — Evaluation (Signed)
Speech Language Pathology Evaluation Patient Details Name: [name] MRN: [id number] DOB: [birth year] Today's Date: 01/17/2018 Time: 5277-8242 SLP Time Calculation (min) (ACUTE ONLY): 13 min  Problem List:  Patient Active Problem List   Diagnosis Date Noted  . Constipation 11/14/2017  . Rectal bleeding 11/14/2017  . Carotid stenosis   . Heart palpitations 03/23/2017  . Ascending aortic aneurysm ([place]) 12/07/2015  . SOB (shortness of breath) 12/07/2015  . Orthostatic hypotension 04/02/2014  . Hematochezia 04/02/2014  . Pain in the chest   . Syncope 04/01/2014  . Diabetes ([place]) 04/01/2014  . Diverticulosis 04/01/2014  . Unruptured cerebral aneurysm 01/08/2014  . Multifactorial gait disorder 01/08/2014  . Hereditary and idiopathic peripheral neuropathy 01/08/2014  . Aneurysm, cerebral, nonruptured 08/07/2012  . Amaurosis fugax 08/07/2012  . Unspecified hereditary and idiopathic peripheral neuropathy 08/07/2012  . Benign essential tremor syndrome   . Abnormal x-ray of lumbar spine 08/29/2010  . Low back pain 08/24/2010  . ANXIETY 03/26/2010  . Essential hypertension 03/26/2010  . Abdominal pain, bilateral lower quadrant 03/26/2010  . GERD 12/22/2008  . IRON DEFICIENCY 05/22/2008  . Irritable bowel syndrome 04/07/2008  . COLONIC POLYPS, ADENOMATOUS, HX OF 04/07/2008   Past Medical History:  Past Medical History:  Diagnosis Date  . Abdominal pain, chronic, left lower quadrant   . Acute torn meniscus of knee   . Adenomatous colon polyp 1970    carcinoma in situ  . Allergic rhinitis   . Amaurosis fugax 08/07/2012  . Anxiety   . Ascending aortic aneurysm ([place]) 12/07/2015  . Benign essential tremor syndrome   . Carotid stenosis    1039 bilateral by dopplers 03/2017.   . colon ca dx'd 1970   surg only  . DDD (degenerative disc disease)   . Diverticulitis   . Diverticulosis   . DM (diabetes mellitus) ([place])   . Duodenitis    peptic, with gastric heterotopia  .  Endometriosis    s/p hysterectomy  . Fundic gland polyposis of stomach   . GERD (gastroesophageal reflux disease)   . Heart murmur   . Hiatal hernia 02/03/05  . History of cerebral aneurysm repair    s/p coiling  . History of hemorrhoids    with bleeding  . History of shingles   . HLD (hyperlipidemia)   . HTN (hypertension)   . Hypercholesteremia   . IBS (irritable bowel syndrome)   . Iron deficiency   . Migraine   . MVP (mitral valve prolapse)   . Osteopenia   . Peripheral neuropathy   . Toe fracture, right    second toe  . UTI (lower urinary tract infection)   . Varicose vein    Past Surgical History:  Past Surgical History:  Procedure Laterality Date  . ABDOMINAL HYSTERECTOMY    . APPENDECTOMY    . CARPAL TUNNEL RELEASE  08/26/07  . CATARACT EXTRACTION    . CEREBRAL ANEURYSM REPAIR    . COLON RESECTION    . COLONOSCOPY  06/07/08   divertiulosis, internal hemorrhoids  . COLONOSCOPY W/ BIOPSIES AND POLYPECTOMY  02/03/05   diverticulosis, 4 mm sessile polyps, internal and external hemorrhoids  . CORONARY ANGIOPLASTY WITH STENT PLACEMENT    . ESOPHAGOGASTRODUODENOSCOPY  02/03/05   hiatal hernia, 6 benign gastric polyps  . FLEXIBLE SIGMOIDOSCOPY    . HAND SURGERY Right   . INTRAOCULAR LENS INSERTION    . right hand decompressive fasciotomy  08/26/07   , dorsal and volar  . TONSILLECTOMY  HPI:  Pt is an 84 y/o female with a PMH significant for HTN , DM 2 TIA, Carotid stenosis, GERD, HLD, HTN, sp brain aneurism coil. Pt presents after feeling lightheaded and being near syncope. Pt reports L hand numbness for ~1 month PTA. MRI negative for acute stroke.    Assessment / Plan / Recommendation Clinical Impression  Pt presents with functional overall cognitive abilities. Having said this, pt is often more pre-occupied with care for her husband and could exhibit decreased safety awareness for herself as she completes tasks. Education provided and pt plans on having help within the  home at discharge. ST to sign off at this time.     SLP Assessment  SLP Recommendation/Assessment: Patient does not need any further Speech Lanaguage Pathology Services SLP Visit Diagnosis: Cognitive communication deficit (R41.841)             SLP Evaluation Cognition  Overall Cognitive Status: Within Functional Limits for tasks assessed Arousal/Alertness: Awake/alert Orientation Level: Oriented X4       Comprehension       Expression Expression Primary Mode of Expression: Verbal Verbal Expression Overall Verbal Expression: Appears within functional limits for tasks assessed Written Expression Dominant Hand: Right Written Expression: Not tested   Oral / Motor  Oral Motor/Sensory Function Overall Oral Motor/Sensory Function: Within functional limits Motor Speech Overall Motor Speech: Appears within functional limits for tasks assessed   GO                    [name] 01/17/2018, 11:00 AM

## 2018-02-02 ENCOUNTER — Telehealth: Payer: Self-pay | Admitting: Cardiology

## 2018-02-02 NOTE — Telephone Encounter (Signed)
If she is going to remain at [facility] the PCP needs to follow BP

## 2018-02-02 NOTE — Telephone Encounter (Signed)
Spoke with OT he is going to contact PCP to get orders.

## 2018-02-02 NOTE — Telephone Encounter (Signed)
The OT wants to know what parameters he should contact our office and or cease therapy. Example: If blood pressure is over 034 systolic, he will contact our office. Include info about HR. The OT says she usually runs high around 150s.

## 2018-02-02 NOTE — Telephone Encounter (Signed)
Please find out specifics for what they want.  I have not seen this patient in a year and would prefer PCP to manage BP

## 2018-02-02 NOTE — Telephone Encounter (Signed)
Spoke with OT for [facility], he stated that PCP is signing the orders, however, the PCP requested Dr. Radford Pax set up the blood pressure parameters to follow for this patient. Sending to Dr. Radford Pax.

## 2018-02-08 ENCOUNTER — Encounter: Payer: Self-pay | Admitting: Cardiology

## 2018-02-08 ENCOUNTER — Ambulatory Visit: Payer: Medicare Other | Admitting: Cardiology

## 2018-02-08 VITALS — BP 142/62 | HR 59 | Ht 63.0 in | Wt 153.4 lb

## 2018-02-08 DIAGNOSIS — Z794 Long term (current) use of insulin: Secondary | ICD-10-CM | POA: Diagnosis not present

## 2018-02-08 DIAGNOSIS — R55 Syncope and collapse: Secondary | ICD-10-CM | POA: Diagnosis not present

## 2018-02-08 DIAGNOSIS — I10 Essential (primary) hypertension: Secondary | ICD-10-CM

## 2018-02-08 DIAGNOSIS — E119 Type 2 diabetes mellitus without complications: Secondary | ICD-10-CM

## 2018-02-08 DIAGNOSIS — IMO0001 Reserved for inherently not codable concepts without codable children: Secondary | ICD-10-CM

## 2018-02-08 NOTE — Patient Instructions (Signed)
Medication Instructions:  Your physician recommends that you continue on your current medications as directed. Please refer to the Current Medication list given to you today. If you need a refill on your cardiac medications before your next appointment, please call your pharmacy.   Lab work: None  If you have labs (blood work) drawn today and your tests are completely normal, you will receive your results only by: Marland Kitchen MyChart Message (if you have MyChart) OR . A paper copy in the mail If you have any lab test that is abnormal or we need to change your treatment, we will call you to review the results.  Testing/Procedures: None  Follow-Up: At [facility], you and your health needs are our priority.  As part of our continuing mission to provide you with exceptional heart care, we have created designated Provider Care Teams.  These Care Teams include your primary Cardiologist (physician) and Advanced Practice Providers (APPs -  Physician Assistants and Nurse Practitioners) who all work together to provide you with the care you need, when you need it.  . Your physician recommends that you schedule a follow-up appointment in: 3 months with DR TURNER ONLY.  Any Other Special Instructions Will Be Listed Below (If Applicable).

## 2018-02-08 NOTE — Progress Notes (Signed)
02/08/2018 [name]   1932/08/06  [id number]  Primary Physician Antony Contras, MD Primary Cardiologist: Dr Radford Pax  HPI:  Pleasant 85 y.o. female with a hx of HTN, dyslipdiemia, MVP, PUD with hiatal hernia and Type II DM and thoracic ascending aortic aneurysm at 4.6cm by chest CT angio 2017 and 4.2cm by CT angio 2018.  She is statin intolerant. She also has a history of cerebral aneurysm s/p coiling. She also had a lot of stress and anxiety related to her husband's dementia and deconditioning status.  She has a history of possible TIA in the past.  She had an Event monitor placed but only wore it 4 days because of a reaction to the pads.  She had no arrhythmia during that time.   She is in the office today for a post hospital follow up after she was admitted 01/15/18 for near syncope. Her husband reported that she was diaphoretic and may have had some difficulty speaking. The patient herself noted that she could hear everybody around her but she is too weak to answer. She never fell down and her husband was able to catch her.  Echo showed normal LVF-EF 60-65%, grade 1 DD, mild LVH. Telemetry showed bradycardia at night and the patient's bet blocker was changed to Lopressor 12.5 mg BID. She has not had recurrent syncope or near syncope. She remains under stress at home caring for her husband who has dementia.    Current Outpatient Medications  Medication Sig Dispense Refill  . acetaminophen (TYLENOL) 500 MG tablet Take 500 mg by mouth every 6 (six) hours as needed for mild pain, moderate pain, fever or headache.    . ALPRAZolam (XANAX) 0.5 MG tablet Take 0.5 mg by mouth at bedtime.     . Cholecalciferol (VITAMIN D-3) 1000 UNITS CAPS Take 3 capsules by mouth at bedtime.     . hydrALAZINE (APRESOLINE) 10 MG tablet Take 1 tablet (10 mg total) by mouth every 8 (eight) hours. 30 tablet 0  . hydrocortisone (ANUSOL-HC) 2.5 % rectal cream Apply to perianal area 3 times daily. 30 g 2  . Insulin  Glargine (LANTUS SOLOSTAR) 100 UNIT/ML SOPN Inject 26 Units into the skin daily with breakfast.     . metoprolol tartrate (LOPRESSOR) 25 MG tablet Take 0.5 tablets (12.5 mg total) by mouth 2 (two) times daily. 60 tablet 0  . Multiple Vitamin (MULTIVITAMIN) tablet Take 1 tablet by mouth daily.      . pantoprazole (PROTONIX) 40 MG tablet TAKE 1 TABLET (40 MG TOTAL) BY MOUTH DAILY BEFORE BREAKFAST. 30 tablet 10  . spironolactone (ALDACTONE) 25 MG tablet TAKE 1/2 TABLET BY MOUTH EVERY DAY (Patient taking differently: Take 12.5 mg by mouth daily. ) 45 tablet 2  . trandolapril (MAVIK) 4 MG tablet TAKE 1 TABLET (4 MG TOTAL) BY MOUTH 2 (TWO) TIMES DAILY. 180 tablet 2   No current facility-administered medications for this visit.     Allergies  Allergen Reactions  . Actos [Pioglitazone]     Chronic  Pedal edema:contraindication  . Avandia [Rosiglitazone]     Chronic pedal edema:contraindication  . Fosamax [Alendronate Sodium]     unknown  . Morphine Other (See Comments)  . Ultram [Tramadol] Shortness Of Breath and Palpitations  . Glimepiride     hypersensitivity  . Januvia [Sitagliptin]     hypersensitivity  . Lipitor [Atorvastatin]     Causes muscle pain and swelling  . Metformin And Related     gastritis  .  Prandin [Repaglinide]     Abdominal pain: side effects  . Tradjenta [Linagliptin]     GI problems, hypersensitivity  . Zoloft [Sertraline Hcl]     Jittery or zombie like  . Aspirin Other (See Comments)    Bleeding ulcers   . Bentyl [Dicyclomine Hcl]     Came close to passing out. weak  . Ciprofloxacin Nausea And Vomiting    Severe stomach upset  . Hctz [Hydrochlorothiazide] Other (See Comments)    URINARY ISSUES  . Keflex [Cephalexin]   . Morphine And Related Other (See Comments)    Body Spasms  . Norvasc [Amlodipine Besylate]     Weak and dizzy.   Marland Kitchen Oxycodone   . Penicillins Swelling and Rash    Amoxicillin causes stomach upset.    Past Medical History:  Diagnosis  Date  . Abdominal pain, chronic, left lower quadrant   . Acute torn meniscus of knee   . Adenomatous colon polyp 1970    carcinoma in situ  . Allergic rhinitis   . Amaurosis fugax 08/07/2012  . Anxiety   . Ascending aortic aneurysm ([place]) 12/07/2015  . Benign essential tremor syndrome   . Carotid stenosis    1039 bilateral by dopplers 03/2017.   . colon ca dx'd 1970   surg only  . DDD (degenerative disc disease)   . Diverticulitis   . Diverticulosis   . DM (diabetes mellitus) ([place])   . Duodenitis    peptic, with gastric heterotopia  . Endometriosis    s/p hysterectomy  . Fundic gland polyposis of stomach   . GERD (gastroesophageal reflux disease)   . Heart murmur   . Hiatal hernia 02/03/05  . History of cerebral aneurysm repair    s/p coiling  . History of hemorrhoids    with bleeding  . History of shingles   . HLD (hyperlipidemia)   . HTN (hypertension)   . Hypercholesteremia   . IBS (irritable bowel syndrome)   . Iron deficiency   . Migraine   . MVP (mitral valve prolapse)   . Osteopenia   . Peripheral neuropathy   . Toe fracture, right    second toe  . UTI (lower urinary tract infection)   . Varicose vein     Social History   Socioeconomic History  . Marital status: Married    Spouse name: Not on file  . Number of children: 0  . Years of education: Not on file  . Highest education level: Not on file  Occupational History  . Occupation: retired    Fish farm manager: RETIRED  Social Needs  . Financial resource strain: Not on file  . Food insecurity:    Worry: Not on file    Inability: Not on file  . Transportation needs:    Medical: Not on file    Non-medical: Not on file  Tobacco Use  . Smoking status: Never Smoker  . Smokeless tobacco: Never Used  Substance and Sexual Activity  . Alcohol use: No  . Drug use: No  . Sexual activity: Not on file  Lifestyle  . Physical activity:    Days per week: Not on file    Minutes per session: Not on file  . Stress: Not  on file  Relationships  . Social connections:    Talks on phone: Not on file    Gets together: Not on file    Attends religious service: Not on file    Active member of club or organization: Not on file  Attends meetings of clubs or organizations: Not on file    Relationship status: Not on file  . Intimate partner violence:    Fear of current or ex partner: Not on file    Emotionally abused: Not on file    Physically abused: Not on file    Forced sexual activity: Not on file  Other Topics Concern  . Not on file  Social History Narrative  . Not on file     Family History  Problem Relation Age of Onset  . Ovarian cancer Mother   . Heart disease Father   . Kidney disease Father   . Diabetes Paternal Grandmother   . Diabetes Brother   . Hypertension Brother   . Heart disease Brother   . Diabetes Brother   . Heart disease Brother   . Microcephaly Brother   . Diabetes Other   . Colon cancer Other      Review of Systems: General: negative for chills, fever, night sweats or weight changes.  Cardiovascular: negative for chest pain, dyspnea on exertion, edema, orthopnea, palpitations, paroxysmal nocturnal dyspnea or shortness of breath Dermatological: negative for rash Respiratory: negative for cough or wheezing Urologic: negative for hematuria Abdominal: negative for nausea, vomiting, diarrhea, bright red blood per rectum, melena, or hematemesis Neurologic: negative for visual changes, syncope, or dizziness All other systems reviewed and are otherwise negative except as noted above.    Blood pressure (!) 142/62, pulse (!) 59, height 5\' 3"  (1.6 m), weight 153 lb 6.4 oz (69.6 kg), SpO2 98 %.  General appearance: alert, cooperative and no distress Neck: no carotid bruit and no JVD Lungs: clear to auscultation bilaterally Heart: regular rate and rhythm and 2/6 systolic murmur Extremities: no edema Skin: Skin color, texture, turgor normal. No rashes or lesions Neurologic:  Grossly normal   ASSESSMENT AND PLAN:   Near syncope Suspect this was secondary to bradycardia  Essential hypertension Controlled- beta blocker changed to low dose lopressor  Insulin dependent diabetes mellitus ([place]) On Lantus   PLAN  Same Rx- f/u with Dr Radford Pax.  Kerin Ransom PA-C 02/08/2018 4:56 PM

## 2018-02-08 NOTE — Assessment & Plan Note (Signed)
Suspect this was secondary to bradycardia

## 2018-02-08 NOTE — Assessment & Plan Note (Signed)
On Lantus

## 2018-02-08 NOTE — Assessment & Plan Note (Signed)
Controlled- beta blocker changed to low dose lopressor

## 2018-02-23 ENCOUNTER — Other Ambulatory Visit: Payer: Self-pay | Admitting: Internal Medicine

## 2018-03-26 ENCOUNTER — Other Ambulatory Visit: Payer: Self-pay | Admitting: Cardiology

## 2018-04-30 ENCOUNTER — Other Ambulatory Visit: Payer: Self-pay | Admitting: Surgery

## 2018-04-30 DIAGNOSIS — I7121 Aneurysm of the ascending aorta, without rupture: Secondary | ICD-10-CM

## 2018-04-30 DIAGNOSIS — I712 Thoracic aortic aneurysm, without rupture, unspecified: Secondary | ICD-10-CM

## 2018-05-21 ENCOUNTER — Telehealth: Payer: Self-pay

## 2018-05-21 NOTE — Progress Notes (Signed)
Virtual Visit via Video Note   This visit type was conducted due to national recommendations for restrictions regarding the COVID-19 Pandemic (e.g. social distancing) in an effort to limit this patient's exposure and mitigate transmission in our community.  Due to her co-morbid illnesses, this patient is at least at moderate risk for complications without adequate follow up.  This format is felt to be most appropriate for this patient at this time.  All issues noted in this document were discussed and addressed.  A limited physical exam was performed with this format.  Please refer to the patient's chart for her consent to telehealth for [facility].  Evaluation Performed:  Follow-up visit  This visit type was conducted due to national recommendations for restrictions regarding the COVID-19 Pandemic (e.g. social distancing).  This format is felt to be most appropriate for this patient at this time.  All issues noted in this document were discussed and addressed.  No physical exam was performed (except for noted visual exam findings with Video Visits).  Please refer to the patient's chart (MyChart message for video visits and phone note for telephone visits) for the patient's consent to telehealth for [facility].  Date:  05/22/2018   ID:  [name], DOB 05/22/1932, MRN [id number]  Patient Location:  Home  Provider location:   [place]  PCP:  Antony Contras, MD  Cardiologist:  Fransico Him, MD Electrophysiologist:  None   Chief Complaint:  HTN, hyperlipidemia, MVP, ascending aortic aneursym, chronic CP/SOB  History of Present Illness:    [name] is a 85 y.o. female who presents via audio/video conferencing for a telehealth visit today.    [name] is a 85 y.o. female with a hx of HTN, dyslipdiemia, MVP, PUD with hiatal hernia and Type II DM and thoracic ascending aortic aneurysm at 4.6cm by chest CT angio 2017 and 4.2cm by CT angio 2018. She also has a history  of cerebral aneurysm s/p coiling.  She has chronic CP and SOB with no ischemia no nuclear stress test and normal LVF on echo.  She also had a lot of stress and anxiety related to her husband's dementia and deconditioning status.  She has a history of possible TIA in the past.  She had an Event monitor placed but only wore it 4 days because of a reaction to the pads.  She had no arrhythmia during that time.   She was admitted 01/15/2018 with near syncope.  Apparently she became very diapohretic and had speech problems.  She never fell down.  2D echo 12/2017 showed normal LVF with EF 60-65%, G1DD, mild MR and mild pulmonary HTN with PASP 55mmHg. Telemetry showed bradycardia at night and Lopressor was decreased. She was seen back by Kerin Ransom, PA and had not had any further dizziness.  She says that she had an episode of pain in her left arm and numbness in her left hand a while after seeing Kerin Ransom, [place].  She talked to her PCP and called EMS to do an EKG and it was normal so she never saw her PCP.  She denied any CP, SOB, diaphoresis or nausea.  She says that since working out the physical therapy and changing her BP meds she has felt much better and BP has been better controlled.    She is here today for followup and is doing well.  She has chronic DOE that she thinks is due to inactivity and lack of exercise since she has  to care for her husband who is debilitated.  She denies any chest pain or pressure, PND, orthopnea, dizziness, palpitations or syncope. She has chronic LE edema which is stable and unchanged.  She is compliant with her meds and is tolerating meds with no SE.  She says that she feels the best she has felt in a long time.   The patient does not have symptoms concerning for COVID-19 infection (fever, chills, cough, or new shortness of breath).    Prior CV studies:   The following studies were reviewed today:  2D echo 12/2017  Past Medical History:  Diagnosis Date  . Abdominal  pain, chronic, left lower quadrant   . Acute torn meniscus of knee   . Adenomatous colon polyp 1970    carcinoma in situ  . Allergic rhinitis   . Amaurosis fugax 08/07/2012  . Anxiety   . Ascending aortic aneurysm ([place]) 12/07/2015  . Benign essential tremor syndrome   . Carotid stenosis    1039 bilateral by dopplers 03/2017.   . colon ca dx'd 1970   surg only  . DDD (degenerative disc disease)   . Diverticulitis   . Diverticulosis   . DM (diabetes mellitus) ([place])   . Duodenitis    peptic, with gastric heterotopia  . Endometriosis    s/p hysterectomy  . Fundic gland polyposis of stomach   . GERD (gastroesophageal reflux disease)   . Heart murmur   . Hiatal hernia 02/03/05  . History of cerebral aneurysm repair    s/p coiling  . History of hemorrhoids    with bleeding  . History of shingles   . HLD (hyperlipidemia)   . HTN (hypertension)   . Hypercholesteremia   . IBS (irritable bowel syndrome)   . Iron deficiency   . Migraine   . MVP (mitral valve prolapse)   . Osteopenia   . Peripheral neuropathy   . Toe fracture, right    second toe  . UTI (lower urinary tract infection)   . Varicose vein    Past Surgical History:  Procedure Laterality Date  . ABDOMINAL HYSTERECTOMY    . APPENDECTOMY    . CARPAL TUNNEL RELEASE  08/26/07  . CATARACT EXTRACTION    . CEREBRAL ANEURYSM REPAIR    . COLON RESECTION    . COLONOSCOPY  06/07/08   divertiulosis, internal hemorrhoids  . COLONOSCOPY W/ BIOPSIES AND POLYPECTOMY  02/03/05   diverticulosis, 4 mm sessile polyps, internal and external hemorrhoids  . CORONARY ANGIOPLASTY WITH STENT PLACEMENT    . ESOPHAGOGASTRODUODENOSCOPY  02/03/05   hiatal hernia, 6 benign gastric polyps  . FLEXIBLE SIGMOIDOSCOPY    . HAND SURGERY Right   . INTRAOCULAR LENS INSERTION    . right hand decompressive fasciotomy  08/26/07   , dorsal and volar  . TONSILLECTOMY       Current Meds  Medication Sig  . acetaminophen (TYLENOL) 500 MG tablet Take 500 mg by  mouth every 6 (six) hours as needed for mild pain, moderate pain, fever or headache.  . ALPRAZolam (XANAX) 0.5 MG tablet Take 0.5 mg by mouth at bedtime.   . Cholecalciferol (VITAMIN D-3) 1000 UNITS CAPS Take 3 capsules by mouth at bedtime.   . hydrALAZINE (APRESOLINE) 10 MG tablet Take 1 tablet (10 mg total) by mouth every 8 (eight) hours.  . hydrocortisone (ANUSOL-HC) 2.5 % rectal cream Apply to perianal area 3 times daily.  . Insulin Glargine (LANTUS SOLOSTAR) 100 UNIT/ML SOPN Inject 26 Units into the  skin daily with breakfast.   . metoprolol tartrate (LOPRESSOR) 25 MG tablet Take 0.5 tablets (12.5 mg total) by mouth 2 (two) times daily.  . Multiple Vitamin (MULTIVITAMIN) tablet Take 1 tablet by mouth daily.    . pantoprazole (PROTONIX) 40 MG tablet TAKE 1 TABLET (40 MG TOTAL) BY MOUTH DAILY BEFORE BREAKFAST.  Marland Kitchen spironolactone (ALDACTONE) 25 MG tablet TAKE 1/2 TABLET BY MOUTH EVERY DAY (Patient taking differently: Take 12.5 mg by mouth daily. )  . trandolapril (MAVIK) 4 MG tablet TAKE 1 TABLET (4 MG TOTAL) BY MOUTH 2 (TWO) TIMES DAILY.     Allergies:   Actos [pioglitazone]; Avandia [rosiglitazone]; Fosamax [alendronate sodium]; Morphine; Ultram [tramadol]; Glimepiride; Januvia [sitagliptin]; Lipitor [atorvastatin]; Metformin and related; Prandin [repaglinide]; Tradjenta [linagliptin]; Zoloft [sertraline hcl]; Aspirin; Bentyl [dicyclomine hcl]; Ciprofloxacin; Hctz [hydrochlorothiazide]; Keflex [cephalexin]; Morphine and related; Norvasc [amlodipine besylate]; Oxycodone; and Penicillins   Social History   Tobacco Use  . Smoking status: Never Smoker  . Smokeless tobacco: Never Used  Substance Use Topics  . Alcohol use: No  . Drug use: No     Family Hx: The patient's family history includes Colon cancer in an other family member; Diabetes in her brother, brother, paternal grandmother, and another family member; Heart disease in her brother, brother, and father; Hypertension in her brother;  Kidney disease in her father; Microcephaly in her brother; Ovarian cancer in her mother.  ROS:   Please see the history of present illness.    2D echo 12/2017 All other systems reviewed and are negative.   Labs/Other Tests and Data Reviewed:    Recent Labs: 01/15/2018: BUN 20; Creatinine, Ser 0.92; Hemoglobin 12.1; Platelets 203; Potassium 4.0; Sodium 142 01/16/2018: TSH 1.257   Recent Lipid Panel Lab Results  Component Value Date/Time   CHOL 127 01/16/2018 02:33 AM   TRIG 72 01/16/2018 02:33 AM   HDL 29 (L) 01/16/2018 02:33 AM   CHOLHDL 4.4 01/16/2018 02:33 AM   LDLCALC 84 01/16/2018 02:33 AM    Wt Readings from Last 3 Encounters:  05/22/18 153 lb 3.2 oz (69.5 kg)  02/08/18 153 lb 6.4 oz (69.6 kg)  01/15/18 155 lb (70.3 kg)     Objective:    Vital Signs:  BP 136/71   Pulse (!) 58   Ht 5\' 3"  (1.6 m)   Wt 153 lb 3.2 oz (69.5 kg)   BMI 27.14 kg/m    CONSTITUTIONAL:  Well nourished, well developed female in no acute distress.  EYES: anicteric MOUTH: oral mucosa is pink RESPIRATORY: Normal respiratory effort, symmetric expansion CARDIOVASCULAR: No peripheral edema SKIN: No rash, lesions or ulcers MUSCULOSKELETAL: no digital cyanosis NEURO: Cranial Nerves II-XII grossly intact, moves all extremities PSYCH: Intact judgement and insight.  A&O x 3, Mood/affect appropriate   ASSESSMENT & PLAN:    1.  Ascending aortic aneurysm - her last assessment was by Chest CTA a year ago which was stable compared to a study in 2017 at 4.3cm.  Her BP is controlled.  She is statin intolerant.  This is followed by Dr. Cyndia Bent and she is scheduled for repeat CT scan in May.  2.  HTN - she checks her BP at home on occasion and it has been controlled. She will continue on spiro 12.5mg  daily, Hydralazine 10mg  q8 hrs, lopressor 12.5mg  BID and Mavik 4mg  BID. Her creatinine was 0.92 on 12/2017.   3.  Palpitations - she has a hx of TIA in 2018 and event monitor 07/2017 showed PVCs. She has not  had any palpitations in some time.   4.  Mild carotid artery stenosis -  Her last dopplers showed 1-39% bilateral stenosis 12/2017. She is statin intolerant.   5.  Insulin dependent DM - this is followed by her PCP.  She will continue on Lantus Insulin 26 units.  Her last HbA1C was 6.1 on 12/2017.   6.  COVID-19 Education:The signs and symptoms of COVID-19 were discussed with the patient and how to seek care for testing (follow up with PCP or arrange E-visit).  The importance of social distancing was discussed today.  Patient Risk:   After full review of this patient's clinical status, I feel that they are at least moderate risk at this time.  Time:   Today, I have spent 20 minutes directly with the patient on video discussing medical problems including HTN, thoracic aneurysm, TIA, DM.  We also reviewed the symptoms of COVID 19 and the ways to protect against contracting the virus with telehealth technology.  I spent an additional 10 minutes reviewing patient's chart including hospitalization 12/2017 and 2D echo at that time, labs.  Medication Adjustments/Labs and Tests Ordered: Current medicines are reviewed at length with the patient today.  Concerns regarding medicines are outlined above.  Tests Ordered: No orders of the defined types were placed in this encounter.  Medication Changes: No orders of the defined types were placed in this encounter.   Disposition:  Follow up in 1 year(s)  Signed, Fransico Him, MD  05/22/2018 2:31 PM    [place]

## 2018-05-21 NOTE — Telephone Encounter (Signed)
Virtual Visit Pre-Appointment Phone Call  Steps For Call:  1. Confirm consent - "In the setting of the current Covid19 crisis, you are scheduled for a phone  visit with your provider on 05/22/18 at 2:20 pm.  Just as we do with many in-office visits, in order for you to participate in this visit, we must obtain consent.  If you'd like, I can send this to your mychart (if signed up) or email for you to review.  Otherwise, I can obtain your verbal consent now.  All virtual visits are billed to your insurance company just like a normal visit would be.  By agreeing to a virtual visit, we'd like you to understand that the technology does not allow for your provider to perform an examination, and thus may limit your provider's ability to fully assess your condition. If your provider identifies any concerns that need to be evaluated in person, we will make arrangements to do so.  Finally, though the technology is pretty good, we cannot assure that it will always work on either your or our end, and in the setting of a video visit, we may have to convert it to a phone-only visit.  In either situation, we cannot ensure that we have a secure connection.  Are you willing to proceed?" STAFF: Did the patient verbally acknowledge consent to telehealth visit? Document YES/NO here: YES  2. Confirm the BEST phone number to call the day of the visit by including in appointment notes  3. Give patient instructions for WebEx/MyChart download to smartphone as below or Doximity/Doxy.me if video visit (depending on what platform provider is using)  4. Advise patient to be prepared with their blood pressure, heart rate, weight, any heart rhythm information, their current medicines, and a piece of paper and pen handy for any instructions they may receive the day of their visit  5. Inform patient they will receive a phone call 15 minutes prior to their appointment time (may be from unknown caller ID) so they should be prepared  to answer  6. Confirm that appointment type is correct in Epic appointment notes (VIDEO vs PHONE)     TELEPHONE CALL NOTE  [name] has been deemed a candidate for a follow-up tele-health visit to limit community exposure during the Covid-19 pandemic. I spoke with the patient via phone to ensure availability of phone/video source, confirm preferred email & phone number, and discuss instructions and expectations.  I reminded [name] to be prepared with any vital sign and/or heart rhythm information that could potentially be obtained via home monitoring, at the time of her visit. I reminded [name] to expect a phone call at the time of her visit if her visit.  Hailey Ill, RN 05/21/2018 9:56 AM   INSTRUCTIONS FOR DOWNLOADING THE WEBEX APP TO SMARTPHONE  - If Apple, ask patient to go to App Store and type in WebEx in the search bar. [employer], the blue/green circle. If Android, go to [employer] and type in [employer] in the search bar. The app is free but as with any other app downloads, their phone may require them to verify saved payment information or Apple/Android password.  - The patient does NOT have to create an account. - On the day of the visit, the assist will walk the patient through joining the meeting with the meeting number/password.  INSTRUCTIONS FOR DOWNLOADING THE MYCHART APP TO SMARTPHONE  - The patient must first  make sure to have activated MyChart and know their login information - If Apple, go to [employer] and type in MyChart in the search bar and download the app. If Android, ask patient to go to [employer] and type in [place] in the search bar and download the app. The app is free but as with any other app downloads, their phone may require them to verify saved payment information or Apple/Android password.  - The patient will need to then log into the app with their MyChart username and password, and select [facility] as their healthcare provider to link the account. When it is time for your visit, go to the MyChart app, find appointments, and click Begin Video Visit. Be sure to Select Allow for your device to access the Microphone and Camera for your visit. You will then be connected, and your provider will be with you shortly.  **If they have any issues connecting, or need assistance please contact MyChart service desk [phone])**  **If using a computer, in order to ensure the best quality for their visit they will need to use either of the following Internet Browsers: [employer], or Google Chrome**  IF USING DOXIMITY or DOXY.ME - The patient will receive a link just prior to their visit, either by text or email (to be determined day of appointment depending on if it's doxy.me or Doximity).     FULL LENGTH CONSENT FOR TELE-HEALTH VISIT   I hereby voluntarily request, consent and authorize [facility] and its employed or contracted physicians, physician assistants, nurse practitioners or other licensed health care professionals (the Practitioner), to provide me with telemedicine health care services (the "Services") as deemed necessary by the treating Practitioner. I acknowledge and consent to receive the Services by the Practitioner via telemedicine. I understand that the telemedicine visit will involve communicating with the Practitioner through live audiovisual communication technology and the disclosure of certain medical information by electronic transmission. I acknowledge that I have been given the opportunity to request an in-person assessment or other available alternative prior to the telemedicine visit and am voluntarily participating in the telemedicine visit.  I understand that I have the right to withhold or withdraw my consent to the use of telemedicine in the course of my care at any time, without affecting my right to future care or treatment, and that the  Practitioner or I may terminate the telemedicine visit at any time. I understand that I have the right to inspect all information obtained and/or recorded in the course of the telemedicine visit and may receive copies of available information for a reasonable fee.  I understand that some of the potential risks of receiving the Services via telemedicine include:  Marland Kitchen Delay or interruption in medical evaluation due to technological equipment failure or disruption; . Information transmitted may not be sufficient (e.g. poor resolution of images) to allow for appropriate medical decision making by the Practitioner; and/or  . In rare instances, security protocols could fail, causing a breach of personal health information.  Furthermore, I acknowledge that it is my responsibility to provide information about my medical history, conditions and care that is complete and accurate to the best of my ability. I acknowledge that Practitioner's advice, recommendations, and/or decision may be based on factors not within their control, such as incomplete or inaccurate data provided by me or distortions of diagnostic images or specimens that may result from electronic transmissions. I understand that the practice of medicine is not an  exact science and that Practitioner makes no warranties or guarantees regarding treatment outcomes. I acknowledge that I will receive a copy of this consent concurrently upon execution via email to the email address I last provided but may also request a printed copy by calling the office of [facility].    I understand that my insurance will be billed for this visit.   I have read or had this consent read to me. . I understand the contents of this consent, which adequately explains the benefits and risks of the Services being provided via telemedicine.  . I have been provided ample opportunity to ask questions regarding this consent and the Services and have had my questions answered to my  satisfaction. . I give my informed consent for the services to be provided through the use of telemedicine in my medical care  By participating in this telemedicine visit I agree to the above.

## 2018-05-22 ENCOUNTER — Other Ambulatory Visit: Payer: Self-pay

## 2018-05-22 ENCOUNTER — Encounter: Payer: Self-pay | Admitting: Cardiology

## 2018-05-22 ENCOUNTER — Telehealth (INDEPENDENT_AMBULATORY_CARE_PROVIDER_SITE_OTHER): Payer: Medicare Other | Admitting: Cardiology

## 2018-05-22 VITALS — BP 136/71 | HR 58 | Ht 63.0 in | Wt 153.2 lb

## 2018-05-22 DIAGNOSIS — Z794 Long term (current) use of insulin: Secondary | ICD-10-CM

## 2018-05-22 DIAGNOSIS — R002 Palpitations: Secondary | ICD-10-CM | POA: Diagnosis not present

## 2018-05-22 DIAGNOSIS — I10 Essential (primary) hypertension: Secondary | ICD-10-CM

## 2018-05-22 DIAGNOSIS — I712 Thoracic aortic aneurysm, without rupture: Secondary | ICD-10-CM | POA: Diagnosis not present

## 2018-05-22 DIAGNOSIS — I7121 Aneurysm of the ascending aorta, without rupture: Secondary | ICD-10-CM

## 2018-05-22 DIAGNOSIS — R55 Syncope and collapse: Secondary | ICD-10-CM

## 2018-05-22 DIAGNOSIS — E119 Type 2 diabetes mellitus without complications: Secondary | ICD-10-CM

## 2018-05-22 DIAGNOSIS — I6523 Occlusion and stenosis of bilateral carotid arteries: Secondary | ICD-10-CM

## 2018-05-22 DIAGNOSIS — IMO0001 Reserved for inherently not codable concepts without codable children: Secondary | ICD-10-CM

## 2018-05-22 NOTE — Patient Instructions (Signed)

## 2018-06-06 ENCOUNTER — Other Ambulatory Visit: Payer: Self-pay | Admitting: Nurse Practitioner

## 2018-06-20 ENCOUNTER — Telehealth: Payer: Self-pay | Admitting: Cardiology

## 2018-06-20 NOTE — Telephone Encounter (Signed)
Spoke with the patient, ever since she started taking hydralazine she has had arm pain and numbness in her hand. She brushed it off for a little while because she did not know it could be related to the medication. She went to physical therapy trying to fix the problem, but nothing has improved. She started the medication in the hospital in December. She requested an alterative to hydralazine to see if the medication is the problem.

## 2018-06-20 NOTE — Telephone Encounter (Signed)
I would like her to see her PCP.  This is not a common side effect of hydralazine and could be an ortho or neuro issue

## 2018-06-20 NOTE — Telephone Encounter (Signed)
New message:    Patient calling concerning a medication reaction. Patient is having pain in her left arm tingling as well.please call patient.

## 2018-06-21 NOTE — Telephone Encounter (Signed)
Pt aware of Dr Theodosia Blender recommendations.  Pt states she will follow-up with her PCP.  Pt gracious for all the assistance provided.

## 2018-06-27 ENCOUNTER — Other Ambulatory Visit: Payer: Medicare Other

## 2018-06-27 ENCOUNTER — Ambulatory Visit: Payer: Medicare Other | Admitting: Surgery

## 2018-06-28 ENCOUNTER — Ambulatory Visit: Payer: Medicare Other | Admitting: Surgery

## 2018-07-31 ENCOUNTER — Other Ambulatory Visit: Payer: Medicare Other

## 2018-07-31 ENCOUNTER — Ambulatory Visit: Payer: Medicare Other | Admitting: Surgery

## 2018-08-01 ENCOUNTER — Other Ambulatory Visit: Payer: Medicare Other

## 2018-08-01 ENCOUNTER — Ambulatory Visit: Payer: Medicare Other | Admitting: Surgery

## 2018-09-05 ENCOUNTER — Ambulatory Visit: Payer: Medicare Other | Admitting: Surgery

## 2018-09-05 ENCOUNTER — Other Ambulatory Visit: Payer: Medicare Other

## 2018-09-24 ENCOUNTER — Other Ambulatory Visit: Payer: Self-pay | Admitting: Cardiology

## 2019-02-18 ENCOUNTER — Other Ambulatory Visit: Payer: Self-pay

## 2019-02-18 MED ORDER — PANTOPRAZOLE SODIUM 40 MG PO TBEC: 40.0000 mg | DELAYED_RELEASE_TABLET | Freq: Every day | ORAL | 0 refills | Status: DC

## 2019-03-17 ENCOUNTER — Other Ambulatory Visit: Payer: Self-pay | Admitting: Nurse Practitioner

## 2019-04-04 ENCOUNTER — Ambulatory Visit: Payer: Medicare PPO

## 2019-04-17 ENCOUNTER — Telehealth: Payer: Self-pay | Admitting: Cardiology

## 2019-04-17 NOTE — Telephone Encounter (Signed)
I spoke with patient. She reports numbness and no feeling in her left hand. Pain in entire left arm from hand to shoulder.  Pain is not constant.  Has swelling in left wrist. Swelling and numbness are constant.  She feels weak all over. About 2-3 weeks ago she picked up a cup with her left hand and dropped it due to weakness. She is not having any facial droop or slurred speech.  Left arm/hand symptoms not new.  Previous phone note from 06/20/18 notes arm pain and numbness Patient states her diabetic doctor told her this is not neuropathy. Patient reports she checked with pharmacist and was given information that indicates hydralazine may be causing her symptoms. Patient did not take hydralazine this morning and reports she is not going to take it anymore. Reports BP has been around 130/70 and heart rate around 60

## 2019-04-17 NOTE — Telephone Encounter (Signed)
Patient needs to see PCP ASAP

## 2019-04-17 NOTE — Telephone Encounter (Signed)
New Message  Pt c/o medication issue:  1. Name of Medication: hydrALAZINE (APRESOLINE) 10 MG tablet  2. How are you currently taking this medication (dosage and times per day)? As written  3. Are you having a reaction (difficulty breathing--STAT)? Real weak, numbness in left side, no feeling in left arm  4. What is your medication issue? Classic allergic reaction to hydralazine. Has not taken it since yesterday

## 2019-04-22 NOTE — Telephone Encounter (Signed)
Spoke with patient who states that she stopped taking her hydralazine last week. She states that she is still having swelling and numbness in her left hand. She reports that she is still having some weakness but is feeling better. She is not having anymore dizziness.  She has seen her PCP about this in the past who recommended that she see ortho however she did not follow through with this. She has also had home PT in the past. Patient states that she is not going to any appointments until after she receives her 2nd vaccine on 04/12 but she will reach out to her PCP again. She states that for now she can tolerate the symptoms.  She states that her BP today was 150/71. She reports having low HR recently. This morning it was 49 bpm, this afternoon it was 60 bpm.  I advised her on importance of keeping BP under control but she states she would like to stay off of hydralazine for at least another week to see how she feels. She states she keeps a log of her BP and will call us with those readings.

## 2019-04-22 NOTE — Telephone Encounter (Signed)
Hydralazine does not cause arm numbness and swelling - agree with PCP that she needs to see ortho.  BP too high please have her start back on Hydralazine 10mg  BID and check BP daily for a week and call with results

## 2019-04-23 MED ORDER — HYDRALAZINE HCL 10 MG PO TABS: 10.0000 mg | ORAL_TABLET | Freq: Two times a day (BID) | ORAL | 0 refills | Status: DC

## 2019-04-23 NOTE — Telephone Encounter (Signed)
Patient agreed to restart hydralazine at 10 mg BID and check BP readings and call us with results. She will follow up with her PCP.

## 2019-04-29 ENCOUNTER — Telehealth: Payer: Self-pay | Admitting: Cardiology

## 2019-04-29 NOTE — Telephone Encounter (Signed)
   Pt c/o BP issue: STAT if pt c/o blurred vision, one-sided weakness or slurred speech  1. What are your last 5 BP readings?  148/64 HR 61 135/70 HR 55 133/68 HR 57 149/77 HR 58 139/69 HR 58 134/72 HR 54 142/62 HR 62 144/66 HR 58 147/70 HR 68 130/64 HR 65 134/61 HR 63 147/67 HR 60 136/71 HR 56, 145/69 HR 59, 142/67 HR 55,  132/69 HR 54,  134/74 HR 56  2. Are you having any other symptoms (ex. Dizziness, headache, blurred vision, passed out)? Always have dizziness when taking BP meds, no feeling on his left hand and swelling on wrist   3. What is your BP issue? Pt would like to provide BP to Dr. Radford Pax and would like to speak with her nurse to discuss

## 2019-04-29 NOTE — Telephone Encounter (Signed)
Since HR is in the 50's most of the time please stop lopressor and increase Hydralazine to 10mg  TID.  Repeat BP checks daily at lunch for a week and call with results   [name]

## 2019-04-29 NOTE — Telephone Encounter (Signed)
Patient called to give [name] her BP readings from the past week. She states that she gets dizzy and weak after taking all of her medications in the morning. She states that this is nothing new though. She does not feel like she is going to pass out when this happens. She will be follow up with her PCP soon in regards to hand numbness and swelling.

## 2019-05-01 MED ORDER — HYDRALAZINE HCL 10 MG PO TABS: 10.0000 mg | ORAL_TABLET | Freq: Three times a day (TID) | ORAL | 3 refills | Status: DC

## 2019-05-01 NOTE — Telephone Encounter (Signed)
Patient made aware of medication changes. She will recheck blood pressures daily for one week and call back with results.

## 2019-05-07 NOTE — Telephone Encounter (Signed)
Patient called with BP/HR readings after change in medications. She states that she has been feeling fine, still has weakness but no new symptoms. Advised patient to continue on medications and I would forward readings to Dr. Radford Pax for review. Patient verbalized understanding.

## 2019-05-07 NOTE — Telephone Encounter (Signed)
Pt c/o BP issue: STAT if pt c/o blurred vision, one-sided weakness or slurred speech  1. What are your last 5 BP readings?  04/06:        BP- 147/68 HR- 83 04/05:        BP- 147/63 HR- 82 04/04:        BP- 157/75 HR- 87        BP- 147/80 HR- 90 04/03:        BP- 155/75 HR- 75 04/02:       BP- 144/74  HR- 73  04/01:      BP- 138/69  HR- 61   2. Are you having any other symptoms (ex. Dizziness, headache, blurred vision, passed out)? Weakness  3. What is your BP issue? Patient states she is calling to report BP readings to Dr. Theodosia Blender nurse, Carly.

## 2019-05-08 NOTE — Telephone Encounter (Signed)
BP mildly elevated but with occasional weakness recommend continuing current meds.  I think it would be a good idea to see PCP to make sure no other etiology for weakness

## 2019-05-09 NOTE — Telephone Encounter (Signed)
Spoke with patient on advisement from Dr. Radford Pax to continue current medications and follow up with her PCP. Patient verbalized understanding.

## 2019-05-10 ENCOUNTER — Other Ambulatory Visit: Payer: Self-pay | Admitting: Internal Medicine

## 2019-05-20 ENCOUNTER — Encounter: Payer: Self-pay | Admitting: Cardiology

## 2019-05-20 NOTE — Telephone Encounter (Signed)
error 

## 2019-05-23 ENCOUNTER — Ambulatory Visit: Payer: Medicare Other | Admitting: Cardiology

## 2019-06-19 NOTE — Progress Notes (Signed)
Date:  06/20/2019   ID:  CANON ORIORDAN, DOB 04/20/1932, MRN [id number]  PCP:  Antony Contras, MD  Cardiologist:  Fransico Him, MD Electrophysiologist:  None   Chief Complaint:  HTN, hyperlipidemia, MVP, ascending aortic aneursym, chronic CP/SOB  History of Present Illness:     ALIANY Black is a 86 y.o. female with a hx of HTN, dyslipdiemia, MVP, PUD with hiatal hernia and Type II DM and thoracic ascending aortic aneurysm at 4.6cm by chest CT angio 2017 and 4.2cm by CT angio 2018. She also has a history of cerebral aneurysm s/p coiling.  She has chronic CP and SOB with no ischemia on nuclear stress test and normal LVF on echo.  She also had a lot of stress and anxiety related to her husband's dementia and deconditioning status.  She has a history of possible TIA in the past.  She had an Event monitor placed but only wore it 4 days because of a reaction to the pads.  She had no arrhythmia during that time.   She was admitted 01/15/2018 with near syncope.  Apparently she became very diapohretic and had speech problems.  She never fell down.  2D echo 12/2017 showed normal LVF with EF 60-65%, G1DD, mild MR and mild pulmonary HTN with PASP 28mmHg. Telemetry showed bradycardia at night and Lopressor was decreased. She was seen back by Kerin Ransom, PA and had not had any further dizziness.    She is here today for followup. She has chronic DOE secondary to inactivity and lack of exercise since she has to care for her husband who is debilitated. She thinks the SOB has gotten worse and sometimes at night she has to get up out of bed and go sit in the recliner.  She continues to have episodes of chest pain that are sporadic.  She has pain during the night as well.  She has no associated nausea or diaphoresis with the pain.  She does get SOB with the pain.  She also has been having problems with feeling weak in the am and has to lay back down.  She has checked her BP some when she is weak and BP is  140/53mmHg and HR will be 101bpm.  She denies any LE edema,  palpitations or syncope. She is compliant with her meds and is tolerating meds with no SE.    The patient does not have symptoms concerning for COVID-19 infection (fever, chills, cough, or new shortness of breath).   Prior CV studies:   The following studies were reviewed today:  EKG  Past Medical History:  Diagnosis Date  . Abdominal pain, chronic, left lower quadrant   . Acute torn meniscus of knee   . Adenomatous colon polyp 1970    carcinoma in situ  . Allergic rhinitis   . Amaurosis fugax 08/07/2012  . Anxiety   . Ascending aortic aneurysm ([place]) 12/07/2015  . Benign essential tremor syndrome   . Carotid stenosis    1039 bilateral by dopplers 03/2017.   . colon ca dx'd 1970   surg only  . DDD (degenerative disc disease)   . Diverticulitis   . Diverticulosis   . DM (diabetes mellitus) ([place])   . Duodenitis    peptic, with gastric heterotopia  . Endometriosis    s/p hysterectomy  . Fundic gland polyposis of stomach   . GERD (gastroesophageal reflux disease)   . Heart murmur   . Hiatal hernia 02/03/05  . History of cerebral aneurysm repair  s/p coiling  . History of hemorrhoids    with bleeding  . History of shingles   . HLD (hyperlipidemia)   . HTN (hypertension)   . Hypercholesteremia   . IBS (irritable bowel syndrome)   . Iron deficiency   . Migraine   . MVP (mitral valve prolapse)   . Osteopenia   . Peripheral neuropathy   . Toe fracture, right    second toe  . UTI (lower urinary tract infection)   . Varicose vein    Past Surgical History:  Procedure Laterality Date  . ABDOMINAL HYSTERECTOMY    . APPENDECTOMY    . CARPAL TUNNEL RELEASE  08/26/07  . CATARACT EXTRACTION    . CEREBRAL ANEURYSM REPAIR    . COLON RESECTION    . COLONOSCOPY  06/07/08   divertiulosis, internal hemorrhoids  . COLONOSCOPY W/ BIOPSIES AND POLYPECTOMY  02/03/05   diverticulosis, 4 mm sessile polyps, internal and external  hemorrhoids  . CORONARY ANGIOPLASTY WITH STENT PLACEMENT    . ESOPHAGOGASTRODUODENOSCOPY  02/03/05   hiatal hernia, 6 benign gastric polyps  . FLEXIBLE SIGMOIDOSCOPY    . HAND SURGERY Right   . INTRAOCULAR LENS INSERTION    . right hand decompressive fasciotomy  08/26/07   , dorsal and volar  . TONSILLECTOMY       Current Meds  Medication Sig  . acetaminophen (TYLENOL) 500 MG tablet Take 500 mg by mouth every 6 (six) hours as needed for mild pain, moderate pain, fever or headache.  . ALPRAZolam (XANAX) 0.5 MG tablet Take 0.5 mg by mouth at bedtime.   . Cholecalciferol (VITAMIN D-3) 1000 UNITS CAPS Take 3 capsules by mouth at bedtime.   . hydrALAZINE (APRESOLINE) 10 MG tablet Take 1 tablet (10 mg total) by mouth 3 (three) times daily.  . hydrocortisone (ANUSOL-HC) 2.5 % rectal cream Apply to perianal area 3 times daily.  . Insulin Glargine (LANTUS SOLOSTAR) 100 UNIT/ML SOPN Inject 26 Units into the skin daily with breakfast.   . Multiple Vitamin (MULTIVITAMIN) tablet Take 1 tablet by mouth daily.    . pantoprazole (PROTONIX) 40 MG tablet TAKE 1 TABLET BY MOUTH DAILY BEFORE BREAKFAST  . spironolactone (ALDACTONE) 25 MG tablet TAKE 1/2 TABLET BY MOUTH EVERY DAY  . trandolapril (MAVIK) 4 MG tablet TAKE 1 TABLET (4 MG TOTAL) BY MOUTH 2 (TWO) TIMES DAILY.     Allergies:   Actos [pioglitazone], Avandia [rosiglitazone], Fosamax [alendronate sodium], Morphine, Ultram [tramadol], Glimepiride, Januvia [sitagliptin], Lipitor [atorvastatin], Metformin and related, Prandin [repaglinide], Tradjenta [linagliptin], Zoloft [sertraline hcl], Aspirin, Bentyl [dicyclomine hcl], Ciprofloxacin, Hctz [hydrochlorothiazide], Keflex [cephalexin], Morphine and related, Norvasc [amlodipine besylate], Oxycodone, and Penicillins   Social History   Tobacco Use  . Smoking status: Never Smoker  . Smokeless tobacco: Never Used  Substance Use Topics  . Alcohol use: No  . Drug use: No     Family Hx: The patient's  family history includes Colon cancer in an other family member; Diabetes in her brother, brother, paternal grandmother, and another family member; Heart disease in her brother, brother, and father; Hypertension in her brother; Kidney disease in her father; Microcephaly in her brother; Ovarian cancer in her mother.  ROS:   Please see the history of present illness.    2D echo 12/2017 All other systems reviewed and are negative.   Labs/Other Tests and Data Reviewed:    Recent Labs: No results found for requested labs within last 8760 hours.   Recent Lipid Panel Lab Results  Component  Value Date/Time   CHOL 127 01/16/2018 02:33 AM   TRIG 72 01/16/2018 02:33 AM   HDL 29 (L) 01/16/2018 02:33 AM   CHOLHDL 4.4 01/16/2018 02:33 AM   LDLCALC 84 01/16/2018 02:33 AM    Wt Readings from Last 3 Encounters:  06/20/19 154 lb 3.2 oz (69.9 kg)  05/22/18 153 lb 3.2 oz (69.5 kg)  02/08/18 153 lb 6.4 oz (69.6 kg)     Objective:    Vital Signs:  BP 126/62   Pulse 93   Ht 5\' 3"  (1.6 m)   Wt 154 lb 3.2 oz (69.9 kg)   BMI 27.32 kg/m    GEN: Well nourished, well developed in no acute distress HEENT: Normal NECK: No JVD; No carotid bruits LYMPHATICS: No lymphadenopathy CARDIAC:RRR, no murmurs, rubs, gallops RESPIRATORY:  Clear to auscultation without rales, wheezing or rhonchi  ABDOMEN: Soft, non-tender, non-distended MUSCULOSKELETAL:  No edema; No deformity  SKIN: Warm and dry NEUROLOGIC:  Alert and oriented x 3 PSYCHIATRIC:  Normal affect   EKG is reviewed today and showed NSR with nonspecific T wave abnormality   ASSESSMENT & PLAN:    1.  Ascending aortic aneurysm  -stable by Chest CTA 2019  at 4.3cm and 22mm by echo 2019. -BP is well controlled -not on statin due to intolerance.   -she has been followed by Dr. Cyndia Bent in the past but has not seen him recently -will repeat chest CTA to make sure this is stable  2.  HTN  -BP controlled on exam -continue Hydralazine 10mg  TID,   Mavik 4mg  BID and Spir 12.5mg  daily -creatinine stable at 1.02 and K+ 5.1  3.  Palpitations  -she has a hx of TIA in 2018 and event monitor 07/2017 showed PVCs. -no further palpitations since I saw her last   4.  Mild carotid artery stenosis  - Her last dopplers showed 1-39% bilateral stenosis 12/2017.  -repeat dopplers 01/2020 -statin intolerant  5.  Insulin dependent DM  -this is followed by her PCP -continue on Insulin  6.  Chest pain -this has been ongoing but seems worse now -not sure some of it is not related to stress and anxiety -EKG is nonischemic -she has no associated sx except for SOB -I will get a Lexiscan myoview to rule out ischemia  7.  DOE -this is chronic but seems to be getting worse -it occurs both with exertion and at night -I will get a 2D echo to assess further -Hbg was 11.8 in March and TSH was normal last fall  Medication Adjustments/Labs and Tests Ordered: Current medicines are reviewed at length with the patient today.  Concerns regarding medicines are outlined above.  Tests Ordered: Orders Placed This Encounter  Procedures  . EKG 12-Lead   Medication Changes: No orders of the defined types were placed in this encounter.   Disposition:  Follow up in 1 year(s)  Signed, Fransico Him, MD  06/20/2019 9:07 AM    [facility]

## 2019-06-20 ENCOUNTER — Encounter: Payer: Self-pay | Admitting: Cardiology

## 2019-06-20 ENCOUNTER — Ambulatory Visit: Payer: Medicare PPO | Admitting: Cardiology

## 2019-06-20 ENCOUNTER — Other Ambulatory Visit: Payer: Self-pay

## 2019-06-20 VITALS — BP 126/62 | HR 93 | Ht 63.0 in | Wt 154.2 lb

## 2019-06-20 DIAGNOSIS — I10 Essential (primary) hypertension: Secondary | ICD-10-CM

## 2019-06-20 DIAGNOSIS — R809 Proteinuria, unspecified: Secondary | ICD-10-CM

## 2019-06-20 DIAGNOSIS — R002 Palpitations: Secondary | ICD-10-CM

## 2019-06-20 DIAGNOSIS — E1029 Type 1 diabetes mellitus with other diabetic kidney complication: Secondary | ICD-10-CM | POA: Diagnosis not present

## 2019-06-20 DIAGNOSIS — I6523 Occlusion and stenosis of bilateral carotid arteries: Secondary | ICD-10-CM | POA: Diagnosis not present

## 2019-06-20 DIAGNOSIS — I7121 Aneurysm of the ascending aorta, without rupture: Secondary | ICD-10-CM

## 2019-06-20 DIAGNOSIS — I712 Thoracic aortic aneurysm, without rupture, unspecified: Secondary | ICD-10-CM

## 2019-06-20 NOTE — Patient Instructions (Signed)
Medication Instructions:  Your physician recommends that you continue on your current medications as directed. Please refer to the Current Medication list given to you today.  *If you need a refill on your cardiac medications before your next appointment, please call your pharmacy*  Testing/Procedures: Your physician has requested that you have an echocardiogram. Echocardiography is a painless test that uses sound waves to create images of your heart. It provides your doctor with information about the size and shape of your heart and how well your heart's chambers and valves are working. This procedure takes approximately one hour. There are no restrictions for this procedure.  Your physician has requested that you have a carotid duplex in December 2021. This test is an ultrasound of the carotid arteries in your neck. It looks at blood flow through these arteries that supply the brain with blood. Allow one hour for this exam. There are no restrictions or special instructions.  Your physician has requested that you have a lexiscan myoview. For further information please visit [email or web address]. Please follow instruction sheet, as given.  Your physician has requested that you have a Chest CT scan.    Follow-Up: At [facility], you and your health needs are our priority.  As part of our continuing mission to provide you with exceptional heart care, we have created designated Provider Care Teams.  These Care Teams include your primary Cardiologist (physician) and Advanced Practice Providers (APPs -  Physician Assistants and Nurse Practitioners) who all work together to provide you with the care you need, when you need it.  We recommend signing up for the patient portal called "MyChart".  Sign up information is provided on this After Visit Summary.  MyChart is used to connect with patients for Virtual Visits (Telemedicine).  Patients are able to view lab/test results, encounter notes, upcoming  appointments, etc.  Non-urgent messages can be sent to your provider as well.   To learn more about what you can do with MyChart, go to [email or web address].    Your next appointment:   1 year(s)  The format for your next appointment:   In Person  Provider:   You may see Fransico Him, MD or one of the following Advanced Practice Providers on your designated Care Team:    Melina Copa, PA-C  Ermalinda Barrios, PA-C

## 2019-06-20 NOTE — Addendum Note (Signed)
Addended by: Antonieta Iba on: 06/20/2019 09:32 AM   Modules accepted: Orders

## 2019-06-24 ENCOUNTER — Other Ambulatory Visit: Payer: Self-pay | Admitting: Cardiology

## 2019-06-24 ENCOUNTER — Other Ambulatory Visit: Payer: Self-pay

## 2019-06-24 DIAGNOSIS — I10 Essential (primary) hypertension: Secondary | ICD-10-CM

## 2019-07-16 ENCOUNTER — Other Ambulatory Visit: Payer: Medicare PPO

## 2019-07-16 ENCOUNTER — Encounter: Payer: Self-pay | Admitting: Cardiology

## 2019-07-16 ENCOUNTER — Ambulatory Visit (HOSPITAL_COMMUNITY): Payer: Medicare PPO | Attending: Cardiovascular Disease

## 2019-07-16 ENCOUNTER — Other Ambulatory Visit: Payer: Self-pay

## 2019-07-16 DIAGNOSIS — I6523 Occlusion and stenosis of bilateral carotid arteries: Secondary | ICD-10-CM | POA: Insufficient documentation

## 2019-07-16 DIAGNOSIS — R002 Palpitations: Secondary | ICD-10-CM | POA: Diagnosis not present

## 2019-07-16 DIAGNOSIS — Q231 Congenital insufficiency of aortic valve: Secondary | ICD-10-CM | POA: Insufficient documentation

## 2019-07-16 DIAGNOSIS — I7121 Aneurysm of the ascending aorta, without rupture: Secondary | ICD-10-CM

## 2019-07-16 DIAGNOSIS — E1029 Type 1 diabetes mellitus with other diabetic kidney complication: Secondary | ICD-10-CM | POA: Diagnosis not present

## 2019-07-16 DIAGNOSIS — R809 Proteinuria, unspecified: Secondary | ICD-10-CM | POA: Insufficient documentation

## 2019-07-16 DIAGNOSIS — I10 Essential (primary) hypertension: Secondary | ICD-10-CM | POA: Insufficient documentation

## 2019-07-16 DIAGNOSIS — I712 Thoracic aortic aneurysm, without rupture, unspecified: Secondary | ICD-10-CM

## 2019-07-17 LAB — BASIC METABOLIC PANEL
BUN/Creatinine Ratio: 29 — ABNORMAL HIGH (ref 12–28)
BUN: 27 mg/dL (ref 8–27)
CO2: 24 mmol/L (ref 20–29)
Calcium: 9.5 mg/dL (ref 8.7–10.3)
Chloride: 103 mmol/L (ref 96–106)
Creatinine, Ser: 0.92 mg/dL (ref 0.57–1.00)
GFR calc Af Amer: 66 mL/min/{1.73_m2} (ref >59)
GFR calc non Af Amer: 57 mL/min/{1.73_m2} — ABNORMAL LOW (ref >59)
Glucose: 113 mg/dL — ABNORMAL HIGH (ref 65–99)
Potassium: 4.8 mmol/L (ref 3.5–5.2)
Sodium: 140 mmol/L (ref 134–144)

## 2019-07-22 ENCOUNTER — Inpatient Hospital Stay: Admission: RE | Admit: 2019-07-22 | Payer: Medicare PPO | Source: Ambulatory Visit

## 2019-07-24 ENCOUNTER — Telehealth: Payer: Self-pay | Admitting: *Deleted

## 2019-07-24 ENCOUNTER — Other Ambulatory Visit: Payer: Self-pay

## 2019-07-24 ENCOUNTER — Ambulatory Visit (INDEPENDENT_AMBULATORY_CARE_PROVIDER_SITE_OTHER)
Admission: RE | Admit: 2019-07-24 | Discharge: 2019-07-24 | Disposition: A | Discharge status: Home / Self Care | Payer: Medicare PPO | Source: Ambulatory Visit | Attending: Cardiology | Admitting: Cardiology

## 2019-07-24 DIAGNOSIS — I712 Thoracic aortic aneurysm, without rupture, unspecified: Secondary | ICD-10-CM

## 2019-07-24 MED ORDER — IOHEXOL 350 MG/ML SOLN
100.0000 mL | Freq: Once | INTRAVENOUS | Status: AC | PRN
  Administered 2019-07-24: 100 mL via INTRAVENOUS

## 2019-07-24 NOTE — Telephone Encounter (Signed)
Pt notified of results. Order placed for one year follow up CTA. Pt reports this had previously been followed by Dr Cyndia Bent but CTA was ordered by Dr Radford Pax as she has not seen Dr Cyndia Bent recently.  Patient is asking if she needs to see Dr Cyndia Bent of if aortic aneurysm can be followed by Dr Radford Pax.

## 2019-07-24 NOTE — Telephone Encounter (Signed)
-----   Message from Sueanne Margarita, MD sent at 07/24/2019  3:22 PM EDT ----- Chest CTA showed stable ascending aortic aneurysm measuring 59mm unchanged from 2017.  The main pulmonary artery is mildly enlarged but no pulmonary HTN on recent echo.  Atherosclerosis of the aorta noted.  Repeat Chest CTA in 1 year for aortic aneurysm

## 2019-07-25 ENCOUNTER — Telehealth: Payer: Self-pay | Admitting: Cardiology

## 2019-07-25 NOTE — Telephone Encounter (Signed)
Advised patient that she does not need to follow up with Dr. Cyndia Bent at this time.

## 2019-07-25 NOTE — Telephone Encounter (Signed)
Patient wants to know if it is okay to delay her stress test. Please advise.

## 2019-07-25 NOTE — Telephone Encounter (Signed)
Spoke with the patient who states that she has been really busy helping to get home health come in for her husband. She would like to hold off on the stress test until August. I have rescheduled the patient's stress test.

## 2019-07-25 NOTE — Telephone Encounter (Signed)
I think we are ok to follow this for now but if it increases she will need to see Dr. Cyndia Bent back

## 2019-08-04 ENCOUNTER — Other Ambulatory Visit: Payer: Self-pay | Admitting: Internal Medicine

## 2019-08-06 DIAGNOSIS — M25532 Pain in left wrist: Secondary | ICD-10-CM | POA: Insufficient documentation

## 2019-08-06 DIAGNOSIS — G5602 Carpal tunnel syndrome, left upper limb: Secondary | ICD-10-CM | POA: Diagnosis not present

## 2019-08-14 ENCOUNTER — Encounter (HOSPITAL_COMMUNITY): Payer: Medicare PPO

## 2019-08-30 ENCOUNTER — Telehealth (HOSPITAL_COMMUNITY): Payer: Self-pay | Admitting: Cardiology

## 2019-08-30 NOTE — Telephone Encounter (Signed)
Patient has cancelled Myocardial Test on 08/14/19 and 09/23/2019 and state that she has other medical issues that she needs to take care of at this time and does not wish to reschedule. Order will be removed from the [place] and if she calls to reschedule we will reinstate the order. Patient cancelled with Scheduler.

## 2019-09-19 DIAGNOSIS — E559 Vitamin D deficiency, unspecified: Secondary | ICD-10-CM | POA: Diagnosis not present

## 2019-09-19 DIAGNOSIS — Z0000 Encounter for general adult medical examination without abnormal findings: Secondary | ICD-10-CM | POA: Diagnosis not present

## 2019-09-19 DIAGNOSIS — I10 Essential (primary) hypertension: Secondary | ICD-10-CM | POA: Diagnosis not present

## 2019-09-19 DIAGNOSIS — K589 Irritable bowel syndrome without diarrhea: Secondary | ICD-10-CM | POA: Diagnosis not present

## 2019-09-19 DIAGNOSIS — K219 Gastro-esophageal reflux disease without esophagitis: Secondary | ICD-10-CM | POA: Diagnosis not present

## 2019-09-19 DIAGNOSIS — E782 Mixed hyperlipidemia: Secondary | ICD-10-CM | POA: Diagnosis not present

## 2019-09-19 DIAGNOSIS — D509 Iron deficiency anemia, unspecified: Secondary | ICD-10-CM | POA: Diagnosis not present

## 2019-09-19 DIAGNOSIS — N1831 Chronic kidney disease, stage 3a: Secondary | ICD-10-CM | POA: Diagnosis not present

## 2019-09-19 DIAGNOSIS — F419 Anxiety disorder, unspecified: Secondary | ICD-10-CM | POA: Diagnosis not present

## 2019-09-23 ENCOUNTER — Encounter (HOSPITAL_COMMUNITY): Payer: Medicare PPO

## 2019-09-24 DIAGNOSIS — E1165 Type 2 diabetes mellitus with hyperglycemia: Secondary | ICD-10-CM | POA: Diagnosis not present

## 2019-09-24 DIAGNOSIS — M8580 Other specified disorders of bone density and structure, unspecified site: Secondary | ICD-10-CM | POA: Diagnosis not present

## 2019-09-24 DIAGNOSIS — Z794 Long term (current) use of insulin: Secondary | ICD-10-CM | POA: Diagnosis not present

## 2019-09-24 DIAGNOSIS — E1142 Type 2 diabetes mellitus with diabetic polyneuropathy: Secondary | ICD-10-CM | POA: Diagnosis not present

## 2019-09-24 DIAGNOSIS — E11319 Type 2 diabetes mellitus with unspecified diabetic retinopathy without macular edema: Secondary | ICD-10-CM | POA: Diagnosis not present

## 2019-10-04 DIAGNOSIS — Z780 Asymptomatic menopausal state: Secondary | ICD-10-CM | POA: Diagnosis not present

## 2019-10-04 DIAGNOSIS — M8589 Other specified disorders of bone density and structure, multiple sites: Secondary | ICD-10-CM | POA: Diagnosis not present

## 2019-11-06 ENCOUNTER — Ambulatory Visit: Payer: Self-pay | Admitting: Podiatry

## 2019-11-07 ENCOUNTER — Other Ambulatory Visit: Payer: Self-pay | Admitting: Internal Medicine

## 2019-12-11 ENCOUNTER — Other Ambulatory Visit: Payer: Self-pay | Admitting: Nurse Practitioner

## 2020-02-03 ENCOUNTER — Other Ambulatory Visit: Payer: Self-pay | Admitting: Internal Medicine

## 2020-02-10 ENCOUNTER — Ambulatory Visit (HOSPITAL_COMMUNITY)
Admission: RE | Admit: 2020-02-10 | Payer: Medicare PPO | Source: Ambulatory Visit | Attending: Cardiology | Admitting: Cardiology

## 2020-02-27 DIAGNOSIS — K589 Irritable bowel syndrome without diarrhea: Secondary | ICD-10-CM | POA: Diagnosis not present

## 2020-02-27 DIAGNOSIS — R399 Unspecified symptoms and signs involving the genitourinary system: Secondary | ICD-10-CM | POA: Diagnosis not present

## 2020-03-02 ENCOUNTER — Encounter (HOSPITAL_COMMUNITY): Payer: Medicare PPO

## 2020-03-26 DIAGNOSIS — D509 Iron deficiency anemia, unspecified: Secondary | ICD-10-CM | POA: Diagnosis not present

## 2020-03-26 DIAGNOSIS — E782 Mixed hyperlipidemia: Secondary | ICD-10-CM | POA: Diagnosis not present

## 2020-03-26 DIAGNOSIS — I10 Essential (primary) hypertension: Secondary | ICD-10-CM | POA: Diagnosis not present

## 2020-03-26 DIAGNOSIS — K219 Gastro-esophageal reflux disease without esophagitis: Secondary | ICD-10-CM | POA: Diagnosis not present

## 2020-03-26 DIAGNOSIS — N1831 Chronic kidney disease, stage 3a: Secondary | ICD-10-CM | POA: Diagnosis not present

## 2020-03-26 DIAGNOSIS — E559 Vitamin D deficiency, unspecified: Secondary | ICD-10-CM | POA: Diagnosis not present

## 2020-03-26 DIAGNOSIS — I671 Cerebral aneurysm, nonruptured: Secondary | ICD-10-CM | POA: Diagnosis not present

## 2020-03-26 DIAGNOSIS — K589 Irritable bowel syndrome without diarrhea: Secondary | ICD-10-CM | POA: Diagnosis not present

## 2020-03-26 DIAGNOSIS — F419 Anxiety disorder, unspecified: Secondary | ICD-10-CM | POA: Diagnosis not present

## 2020-03-30 DIAGNOSIS — Z794 Long term (current) use of insulin: Secondary | ICD-10-CM | POA: Diagnosis not present

## 2020-03-30 DIAGNOSIS — E1142 Type 2 diabetes mellitus with diabetic polyneuropathy: Secondary | ICD-10-CM | POA: Diagnosis not present

## 2020-03-30 DIAGNOSIS — E11319 Type 2 diabetes mellitus with unspecified diabetic retinopathy without macular edema: Secondary | ICD-10-CM | POA: Diagnosis not present

## 2020-03-30 DIAGNOSIS — M8580 Other specified disorders of bone density and structure, unspecified site: Secondary | ICD-10-CM | POA: Diagnosis not present

## 2020-04-15 ENCOUNTER — Other Ambulatory Visit: Payer: Self-pay

## 2020-04-15 ENCOUNTER — Ambulatory Visit: Payer: Medicare PPO | Admitting: Podiatry

## 2020-04-15 ENCOUNTER — Ambulatory Visit (INDEPENDENT_AMBULATORY_CARE_PROVIDER_SITE_OTHER): Payer: Medicare PPO

## 2020-04-15 ENCOUNTER — Encounter: Payer: Self-pay | Admitting: Podiatry

## 2020-04-15 DIAGNOSIS — G629 Polyneuropathy, unspecified: Secondary | ICD-10-CM | POA: Diagnosis not present

## 2020-04-15 DIAGNOSIS — L84 Corns and callosities: Secondary | ICD-10-CM | POA: Diagnosis not present

## 2020-04-15 DIAGNOSIS — M779 Enthesopathy, unspecified: Secondary | ICD-10-CM | POA: Diagnosis not present

## 2020-04-15 MED ORDER — TRIAMCINOLONE ACETONIDE 10 MG/ML IJ SUSP
10.0000 mg | Freq: Once | INTRAMUSCULAR | Status: AC
  Administered 2020-04-15: 10 mg

## 2020-04-18 NOTE — Progress Notes (Signed)
Subjective:   Patient ID: [name], female   DOB: 87 y.o.   MRN: [id number]   HPI Patient presents with pain in the ankle right moderate swelling of the ankle and foot that she states is been present for a long time and corn formation that becomes painful with ambulation.   Review of Systems  All other systems reviewed and are negative.       Objective:  Physical Exam Vitals and nursing note reviewed.  Constitutional:      Appearance: She is well-developed.  Pulmonary:     Effort: Pulmonary effort is normal.  Musculoskeletal:        General: Normal range of motion.  Skin:    General: Skin is warm.  Neurological:     Mental Status: She is alert.     Neurovascular status found to be intact muscle strength was found to be reduced with range of motion subtalar midtarsal joint reduced.  Patient has forefoot midfoot and ankle edema negative Bevelyn Buckles' sign was noted and this has been persistent for a fairly long time and acute discomfort in the sinus tarsi right with inflammation fluid upon palpation     Assessment:  Probability for venous swelling secondary to venous disease along with inflammatory capsulitis of the sinus tarsi right and plantar callus formation     Plan:  H&P educated condition sterile prep injected the sinus tarsi 3 mg Kenalog 5 mg Xylocaine advised on compression elevation and debrided callus formation.  Patient will be seen back  X-rays indicate that there is arthritis of the subtalar joint with moderate osteoporosis collapse medial longitudinal arch

## 2020-04-28 ENCOUNTER — Other Ambulatory Visit: Payer: Self-pay | Admitting: Internal Medicine

## 2020-04-28 DIAGNOSIS — I8001 Phlebitis and thrombophlebitis of superficial vessels of right lower extremity: Secondary | ICD-10-CM | POA: Diagnosis not present

## 2020-05-04 ENCOUNTER — Encounter (HOSPITAL_BASED_OUTPATIENT_CLINIC_OR_DEPARTMENT_OTHER): Payer: Self-pay

## 2020-05-04 ENCOUNTER — Emergency Department (HOSPITAL_BASED_OUTPATIENT_CLINIC_OR_DEPARTMENT_OTHER)
Admission: EM | Admit: 2020-05-04 | Discharge: 2020-05-04 | Disposition: A | Discharge status: Home / Self Care | Payer: Medicare PPO | Attending: Emergency Medicine | Admitting: Emergency Medicine

## 2020-05-04 ENCOUNTER — Other Ambulatory Visit: Payer: Self-pay

## 2020-05-04 ENCOUNTER — Emergency Department (HOSPITAL_BASED_OUTPATIENT_CLINIC_OR_DEPARTMENT_OTHER): Payer: Medicare PPO

## 2020-05-04 DIAGNOSIS — E119 Type 2 diabetes mellitus without complications: Secondary | ICD-10-CM | POA: Diagnosis not present

## 2020-05-04 DIAGNOSIS — Z794 Long term (current) use of insulin: Secondary | ICD-10-CM | POA: Insufficient documentation

## 2020-05-04 DIAGNOSIS — W01198A Fall on same level from slipping, tripping and stumbling with subsequent striking against other object, initial encounter: Secondary | ICD-10-CM | POA: Insufficient documentation

## 2020-05-04 DIAGNOSIS — R457 State of emotional shock and stress, unspecified: Secondary | ICD-10-CM | POA: Diagnosis not present

## 2020-05-04 DIAGNOSIS — I10 Essential (primary) hypertension: Secondary | ICD-10-CM | POA: Insufficient documentation

## 2020-05-04 DIAGNOSIS — S0990XA Unspecified injury of head, initial encounter: Secondary | ICD-10-CM | POA: Diagnosis not present

## 2020-05-04 DIAGNOSIS — Z79899 Other long term (current) drug therapy: Secondary | ICD-10-CM | POA: Insufficient documentation

## 2020-05-04 DIAGNOSIS — Z743 Need for continuous supervision: Secondary | ICD-10-CM | POA: Diagnosis not present

## 2020-05-04 DIAGNOSIS — W19XXXA Unspecified fall, initial encounter: Secondary | ICD-10-CM | POA: Diagnosis not present

## 2020-05-04 DIAGNOSIS — R6889 Other general symptoms and signs: Secondary | ICD-10-CM | POA: Diagnosis not present

## 2020-05-04 NOTE — ED Triage Notes (Signed)
Patient slipped in the wet grass and hit back of head on cement walkway. Patient denies any pain. No visible/palpable injuries to the back of head.

## 2020-05-04 NOTE — ED Provider Notes (Signed)
[facility] EMERGENCY DEPT Provider Note   CSN: [id number] Arrival date & time: 05/04/20  1948     History Chief Complaint  Patient presents with  . Fall  . Head Injury    No visible/palpable deformity to back of head.    [name] is a 87 y.o. female.  Presents to ER with concern for head injury.  Patient states that she slipped on some wet grass and struck the back of her head on a cement walkway.  Denies loss of consciousness, denies any pain.  Has been ambulatory without difficulty.  HPI     Past Medical History:  Diagnosis Date  . Abdominal pain, chronic, left lower quadrant   . Acute torn meniscus of knee   . Adenomatous colon polyp 1970    carcinoma in situ  . Allergic rhinitis   . Amaurosis fugax 08/07/2012  . Anxiety   . Ascending aortic aneurysm ([place]) 12/07/2015   74mm by echo 07/2019  . Benign essential tremor syndrome   . Bicuspid aortic valve    no AS on 07/2019  . Carotid stenosis    1039 bilateral by dopplers 03/2017.   . colon ca dx'd 1970   surg only  . DDD (degenerative disc disease)   . Diverticulitis   . Diverticulosis   . DM (diabetes mellitus) ([place])   . Duodenitis    peptic, with gastric heterotopia  . Endometriosis    s/p hysterectomy  . Fundic gland polyposis of stomach   . GERD (gastroesophageal reflux disease)   . Heart murmur   . Hiatal hernia 02/03/05  . History of cerebral aneurysm repair    s/p coiling  . History of hemorrhoids    with bleeding  . History of shingles   . HLD (hyperlipidemia)   . HTN (hypertension)   . Hypercholesteremia   . IBS (irritable bowel syndrome)   . Iron deficiency   . Migraine   . MVP (mitral valve prolapse)   . Osteopenia   . Peripheral neuropathy   . Toe fracture, right    second toe  . UTI (lower urinary tract infection)   . Varicose vein     Patient Active Problem List   Diagnosis Date Noted  . Bicuspid aortic valve   . Constipation 11/14/2017  . Rectal bleeding  11/14/2017  . Carotid stenosis   . Heart palpitations 03/23/2017  . Ascending aortic aneurysm ([place]) 12/07/2015  . SOB (shortness of breath) 12/07/2015  . Orthostatic hypotension 04/02/2014  . Hematochezia 04/02/2014  . Pain in the chest   . Near syncope 04/01/2014  . Insulin dependent diabetes mellitus 04/01/2014  . Diverticulosis 04/01/2014  . Unruptured cerebral aneurysm 01/08/2014  . Multifactorial gait disorder 01/08/2014  . Hereditary and idiopathic peripheral neuropathy 01/08/2014  . Aneurysm, cerebral, nonruptured 08/07/2012  . Amaurosis fugax 08/07/2012  . Unspecified hereditary and idiopathic peripheral neuropathy 08/07/2012  . Benign essential tremor syndrome   . Abnormal x-ray of lumbar spine 08/29/2010  . Low back pain 08/24/2010  . ANXIETY 03/26/2010  . Essential hypertension 03/26/2010  . Abdominal pain, bilateral lower quadrant 03/26/2010  . GERD 12/22/2008  . IRON DEFICIENCY 05/22/2008  . Irritable bowel syndrome 04/07/2008  . COLONIC POLYPS, ADENOMATOUS, HX OF 04/07/2008    Past Surgical History:  Procedure Laterality Date  . ABDOMINAL HYSTERECTOMY    . APPENDECTOMY    . CARPAL TUNNEL RELEASE  08/26/07  . CATARACT EXTRACTION    . CEREBRAL ANEURYSM REPAIR    .  COLON RESECTION    . COLONOSCOPY  06/07/08   divertiulosis, internal hemorrhoids  . COLONOSCOPY W/ BIOPSIES AND POLYPECTOMY  02/03/05   diverticulosis, 4 mm sessile polyps, internal and external hemorrhoids  . CORONARY ANGIOPLASTY WITH STENT PLACEMENT    . ESOPHAGOGASTRODUODENOSCOPY  02/03/05   hiatal hernia, 6 benign gastric polyps  . FLEXIBLE SIGMOIDOSCOPY    . HAND SURGERY Right   . INTRAOCULAR LENS INSERTION    . right hand decompressive fasciotomy  08/26/07   , dorsal and volar  . TONSILLECTOMY       OB History   No obstetric history on file.     Family History  Problem Relation Age of Onset  . Ovarian cancer Mother   . Heart disease Father   . Kidney disease Father   . Diabetes  Paternal Grandmother   . Diabetes Brother   . Hypertension Brother   . Heart disease Brother   . Diabetes Brother   . Heart disease Brother   . Microcephaly Brother   . Diabetes Other   . Colon cancer Other     Social History   Tobacco Use  . Smoking status: Never Smoker  . Smokeless tobacco: Never Used  Vaping Use  . Vaping Use: Never used  Substance Use Topics  . Alcohol use: No  . Drug use: No    Home Medications Prior to Admission medications   Medication Sig Start Date End Date Taking? Authorizing Provider  acetaminophen (TYLENOL) 500 MG tablet Take 500 mg by mouth every 6 (six) hours as needed for mild pain, moderate pain, fever or headache.    [provider]  ALPRAZolam Duanne Moron) 0.5 MG tablet Take 0.5 mg by mouth at bedtime.     [provider]  Cholecalciferol (VITAMIN D-3) 1000 UNITS CAPS Take 3 capsules by mouth at bedtime.     [provider]  hydrALAZINE (APRESOLINE) 10 MG tablet Take 1 tablet (10 mg total) by mouth 3 (three) times daily. 05/01/19   Sueanne Margarita, MD  hydrocortisone (ANUSOL-HC) 2.5 % rectal cream Apply to perianal area 3 times daily. 11/14/17   Zehr, Laban Emperor, PA-C  Insulin Glargine (LANTUS SOLOSTAR) 100 UNIT/ML SOPN Inject 26 Units into the skin daily with breakfast.     [provider]  Multiple Vitamin (MULTIVITAMIN) tablet Take 1 tablet by mouth daily.      [provider]  pantoprazole (PROTONIX) 40 MG tablet TAKE 1 TABLET BY MOUTH EVERY DAY BEFORE BREAKFAST 02/03/20   Gatha Mayer, MD  spironolactone (ALDACTONE) 25 MG tablet TAKE 1/2 TABLET BY MOUTH EVERY DAY 06/24/19   Sueanne Margarita, MD  trandolapril ([place]) 4 MG tablet TAKE 1 TABLET BY MOUTH TWICE A DAY 12/11/19   Burtis Junes, NP    Allergies    Actos [pioglitazone], Avandia [rosiglitazone], Fosamax [alendronate sodium], Morphine, Ultram [tramadol], Glimepiride, Januvia [sitagliptin], Lipitor [atorvastatin], Metformin and related, Prandin  [repaglinide], Tradjenta [linagliptin], Zoloft [sertraline hcl], Aspirin, Bentyl [dicyclomine hcl], Ciprofloxacin, Hctz [hydrochlorothiazide], Keflex [cephalexin], Morphine and related, Norvasc [amlodipine besylate], Oxycodone, Sulfa antibiotics, and Penicillins  Review of Systems   Review of Systems  All other systems reviewed and are negative.   Physical Exam Updated Vital Signs BP (!) 161/54   Pulse 67   Temp 98.6 F (37 C) (Temporal)   Resp 18   Ht 5\' 3"  (1.6 m)   Wt 66.7 kg   SpO2 100%   BMI 26.04 kg/m   Physical Exam Vitals and nursing note  reviewed.  Constitutional:      General: She is not in acute distress.    Appearance: She is well-developed.  HENT:     Head: Normocephalic and atraumatic.  Eyes:     Conjunctiva/sclera: Conjunctivae normal.  Cardiovascular:     Rate and Rhythm: Normal rate and regular rhythm.     Heart sounds: No murmur heard.   Pulmonary:     Effort: Pulmonary effort is normal. No respiratory distress.     Breath sounds: Normal breath sounds.  Abdominal:     Palpations: Abdomen is soft.     Tenderness: There is no abdominal tenderness.  Musculoskeletal:     Cervical back: Neck supple.     Comments: No C, T, L-spine tenderness to palpation or deformity, no tenderness to palpation throughout all 4 extremities  Skin:    General: Skin is warm and dry.  Neurological:     Mental Status: She is alert.     ED Results / Procedures / Treatments   Labs (all labs ordered are listed, but only abnormal results are displayed) Labs Reviewed - No data to display  EKG None  Radiology CT Head Wo Contrast  Result Date: 05/04/2020 CLINICAL DATA:  Fall, hit back of head EXAM: CT HEAD WITHOUT CONTRAST TECHNIQUE: Contiguous axial images were obtained from the base of the skull through the vertex without intravenous contrast. COMPARISON:  04/01/2014 FINDINGS: Brain: There is atrophy and chronic small vessel disease changes. No acute intracranial  abnormality. Specifically, no hemorrhage, hydrocephalus, mass lesion, acute infarction, or significant intracranial injury. Vascular: No hyperdense vessel or unexpected calcification. Clips are coils noted in the region of the right cavernous sinus, likely from prior cerebral aneurysm repair. This is unchanged. Skull: No acute calvarial abnormality. Sinuses/Orbits: No acute findings Other: None IMPRESSION: Atrophy, chronic microvascular disease. No acute intracranial abnormality. Electronically Signed   By: Rolm Baptise M.D.   On: 05/04/2020 20:29    Procedures Procedures   Medications Ordered in ED Medications - No data to display  ED Course  I have reviewed the triage vital signs and the nursing notes  Pertinent labs & imaging results that were available during my care of the patient were reviewed by me and considered in my medical decision making (see chart for details).    MDM Rules/Calculators/A&P                         87 year old lady presents to ER after mechanical fall, reported head trauma.  No visible deformity was noted on my exam.  No other trauma on careful assessment.  CT head was negative.  Discharged home.  After the discussed management above, the patient was determined to be safe for discharge.  The patient was in agreement with this plan and all questions regarding their care were answered.  ED return precautions were discussed and the patient will return to the ED with any significant worsening of condition.    Final Clinical Impression(s) / ED Diagnoses Final diagnoses:  Traumatic injury of head, initial encounter    Rx / DC Orders ED Discharge Orders    None       Lucrezia Starch, MD 05/04/20 2202

## 2020-05-04 NOTE — Discharge Instructions (Signed)
Take Tylenol or Motrin for pain control.  Return if you develop any nausea, vomiting, confusion or other new concerning symptom.

## 2020-05-07 DIAGNOSIS — I10 Essential (primary) hypertension: Secondary | ICD-10-CM | POA: Diagnosis not present

## 2020-05-07 DIAGNOSIS — J302 Other seasonal allergic rhinitis: Secondary | ICD-10-CM | POA: Diagnosis not present

## 2020-05-07 DIAGNOSIS — G6289 Other specified polyneuropathies: Secondary | ICD-10-CM | POA: Diagnosis not present

## 2020-05-07 DIAGNOSIS — R519 Headache, unspecified: Secondary | ICD-10-CM | POA: Diagnosis not present

## 2020-05-12 ENCOUNTER — Other Ambulatory Visit: Payer: Self-pay | Admitting: Cardiology

## 2020-05-12 DIAGNOSIS — I6523 Occlusion and stenosis of bilateral carotid arteries: Secondary | ICD-10-CM

## 2020-05-19 ENCOUNTER — Ambulatory Visit (HOSPITAL_COMMUNITY)
Admission: RE | Admit: 2020-05-19 | Payer: Medicare PPO | Source: Ambulatory Visit | Attending: Cardiology | Admitting: Cardiology

## 2020-05-20 ENCOUNTER — Institutional Professional Consult (permissible substitution): Payer: Medicare PPO | Admitting: Neurology

## 2020-06-01 ENCOUNTER — Other Ambulatory Visit: Payer: Self-pay | Admitting: Internal Medicine

## 2020-06-13 ENCOUNTER — Other Ambulatory Visit: Payer: Self-pay | Admitting: Cardiology

## 2020-06-15 ENCOUNTER — Ambulatory Visit: Payer: Medicare PPO | Admitting: Cardiology

## 2020-06-27 ENCOUNTER — Other Ambulatory Visit: Payer: Self-pay

## 2020-06-27 ENCOUNTER — Encounter (HOSPITAL_BASED_OUTPATIENT_CLINIC_OR_DEPARTMENT_OTHER): Payer: Self-pay | Admitting: Emergency Medicine

## 2020-06-27 ENCOUNTER — Emergency Department (HOSPITAL_BASED_OUTPATIENT_CLINIC_OR_DEPARTMENT_OTHER)
Admission: EM | Admit: 2020-06-27 | Discharge: 2020-06-27 | Disposition: A | Discharge status: Home / Self Care | Payer: Medicare PPO | Attending: Emergency Medicine | Admitting: Emergency Medicine

## 2020-06-27 DIAGNOSIS — R21 Rash and other nonspecific skin eruption: Secondary | ICD-10-CM | POA: Insufficient documentation

## 2020-06-27 DIAGNOSIS — E109 Type 1 diabetes mellitus without complications: Secondary | ICD-10-CM | POA: Diagnosis not present

## 2020-06-27 DIAGNOSIS — R300 Dysuria: Secondary | ICD-10-CM | POA: Insufficient documentation

## 2020-06-27 DIAGNOSIS — I10 Essential (primary) hypertension: Secondary | ICD-10-CM | POA: Insufficient documentation

## 2020-06-27 DIAGNOSIS — Z85038 Personal history of other malignant neoplasm of large intestine: Secondary | ICD-10-CM | POA: Diagnosis not present

## 2020-06-27 LAB — URINALYSIS, ROUTINE W REFLEX MICROSCOPIC
Bilirubin Urine: NEGATIVE
Glucose, UA: NEGATIVE mg/dL
Hgb urine dipstick: NEGATIVE
Ketones, ur: NEGATIVE mg/dL
Leukocytes,Ua: NEGATIVE
Nitrite: NEGATIVE
Protein, ur: NEGATIVE mg/dL
Specific Gravity, Urine: 1.019 (ref 1.005–1.030)
pH: 5.5 (ref 5.0–8.0)

## 2020-06-27 LAB — CBG MONITORING, ED: Glucose-Capillary: 165 mg/dL — ABNORMAL HIGH (ref 70–99)

## 2020-06-27 MED ORDER — DOXYCYCLINE HYCLATE 100 MG PO CAPS: 100.0000 mg | ORAL_CAPSULE | Freq: Two times a day (BID) | ORAL | 0 refills | Status: AC

## 2020-06-27 MED ORDER — PREDNISONE 20 MG PO TABS
40.0000 mg | ORAL_TABLET | Freq: Once | ORAL | Status: AC
  Administered 2020-06-27: 40 mg via ORAL
  Filled 2020-06-27: qty 2

## 2020-06-27 MED ORDER — DOXYCYCLINE HYCLATE 100 MG PO TABS
100.0000 mg | ORAL_TABLET | Freq: Once | ORAL | Status: AC
  Administered 2020-06-27: 100 mg via ORAL
  Filled 2020-06-27: qty 1

## 2020-06-27 MED ORDER — PREDNISONE 10 MG PO TABS: 20.0000 mg | ORAL_TABLET | Freq: Every day | ORAL | 0 refills | Status: AC

## 2020-06-27 NOTE — ED Notes (Signed)
Pt. placed in ED-14 from Triage, BP updated, MD in to see pt.Marland Kitchen

## 2020-06-27 NOTE — Discharge Instructions (Addendum)
Recommend using Goldbond and lidocaine cream as well as Desitin cream.  Take antibiotic as prescribed.  Take prednisone as prescribed.

## 2020-06-27 NOTE — ED Provider Notes (Signed)
[facility] EMERGENCY DEPT Provider Note   CSN: [id number] Arrival date & time: 06/27/20  1722     History Chief Complaint  Patient presents with  . Dysuria  . Rash    MASD depends     [name] is a 87 y.o. female.  Patient here some pain with urination.  Also having depends rash to her buttocks.  Has been using over-the-counter lidocaine/Goldbond with not much relief.  The history is provided by the patient.  Dysuria Pain quality:  Aching and burning Pain severity:  Mild Onset quality:  Gradual Timing:  Intermittent Progression:  Waxing and waning Chronicity:  New Relieved by:  Nothing Ineffective treatments:  None tried Associated symptoms: no abdominal pain, no fever, no flank pain, no genital lesions, no nausea, no vaginal discharge and no vomiting   Rash Associated symptoms: no abdominal pain, no fever, no joint pain, no nausea, no shortness of breath, no sore throat and not vomiting        Past Medical History:  Diagnosis Date  . Abdominal pain, chronic, left lower quadrant   . Acute torn meniscus of knee   . Adenomatous colon polyp 1970    carcinoma in situ  . Allergic rhinitis   . Amaurosis fugax 08/07/2012  . Anxiety   . Ascending aortic aneurysm ([place]) 12/07/2015   70mm by echo 07/2019  . Benign essential tremor syndrome   . Bicuspid aortic valve    no AS on 07/2019  . Carotid stenosis    1039 bilateral by dopplers 03/2017.   . colon ca dx'd 1970   surg only  . DDD (degenerative disc disease)   . Diverticulitis   . Diverticulosis   . DM (diabetes mellitus) ([place])   . Duodenitis    peptic, with gastric heterotopia  . Endometriosis    s/p hysterectomy  . Fundic gland polyposis of stomach   . GERD (gastroesophageal reflux disease)   . Heart murmur   . Hiatal hernia 02/03/05  . History of cerebral aneurysm repair    s/p coiling  . History of hemorrhoids    with bleeding  . History of shingles   . HLD (hyperlipidemia)   . HTN  (hypertension)   . Hypercholesteremia   . IBS (irritable bowel syndrome)   . Iron deficiency   . Migraine   . MVP (mitral valve prolapse)   . Osteopenia   . Peripheral neuropathy   . Toe fracture, right    second toe  . UTI (lower urinary tract infection)   . Varicose vein     Patient Active Problem List   Diagnosis Date Noted  . Bicuspid aortic valve   . Constipation 11/14/2017  . Rectal bleeding 11/14/2017  . Carotid stenosis   . Heart palpitations 03/23/2017  . Ascending aortic aneurysm ([place]) 12/07/2015  . SOB (shortness of breath) 12/07/2015  . Orthostatic hypotension 04/02/2014  . Hematochezia 04/02/2014  . Pain in the chest   . Near syncope 04/01/2014  . Insulin dependent diabetes mellitus 04/01/2014  . Diverticulosis 04/01/2014  . Unruptured cerebral aneurysm 01/08/2014  . Multifactorial gait disorder 01/08/2014  . Hereditary and idiopathic peripheral neuropathy 01/08/2014  . Aneurysm, cerebral, nonruptured 08/07/2012  . Amaurosis fugax 08/07/2012  . Unspecified hereditary and idiopathic peripheral neuropathy 08/07/2012  . Benign essential tremor syndrome   . Abnormal x-ray of lumbar spine 08/29/2010  . Low back pain 08/24/2010  . ANXIETY 03/26/2010  . Essential hypertension 03/26/2010  . Abdominal pain, bilateral lower quadrant  03/26/2010  . GERD 12/22/2008  . IRON DEFICIENCY 05/22/2008  . Irritable bowel syndrome 04/07/2008  . COLONIC POLYPS, ADENOMATOUS, HX OF 04/07/2008    Past Surgical History:  Procedure Laterality Date  . ABDOMINAL HYSTERECTOMY    . APPENDECTOMY    . CARPAL TUNNEL RELEASE  08/26/07  . CATARACT EXTRACTION    . CEREBRAL ANEURYSM REPAIR    . COLON RESECTION    . COLONOSCOPY  06/07/08   divertiulosis, internal hemorrhoids  . COLONOSCOPY W/ BIOPSIES AND POLYPECTOMY  02/03/05   diverticulosis, 4 mm sessile polyps, internal and external hemorrhoids  . CORONARY ANGIOPLASTY WITH STENT PLACEMENT    . ESOPHAGOGASTRODUODENOSCOPY  02/03/05    hiatal hernia, 6 benign gastric polyps  . FLEXIBLE SIGMOIDOSCOPY    . HAND SURGERY Right   . INTRAOCULAR LENS INSERTION    . right hand decompressive fasciotomy  08/26/07   , dorsal and volar  . TONSILLECTOMY       OB History   No obstetric history on file.     Family History  Problem Relation Age of Onset  . Ovarian cancer Mother   . Heart disease Father   . Kidney disease Father   . Diabetes Paternal Grandmother   . Diabetes Brother   . Hypertension Brother   . Heart disease Brother   . Diabetes Brother   . Heart disease Brother   . Microcephaly Brother   . Diabetes Other   . Colon cancer Other     Social History   Tobacco Use  . Smoking status: Never Smoker  . Smokeless tobacco: Never Used  Vaping Use  . Vaping Use: Never used  Substance Use Topics  . Alcohol use: No  . Drug use: No    Home Medications Prior to Admission medications   Medication Sig Start Date End Date Taking? Authorizing Provider  doxycycline (VIBRAMYCIN) 100 MG capsule Take 1 capsule (100 mg total) by mouth 2 (two) times daily for 7 days. 06/27/20 07/04/20 Yes Ubah Radke, DO  predniSONE (DELTASONE) 10 MG tablet Take 2 tablets (20 mg total) by mouth daily for 3 days. 06/27/20 06/30/20 Yes Idrissa Beville, DO  acetaminophen (TYLENOL) 500 MG tablet Take 500 mg by mouth every 6 (six) hours as needed for mild pain, moderate pain, fever or headache.    [provider]  ALPRAZolam Duanne Moron) 0.5 MG tablet Take 0.5 mg by mouth at bedtime.     [provider]  Cholecalciferol (VITAMIN D-3) 1000 UNITS CAPS Take 3 capsules by mouth at bedtime.     [provider]  hydrALAZINE (APRESOLINE) 10 MG tablet Take 1 tablet (10 mg total) by mouth 3 (three) times daily. 05/01/19   Sueanne Margarita, MD  hydrocortisone (ANUSOL-HC) 2.5 % rectal cream Apply to perianal area 3 times daily. 11/14/17   Zehr, Laban Emperor, PA-C  Insulin Glargine (LANTUS SOLOSTAR) 100 UNIT/ML SOPN Inject 26 Units into the  skin daily with breakfast.     [provider]  Multiple Vitamin (MULTIVITAMIN) tablet Take 1 tablet by mouth daily.      [provider]  pantoprazole (PROTONIX) 40 MG tablet TAKE 1 TABLET BY MOUTH EVERY DAY BEFORE BREAKFAST 02/03/20   Gatha Mayer, MD  spironolactone (ALDACTONE) 25 MG tablet Take 0.5 tablets (12.5 mg total) by mouth daily. Please schedule yearly appointment for future refills. Thank you 06/15/20   Sueanne Margarita, MD  trandolapril (MAVIK) 4 MG tablet TAKE 1 TABLET BY MOUTH TWICE A DAY 12/11/19  Burtis Junes, NP    Allergies    Actos [pioglitazone], Avandia [rosiglitazone], Fosamax [alendronate sodium], Morphine, Ultram [tramadol], Glimepiride, Januvia [sitagliptin], Lipitor [atorvastatin], Metformin and related, Prandin [repaglinide], Tradjenta [linagliptin], Zoloft [sertraline hcl], Aspirin, Bentyl [dicyclomine hcl], Ciprofloxacin, Hctz [hydrochlorothiazide], Keflex [cephalexin], Morphine and related, Norvasc [amlodipine besylate], Oxycodone, Sulfa antibiotics, and Penicillins  Review of Systems   Review of Systems  Constitutional: Negative for chills and fever.  HENT: Negative for ear pain and sore throat.   Eyes: Negative for pain and visual disturbance.  Respiratory: Negative for cough and shortness of breath.   Cardiovascular: Negative for chest pain and palpitations.  Gastrointestinal: Negative for abdominal pain, nausea and vomiting.  Genitourinary: Positive for dysuria. Negative for flank pain, hematuria and vaginal discharge.  Musculoskeletal: Negative for arthralgias and back pain.  Skin: Positive for rash. Negative for color change.  Neurological: Negative for seizures and syncope.  All other systems reviewed and are negative.   Physical Exam Updated Vital Signs BP (!) 130/99   Pulse 83   Temp 98 F (36.7 C) (Oral)   Resp 16   Ht 5' 2.5" (1.588 m)   Wt 64.9 kg   SpO2 98%   BMI 25.74 kg/m   Physical Exam Vitals and nursing  note reviewed.  Constitutional:      General: She is not in acute distress.    Appearance: She is well-developed. She is not ill-appearing.  HENT:     Head: Normocephalic and atraumatic.     Nose: Nose normal.     Mouth/Throat:     Mouth: Mucous membranes are moist.  Eyes:     Extraocular Movements: Extraocular movements intact.     Conjunctiva/sclera: Conjunctivae normal.     Pupils: Pupils are equal, round, and reactive to light.  Cardiovascular:     Rate and Rhythm: Normal rate and regular rhythm.     Heart sounds: Normal heart sounds. No murmur heard.   Pulmonary:     Effort: Pulmonary effort is normal. No respiratory distress.     Breath sounds: Normal breath sounds.  Abdominal:     Palpations: Abdomen is soft.     Tenderness: There is no abdominal tenderness.  Musculoskeletal:        General: Normal range of motion.     Cervical back: Normal range of motion and neck supple.  Skin:    General: Skin is warm and dry.     Capillary Refill: Capillary refill takes less than 2 seconds.     Findings: Rash present.     Comments: Patient has some skin breakdown around the buttocks bilaterally with some areas of ulceration but no fluctuance or purulent drainage  Neurological:     Mental Status: She is alert.     ED Results / Procedures / Treatments   Labs (all labs ordered are listed, but only abnormal results are displayed) Labs Reviewed  CBG MONITORING, ED - Abnormal; Notable for the following components:      Result Value   Glucose-Capillary 165 (*)    All other components within normal limits  URINE CULTURE  URINALYSIS, ROUTINE W REFLEX MICROSCOPIC    EKG None  Radiology No results found.  Procedures Procedures   Medications Ordered in ED Medications  doxycycline (VIBRA-TABS) tablet 100 mg (has no administration in time range)  predniSONE (DELTASONE) tablet 40 mg (has no administration in time range)    ED Course  I have reviewed the triage vital signs  and the nursing notes.  Pertinent labs & imaging results that were available during my care of the patient were reviewed by me and considered in my medical decision making (see chart for details).    MDM Rules/Calculators/A&P                          BRENDY FICEK is an 87 year old female with history of reflux, hypertension, high cholesterol presents to the ED with rash.  Also pain with urination.  Normal vitals.  No fever.  Has some skin breakdown on the buttocks area that is likely from contact dermatitis from her depends.  Has not had much improvement with over-the-counter medications.  After shared decision we will use doxycycline and prednisone to see if that would help.  Possibly could be a mild cellulitis in this area.  Urinalysis negative for infection.  Overall given reassurance and discharged in ED in good condition.  Recommend follow-up with primary care doctor for reevaluation.  No concern for deep space infection including Fournier's.  This chart was dictated using voice recognition software.  Despite best efforts to proofread,  errors can occur which can change the documentation meaning.    Final Clinical Impression(s) / ED Diagnoses Final diagnoses:  Rash    Rx / DC Orders ED Discharge Orders         Ordered    doxycycline (VIBRAMYCIN) 100 MG capsule  2 times daily        06/27/20 1945    predniSONE (DELTASONE) 10 MG tablet  Daily        06/27/20 1945           Lennice Sites, DO 06/27/20 1947

## 2020-06-27 NOTE — ED Triage Notes (Signed)
Reports rash to buttocks that attributes to wearing depends. States ongoing x1.5 weeks. States dysuria since last night, "vagina hurts."

## 2020-06-27 NOTE — ED Notes (Signed)
Pt verbalizes understanding of discharge instructions. Opportunity for questioning and answers were provided. Armand removed by staff, pt discharged from ED to home. Educated to pick up Rx.  

## 2020-06-27 NOTE — ED Notes (Signed)
Urine sample obtained/labelled/taken to Lab along with [facility] label for Urinalysis.

## 2020-06-27 NOTE — ED Notes (Addendum)
C/o dysuria and raw irritated perineum/vagina. Unable to void. Requesting PO fluids/given. Family at [facility]. Alert, NAD, calm, interactive.

## 2020-06-29 LAB — URINE CULTURE

## 2020-07-09 ENCOUNTER — Other Ambulatory Visit: Payer: Self-pay

## 2020-07-09 DIAGNOSIS — I712 Thoracic aortic aneurysm, without rupture, unspecified: Secondary | ICD-10-CM

## 2020-07-09 NOTE — Progress Notes (Signed)
New orders placed for chest CTA due to old orders expiring.

## 2020-07-13 ENCOUNTER — Telehealth: Payer: Self-pay | Admitting: Cardiology

## 2020-07-13 ENCOUNTER — Encounter (HOSPITAL_COMMUNITY): Payer: Self-pay | Admitting: Cardiology

## 2020-07-13 DIAGNOSIS — R072 Precordial pain: Secondary | ICD-10-CM

## 2020-07-13 MED ORDER — HYDRALAZINE HCL 25 MG PO TABS: 25.0000 mg | ORAL_TABLET | Freq: Three times a day (TID) | ORAL | 3 refills | Status: DC

## 2020-07-13 NOTE — Telephone Encounter (Signed)
Spoke with the patient and she will increase hydralazine to 25 mg TID. Leane Call has been ordered. Patient is aware that she will be called to get this scheduled.

## 2020-07-13 NOTE — Telephone Encounter (Signed)
Pt c/o BP issue: STAT if pt c/o blurred vision, one-sided weakness or slurred speech  1. What are your last 5 BP readings? 179/196 HR 85  2. Are you having any other symptoms (ex. Dizziness, headache, blurred vision, passed out)? Headache, sob and cp  3. What is your BP issue? high  ?

## 2020-07-13 NOTE — Telephone Encounter (Signed)
Spoke with the patient who reports that her BP has been elevated recently. She states that last night she had an episode of chest pain and back pain. Her blood pressure was 179/96. She states that she took a dose of her hydralazine and went a laid on the couch and pain went away. She states that she had no other associated symptoms. She states that today she is feeling good. Denies any chest pain, SOB, or dizziness. BP this morning was 135/65. She states that it has been running 140s/60s.

## 2020-07-14 NOTE — Telephone Encounter (Signed)
Patient is following up regarding stress test order, requesting to speak with Bethann Berkshire, RN. She states she will discuss in detail when her call is returned.

## 2020-07-14 NOTE — Telephone Encounter (Signed)
Shared Decision Making/Informed Consent The risks [chest pain, shortness of breath, cardiac arrhythmias, dizziness, blood pressure fluctuations, myocardial infarction, stroke/transient ischemic attack, nausea, vomiting, allergic reaction, radiation exposure, metallic taste sensation and life-threatening complications (estimated to be 1 in 10,000)], benefits (risk stratification, diagnosing coronary artery disease, treatment guidance) and alternatives of a nuclear stress test were discussed in detail with [name] and she agrees to proceed.

## 2020-07-14 NOTE — Telephone Encounter (Signed)
Spoke with the patient who has opted not to go through with the stress test. She states that even if a blockage was found that she would not be able to go through with surgery so she does not see the need to have testing done at this point. Patient has already cancelled stress test.

## 2020-07-15 ENCOUNTER — Telehealth (HOSPITAL_COMMUNITY): Payer: Self-pay | Admitting: Cardiology

## 2020-07-15 NOTE — Telephone Encounter (Signed)
Patient called and cancelled Myoview for reason Below:  07/14/2020 2:15 PM [id number], [name]  Cancel Rsn: Patient (pt dont need it, will see Dr. Radford Pax in 07/05)  Order will be removed from the [facility] and if patient needs in future we can reinstate order or create a new one. Thank you

## 2020-07-29 ENCOUNTER — Institutional Professional Consult (permissible substitution): Payer: Medicare PPO | Admitting: Neurology

## 2020-08-03 NOTE — Progress Notes (Signed)
Date:  08/04/2020   ID:  [name], DOB 1932-03-18, MRN [id number]  PCP:  Antony Contras, MD  Cardiologist:  Fransico Him, MD Electrophysiologist:  None   Chief Complaint:  HTN, hyperlipidemia, MVP, ascending aortic aneursym, chronic CP/SOB  History of Present Illness:    [name] is a 87 y.o. female with a hx of HTN, dyslipdiemia, MVP, PUD with hiatal hernia and Type II DM and thoracic ascending aortic aneurysm at 4.6cm by chest CT angio 2017 and 4.4cm by CT angio 07/2019.  She also has a history of cerebral aneurysm s/p coiling.  She has chronic CP and SOB with no ischemia on nuclear stress test and normal LVF on echo.   She also had a lot of stress and anxiety related to her husband's dementia and deconditioning status.  She has a history of possible TIA in the past.  She had an Event monitor placed but only wore it 4 days because of a reaction to the pads.  She had no arrhythmia during that time.   She was admitted 01/15/2018 with near syncope.  Apparently she became very diapohretic and had speech problems.  She never fell down.  2D echo 12/2017 showed normal LVF with EF 60-65%, G1DD, mild MR and mild pulmonary HTN with PASP 49mmHg. Telemetry showed bradycardia at night and Lopressor was decreased. She was seen back by Kerin Ransom, PA and had not had any further dizziness.    She has chronic DOE secondary to inactivity and lack of exercise since she has to care for her husband who is debilitated. At her last OV a year ago she though her SOB had gotten worse and continued to have episodes of chest pain.  2D echo showed normal LVF with BAV with no AS.  She was also supposed to have a [employer] but this was never done.    She is here today for followup and is doing well.  She still occasionally has some SOB at night and has to elevate her head but no SOB during the day unless she is rushing around. She denies any chest pain or pressure, PND, palpitations or syncope.  She  occasionally has some dizziness and LE edema.  She is compliant with his meds and is tolerating meds with no SE.     Prior CV studies:   The following studies were reviewed today:  EKG, 2D echo, Chest CTA  Past Medical History:  Diagnosis Date   Abdominal pain, chronic, left lower quadrant    Acute torn meniscus of knee    Adenomatous colon polyp 1970    carcinoma in situ   Allergic rhinitis    Amaurosis fugax 08/07/2012   Anxiety    Ascending aortic aneurysm ([place]) 12/07/2015   59mm by echo 07/2019   Benign essential tremor syndrome    Bicuspid aortic valve    no AS on 07/2019   Carotid stenosis    1039 bilateral by dopplers 03/2017.    colon ca dx'd 1970   surg only   DDD (degenerative disc disease)    Diverticulitis    Diverticulosis    DM (diabetes mellitus) ([place])    Duodenitis    peptic, with gastric heterotopia   Endometriosis    s/p hysterectomy   Fundic gland polyposis of stomach    GERD (gastroesophageal reflux disease)    Heart murmur    Hiatal hernia 02/03/05   History of cerebral aneurysm repair    s/p coiling   History  of hemorrhoids    with bleeding   History of shingles    HLD (hyperlipidemia)    HTN (hypertension)    Hypercholesteremia    IBS (irritable bowel syndrome)    Iron deficiency    Migraine    MVP (mitral valve prolapse)    Osteopenia    Peripheral neuropathy    Toe fracture, right    second toe   UTI (lower urinary tract infection)    Varicose vein    Past Surgical History:  Procedure Laterality Date   ABDOMINAL HYSTERECTOMY     APPENDECTOMY     CARPAL TUNNEL RELEASE  08/26/07   CATARACT EXTRACTION     CEREBRAL ANEURYSM REPAIR     COLON RESECTION     COLONOSCOPY  06/07/08   divertiulosis, internal hemorrhoids   COLONOSCOPY W/ BIOPSIES AND POLYPECTOMY  02/03/05   diverticulosis, 4 mm sessile polyps, internal and external hemorrhoids   CORONARY ANGIOPLASTY WITH STENT PLACEMENT     ESOPHAGOGASTRODUODENOSCOPY  02/03/05   hiatal hernia, 6  benign gastric polyps   FLEXIBLE SIGMOIDOSCOPY     HAND SURGERY Right    INTRAOCULAR LENS INSERTION     right hand decompressive fasciotomy  08/26/07   , dorsal and volar   TONSILLECTOMY       Current Meds  Medication Sig   acetaminophen (TYLENOL) 500 MG tablet Take 500 mg by mouth every 6 (six) hours as needed for mild pain, moderate pain, fever or headache.   ALPRAZolam (XANAX) 0.5 MG tablet Take 0.5 mg by mouth at bedtime.   Cholecalciferol (VITAMIN D-3) 1000 UNITS CAPS Take 3 capsules by mouth at bedtime.    hydrALAZINE (APRESOLINE) 25 MG tablet Take 1 tablet (25 mg total) by mouth 3 (three) times daily.   hydrocortisone (ANUSOL-HC) 2.5 % rectal cream Apply to perianal area 3 times daily.   insulin glargine (LANTUS) 100 UNIT/ML Solostar Pen Inject 26 Units into the skin daily with breakfast.    Multiple Vitamin (MULTIVITAMIN) tablet Take 1 tablet by mouth daily.     pantoprazole (PROTONIX) 40 MG tablet TAKE 1 TABLET BY MOUTH EVERY DAY BEFORE BREAKFAST   spironolactone (ALDACTONE) 25 MG tablet Take 0.5 tablets (12.5 mg total) by mouth daily. Please schedule yearly appointment for future refills. Thank you   trandolapril (MAVIK) 4 MG tablet TAKE 1 TABLET BY MOUTH TWICE A DAY     Allergies:   Actos [pioglitazone], Avandia [rosiglitazone], Fosamax [alendronate sodium], Morphine, Ultram [tramadol], Glimepiride, Januvia [sitagliptin], Lipitor [atorvastatin], Metformin and related, Prandin [repaglinide], Tradjenta [linagliptin], Zoloft [sertraline hcl], Aspirin, Bentyl [dicyclomine hcl], Ciprofloxacin, Hctz [hydrochlorothiazide], Keflex [cephalexin], Morphine and related, Norvasc [amlodipine besylate], Oxycodone, Sulfa antibiotics, and Penicillins   Social History   Tobacco Use   Smoking status: Never   Smokeless tobacco: Never  Vaping Use   Vaping Use: Never used  Substance Use Topics   Alcohol use: No   Drug use: No     Family Hx: The patient's family history includes Colon cancer  in an other family member; Diabetes in her brother, brother, paternal grandmother, and another family member; Heart disease in her brother, brother, and father; Hypertension in her brother; Kidney disease in her father; Microcephaly in her brother; Ovarian cancer in her mother.  ROS:   Please see the history of present illness.    2D echo 12/2017 All other systems reviewed and are negative.   Labs/Other Tests and Data Reviewed:    Recent Labs: No results found for requested labs within last  8760 hours.   Recent Lipid Panel Lab Results  Component Value Date/Time   CHOL 127 01/16/2018 02:33 AM   TRIG 72 01/16/2018 02:33 AM   HDL 29 (L) 01/16/2018 02:33 AM   CHOLHDL 4.4 01/16/2018 02:33 AM   LDLCALC 84 01/16/2018 02:33 AM    Wt Readings from Last 3 Encounters:  08/04/20 147 lb 6.4 oz (66.9 kg)  06/27/20 143 lb (64.9 kg)  05/04/20 147 lb (66.7 kg)     Objective:    Vital Signs:  BP 134/60   Pulse 76   Ht 5\' 4"  (1.626 m)   Wt 147 lb 6.4 oz (66.9 kg)   BMI 25.30 kg/m    GEN: Well nourished, well developed in no acute distress HEENT: Normal NECK: No JVD; No carotid bruits LYMPHATICS: No lymphadenopathy CARDIAC:RRR, no murmurs, rubs, gallops RESPIRATORY:  Clear to auscultation without rales, wheezing or rhonchi  ABDOMEN: Soft, non-tender, non-distended MUSCULOSKELETAL:  No edema; No deformity  SKIN: Warm and dry NEUROLOGIC:  Alert and oriented x 3 PSYCHIATRIC:  Normal affect  EKG was performed in the office today and demonstrates NSR with no ST changes   ASSESSMENT & PLAN:    1.  Ascending aortic aneurysm  -stable by Chest CTA 07/2019 at 4.4cm  -her BP is adequately controlled  -she has been followed by Dr. Cyndia Bent  -will a Chest CTA to reassess ascending aorta  2.  HTN  -BP is adequately controlled on exam today -Continue prescription drug management with Hydralazine 25mg  TID, Mavik 4mg  BID and spiro 12.5mg  daily>>refilled for 1 year -I have personally reviewed and  interpreted outside labs performed by patient's PCP which showed SCr 0.92, K+ 5.1   3.  Palpitations/PVCs -she has a hx of TIA in 2018 and event monitor 07/2017 showed PVCs. -she denies any palpitations  4.  Mild carotid artery stenosis  -Her last dopplers showed 1-39% bilateral stenosis 12/2017.  -repeat carotid dopplers -statin intolerant  5.  Insulin dependent DM  -this is followed by her PCP -continue on Insulin  6.  Chest pain -this is chronic problem>>ordered NST last year but she never had it done -nuclear stress test 2017 for CP and DOE showed no ischemia -not sure some of it is not related to stress and anxiety -EKG remains nonischemic -she has not had any further CP so will continue to monitor for now and NST planned only for recurrent CP  7.  DOE -this is chronic and 2D echo a year ago for worsening DOE showed normal LVF, G1DD and no PHTN and no hemodynamically significant vavular heart disease -her breathing has improved and only occurs when she is in a hurry -I have personally reviewed and interpreted outside labs performed by patient's PCP which showed Hbg 11.3 in Feb 2022   Medication Adjustments/Labs and Tests Ordered: Current medicines are reviewed at length with the patient today.  Concerns regarding medicines are outlined above.  Tests Ordered: Orders Placed This Encounter  Procedures   EKG 12-Lead    Medication Changes: No orders of the defined types were placed in this encounter.   Disposition:  Follow up in 1 year(s)  Signed, Fransico Him, MD  08/04/2020 10:31 AM    [facility]

## 2020-08-04 ENCOUNTER — Other Ambulatory Visit: Payer: Self-pay

## 2020-08-04 ENCOUNTER — Ambulatory Visit: Payer: Medicare PPO | Admitting: Cardiology

## 2020-08-04 ENCOUNTER — Encounter: Payer: Self-pay | Admitting: Cardiology

## 2020-08-04 VITALS — BP 134/60 | HR 76 | Ht 64.0 in | Wt 147.4 lb

## 2020-08-04 DIAGNOSIS — I712 Thoracic aortic aneurysm, without rupture, unspecified: Secondary | ICD-10-CM

## 2020-08-04 DIAGNOSIS — R072 Precordial pain: Secondary | ICD-10-CM

## 2020-08-04 DIAGNOSIS — I6523 Occlusion and stenosis of bilateral carotid arteries: Secondary | ICD-10-CM

## 2020-08-04 DIAGNOSIS — R002 Palpitations: Secondary | ICD-10-CM

## 2020-08-04 DIAGNOSIS — R0609 Other forms of dyspnea: Secondary | ICD-10-CM

## 2020-08-04 DIAGNOSIS — R809 Proteinuria, unspecified: Secondary | ICD-10-CM

## 2020-08-04 DIAGNOSIS — I10 Essential (primary) hypertension: Secondary | ICD-10-CM | POA: Diagnosis not present

## 2020-08-04 DIAGNOSIS — E1029 Type 1 diabetes mellitus with other diabetic kidney complication: Secondary | ICD-10-CM | POA: Diagnosis not present

## 2020-08-04 DIAGNOSIS — R0600 Dyspnea, unspecified: Secondary | ICD-10-CM | POA: Diagnosis not present

## 2020-08-04 MED ORDER — TRANDOLAPRIL 4 MG PO TABS
4.0000 mg | ORAL_TABLET | Freq: Two times a day (BID) | ORAL | 3 refills | Status: DC
Start: 2020-08-04 — End: 2021-08-16

## 2020-08-04 MED ORDER — HYDRALAZINE HCL 25 MG PO TABS: 25.0000 mg | ORAL_TABLET | Freq: Three times a day (TID) | ORAL | 3 refills | Status: DC

## 2020-08-04 MED ORDER — SPIRONOLACTONE 25 MG PO TABS: 12.5000 mg | ORAL_TABLET | Freq: Every day | ORAL | 3 refills | Status: DC

## 2020-08-04 NOTE — Patient Instructions (Addendum)
Medication Instructions:  Your physician recommends that you continue on your current medications as directed. Please refer to the Current Medication list given to you today.  *If you need a refill on your cardiac medications before your next appointment, please call your pharmacy*   Lab Work: None  If you have labs (blood work) drawn today and your tests are completely normal, you will receive your results only by: [place] (if you have MyChart) OR A paper copy in the mail If you have any lab test that is abnormal or we need to change your treatment, we will call you to review the results.   Testing/Procedures: Your physician has requested that you have a carotid duplex (already ordered). This test is an ultrasound of the carotid arteries in your neck. It looks at blood flow through these arteries that supply the brain with blood. Allow one hour for this exam. There are no restrictions or special instructions.  Follow-Up: At [facility], you and your health needs are our priority.  As part of our continuing mission to provide you with exceptional heart care, we have created designated Provider Care Teams.  These Care Teams include your primary Cardiologist (physician) and Advanced Practice Providers (APPs -  Physician Assistants and Nurse Practitioners) who all work together to provide you with the care you need, when you need it.  We recommend signing up for the patient portal called "MyChart".  Sign up information is provided on this After Visit Summary.  MyChart is used to connect with patients for Virtual Visits (Telemedicine).  Patients are able to view lab/test results, encounter notes, upcoming appointments, etc.  Non-urgent messages can be sent to your provider as well.   To learn more about what you can do with MyChart, go to [email or web address].    Your next appointment:   1 year(s)  The format for your next appointment:   In Person  Provider:   Fransico Him,  MD   Other Instructions None

## 2020-08-06 ENCOUNTER — Encounter (HOSPITAL_COMMUNITY): Payer: Medicare PPO

## 2020-09-02 ENCOUNTER — Ambulatory Visit (HOSPITAL_COMMUNITY)
Admission: RE | Admit: 2020-09-02 | Payer: Medicare PPO | Source: Ambulatory Visit | Attending: Cardiology | Admitting: Cardiology

## 2020-09-14 ENCOUNTER — Other Ambulatory Visit: Payer: Medicare PPO

## 2020-09-16 ENCOUNTER — Other Ambulatory Visit: Payer: Medicare PPO | Admitting: *Deleted

## 2020-09-16 ENCOUNTER — Other Ambulatory Visit: Payer: Self-pay | Admitting: *Deleted

## 2020-09-16 ENCOUNTER — Other Ambulatory Visit: Payer: Self-pay

## 2020-09-16 DIAGNOSIS — I712 Thoracic aortic aneurysm, without rupture, unspecified: Secondary | ICD-10-CM

## 2020-09-16 DIAGNOSIS — Z01812 Encounter for preprocedural laboratory examination: Secondary | ICD-10-CM | POA: Diagnosis not present

## 2020-09-17 LAB — BASIC METABOLIC PANEL
BUN/Creatinine Ratio: 34 — ABNORMAL HIGH (ref 12–28)
BUN: 28 mg/dL — ABNORMAL HIGH (ref 8–27)
CO2: 24 mmol/L (ref 20–29)
Calcium: 9.6 mg/dL (ref 8.7–10.3)
Chloride: 103 mmol/L (ref 96–106)
Creatinine, Ser: 0.82 mg/dL (ref 0.57–1.00)
Glucose: 126 mg/dL — ABNORMAL HIGH (ref 65–99)
Potassium: 4.7 mmol/L (ref 3.5–5.2)
Sodium: 142 mmol/L (ref 134–144)
eGFR: 70 mL/min/{1.73_m2} (ref >59)

## 2020-09-18 ENCOUNTER — Other Ambulatory Visit: Payer: Self-pay

## 2020-09-18 ENCOUNTER — Ambulatory Visit (INDEPENDENT_AMBULATORY_CARE_PROVIDER_SITE_OTHER)
Admission: RE | Admit: 2020-09-18 | Discharge: 2020-09-18 | Disposition: A | Discharge status: Home / Self Care | Payer: Medicare PPO | Source: Ambulatory Visit | Attending: Cardiology | Admitting: Cardiology

## 2020-09-18 DIAGNOSIS — I712 Thoracic aortic aneurysm, without rupture, unspecified: Secondary | ICD-10-CM

## 2020-09-18 DIAGNOSIS — E041 Nontoxic single thyroid nodule: Secondary | ICD-10-CM | POA: Diagnosis not present

## 2020-09-18 MED ORDER — IOHEXOL 350 MG/ML SOLN
100.0000 mL | Freq: Once | INTRAVENOUS | Status: AC | PRN
  Administered 2020-09-18: 100 mL via INTRAVENOUS

## 2020-09-20 ENCOUNTER — Encounter: Payer: Self-pay | Admitting: Cardiology

## 2020-10-02 DIAGNOSIS — R600 Localized edema: Secondary | ICD-10-CM | POA: Diagnosis not present

## 2020-10-02 DIAGNOSIS — R32 Unspecified urinary incontinence: Secondary | ICD-10-CM | POA: Diagnosis not present

## 2020-10-02 DIAGNOSIS — M1990 Unspecified osteoarthritis, unspecified site: Secondary | ICD-10-CM | POA: Diagnosis not present

## 2020-10-02 DIAGNOSIS — I951 Orthostatic hypotension: Secondary | ICD-10-CM | POA: Diagnosis not present

## 2020-10-02 DIAGNOSIS — I10 Essential (primary) hypertension: Secondary | ICD-10-CM | POA: Diagnosis not present

## 2020-10-02 DIAGNOSIS — Z794 Long term (current) use of insulin: Secondary | ICD-10-CM | POA: Diagnosis not present

## 2020-10-02 DIAGNOSIS — K219 Gastro-esophageal reflux disease without esophagitis: Secondary | ICD-10-CM | POA: Diagnosis not present

## 2020-10-02 DIAGNOSIS — E1142 Type 2 diabetes mellitus with diabetic polyneuropathy: Secondary | ICD-10-CM | POA: Diagnosis not present

## 2020-10-02 DIAGNOSIS — G8929 Other chronic pain: Secondary | ICD-10-CM | POA: Diagnosis not present

## 2020-10-06 DIAGNOSIS — M8580 Other specified disorders of bone density and structure, unspecified site: Secondary | ICD-10-CM | POA: Diagnosis not present

## 2020-10-06 DIAGNOSIS — Z794 Long term (current) use of insulin: Secondary | ICD-10-CM | POA: Diagnosis not present

## 2020-10-06 DIAGNOSIS — E1142 Type 2 diabetes mellitus with diabetic polyneuropathy: Secondary | ICD-10-CM | POA: Diagnosis not present

## 2020-10-06 DIAGNOSIS — E1165 Type 2 diabetes mellitus with hyperglycemia: Secondary | ICD-10-CM | POA: Diagnosis not present

## 2020-10-06 DIAGNOSIS — E11319 Type 2 diabetes mellitus with unspecified diabetic retinopathy without macular edema: Secondary | ICD-10-CM | POA: Diagnosis not present

## 2020-10-26 ENCOUNTER — Institutional Professional Consult (permissible substitution): Payer: Medicare PPO | Admitting: Neurology

## 2020-10-28 ENCOUNTER — Encounter: Payer: Self-pay | Admitting: Neurology

## 2020-11-03 ENCOUNTER — Other Ambulatory Visit: Payer: Self-pay

## 2020-11-03 ENCOUNTER — Encounter: Payer: Self-pay | Admitting: Podiatry

## 2020-11-03 ENCOUNTER — Ambulatory Visit: Payer: Medicare PPO | Admitting: Podiatry

## 2020-11-03 ENCOUNTER — Institutional Professional Consult (permissible substitution): Payer: Medicare PPO | Admitting: Neurology

## 2020-11-03 DIAGNOSIS — M79674 Pain in right toe(s): Secondary | ICD-10-CM | POA: Diagnosis not present

## 2020-11-03 DIAGNOSIS — M79675 Pain in left toe(s): Secondary | ICD-10-CM

## 2020-11-03 DIAGNOSIS — L84 Corns and callosities: Secondary | ICD-10-CM | POA: Diagnosis not present

## 2020-11-03 DIAGNOSIS — E1142 Type 2 diabetes mellitus with diabetic polyneuropathy: Secondary | ICD-10-CM

## 2020-11-03 DIAGNOSIS — B351 Tinea unguium: Secondary | ICD-10-CM

## 2020-11-08 NOTE — Progress Notes (Signed)
Subjective:  Patient ID: [name], female    DOB: 1932-07-16,  MRN: [id number]  87 y.o. female presents at risk foot care with history of peripheral neuropathy and callus(es) right foot and painful thick toenails that are difficult to trim. Painful toenails interfere with ambulation. Aggravating factors include wearing enclosed shoe gear. Pain is relieved with periodic professional debridement. Painful calluses are aggravated when weightbearing with and without shoegear. Pain is relieved with periodic professional debridement.  She was brought to appointment by her daughter who is waiting for her in the lobby with patient's husband who also had an appointment with [place] on today.  Patient states blood glucose was 92 mg/dl today.    She has h/o neuropathy and will be following up with Dr. Brett Fairy in Neurology in December.  PCP is Antony Contras, MD , and last visit was February, 2022.  Allergies  Allergen Reactions   Actos [Pioglitazone]     Chronic  Pedal edema:contraindication   Avandia [Rosiglitazone]     Chronic pedal edema:contraindication   Fosamax [Alendronate Sodium]     unknown   Morphine Other (See Comments)   Ultram [Tramadol] Shortness Of Breath and Palpitations   Glimepiride     hypersensitivity   Januvia [Sitagliptin]     hypersensitivity   Lipitor [Atorvastatin]     Causes muscle pain and swelling   Metformin And Related     gastritis   Prandin [Repaglinide]     Abdominal pain: side effects   Tradjenta [Linagliptin]     GI problems, hypersensitivity   Zoloft [Sertraline Hcl]     Jittery or zombie like   Aspirin Other (See Comments)    Bleeding ulcers    Bentyl [Dicyclomine Hcl]     Came close to passing out. weak   Ciprofloxacin Nausea And Vomiting    Severe stomach upset   Hctz [Hydrochlorothiazide] Other (See Comments)    URINARY ISSUES   Keflex [Cephalexin]    Morphine And Related Other (See Comments)    Body Spasms   Norvasc [Amlodipine Besylate]      Weak and dizzy.    Oxycodone    Sulfa Antibiotics Hives   Penicillins Swelling and Rash    Amoxicillin causes stomach upset.    Review of Systems: Negative except as noted in the HPI.   Objective:  Vascular Examination: Capillary refill time to digits immediate b/l. Palpable pedal pulses b/l LE. Pedal hair sparse. Lower extremity skin temperature gradient within normal limits. No pain with calf compression b/l. Trace edema noted b/l lower extremities. Varicosities present b/l.  Neurological Examination: Pt has subjective symptoms of neuropathy. Protective sensation decreased with 10 gram monofilament b/l.  Dermatological Examination: Skin warm and supple b/l lower extremities. No open wounds b/l lower extremities. No interdigital macerations b/l lower extremities. Toenails 1-5 b/l elongated, discolored, dystrophic, thickened, crumbly with subungual debris and tenderness to dorsal palpation. Hyperkeratotic lesion(s) right foot.  No erythema, no edema, no drainage, no fluctuance.  Musculoskeletal Examination: Normal muscle strength 5/5 to all lower extremity muscle groups bilaterally. Pes planus deformity noted b/l lower extremities.  Radiographs: None Assessment:   1. Pain due to onychomycosis of toenails of both feet   2. Callus   3. Diabetic peripheral neuropathy associated with type 2 diabetes mellitus ([place])    Plan:  -Examined patient. -Continue diabetic foot care principles: inspect feet daily, monitor glucose as recommended by PCP and/or Endocrinologist, and follow prescribed diet per PCP, Endocrinologist and/or dietician. -Patient to continue soft, supportive shoe  gear daily. -Toenails 1-5 b/l were debrided in length and girth with sterile nail nippers and dremel without iatrogenic bleeding.  -Callus(es) right foot pared utilizing sterile scalpel blade without complication or incident. Total number debrided =1. -Patient to report any pedal injuries to medical  professional immediately. -Patient/POA to call should there be question/concern in the interim.  Return in about 3 months (around 02/03/2021).  Marzetta Board, DPM

## 2020-11-20 DIAGNOSIS — M791 Myalgia, unspecified site: Secondary | ICD-10-CM | POA: Diagnosis not present

## 2020-11-20 DIAGNOSIS — Z794 Long term (current) use of insulin: Secondary | ICD-10-CM | POA: Diagnosis not present

## 2020-11-20 DIAGNOSIS — E1142 Type 2 diabetes mellitus with diabetic polyneuropathy: Secondary | ICD-10-CM | POA: Diagnosis not present

## 2020-11-20 DIAGNOSIS — I10 Essential (primary) hypertension: Secondary | ICD-10-CM | POA: Diagnosis not present

## 2020-11-20 DIAGNOSIS — F439 Reaction to severe stress, unspecified: Secondary | ICD-10-CM | POA: Diagnosis not present

## 2021-01-20 DIAGNOSIS — D509 Iron deficiency anemia, unspecified: Secondary | ICD-10-CM | POA: Diagnosis not present

## 2021-01-20 DIAGNOSIS — E782 Mixed hyperlipidemia: Secondary | ICD-10-CM | POA: Diagnosis not present

## 2021-01-20 DIAGNOSIS — I10 Essential (primary) hypertension: Secondary | ICD-10-CM | POA: Diagnosis not present

## 2021-01-20 DIAGNOSIS — Z0000 Encounter for general adult medical examination without abnormal findings: Secondary | ICD-10-CM | POA: Diagnosis not present

## 2021-01-20 DIAGNOSIS — E559 Vitamin D deficiency, unspecified: Secondary | ICD-10-CM | POA: Diagnosis not present

## 2021-01-20 DIAGNOSIS — G629 Polyneuropathy, unspecified: Secondary | ICD-10-CM | POA: Diagnosis not present

## 2021-01-20 DIAGNOSIS — F419 Anxiety disorder, unspecified: Secondary | ICD-10-CM | POA: Diagnosis not present

## 2021-01-20 DIAGNOSIS — K219 Gastro-esophageal reflux disease without esophagitis: Secondary | ICD-10-CM | POA: Diagnosis not present

## 2021-01-20 DIAGNOSIS — N1831 Chronic kidney disease, stage 3a: Secondary | ICD-10-CM | POA: Diagnosis not present

## 2021-01-21 ENCOUNTER — Ambulatory Visit: Payer: Medicare PPO | Admitting: Neurology

## 2021-01-21 ENCOUNTER — Encounter: Payer: Self-pay | Admitting: Neurology

## 2021-01-21 VITALS — BP 152/74 | HR 99 | Ht 64.0 in | Wt 151.0 lb

## 2021-01-21 DIAGNOSIS — Z794 Long term (current) use of insulin: Secondary | ICD-10-CM | POA: Insufficient documentation

## 2021-01-21 DIAGNOSIS — M4722 Other spondylosis with radiculopathy, cervical region: Secondary | ICD-10-CM | POA: Insufficient documentation

## 2021-01-21 DIAGNOSIS — M25471 Effusion, right ankle: Secondary | ICD-10-CM

## 2021-01-21 DIAGNOSIS — M25472 Effusion, left ankle: Secondary | ICD-10-CM | POA: Insufficient documentation

## 2021-01-21 DIAGNOSIS — E1142 Type 2 diabetes mellitus with diabetic polyneuropathy: Secondary | ICD-10-CM | POA: Diagnosis not present

## 2021-01-21 DIAGNOSIS — S43422A Sprain of left rotator cuff capsule, initial encounter: Secondary | ICD-10-CM

## 2021-01-21 NOTE — Patient Instructions (Signed)
Diabetic Neuropathy Diabetic neuropathy refers to nerve damage that is caused by diabetes. Over time, people with diabetes can develop nerve damage throughout the body. There are several types of diabetic neuropathy: Peripheral neuropathy. This is the most common type of diabetic neuropathy. It damages the nerves that carry signals between the spinal cord and other parts of the body (peripheral nerves). This usually affects nerves in the feet, legs, hands, and arms. Autonomic neuropathy. This type causes damage to nerves that control involuntary functions (autonomic nerves). Involuntary functions are functions of the body that you do not control. They include heartbeat, body temperature, blood pressure, urination, digestion, sweating, sexual function, or response to changes in blood glucose. Focal neuropathy. This type of nerve damage affects one area of the body, such as an arm, a leg, or the face. The injury may involve one nerve or a small group of nerves. Focal neuropathy can be painful and unpredictable. It occurs most often in older adults with diabetes. This often develops suddenly, but usually improves over time and does not cause long-term problems. Proximal neuropathy. This type of nerve damage affects the nerves of the thighs, hips, buttocks, or legs. It causes severe pain, weakness, and muscle death (atrophy), usually in the thigh muscles. It is more common among older men and people who have type 2 diabetes. The length of recovery time may vary. What are the causes? Peripheral, autonomic, and focal neuropathies are caused by diabetes that is not well controlled with treatment. The cause of proximal neuropathy is not known, but it may be caused by inflammation related to uncontrolled blood glucose levels. What are the signs or symptoms? Peripheral neuropathy Peripheral neuropathy develops slowly over time. When the nerves of the feet and legs no longer work, you may experience: Burning,  stabbing, or aching pain in the legs or feet. Pain or cramping in the legs or feet. Loss of feeling (numbness) and inability to feel pressure or pain in the feet. This can lead to: Thick calluses or sores on areas of constant pressure. Ulcers. Reduced ability to feel temperature changes. Foot deformities. Muscle weakness. Loss of balance or coordination. Autonomic neuropathy The symptoms of autonomic neuropathy vary depending on which nerves are affected. Symptoms may include: Problems with digestion, such as: Nausea or vomiting. Poor appetite. Bloating. Diarrhea or constipation. Trouble swallowing. Losing weight without trying to. Problems with the heart, blood, and lungs, such as: Dizziness, especially when standing up. Fainting. Shortness of breath. Irregular heartbeat. Bladder problems, such as: Trouble starting or stopping urination. Leaking urine. Trouble emptying the bladder. Urinary tract infections (UTIs). Problems with other body functions, such as: Sweat. You may sweat too much or too little. Temperature. You might get hot easily. Or, you might feel cold more than usual. Sexual function. Men may not be able to get or maintain an erection. Women may have vaginal dryness and difficulty with arousal. Focal neuropathy Symptoms affect only one area of the body. Common symptoms include: Numbness. Tingling. Burning pain. Prickling feeling. Very sensitive skin. Weakness. Inability to move (paralysis). Muscle twitching. Muscles getting smaller (wasting). Poor coordination. Double or blurred vision. Proximal neuropathy Sudden, severe pain in the hip, thigh, or buttocks. Pain may spread from the back into the legs (sciatica). Pain and numbness in the arms and legs. Tingling. Loss of bladder control or bowel control. Weakness and wasting of thigh muscles. Difficulty getting up from a seated position. Abdominal swelling. Unexplained weight loss. How is this  diagnosed? Diagnosis varies depending on the type   of neuropathy your health care provider suspects. Peripheral neuropathy Your health care provider will do a neurologic exam. This exam checks your reflexes, how you move, and what you can feel. You may have other tests, such as: Blood tests. Tests of the fluid that surrounds the spinal cord (lumbar puncture). CT scan. MRI. Checking the nerves that control muscles (electromyogram, or EMG). Checking how quickly signals pass through your nerves (nerve conduction study). Checking a small piece of a nerve using a microscope (biopsy). Autonomic neuropathy You may have tests, such as: Tests to measure your blood pressure and heart rate. You may be secured to an exam table that moves you from a lying position to an upright position (table tilt test). Breathing tests to check your lungs. Tests to check how food moves through the digestive system (gastric emptying tests). Blood, sweat, or urine tests. Ultrasound of your bladder. Spinal fluid tests. Focal neuropathy This condition may be diagnosed with: A neurologic exam. CT scan. MRI. EMG. Nerve conduction study. Proximal neuropathy There is no test to diagnose this type of neuropathy. You may have tests to rule out other possible causes of this type of neuropathy. Tests may include: X-rays of your spine and lumbar region. Lumbar puncture. MRI. How is this treated? The goal of treatment is to keep nerve damage from getting worse. Treatment may include: Following your diabetes management plan. This will help keep your blood glucose level and your A1C level within your target range. This is the most important treatment. Using prescription pain medicine. Follow these instructions at home: Diabetes management Follow your diabetes management plan as told by your health care provider. Check your blood glucose levels. Keep your blood glucose in your target range. Have your A1C level checked at  least two times a year, or as often as told. Take over the counter and prescription medicines only as told by your health care provider. This includes insulin and diabetes medicine.  Lifestyle  Do not use any products that contain nicotine or tobacco, such as cigarettes, e-cigarettes, and chewing tobacco. If you need help quitting, ask your health care provider. Be physically active every day. Include strength training and balance exercises. Follow a healthy meal plan. Work with your health care provider to manage your blood pressure. General instructions Ask your health care provider if the medicine prescribed to you requires you to avoid driving or using machinery. Check your skin and feet every day for cuts, bruises, redness, blisters, or sores. Keep all follow-up visits. This is important. Contact a health care provider if: You have burning, stabbing, or aching pain in your legs or feet. You are unable to feel pressure or pain in your feet. You develop problems with digestion, such as: Nausea. Vomiting. Bloating. Constipation. Diarrhea. Abdominal pain. You have difficulty with urination, such as: Inability to control when you urinate (incontinence). Inability to completely empty the bladder (retention). You feel as if your heart is racing (palpitations). You feel dizzy, weak, or faint when you stand up. Get help right away if: You cannot urinate. You have sudden weakness or loss of coordination. You have trouble speaking. You have pain or pressure in your chest. You have an irregular heartbeat. You have sudden inability to move a part of your body. These symptoms may represent a serious problem that is an emergency. Do not wait to see if the symptoms will go away. Get medical help right away. Call your local emergency services (911 in the [place] Do not drive yourself to   the hospital. Summary Diabetic neuropathy is nerve damage that is caused by diabetes. It can cause numbness  and pain in the arms, legs, digestive tract, heart, and other body systems. This condition is treated by keeping your blood glucose level and your A1C level within your target range. This can help prevent neuropathy from getting worse. Check your skin and feet every day for cuts, bruises, redness, blisters, or sores. Do not use any products that contain nicotine or tobacco, such as cigarettes, e-cigarettes, and chewing tobacco. This information is not intended to replace advice given to you by your health care provider. Make sure you discuss any questions you have with your health care provider. Document Revised: 05/30/2019 Document Reviewed: 05/30/2019 Elsevier Patient Education  2022 [employer] Inc.  

## 2021-01-21 NOTE — Progress Notes (Signed)
[facility]  Provider:  Dr Jawaun Celmer Referring Provider: Antony Contras, MD Primary Care Physician:  Antony Contras, MD  Chief Complaint  Patient presents with   New Patient (Initial Visit)    RM 11. Paper referral for peripheral neuropathy. Pt is here for further examination of her recent developed neuropathy  (!!!)  in her neck, shoulders and upper arms. Has been using salonpause ointment for pn. Becomes worse at night. Has trouble raising and using her L arm.     HPI:  [name] is a 87 y.o. female and seen here upon referral from Dr. Moreen Fowler for a Consultation/ Evaluation of Neuropathy (?) the patient indicated her concern is left hand numbness and pain in/on the upper left arm. Left wrist is swollen. No fracture or trauma known.  She has recently sone fall, 2- 2022.  She has KNOWN DIABETIC NEUROPATHY AND LOSS OF FINE TOUCH WITH ANKLE EDEMA> walks with a cane.  Marland Kitchen   Dewitt Rota has been followed in the past here for an unruptured cerebral aneurysm, for neuropathy ( 2014- 15) , and after that referred to podiatry, aortic aneurysm followed by Golden Hurter, MD. 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  She had suffered TIAs prior and was diagnosed with unruptured cerebral aneurysm, coiled in 04-2002, and followed by Dr Estanislado Pandy.   Her husband is 46 years and demented now, neither can drive now.   This patient reports onset of the onset of neck and left arm , hands pain over a year ago.  She was seen in September, October by Dr Buddy Duty who wrote Polyneuropathy on his  summary of the last visit.     Review of Systems: Out of a complete 14 system review, the patient complains of only the following symptoms, and all other reviewed systems are negative.  Epworth Sleepiness score: N?A  GDS 3/ 15 points.    Some dizziness. Numbness.   Arm pain, myalgia , deep pain , not feeling like bone or joint pain.  Hurst at night.   Social History   Socioeconomic History   Marital status: Married    Spouse name: Charles   Number of children: 0   Years of education: Not on file   Highest education level: Not on file  Occupational History   Occupation: retired    Fish farm manager: RETIRED  Tobacco Use   Smoking status: Never   Smokeless tobacco: Never  Vaping Use   Vaping Use: Never used  Substance and Sexual Activity   Alcohol use: No   Drug use: No   Sexual activity: Not on file  Other Topics Concern   Not on file  Social History Narrative   Lives with husband    Right handed   Caffeine: 2 c of coffee a day   Social Determinants of Health   Financial Resource Strain: Not on file  Food Insecurity: Not on file  Transportation Needs: Not on file  Physical Activity: Not on file  Stress: Not on file  Social Connections: Not on file  Intimate Partner Violence: Not on file    Family History  Problem Relation Age of Onset   Ovarian cancer Mother    Heart disease Father    Kidney disease Father    Diabetes Paternal Grandmother    Diabetes Brother  Hypertension Brother    Heart disease Brother    Diabetes Brother    Heart disease Brother    Microcephaly Brother    Diabetes Other    Colon cancer Other     Past Medical History:  Diagnosis Date   Abdominal pain, chronic, left lower quadrant    Acute torn meniscus of knee    Adenomatous colon polyp 1970   carcinoma in situ   Allergic rhinitis    Amaurosis fugax 08/07/2012   Anxiety    Ascending aortic aneurysm 12/07/2015   53m by chest CTA 08/2020   Benign essential tremor syndrome    Bicuspid aortic valve    no AS on 07/2019   Carotid stenosis    1039 bilateral by dopplers 03/2017.    colon ca dx'd 1970   surg only   DDD (degenerative disc disease)    Diverticulitis    Diverticulosis    [place]     Duodenitis    peptic, with gastric heterotopia   Endometriosis    s/p hysterectomy   Fundic gland polyposis of stomach    GERD (gastroesophageal reflux disease)    Heart murmur    Hiatal hernia 02/03/2005   History of cerebral aneurysm repair    s/p coiling   History of hemorrhoids    with bleeding   History of shingles    HLD (hyperlipidemia)    HTN (hypertension)    Hypercholesteremia    IBS (irritable bowel syndrome)    Iron deficiency    Migraine    MVP (mitral valve prolapse)    Osteopenia    Peripheral neuropathy    Toe fracture, right    second toe   UTI (lower urinary tract infection)    Varicose vein     Past Surgical History:  Procedure Laterality Date   ABDOMINAL HYSTERECTOMY     APPENDECTOMY     CARPAL TUNNEL RELEASE  08/26/07   CATARACT EXTRACTION     CEREBRAL ANEURYSM REPAIR     COLON RESECTION     COLONOSCOPY  06/07/08   divertiulosis, internal hemorrhoids   COLONOSCOPY W/ BIOPSIES AND POLYPECTOMY  02/03/05   diverticulosis, 4 mm sessile polyps, internal and external hemorrhoids   CORONARY ANGIOPLASTY WITH STENT PLACEMENT     ESOPHAGOGASTRODUODENOSCOPY  02/03/05   hiatal hernia, 6 benign gastric polyps   FLEXIBLE SIGMOIDOSCOPY     HAND SURGERY Right    INTRAOCULAR LENS INSERTION     right hand decompressive fasciotomy  08/26/07   , dorsal and volar   TONSILLECTOMY      Current Outpatient Medications  Medication Sig Dispense Refill   acetaminophen (TYLENOL) 500 MG tablet Take 500 mg by mouth every 6 (six) hours as needed for mild pain, moderate pain, fever or headache.     Cholecalciferol (VITAMIN D-3) 1000 UNITS CAPS Take 3 capsules by mouth at bedtime.      hydrALAZINE (APRESOLINE) 10 MG tablet Take 10 mg by mouth 3 (three) times daily.     hydrocortisone (ANUSOL-HC) 2.5 % rectal cream Apply to perianal area 3 times daily. 30 g 2   insulin glargine (LANTUS) 100 UNIT/ML Solostar Pen Inject 20 Units into the skin daily with breakfast.     Multiple  Vitamin (MULTIVITAMIN) tablet Take 1 tablet by mouth daily.       pantoprazole (PROTONIX) 40 MG tablet TAKE 1 TABLET BY MOUTH EVERY DAY BEFORE BREAKFAST 90 tablet 0   spironolactone (ALDACTONE) 25 MG tablet Take 0.5  tablets (12.5 mg total) by mouth daily. 45 tablet 3   trandolapril (MAVIK) 4 MG tablet Take 1 tablet (4 mg total) by mouth 2 (two) times daily. 180 tablet 3   No current facility-administered medications for this visit.    Allergies as of 01/21/2021 - Review Complete 01/21/2021  Allergen Reaction Noted   Actos [pioglitazone]  08/07/2012   Avandia [rosiglitazone]  08/07/2012   Fosamax [alendronate sodium]  08/07/2012   Morphine Other (See Comments)    Ultram [tramadol] Shortness Of Breath and Palpitations 06/10/2013   Glimepiride  08/07/2012   Januvia [sitagliptin]  08/07/2012   Lipitor [atorvastatin]  08/07/2012   Metformin and related  08/07/2012   Prandin [repaglinide]  08/07/2012   Tradjenta [linagliptin]  08/07/2012   Zoloft [sertraline hcl]  08/07/2012   Aspirin Other (See Comments)    Bentyl [dicyclomine hcl]  08/07/2012   Ciprofloxacin Nausea And Vomiting 05/21/2013   Hctz [hydrochlorothiazide] Other (See Comments) 10/26/2015   Keflex [cephalexin]  12/12/2013   Morphine and related Other (See Comments) 06/10/2013   Norvasc [amlodipine besylate]  10/14/2015   Oxycodone     Sulfa antibiotics Hives 05/04/2020   Penicillins Swelling and Rash     Vitals: BP (!) 152/74    Pulse 99    Ht _0  (1.626 m)    Wt 151 lb (68.5 kg)    BMI 25.92 kg/m  Last Weight:  Wt Readings from Last 1 Encounters:  01/21/21 151 lb (68.5 kg)   Last Height:   Ht Readings from Last 1 Encounters:  01/21/21 _1  (1.626 m)   Last BMI: 25.92 Physical exam:  General: The patient is awake, alert and appears not in acute distress.  The patient is well groomed. Head: Normocephalic, atraumatic.  Neck is supple. No Goiter.   Neck circumference: 14 Cardiovascular:  Regular rate and  palpable peripheral pulse:  Respiratory: clear to auscultation.  Mallampati 3 , Skin:  With evidence of edema, or rash Trunk: BMI is 25  and patient  has stooped posture.   Neurologic exam : The patient is awake and alert, oriented to place and time.  Memory subjective  described as intact.  There is a normal attention span & concentration ability.  Speech is fluent with trembling voice,  dysarthria, dysphonia no aphasia.  Mood and affect are appropriate.  Cranial nerves: Pupils are equal and briskly reactive to light. Funduscopic exam deferred. Extraocular movements  in vertical and horizontal planes intact and without nystagmus. Visual fields by finger perimetry are intact. Hearing to finger rub intact.  Facial sensation intact to fine touch. Facial motor strength is symmetric and tongue and uvula move midline.  Motor exam:   Normal  muscle bulk and symmetric normal strength in all extremities. Grip Strength intact Proximal strength of shoulder muscles and hip flexors was reduced.  Sensory:  Fine touch and vibration were tested . Numbness at both ankle severly decreased, going on 20 years- ankle edema, severe, filament and vibration reduced. Proprioception was tested in the upper extremities only and was  normal.  Coordination: Rapid alternating movements in the fingers/hands were normal.  Finger-to-nose maneuver was tested and showed no evidence of ataxia, dysmetria or tremor.  Gait and station: Patient walked with cane.   Deep tendon reflexes: in the  upper and lower extremities are symmetric and attenuated. Babinski maneuver response is  downgoing.   Assessment: Total time for face to face interview and examination, for review of  images and laboratory testing, neurophysiology testing  and pre-existing records, including out-of -network , was 45 minutes. Assessment is as follows here:  1) I think Mrs. Charlson presents more with a tension component neck pain, but also a chronic  burning deep muscle pain between the biceps and triceps tendons and there is a possible impingement at the rotator cuff.    2)Her lower extremity neuropathy is longstanding and long known and diabetes only came later in life and would explain her low reflex level , and the swelling at the ankles which has been significant ,is her main sensory barrier.  3) walks with a cane, had one fall over 8 months ago.   Plan:  Treatment plan and additional workup planned after today includes:   1) NCV and EMG on the upper extremities with paraspinal testing. Dr Jaynee Eagles requested.  2) neuropathy panel  3) EMLA cream - / salon PAS and prn  tylenol.    Larey Seat, MD  RV in 4 months/

## 2021-01-21 NOTE — Addendum Note (Signed)
Addended by: Larey Seat on: 01/21/2021 02:37 PM   Modules accepted: Orders

## 2021-02-08 DIAGNOSIS — D509 Iron deficiency anemia, unspecified: Secondary | ICD-10-CM | POA: Diagnosis not present

## 2021-02-08 DIAGNOSIS — E559 Vitamin D deficiency, unspecified: Secondary | ICD-10-CM | POA: Diagnosis not present

## 2021-02-08 DIAGNOSIS — E782 Mixed hyperlipidemia: Secondary | ICD-10-CM | POA: Diagnosis not present

## 2021-02-12 ENCOUNTER — Ambulatory Visit: Payer: Medicare PPO | Admitting: Podiatry

## 2021-04-15 ENCOUNTER — Encounter: Payer: Medicare PPO | Admitting: Neurology

## 2021-05-04 ENCOUNTER — Ambulatory Visit: Payer: Medicare PPO | Admitting: Podiatry

## 2021-05-04 DIAGNOSIS — B351 Tinea unguium: Secondary | ICD-10-CM

## 2021-05-04 DIAGNOSIS — M79675 Pain in left toe(s): Secondary | ICD-10-CM | POA: Diagnosis not present

## 2021-05-04 DIAGNOSIS — E119 Type 2 diabetes mellitus without complications: Secondary | ICD-10-CM

## 2021-05-04 DIAGNOSIS — M2141 Flat foot [pes planus] (acquired), right foot: Secondary | ICD-10-CM

## 2021-05-04 DIAGNOSIS — R600 Localized edema: Secondary | ICD-10-CM

## 2021-05-04 DIAGNOSIS — E1142 Type 2 diabetes mellitus with diabetic polyneuropathy: Secondary | ICD-10-CM | POA: Diagnosis not present

## 2021-05-04 DIAGNOSIS — L84 Corns and callosities: Secondary | ICD-10-CM

## 2021-05-04 DIAGNOSIS — M79674 Pain in right toe(s): Secondary | ICD-10-CM

## 2021-05-04 DIAGNOSIS — M2142 Flat foot [pes planus] (acquired), left foot: Secondary | ICD-10-CM | POA: Diagnosis not present

## 2021-05-09 NOTE — Progress Notes (Signed)
ANNUAL DIABETIC FOOT EXAM ? ?Subjective: ?[name] presents today for annual diabetic foot examination. ? ?Patient relates she was diagnosed with diabetes in the 1960's. ? ?Patient denies any h/o foot wounds. ? ?Patient states her legs are swollen today. ? ?Risk factors:  multiple drug allergies, diabetes, diabetic neuropathy, HTN, hyperlipidemia, hypercholesterolemia. ? ?Antony Contras, MD is patient's PCP. Last visit was February 08, 2021. ? ?Past Medical History:  ?Diagnosis Date  ? Abdominal pain, chronic, left lower quadrant   ? Acute torn meniscus of knee   ? Adenomatous colon polyp 1970  ? carcinoma in situ  ? Allergic rhinitis   ? Amaurosis fugax 08/07/2012  ? Anxiety   ? Ascending aortic aneurysm 12/07/2015  ? 80m by chest CTA 08/2020  ? Benign essential tremor syndrome   ? Bicuspid aortic valve   ? no AS on 07/2019  ? Carotid stenosis   ? 1039 bilateral by dopplers 03/2017.   ? colon ca dx'd 1970  ? surg only  ? DDD (degenerative disc disease)   ? Diverticulitis   ? Diverticulosis   ? DM (diabetes mellitus) ([facility]   ? Duodenitis   ? peptic, with gastric heterotopia  ? Endometriosis   ? s/p hysterectomy  ? Fundic gland polyposis of stomach   ? GERD (gastroesophageal reflux disease)   ? Heart murmur   ? Hiatal hernia 02/03/2005  ? History of cerebral aneurysm repair   ? s/p coiling  ? History of hemorrhoids   ? with bleeding  ? History of shingles   ? HLD (hyperlipidemia)   ? HTN (hypertension)   ? Hypercholesteremia   ? IBS (irritable bowel syndrome)   ? Iron deficiency   ? Migraine   ? MVP (mitral valve prolapse)   ? Osteopenia   ? Peripheral neuropathy   ? Toe fracture, right   ? second toe  ? UTI (lower urinary tract infection)   ? Varicose vein   ? ?Patient Active Problem List  ? Diagnosis Date Noted  ? Ankle edema, bilateral 01/21/2021  ? Type 2 diabetes mellitus with diabetic polyneuropathy, with long-term current use of insulin ([facility] 01/21/2021  ? Sprain of left rotator cuff capsule 01/21/2021  ?  Cervical radiculopathy due to degenerative joint disease of spine 01/21/2021  ? Left wrist pain 08/06/2019  ? Bicuspid aortic valve   ? Constipation 11/14/2017  ? Rectal bleeding 11/14/2017  ? Carotid stenosis   ? Heart palpitations 03/23/2017  ? Ascending aortic aneurysm ([facility] 12/07/2015  ? SOB (shortness of breath) 12/07/2015  ? Orthostatic hypotension 04/02/2014  ? Hematochezia 04/02/2014  ? Pain in the chest   ? Near syncope 04/01/2014  ? Insulin dependent diabetes mellitus 04/01/2014  ? Diverticulosis 04/01/2014  ? Unruptured cerebral aneurysm 01/08/2014  ? Multifactorial gait disorder 01/08/2014  ? Hereditary and idiopathic peripheral neuropathy 01/08/2014  ? Aneurysm, cerebral, nonruptured 08/07/2012  ? Amaurosis fugax 08/07/2012  ? Unspecified hereditary and idiopathic peripheral neuropathy 08/07/2012  ? Benign essential tremor syndrome   ? Abnormal x-ray of lumbar spine 08/29/2010  ? Low back pain 08/24/2010  ? ANXIETY 03/26/2010  ? Essential hypertension 03/26/2010  ? Abdominal pain, bilateral lower quadrant 03/26/2010  ? GERD 12/22/2008  ? IRON DEFICIENCY 05/22/2008  ? Irritable bowel syndrome 04/07/2008  ? COLONIC POLYPS, ADENOMATOUS, HX OF 04/07/2008  ? ?Past Surgical History:  ?Procedure Laterality Date  ? ABDOMINAL HYSTERECTOMY    ? APPENDECTOMY    ? CARPAL TUNNEL RELEASE  08/26/07  ? CATARACT EXTRACTION    ?  CEREBRAL ANEURYSM REPAIR    ? COLON RESECTION    ? COLONOSCOPY  06/07/08  ? divertiulosis, internal hemorrhoids  ? COLONOSCOPY W/ BIOPSIES AND POLYPECTOMY  02/03/05  ? diverticulosis, 4 mm sessile polyps, internal and external hemorrhoids  ? CORONARY ANGIOPLASTY WITH STENT PLACEMENT    ? ESOPHAGOGASTRODUODENOSCOPY  02/03/05  ? hiatal hernia, 6 benign gastric polyps  ? FLEXIBLE SIGMOIDOSCOPY    ? HAND SURGERY Right   ? INTRAOCULAR LENS INSERTION    ? right hand decompressive fasciotomy  08/26/07  ? , dorsal and volar  ? TONSILLECTOMY    ? ?Current Outpatient Medications on File Prior to Visit  ?Medication  Sig Dispense Refill  ? ACCU-CHEK GUIDE test strip     ? acetaminophen (TYLENOL) 500 MG tablet Take 500 mg by mouth every 6 (six) hours as needed for mild pain, moderate pain, fever or headache.    ? acetaminophen (TYLENOL) 500 MG tablet 1 tablet as needed    ? BD PEN NEEDLE NANO 2ND GEN 32G X 4 MM MISC     ? Cholecalciferol (VITAMIN D-3) 1000 UNITS CAPS Take 3 capsules by mouth at bedtime.     ? hydrALAZINE (APRESOLINE) 10 MG tablet Take 10 mg by mouth 3 (three) times daily.    ? hydrALAZINE (APRESOLINE) 25 MG tablet Take by mouth.    ? hydrocortisone (ANUSOL-HC) 2.5 % rectal cream Apply to perianal area 3 times daily. 30 g 2  ? insulin glargine (LANTUS) 100 UNIT/ML Solostar Pen Inject 20 Units into the skin daily with breakfast.    ? Insulin Pen Needle (BD PEN NEEDLE NANO 2ND GEN) 32G X 4 MM MISC See admin instructions.    ? metoprolol tartrate (LOPRESSOR) 25 MG tablet 1/2 tablet    ? Multiple Vitamin (MULTIVITAMIN) tablet Take 1 tablet by mouth daily.      ? pantoprazole (PROTONIX) 40 MG tablet TAKE 1 TABLET BY MOUTH EVERY DAY BEFORE BREAKFAST 90 tablet 0  ? predniSONE (DELTASONE) 10 MG tablet Take by mouth.    ? spironolactone (ALDACTONE) 25 MG tablet Take 0.5 tablets (12.5 mg total) by mouth daily. 45 tablet 3  ? trandolapril (MAVIK) 4 MG tablet Take 1 tablet (4 mg total) by mouth 2 (two) times daily. 180 tablet 3  ? ?No current facility-administered medications on file prior to visit.  ?  ?Allergies  ?Allergen Reactions  ? Actos [Pioglitazone]   ?  Chronic  Pedal edema:contraindication  ? Avandia [Rosiglitazone]   ?  Chronic pedal edema:contraindication  ? Fosamax [Alendronate Sodium]   ?  unknown  ? Morphine Other (See Comments)  ? Ultram [Tramadol] Shortness Of Breath and Palpitations  ? Glimepiride   ?  hypersensitivity  ? Januvia [Sitagliptin]   ?  hypersensitivity  ? Lipitor [Atorvastatin]   ?  Causes muscle pain and swelling  ? Metformin And Related   ?  gastritis  ? Prandin [Repaglinide]   ?  Abdominal  pain: side effects  ? Tradjenta [Linagliptin]   ?  GI problems, hypersensitivity  ? Zoloft [Sertraline Hcl]   ?  Jittery or zombie like  ? Aspirin Other (See Comments)  ?  Bleeding ulcers   ? Bentyl [Dicyclomine Hcl]   ?  Came close to passing out. weak  ? Ciprofloxacin Nausea And Vomiting  ?  Severe stomach upset  ? Dicyclomine   ?  Other reaction(s): Unknown  ? Hctz [Hydrochlorothiazide] Other (See Comments)  ?  URINARY ISSUES  ? Keflex [Cephalexin]   ?  Morphine And Related Other (See Comments)  ?  Body Spasms  ? Norvasc [Amlodipine Besylate]   ?  Weak and dizzy.   ? Oxycodone   ? Sulfa Antibiotics Hives  ? Sulfamethoxazole-Trimethoprim   ?  Other reaction(s): hives  ? Penicillins Swelling and Rash  ?  Amoxicillin causes stomach upset.  ? ?Social History  ? ?Occupational History  ? Occupation: retired  ?  Employer: RETIRED  ?Tobacco Use  ? Smoking status: Never  ? Smokeless tobacco: Never  ?Vaping Use  ? Vaping Use: Never used  ?Substance and Sexual Activity  ? Alcohol use: No  ? Drug use: No  ? Sexual activity: Not on file  ? ?Family History  ?Problem Relation Age of Onset  ? Ovarian cancer Mother   ? Heart disease Father   ? Kidney disease Father   ? Diabetes Paternal Grandmother   ? Diabetes Brother   ? Hypertension Brother   ? Heart disease Brother   ? Diabetes Brother   ? Heart disease Brother   ? Microcephaly Brother   ? Diabetes Other   ? Colon cancer Other   ? ? ?There is no immunization history on file for this patient.  ? ?Review of Systems: Negative except as noted in the HPI.  ? ?Objective: ?There were no vitals filed for this visit. ? ?[name] is a pleasant 88 y.o. female in NAD. AAO X 3. ? ?Vascular Examination: ?Capillary refill time to digits immediate b/l. Palpable pedal pulses b/l LE. Pedal hair sparse. Lower extremity skin temperature gradient within normal limits. No pain with calf compression b/l. +2 pedal edema noted b/l lower extremities. Varicosities present b/l. ? ?Neurological  Examination: ?Pt has subjective symptoms of neuropathy. Protective sensation decreased with 10 gram monofilament b/l. ? ?Dermatological Examination: ?Skin warm and supple b/l lower extremities. No open wou

## 2021-05-24 ENCOUNTER — Ambulatory Visit: Payer: Medicare PPO | Admitting: Neurology

## 2021-05-31 DIAGNOSIS — K219 Gastro-esophageal reflux disease without esophagitis: Secondary | ICD-10-CM | POA: Diagnosis not present

## 2021-05-31 DIAGNOSIS — D509 Iron deficiency anemia, unspecified: Secondary | ICD-10-CM | POA: Diagnosis not present

## 2021-05-31 DIAGNOSIS — E559 Vitamin D deficiency, unspecified: Secondary | ICD-10-CM | POA: Diagnosis not present

## 2021-05-31 DIAGNOSIS — E782 Mixed hyperlipidemia: Secondary | ICD-10-CM | POA: Diagnosis not present

## 2021-05-31 DIAGNOSIS — K589 Irritable bowel syndrome without diarrhea: Secondary | ICD-10-CM | POA: Diagnosis not present

## 2021-05-31 DIAGNOSIS — I10 Essential (primary) hypertension: Secondary | ICD-10-CM | POA: Diagnosis not present

## 2021-05-31 DIAGNOSIS — N1831 Chronic kidney disease, stage 3a: Secondary | ICD-10-CM | POA: Diagnosis not present

## 2021-05-31 DIAGNOSIS — G629 Polyneuropathy, unspecified: Secondary | ICD-10-CM | POA: Diagnosis not present

## 2021-05-31 DIAGNOSIS — F419 Anxiety disorder, unspecified: Secondary | ICD-10-CM | POA: Diagnosis not present

## 2021-06-30 ENCOUNTER — Encounter (HOSPITAL_COMMUNITY): Payer: Self-pay | Admitting: Emergency Medicine

## 2021-06-30 ENCOUNTER — Other Ambulatory Visit: Payer: Self-pay

## 2021-06-30 ENCOUNTER — Emergency Department (HOSPITAL_COMMUNITY): Payer: Medicare PPO

## 2021-06-30 ENCOUNTER — Inpatient Hospital Stay (HOSPITAL_COMMUNITY)
Admission: EM | Admit: 2021-06-30 | Discharge: 2021-07-02 | DRG: 291 | Disposition: A | Discharge status: Home Health | Payer: Medicare PPO | Attending: Internal Medicine | Admitting: Internal Medicine

## 2021-06-30 DIAGNOSIS — R202 Paresthesia of skin: Secondary | ICD-10-CM | POA: Diagnosis not present

## 2021-06-30 DIAGNOSIS — Z881 Allergy status to other antibiotic agents status: Secondary | ICD-10-CM | POA: Diagnosis not present

## 2021-06-30 DIAGNOSIS — I110 Hypertensive heart disease with heart failure: Secondary | ICD-10-CM | POA: Diagnosis not present

## 2021-06-30 DIAGNOSIS — Z86010 Personal history of colonic polyps: Secondary | ICD-10-CM

## 2021-06-30 DIAGNOSIS — R911 Solitary pulmonary nodule: Secondary | ICD-10-CM | POA: Diagnosis present

## 2021-06-30 DIAGNOSIS — I712 Thoracic aortic aneurysm, without rupture, unspecified: Secondary | ICD-10-CM | POA: Diagnosis not present

## 2021-06-30 DIAGNOSIS — I119 Hypertensive heart disease without heart failure: Secondary | ICD-10-CM | POA: Diagnosis not present

## 2021-06-30 DIAGNOSIS — E1140 Type 2 diabetes mellitus with diabetic neuropathy, unspecified: Secondary | ICD-10-CM

## 2021-06-30 DIAGNOSIS — Z794 Long term (current) use of insulin: Secondary | ICD-10-CM

## 2021-06-30 DIAGNOSIS — Z886 Allergy status to analgesic agent status: Secondary | ICD-10-CM | POA: Diagnosis not present

## 2021-06-30 DIAGNOSIS — Z8679 Personal history of other diseases of the circulatory system: Secondary | ICD-10-CM

## 2021-06-30 DIAGNOSIS — I700 Atherosclerosis of aorta: Secondary | ICD-10-CM | POA: Diagnosis not present

## 2021-06-30 DIAGNOSIS — I7121 Aneurysm of the ascending aorta, without rupture: Secondary | ICD-10-CM | POA: Diagnosis present

## 2021-06-30 DIAGNOSIS — K5730 Diverticulosis of large intestine without perforation or abscess without bleeding: Secondary | ICD-10-CM | POA: Diagnosis not present

## 2021-06-30 DIAGNOSIS — Z90710 Acquired absence of both cervix and uterus: Secondary | ICD-10-CM

## 2021-06-30 DIAGNOSIS — Z888 Allergy status to other drugs, medicaments and biological substances status: Secondary | ICD-10-CM | POA: Diagnosis not present

## 2021-06-30 DIAGNOSIS — G250 Essential tremor: Secondary | ICD-10-CM | POA: Diagnosis present

## 2021-06-30 DIAGNOSIS — R531 Weakness: Secondary | ICD-10-CM

## 2021-06-30 DIAGNOSIS — R404 Transient alteration of awareness: Secondary | ICD-10-CM | POA: Diagnosis not present

## 2021-06-30 DIAGNOSIS — K219 Gastro-esophageal reflux disease without esophagitis: Secondary | ICD-10-CM

## 2021-06-30 DIAGNOSIS — I160 Hypertensive urgency: Secondary | ICD-10-CM | POA: Diagnosis present

## 2021-06-30 DIAGNOSIS — I5033 Acute on chronic diastolic (congestive) heart failure: Secondary | ICD-10-CM

## 2021-06-30 DIAGNOSIS — Z885 Allergy status to narcotic agent status: Secondary | ICD-10-CM | POA: Diagnosis not present

## 2021-06-30 DIAGNOSIS — F419 Anxiety disorder, unspecified: Secondary | ICD-10-CM | POA: Diagnosis present

## 2021-06-30 DIAGNOSIS — M8580 Other specified disorders of bone density and structure, unspecified site: Secondary | ICD-10-CM | POA: Diagnosis present

## 2021-06-30 DIAGNOSIS — R519 Headache, unspecified: Secondary | ICD-10-CM | POA: Diagnosis not present

## 2021-06-30 DIAGNOSIS — Z880 Allergy status to penicillin: Secondary | ICD-10-CM | POA: Diagnosis not present

## 2021-06-30 DIAGNOSIS — M7989 Other specified soft tissue disorders: Principal | ICD-10-CM

## 2021-06-30 DIAGNOSIS — Z87440 Personal history of urinary (tract) infections: Secondary | ICD-10-CM

## 2021-06-30 DIAGNOSIS — K2100 Gastro-esophageal reflux disease with esophagitis, without bleeding: Secondary | ICD-10-CM | POA: Diagnosis not present

## 2021-06-30 DIAGNOSIS — K589 Irritable bowel syndrome without diarrhea: Secondary | ICD-10-CM | POA: Diagnosis present

## 2021-06-30 DIAGNOSIS — E7800 Pure hypercholesterolemia, unspecified: Secondary | ICD-10-CM | POA: Diagnosis present

## 2021-06-30 DIAGNOSIS — R6889 Other general symptoms and signs: Secondary | ICD-10-CM | POA: Diagnosis not present

## 2021-06-30 DIAGNOSIS — I10 Essential (primary) hypertension: Secondary | ICD-10-CM | POA: Diagnosis not present

## 2021-06-30 DIAGNOSIS — Z882 Allergy status to sulfonamides status: Secondary | ICD-10-CM | POA: Diagnosis not present

## 2021-06-30 DIAGNOSIS — R457 State of emotional shock and stress, unspecified: Secondary | ICD-10-CM | POA: Diagnosis not present

## 2021-06-30 DIAGNOSIS — E119 Type 2 diabetes mellitus without complications: Secondary | ICD-10-CM

## 2021-06-30 DIAGNOSIS — R42 Dizziness and giddiness: Secondary | ICD-10-CM | POA: Diagnosis not present

## 2021-06-30 DIAGNOSIS — I5030 Unspecified diastolic (congestive) heart failure: Secondary | ICD-10-CM

## 2021-06-30 DIAGNOSIS — Z79899 Other long term (current) drug therapy: Secondary | ICD-10-CM

## 2021-06-30 DIAGNOSIS — Z743 Need for continuous supervision: Secondary | ICD-10-CM | POA: Diagnosis not present

## 2021-06-30 DIAGNOSIS — E1169 Type 2 diabetes mellitus with other specified complication: Secondary | ICD-10-CM

## 2021-06-30 DIAGNOSIS — R0602 Shortness of breath: Secondary | ICD-10-CM | POA: Diagnosis present

## 2021-06-30 DIAGNOSIS — D509 Iron deficiency anemia, unspecified: Secondary | ICD-10-CM | POA: Diagnosis present

## 2021-06-30 DIAGNOSIS — I509 Heart failure, unspecified: Secondary | ICD-10-CM

## 2021-06-30 DIAGNOSIS — R079 Chest pain, unspecified: Secondary | ICD-10-CM | POA: Diagnosis not present

## 2021-06-30 DIAGNOSIS — J984 Other disorders of lung: Secondary | ICD-10-CM | POA: Diagnosis not present

## 2021-06-30 LAB — CBC WITH DIFFERENTIAL/PLATELET
Abs Immature Granulocytes: 0.03 10*3/uL (ref 0.00–0.07)
Basophils Absolute: 0.1 10*3/uL (ref 0.0–0.1)
Basophils Relative: 1 %
Eosinophils Absolute: 0.1 10*3/uL (ref 0.0–0.5)
Eosinophils Relative: 1 %
HCT: 35.5 % — ABNORMAL LOW (ref 36.0–46.0)
Hemoglobin: 11.1 g/dL — ABNORMAL LOW (ref 12.0–15.0)
Immature Granulocytes: 0 %
Lymphocytes Relative: 37 %
Lymphs Abs: 3.6 10*3/uL (ref 0.7–4.0)
MCH: 25.1 pg — ABNORMAL LOW (ref 26.0–34.0)
MCHC: 31.3 g/dL (ref 30.0–36.0)
MCV: 80.3 fL (ref 80.0–100.0)
Monocytes Absolute: 0.5 10*3/uL (ref 0.1–1.0)
Monocytes Relative: 6 %
Neutro Abs: 5.4 10*3/uL (ref 1.7–7.7)
Neutrophils Relative %: 55 %
Platelets: 224 10*3/uL (ref 150–400)
RBC: 4.42 MIL/uL (ref 3.87–5.11)
RDW: 16.8 % — ABNORMAL HIGH (ref 11.5–15.5)
WBC: 9.7 10*3/uL (ref 4.0–10.5)
nRBC: 0 % (ref 0.0–0.2)

## 2021-06-30 LAB — URINALYSIS, ROUTINE W REFLEX MICROSCOPIC
Bilirubin Urine: NEGATIVE
Glucose, UA: NEGATIVE mg/dL
Hgb urine dipstick: NEGATIVE
Ketones, ur: NEGATIVE mg/dL
Leukocytes,Ua: NEGATIVE
Nitrite: NEGATIVE
Protein, ur: NEGATIVE mg/dL
Specific Gravity, Urine: 1.028 (ref 1.005–1.030)
pH: 6 (ref 5.0–8.0)

## 2021-06-30 LAB — I-STAT CHEM 8, ED
BUN: 32 mg/dL — ABNORMAL HIGH (ref 8–23)
Calcium, Ion: 1.23 mmol/L (ref 1.15–1.40)
Chloride: 105 mmol/L (ref 98–111)
Creatinine, Ser: 0.9 mg/dL (ref 0.44–1.00)
Glucose, Bld: 155 mg/dL — ABNORMAL HIGH (ref 70–99)
HCT: 37 % (ref 36.0–46.0)
Hemoglobin: 12.6 g/dL (ref 12.0–15.0)
Potassium: 4.3 mmol/L (ref 3.5–5.1)
Sodium: 139 mmol/L (ref 135–145)
TCO2: 24 mmol/L (ref 22–32)

## 2021-06-30 LAB — TROPONIN I (HIGH SENSITIVITY)
Troponin I (High Sensitivity): 10 ng/L (ref <18)
Troponin I (High Sensitivity): 21 ng/L — ABNORMAL HIGH (ref <18)

## 2021-06-30 LAB — COMPREHENSIVE METABOLIC PANEL
ALT: 11 U/L (ref 0–44)
AST: 17 U/L (ref 15–41)
Albumin: 3.9 g/dL (ref 3.5–5.0)
Alkaline Phosphatase: 56 U/L (ref 38–126)
Anion gap: 7 (ref 5–15)
BUN: 33 mg/dL — ABNORMAL HIGH (ref 8–23)
CO2: 25 mmol/L (ref 22–32)
Calcium: 9.6 mg/dL (ref 8.9–10.3)
Chloride: 108 mmol/L (ref 98–111)
Creatinine, Ser: 0.85 mg/dL (ref 0.44–1.00)
GFR, Estimated: >60 mL/min (ref >60)
Glucose, Bld: 163 mg/dL — ABNORMAL HIGH (ref 70–99)
Potassium: 4.4 mmol/L (ref 3.5–5.1)
Sodium: 140 mmol/L (ref 135–145)
Total Bilirubin: 0.4 mg/dL (ref 0.3–1.2)
Total Protein: 7.1 g/dL (ref 6.5–8.1)

## 2021-06-30 LAB — MAGNESIUM: Magnesium: 1.8 mg/dL (ref 1.7–2.4)

## 2021-06-30 LAB — BRAIN NATRIURETIC PEPTIDE: B Natriuretic Peptide: 139.4 pg/mL — ABNORMAL HIGH (ref 0.0–100.0)

## 2021-06-30 LAB — GLUCOSE, CAPILLARY: Glucose-Capillary: 116 mg/dL — ABNORMAL HIGH (ref 70–99)

## 2021-06-30 MED ORDER — ACETAMINOPHEN 325 MG PO TABS
650.0000 mg | ORAL_TABLET | Freq: Four times a day (QID) | ORAL | Status: DC | PRN
  Administered 2021-06-30 – 2021-07-02 (×2): 650 mg via ORAL
  Filled 2021-06-30 (×2): qty 2

## 2021-06-30 MED ORDER — HYDRALAZINE HCL 10 MG PO TABS
10.0000 mg | ORAL_TABLET | Freq: Three times a day (TID) | ORAL | Status: DC
  Administered 2021-06-30 – 2021-07-02 (×5): 10 mg via ORAL
  Filled 2021-06-30 (×5): qty 1

## 2021-06-30 MED ORDER — IOHEXOL 350 MG/ML SOLN
100.0000 mL | Freq: Once | INTRAVENOUS | Status: AC | PRN
  Administered 2021-06-30: 100 mL via INTRAVENOUS

## 2021-06-30 MED ORDER — IOHEXOL 300 MG/ML  SOLN: 100.0000 mL | Freq: Once | INTRAMUSCULAR | Status: DC | PRN

## 2021-06-30 MED ORDER — SODIUM CHLORIDE 0.9% FLUSH
3.0000 mL | Freq: Two times a day (BID) | INTRAVENOUS | Status: DC
  Administered 2021-06-30 – 2021-07-02 (×4): 3 mL via INTRAVENOUS

## 2021-06-30 MED ORDER — FUROSEMIDE 10 MG/ML IJ SOLN
40.0000 mg | Freq: Once | INTRAMUSCULAR | Status: AC
  Administered 2021-06-30: 40 mg via INTRAVENOUS
  Filled 2021-06-30: qty 4

## 2021-06-30 MED ORDER — FUROSEMIDE 10 MG/ML IJ SOLN
40.0000 mg | Freq: Every day | INTRAMUSCULAR | Status: DC
Start: 2021-07-01 — End: 2021-07-02
  Administered 2021-07-01: 40 mg via INTRAVENOUS
  Filled 2021-06-30 (×2): qty 4

## 2021-06-30 MED ORDER — ALBUTEROL SULFATE HFA 108 (90 BASE) MCG/ACT IN AERS: 2.0000 | INHALATION_SPRAY | RESPIRATORY_TRACT | Status: DC | PRN

## 2021-06-30 MED ORDER — INSULIN ASPART 100 UNIT/ML IJ SOLN: 0.0000 [IU] | Freq: Three times a day (TID) | INTRAMUSCULAR | Status: DC

## 2021-06-30 MED ORDER — INSULIN GLARGINE-YFGN 100 UNIT/ML ~~LOC~~ SOLN
10.0000 [IU] | Freq: Every day | SUBCUTANEOUS | Status: DC
  Administered 2021-07-01 – 2021-07-02 (×2): 10 [IU] via SUBCUTANEOUS
  Filled 2021-06-30 (×2): qty 0.1

## 2021-06-30 MED ORDER — ENOXAPARIN SODIUM 40 MG/0.4ML IJ SOSY
40.0000 mg | PREFILLED_SYRINGE | INTRAMUSCULAR | Status: DC
  Administered 2021-06-30 – 2021-07-01 (×2): 40 mg via SUBCUTANEOUS
  Filled 2021-06-30 (×2): qty 0.4

## 2021-06-30 MED ORDER — PANTOPRAZOLE SODIUM 40 MG PO TBEC
40.0000 mg | DELAYED_RELEASE_TABLET | Freq: Every day | ORAL | Status: DC
  Administered 2021-07-01 – 2021-07-02 (×2): 40 mg via ORAL
  Filled 2021-06-30 (×2): qty 1

## 2021-06-30 MED ORDER — SODIUM CHLORIDE (PF) 0.9 % IJ SOLN
INTRAMUSCULAR | Status: AC
  Filled 2021-06-30: qty 50

## 2021-06-30 MED ORDER — SPIRONOLACTONE 12.5 MG HALF TABLET
12.5000 mg | ORAL_TABLET | Freq: Every day | ORAL | Status: DC
  Administered 2021-07-01 – 2021-07-02 (×2): 12.5 mg via ORAL
  Filled 2021-06-30 (×2): qty 1

## 2021-06-30 MED ORDER — TRANDOLAPRIL 4 MG PO TABS
4.0000 mg | ORAL_TABLET | Freq: Two times a day (BID) | ORAL | Status: DC
  Administered 2021-06-30 – 2021-07-01 (×3): 4 mg via ORAL
  Filled 2021-06-30 (×5): qty 1

## 2021-06-30 MED ORDER — LABETALOL HCL 5 MG/ML IV SOLN
10.0000 mg | Freq: Once | INTRAVENOUS | Status: AC
  Administered 2021-06-30: 10 mg via INTRAVENOUS
  Filled 2021-06-30: qty 4

## 2021-06-30 MED ORDER — ACETAMINOPHEN 650 MG RE SUPP: 650.0000 mg | Freq: Four times a day (QID) | RECTAL | Status: DC | PRN

## 2021-06-30 MED ORDER — POLYETHYLENE GLYCOL 3350 17 G PO PACK: 17.0000 g | PACK | Freq: Every day | ORAL | Status: DC | PRN

## 2021-06-30 NOTE — ED Notes (Signed)
Pt in CT SCAN will get vitals when pt return

## 2021-06-30 NOTE — ED Notes (Signed)
Pt stated cant void at this time 

## 2021-06-30 NOTE — ED Notes (Signed)
Pt refusing covid swab d/t polyps in nose, does not want to case a nose bleed.

## 2021-06-30 NOTE — ED Provider Notes (Signed)
[facility] DEPT Provider Note   CSN: [id number] Arrival date & time: 06/30/21  1706     History  Chief Complaint  Patient presents with   Anxiety   Hypertension   Shortness of Breath    BLASA RAISCH is a 88 y.o. female hx of hypertension, thoracic aneurysm, here presenting with shortness of breath, leg swelling, dizziness.  Patient states that she is the sole caregiver of her husband who is 25 years old.  Patient states that she has been feeling dizzy and her legs has been swollen.  She states that she feels short of breath as well. Patient has hx of diastolic heart failure. Patient denies any fevers. Has suprapubic pain and hx of recurrent UTIs.   The history is provided by the patient.      Home Medications Prior to Admission medications   Medication Sig Start Date End Date Taking? Authorizing Provider  ACCU-CHEK GUIDE test strip  02/23/21   [provider]  acetaminophen (TYLENOL) 500 MG tablet Take 500 mg by mouth every 6 (six) hours as needed for mild pain, moderate pain, fever or headache.    [provider]  acetaminophen (TYLENOL) 500 MG tablet 1 tablet as needed    [provider]  BD PEN NEEDLE NANO 2ND GEN 32G X 4 MM MISC  04/14/21   [provider]  Cholecalciferol (VITAMIN D-3) 1000 UNITS CAPS Take 3 capsules by mouth at bedtime.     [provider]  hydrALAZINE (APRESOLINE) 10 MG tablet Take 10 mg by mouth 3 (three) times daily.    [provider]  hydrALAZINE (APRESOLINE) 25 MG tablet Take by mouth. 04/20/21   [provider]  hydrocortisone (ANUSOL-HC) 2.5 % rectal cream Apply to perianal area 3 times daily. 11/14/17   Zehr, Janett Billow D, PA-C  insulin glargine (LANTUS) 100 UNIT/ML Solostar Pen Inject 20 Units into the skin daily with breakfast.    [provider]  Insulin Pen Needle (BD PEN NEEDLE NANO 2ND GEN) 32G X 4 MM MISC See admin instructions.    [provider]  metoprolol tartrate (LOPRESSOR) 25 MG tablet 1/2 tablet    [provider]  Multiple Vitamin (MULTIVITAMIN) tablet Take 1 tablet by mouth daily.      [provider]  pantoprazole (PROTONIX) 40 MG tablet TAKE 1 TABLET BY MOUTH EVERY DAY BEFORE BREAKFAST 02/03/20   Gatha Mayer, MD  predniSONE (DELTASONE) 10 MG tablet Take by mouth. 11/20/20   [provider]  spironolactone (ALDACTONE) 25 MG tablet Take 0.5 tablets (12.5 mg total) by mouth daily. 08/04/20   Sueanne Margarita, MD  trandolapril (MAVIK) 4 MG tablet Take 1 tablet (4 mg total) by mouth 2 (two) times daily. 08/04/20   Sueanne Margarita, MD      Allergies    Actos [pioglitazone], Avandia [rosiglitazone], Fosamax [alendronate sodium], Morphine, Ultram [tramadol], Glimepiride, Januvia [sitagliptin], Lipitor [atorvastatin], Metformin and related, Prandin [repaglinide], Tradjenta [linagliptin], Zoloft [sertraline hcl], Aspirin, Bentyl [dicyclomine hcl], Ciprofloxacin, Dicyclomine, Hctz [hydrochlorothiazide], Keflex [cephalexin], Morphine and related, Norvasc [amlodipine besylate], Oxycodone, Sulfa antibiotics, Sulfamethoxazole-trimethoprim, and Penicillins    Review of Systems   Review of Systems  Respiratory:  Positive for shortness of breath.   All other systems reviewed and are negative.  Physical Exam Updated Vital Signs BP (!) 148/136 (BP Location: Right Arm)   Pulse 74   Temp 97.7 F (36.5 C) (Oral)   Resp (!) 21   SpO2 97%  Physical Exam Vitals and nursing note reviewed.  Constitutional:      Comments: Chronically ill   HENT:     Head: Normocephalic.     Mouth/Throat:     Mouth: Mucous membranes are moist.  Eyes:     Extraocular Movements: Extraocular movements intact.     Pupils: Pupils are equal, round, and reactive to light.  Cardiovascular:     Rate and Rhythm: Normal rate and regular rhythm.  Pulmonary:     Comments: Tachypneic, diminished bilaterally  Abdominal:      General: Bowel sounds are normal.     Palpations: Abdomen is soft.     Comments: Mild suprapubic tenderness   Musculoskeletal:     Cervical back: Normal range of motion and neck supple.     Comments: 2+ edema bilateral legs   Skin:    Capillary Refill: Capillary refill takes less than 2 seconds.  Neurological:     General: No focal deficit present.     Mental Status: She is oriented to person, place, and time.  Psychiatric:        Mood and Affect: Mood normal.        Behavior: Behavior normal.    ED Results / Procedures / Treatments   Labs (all labs ordered are listed, but only abnormal results are displayed) Labs Reviewed  CBC WITH DIFFERENTIAL/PLATELET - Abnormal; Notable for the following components:      Result Value   Hemoglobin 11.1 (*)    HCT 35.5 (*)    MCH 25.1 (*)    RDW 16.8 (*)    All other components within normal limits  COMPREHENSIVE METABOLIC PANEL - Abnormal; Notable for the following components:   Glucose, Bld 163 (*)    BUN 33 (*)    All other components within normal limits  BRAIN NATRIURETIC PEPTIDE - Abnormal; Notable for the following components:   B Natriuretic Peptide 139.4 (*)    All other components within normal limits  URINALYSIS, ROUTINE W REFLEX MICROSCOPIC - Abnormal; Notable for the following components:   Color, Urine STRAW (*)    All other components within normal limits  I-STAT CHEM 8, ED - Abnormal; Notable for the following components:   BUN 32 (*)    Glucose, Bld 155 (*)    All other components within normal limits  URINE CULTURE  SARS CORONAVIRUS 2 BY RT PCR  TROPONIN I (HIGH SENSITIVITY)  TROPONIN I (HIGH SENSITIVITY)    EKG EKG Interpretation  Date/Time:  Wednesday Jun 30 2021 17:16:24 EDT Ventricular Rate:  72 PR Interval:  159 QRS Duration: 95 QT Interval:  377 QTC Calculation: 413 R Axis:   60 Text Interpretation: Sinus rhythm Atrial premature complexes in couplets No significant change since last tracing Confirmed  by Wandra Arthurs [phone]) on 06/30/2021 5:47:10 PM  Radiology DG Chest 2 View  Result Date: 06/30/2021 CLINICAL DATA:  Chest pain EXAM: CHEST - 2 VIEW COMPARISON:  CTA chest dated 09/18/2020 FINDINGS: Lungs are clear.  No pleural effusion or pneumothorax. The heart is normal in size. Mild degenerative changes of the visualized thoracolumbar spine. IMPRESSION: Normal chest radiographs. Electronically Signed   By: Julian Hy M.D.   On: 06/30/2021 18:33   CT HEAD WO CONTRAST (5MM)  Result Date: 06/30/2021 CLINICAL DATA:  Dizziness, headache, hypertension EXAM: CT HEAD WITHOUT CONTRAST TECHNIQUE: Contiguous axial images were obtained from the base of the skull through the vertex without intravenous contrast. RADIATION DOSE REDUCTION: This exam was performed according  to the departmental dose-optimization program which includes automated exposure control, adjustment of the mA and/or kV according to patient size and/or use of iterative reconstruction technique. COMPARISON:  05/04/2020 FINDINGS: Brain: No acute infarct or hemorrhage. Lateral ventricles and midline structures are unremarkable. No acute extra-axial fluid collections. No mass effect. Age-appropriate cerebral atrophy. Vascular: Aneurysm coil at the right cavernous sinus unchanged. No hyperdense vessel. Skull: Normal. Negative for fracture or focal lesion. Sinuses/Orbits: No acute finding. Other: None. IMPRESSION: 1. No acute intracranial process. Electronically Signed   By: Randa Ngo M.D.   On: 06/30/2021 19:24   CT Angio Chest/Abd/Pel for Dissection W and/or Wo Contrast  Result Date: 06/30/2021 CLINICAL DATA:  Hypertension and dizziness. EXAM: CT ANGIOGRAPHY CHEST, ABDOMEN AND PELVIS TECHNIQUE: Noncontrast chest CT was performed. Multidetector CT imaging through the chest, abdomen and pelvis was performed using the standard protocol during bolus administration of intravenous contrast. Multiplanar reconstructed images and MIPs were  obtained and reviewed to evaluate the vascular anatomy. RADIATION DOSE REDUCTION: This exam was performed according to the departmental dose-optimization program which includes automated exposure control, adjustment of the mA and/or kV according to patient size and/or use of iterative reconstruction technique. CONTRAST:  157m OMNIPAQUE IOHEXOL 350 MG/ML SOLN COMPARISON:  CT abdomen and pelvis 12/27/2014.CT angiogram chest 09/18/2020. FINDINGS: CTA CHEST FINDINGS Cardiovascular: Preferential opacification of the thoracic aorta. There is stable aneurysmal dilatation of the ascending thoracic aorta measuring up to 4.4 cm, unchanged from the prior examination. No evidence for dissection. There are atherosclerotic calcifications of the aorta. No central pulmonary embolism identified. Heart is mildly enlarged, unchanged. No pericardial effusion. Mediastinum/Nodes: No enlarged mediastinal, hilar, or axillary lymph nodes. Thyroid gland, trachea, and esophagus demonstrate no significant findings. Lungs/Pleura: There is some scarring in the lung apices. There is a 3 mm nodule in the right lower lobe image 5/79 which is new from prior. There is no focal lung infiltrate, pleural effusion, or pneumothorax. Musculoskeletal: No chest wall abnormality. No acute or significant osseous findings. Review of the MIP images confirms the above findings. CTA ABDOMEN AND PELVIS FINDINGS VASCULAR Aorta: Normal caliber aorta without aneurysm, dissection, vasculitis or significant stenosis. There are mild atherosclerotic calcifications of the aorta. Celiac: Patent without evidence of aneurysm, dissection, vasculitis or significant stenosis. SMA: Patent without evidence of aneurysm, dissection, vasculitis or significant stenosis. Renals: Both renal arteries are patent without evidence of aneurysm, dissection, vasculitis, fibromuscular dysplasia or significant stenosis. IMA: Patent without evidence of aneurysm, dissection, vasculitis or  significant stenosis. Inflow: Patent without evidence of aneurysm, dissection, vasculitis or significant stenosis. Veins: No obvious venous abnormality within the limitations of this arterial phase study. Review of the MIP images confirms the above findings. NON-VASCULAR Hepatobiliary: No focal liver abnormality is seen. No gallstones, gallbladder wall thickening, or biliary dilatation. Pancreas: There is fatty infiltration of the pancreas, unchanged. No acute inflammation. Spleen: Normal in size without focal abnormality. Adrenals/Urinary Tract: There is some scarring in the lower poles of the kidneys. Otherwise, the kidneys, adrenal glands and bladder are within normal limits. Stomach/Bowel: Stomach is within normal limits. no evidence of bowel wall thickening, distention, or inflammatory changes. There is diffuse colonic diverticulosis. The appendix is not seen. There is a large amount of stool in the rectum. Lymphatic: No enlarged lymph nodes are seen. Reproductive: Status post hysterectomy. No adnexal masses. Other: No abdominal wall hernia or abnormality. No abdominopelvic ascites. Musculoskeletal: No acute or significant osseous findings. Review of the MIP images confirms the above findings. IMPRESSION: 1. Unchanged 4.4  cm ascending aortic aneurysm. Recommend annual imaging followup by CTA or MRA. This recommendation follows 2010 ACCF/AHA/AATS/ACR/ASA/SCA/SCAI/SIR/STS/SVM Guidelines for the Diagnosis and Management of Patients with Thoracic Aortic Disease. Circulation. 2010; 121: U314-H702. Aortic aneurysm NOS (ICD10-I71.9) 2. No evidence for aortic dissection. 3. No acute localizing process in the chest, abdomen or pelvis. 4. Stable mild cardiomegaly. 5. 3 mm right solid pulmonary nodule. No routine follow-up imaging is recommended per Fleischner Society Guidelines. These guidelines do not apply to immunocompromised patients and patients with cancer. Follow up in patients with significant comorbidities as  clinically warranted. For lung cancer screening, adhere to Lung-RADS guidelines. Reference: Radiology. 2017; 284(1):228-43. 6. Colonic diverticulosis with large amount of stool in the rectum. 7.  Aortic Atherosclerosis (ICD10-I70.0). Electronically Signed   By: Ronney Asters M.D.   On: 06/30/2021 19:38    Procedures Procedures    Medications Ordered in ED Medications  albuterol (VENTOLIN HFA) 108 (90 Base) MCG/ACT inhaler 2 puff (has no administration in time range)  iohexol (OMNIPAQUE) 300 MG/ML solution 100 mL (has no administration in time range)  sodium chloride (PF) 0.9 % injection (has no administration in time range)  furosemide (LASIX) injection 40 mg (has no administration in time range)  labetalol (NORMODYNE) injection 10 mg (10 mg Intravenous Given 06/30/21 1841)  iohexol (OMNIPAQUE) 350 MG/ML injection 100 mL (100 mLs Intravenous Contrast Given 06/30/21 1907)    ED Course/ Medical Decision Making/ A&P                           Medical Decision Making BRITTANIA SUDBECK is a 88 y.o. female here with leg swelling, dizziness, chest pain. Patient is hypertensive in the ED. Concerned for dissection given hx of thoracic aneurysm vs hypertensive urgency vs UTI vs CHF exacerbation. Plan to get dissection study, cbc, cmp, trop, BNP, UA   8:47 PM I reviewed labs and independently interpreted her imaging studies.  CT head unremarkable and CTA showed stable thoracic aneurysm.  There is no dissection currently. She appears volume overloaded. UA normal. Started on lasix. Will admit for hypertensive urgency, CHF.     Problems Addressed: Hypertensive urgency: acute illness or injury Leg swelling: acute illness or injury  Amount and/or Complexity of Data Reviewed Labs: ordered. Decision-making details documented in ED Course. Radiology: ordered and independent interpretation performed. Decision-making details documented in ED Course. ECG/medicine tests: ordered and independent interpretation  performed. Decision-making details documented in ED Course.  Risk Prescription drug management. Decision regarding hospitalization.    Final Clinical Impression(s) / ED Diagnoses Final diagnoses:  None    Rx / DC Orders ED Discharge Orders     None         Drenda Freeze, MD 06/30/21 2049

## 2021-06-30 NOTE — H&P (Signed)
History and Physical   [name] BLT:[id number] DOB: [birth year] DOA: 06/30/2021  PCP: Antony Contras, MD   Patient coming from: Home  Chief Complaint: Dyspnea, Edema, Dizziness  HPI: [name] is a 88 y.o. female with medical history significant of GERD, IBS, anxiety, hypertension, low back pain, diabetes, hyperlipidemia, essential tremor, cerebral aneurysm, aortic aneurysm, neuropathy, diverticulosis, carotid artery disease, cervical radiculopathy presenting with shortness of breath, edema, dizziness.  Patient states she has had new onset lower extremity edema for the past 2 months with some associated shortness of breath for the past 6 months, worse for the past 2 months.  Shortness of breath does wax and wane.  Also reports feeling dizzy, lightheaded on standing..  She has had prior echocardiogram which showed some diastolic dysfunction.  She reports she is a sole caregiver for her husband is 62 year old.  She is also reporting generalized weakness recently for at least a couple months.  She denies fevers, chills, chest pain, abdominal pain, constipation, diarrhea, nausea, vomiting.  ED Course: Vital signs in the ED significant for initial blood pressure in the 007M systolic now improved to the 226J to 335K systolic.  Lab work-up included CMP with BUN 33, glucose 163.  CBC with hemoglobin of 11.1 from baseline around 11-12.  Troponin normal with repeat pending.  BNP elevated to 139.  Rest were panel for flu and COVID not collected due to patient's concern about nasal polyps.  Urinalysis and urine culture pending.  Chest x-ray without acute abnormality.  CT head showed no acute normality.  CT of the chest abdomen pelvis showed stable aortic aneurysm without dissection and no acute abnormality of the chest abdomen or pelvis.  Did show 3 mm right pulmonary nodule with no follow-up recommended.  Patient received Lasix, labetalol, albuterol in the ED.  Review of Systems: As per HPI  otherwise all other systems reviewed and are negative.  Past Medical History:  Diagnosis Date   Abdominal pain, chronic, left lower quadrant    Acute torn meniscus of knee    Adenomatous colon polyp 1970   carcinoma in situ   Allergic rhinitis    Amaurosis fugax 08/07/2012   Anxiety    Ascending aortic aneurysm ([place]) 12/07/2015   37m by chest CTA 08/2020   Benign essential tremor syndrome    Bicuspid aortic valve    no AS on 07/2019   Carotid stenosis    1039 bilateral by dopplers 03/2017.    colon ca dx'd 1970   surg only   DDD (degenerative disc disease)    Diverticulitis    Diverticulosis    DM [place]abetes [place]    Duodenitis    peptic, with gastric heterotopia   Endometriosis    s/p hysterectomy   Fundic gland polyposis of stomach    GERD (gastroesophageal reflux disease)    Heart murmur    Hiatal hernia 02/03/2005   History of cerebral aneurysm repair    s/p coiling   History of hemorrhoids    with bleeding   History of shingles    HLD (hyperlipidemia)    HTN (hypertension)    Hypercholesteremia    IBS (irritable bowel syndrome)    Iron deficiency    Migraine    MVP (mitral valve prolapse)    Osteopenia    Peripheral neuropathy    Toe fracture, right    second toe   UTI (lower urinary tract infection)    Varicose vein     Past Surgical History:  Procedure Laterality Date   ABDOMINAL HYSTERECTOMY     APPENDECTOMY     CARPAL TUNNEL RELEASE  08/26/07   CATARACT EXTRACTION     CEREBRAL ANEURYSM REPAIR     COLON RESECTION     COLONOSCOPY  06/07/08   divertiulosis, internal hemorrhoids   COLONOSCOPY W/ BIOPSIES AND POLYPECTOMY  02/03/05   diverticulosis, 4 mm sessile polyps, internal and external hemorrhoids   CORONARY ANGIOPLASTY WITH STENT PLACEMENT     ESOPHAGOGASTRODUODENOSCOPY  02/03/05   hiatal hernia, 6 benign gastric polyps   FLEXIBLE SIGMOIDOSCOPY     HAND SURGERY Right    INTRAOCULAR LENS INSERTION     right hand decompressive fasciotomy   08/26/07   , dorsal and volar   TONSILLECTOMY      Social History  reports that she has never smoked. She has never used smokeless tobacco. She reports that she does not drink alcohol and does not use drugs.  Allergies  Allergen Reactions   Actos [Pioglitazone]     Chronic  Pedal edema:contraindication   Avandia [Rosiglitazone]     Chronic pedal edema:contraindication   Fosamax [Alendronate Sodium]     unknown   Morphine Other (See Comments)   Ultram [Tramadol] Shortness Of Breath and Palpitations   Glimepiride     hypersensitivity   Januvia [Sitagliptin]     hypersensitivity   Lipitor [Atorvastatin]     Causes muscle pain and swelling   Metformin And Related     gastritis   Prandin [Repaglinide]     Abdominal pain: side effects   Tradjenta [Linagliptin]     GI problems, hypersensitivity   Zoloft [Sertraline Hcl]     Jittery or zombie like   Aspirin Other (See Comments)    Bleeding ulcers    Bentyl [Dicyclomine Hcl]     Came close to passing out. weak   Ciprofloxacin Nausea And Vomiting    Severe stomach upset   Dicyclomine     Other reaction(s): Unknown   Hctz [Hydrochlorothiazide] Other (See Comments)    URINARY ISSUES   Keflex [Cephalexin]    Morphine And Related Other (See Comments)    Body Spasms   Norvasc [Amlodipine Besylate]     Weak and dizzy.    Oxycodone    Sulfa Antibiotics Hives   Sulfamethoxazole-Trimethoprim     Other reaction(s): hives   Penicillins Swelling and Rash    Amoxicillin causes stomach upset.    Family History  Problem Relation Age of Onset   Ovarian cancer Mother    Heart disease Father    Kidney disease Father    Diabetes Paternal Grandmother    Diabetes Brother    Hypertension Brother    Heart disease Brother    Diabetes Brother    Heart disease Brother    Microcephaly Brother    Diabetes Other    Colon cancer Other   Reviewed on admission  Prior to Admission medications   Medication Sig Start Date End Date Taking?  Authorizing Provider  acetaminophen (TYLENOL) 500 MG tablet Take 500 mg by mouth every 6 (six) hours as needed for mild pain, moderate pain, fever or headache.   Yes [provider]  Cholecalciferol (VITAMIN D-3) 1000 UNITS CAPS Take 3 capsules by mouth in the morning.   Yes [provider]  hydrALAZINE (APRESOLINE) 10 MG tablet Take 10 mg by mouth 3 (three) times daily.   Yes [provider]  insulin glargine (LANTUS) 100 UNIT/ML Solostar Pen Inject 20 Units into the  skin daily with breakfast.   Yes [provider]  Multiple Vitamin (MULTIVITAMIN) tablet Take 1 tablet by mouth daily.     Yes [provider]  pantoprazole (PROTONIX) 40 MG tablet TAKE 1 TABLET BY MOUTH EVERY DAY BEFORE BREAKFAST 02/03/20  Yes Gatha Mayer, MD  spironolactone (ALDACTONE) 25 MG tablet Take 0.5 tablets (12.5 mg total) by mouth daily. 08/04/20  Yes Turner, Eber Hong, MD  trandolapril (MAVIK) 4 MG tablet Take 1 tablet (4 mg total) by mouth 2 (two) times daily. 08/04/20  Yes Sueanne Margarita, MD  ACCU-CHEK GUIDE test strip  02/23/21   [provider]  BD PEN NEEDLE NANO 2ND GEN 32G X 4 MM MISC  04/14/21   [provider]  hydrocortisone (ANUSOL-HC) 2.5 % rectal cream Apply to perianal area 3 times daily. Patient not taking: Reported on 06/30/2021 11/14/17   Zehr, Janett Billow D, PA-C  Insulin Pen Needle (BD PEN NEEDLE NANO 2ND GEN) 32G X 4 MM MISC See admin instructions.    [provider]    Physical Exam: Vitals:   06/30/21 1919 06/30/21 2000 06/30/21 2030 06/30/21 2100  BP: (!) 148/136 (!) 146/119 (!) 159/68 (!) 167/110  Pulse: 74 79 63 65  Resp: (!) 21 (!) 24 (!) 23 18  Temp: 97.7 F (36.5 C)     TempSrc: Oral     SpO2: 97% 92% 98% 98%    Physical Exam Constitutional:      General: She is not in acute distress.    Appearance: Normal appearance.  HENT:     Head: Normocephalic and atraumatic.     Mouth/Throat:     Mouth: Mucous membranes are  moist.     Pharynx: Oropharynx is clear.  Eyes:     Extraocular Movements: Extraocular movements intact.     Pupils: Pupils are equal, round, and reactive to light.  Cardiovascular:     Rate and Rhythm: Normal rate and regular rhythm.     Pulses: Normal pulses.     Heart sounds: Normal heart sounds.  Pulmonary:     Effort: Pulmonary effort is normal. No respiratory distress.     Breath sounds: Examination of the left-lower field reveals rales. Rales present.  Abdominal:     General: Bowel sounds are normal. There is no distension.     Palpations: Abdomen is soft.     Tenderness: There is no abdominal tenderness.  Musculoskeletal:        General: No swelling or deformity.     Right lower leg: Edema present.     Left lower leg: Edema present.  Skin:    General: Skin is warm and dry.  Neurological:     General: No focal deficit present.     Mental Status: Mental status is at baseline.   Labs on Admission: I have personally reviewed following labs and imaging studies  CBC: Recent Labs  Lab 06/30/21 1806 06/30/21 1848  WBC 9.7  --   NEUTROABS 5.4  --   HGB 11.1* 12.6  HCT 35.5* 37.0  MCV 80.3  --   PLT 224  --     Basic Metabolic Panel: Recent Labs  Lab 06/30/21 1806 06/30/21 1848  NA 140 139  K 4.4 4.3  CL 108 105  CO2 25  --   GLUCOSE 163* 155*  BUN 33* 32*  CREATININE 0.85 0.90  CALCIUM 9.6  --     GFR: CrCl cannot be calculated (Unknown ideal weight.).  Liver Function  Tests: Recent Labs  Lab 06/30/21 1806  AST 17  ALT 11  ALKPHOS 56  BILITOT 0.4  PROT 7.1  ALBUMIN 3.9    Urine analysis:    Component Value Date/Time   COLORURINE STRAW (A) 06/30/2021 2001   APPEARANCEUR CLEAR 06/30/2021 2001   LABSPEC 1.028 06/30/2021 2001   PHURINE 6.0 06/30/2021 2001   GLUCOSEU NEGATIVE 06/30/2021 2001   [place] NEGATIVE 06/30/2021 2001   [place] NEGATIVE 06/30/2021 2001   [place] 06/30/2021 2001   PROTEINUR NEGATIVE 06/30/2021 2001    UROBILINOGEN 0.2 04/01/2014 1627   NITRITE NEGATIVE 06/30/2021 2001   LEUKOCYTESUR NEGATIVE 06/30/2021 2001    Radiological Exams on Admission: DG Chest 2 View  Result Date: 06/30/2021 CLINICAL DATA:  Chest pain EXAM: CHEST - 2 VIEW COMPARISON:  CTA chest dated 09/18/2020 FINDINGS: Lungs are clear.  No pleural effusion or pneumothorax. The heart is normal in size. Mild degenerative changes of the visualized thoracolumbar spine. IMPRESSION: Normal chest radiographs. Electronically Signed   By: Julian Hy M.D.   On: 06/30/2021 18:33   CT HEAD WO CONTRAST (5MM)  Result Date: 06/30/2021 CLINICAL DATA:  Dizziness, headache, hypertension EXAM: CT HEAD WITHOUT CONTRAST TECHNIQUE: Contiguous axial images were obtained from the base of the skull through the vertex without intravenous contrast. RADIATION DOSE REDUCTION: This exam was performed according to the departmental dose-optimization program which includes automated exposure control, adjustment of the mA and/or kV according to patient size and/or use of iterative reconstruction technique. COMPARISON:  05/04/2020 FINDINGS: Brain: No acute infarct or hemorrhage. Lateral ventricles and midline structures are unremarkable. No acute extra-axial fluid collections. No mass effect. Age-appropriate cerebral atrophy. Vascular: Aneurysm coil at the right cavernous sinus unchanged. No hyperdense vessel. Skull: Normal. Negative for fracture or focal lesion. Sinuses/Orbits: No acute finding. Other: None. IMPRESSION: 1. No acute intracranial process. Electronically Signed   By: Randa Ngo M.D.   On: 06/30/2021 19:24   CT Angio Chest/Abd/Pel for Dissection W and/or Wo Contrast  Result Date: 06/30/2021 CLINICAL DATA:  Hypertension and dizziness. EXAM: CT ANGIOGRAPHY CHEST, ABDOMEN AND PELVIS TECHNIQUE: Noncontrast chest CT was performed. Multidetector CT imaging through the chest, abdomen and pelvis was performed using the standard protocol during bolus  administration of intravenous contrast. Multiplanar reconstructed images and MIPs were obtained and reviewed to evaluate the vascular anatomy. RADIATION DOSE REDUCTION: This exam was performed according to the departmental dose-optimization program which includes automated exposure control, adjustment of the mA and/or kV according to patient size and/or use of iterative reconstruction technique. CONTRAST:  178m OMNIPAQUE IOHEXOL 350 MG/ML SOLN COMPARISON:  CT abdomen and pelvis 12/27/2014.CT angiogram chest 09/18/2020. FINDINGS: CTA CHEST FINDINGS Cardiovascular: Preferential opacification of the thoracic aorta. There is stable aneurysmal dilatation of the ascending thoracic aorta measuring up to 4.4 cm, unchanged from the prior examination. No evidence for dissection. There are atherosclerotic calcifications of the aorta. No central pulmonary embolism identified. Heart is mildly enlarged, unchanged. No pericardial effusion. Mediastinum/Nodes: No enlarged mediastinal, hilar, or axillary lymph nodes. Thyroid gland, trachea, and esophagus demonstrate no significant findings. Lungs/Pleura: There is some scarring in the lung apices. There is a 3 mm nodule in the right lower lobe image 5/79 which is new from prior. There is no focal lung infiltrate, pleural effusion, or pneumothorax. Musculoskeletal: No chest wall abnormality. No acute or significant osseous findings. Review of the MIP images confirms the above findings. CTA ABDOMEN AND PELVIS FINDINGS VASCULAR Aorta: Normal caliber aorta without aneurysm, dissection, vasculitis or significant stenosis.  There are mild atherosclerotic calcifications of the aorta. Celiac: Patent without evidence of aneurysm, dissection, vasculitis or significant stenosis. SMA: Patent without evidence of aneurysm, dissection, vasculitis or significant stenosis. Renals: Both renal arteries are patent without evidence of aneurysm, dissection, vasculitis, fibromuscular dysplasia or  significant stenosis. IMA: Patent without evidence of aneurysm, dissection, vasculitis or significant stenosis. Inflow: Patent without evidence of aneurysm, dissection, vasculitis or significant stenosis. Veins: No obvious venous abnormality within the limitations of this arterial phase study. Review of the MIP images confirms the above findings. NON-VASCULAR Hepatobiliary: No focal liver abnormality is seen. No gallstones, gallbladder wall thickening, or biliary dilatation. Pancreas: There is fatty infiltration of the pancreas, unchanged. No acute inflammation. Spleen: Normal in size without focal abnormality. Adrenals/Urinary Tract: There is some scarring in the lower poles of the kidneys. Otherwise, the kidneys, adrenal glands and bladder are within normal limits. Stomach/Bowel: Stomach is within normal limits. no evidence of bowel wall thickening, distention, or inflammatory changes. There is diffuse colonic diverticulosis. The appendix is not seen. There is a large amount of stool in the rectum. Lymphatic: No enlarged lymph nodes are seen. Reproductive: Status post hysterectomy. No adnexal masses. Other: No abdominal wall hernia or abnormality. No abdominopelvic ascites. Musculoskeletal: No acute or significant osseous findings. Review of the MIP images confirms the above findings. IMPRESSION: 1. Unchanged 4.4 cm ascending aortic aneurysm. Recommend annual imaging followup by CTA or MRA. This recommendation follows 2010 ACCF/AHA/AATS/ACR/ASA/SCA/SCAI/SIR/STS/SVM Guidelines for the Diagnosis and Management of Patients with Thoracic Aortic Disease. Circulation. 2010; 121: I696-E952. Aortic aneurysm NOS (ICD10-I71.9) 2. No evidence for aortic dissection. 3. No acute localizing process in the chest, abdomen or pelvis. 4. Stable mild cardiomegaly. 5. 3 mm right solid pulmonary nodule. No routine follow-up imaging is recommended per Fleischner Society Guidelines. These guidelines do not apply to immunocompromised  patients and patients with cancer. Follow up in patients with significant comorbidities as clinically warranted. For lung cancer screening, adhere to Lung-RADS guidelines. Reference: Radiology. 2017; 284(1):228-43. 6. Colonic diverticulosis with large amount of stool in the rectum. 7.  Aortic Atherosclerosis (ICD10-I70.0). Electronically Signed   By: Ronney Asters M.D.   On: 06/30/2021 19:38    EKG: Independently reviewed.  Sinus rhythm at 72 bpm.  PACs noted.  Assessment/Plan Principal Problem:   CHF exacerbation (HCC) Active Problems:   GERD   Essential hypertension   DM (diabetes mellitus) ([place])   Weakness   CHF exacerbation > Patient with prior echo showing diastolic dysfunction in 8413 with EF of 24-40%, grade 1 diastolic dysfunction. > Presenting with a couple months lower extremity edema and shortness of breath.  No new oxygen requirement but is saturating in the upper 80s to low 90s in the ED. BNP is borderline at 139.  Given she is prescribed spironolactone, she may have some degree of diuresis helping him control her diastolic CHF but this may be becoming an adequate. > Also noted to have significantly elevated blood pressure on arrival in the 190s unclear if this is due to volume overload or is itself exacerbating CHF. > Received dose of Lasix in the ED.  Patient clinically appears worse than her initial laboratory evaluation and admission requested for close monitoring during initiation of treatment. - Monitor on telemetry - Echocardiogram - Continue with Lasix 40 mg daily - Strict I's and O's, daily weights - Check magnesium - Trend renal function and electrolytes  Hypertension > Possible degree of hypertensive urgency on arrival with blood pressure in the 190s and evidence of  CHF exacerbation as above.  Blood pressure responded well to IV labetalol in the ED dropping from 147W systolic to 295A-213Y systolic - Continue home hydralazine, spironolactone,  trandolapril  Diabetes > On Lantus 18-20 long-acting daily - 10 units long-acting insulin daily, SSI  Weakness - PT and OT eval and treat  GERD - Continue PPI  Anemia > Hemoglobin at 11.1 which is near her baseline.  Possible component of dilution. - Continue to monitor CBC  DVT prophylaxis: Lovenox Code Status:   Full Family Communication:  Updated at bedside  Disposition Plan:   Patient is from:  Home  Anticipated DC to:  Home  Anticipated DC date:  1 to 2 days  Anticipated DC barriers: None  Consults called:  None Admission status:  Observation, telemetry  Severity of Illness: The appropriate patient status for this patient is OBSERVATION. Observation status is judged to be reasonable and necessary in order to provide the required intensity of service to ensure the patient's safety. The patient's presenting symptoms, physical exam findings, and initial radiographic and laboratory data in the context of their medical condition is felt to place them at decreased risk for further clinical deterioration. Furthermore, it is anticipated that the patient will be medically stable for discharge from the hospital within 2 midnights of admission.    Marcelyn Bruins MD [facility]  How to contact the [facility] Attending or Consulting provider [place] or covering provider during after hours [place], for this patient?   Check the care team in [facility] and look for a) attending/consulting [facility] provider listed and b) the [facility] team listed Log into [email or web address] and use [place]'s universal password to access. If you do not have the password, please contact the hospital operator. Locate the [facility] provider you are looking for under [facility] and page to a number that you can be directly reached. If you still have difficulty reaching the provider, please page the [facility] (Director on Call) for the Hospitalists listed on amion for assistance.  06/30/2021, 9:41 PM

## 2021-06-30 NOTE — ED Notes (Signed)
Pt refuses covid swab.

## 2021-06-30 NOTE — ED Triage Notes (Signed)
BIB [facility] w/ c/o anxiety, hypertension, dizziness, swollen legs.  80 HR  99% RA  122 CBG  18 G LAC  184/90 BP

## 2021-07-01 ENCOUNTER — Observation Stay (HOSPITAL_COMMUNITY): Payer: Medicare PPO

## 2021-07-01 DIAGNOSIS — Z87440 Personal history of urinary (tract) infections: Secondary | ICD-10-CM | POA: Diagnosis not present

## 2021-07-01 DIAGNOSIS — K219 Gastro-esophageal reflux disease without esophagitis: Secondary | ICD-10-CM | POA: Diagnosis present

## 2021-07-01 DIAGNOSIS — I7121 Aneurysm of the ascending aorta, without rupture: Secondary | ICD-10-CM | POA: Diagnosis present

## 2021-07-01 DIAGNOSIS — Z90710 Acquired absence of both cervix and uterus: Secondary | ICD-10-CM | POA: Diagnosis not present

## 2021-07-01 DIAGNOSIS — K589 Irritable bowel syndrome without diarrhea: Secondary | ICD-10-CM | POA: Diagnosis present

## 2021-07-01 DIAGNOSIS — Z888 Allergy status to other drugs, medicaments and biological substances status: Secondary | ICD-10-CM | POA: Diagnosis not present

## 2021-07-01 DIAGNOSIS — Z886 Allergy status to analgesic agent status: Secondary | ICD-10-CM | POA: Diagnosis not present

## 2021-07-01 DIAGNOSIS — D509 Iron deficiency anemia, unspecified: Secondary | ICD-10-CM | POA: Diagnosis present

## 2021-07-01 DIAGNOSIS — E1140 Type 2 diabetes mellitus with diabetic neuropathy, unspecified: Secondary | ICD-10-CM | POA: Diagnosis present

## 2021-07-01 DIAGNOSIS — Z8679 Personal history of other diseases of the circulatory system: Secondary | ICD-10-CM | POA: Diagnosis not present

## 2021-07-01 DIAGNOSIS — I10 Essential (primary) hypertension: Secondary | ICD-10-CM | POA: Diagnosis not present

## 2021-07-01 DIAGNOSIS — M8580 Other specified disorders of bone density and structure, unspecified site: Secondary | ICD-10-CM | POA: Diagnosis present

## 2021-07-01 DIAGNOSIS — F419 Anxiety disorder, unspecified: Secondary | ICD-10-CM | POA: Diagnosis present

## 2021-07-01 DIAGNOSIS — Z86010 Personal history of colonic polyps: Secondary | ICD-10-CM | POA: Diagnosis not present

## 2021-07-01 DIAGNOSIS — I5033 Acute on chronic diastolic (congestive) heart failure: Secondary | ICD-10-CM

## 2021-07-01 DIAGNOSIS — R0602 Shortness of breath: Secondary | ICD-10-CM | POA: Diagnosis present

## 2021-07-01 DIAGNOSIS — R911 Solitary pulmonary nodule: Secondary | ICD-10-CM | POA: Diagnosis present

## 2021-07-01 DIAGNOSIS — E7800 Pure hypercholesterolemia, unspecified: Secondary | ICD-10-CM | POA: Diagnosis present

## 2021-07-01 DIAGNOSIS — Z881 Allergy status to other antibiotic agents status: Secondary | ICD-10-CM | POA: Diagnosis not present

## 2021-07-01 DIAGNOSIS — Z882 Allergy status to sulfonamides status: Secondary | ICD-10-CM | POA: Diagnosis not present

## 2021-07-01 DIAGNOSIS — G250 Essential tremor: Secondary | ICD-10-CM | POA: Diagnosis present

## 2021-07-01 DIAGNOSIS — I160 Hypertensive urgency: Secondary | ICD-10-CM | POA: Diagnosis present

## 2021-07-01 DIAGNOSIS — Z885 Allergy status to narcotic agent status: Secondary | ICD-10-CM | POA: Diagnosis not present

## 2021-07-01 DIAGNOSIS — K2100 Gastro-esophageal reflux disease with esophagitis, without bleeding: Secondary | ICD-10-CM | POA: Diagnosis not present

## 2021-07-01 DIAGNOSIS — Z794 Long term (current) use of insulin: Secondary | ICD-10-CM | POA: Diagnosis not present

## 2021-07-01 DIAGNOSIS — I110 Hypertensive heart disease with heart failure: Secondary | ICD-10-CM | POA: Diagnosis present

## 2021-07-01 DIAGNOSIS — R531 Weakness: Secondary | ICD-10-CM | POA: Diagnosis not present

## 2021-07-01 DIAGNOSIS — Z880 Allergy status to penicillin: Secondary | ICD-10-CM | POA: Diagnosis not present

## 2021-07-01 LAB — CBC
HCT: 36.2 % (ref 36.0–46.0)
Hemoglobin: 11.2 g/dL — ABNORMAL LOW (ref 12.0–15.0)
MCH: 24.7 pg — ABNORMAL LOW (ref 26.0–34.0)
MCHC: 30.9 g/dL (ref 30.0–36.0)
MCV: 79.7 fL — ABNORMAL LOW (ref 80.0–100.0)
Platelets: 227 10*3/uL (ref 150–400)
RBC: 4.54 MIL/uL (ref 3.87–5.11)
RDW: 17 % — ABNORMAL HIGH (ref 11.5–15.5)
WBC: 11.1 10*3/uL — ABNORMAL HIGH (ref 4.0–10.5)
nRBC: 0 % (ref 0.0–0.2)

## 2021-07-01 LAB — COMPREHENSIVE METABOLIC PANEL
ALT: 11 U/L (ref 0–44)
AST: 15 U/L (ref 15–41)
Albumin: 4 g/dL (ref 3.5–5.0)
Alkaline Phosphatase: 49 U/L (ref 38–126)
Anion gap: 8 (ref 5–15)
BUN: 33 mg/dL — ABNORMAL HIGH (ref 8–23)
CO2: 27 mmol/L (ref 22–32)
Calcium: 9.4 mg/dL (ref 8.9–10.3)
Chloride: 106 mmol/L (ref 98–111)
Creatinine, Ser: 0.95 mg/dL (ref 0.44–1.00)
GFR, Estimated: 58 mL/min — ABNORMAL LOW (ref >60)
Glucose, Bld: 98 mg/dL (ref 70–99)
Potassium: 4.1 mmol/L (ref 3.5–5.1)
Sodium: 141 mmol/L (ref 135–145)
Total Bilirubin: 0.5 mg/dL (ref 0.3–1.2)
Total Protein: 7 g/dL (ref 6.5–8.1)

## 2021-07-01 LAB — ECHOCARDIOGRAM COMPLETE
AR max vel: 2.44 cm2
AV Peak grad: 12.3 mmHg
Ao pk vel: 1.75 m/s
Area-P 1/2: 2.63 cm2
Calc EF: 62 %
Height: 63 in
S' Lateral: 2.6 cm
Single Plane A2C EF: 62 %
Single Plane A4C EF: 62.3 %
Weight: 2423.3 oz

## 2021-07-01 LAB — URINE CULTURE: Culture: <10000 — AB

## 2021-07-01 LAB — GLUCOSE, CAPILLARY
Glucose-Capillary: 93 mg/dL (ref 70–99)
Glucose-Capillary: 101 mg/dL — ABNORMAL HIGH (ref 70–99)
Glucose-Capillary: 114 mg/dL — ABNORMAL HIGH (ref 70–99)
Glucose-Capillary: 161 mg/dL — ABNORMAL HIGH (ref 70–99)

## 2021-07-01 NOTE — Plan of Care (Signed)

## 2021-07-01 NOTE — Progress Notes (Signed)
PT Cancellation Note  Patient Details Name: [name] MRN: [id number] DOB: Aug 27, 1932   Cancelled Treatment:    Reason Eval/Treat Not Completed: PT screened, no needs identified, will sign off (per OT, pt is mobilizing at baseline, no PT indicated. Will sign off.)  Philomena Doheny PT 07/01/2021  Acute Rehabilitation Services  Office [phone]

## 2021-07-01 NOTE — TOC Initial Note (Signed)
Transition of Care [facility]) - Initial/Assessment Note    Patient Details  Name: [name] MRN: [id number] Date of Birth: 1932/11/12  Transition of Care [facility]) CM/SW Contact:    Leeroy Cha, RN Phone Number: 07/01/2021, 8:36 AM  Clinical Narrative:                  Transition of Care [facility]) Screening Note   Patient Details  Name: [name] Date of Birth: 07/30/1932   Transition of Care [facility]) CM/SW Contact:    Leeroy Cha, RN Phone Number: 07/01/2021, 8:36 AM    Transition of Care Department [facility]) has reviewed patient and no TOC needs have been identified at this time. We will continue to monitor patient advancement through interdisciplinary progression rounds. If new patient transition needs arise, please place a TOC consult.    Expected Discharge Plan: Home/Self Care Barriers to Discharge: No Barriers Identified   Patient Goals and CMS Choice Patient states their goals for this hospitalization and ongoing recovery are:: to go home CMS Medicare.gov Compare Post Acute Care list provided to:: Patient Choice offered to / list presented to : Patient  Expected Discharge Plan and Services Expected Discharge Plan: Home/Self Care   Discharge Planning Services: CM Consult   Living arrangements for the past 2 months: Single Family Home                                      Prior Living Arrangements/Services Living arrangements for the past 2 months: Single Family Home Lives with:: Spouse Patient language and need for interpreter reviewed:: Yes Do you feel safe going back to the place where you live?: Yes            Criminal Activity/Legal Involvement Pertinent to Current Situation/Hospitalization: No - Comment as needed  Activities of Daily Living Home Assistive Devices/Equipment: Walker (specify type), Cane (specify quad or straight), Eyeglasses (reading glasses) ADL Screening (condition at time of admission) Patient's cognitive ability  adequate to safely complete daily activities?: Yes Is the patient deaf or have difficulty hearing?: Yes Does the patient have difficulty seeing, even when wearing glasses/contacts?: No (hx lens implants bilaterally) Does the patient have difficulty concentrating, remembering, or making decisions?: No Patient able to express need for assistance with ADLs?: Yes Does the patient have difficulty dressing or bathing?: No Independently performs ADLs?: Yes (appropriate for developmental age) Does the patient have difficulty walking or climbing stairs?: No Weakness of Legs: Both Weakness of Arms/Hands: Both  Permission Sought/Granted                  Emotional Assessment Appearance:: Appears stated age Attitude/Demeanor/Rapport: Engaged Affect (typically observed): Calm Orientation: : Oriented to Self, Oriented to Place, Oriented to  Time, Oriented to Situation Alcohol / Substance Use: Not Applicable Psych Involvement: No (comment)  Admission diagnosis:  Leg swelling [M79.89] CHF exacerbation (HCC) [I50.9] Hypertensive urgency [I16.0] Patient Active Problem List   Diagnosis Date Noted   CHF exacerbation ([place]) 06/30/2021   Weakness 06/30/2021   Ankle edema, bilateral 01/21/2021   Sprain of left rotator cuff capsule 01/21/2021   Cervical radiculopathy due to degenerative joint disease of spine 01/21/2021   Left wrist pain 08/06/2019   Bicuspid aortic valve    Constipation 11/14/2017   Rectal bleeding 11/14/2017   Carotid stenosis    Heart palpitations 03/23/2017   Ascending aortic aneurysm ([place]) 12/07/2015   SOB (  shortness of breath) 12/07/2015   Orthostatic hypotension 04/02/2014   Hematochezia 04/02/2014   Pain in the chest    Near syncope 04/01/2014   DM (diabetes mellitus) ([place]) 04/01/2014   Diverticulosis 04/01/2014   Unruptured cerebral aneurysm 01/08/2014   Multifactorial gait disorder 01/08/2014   Hereditary and idiopathic peripheral neuropathy 01/08/2014    Aneurysm, cerebral, nonruptured 08/07/2012   Amaurosis fugax 08/07/2012   Unspecified hereditary and idiopathic peripheral neuropathy 08/07/2012   Benign essential tremor syndrome    Abnormal x-ray of lumbar spine 08/29/2010   Low back pain 08/24/2010   Anxiety state 03/26/2010   Essential hypertension 03/26/2010   Abdominal pain, bilateral lower quadrant 03/26/2010   GERD 12/22/2008   IRON DEFICIENCY 05/22/2008   Irritable bowel syndrome 04/07/2008   COLONIC POLYPS, ADENOMATOUS, HX OF 04/07/2008   PCP:  Antony Contras, MD Pharmacy:   [id number] [place] [address] [place] [address]Phone: [phone]: [phone]    Social Determinants of Health (SDOH) Interventions    Readmission Risk Interventions     View : No data to display.

## 2021-07-01 NOTE — Progress Notes (Signed)
PROGRESS NOTE    [name]  TDV:[id number] DOB: February 10, 1932 DOA: 06/30/2021 PCP: Antony Contras, MD   Brief Narrative:  HPI per Dr. Neva Seat on 06/30/21 [name] is a 88 y.o. female with medical history significant of GERD, IBS, anxiety, hypertension, low back pain, diabetes, hyperlipidemia, essential tremor, cerebral aneurysm, aortic aneurysm, neuropathy, diverticulosis, carotid artery disease, cervical radiculopathy presenting with shortness of breath, edema, dizziness.  Patient states she has had new onset lower extremity edema for the past 2 months with some associated shortness of breath for the past 6 months, worse for the past 2 months.  Shortness of breath does wax and wane.  Also reports feeling dizzy, lightheaded on standing..  She has had prior echocardiogram which showed some diastolic dysfunction.  She reports she is a sole caregiver for her husband is 62 year old.  She is also reporting generalized weakness recently for at least a couple months.   She denies fevers, chills, chest pain, abdominal pain, constipation, diarrhea, nausea, vomiting.   ED Course: Vital signs in the ED significant for initial blood pressure in the 062I systolic now improved to the 948N to 462V systolic.  Lab work-up included CMP with BUN 33, glucose 163.  CBC with hemoglobin of 11.1 from baseline around 11-12.  Troponin normal with repeat pending.  BNP elevated to 139.  Rest were panel for flu and COVID not collected due to patient's concern about nasal polyps.  Urinalysis and urine culture pending.  Chest x-ray without acute abnormality.  CT head showed no acute normality.  CT of the chest abdomen pelvis showed stable aortic aneurysm without dissection and no acute abnormality of the chest abdomen or pelvis.  Did show 3 mm right pulmonary nodule with no follow-up recommended.  Patient received Lasix, labetalol, albuterol in the ED.  **Interim History Patient is currently being diuresed and  she is improving.  PT OT evaluated and recommending no follow-up.  She is currently awaiting an echocardiogram to be done.  We will continue diuresis and likely transition to oral Lasix in the morning as well as continuing her spironolactone with close renal monitoring.   Assessment and Plan:  Acute on chronic diastolic CHF exacerbation > Patient with prior echo showing diastolic dysfunction in 0350 with EF of 09-38%, grade 1 diastolic dysfunction. -Presenting with a couple months lower extremity edema and shortness of breath.   -No new oxygen requirement but is saturating in the upper 80s to low 90s in the ED. BNP is borderline at 139.   -Given she is prescribed spironolactone, she may have some degree of diuresis helping him control her diastolic CHF but this may be becoming an adequate. -Also noted to have significantly elevated blood pressure on arrival in the 190s unclear if this is due to volume overload or is itself exacerbating CHF. -Received dose of Lasix in the ED.  Patient clinically appears worse than her initial laboratory evaluation and admission requested for close monitoring during initiation of treatment. -Continue to Monitor on telemetry - Echocardiogram ordered and pending to be done - Continue with Lasix 40 mg daily and will likely change to p.o. Lasix 20 mg for 40 mg p.o. daily - Strict I's and O's, daily weights; she is -1.32 liters since admission - Check magnesium - Trend renal function and electrolytes -PT OT evaluated and recommending no follow-up -If is not improving or slow to improve we will consult cardiology for further evaluation but she sees Dr. Graylin Shiver in the outpatient setting and  will likely need to follow-up with her in outpatient setting -She will need an ambulatory home O2 screen prior to discharge  Dyspnea on Exertion -CXR showed "Lungs are clear.  No pleural effusion or pneumothorax. The heart is normal in size. Mild degenerative changes of the  visualized thoracolumbar spine"  -CTA Chest/Abd/Pelvis showed "No acute localizing process in the chest, abdomen or pelvis. Stable mild cardiomegaly. 3 mm right solid pulmonary nodule."  Pulmonary Nodule -3 mm Right Solid Pulmonary Nodule can be followed up in the outpatient setting  AAA -CTA showed "Unchanged 4.4 cm ascending aortic aneurysm. -Recommend annual imaging followup by CTA or MRA. No evidence for aortic dissection.  Hypertension with hypertensive urgency -Possible degre e of hypertensive urgency on arrival with blood pressure in the 190s and evidence of CHF exacerbation as above.  Blood pressure responded well to IV labetalol in the ED dropping from 250N systolic to 397Q-734L systolic -Continue home hydralazine, spironolactone, trandolapril -Continue monitor blood pressures per protocol -Last blood pressure reading is 145/59  Diabetes mellitus type II > On Lantus 18-20 long-acting daily - 10 units long-acting insulin daily, SSI -CBGs ranging from 93-116   Generalized weakness - PT and OT eval and treat and they recommend no follow-up  GERD/GI Prophylaxis - Continue PPI with Pantoprazole 40 mg po Daily   Normocytic Anemia/microcytic anemia -Hemoglobin at 11.1 which is near her baseline.  Possible component of dilution. -Now patient's hemoglobin/hematocrit gone from 12.6/37.0 is now 11.2/36.2 and MCV of 79.7 -Check anemia panel in the a.m. -Continue to monitor for signs and symptoms of bleeding; currently no overt bleeding noted -Repeat CBC in a.m.  DVT prophylaxis: enoxaparin (LOVENOX) injection 40 mg Start: 06/30/21 2200    Code Status: Full Code Family Communication: No family present at bedside   Disposition Plan:  Level of care: Telemetry Status is: Inpatient Remains inpatient appropriate because: She is still little dyspneic on exertion but she is improving.  She is diuresing well and echocardiogram needs to be done prior to be discharged   Consultants:   None  Procedures:  ECHO ordered and pending to be done  Antimicrobials:  Anti-infectives (From admission, onward)    None       Subjective: Seen and examined at bedside and thinks she is doing a little bit better today than she was yesterday.  States that she is able to breathe a little bit easier and not get short of breath.  No nausea or vomiting.  Had a bowel movement this morning.  States that she has been urinating frequently.  Denies any lightheadedness or dizziness.  No other concerns or complaints this time.  Objective: Vitals:   07/01/21 0217 07/01/21 0656 07/01/21 1034 07/01/21 1221  BP: (!) 147/64 (!) 125/54 (!) 144/56 (!) 145/59  Pulse: 62 (!) 58 (!) 55 70  Resp: 17 17 (!) 22 (!) 24  Temp: 97.9 F (36.6 C) 98.5 F (36.9 C) (!) 97.5 F (36.4 C) 98.1 F (36.7 C)  TempSrc:   Oral Oral  SpO2: 95% 95% 97% 94%  Weight:      Height:        Intake/Output Summary (Last 24 hours) at 07/01/2021 1443 Last data filed at 07/01/2021 1214 Gross per 24 hour  Intake 780 ml  Output 2100 ml  Net -1320 ml   Filed Weights   06/30/21 2209  Weight: 68.7 kg   Examination: Physical Exam:  Constitutional: WN/WD elderly overweight Caucasian female currently no acute distress Respiratory: Diminished to auscultation bilaterally  with coarse breath sounds and some slight crackles, no wheezing, rales, rhonchi. Normal respiratory effort and patient is not tachypenic. No accessory muscle use.  Unlabored breathing and not wearing supplemental oxygen via nasal cannula Cardiovascular: RRR, no murmurs / rubs / gallops. S1 and S2 auscultated.  Mild 1+ extremity edema Abdomen: Soft, non-tender, slightly distended second by habitus. Bowel sounds positive.  GU: Deferred. Musculoskeletal: No clubbing / cyanosis of digits/nails. No joint deformity upper and lower extremities.  Neurologic: CN 2-12 grossly intact with no focal deficits. Romberg sign and cerebellar reflexes not assessed.  Psychiatric:  Normal judgment and insight. Alert and oriented x 3. Normal mood and appropriate affect.   Data Reviewed: I have personally reviewed following labs and imaging studies  CBC: Recent Labs  Lab 06/30/21 1806 06/30/21 1848 07/01/21 0432  WBC 9.7  --  11.1*  NEUTROABS 5.4  --   --   HGB 11.1* 12.6 11.2*  HCT 35.5* 37.0 36.2  MCV 80.3  --  79.7*  PLT 224  --  161   Basic Metabolic Panel: Recent Labs  Lab 06/30/21 1806 06/30/21 1848 06/30/21 2110 07/01/21 0432  NA 140 139  --  141  K 4.4 4.3  --  4.1  CL 108 105  --  106  CO2 25  --   --  27  GLUCOSE 163* 155*  --  98  BUN 33* 32*  --  33*  CREATININE 0.85 0.90  --  0.95  CALCIUM 9.6  --   --  9.4  MG  --   --  1.8  --    GFR: Estimated Creatinine Clearance: 39.5 mL/min (by C-G formula based on SCr of 0.95 mg/dL). Liver Function Tests: Recent Labs  Lab 06/30/21 1806 07/01/21 0432  AST 17 15  ALT 11 11  ALKPHOS 56 49  BILITOT 0.4 0.5  PROT 7.1 7.0  ALBUMIN 3.9 4.0   No results for input(s): LIPASE, AMYLASE in the last 168 hours. No results for input(s): AMMONIA in the last 168 hours. Coagulation Profile: No results for input(s): INR, PROTIME in the last 168 hours. Cardiac Enzymes: No results for input(s): CKTOTAL, CKMB, CKMBINDEX, TROPONINI in the last 168 hours. BNP (last 3 results) No results for input(s): PROBNP in the last 8760 hours. HbA1C: No results for input(s): HGBA1C in the last 72 hours. CBG: Recent Labs  Lab 06/30/21 2241 07/01/21 0733 07/01/21 1154  GLUCAP 116* 93 101*   Lipid Profile: No results for input(s): CHOL, HDL, LDLCALC, TRIG, CHOLHDL, LDLDIRECT in the last 72 hours. Thyroid Function Tests: No results for input(s): TSH, T4TOTAL, FREET4, T3FREE, THYROIDAB in the last 72 hours. Anemia Panel: No results for input(s): VITAMINB12, FOLATE, FERRITIN, TIBC, IRON, RETICCTPCT in the last 72 hours. Sepsis Labs: No results for input(s): PROCALCITON, LATICACIDVEN in the last 168 hours.  No  results found for this or any previous visit (from the past 240 hour(s)).   Radiology Studies: DG Chest 2 View  Result Date: 06/30/2021 CLINICAL DATA:  Chest pain EXAM: CHEST - 2 VIEW COMPARISON:  CTA chest dated 09/18/2020 FINDINGS: Lungs are clear.  No pleural effusion or pneumothorax. The heart is normal in size. Mild degenerative changes of the visualized thoracolumbar spine. IMPRESSION: Normal chest radiographs. Electronically Signed   By: Julian Hy M.D.   On: 06/30/2021 18:33   CT HEAD WO CONTRAST (5MM)  Result Date: 06/30/2021 CLINICAL DATA:  Dizziness, headache, hypertension EXAM: CT HEAD WITHOUT CONTRAST TECHNIQUE: Contiguous axial images were obtained  from the base of the skull through the vertex without intravenous contrast. RADIATION DOSE REDUCTION: This exam was performed according to the departmental dose-optimization program which includes automated exposure control, adjustment of the mA and/or kV according to patient size and/or use of iterative reconstruction technique. COMPARISON:  05/04/2020 FINDINGS: Brain: No acute infarct or hemorrhage. Lateral ventricles and midline structures are unremarkable. No acute extra-axial fluid collections. No mass effect. Age-appropriate cerebral atrophy. Vascular: Aneurysm coil at the right cavernous sinus unchanged. No hyperdense vessel. Skull: Normal. Negative for fracture or focal lesion. Sinuses/Orbits: No acute finding. Other: None. IMPRESSION: 1. No acute intracranial process. Electronically Signed   By: Randa Ngo M.D.   On: 06/30/2021 19:24   CT Angio Chest/Abd/Pel for Dissection W and/or Wo Contrast  Result Date: 06/30/2021 CLINICAL DATA:  Hypertension and dizziness. EXAM: CT ANGIOGRAPHY CHEST, ABDOMEN AND PELVIS TECHNIQUE: Noncontrast chest CT was performed. Multidetector CT imaging through the chest, abdomen and pelvis was performed using the standard protocol during bolus administration of intravenous contrast. Multiplanar  reconstructed images and MIPs were obtained and reviewed to evaluate the vascular anatomy. RADIATION DOSE REDUCTION: This exam was performed according to the departmental dose-optimization program which includes automated exposure control, adjustment of the mA and/or kV according to patient size and/or use of iterative reconstruction technique. CONTRAST:  144m OMNIPAQUE IOHEXOL 350 MG/ML SOLN COMPARISON:  CT abdomen and pelvis 12/27/2014.CT angiogram chest 09/18/2020. FINDINGS: CTA CHEST FINDINGS Cardiovascular: Preferential opacification of the thoracic aorta. There is stable aneurysmal dilatation of the ascending thoracic aorta measuring up to 4.4 cm, unchanged from the prior examination. No evidence for dissection. There are atherosclerotic calcifications of the aorta. No central pulmonary embolism identified. Heart is mildly enlarged, unchanged. No pericardial effusion. Mediastinum/Nodes: No enlarged mediastinal, hilar, or axillary lymph nodes. Thyroid gland, trachea, and esophagus demonstrate no significant findings. Lungs/Pleura: There is some scarring in the lung apices. There is a 3 mm nodule in the right lower lobe image 5/79 which is new from prior. There is no focal lung infiltrate, pleural effusion, or pneumothorax. Musculoskeletal: No chest wall abnormality. No acute or significant osseous findings. Review of the MIP images confirms the above findings. CTA ABDOMEN AND PELVIS FINDINGS VASCULAR Aorta: Normal caliber aorta without aneurysm, dissection, vasculitis or significant stenosis. There are mild atherosclerotic calcifications of the aorta. Celiac: Patent without evidence of aneurysm, dissection, vasculitis or significant stenosis. SMA: Patent without evidence of aneurysm, dissection, vasculitis or significant stenosis. Renals: Both renal arteries are patent without evidence of aneurysm, dissection, vasculitis, fibromuscular dysplasia or significant stenosis. IMA: Patent without evidence of  aneurysm, dissection, vasculitis or significant stenosis. Inflow: Patent without evidence of aneurysm, dissection, vasculitis or significant stenosis. Veins: No obvious venous abnormality within the limitations of this arterial phase study. Review of the MIP images confirms the above findings. NON-VASCULAR Hepatobiliary: No focal liver abnormality is seen. No gallstones, gallbladder wall thickening, or biliary dilatation. Pancreas: There is fatty infiltration of the pancreas, unchanged. No acute inflammation. Spleen: Normal in size without focal abnormality. Adrenals/Urinary Tract: There is some scarring in the lower poles of the kidneys. Otherwise, the kidneys, adrenal glands and bladder are within normal limits. Stomach/Bowel: Stomach is within normal limits. no evidence of bowel wall thickening, distention, or inflammatory changes. There is diffuse colonic diverticulosis. The appendix is not seen. There is a large amount of stool in the rectum. Lymphatic: No enlarged lymph nodes are seen. Reproductive: Status post hysterectomy. No adnexal masses. Other: No abdominal wall hernia or abnormality. No abdominopelvic ascites.  Musculoskeletal: No acute or significant osseous findings. Review of the MIP images confirms the above findings. IMPRESSION: 1. Unchanged 4.4 cm ascending aortic aneurysm. Recommend annual imaging followup by CTA or MRA. This recommendation follows 2010 ACCF/AHA/AATS/ACR/ASA/SCA/SCAI/SIR/STS/SVM Guidelines for the Diagnosis and Management of Patients with Thoracic Aortic Disease. Circulation. 2010; 121: D638-V564. Aortic aneurysm NOS (ICD10-I71.9) 2. No evidence for aortic dissection. 3. No acute localizing process in the chest, abdomen or pelvis. 4. Stable mild cardiomegaly. 5. 3 mm right solid pulmonary nodule. No routine follow-up imaging is recommended per Fleischner Society Guidelines. These guidelines do not apply to immunocompromised patients and patients with cancer. Follow up in patients  with significant comorbidities as clinically warranted. For lung cancer screening, adhere to Lung-RADS guidelines. Reference: Radiology. 2017; 284(1):228-43. 6. Colonic diverticulosis with large amount of stool in the rectum. 7.  Aortic Atherosclerosis (ICD10-I70.0). Electronically Signed   By: Ronney Asters M.D.   On: 06/30/2021 19:38     Scheduled Meds:  enoxaparin (LOVENOX) injection  40 mg Subcutaneous Q24H   furosemide  40 mg Intravenous Daily   hydrALAZINE  10 mg Oral TID   insulin aspart  0-9 Units Subcutaneous TID WC   insulin glargine-yfgn  10 Units Subcutaneous Daily   pantoprazole  40 mg Oral Daily   sodium chloride flush  3 mL Intravenous Q12H   spironolactone  12.5 mg Oral Daily   trandolapril  4 mg Oral BID   Continuous Infusions:   LOS: 0 days   Raiford Noble, DO [facility] Available via Epic secure chat 7am-7pm After these hours, please refer to coverage provider listed on [email or web address] 07/01/2021, 2:43 PM

## 2021-07-01 NOTE — Evaluation (Signed)
Occupational Therapy Evaluation Patient Details Name: [name] MRN: [id number] DOB: 02-08-1932 Today's Date: 07/01/2021   History of Present Illness [name] is a 88 y.o. female with medical history significant of GERD, IBS, anxiety, hypertension, low back pain, diabetes, hyperlipidemia, essential tremor, cerebral aneurysm, aortic aneurysm, neuropathy, diverticulosis, carotid artery disease, cervical radiculopathy presenting with shortness of breath, edema, dizziness   Clinical Impression   [name] is an 88 year old woman who demonstrates the ability to perform bed transfers, ambulation with walker, toilet transfer and ADLs without assistance. She has no complaints of fatigue or shortness of breathe. She mentions mild dizziness with getting up but reports this happens at home as well. Patient educated on safety with sit to stand and not immediately "taking off." Patient at baseline. No further needs.     Recommendations for follow up therapy are one component of a multi-disciplinary discharge planning process, led by the attending physician.  Recommendations may be updated based on patient status, additional functional criteria and insurance authorization.   Follow Up Recommendations  No OT follow up    Assistance Recommended at Discharge None  Patient can return home with the following      Functional Status Assessment  Patient has not had a recent decline in their functional status  Equipment Recommendations  None recommended by OT    Recommendations for Other Services       Precautions / Restrictions Precautions Precautions: Fall Precaution Comments: hx of dizziness Restrictions Weight Bearing Restrictions: No      Mobility Bed Mobility Overal bed mobility: Independent                  Transfers Overall transfer level: Modified independent Equipment used: Rolling walker (2 wheels)               General transfer comment: Use of RW  for in room ambulation. Mild complaints of dzziness with sitting up. No assistance needed to ambulate in room, perform toilet transfer or to get in and out of bed.      Balance Overall balance assessment: Mild deficits observed, not formally tested                                         ADL either performed or assessed with clinical judgement   ADL Overall ADL's : Modified independent                                       General ADL Comments: Use of walker with functional mobility, no assistance required for donning/doffing socks or toileting or bathing.     Vision Patient Visual Report: No change from baseline       Perception     Praxis      Pertinent Vitals/Pain Pain Assessment Pain Assessment: No/denies pain     Hand Dominance Right   Extremity/Trunk Assessment Upper Extremity Assessment Upper Extremity Assessment: Overall WFL for tasks assessed   Lower Extremity Assessment Lower Extremity Assessment: Overall WFL for tasks assessed   Cervical / Trunk Assessment Cervical / Trunk Assessment: Normal   Communication Communication Communication: No difficulties   Cognition Arousal/Alertness: Awake/alert Behavior During Therapy: WFL for tasks assessed/performed Overall Cognitive Status: Within Functional Limits for tasks assessed  General Comments       Exercises     Shoulder Instructions      Home Living Family/patient expects to be discharged to:: Private residence Living Arrangements: Spouse/significant other Available Help at Discharge: Family;Personal care attendant;Available 24 hours/day Type of Home: House Home Access: Level entry     Home Layout: One level     Bathroom Shower/Tub: Teacher, early years/pre: Standard     Home Equipment: Conservation officer, nature (2 wheels);Cane - single point;Tub bench;Grab bars - tub/shower          Prior  Functioning/Environment Prior Level of Function : Independent/Modified Independent             Mobility Comments: use c ane in the house, walker in the community ADLs Comments: independent with ADLs, sponge baths, children help with ADLs        OT Problem List: Cardiopulmonary status limiting activity      OT Treatment/Interventions:      OT Goals(Current goals can be found in the care plan section) Acute Rehab OT Goals OT Goal Formulation: All assessment and education complete, DC therapy  OT Frequency:      Co-evaluation              AM-PAC OT "6 Clicks" Daily Activity     Outcome Measure Help from another person eating meals?: None Help from another person taking care of personal grooming?: None Help from another person toileting, which includes using toliet, bedpan, or urinal?: None Help from another person bathing (including washing, rinsing, drying)?: None Help from another person to put on and taking off regular upper body clothing?: None Help from another person to put on and taking off regular lower body clothing?: None 6 Click Score: 24   End of Session Equipment Utilized During Treatment: Rolling walker (2 wheels) Nurse Communication: Mobility status  Activity Tolerance: Patient tolerated treatment well Patient left: in bed;with call bell/phone within reach;with bed alarm set  OT Visit Diagnosis: Muscle weakness (generalized) (M62.81)                Time: 1245-8099 OT Time Calculation (min): 12 min Charges:  OT General Charges $OT Visit: 1 Visit OT Evaluation $OT Eval Low Complexity: 1 Low  Akashdeep Chuba, OTR/L [place]  Office [phone] Pager: [phone]   Lenward Chancellor 07/01/2021, 11:22 AM

## 2021-07-01 NOTE — TOC Progression Note (Addendum)
Transition of Care [facility]) - Progression Note    Patient Details  Name: [name] MRN: [id number] Date of Birth: 07/15/1932  [facility] CM/SW Contact  Leeroy Cha, RN Phone Number: 07/01/2021, 8:39 AM  Clinical Narrative:    Home heart failure screening sent to [facility]. [facility] accepted   Expected Discharge Plan: Home/Self Care Barriers to Discharge: No Barriers Identified  Expected Discharge Plan and Services Expected Discharge Plan: Home/Self Care   Discharge Planning Services: CM Consult   Living arrangements for the past 2 months: Single Family Home                                       Social Determinants of Health (SDOH) Interventions    Readmission Risk Interventions     View : No data to display.

## 2021-07-02 ENCOUNTER — Inpatient Hospital Stay (HOSPITAL_COMMUNITY): Payer: Medicare PPO

## 2021-07-02 LAB — CBC WITH DIFFERENTIAL/PLATELET
Abs Immature Granulocytes: 0.02 10*3/uL (ref 0.00–0.07)
Basophils Absolute: 0.1 10*3/uL (ref 0.0–0.1)
Basophils Relative: 1 %
Eosinophils Absolute: 0.2 10*3/uL (ref 0.0–0.5)
Eosinophils Relative: 2 %
HCT: 36.8 % (ref 36.0–46.0)
Hemoglobin: 11.5 g/dL — ABNORMAL LOW (ref 12.0–15.0)
Immature Granulocytes: 0 %
Lymphocytes Relative: 51 %
Lymphs Abs: 5.1 10*3/uL — ABNORMAL HIGH (ref 0.7–4.0)
MCH: 24.7 pg — ABNORMAL LOW (ref 26.0–34.0)
MCHC: 31.3 g/dL (ref 30.0–36.0)
MCV: 79 fL — ABNORMAL LOW (ref 80.0–100.0)
Monocytes Absolute: 0.7 10*3/uL (ref 0.1–1.0)
Monocytes Relative: 7 %
Neutro Abs: 3.8 10*3/uL (ref 1.7–7.7)
Neutrophils Relative %: 39 %
Platelets: 249 10*3/uL (ref 150–400)
RBC: 4.66 MIL/uL (ref 3.87–5.11)
RDW: 16.9 % — ABNORMAL HIGH (ref 11.5–15.5)
WBC: 9.9 10*3/uL (ref 4.0–10.5)
nRBC: 0 % (ref 0.0–0.2)

## 2021-07-02 LAB — COMPREHENSIVE METABOLIC PANEL
ALT: 12 U/L (ref 0–44)
AST: 18 U/L (ref 15–41)
Albumin: 4 g/dL (ref 3.5–5.0)
Alkaline Phosphatase: 49 U/L (ref 38–126)
Anion gap: 10 (ref 5–15)
BUN: 40 mg/dL — ABNORMAL HIGH (ref 8–23)
CO2: 27 mmol/L (ref 22–32)
Calcium: 9.6 mg/dL (ref 8.9–10.3)
Chloride: 106 mmol/L (ref 98–111)
Creatinine, Ser: 1.19 mg/dL — ABNORMAL HIGH (ref 0.44–1.00)
GFR, Estimated: 45 mL/min — ABNORMAL LOW (ref >60)
Glucose, Bld: 119 mg/dL — ABNORMAL HIGH (ref 70–99)
Potassium: 4.1 mmol/L (ref 3.5–5.1)
Sodium: 143 mmol/L (ref 135–145)
Total Bilirubin: 0.5 mg/dL (ref 0.3–1.2)
Total Protein: 7.3 g/dL (ref 6.5–8.1)

## 2021-07-02 LAB — PHOSPHORUS: Phosphorus: 4.7 mg/dL — ABNORMAL HIGH (ref 2.5–4.6)

## 2021-07-02 LAB — MAGNESIUM: Magnesium: 2.2 mg/dL (ref 1.7–2.4)

## 2021-07-02 LAB — GLUCOSE, CAPILLARY
Glucose-Capillary: 117 mg/dL — ABNORMAL HIGH (ref 70–99)
Glucose-Capillary: 103 mg/dL — ABNORMAL HIGH (ref 70–99)

## 2021-07-02 MED ORDER — FUROSEMIDE 20 MG PO TABS: 20.0000 mg | ORAL_TABLET | Freq: Every day | ORAL | 0 refills | Status: DC | PRN

## 2021-07-02 MED ORDER — POLYETHYLENE GLYCOL 3350 17 G PO PACK: 17.0000 g | PACK | Freq: Every day | ORAL | 0 refills | Status: DC | PRN

## 2021-07-02 MED ORDER — FUROSEMIDE 20 MG PO TABS: 20.0000 mg | ORAL_TABLET | Freq: Every day | ORAL | Status: DC

## 2021-07-02 NOTE — Discharge Summary (Signed)
Physician Discharge Summary   Patient: MARRIANNE Black MRN: [id number] DOB: 1932/05/02  Admit date:     06/30/2021  Discharge date: 07/02/21  Discharge Physician: Raiford Noble, DO   PCP: Antony Contras, MD   Recommendations at discharge:   Follow up with PCP with in 1-2 weeks and repeat CBC, CMP, Mag, Phos within 1 week Follow up with Cardiology Dr. Radford Pax within 1-2 weeks and appointment scheduled with Richardson Dopp, PA-C on 07/13/21 at 10:55 AM  Repeat CXR within 1 week Follow-up in outpatient setting for her AAA Follow-up with 3 mm solid nodule noted on the chest x-ray  Discharge Diagnoses: Principal Problem:   CHF exacerbation ([place]) Active Problems:   GERD   Essential hypertension   DM (diabetes mellitus) ([place])   Weakness  Resolved Problems:   * No resolved hospital problems. [facility] Course: HPI per Dr. Neva Seat on 06/30/21 [name] is a 88 y.o. female with medical history significant of GERD, IBS, anxiety, hypertension, low back pain, diabetes, hyperlipidemia, essential tremor, cerebral aneurysm, aortic aneurysm, neuropathy, diverticulosis, carotid artery disease, cervical radiculopathy presenting with shortness of breath, edema, dizziness.  Patient states she has had new onset lower extremity edema for the past 2 months with some associated shortness of breath for the past 6 months, worse for the past 2 months.  Shortness of breath does wax and wane.  Also reports feeling dizzy, lightheaded on standing..  She has had prior echocardiogram which showed some diastolic dysfunction.  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  She is also reporting generalized weakness recently for at least a couple months.   She denies fevers, chills, chest pain, abdominal pain, constipation, diarrhea, nausea, vomiting.   ED Course: Vital signs in the ED significant for initial blood pressure in the 673A systolic now improved to the 193X to 902I systolic.   Lab work-up included CMP with BUN 33, glucose 163.  CBC with hemoglobin of 11.1 from baseline around 11-12.  Troponin normal with repeat pending.  BNP elevated to 139.  Rest were panel for flu and COVID not collected due to patient's concern about nasal polyps.  Urinalysis and urine culture pending.  Chest x-ray without acute abnormality.  CT head showed no acute normality.  CT of the chest abdomen pelvis showed stable aortic aneurysm without dissection and no acute abnormality of the chest abdomen or pelvis.  Did show 3 mm right pulmonary nodule with no follow-up recommended.  Patient received Lasix, labetalol, albuterol in the ED.   **Interim History Patient is currently being diuresed and she is improving.  PT OT evaluated and recommending no follow-up.  She is currently awaiting an echocardiogram to be done.  She was given diuresis and improved and was transitioned to oral Lasix.  Renal function slightly trended up so her IV diuresis was stopped and she was placed on p.o. 20 mg daily as needed and she has an appointment with cardiology outpatient setting.  Repeat echo showed EF of 60 to 09% with no diastolic dysfunction on this echo which showed improvement.  Assessment and Plan:  Acute on chronic diastolic CHF exacerbation > Patient with prior echo showing diastolic dysfunction in 7353 with EF of 29-92%, grade 1 diastolic dysfunction.;  Repeat echo showed EF of 60 to 65% with normal diastolic function now -Presenting with a couple months lower extremity edema and shortness of breath.   -No new oxygen requirement but is saturating in the upper 80s to  low 90s in the ED. BNP is borderline at 139.   -Given she is prescribed spironolactone, she may have some degree of diuresis helping him control her diastolic CHF but this may be becoming an adequate. -Also noted to have significantly elevated blood pressure on arrival in the 190s unclear if this is due to volume overload or is itself exacerbating  CHF. -Received dose of Lasix in the ED.  Patient clinically appears worse than her initial laboratory evaluation and admission requested for close monitoring during initiation of treatment. -Continue to Monitor on telemetry - Echocardiogram ordered and pending to be done - Continue with Lasix 40 mg daily and will likely change to p.o. Lasix 20 mg daily as needed - Strict I's and O's, daily weights; she is -1.22 liters since admission - Check magnesium - Trend renal function and electrolytes -PT OT evaluated and recommending no follow-up -If is not improving or slow to improve we will consult cardiology for further evaluation but she sees Dr. Graylin Shiver in the outpatient setting and will likely need to follow-up with her in outpatient setting -She will need an ambulatory home O2 screen prior to discharge and she did not desaturate and she is stable for discharge   Dyspnea on Exertion -CXR showed "Lungs are clear.  No pleural effusion or pneumothorax. The heart is normal in size. Mild degenerative changes of the visualized thoracolumbar spine"  -CTA Chest/Abd/Pelvis showed "No acute localizing process in the chest, abdomen or pelvis. Stable mild cardiomegaly. 3 mm right solid pulmonary nodule."   Pulmonary Nodule -3 mm Right Solid Pulmonary Nodule can be followed up in the outpatient setting with PCP or referral to pulmonary   AAA -CTA showed "Unchanged 4.4 cm ascending aortic aneurysm. -Recommend annual imaging followup by CTA or MRA. No evidence for aortic dissection.  Hypertension with hypertensive urgency -Possible degre e of hypertensive urgency on arrival with blood pressure in the 190s and evidence of CHF exacerbation as above.  Blood pressure responded well to IV labetalol in the ED dropping from 914N systolic to 829F-621H systolic -Continue home hydralazine, spironolactone, trandolapril -Continue monitor blood pressures per protocol -Last blood pressure reading is stable at  118/59  Diabetes mellitus type II > On Lantus 18-20 long-acting daily - 10 units long-acting insulin daily, SSI -CBGs ranging from 103-161   Generalized weakness - PT and OT eval and treat and they recommend no follow-up  GERD/GI Prophylaxis - Continue PPI with Pantoprazole 40 mg po Daily   Normocytic Anemia/microcytic anemia -Hemoglobin at 11.1 which is near her baseline.  Possible component of dilution. -Now patient's hemoglobin/hematocrit is relatively stable at 11.5/36.8 with an MCV of 79 point -Check anemia panel in the outpatient setting -Continue to monitor for signs and symptoms of bleeding; currently no overt bleeding noted -Repeat CBC within 1  Consultants: None  Procedures performed: ECHO  IMPRESSIONS     1. Left ventricular ejection fraction, by estimation, is 60 to 65%. The  left ventricle has normal function. The left ventricle has no regional  wall motion abnormalities. There is mild concentric left ventricular  hypertrophy. Left ventricular diastolic  parameters were normal.   2. Right ventricular systolic function is normal. The right ventricular  size is normal.   3. The mitral valve is normal in structure. Trivial mitral valve  regurgitation. No evidence of mitral stenosis.   4. The aortic valve is tricuspid. Aortic valve regurgitation is trivial.  No aortic stenosis is present.   5. There is borderline dilatation  of the aortic root, measuring 37 mm.  There is moderate dilatation of the ascending aorta, measuring 41 mm.   6. The inferior vena cava is normal in size with greater than 50%  respiratory variability, suggesting right atrial pressure of 3 mmHg.   FINDINGS   Left Ventricle: Left ventricular ejection fraction, by estimation, is 60  to 65%. The left ventricle has normal function. The left ventricle has no  regional wall motion abnormalities. The left ventricular internal cavity  size was small. There is mild  concentric left ventricular  hypertrophy. Left ventricular diastolic  parameters were normal.   Right Ventricle: The right ventricular size is normal. No increase in  right ventricular wall thickness. Right ventricular systolic function is  normal.   Left Atrium: Left atrial size was normal in size.   Right Atrium: Right atrial size was normal in size.   Pericardium: There is no evidence of pericardial effusion.   Mitral Valve: The mitral valve is normal in structure. Trivial mitral  valve regurgitation. No evidence of mitral valve stenosis.   Tricuspid Valve: The tricuspid valve is normal in structure. Tricuspid  valve regurgitation is mild . No evidence of tricuspid stenosis.   Aortic Valve: The aortic valve is tricuspid. Aortic valve regurgitation is  trivial. No aortic stenosis is present. Aortic valve peak gradient  measures 12.2 mmHg.   Pulmonic Valve: The pulmonic valve was normal in structure. Pulmonic valve  regurgitation is not visualized. No evidence of pulmonic stenosis.   Aorta: There is borderline dilatation of the aortic root, measuring 37 mm.  There is moderate dilatation of the ascending aorta, measuring 41 mm.   Venous: The inferior vena cava is normal in size with greater than 50%  respiratory variability, suggesting right atrial pressure of 3 mmHg.   IAS/Shunts: No atrial level shunt detected by color flow Doppler.      LEFT VENTRICLE  PLAX 2D  LVIDd:         3.90 cm     Diastology  LVIDs:         2.60 cm     LV e' medial:    5.11 cm/s  LV PW:         1.20 cm     LV E/e' medial:  9.6  LV IVS:        1.30 cm     LV e' lateral:   5.77 cm/s  LVOT diam:     2.00 cm     LV E/e' lateral: 8.5  LV SV:         85  LV SV Index:   50  LVOT Area:     3.14 cm     LV Volumes (MOD)  LV vol d, MOD A2C: 73.4 ml  LV vol d, MOD A4C: 58.1 ml  LV vol s, MOD A2C: 27.9 ml  LV vol s, MOD A4C: 21.9 ml  LV SV MOD A2C:     45.5 ml  LV SV MOD A4C:     58.1 ml  LV SV MOD BP:      41.1 ml   RIGHT  VENTRICLE             IVC  RV Basal diam:  2.60 cm     IVC diam: 1.50 cm  RV Mid diam:    2.70 cm  RV S prime:     16.20 cm/s  TAPSE (M-mode): 2.2 cm   LEFT ATRIUM  Index        RIGHT ATRIUM           Index  LA diam:        3.40 cm 1.98 cm/m   RA Area:     13.50 cm  LA Vol (A2C):   43.4 ml 25.26 ml/m  RA Volume:   29.10 ml  16.94 ml/m  LA Vol (A4C):   43.0 ml 25.03 ml/m  LA Biplane Vol: 43.7 ml 25.43 ml/m   AORTIC VALVE  AV Area (Vmax): 2.44 cm  AV Vmax:        175.00 cm/s  AV Peak Grad:   12.2 mmHg  LVOT Vmax:      136.00 cm/s  LVOT Vmean:     95.400 cm/s  LVOT VTI:       0.272 m     AORTA  Ao Root diam: 3.70 cm  Ao Asc diam:  4.10 cm   MITRAL VALVE                TRICUSPID VALVE  MV Area (PHT): 2.63 cm     TR Peak grad:   27.5 mmHg  MV Decel Time: 288 msec     TR Vmax:        262.00 cm/s  MV E velocity: 49.30 cm/s  MV A velocity: 105.00 cm/s  SHUNTS  MV E/A ratio:  0.47         Systemic VTI:  0.27 m                              Systemic Diam: 2.00 cm  Disposition: Home Diet recommendation:  Discharge Diet Orders (From admission, onward)     Start     Ordered   07/02/21 0000  Diet - low sodium heart healthy       Comments: With 2000 mL Fluid Restriction   07/02/21 1117   07/02/21 0000  Diet Carb Modified        07/02/21 1117           Cardiac and Carb modified diet DISCHARGE MEDICATION: Allergies as of 07/02/2021       Reactions   Actos [pioglitazone]    Chronic  Pedal edema:contraindication   Avandia [rosiglitazone]    Chronic pedal edema:contraindication   Fosamax [alendronate Sodium]    unknown   Morphine Other (See Comments)   Ultram [tramadol] Shortness Of Breath, Palpitations   Glimepiride    hypersensitivity   Januvia [sitagliptin]    hypersensitivity   Lipitor [atorvastatin]    Causes muscle pain and swelling   Metformin And Related    gastritis   Prandin [repaglinide]    Abdominal pain: side effects   Tradjenta  [linagliptin]    GI problems, hypersensitivity   Zoloft [sertraline Hcl]    Jittery or zombie like   Aspirin Other (See Comments)   Bleeding ulcers    Bentyl [dicyclomine Hcl]    Came close to passing out. weak   Ciprofloxacin Nausea And Vomiting   Severe stomach upset   Dicyclomine    Other reaction(s): Unknown   Hctz [hydrochlorothiazide] Other (See Comments)   URINARY ISSUES   Keflex [cephalexin]    Morphine And Related Other (See Comments)   Body Spasms   Norvasc [amlodipine Besylate]    Weak and dizzy.    Oxycodone    Sulfa Antibiotics Hives   Sulfamethoxazole-trimethoprim    Other reaction(s): hives   Penicillins  Swelling, Rash   Amoxicillin causes stomach upset.        Medication List     TAKE these medications    Accu-Chek Guide test strip Generic drug: glucose blood   acetaminophen 500 MG tablet Commonly known as: TYLENOL Take 500 mg by mouth every 6 (six) hours as needed for mild pain, moderate pain, fever or headache.   BD Pen Needle Nano 2nd Gen 32G X 4 MM Misc Generic drug: Insulin Pen Needle See admin instructions.   BD Pen Needle Nano 2nd Gen 32G X 4 MM Misc Generic drug: Insulin Pen Needle   furosemide 20 MG tablet Commonly known as: LASIX Take 1 tablet (20 mg total) by mouth daily as needed for fluid or edema.   hydrALAZINE 10 MG tablet Commonly known as: APRESOLINE Take 10 mg by mouth 3 (three) times daily.   hydrocortisone 2.5 % rectal cream Commonly known as: ANUSOL-HC Apply to perianal area 3 times daily.   insulin glargine 100 UNIT/ML Solostar Pen Commonly known as: LANTUS Inject 20 Units into the skin daily with breakfast.   multivitamin tablet Take 1 tablet by mouth daily.   pantoprazole 40 MG tablet Commonly known as: PROTONIX TAKE 1 TABLET BY MOUTH EVERY DAY BEFORE BREAKFAST   polyethylene glycol 17 g packet Commonly known as: MIRALAX / GLYCOLAX Take 17 g by mouth daily as needed for mild constipation.    spironolactone 25 MG tablet Commonly known as: ALDACTONE Take 0.5 tablets (12.5 mg total) by mouth daily.   trandolapril 4 MG tablet Commonly known as: MAVIK Take 1 tablet (4 mg total) by mouth 2 (two) times daily.   Vitamin D-3 25 MCG (1000 UT) Caps Take 3 capsules by mouth in the morning.        Follow-up Information     Richardson Dopp T, PA-C. Go on 07/13/2021.   Specialties: Cardiology, Physician Assistant Why: Appointmnet made for you 07/13/21 @ 10:55am Contact information: [address] [place] [address] [phone]         Antony Contras, MD. Schedule an appointment as soon as possible for a visit in 1 week(s).   Specialty: Family Medicine Contact information: [address], [address] [place] [address] [phone]                Discharge Exam: Danley Danker Weights   06/30/21 2209  Weight: 68.7 kg   Vitals:   07/01/21 2219 07/02/21 0930  BP: (!) 118/59   Pulse: 74   Resp: 18   Temp: 98.9 F (37.2 C)   SpO2: 93% 99%   Examination: Physical Exam:  Constitutional: Thin elderly Caucasian female, in no acute distress appears calm and comfortable Respiratory: Diminished to auscultation bilaterally, no wheezing, rales, rhonchi or crackles. Normal respiratory effort and patient is not tachypenic. No accessory muscle use.  Unlabored breathing Cardiovascular: RRR, no murmurs / rubs / gallops. S1 and S2 auscultated. No extremity edema.  Abdomen: Soft, non-tender, non-distended. Bowel sounds positive.  GU: Deferred. Musculoskeletal: No clubbing / cyanosis of digits/nails. No joint deformity upper and lower extremities. Neurologic: CN 2-12 grossly intact with no focal deficits. Romberg sign and cerebellar reflexes not assessed.  Psychiatric: Normal judgment and insight. Alert and oriented x 3. Normal mood and appropriate affect.   Condition at discharge: stable  The results of significant diagnostics from this hospitalization  (including imaging, microbiology, ancillary and laboratory) are listed below for reference.   Imaging Studies: DG Chest 2 View  Result Date:  06/30/2021 CLINICAL DATA:  Chest pain EXAM: CHEST - 2 VIEW COMPARISON:  CTA chest dated 09/18/2020 FINDINGS: Lungs are clear.  No pleural effusion or pneumothorax. The heart is normal in size. Mild degenerative changes of the visualized thoracolumbar spine. IMPRESSION: Normal chest radiographs. Electronically Signed   By: Julian Hy M.D.   On: 06/30/2021 18:33   CT HEAD WO CONTRAST (5MM)  Result Date: 06/30/2021 CLINICAL DATA:  Dizziness, headache, hypertension EXAM: CT HEAD WITHOUT CONTRAST TECHNIQUE: Contiguous axial images were obtained from the base of the skull through the vertex without intravenous contrast. RADIATION DOSE REDUCTION: This exam was performed according to the departmental dose-optimization program which includes automated exposure control, adjustment of the mA and/or kV according to patient size and/or use of iterative reconstruction technique. COMPARISON:  05/04/2020 FINDINGS: Brain: No acute infarct or hemorrhage. Lateral ventricles and midline structures are unremarkable. No acute extra-axial fluid collections. No mass effect. Age-appropriate cerebral atrophy. Vascular: Aneurysm coil at the right cavernous sinus unchanged. No hyperdense vessel. Skull: Normal. Negative for fracture or focal lesion. Sinuses/Orbits: No acute finding. Other: None. IMPRESSION: 1. No acute intracranial process. Electronically Signed   By: Randa Ngo M.D.   On: 06/30/2021 19:24   DG CHEST PORT 1 VIEW  Result Date: 07/02/2021 CLINICAL DATA:  Shortness of breath. EXAM: PORTABLE CHEST 1 VIEW COMPARISON:  AP Lat 06/30/2021. FINDINGS: Mild cardiomegaly. No vascular congestion is seen. The mediastinal configuration is stable with aortic atherosclerosis. The lungs are clear. No pleural effusion is seen. Osteopenia. IMPRESSION: No evidence of acute chest disease.  Stable chest with aortic atherosclerosis. Electronically Signed   By: Telford Nab M.D.   On: 07/02/2021 07:35   ECHOCARDIOGRAM COMPLETE  Result Date: 07/01/2021    ECHOCARDIOGRAM REPORT   Patient Name:   [name] Date of Exam: 07/01/2021 Medical Rec #:  [id number]       Height:       63.0 in Accession #:    [id number]      Weight:       151.5 lb Date of Birth:  Mar 06, 1932       BSA:          1.718 m Patient Age:    66 years        BP:           147/64 mmHg Patient Gender: F               HR:           74 bpm. Exam Location:  Inpatient Procedure: 2D Echo, Cardiac Doppler and Color Doppler Indications:    CHF  History:        Patient has prior history of Echocardiogram examinations. Risk                 Factors:Hypertension and Diabetes.  Sonographer:    Jyl Heinz Referring Phys: [id number] [place]  1. Left ventricular ejection fraction, by estimation, is 60 to 65%. The left ventricle has normal function. The left ventricle has no regional wall motion abnormalities. There is mild concentric left ventricular hypertrophy. Left ventricular diastolic parameters were normal.  2. Right ventricular systolic function is normal. The right ventricular size is normal.  3. The mitral valve is normal in structure. Trivial mitral valve regurgitation. No evidence of mitral stenosis.  4. The aortic valve is tricuspid. Aortic valve regurgitation is trivial. No aortic stenosis is present.  5. There is borderline dilatation of the aortic root, measuring  37 mm. There is moderate dilatation of the ascending aorta, measuring 41 mm.  6. The inferior vena cava is normal in size with greater than 50% respiratory variability, suggesting right atrial pressure of 3 mmHg. FINDINGS  Left Ventricle: Left ventricular ejection fraction, by estimation, is 60 to 65%. The left ventricle has normal function. The left ventricle has no regional wall motion abnormalities. The left ventricular internal cavity size was small.  There is mild concentric left ventricular hypertrophy. Left ventricular diastolic parameters were normal. Right Ventricle: The right ventricular size is normal. No increase in right ventricular wall thickness. Right ventricular systolic function is normal. Left Atrium: Left atrial size was normal in size. Right Atrium: Right atrial size was normal in size. Pericardium: There is no evidence of pericardial effusion. Mitral Valve: The mitral valve is normal in structure. Trivial mitral valve regurgitation. No evidence of mitral valve stenosis. Tricuspid Valve: The tricuspid valve is normal in structure. Tricuspid valve regurgitation is mild . No evidence of tricuspid stenosis. Aortic Valve: The aortic valve is tricuspid. Aortic valve regurgitation is trivial. No aortic stenosis is present. Aortic valve peak gradient measures 12.2 mmHg. Pulmonic Valve: The pulmonic valve was normal in structure. Pulmonic valve regurgitation is not visualized. No evidence of pulmonic stenosis. Aorta: There is borderline dilatation of the aortic root, measuring 37 mm. There is moderate dilatation of the ascending aorta, measuring 41 mm. Venous: The inferior vena cava is normal in size with greater than 50% respiratory variability, suggesting right atrial pressure of 3 mmHg. IAS/Shunts: No atrial level shunt detected by color flow Doppler.  LEFT VENTRICLE PLAX 2D LVIDd:         3.90 cm     Diastology LVIDs:         2.60 cm     LV e' medial:    5.11 cm/s LV PW:         1.20 cm     LV E/e' medial:  9.6 LV IVS:        1.30 cm     LV e' lateral:   5.77 cm/s LVOT diam:     2.00 cm     LV E/e' lateral: 8.5 LV SV:         85 LV SV Index:   50 LVOT Area:     3.14 cm  LV Volumes (MOD) LV vol d, MOD A2C: 73.4 ml LV vol d, MOD A4C: 58.1 ml LV vol s, MOD A2C: 27.9 ml LV vol s, MOD A4C: 21.9 ml LV SV MOD A2C:     45.5 ml LV SV MOD A4C:     58.1 ml LV SV MOD BP:      41.1 ml RIGHT VENTRICLE             IVC RV Basal diam:  2.60 cm     IVC diam: 1.50 cm  RV Mid diam:    2.70 cm RV S prime:     16.20 cm/s TAPSE (M-mode): 2.2 cm LEFT ATRIUM             Index        RIGHT ATRIUM           Index LA diam:        3.40 cm 1.98 cm/m   RA Area:     13.50 cm LA Vol (A2C):   43.4 ml 25.26 ml/m  RA Volume:   29.10 ml  16.94 ml/m LA Vol (A4C):   43.0 ml 25.03 ml/m  LA Biplane Vol: 43.7 ml 25.43 ml/m  AORTIC VALVE AV Area (Vmax): 2.44 cm AV Vmax:        175.00 cm/s AV Peak Grad:   12.2 mmHg LVOT Vmax:      136.00 cm/s LVOT Vmean:     95.400 cm/s LVOT VTI:       0.272 m  AORTA Ao Root diam: 3.70 cm Ao Asc diam:  4.10 cm MITRAL VALVE                TRICUSPID VALVE MV Area (PHT): 2.63 cm     TR Peak grad:   27.5 mmHg MV Decel Time: 288 msec     TR Vmax:        262.00 cm/s MV E velocity: 49.30 cm/s MV A velocity: 105.00 cm/s  SHUNTS MV E/A ratio:  0.47         Systemic VTI:  0.27 m                             Systemic Diam: 2.00 cm Kardie Tobb DO Electronically signed by Berniece Salines DO Signature Date/Time: 07/01/2021/5:04:58 PM    Final    CT Angio Chest/Abd/Pel for Dissection W and/or Wo Contrast  Result Date: 06/30/2021 CLINICAL DATA:  Hypertension and dizziness. EXAM: CT ANGIOGRAPHY CHEST, ABDOMEN AND PELVIS TECHNIQUE: Noncontrast chest CT was performed. Multidetector CT imaging through the chest, abdomen and pelvis was performed using the standard protocol during bolus administration of intravenous contrast. Multiplanar reconstructed images and MIPs were obtained and reviewed to evaluate the vascular anatomy. RADIATION DOSE REDUCTION: This exam was performed according to the departmental dose-optimization program which includes automated exposure control, adjustment of the mA and/or kV according to patient size and/or use of iterative reconstruction technique. CONTRAST:  15m OMNIPAQUE IOHEXOL 350 MG/ML SOLN COMPARISON:  CT abdomen and pelvis 12/27/2014.CT angiogram chest 09/18/2020. FINDINGS: CTA CHEST FINDINGS Cardiovascular: Preferential opacification of the thoracic  aorta. There is stable aneurysmal dilatation of the ascending thoracic aorta measuring up to 4.4 cm, unchanged from the prior examination. No evidence for dissection. There are atherosclerotic calcifications of the aorta. No central pulmonary embolism identified. Heart is mildly enlarged, unchanged. No pericardial effusion. Mediastinum/Nodes: No enlarged mediastinal, hilar, or axillary lymph nodes. Thyroid gland, trachea, and esophagus demonstrate no significant findings. Lungs/Pleura: There is some scarring in the lung apices. There is a 3 mm nodule in the right lower lobe image 5/79 which is new from prior. There is no focal lung infiltrate, pleural effusion, or pneumothorax. Musculoskeletal: No chest wall abnormality. No acute or significant osseous findings. Review of the MIP images confirms the above findings. CTA ABDOMEN AND PELVIS FINDINGS VASCULAR Aorta: Normal caliber aorta without aneurysm, dissection, vasculitis or significant stenosis. There are mild atherosclerotic calcifications of the aorta. Celiac: Patent without evidence of aneurysm, dissection, vasculitis or significant stenosis. SMA: Patent without evidence of aneurysm, dissection, vasculitis or significant stenosis. Renals: Both renal arteries are patent without evidence of aneurysm, dissection, vasculitis, fibromuscular dysplasia or significant stenosis. IMA: Patent without evidence of aneurysm, dissection, vasculitis or significant stenosis. Inflow: Patent without evidence of aneurysm, dissection, vasculitis or significant stenosis. Veins: No obvious venous abnormality within the limitations of this arterial phase study. Review of the MIP images confirms the above findings. NON-VASCULAR Hepatobiliary: No focal liver abnormality is seen. No gallstones, gallbladder wall thickening, or biliary dilatation. Pancreas: There is fatty infiltration of the pancreas, unchanged. No acute inflammation. Spleen: Normal in size  without focal abnormality.  Adrenals/Urinary Tract: There is some scarring in the lower poles of the kidneys. Otherwise, the kidneys, adrenal glands and bladder are within normal limits. Stomach/Bowel: Stomach is within normal limits. no evidence of bowel wall thickening, distention, or inflammatory changes. There is diffuse colonic diverticulosis. The appendix is not seen. There is a large amount of stool in the rectum. Lymphatic: No enlarged lymph nodes are seen. Reproductive: Status post hysterectomy. No adnexal masses. Other: No abdominal wall hernia or abnormality. No abdominopelvic ascites. Musculoskeletal: No acute or significant osseous findings. Review of the MIP images confirms the above findings. IMPRESSION: 1. Unchanged 4.4 cm ascending aortic aneurysm. Recommend annual imaging followup by CTA or MRA. This recommendation follows 2010 ACCF/AHA/AATS/ACR/ASA/SCA/SCAI/SIR/STS/SVM Guidelines for the Diagnosis and Management of Patients with Thoracic Aortic Disease. Circulation. 2010; 121: Q229-N989. Aortic aneurysm NOS (ICD10-I71.9) 2. No evidence for aortic dissection. 3. No acute localizing process in the chest, abdomen or pelvis. 4. Stable mild cardiomegaly. 5. 3 mm right solid pulmonary nodule. No routine follow-up imaging is recommended per Fleischner Society Guidelines. These guidelines do not apply to immunocompromised patients and patients with cancer. Follow up in patients with significant comorbidities as clinically warranted. For lung cancer screening, adhere to Lung-RADS guidelines. Reference: Radiology. 2017; 284(1):228-43. 6. Colonic diverticulosis with large amount of stool in the rectum. 7.  Aortic Atherosclerosis (ICD10-I70.0). Electronically Signed   By: Ronney Asters M.D.   On: 06/30/2021 19:38    Microbiology: Results for orders placed or performed during the hospital encounter of 06/30/21  Urine Culture     Status: Abnormal   Collection Time: 06/30/21  8:01 PM   Specimen: Urine, Clean Catch  Result Value Ref  Range Status   Specimen Description   Final    URINE, CLEAN CATCH Performed at [facility], [place] [address], [place], [place] [address]    Special Requests   Final    NONE Performed at [facility], [place] [address] [place], [place] [address]    Culture (A)  Final    <10,000 COLONIES/mL INSIGNIFICANT GROWTH Performed at [place] [address], [place],  [address]    Report Status 07/01/2021 FINAL  Final    Labs: CBC: Recent Labs  Lab 06/30/21 1806 06/30/21 1848 07/01/21 0432 07/02/21 0414  WBC 9.7  --  11.1* 9.9  NEUTROABS 5.4  --   --  3.8  HGB 11.1* 12.6 11.2* 11.5*  HCT 35.5* 37.0 36.2 36.8  MCV 80.3  --  79.7* 79.0*  PLT 224  --  227 856   Basic Metabolic Panel: Recent Labs  Lab 06/30/21 1806 06/30/21 1848 06/30/21 2110 07/01/21 0432 07/02/21 0414  NA 140 139  --  141 143  K 4.4 4.3  --  4.1 4.1  CL 108 105  --  106 106  CO2 25  --   --  27 27  GLUCOSE 163* 155*  --  98 119*  BUN 33* 32*  --  33* 40*  CREATININE 0.85 0.90  --  0.95 1.19*  CALCIUM 9.6  --   --  9.4 9.6  MG  --   --  1.8  --  2.2  PHOS  --   --   --   --  4.7*   Liver Function Tests: Recent Labs  Lab 06/30/21 1806 07/01/21 0432 07/02/21 0414  AST '17 15 18  '$ ALT '11 11 12  '$ ALKPHOS 56 49 49  BILITOT 0.4 0.5  0.5  PROT 7.1 7.0 7.3  ALBUMIN 3.9 4.0 4.0   CBG: Recent Labs  Lab 07/01/21 1154 07/01/21 1800 07/01/21 1956 07/02/21 0736 07/02/21 1144  GLUCAP 101* 114* 161* 117* 103*    Discharge time spent: greater than 30 minutes.  Signed: Raiford Noble, DO [facility] 07/02/2021

## 2021-07-02 NOTE — TOC Transition Note (Signed)
Transition of Care [facility]) - CM/SW Discharge Note   Patient Details  Name: ALIYA SOL MRN: [id number] Date of Birth: May 29, 1932  Transition of Care [facility]) CM/SW Contact:  Leeroy Cha, RN Phone Number: 07/02/2021, 11:28 AM   Clinical Narrative:    Dcd with hhc-disease management for home heart failure screening.   Final next level of care: [place] Barriers to Discharge: No Barriers Identified   Patient Goals and CMS Choice Patient states their goals for this hospitalization and ongoing recovery are:: to go home CMS Medicare.gov Compare Post Acute Care list provided to:: Patient Choice offered to / list presented to : Patient  Discharge Placement                       Discharge Plan and Services   Discharge Planning Services: CM Consult Post Acute Care Choice: Home Health                    HH Arranged: Disease Management [place] Agency: [place] Date [place]: 07/01/21 Time [place]: [phone] Representative spoke with at [place]: [place]  Social Determinants of Health (SDOH) Interventions     Readmission Risk Interventions     View : No data to display.

## 2021-07-09 DIAGNOSIS — I058 Other rheumatic mitral valve diseases: Secondary | ICD-10-CM | POA: Diagnosis not present

## 2021-07-09 DIAGNOSIS — I6523 Occlusion and stenosis of bilateral carotid arteries: Secondary | ICD-10-CM | POA: Diagnosis not present

## 2021-07-09 DIAGNOSIS — I8390 Asymptomatic varicose veins of unspecified lower extremity: Secondary | ICD-10-CM | POA: Diagnosis not present

## 2021-07-09 DIAGNOSIS — I509 Heart failure, unspecified: Secondary | ICD-10-CM | POA: Diagnosis not present

## 2021-07-09 DIAGNOSIS — I110 Hypertensive heart disease with heart failure: Secondary | ICD-10-CM | POA: Diagnosis not present

## 2021-07-09 DIAGNOSIS — I7121 Aneurysm of the ascending aorta, without rupture: Secondary | ICD-10-CM | POA: Diagnosis not present

## 2021-07-09 DIAGNOSIS — D649 Anemia, unspecified: Secondary | ICD-10-CM | POA: Diagnosis not present

## 2021-07-09 DIAGNOSIS — E1142 Type 2 diabetes mellitus with diabetic polyneuropathy: Secondary | ICD-10-CM | POA: Diagnosis not present

## 2021-07-09 DIAGNOSIS — I2510 Atherosclerotic heart disease of native coronary artery without angina pectoris: Secondary | ICD-10-CM | POA: Diagnosis not present

## 2021-07-12 ENCOUNTER — Encounter: Payer: Self-pay | Admitting: Physician Assistant

## 2021-07-12 DIAGNOSIS — R911 Solitary pulmonary nodule: Secondary | ICD-10-CM

## 2021-07-12 HISTORY — DX: Solitary pulmonary nodule: R91.1

## 2021-07-12 NOTE — Progress Notes (Signed)
Cardiology Office Note:    Date:  07/13/2021   ID:  [name], DOB 20-Dec-1932, MRN [id number]  PCP:  Antony Contras, MD  [facility] Providers Cardiologist:  Fransico Him, MD    Referring MD: Antony Contras, MD   Chief Complaint:  Hospitalization Follow-up (CHF)    Patient Profile: Thoracic aortic aneurysm CT 08/2020: 44 mm CT 05/2021: 44 mm (HFpEF) heart failure with preserved ejection fraction  Admit 05/2021 Hypertension Hyperlipidemia Mitral valve prolapse Mild mitral regurgitation Echo 07/2021: Trivial MR Diabetes mellitus Carotid artery disease Peptic ulcer disease with hiatal hernia Cerebral aneurysm s/p coiling Hx of ?TIA Monitor x4 days (reaction to pads)-no arrhythmias during that time Mild pulmonary hypertension Echo 2019: PASP 40 Hx of chronic chest pain and shortness of breath Cardiac catheterization 2002: Normal coronary arteries Myoview 2017 low risk Colon CA Increased social stress Primary caregiver for her husband who has dementia Aortic atherosclerosis  Prior CV Studies: ECHO COMPLETE WO IMAGING ENHANCING AGENT 07/01/2021 EF 60-65, no RWMA, mild LVH, normal RVSF, trivial MR, trivial AI, aortic root 37 mm, ascending aorta 41 mm    Chest/abdomen/pelvis angio 06/30/2021 IMPRESSION: 1. Unchanged 4.4 cm ascending aortic aneurysm. Recommend annual imaging followup by CTA or MRA. This recommendation follows 2010 ACCF/AHA/AATS/ACR/ASA/SCA/SCAI/SIR/STS/SVM Guidelines for the Diagnosis and Management of Patients with Thoracic Aortic Disease. Circulation. 2010; 121: I951-O841. Aortic aneurysm NOS (ICD10-I71.9) 2. No evidence for aortic dissection. 3. No acute localizing process in the chest, abdomen or pelvis. 4. Stable mild cardiomegaly. 5. 3 mm right solid pulmonary nodule. No routine follow-up imaging is recommended per Fleischner Society Guidelines. These guidelines do not apply to immunocompromised patients and patients with cancer. Follow up in  patients with significant comorbidities as clinically warranted. For lung cancer screening, adhere to Lung-RADS guidelines. Reference: Radiology. 2017; 284(1):228-43. 6. Colonic diverticulosis with large amount of stool in the rectum. 7.  Aortic Atherosclerosis (ICD10-I70.0).  Carotid US 01/16/2018 Bilateral ICA 1-39  Event monitor 08/24/2017 Sinus bradycardia, Normal sinus rhythm and sinust tachycardia with average herat rate 57bpm. The HR ranged from 46 to 120bpm. Occasional PVCs with PVC load < 1%  Myoview 05/18/2015 EF 64; no ischemia or infarction; low risk  Cardiac catheterization 08/17/2000 EF 62 Normal coronary arteries  History of Present Illness:   [name] is a 88 y.o. female with the above problem list.  She was last seen by Dr. Radford Pax in July 2022.  She was admitted 5/31-6/2 with acute CHF.  She presented with shortness of breath, leg swelling.  Her blood pressure was markedly elevated upon presentation (systolic 660Y).  She had improvement with IV furosemide.  Her BNP was mildly elevated at 139.4.  Her hsTrops were also mildly elevated without significant delta (10 >> 21).  Chest CT demonstrated stable ascending thoracic aortic aneurysm at 4.4 cm.  There was a 3 mm right pulmonary nodule and no routine follow-up was recommended.  Echocardiogram demonstrated normal LV function, mild LVH.    She returns for posthospitalization follow-up.  She is here alone.  She has been fairly stable since she was discharged from the hospital.  Her weights at home have essentially been unchanged.  She has had chest pain for over a year now.  This comes and goes.  It has not changed.  She has not had syncope, orthopnea.  She does have lower extremity edema that improves/resolves with elevation.  She has chronic shortness of breath with minimal activity.  Her breathing has improved since prior to admission and has  remained stable.    Past Medical History:  Diagnosis Date   Abdominal pain,  chronic, left lower quadrant    Acute torn meniscus of knee    Adenomatous colon polyp 1970   carcinoma in situ   Allergic rhinitis    Amaurosis fugax 08/07/2012   Anxiety    Ascending aortic aneurysm ([place]) 12/07/2015   40m by chest CTA 08/2020   Benign essential tremor syndrome    Bicuspid aortic valve    no AS on 07/2019   Carotid stenosis    1039 bilateral by dopplers 03/2017.    colon ca dx'd 1970   surg only   DDD (degenerative disc disease)    Diverticulitis    Diverticulosis    [place]    Duodenitis    peptic, with gastric heterotopia   Endometriosis    s/p hysterectomy   Fundic gland polyposis of stomach    GERD (gastroesophageal reflux disease)    Heart murmur    Hiatal hernia 02/03/2005   History of cerebral aneurysm repair    s/p coiling   History of hemorrhoids    with bleeding   History of shingles    HLD (hyperlipidemia)    HTN (hypertension)    Hypercholesteremia    IBS (irritable bowel syndrome)    Iron deficiency    Lung nodule 07/12/2021   CT 05/2021: 3 mm right solid pulmonary nodule-no routine follow-up imaging recommended   Migraine    MVP (mitral valve prolapse)    Osteopenia    Peripheral neuropathy    Toe fracture, right    second toe   UTI (lower urinary tract infection)    Varicose vein    Current Medications: Current Meds  Medication Sig   ACCU-CHEK GUIDE test strip    acetaminophen (TYLENOL) 500 MG tablet Take 500 mg by mouth every 6 (six) hours as needed for mild pain, moderate pain, fever or headache.   Cholecalciferol (VITAMIN D-3) 1000 UNITS CAPS Take 3 capsules by mouth in the morning.   furosemide (LASIX) 20 MG tablet Take 1 tablet (20 mg total) by mouth daily as needed for fluid or edema.   hydrALAZINE (APRESOLINE) 10 MG tablet Take 10 mg by mouth 3 (three) times daily.   hydrocortisone (ANUSOL-HC) 2.5 % rectal cream Apply to perianal area 3 times daily. (Patient taking differently: Place rectally as needed  for hemorrhoids or anal itching. Apply to perianal area 3 times daily.)   insulin glargine (LANTUS) 100 UNIT/ML Solostar Pen Inject 20 Units into the skin daily with breakfast.   Insulin Pen Needle (BD PEN NEEDLE NANO 2ND GEN) 32G X 4 MM MISC See admin instructions.   Multiple Vitamin (MULTIVITAMIN) tablet Take 1 tablet by mouth daily.     pantoprazole (PROTONIX) 40 MG tablet TAKE 1 TABLET BY MOUTH EVERY DAY BEFORE BREAKFAST   polyethylene glycol (MIRALAX / GLYCOLAX) 17 g packet Take 17 g by mouth daily as needed for mild constipation.   spironolactone (ALDACTONE) 25 MG tablet Take 0.5 tablets (12.5 mg total) by mouth daily.   trandolapril (MAVIK) 4 MG tablet Take 1 tablet (4 mg total) by mouth 2 (two) times daily.    Allergies:   Actos [pioglitazone], Avandia [rosiglitazone], Fosamax [alendronate sodium], Morphine, Ultram [tramadol], Glimepiride, Januvia [sitagliptin], Lipitor [atorvastatin], Metformin and related, Prandin [repaglinide], Tradjenta [linagliptin], Zoloft [sertraline hcl], Aspirin, Bentyl [dicyclomine hcl], Ciprofloxacin, Dicyclomine, Hctz [hydrochlorothiazide], Keflex [cephalexin], Morphine and related, Norvasc [amlodipine besylate], Oxycodone, Sulfa antibiotics, Sulfamethoxazole-trimethoprim, and Penicillins  Social History   Tobacco Use   Smoking status: Never   Smokeless tobacco: Never  Vaping Use   Vaping Use: Never used  Substance Use Topics   Alcohol use: No   Drug use: No    Family Hx: The patient's family history includes Colon cancer in an other family member; Diabetes in her brother, brother, paternal grandmother, and another family member; Heart disease in her brother, brother, and father; Hypertension in her brother; Kidney disease in her father; Microcephaly in her brother; Ovarian cancer in her mother.  Review of Systems  Psychiatric/Behavioral:         +stress related to caring for husband in his 69s     EKGs/Labs/Other Test Reviewed:    EKG:  EKG is  notn ordered today.  The ekg ordered today demonstrates /a  Recent Labs: 06/30/2021: B Natriuretic Peptide 139.4 07/02/2021: ALT 12; BUN 40; Creatinine, Ser 1.19; Hemoglobin 11.5; Magnesium 2.2; Platelets 249; Potassium 4.1; Sodium 143   Recent Lipid Panel No results for input(s): "CHOL", "TRIG", "HDL", "VLDL", "LDLCALC", "LDLDIRECT" in the last 8760 hours.   Risk Assessment/Calculations/Metrics:              Physical Exam:    VS:  BP (!) 140/58   Pulse 86   Ht '5\' 3"'$  (1.6 m)   Wt 147 lb 9.6 oz (67 kg)   SpO2 96%   BMI 26.15 kg/m     Wt Readings from Last 3 Encounters:  07/13/21 147 lb 9.6 oz (67 kg)  06/30/21 151 lb 7.3 oz (68.7 kg)  01/21/21 151 lb (68.5 kg)    Constitutional:      Appearance: Healthy appearance. Not in distress.  Neck:     Vascular: No JVR.  Pulmonary:     Effort: Pulmonary effort is normal.     Breath sounds: No wheezing. No rales.  Cardiovascular:     Normal rate. Regularly irregular rhythm. Normal S1. Normal S2.      Murmurs: There is no murmur.  Edema:    Peripheral edema present.    Pretibial: bilateral trace edema of the pretibial area. Abdominal:     Palpations: Abdomen is soft.  Musculoskeletal:     Cervical back: Neck supple. Skin:    General: Skin is warm and dry.  Neurological:     Mental Status: Alert and oriented to person, place and time.         ASSESSMENT & PLAN:   Ascending aortic aneurysm (HCC) Stable measurement by CT in May 2023 (44 mm).  She will need another CT in May 2024.  (HFpEF) heart failure with preserved ejection fraction (HCC) EF normal by echocardiogram 07/01/2021.  Overall, volume status stable.  She is NYHA IIb-III.  She cannot tolerate blood pressures lower than 914N systolic.  I have recommended that she continue taking furosemide 20 mg daily.  We discussed when she would take an extra dose of furosemide.  Continue spironolactone 12.5 mg daily.  I do not think she would be able to tolerate Entresto.  We could  consider SGLT2 inhibitor in the future.  Obtain BMET, magnesium today (she has a lot of PACs on exam).  Essential hypertension Borderline control.  As noted, she cannot tolerate systolic blood pressures less than 140.  Continue furosemide 20 mg daily, hydralazine 10 mg 3 times a day, spironolactone 12.5 mg daily, trandolapril 4 mg twice daily.  Lung nodule 3 mm right solid pulmonary nodule noted on CT scan in the hospital.  Follow-up  with PCP for further management.           Dispo:  Return in 2 months (on 09/12/2021) for Scheduled Follow Up with Dr. Radford Pax.   Medication Adjustments/Labs and Tests Ordered: Current medicines are reviewed at length with the patient today.  Concerns regarding medicines are outlined above.  Tests Ordered: Orders Placed This Encounter  Procedures   Basic metabolic panel   Magnesium   Medication Changes: No orders of the defined types were placed in this encounter.  Signed, Richardson Dopp, PA-C  07/13/2021 11:52 AM    [facility] [place], [place], [place]  [address] Phone: [phone]; Fax: [phone]

## 2021-07-13 ENCOUNTER — Ambulatory Visit: Payer: Medicare PPO | Admitting: Physician Assistant

## 2021-07-13 ENCOUNTER — Encounter: Payer: Self-pay | Admitting: Physician Assistant

## 2021-07-13 VITALS — BP 140/58 | HR 86 | Ht 63.0 in | Wt 147.6 lb

## 2021-07-13 DIAGNOSIS — I10 Essential (primary) hypertension: Secondary | ICD-10-CM | POA: Diagnosis not present

## 2021-07-13 DIAGNOSIS — I7121 Aneurysm of the ascending aorta, without rupture: Secondary | ICD-10-CM | POA: Diagnosis not present

## 2021-07-13 DIAGNOSIS — I491 Atrial premature depolarization: Secondary | ICD-10-CM | POA: Diagnosis not present

## 2021-07-13 DIAGNOSIS — R911 Solitary pulmonary nodule: Secondary | ICD-10-CM | POA: Diagnosis not present

## 2021-07-13 DIAGNOSIS — I5032 Chronic diastolic (congestive) heart failure: Secondary | ICD-10-CM

## 2021-07-13 LAB — BASIC METABOLIC PANEL
BUN/Creatinine Ratio: 34 — ABNORMAL HIGH (ref 12–28)
BUN: 35 mg/dL — ABNORMAL HIGH (ref 8–27)
CO2: 24 mmol/L (ref 20–29)
Calcium: 10.1 mg/dL (ref 8.7–10.3)
Chloride: 100 mmol/L (ref 96–106)
Creatinine, Ser: 1.03 mg/dL — ABNORMAL HIGH (ref 0.57–1.00)
Glucose: 105 mg/dL — ABNORMAL HIGH (ref 70–99)
Potassium: 4.9 mmol/L (ref 3.5–5.2)
Sodium: 139 mmol/L (ref 134–144)
eGFR: 53 mL/min/{1.73_m2} — ABNORMAL LOW (ref >59)

## 2021-07-13 LAB — MAGNESIUM: Magnesium: 2.2 mg/dL (ref 1.6–2.3)

## 2021-07-13 NOTE — Assessment & Plan Note (Signed)
3 mm right solid pulmonary nodule noted on CT scan in the hospital.  Follow-up with PCP for further management.

## 2021-07-13 NOTE — Assessment & Plan Note (Signed)
Borderline control.  As noted, she cannot tolerate systolic blood pressures less than 140.  Continue furosemide 20 mg daily, hydralazine 10 mg 3 times a day, spironolactone 12.5 mg daily, trandolapril 4 mg twice daily.

## 2021-07-13 NOTE — Assessment & Plan Note (Signed)
Stable measurement by CT in May 2023 (44 mm).  She will need another CT in May 2024.

## 2021-07-13 NOTE — Assessment & Plan Note (Signed)
EF normal by echocardiogram 07/01/2021.  Overall, volume status stable.  She is NYHA IIb-III.  She cannot tolerate blood pressures lower than 158N systolic.  I have recommended that she continue taking furosemide 20 mg daily.  We discussed when she would take an extra dose of furosemide.  Continue spironolactone 12.5 mg daily.  I do not think she would be able to tolerate Entresto.  We could consider SGLT2 inhibitor in the future.  Obtain BMET, magnesium today (she has a lot of PACs on exam).

## 2021-07-13 NOTE — Patient Instructions (Signed)
Medication Instructions:   Your physician recommends that you continue on your current medications as directed. Please refer to the Current Medication list given to you today.   *If you need a refill on your cardiac medications before your next appointment, please call your pharmacy*   Lab Work:  TODAY!!!! MAG/BMET  If you have labs (blood work) drawn today and your tests are completely normal, you will receive your results only by: [place] (if you have MyChart) OR A paper copy in the mail If you have any lab test that is abnormal or we need to change your treatment, we will call you to review the results.   Testing/Procedures:  None ordered.   Follow-Up: At [facility], you and your health needs are our priority.  As part of our continuing mission to provide you with exceptional heart care, we have created designated Provider Care Teams.  These Care Teams include your primary Cardiologist (physician) and Advanced Practice Providers (APPs -  Physician Assistants and Nurse Practitioners) who all work together to provide you with the care you need, when you need it.  We recommend signing up for the patient portal called "MyChart".  Sign up information is provided on this After Visit Summary.  MyChart is used to connect with patients for Virtual Visits (Telemedicine).  Patients are able to view lab/test results, encounter notes, upcoming appointments, etc.  Non-urgent messages can be sent to your provider as well.   To learn more about what you can do with MyChart, go to [email or web address].    Your next appointment:   2 month(s)  The format for your next appointment:   In Person  Provider:   Fransico Him, MD    Important Information About Sugar

## 2021-07-15 DIAGNOSIS — I5032 Chronic diastolic (congestive) heart failure: Secondary | ICD-10-CM | POA: Diagnosis not present

## 2021-07-15 DIAGNOSIS — I714 Abdominal aortic aneurysm, without rupture, unspecified: Secondary | ICD-10-CM | POA: Diagnosis not present

## 2021-07-15 DIAGNOSIS — R5381 Other malaise: Secondary | ICD-10-CM | POA: Diagnosis not present

## 2021-07-15 DIAGNOSIS — I10 Essential (primary) hypertension: Secondary | ICD-10-CM | POA: Diagnosis not present

## 2021-07-15 DIAGNOSIS — R911 Solitary pulmonary nodule: Secondary | ICD-10-CM | POA: Diagnosis not present

## 2021-07-16 ENCOUNTER — Telehealth: Payer: Self-pay | Admitting: Physician Assistant

## 2021-07-16 DIAGNOSIS — I5032 Chronic diastolic (congestive) heart failure: Secondary | ICD-10-CM

## 2021-07-16 MED ORDER — FUROSEMIDE 20 MG PO TABS: ORAL_TABLET | ORAL | 3 refills | Status: DC

## 2021-07-16 NOTE — Telephone Encounter (Signed)
Patient aware to decrease lasix to 20 mg MWF and additional dose if weight increases by 3+ pounds overnight.  Will return on 07/30/21 for BMET.

## 2021-07-16 NOTE — Telephone Encounter (Signed)
Patient returned call for her lab results. °

## 2021-07-16 NOTE — Telephone Encounter (Signed)
-----   Message from Liliane Shi, PA-C sent at 07/15/2021  8:39 AM EDT ----- Creatinine stable.  BUN/Creatinine ratio elevated.  K+ normal.  Pt was taking Furosemide 20 mg once daily when I saw her (not as needed) PLAN:  -Decrease Furosemide to 20 mg every M, W, F -Weight daily and take extra Furosemide 20 mg if weight increases 3+ lbs in 1 day -BMET 2 weeks. Richardson Dopp, PA-C    07/15/2021 8:37 AM

## 2021-07-30 ENCOUNTER — Other Ambulatory Visit: Payer: Medicare PPO

## 2021-07-30 DIAGNOSIS — I5032 Chronic diastolic (congestive) heart failure: Secondary | ICD-10-CM

## 2021-07-31 LAB — BASIC METABOLIC PANEL
BUN/Creatinine Ratio: 35 — ABNORMAL HIGH (ref 12–28)
BUN: 29 mg/dL — ABNORMAL HIGH (ref 8–27)
CO2: 26 mmol/L (ref 20–29)
Calcium: 9.8 mg/dL (ref 8.7–10.3)
Chloride: 106 mmol/L (ref 96–106)
Creatinine, Ser: 0.83 mg/dL (ref 0.57–1.00)
Glucose: 88 mg/dL (ref 70–99)
Potassium: 4.9 mmol/L (ref 3.5–5.2)
Sodium: 143 mmol/L (ref 134–144)
eGFR: 69 mL/min/{1.73_m2} (ref >59)

## 2021-08-02 NOTE — Progress Notes (Signed)
Pt has been made aware of normal result and verbalized understanding.  jw

## 2021-08-09 ENCOUNTER — Ambulatory Visit: Payer: Medicare PPO | Admitting: Podiatry

## 2021-08-16 ENCOUNTER — Other Ambulatory Visit: Payer: Self-pay | Admitting: Cardiology

## 2021-08-20 ENCOUNTER — Ambulatory Visit: Payer: Medicare PPO | Admitting: Podiatry

## 2021-08-22 IMAGING — CT CT ANGIO CHEST
2 of 8 series · 16 of 46 positions shown · IV contrast (OMNIPAQUE 350)
Comparison: 06/28/2017; 06/01/2016; 01/31/2016; 04/25/2015

CLINICAL DATA: Evaluate thoracic aortic aneurysm.

EXAM:
CT ANGIOGRAPHY CHEST WITH CONTRAST
TECHNIQUE: Multidetector CT imaging of the chest was performed using the
standard protocol during bolus administration of intravenous
contrast. Multiplanar CT image reconstructions and MIPs were
obtained to evaluate the vascular anatomy.
CONTRAST:  100mL OMNIPAQUE IOHEXOL 350 MG/ML SOLN

[Series 4: aorta 3.0 bf37 2 · axial · 0.64mm/px · z∈[-290,-52]mm · 13 of 93 slices shown]
[im 7/93  lung]
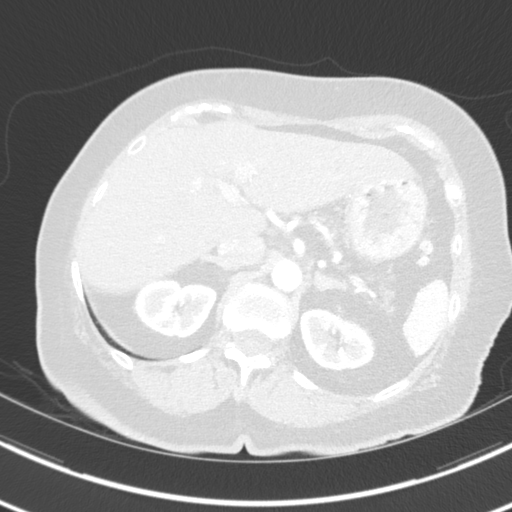
[im 14/93  soft-tissue]
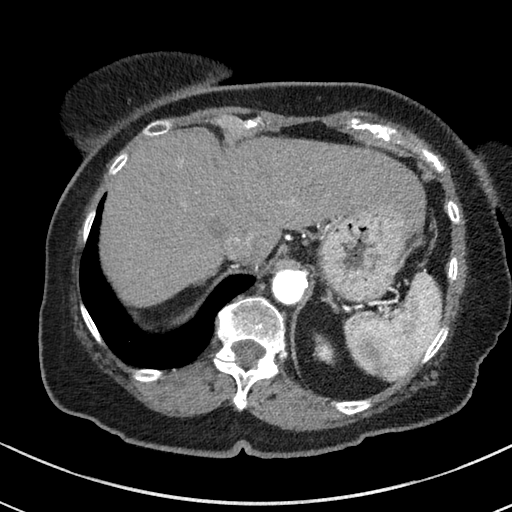
[im 20/93  lung]
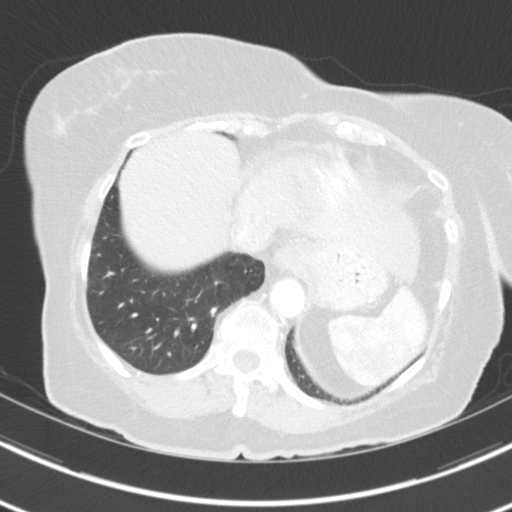
[im 27/93  soft-tissue]
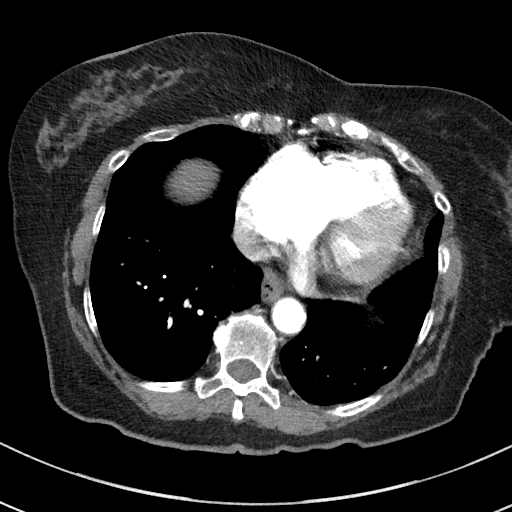
[im 33/93  lung]
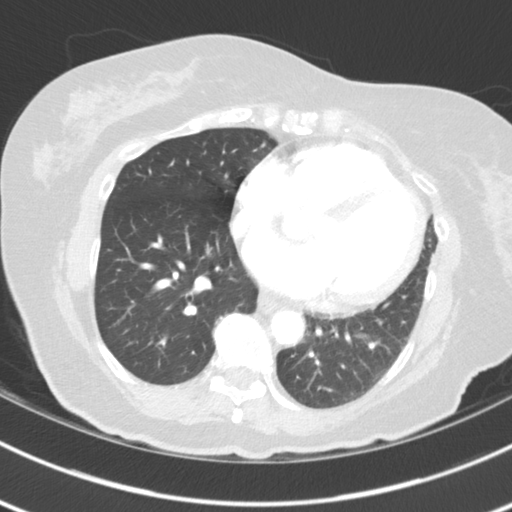
[im 40/93  soft-tissue]
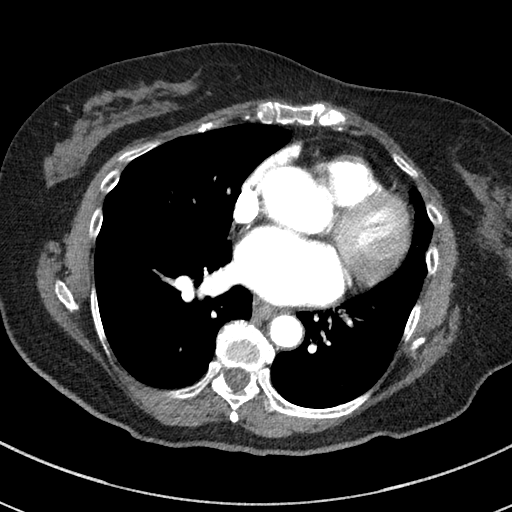
[im 47/93  lung]
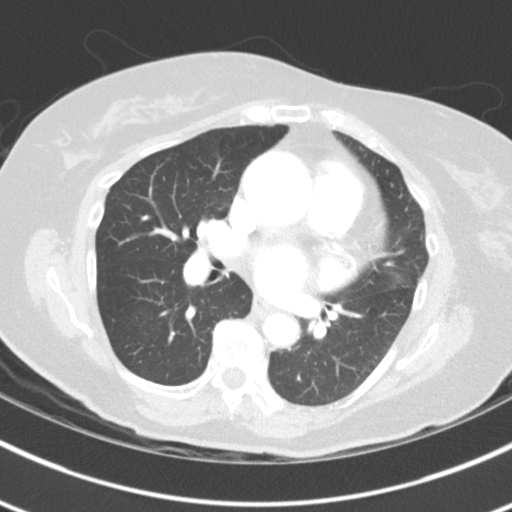
[im 53/93  soft-tissue]
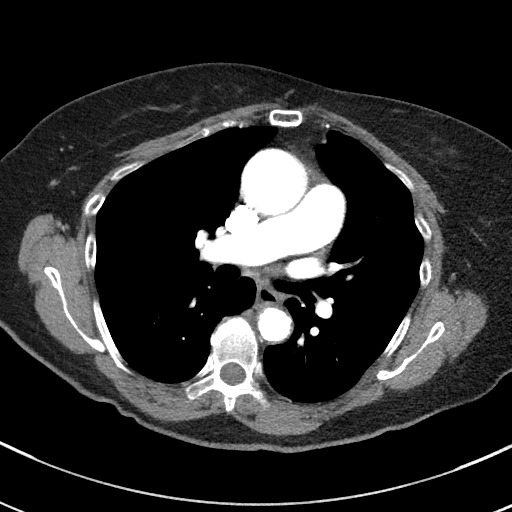
[im 60/93  lung]
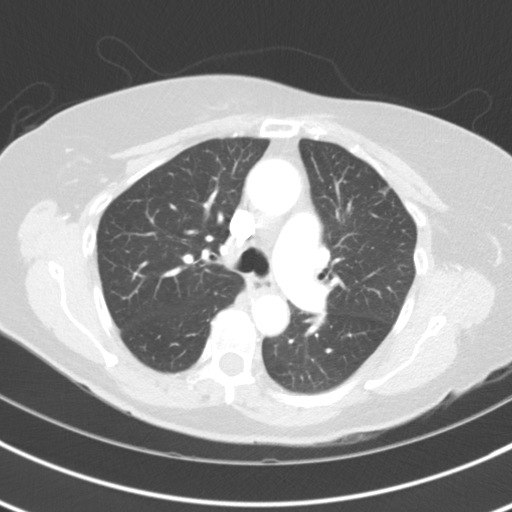
[im 66/93  soft-tissue]
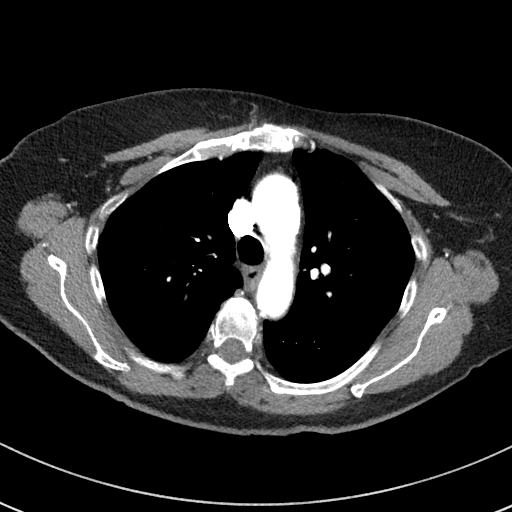
[im 73/93  lung]
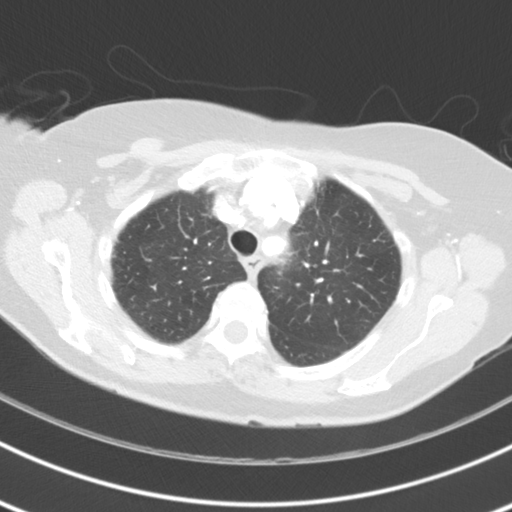
[im 79/93  soft-tissue]
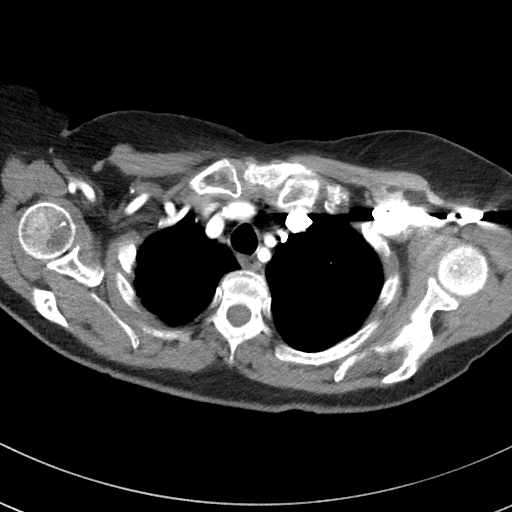
[im 86/93  lung]
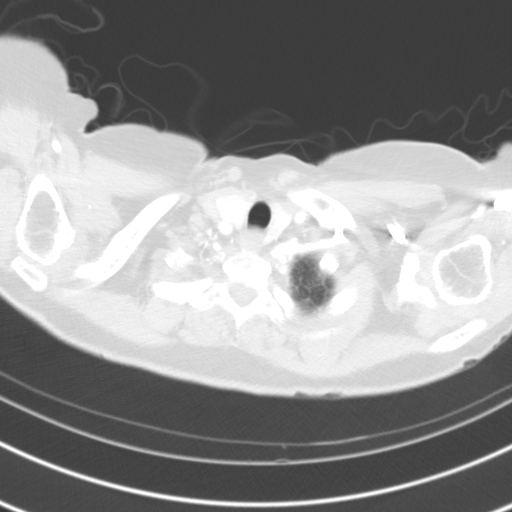

[Series 7: coronals · coronal · 0.57mm/px · 3 of 111 slices shown]
[im 28/111  soft-tissue]
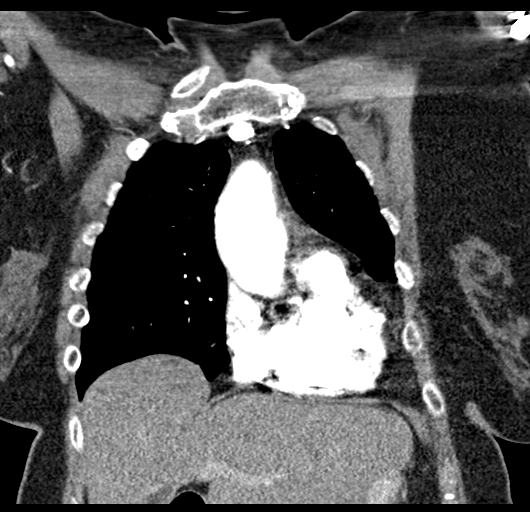
[im 56/111  soft-tissue]
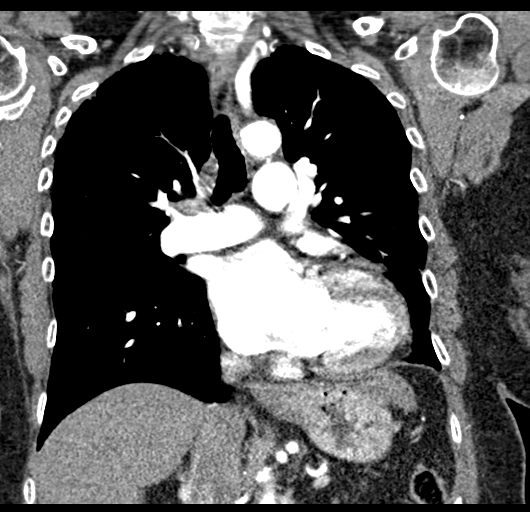
[im 83/111  soft-tissue]
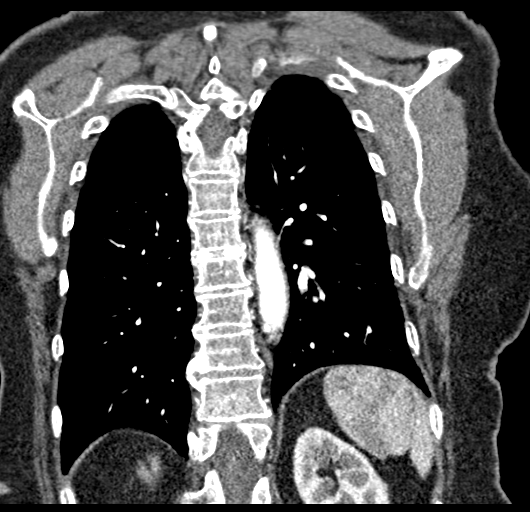

[16 of 46 positions shown; findings below may reference images not displayed]

FINDINGS: Vascular Findings:

Mild fusiform aneurysmal dilatation of the ascending thoracic aorta
with measurements as follows. The thoracic aorta tapers to a normal
caliber at the level of the aortic arch. No evidence of thoracic
aortic dissection or periaortic stranding on this nongated
examination.

Moderate amount eccentric predominantly calcified atherosclerotic
plaque involves the aortic arch and descending thoracic aorta, not
resulting in hemodynamically significant stenosis. The descending
thoracic aorta is of normal caliber.

Conventional configuration of the aortic arch. The branch vessels of
the aortic arch are tortuous though appear widely patent without a
hemodynamically significant narrowing.

Cardiomegaly.  No pericardial effusion.

Although this examination was not tailored for the evaluation the
pulmonary arteries, there are no discrete filling defects within the
central pulmonary arterial tree to suggest central pulmonary
embolism. Enlarged caliber of the main pulmonary artery measuring 32
mm in diameter.

-------------------------------------------------------------

Thoracic aortic measurements:

Sinotubular junction

34 mm as measured in greatest oblique short axis coronal dimension.

Proximal ascending aorta

41 mm as measured in greatest oblique short axis axial dimension at
the level of the main pulmonary artery (image 40, series 4),
approximately 43 mm in greatest oblique short axis coronal diameter
(coronal image 31, series 7) and approximately 44 mm in greatest
oblique short axis sagittal diameter (image 75, series 8), grossly
unchanged compared to the 4791 examination.

Aortic arch aorta

23 mm as measured in greatest oblique short axis sagittal dimension.

Proximal descending thoracic aorta

22 mm as measured in greatest oblique short axis axial dimension at
the level of the main pulmonary artery.

Distal descending thoracic aorta

22 mm as measured in greatest oblique short axis axial dimension at
the level of the diaphragmatic hiatus.

Review of the MIP images confirms the above findings.

-------------------------------------------------------------

Non-Vascular Findings:

Mediastinum/Lymph Nodes: No bulky mediastinal, hilar or axillary
lymphadenopathy.

Lungs/Pleura: Minimal biapical interstitial thickening, similar to
the 4791 examination. No discrete focal airspace opacities. No
pleural effusion or pneumothorax. The central pulmonary airways
appear widely patent. No discrete pulmonary nodules.

Upper abdomen: Limited early arterial phase evaluation of the upper
abdomen is normal.

Musculoskeletal: No acute or aggressive osseous abnormalities.
Stigmata of dish within the thoracic spine. Note is made of a
approximately 1.0 cm hypoattenuating nodule within the
posterosuperior aspect the right lobe of the thyroid (image 4,
series 4), similar to the 4791 examination and of doubtful clinical
concern. Regional soft tissues appear normal.
IMPRESSION: 1. Stable uncomplicated fusiform aneurysmal dilatation of the
ascending thoracic aorta measuring 44 mm in diameter, unchanged
compared to the 4791 examination. Recommend annual imaging followup
by CTA or MRA. This recommendation follows 5252
ACCF/AHA/AATS/ACR/ASA/SCA/BOULANGGER/BELABED/DON LOLITO/TAUFEEQ Guidelines for the
Diagnosis and Management of Patients with Thoracic Aortic Disease.
Circulation. 5252; 121: E266-e369. Aortic aneurysm NOS (9LCGZ-J0E.5)
2. Cardiomegaly with enlargement of the caliber of the main
pulmonary artery, nonspecific though could be seen in the setting of
pulmonary arterial hypertension. Further evaluation with cardiac
echo could be performed as clinically indicated.
3. Aortic Atherosclerosis (9LCGZ-MMH.H).

## 2021-09-14 ENCOUNTER — Ambulatory Visit: Payer: Medicare PPO | Admitting: Cardiology

## 2021-10-20 ENCOUNTER — Other Ambulatory Visit: Payer: Self-pay | Admitting: Cardiology

## 2021-10-30 ENCOUNTER — Other Ambulatory Visit: Payer: Self-pay | Admitting: Cardiology

## 2021-11-02 ENCOUNTER — Other Ambulatory Visit: Payer: Self-pay | Admitting: Physician Assistant

## 2021-11-22 ENCOUNTER — Ambulatory Visit: Payer: Medicare PPO | Admitting: Podiatry

## 2021-12-25 ENCOUNTER — Other Ambulatory Visit: Payer: Self-pay | Admitting: Cardiology

## 2021-12-29 ENCOUNTER — Ambulatory Visit: Payer: Medicare PPO | Admitting: Podiatry

## 2021-12-29 MED ORDER — HYDRALAZINE HCL 10 MG PO TABS: 10.0000 mg | ORAL_TABLET | Freq: Three times a day (TID) | ORAL | 1 refills | Status: DC

## 2022-01-01 ENCOUNTER — Emergency Department (HOSPITAL_COMMUNITY): Payer: Medicare PPO

## 2022-01-01 ENCOUNTER — Other Ambulatory Visit: Payer: Self-pay

## 2022-01-01 ENCOUNTER — Inpatient Hospital Stay (HOSPITAL_COMMUNITY)
Admission: EM | Admit: 2022-01-01 | Discharge: 2022-01-03 | DRG: 281 | Disposition: A | Discharge status: Home Health | Payer: Medicare PPO | Attending: Family Medicine | Admitting: Family Medicine

## 2022-01-01 ENCOUNTER — Encounter (HOSPITAL_COMMUNITY): Payer: Self-pay

## 2022-01-01 DIAGNOSIS — I7121 Aneurysm of the ascending aorta, without rupture: Secondary | ICD-10-CM | POA: Diagnosis present

## 2022-01-01 DIAGNOSIS — Z888 Allergy status to other drugs, medicaments and biological substances status: Secondary | ICD-10-CM

## 2022-01-01 DIAGNOSIS — E7800 Pure hypercholesterolemia, unspecified: Secondary | ICD-10-CM | POA: Diagnosis present

## 2022-01-01 DIAGNOSIS — E1169 Type 2 diabetes mellitus with other specified complication: Secondary | ICD-10-CM

## 2022-01-01 DIAGNOSIS — I252 Old myocardial infarction: Secondary | ICD-10-CM | POA: Diagnosis present

## 2022-01-01 DIAGNOSIS — Z8679 Personal history of other diseases of the circulatory system: Secondary | ICD-10-CM

## 2022-01-01 DIAGNOSIS — Z841 Family history of disorders of kidney and ureter: Secondary | ICD-10-CM

## 2022-01-01 DIAGNOSIS — I4891 Unspecified atrial fibrillation: Secondary | ICD-10-CM

## 2022-01-01 DIAGNOSIS — K219 Gastro-esophageal reflux disease without esophagitis: Secondary | ICD-10-CM | POA: Diagnosis present

## 2022-01-01 DIAGNOSIS — I2510 Atherosclerotic heart disease of native coronary artery without angina pectoris: Secondary | ICD-10-CM | POA: Diagnosis not present

## 2022-01-01 DIAGNOSIS — K589 Irritable bowel syndrome without diarrhea: Secondary | ICD-10-CM | POA: Diagnosis present

## 2022-01-01 DIAGNOSIS — I10 Essential (primary) hypertension: Secondary | ICD-10-CM | POA: Diagnosis not present

## 2022-01-01 DIAGNOSIS — I340 Nonrheumatic mitral (valve) insufficiency: Secondary | ICD-10-CM | POA: Diagnosis present

## 2022-01-01 DIAGNOSIS — R000 Tachycardia, unspecified: Secondary | ICD-10-CM | POA: Diagnosis not present

## 2022-01-01 DIAGNOSIS — I5032 Chronic diastolic (congestive) heart failure: Secondary | ICD-10-CM | POA: Diagnosis not present

## 2022-01-01 DIAGNOSIS — Z85038 Personal history of other malignant neoplasm of large intestine: Secondary | ICD-10-CM | POA: Diagnosis not present

## 2022-01-01 DIAGNOSIS — I110 Hypertensive heart disease with heart failure: Secondary | ICD-10-CM | POA: Diagnosis present

## 2022-01-01 DIAGNOSIS — J929 Pleural plaque without asbestos: Secondary | ICD-10-CM | POA: Diagnosis not present

## 2022-01-01 DIAGNOSIS — E785 Hyperlipidemia, unspecified: Secondary | ICD-10-CM | POA: Insufficient documentation

## 2022-01-01 DIAGNOSIS — Z833 Family history of diabetes mellitus: Secondary | ICD-10-CM

## 2022-01-01 DIAGNOSIS — E612 Magnesium deficiency: Secondary | ICD-10-CM | POA: Diagnosis present

## 2022-01-01 DIAGNOSIS — Z882 Allergy status to sulfonamides status: Secondary | ICD-10-CM | POA: Diagnosis not present

## 2022-01-01 DIAGNOSIS — I160 Hypertensive urgency: Secondary | ICD-10-CM | POA: Diagnosis present

## 2022-01-01 DIAGNOSIS — I341 Nonrheumatic mitral (valve) prolapse: Secondary | ICD-10-CM | POA: Diagnosis present

## 2022-01-01 DIAGNOSIS — Z9849 Cataract extraction status, unspecified eye: Secondary | ICD-10-CM

## 2022-01-01 DIAGNOSIS — Z8711 Personal history of peptic ulcer disease: Secondary | ICD-10-CM

## 2022-01-01 DIAGNOSIS — I700 Atherosclerosis of aorta: Secondary | ICD-10-CM | POA: Diagnosis not present

## 2022-01-01 DIAGNOSIS — Z885 Allergy status to narcotic agent status: Secondary | ICD-10-CM

## 2022-01-01 DIAGNOSIS — Z794 Long term (current) use of insulin: Secondary | ICD-10-CM

## 2022-01-01 DIAGNOSIS — I712 Thoracic aortic aneurysm, without rupture, unspecified: Secondary | ICD-10-CM | POA: Diagnosis not present

## 2022-01-01 DIAGNOSIS — I214 Non-ST elevation (NSTEMI) myocardial infarction: Secondary | ICD-10-CM | POA: Diagnosis present

## 2022-01-01 DIAGNOSIS — E1140 Type 2 diabetes mellitus with diabetic neuropathy, unspecified: Secondary | ICD-10-CM

## 2022-01-01 DIAGNOSIS — Z8041 Family history of malignant neoplasm of ovary: Secondary | ICD-10-CM

## 2022-01-01 DIAGNOSIS — Z880 Allergy status to penicillin: Secondary | ICD-10-CM | POA: Diagnosis not present

## 2022-01-01 DIAGNOSIS — Z8673 Personal history of transient ischemic attack (TIA), and cerebral infarction without residual deficits: Secondary | ICD-10-CM

## 2022-01-01 DIAGNOSIS — Z955 Presence of coronary angioplasty implant and graft: Secondary | ICD-10-CM

## 2022-01-01 DIAGNOSIS — F419 Anxiety disorder, unspecified: Secondary | ICD-10-CM | POA: Diagnosis present

## 2022-01-01 DIAGNOSIS — Z79899 Other long term (current) drug therapy: Secondary | ICD-10-CM | POA: Diagnosis not present

## 2022-01-01 DIAGNOSIS — Z961 Presence of intraocular lens: Secondary | ICD-10-CM | POA: Diagnosis present

## 2022-01-01 DIAGNOSIS — I480 Paroxysmal atrial fibrillation: Secondary | ICD-10-CM | POA: Diagnosis present

## 2022-01-01 DIAGNOSIS — E119 Type 2 diabetes mellitus without complications: Secondary | ICD-10-CM

## 2022-01-01 DIAGNOSIS — I499 Cardiac arrhythmia, unspecified: Secondary | ICD-10-CM | POA: Diagnosis not present

## 2022-01-01 DIAGNOSIS — E1151 Type 2 diabetes mellitus with diabetic peripheral angiopathy without gangrene: Secondary | ICD-10-CM | POA: Diagnosis present

## 2022-01-01 DIAGNOSIS — T68XXXA Hypothermia, initial encounter: Secondary | ICD-10-CM | POA: Diagnosis not present

## 2022-01-01 DIAGNOSIS — Z800 Family history of malignant neoplasm of digestive organs: Secondary | ICD-10-CM

## 2022-01-01 DIAGNOSIS — Z8249 Family history of ischemic heart disease and other diseases of the circulatory system: Secondary | ICD-10-CM

## 2022-01-01 DIAGNOSIS — K449 Diaphragmatic hernia without obstruction or gangrene: Secondary | ICD-10-CM | POA: Diagnosis not present

## 2022-01-01 DIAGNOSIS — R7989 Other specified abnormal findings of blood chemistry: Secondary | ICD-10-CM

## 2022-01-01 DIAGNOSIS — R0602 Shortness of breath: Secondary | ICD-10-CM | POA: Diagnosis not present

## 2022-01-01 DIAGNOSIS — K5730 Diverticulosis of large intestine without perforation or abscess without bleeding: Secondary | ICD-10-CM | POA: Diagnosis not present

## 2022-01-01 LAB — CBC
HCT: 36.8 % (ref 36.0–46.0)
Hemoglobin: 11.8 g/dL — ABNORMAL LOW (ref 12.0–15.0)
MCH: 24.8 pg — ABNORMAL LOW (ref 26.0–34.0)
MCHC: 32.1 g/dL (ref 30.0–36.0)
MCV: 77.3 fL — ABNORMAL LOW (ref 80.0–100.0)
Platelets: 225 10*3/uL (ref 150–400)
RBC: 4.76 MIL/uL (ref 3.87–5.11)
RDW: 16.1 % — ABNORMAL HIGH (ref 11.5–15.5)
WBC: 8.4 10*3/uL (ref 4.0–10.5)
nRBC: 0 % (ref 0.0–0.2)

## 2022-01-01 LAB — APTT: aPTT: 26 seconds (ref 24–36)

## 2022-01-01 LAB — BASIC METABOLIC PANEL
Anion gap: 7 (ref 5–15)
BUN: 21 mg/dL (ref 8–23)
CO2: 25 mmol/L (ref 22–32)
Calcium: 9.2 mg/dL (ref 8.9–10.3)
Chloride: 108 mmol/L (ref 98–111)
Creatinine, Ser: 0.89 mg/dL (ref 0.44–1.00)
GFR, Estimated: >60 mL/min (ref >60)
Glucose, Bld: 207 mg/dL — ABNORMAL HIGH (ref 70–99)
Potassium: 3.5 mmol/L (ref 3.5–5.1)
Sodium: 140 mmol/L (ref 135–145)

## 2022-01-01 LAB — PROTIME-INR
INR: 1.1 (ref 0.8–1.2)
Prothrombin Time: 14 seconds (ref 11.4–15.2)

## 2022-01-01 LAB — TSH: TSH: 0.899 u[IU]/mL (ref 0.350–4.500)

## 2022-01-01 LAB — MAGNESIUM: Magnesium: 1.6 mg/dL — ABNORMAL LOW (ref 1.7–2.4)

## 2022-01-01 LAB — BRAIN NATRIURETIC PEPTIDE: B Natriuretic Peptide: 234.2 pg/mL — ABNORMAL HIGH (ref 0.0–100.0)

## 2022-01-01 LAB — TROPONIN I (HIGH SENSITIVITY): Troponin I (High Sensitivity): 132 ng/L (ref <18)

## 2022-01-01 MED ORDER — LACTATED RINGERS IV BOLUS
1000.0000 mL | Freq: Once | INTRAVENOUS | Status: AC
  Administered 2022-01-02: 1000 mL via INTRAVENOUS

## 2022-01-01 MED ORDER — APIXABAN 5 MG PO TABS
5.0000 mg | ORAL_TABLET | Freq: Two times a day (BID) | ORAL | Status: DC
  Administered 2022-01-02: 5 mg via ORAL
  Filled 2022-01-01: qty 1

## 2022-01-01 MED ORDER — ACETAMINOPHEN 500 MG PO TABS
1000.0000 mg | ORAL_TABLET | Freq: Once | ORAL | Status: AC
  Administered 2022-01-02: 1000 mg via ORAL
  Filled 2022-01-01: qty 2

## 2022-01-01 NOTE — ED Triage Notes (Signed)
Pt arrives via [facility] from home for chest pain that started approx 40 min prior to EMS arrival. Denies SOB.

## 2022-01-01 NOTE — ED Provider Notes (Signed)
[place] EMERGENCY DEPARTMENT Provider Note   CSN: [id number] Arrival date & time: 01/01/22  2137     History {Add pertinent medical, surgical, social history, OB history to HPI:1} Chief Complaint  Patient presents with   Chest Pain    KELSI BENHAM is a 88 y.o. female.   Chest Pain  The patient is an 88 year old female with past medical history of hypertension, diabetes, and stable aortic aneurysm, presenting by EMS for left arm pain and found to be in A-fib.  The patient developed acute onset of pain in her left arm radiating from her shoulder to her hand approximately 40 minutes prior to arrival.  She called EMS and on their arrival she was found to be in atrial fibrillation with a heart rate of 154.  She does not have a history of atrial fibrillation and is not on anticoagulation.  Patient was given 10 of Cardizem and route which improved her rate to 120s-130s.  On arrival, she is currently endorsing improvement in her left arm pain with mild residual pain in her left hand.  She states that her husband has been in the hospital for the past few days and just was discharged yesterday.  She states that she has been under considerable amount of stress and has not been eating much over the past few days.  She denies chest pain, shortness of breath, fever, nausea, vomiting, diarrhea, abdominal pain.     Home Medications Prior to Admission medications   Medication Sig Start Date End Date Taking? Authorizing Provider  ACCU-CHEK GUIDE test strip  02/23/21   [provider]  acetaminophen (TYLENOL) 500 MG tablet Take 500 mg by mouth every 6 (six) hours as needed for mild pain, moderate pain, fever or headache.    [provider]  Cholecalciferol (VITAMIN D-3) 1000 UNITS CAPS Take 3 capsules by mouth in the morning.    [provider]  furosemide (LASIX) 20 MG tablet TAKE ONE TAB EVERY MON, WED, FRI. TAKE ONE ADDITIONAL TAB IF WEIGHT GAIN OF 3+  LBS OVERNIGHT 11/02/21   Sueanne Margarita, MD  hydrALAZINE (APRESOLINE) 10 MG tablet Take 1 tablet (10 mg total) by mouth 3 (three) times daily. 12/29/21   Sueanne Margarita, MD  hydrocortisone (ANUSOL-HC) 2.5 % rectal cream Apply to perianal area 3 times daily. Patient taking differently: Place rectally as needed for hemorrhoids or anal itching. Apply to perianal area 3 times daily. 11/14/17   Zehr, Janett Billow D, PA-C  insulin glargine (LANTUS) 100 UNIT/ML Solostar Pen Inject 20 Units into the skin daily with breakfast.    [provider]  Insulin Pen Needle (BD PEN NEEDLE NANO 2ND GEN) 32G X 4 MM MISC See admin instructions.    [provider]  Multiple Vitamin (MULTIVITAMIN) tablet Take 1 tablet by mouth daily.      [provider]  pantoprazole (PROTONIX) 40 MG tablet TAKE 1 TABLET BY MOUTH EVERY DAY BEFORE BREAKFAST 02/03/20   Gatha Mayer, MD  polyethylene glycol (MIRALAX / GLYCOLAX) 17 g packet Take 17 g by mouth daily as needed for mild constipation. 07/02/21   Raiford Noble Latif, DO  spironolactone (ALDACTONE) 25 MG tablet TAKE 1/2 TABLET BY MOUTH EVERY DAY 12/29/21   Sueanne Margarita, MD  trandolapril (MAVIK) 4 MG tablet TAKE 1 TABLET BY MOUTH 2 TIMES DAILY. 08/16/21   Sueanne Margarita, MD      Allergies    Actos [pioglitazone], Avandia [rosiglitazone], Fosamax [alendronate sodium],  Morphine, Ultram [tramadol], Glimepiride, Januvia [sitagliptin], Lipitor [atorvastatin], Metformin and related, Prandin [repaglinide], Tradjenta [linagliptin], Zoloft [sertraline hcl], Aspirin, Bentyl [dicyclomine hcl], Ciprofloxacin, Dicyclomine, Hctz [hydrochlorothiazide], Keflex [cephalexin], Morphine and related, Norvasc [amlodipine besylate], Oxycodone, Sulfa antibiotics, Sulfamethoxazole-trimethoprim, and Penicillins    Review of Systems   Review of Systems  Cardiovascular:  Positive for chest pain.    Physical Exam Updated Vital Signs There were no vitals taken for this  visit. Physical Exam Vitals and nursing note reviewed.  Constitutional:      General: She is not in acute distress.    Appearance: She is well-developed.  HENT:     Head: Normocephalic and atraumatic.  Eyes:     Conjunctiva/sclera: Conjunctivae normal.  Cardiovascular:     Rate and Rhythm: Tachycardia present. Rhythm irregular.     Heart sounds: No murmur heard. Pulmonary:     Effort: Pulmonary effort is normal. No respiratory distress.     Breath sounds: Normal breath sounds.  Abdominal:     Palpations: Abdomen is soft.     Tenderness: There is no abdominal tenderness.  Musculoskeletal:        General: No swelling.     Cervical back: Neck supple.  Skin:    General: Skin is warm and dry.     Capillary Refill: Capillary refill takes less than 2 seconds.  Neurological:     General: No focal deficit present.     Mental Status: She is alert and oriented to person, place, and time.     Cranial Nerves: No cranial nerve deficit.     Motor: No weakness.  Psychiatric:        Mood and Affect: Mood normal.     ED Results / Procedures / Treatments   Labs (all labs ordered are listed, but only abnormal results are displayed) Labs Reviewed - No data to display  EKG None  Radiology No results found.  Procedures Procedures  {Document cardiac monitor, telemetry assessment procedure when appropriate:1}  Medications Ordered in ED Medications - No data to display  ED Course/ Medical Decision Making/ A&P                           Medical Decision Making  The patient is an 88 year old female with past medical history of hypertension, diabetes, and stable aortic aneurysm, presenting by EMS for left arm pain and found to be in A-fib.  The differential diagnosis considered includes: Dehydration, electrolyte abnormality, congestive heart failure, ACS, infection.  On arrival, the patient was hypertensive with a blood pressure of 176/90 and heart rate in the 120s-130s in atrial  fibrillation.  There were no significant findings on physical exam.  I personally reviewed and independently interpreted all labs, images, and EKGs included the patient's workup.  The patient's diagnostic workup included an EKG which showed atrial fibrillation with RVR.  Her laboratory workup included serial troponins, the initial was elevated to 132; BNP elevated to 234.2; CBC with white blood cell count of 8.4 and hemoglobin of 11.8; PT/INR and PTT which were within normal limits; magnesium 1.6; BMP with glucose elevated 207, creatinine of 0.89 with BUN of 21.  She received a chest x-ray which showed no signs of pleural effusion or volume overload.  Her CHA2DS2-VASc was calculated at 6.  While in the emergency department patient was started on IV fluids and given a dose of Tylenol for her hand pain.  The patient was admitted to the hospital service for further  management of new onset A-fib.  Amount and/or Complexity of Data Reviewed Independent Historian: EMS External Data Reviewed: labs, radiology, ECG and notes. Labs: ordered. Decision-making details documented in ED Course. Radiology: ordered and independent interpretation performed. Decision-making details documented in ED Course. ECG/medicine tests: ordered and independent interpretation performed. Decision-making details documented in ED Course.  Risk OTC drugs.   Patient's presentation is most consistent with acute presentation with potential threat to life or bodily function.   {Document critical care time when appropriate:1} {Document review of labs and clinical decision tools ie heart score, Chads2Vasc2 etc:1}  {Document your independent review of radiology images, and any outside records:1} {Document your discussion with family members, caretakers, and with consultants:1} {Document social determinants of health affecting pt's care:1} {Document your decision making why or why not admission, treatments were needed:1} Final  Clinical Impression(s) / ED Diagnoses Final diagnoses:  None    Rx / DC Orders ED Discharge Orders     None

## 2022-01-02 ENCOUNTER — Encounter (HOSPITAL_COMMUNITY): Payer: Self-pay | Admitting: Family Medicine

## 2022-01-02 ENCOUNTER — Observation Stay (HOSPITAL_COMMUNITY): Payer: Medicare PPO

## 2022-01-02 ENCOUNTER — Other Ambulatory Visit (HOSPITAL_COMMUNITY): Payer: Medicare PPO

## 2022-01-02 DIAGNOSIS — E1151 Type 2 diabetes mellitus with diabetic peripheral angiopathy without gangrene: Secondary | ICD-10-CM | POA: Diagnosis present

## 2022-01-02 DIAGNOSIS — I214 Non-ST elevation (NSTEMI) myocardial infarction: Secondary | ICD-10-CM | POA: Diagnosis present

## 2022-01-02 DIAGNOSIS — Z961 Presence of intraocular lens: Secondary | ICD-10-CM | POA: Diagnosis present

## 2022-01-02 DIAGNOSIS — Z85038 Personal history of other malignant neoplasm of large intestine: Secondary | ICD-10-CM | POA: Diagnosis not present

## 2022-01-02 DIAGNOSIS — I5032 Chronic diastolic (congestive) heart failure: Secondary | ICD-10-CM | POA: Diagnosis present

## 2022-01-02 DIAGNOSIS — I4891 Unspecified atrial fibrillation: Secondary | ICD-10-CM | POA: Diagnosis present

## 2022-01-02 DIAGNOSIS — I252 Old myocardial infarction: Secondary | ICD-10-CM | POA: Diagnosis present

## 2022-01-02 DIAGNOSIS — Z8679 Personal history of other diseases of the circulatory system: Secondary | ICD-10-CM | POA: Diagnosis not present

## 2022-01-02 DIAGNOSIS — I160 Hypertensive urgency: Secondary | ICD-10-CM | POA: Diagnosis present

## 2022-01-02 DIAGNOSIS — Z9849 Cataract extraction status, unspecified eye: Secondary | ICD-10-CM | POA: Diagnosis not present

## 2022-01-02 DIAGNOSIS — Z880 Allergy status to penicillin: Secondary | ICD-10-CM | POA: Diagnosis not present

## 2022-01-02 DIAGNOSIS — Z888 Allergy status to other drugs, medicaments and biological substances status: Secondary | ICD-10-CM | POA: Diagnosis not present

## 2022-01-02 DIAGNOSIS — I700 Atherosclerosis of aorta: Secondary | ICD-10-CM | POA: Diagnosis not present

## 2022-01-02 DIAGNOSIS — K449 Diaphragmatic hernia without obstruction or gangrene: Secondary | ICD-10-CM | POA: Diagnosis not present

## 2022-01-02 DIAGNOSIS — I110 Hypertensive heart disease with heart failure: Secondary | ICD-10-CM | POA: Diagnosis present

## 2022-01-02 DIAGNOSIS — I10 Essential (primary) hypertension: Secondary | ICD-10-CM | POA: Diagnosis not present

## 2022-01-02 DIAGNOSIS — K589 Irritable bowel syndrome without diarrhea: Secondary | ICD-10-CM | POA: Diagnosis present

## 2022-01-02 DIAGNOSIS — Z882 Allergy status to sulfonamides status: Secondary | ICD-10-CM | POA: Diagnosis not present

## 2022-01-02 DIAGNOSIS — E785 Hyperlipidemia, unspecified: Secondary | ICD-10-CM | POA: Insufficient documentation

## 2022-01-02 DIAGNOSIS — I2510 Atherosclerotic heart disease of native coronary artery without angina pectoris: Secondary | ICD-10-CM | POA: Diagnosis not present

## 2022-01-02 DIAGNOSIS — R7989 Other specified abnormal findings of blood chemistry: Secondary | ICD-10-CM | POA: Diagnosis present

## 2022-01-02 DIAGNOSIS — Z955 Presence of coronary angioplasty implant and graft: Secondary | ICD-10-CM | POA: Diagnosis not present

## 2022-01-02 DIAGNOSIS — Z79899 Other long term (current) drug therapy: Secondary | ICD-10-CM | POA: Diagnosis not present

## 2022-01-02 DIAGNOSIS — I340 Nonrheumatic mitral (valve) insufficiency: Secondary | ICD-10-CM | POA: Diagnosis present

## 2022-01-02 DIAGNOSIS — I7121 Aneurysm of the ascending aorta, without rupture: Secondary | ICD-10-CM | POA: Diagnosis present

## 2022-01-02 DIAGNOSIS — K219 Gastro-esophageal reflux disease without esophagitis: Secondary | ICD-10-CM | POA: Diagnosis present

## 2022-01-02 DIAGNOSIS — I480 Paroxysmal atrial fibrillation: Secondary | ICD-10-CM | POA: Diagnosis present

## 2022-01-02 DIAGNOSIS — Z794 Long term (current) use of insulin: Secondary | ICD-10-CM | POA: Diagnosis not present

## 2022-01-02 DIAGNOSIS — K5730 Diverticulosis of large intestine without perforation or abscess without bleeding: Secondary | ICD-10-CM | POA: Diagnosis not present

## 2022-01-02 DIAGNOSIS — Z833 Family history of diabetes mellitus: Secondary | ICD-10-CM | POA: Diagnosis not present

## 2022-01-02 DIAGNOSIS — E7800 Pure hypercholesterolemia, unspecified: Secondary | ICD-10-CM | POA: Diagnosis present

## 2022-01-02 DIAGNOSIS — Z885 Allergy status to narcotic agent status: Secondary | ICD-10-CM | POA: Diagnosis not present

## 2022-01-02 DIAGNOSIS — F419 Anxiety disorder, unspecified: Secondary | ICD-10-CM | POA: Diagnosis present

## 2022-01-02 DIAGNOSIS — J929 Pleural plaque without asbestos: Secondary | ICD-10-CM | POA: Diagnosis not present

## 2022-01-02 DIAGNOSIS — I712 Thoracic aortic aneurysm, without rupture, unspecified: Secondary | ICD-10-CM | POA: Diagnosis not present

## 2022-01-02 LAB — LIPID PANEL
Cholesterol: 144 mg/dL (ref 0–200)
HDL: 32 mg/dL — ABNORMAL LOW (ref >40)
LDL Cholesterol: 92 mg/dL (ref 0–99)
Total CHOL/HDL Ratio: 4.5 RATIO
Triglycerides: 101 mg/dL (ref <150)
VLDL: 20 mg/dL (ref 0–40)

## 2022-01-02 LAB — CBC
HCT: 34.3 % — ABNORMAL LOW (ref 36.0–46.0)
Hemoglobin: 10.9 g/dL — ABNORMAL LOW (ref 12.0–15.0)
MCH: 24.7 pg — ABNORMAL LOW (ref 26.0–34.0)
MCHC: 31.8 g/dL (ref 30.0–36.0)
MCV: 77.6 fL — ABNORMAL LOW (ref 80.0–100.0)
Platelets: 202 10*3/uL (ref 150–400)
RBC: 4.42 MIL/uL (ref 3.87–5.11)
RDW: 16.5 % — ABNORMAL HIGH (ref 11.5–15.5)
WBC: 7.3 10*3/uL (ref 4.0–10.5)
nRBC: 0 % (ref 0.0–0.2)

## 2022-01-02 LAB — CBG MONITORING, ED
Glucose-Capillary: 95 mg/dL (ref 70–99)
Glucose-Capillary: 201 mg/dL — ABNORMAL HIGH (ref 70–99)
Glucose-Capillary: 141 mg/dL — ABNORMAL HIGH (ref 70–99)
Glucose-Capillary: 133 mg/dL — ABNORMAL HIGH (ref 70–99)

## 2022-01-02 LAB — BASIC METABOLIC PANEL
Anion gap: 7 (ref 5–15)
BUN: 20 mg/dL (ref 8–23)
CO2: 25 mmol/L (ref 22–32)
Calcium: 8.8 mg/dL — ABNORMAL LOW (ref 8.9–10.3)
Chloride: 109 mmol/L (ref 98–111)
Creatinine, Ser: 0.8 mg/dL (ref 0.44–1.00)
GFR, Estimated: >60 mL/min (ref >60)
Glucose, Bld: 97 mg/dL (ref 70–99)
Potassium: 4 mmol/L (ref 3.5–5.1)
Sodium: 141 mmol/L (ref 135–145)

## 2022-01-02 LAB — TROPONIN I (HIGH SENSITIVITY)
Troponin I (High Sensitivity): 2092 ng/L (ref <18)
Troponin I (High Sensitivity): 3162 ng/L (ref <18)
Troponin I (High Sensitivity): 2858 ng/L (ref <18)

## 2022-01-02 LAB — GLUCOSE, CAPILLARY: Glucose-Capillary: 92 mg/dL (ref 70–99)

## 2022-01-02 LAB — APTT: aPTT: 63 seconds — ABNORMAL HIGH (ref 24–36)

## 2022-01-02 LAB — MAGNESIUM: Magnesium: 1.6 mg/dL — ABNORMAL LOW (ref 1.7–2.4)

## 2022-01-02 MED ORDER — INSULIN GLARGINE-YFGN 100 UNIT/ML ~~LOC~~ SOLN
14.0000 [IU] | Freq: Every day | SUBCUTANEOUS | Status: DC
  Administered 2022-01-02 – 2022-01-03 (×2): 14 [IU] via SUBCUTANEOUS
  Filled 2022-01-02 (×2): qty 0.14

## 2022-01-02 MED ORDER — CARVEDILOL 3.125 MG PO TABS: 6.2500 mg | ORAL_TABLET | Freq: Two times a day (BID) | ORAL | Status: DC

## 2022-01-02 MED ORDER — IOHEXOL 350 MG/ML SOLN
100.0000 mL | Freq: Once | INTRAVENOUS | Status: AC | PRN
  Administered 2022-01-02: 80 mL via INTRAVENOUS

## 2022-01-02 MED ORDER — INSULIN ASPART 100 UNIT/ML IJ SOLN
0.0000 [IU] | Freq: Three times a day (TID) | INTRAMUSCULAR | Status: DC
  Administered 2022-01-02: 2 [IU] via SUBCUTANEOUS

## 2022-01-02 MED ORDER — POTASSIUM CHLORIDE CRYS ER 20 MEQ PO TBCR
40.0000 meq | EXTENDED_RELEASE_TABLET | Freq: Once | ORAL | Status: AC
  Administered 2022-01-02: 40 meq via ORAL
  Filled 2022-01-02: qty 2

## 2022-01-02 MED ORDER — INSULIN ASPART 100 UNIT/ML IJ SOLN: 0.0000 [IU] | Freq: Every day | INTRAMUSCULAR | Status: DC

## 2022-01-02 MED ORDER — EZETIMIBE 10 MG PO TABS
10.0000 mg | ORAL_TABLET | Freq: Every day | ORAL | Status: DC
  Administered 2022-01-02 – 2022-01-03 (×2): 10 mg via ORAL
  Filled 2022-01-02 (×2): qty 1

## 2022-01-02 MED ORDER — HYDRALAZINE HCL 10 MG PO TABS
10.0000 mg | ORAL_TABLET | Freq: Three times a day (TID) | ORAL | Status: DC
  Administered 2022-01-02: 10 mg via ORAL
  Filled 2022-01-02: qty 1

## 2022-01-02 MED ORDER — FUROSEMIDE 20 MG PO TABS
20.0000 mg | ORAL_TABLET | ORAL | Status: DC
  Administered 2022-01-03: 20 mg via ORAL
  Filled 2022-01-02: qty 1

## 2022-01-02 MED ORDER — ONDANSETRON HCL 4 MG PO TABS: 4.0000 mg | ORAL_TABLET | Freq: Four times a day (QID) | ORAL | Status: DC | PRN

## 2022-01-02 MED ORDER — MAGNESIUM SULFATE 2 GM/50ML IV SOLN
2.0000 g | Freq: Once | INTRAVENOUS | Status: AC
  Administered 2022-01-02: 2 g via INTRAVENOUS
  Filled 2022-01-02: qty 50

## 2022-01-02 MED ORDER — HYDRALAZINE HCL 25 MG PO TABS
25.0000 mg | ORAL_TABLET | Freq: Three times a day (TID) | ORAL | Status: DC
  Administered 2022-01-02 – 2022-01-03 (×3): 25 mg via ORAL
  Filled 2022-01-02 (×3): qty 1

## 2022-01-02 MED ORDER — CARVEDILOL 6.25 MG PO TABS
6.2500 mg | ORAL_TABLET | Freq: Two times a day (BID) | ORAL | Status: DC
  Administered 2022-01-02 – 2022-01-03 (×2): 6.25 mg via ORAL
  Filled 2022-01-02 (×2): qty 1

## 2022-01-02 MED ORDER — ASPIRIN 81 MG PO CHEW
324.0000 mg | CHEWABLE_TABLET | Freq: Once | ORAL | Status: DC
  Filled 2022-01-02: qty 4

## 2022-01-02 MED ORDER — POLYETHYLENE GLYCOL 3350 17 G PO PACK: 17.0000 g | PACK | Freq: Every day | ORAL | Status: DC | PRN

## 2022-01-02 MED ORDER — ONDANSETRON HCL 4 MG/2ML IJ SOLN: 4.0000 mg | Freq: Four times a day (QID) | INTRAMUSCULAR | Status: DC | PRN

## 2022-01-02 MED ORDER — LABETALOL HCL 5 MG/ML IV SOLN
10.0000 mg | INTRAVENOUS | Status: DC | PRN
  Administered 2022-01-02 – 2022-01-03 (×2): 10 mg via INTRAVENOUS
  Filled 2022-01-02 (×2): qty 4

## 2022-01-02 MED ORDER — TRANDOLAPRIL 1 MG PO TABS
4.0000 mg | ORAL_TABLET | Freq: Two times a day (BID) | ORAL | Status: DC
  Administered 2022-01-02 – 2022-01-03 (×3): 4 mg via ORAL
  Filled 2022-01-02 (×4): qty 4

## 2022-01-02 MED ORDER — ACETAMINOPHEN 325 MG PO TABS
650.0000 mg | ORAL_TABLET | Freq: Four times a day (QID) | ORAL | Status: DC | PRN
  Administered 2022-01-02 – 2022-01-03 (×3): 650 mg via ORAL
  Filled 2022-01-02 (×3): qty 2

## 2022-01-02 MED ORDER — PANTOPRAZOLE SODIUM 40 MG PO TBEC
40.0000 mg | DELAYED_RELEASE_TABLET | Freq: Every day | ORAL | Status: DC
  Administered 2022-01-02 – 2022-01-03 (×2): 40 mg via ORAL
  Filled 2022-01-02 (×2): qty 1

## 2022-01-02 MED ORDER — SPIRONOLACTONE 12.5 MG HALF TABLET
12.5000 mg | ORAL_TABLET | Freq: Every day | ORAL | Status: DC
  Administered 2022-01-02 – 2022-01-03 (×2): 12.5 mg via ORAL
  Filled 2022-01-02 (×2): qty 1

## 2022-01-02 MED ORDER — HEPARIN (PORCINE) 25000 UT/250ML-% IV SOLN
1050.0000 [IU]/h | INTRAVENOUS | Status: DC
  Administered 2022-01-02: 950 [IU]/h via INTRAVENOUS
  Filled 2022-01-02: qty 250

## 2022-01-02 MED ORDER — ACETAMINOPHEN 650 MG RE SUPP: 650.0000 mg | Freq: Four times a day (QID) | RECTAL | Status: DC | PRN

## 2022-01-02 MED ORDER — SODIUM CHLORIDE 0.9% FLUSH
3.0000 mL | Freq: Two times a day (BID) | INTRAVENOUS | Status: DC
  Administered 2022-01-02 (×3): 3 mL via INTRAVENOUS

## 2022-01-02 NOTE — Assessment & Plan Note (Signed)
This is converted to sinus.  CHA2DS2-Vasc 8.    Has converted to sinus already.  Eliquis given last night. - Hold Eliquis until dissection ruled out

## 2022-01-02 NOTE — Progress Notes (Signed)
Progress Note   Patient: [name] JME:[id number] DOB: 1932-05-19 DOA: 01/01/2022     0 DOS: the patient was seen and examined on 01/02/2022 at 9:40 AM      Brief hospital course: [name] is an 88 y.o. F with HTN, DM, HLD, hx AAA, PVD, hx TIA, and hx cerebral aneurysm s/p coiling years ago who presented with acute chest pain, left arm pain and tachycardia.  [name] with dementia was recently released from the hospital and she has been working hard taking care of him.  The night of admission she was cleaning up after dinner when she had sudden onset of severe chest and left arm pain.  Lay down, took her blood pressure and heart rate which were extremely high, the pain did not abate, so she called 911.  In the ER, EKG showed atrial fibrillation in the 130s.  Chest x-ray clear.  Troponin 130.   12/2: Started on apixaban and IV diltiazem and converted to sinus 12/3: hsTrop increased to 2000 then 3000, cardiology consulted     Assessment and Plan: * Atrial fibrillation with RVR ([place]) This is converted to sinus.  CHA2DS2-Vasc 8.    Has converted to sinus already.  Eliquis given last night. - Hold Eliquis until dissection ruled out     Elevated troponin Unclear if this is related to Afib (most likely), dissection or NSTEMI.  Patient refused aspirin. - Trend troponin - Obtain echo - Consult cardiology    Ascending aortic aneurysm (HCC) 4.4 cm ascending aortic aneurysm.  Probably not dissection given pain abated mostly in the ER.  But on my exam, this is her chief concern, and so until dissection ruled out, need to hold [facility] - Obtain stat CTA chest - Hold Eliquis for now, if CTA negative for dissec, will start heparin until cardiology can evaluate - If CTA positive, will need much more aggressive BP lowering    Hyperlipidemia Intolerant to Lipitor.  DM (diabetes mellitus) (HCC) A1c 6.1% on home glargine - Continue glargine - Start sliding scale  corrections  Essential hypertension Blood pressure elevated - Continue furosemide, spironolactone, trandolapril - Hold hydralazine for now -Start as needed labetalol for elevated blood pressure          Subjective: Patient's chest pain is improved, she has minimal pain if anything.  Her heart racing is also resolved.  She has no overt dyspnea, confusion, fever, cough.     Physical Exam: BP (!) 197/85   Pulse 72   Temp 97.6 F (36.4 C) (Oral)   Resp (!) 25   Ht '5\' 3"'$  (1.6 m)   Wt 67.1 kg   SpO2 97%   BMI 26.22 kg/m   Elderly adult female, lying in bed, no acute distress, interactive and appropriate RRR, no murmurs, she has nonpitting lower extremity edema which looks chronic, no elevated JVP Respiratory rate normal, lungs clear without rales or wheezes Abdomen soft with mild tenderness palpation over the bladder Attention normal, affect appropriate, moves upper extremities with normal strength and coordination, speech fluent, oriented to person, place, and time    Data Reviewed: Discussed with cardiology PA Basic metabolic panel shows normal renal function, low magnesium Troponins rising Complete blood count shows mild anemia, microcytic ECG shows A-fib Chest x-ray clear    Family Communication: None present    Disposition: Status is: Observation         Author: Edwin Dada, MD 01/02/2022 10:02 AM  For on call review [email or web address].

## 2022-01-02 NOTE — ED Notes (Signed)
This RN is taking pt to CT

## 2022-01-02 NOTE — Assessment & Plan Note (Signed)
>>  ASSESSMENT AND PLAN FOR ATRIAL FIBRILLATION WITH RVR WRITTEN ON 01/02/2022  9:58 AM BY DANFORD, CHRISTOPHER P, MD  This is converted to sinus.  CHA2DS2-Vasc 8.    Has converted to sinus already.  Eliquis given last night. - Hold Eliquis until dissection ruled out

## 2022-01-02 NOTE — ED Notes (Signed)
Pt back from CT

## 2022-01-02 NOTE — Assessment & Plan Note (Signed)
Blood pressure elevated - Continue furosemide, spironolactone, trandolapril - Hold hydralazine for now -Start as needed labetalol for elevated blood pressure

## 2022-01-02 NOTE — ED Notes (Signed)
NP at bedside.

## 2022-01-02 NOTE — Assessment & Plan Note (Signed)
Intolerant to Lipitor.

## 2022-01-02 NOTE — Consult Note (Addendum)
Cardiology Consultation   Patient ID: [name] MRN: [id number]; DOB: 12-22-1932  Admit date: 01/01/2022 Date of Consult: 01/02/2022  PCP:  Antony Contras, MD   [facility] Cardiologist:  Fransico Him, MD   {   Patient Profile:   [name] is a 88 y.o. female with a hx of hypertension, type 2 diabetes, peptic ulcer disease, carotid artery disease, chronic diastolic heart failure, ascending thoracic aortic aneurysm, mitral valve prolapse, mitral regurgitation, remote cerebral aneurysm s/p coiling, chronic chest pain, colon cancer s/p resection, pulmonary nodule, who is being seen 01/02/2022 for the evaluation of A-fib RVR at the request of Dr. Loleta Books.  History of Present Illness:   Ms. Valek with above past medical history presented to the ER yesterday complaining chest discomfort and heart palpitation.  She had reported that she is under a lot of stress associated with taking care of her husband who has dementia.  Her husband was released from the hospital yesterday.  She was walking around the house getting things ready.  Around 7 PM, she developed midsternal burning sensation, that is similar to her chronic chest pain, and new onset of pain of her left arm.  She felt she was having a heart attack. She has never had similar feelings in the past. She states her symptom resolved after EMS arrived and gave ger IV cardizem. She is currently chest pain free. She does not have A fib in the past. She states Dr Radford Pax did want to do stress test with her after last one in 2017 but this did not happen. She is taking all of her meds, states her blood pressure can go up with emotional stress, denied any rapid weight gain.  She has chronic leg edema that worsens with ambulation and improves with resting and elevation.  She follows Dr. Radford Pax outpatient for cardiology reportedly has chronic chest pain and shortness of breath, most recent stress Myoview was done on 05/18/2015  revealed normal LVEF, low risk study, no ischemia.  Remote cardiac catheterization 2002 revealed normal coronary arteries.    She suffers chronic diastolic heart failure.  Most recent echo from 07/01/2021 with LVEF 60 to 65%, mild concentric LVH, normal RV, trivial MR, trivial AR, borderline aortic root dilatation 37 mm and moderate ascending aorta dilatation 41 mm.  She was most recently hospitalized for acute diastolic heart failure 04/07/6576 - 07/02/21 in the setting of hypertensive urgency with SBP 190s.  She was diuresed with IV Lasix and transitioned to as needed Lasix 20 mg daily at the time of discharge.   Event monitor from 07/2017 revealed sinus rhythm with occasional PVCs.  She was last seen by APP on 07/13/2021 at the office, continued to have chronic chest pain on and off without change in quality.  She had improved lower extremity edema, she continued to have shortness of breath with minimal activity.  She was advised to take Lasix 20 mg daily with additional dose as needed when acute CHF symptoms occurs.  She was continued on spironolactone 12.5 mg daily, Entresto was felt not ideal and SGLT2 inhibitor was considered in the future.  Her antihypertensive regimen included Lasix 20 mg daily, hydralazine 10 mg 3 times daily, spironolactone 12.5 mg daily,trandolapril 4 mg BID.   Admission diagnostic since admission revealed elevated glucose 207, otherwise grossly unremarkable BMP.  Magnesium deficient 1.6 >1.6.  CBC revealed hemoglobin 11.8 >10.9, otherwise unremarkable.  INR 1.1.  BNP elevated to 34.  High sensitive troponin 132 >2092 >  3162.  TSH WNL, chest x-ray revealed no acute finding.  Initial EKG on 01/01/2022 showed atrial fibrillation with RVR at 137 bpm, with associated generalized ST depression.  EKG today at 0012 revealedInitial EKG from 01/01/2022 at 2143 sinus rhythm at 72 bpm, PAC, T wave inversion of lead III and aVF, new comparing to EKG from 06/30/2021.  She was admitted to hospital  medicine service, given IV diltiazem for A-fib rate control and started on Eliquis for anticoagulation.  Etiology is consulted today given new onset A-fib as well as uptrending troponin.  Echocardiogram is pending at this time.    Past Medical History:  Diagnosis Date   Abdominal pain, chronic, left lower quadrant    Acute torn meniscus of knee    Adenomatous colon polyp 1970   carcinoma in situ   Allergic rhinitis    Amaurosis fugax 08/07/2012   Anxiety    Ascending aortic aneurysm ([place]) 12/07/2015   59m by chest CTA 08/2020   Benign essential tremor syndrome    Bicuspid aortic valve    no AS on 07/2019   Carotid stenosis    1039 bilateral by dopplers 03/2017.    colon ca dx'd 1970   surg only   DDD (degenerative disc disease)    Diverticulitis    Diverticulosis    DM (diabetes mellitus) ([place]    Duodenitis    peptic, with gastric heterotopia   Endometriosis    s/p hysterectomy   Fundic gland polyposis of stomach    GERD (gastroesophageal reflux disease)    Heart murmur    Hiatal hernia 02/03/2005   History of cerebral aneurysm repair    s/p coiling   History of hemorrhoids    with bleeding   History of shingles    HLD (hyperlipidemia)    HTN (hypertension)    Hypercholesteremia    IBS (irritable bowel syndrome)    Iron deficiency    Lung nodule 07/12/2021   CT 05/2021: 3 mm right solid pulmonary nodule-no routine follow-up imaging recommended   Migraine    MVP (mitral valve prolapse)    Osteopenia    Peripheral neuropathy    Toe fracture, right    second toe   UTI (lower urinary tract infection)    Varicose vein     Past Surgical History:  Procedure Laterality Date   ABDOMINAL HYSTERECTOMY     APPENDECTOMY     CARPAL TUNNEL RELEASE  08/26/07   CATARACT EXTRACTION     CEREBRAL ANEURYSM REPAIR     COLON RESECTION     COLONOSCOPY  06/07/08   divertiulosis, internal hemorrhoids   COLONOSCOPY W/ BIOPSIES AND POLYPECTOMY  02/03/05   diverticulosis, 4 mm sessile  polyps, internal and external hemorrhoids   CORONARY ANGIOPLASTY WITH STENT PLACEMENT     ESOPHAGOGASTRODUODENOSCOPY  02/03/05   hiatal hernia, 6 benign gastric polyps   FLEXIBLE SIGMOIDOSCOPY     HAND SURGERY Right    INTRAOCULAR LENS INSERTION     right hand decompressive fasciotomy  08/26/07   , dorsal and volar   TONSILLECTOMY       Home Medications:  Prior to Admission medications   Medication Sig Start Date End Date Taking? Authorizing Provider  ACCU-CHEK GUIDE test strip  02/23/21   [provider]  acetaminophen (TYLENOL) 500 MG tablet Take 500 mg by mouth every 6 (six) hours as needed for mild pain, moderate pain, fever or headache.    [provider]  Cholecalciferol (VITAMIN D-3) 1000 UNITS CAPS  Take 3 capsules by mouth in the morning.    [provider]  furosemide (LASIX) 20 MG tablet TAKE ONE TAB EVERY MON, WED, FRI. TAKE ONE ADDITIONAL TAB IF WEIGHT GAIN OF 3+ LBS OVERNIGHT 11/02/21   Sueanne Margarita, MD  hydrALAZINE (APRESOLINE) 10 MG tablet Take 1 tablet (10 mg total) by mouth 3 (three) times daily. 12/29/21   Sueanne Margarita, MD  hydrocortisone (ANUSOL-HC) 2.5 % rectal cream Apply to perianal area 3 times daily. Patient taking differently: Place rectally as needed for hemorrhoids or anal itching. Apply to perianal area 3 times daily. 11/14/17   Zehr, Janett Billow D, PA-C  insulin glargine (LANTUS) 100 UNIT/ML Solostar Pen Inject 20 Units into the skin daily with breakfast.    [provider]  Insulin Pen Needle (BD PEN NEEDLE NANO 2ND GEN) 32G X 4 MM MISC See admin instructions.    [provider]  Multiple Vitamin (MULTIVITAMIN) tablet Take 1 tablet by mouth daily.      [provider]  pantoprazole (PROTONIX) 40 MG tablet TAKE 1 TABLET BY MOUTH EVERY DAY BEFORE BREAKFAST 02/03/20   Gatha Mayer, MD  polyethylene glycol (MIRALAX / GLYCOLAX) 17 g packet Take 17 g by mouth daily as needed for mild constipation. 07/02/21   Raiford Noble Latif, DO  spironolactone (ALDACTONE) 25 MG tablet TAKE 1/2 TABLET BY MOUTH EVERY DAY 12/29/21   Sueanne Margarita, MD  trandolapril (MAVIK) 4 MG tablet TAKE 1 TABLET BY MOUTH 2 TIMES DAILY. 08/16/21   Sueanne Margarita, MD    Inpatient Medications: Scheduled Meds:  aspirin  324 mg Oral Once   carvedilol  6.25 mg Oral BID WC   ezetimibe  10 mg Oral Daily   [START ON 01/03/2022] furosemide  20 mg Oral Q M,W,F   hydrALAZINE  10 mg Oral TID   insulin aspart  0-5 Units Subcutaneous QHS   insulin aspart  0-6 Units Subcutaneous TID WC   insulin glargine-yfgn  14 Units Subcutaneous Daily   pantoprazole  40 mg Oral Daily   sodium chloride flush  3 mL Intravenous Q12H   spironolactone  12.5 mg Oral Daily   trandolapril  4 mg Oral BID   Continuous Infusions:  heparin     PRN Meds: acetaminophen **OR** acetaminophen, labetalol, ondansetron **OR** ondansetron (ZOFRAN) IV, polyethylene glycol  Allergies:    Allergies  Allergen Reactions   Actos [Pioglitazone]     Chronic  Pedal edema:contraindication   Avandia [Rosiglitazone]     Chronic pedal edema:contraindication   Fosamax [Alendronate Sodium]     unknown   Morphine Other (See Comments)   Ultram [Tramadol] Shortness Of Breath and Palpitations   Glimepiride     hypersensitivity   Januvia [Sitagliptin]     hypersensitivity   Lipitor [Atorvastatin]     Causes muscle pain and swelling   Metformin And Related     gastritis   Prandin [Repaglinide]     Abdominal pain: side effects   Tradjenta [Linagliptin]     GI problems, hypersensitivity   Zoloft [Sertraline Hcl]     Jittery or zombie like   Aspirin Other (See Comments)    Bleeding ulcers    Bentyl [Dicyclomine Hcl]     Came close to passing out. weak   Ciprofloxacin Nausea And Vomiting    Severe stomach upset   Dicyclomine     Other reaction(s): Unknown   Hctz [Hydrochlorothiazide] Other (See Comments)    URINARY ISSUES   Keflex [  Cephalexin]    Morphine And Related  Other (See Comments)    Body Spasms   Norvasc [Amlodipine Besylate]     Weak and dizzy.    Oxycodone    Sulfa Antibiotics Hives   Sulfamethoxazole-Trimethoprim     Other reaction(s): hives   Penicillins Swelling and Rash    Amoxicillin causes stomach upset.    Social History:   Social History   Socioeconomic History   Marital status: Married    Spouse name: Charles   Number of children: 0   Years of education: Not on file   Highest education level: Not on file  Occupational History   Occupation: retired    Fish farm manager: RETIRED  Tobacco Use   Smoking status: Never   Smokeless tobacco: Never  Vaping Use   Vaping Use: Never used  Substance and Sexual Activity   Alcohol use: No   Drug use: No   Sexual activity: Not on file  Other Topics Concern   Not on file  Social History Narrative   Lives with husband    Right handed   Caffeine: 2 c of coffee a day   Social Determinants of Radio broadcast assistant Strain: Not on file  Food Insecurity: Not on file  Transportation Needs: Not on file  Physical Activity: Not on file  Stress: Not on file  Social Connections: Not on file  Intimate Partner Violence: Not on file    Family History:    Family History  Problem Relation Age of Onset   Ovarian cancer Mother    Heart disease Father    Kidney disease Father    Diabetes Paternal Grandmother    Diabetes Brother    Hypertension Brother    Heart disease Brother    Diabetes Brother    Heart disease Brother    Microcephaly Brother    Diabetes Other    Colon cancer Other      ROS:  Constitutional: Denied fever, chills, malaise, night sweats Eyes: Denied vision change or loss Ears/Nose/Mouth/Throat: Denied ear ache, sore throat, coughing, sinus pain Cardiovascular: see HPI  Respiratory: see HPI  Gastrointestinal: Denied nausea, vomiting, abdominal pain, diarrhea Genital/Urinary: Is not UTI Musculoskeletal: Denied muscle ache, joint pain, weakness Skin: Denied  rash, wound Neuro: Denied headache, dizziness, syncope Psych: history of anxiety  Endocrine: history of diabetes .     Physical Exam/Data:   Vitals:   01/02/22 0200 01/02/22 0300 01/02/22 0720 01/02/22 0800  BP: (!) 146/75 (!) 157/68 (!) 119/101 (!) 197/85  Pulse: 63 64 69 72  Resp: 17 (!) 28 (!) 25 (!) 25  Temp:   97.6 F (36.4 C)   TempSrc:   Oral   SpO2: 97% 96% 97% 97%  Weight:      Height:       No intake or output data in the 24 hours ending 01/02/22 1056    01/01/2022    9:47 PM 07/13/2021   10:50 AM 06/30/2021   10:09 PM  Last 3 Weights  Weight (lbs) 148 lb 147 lb 9.6 oz 151 lb 7.3 oz  Weight (kg) 67.132 kg 66.951 kg 68.7 kg     Body mass index is 26.22 kg/m.  Vitals:  Vitals:   01/02/22 0720 01/02/22 0800  BP: (!) 119/101 (!) 197/85  Pulse: 69 72  Resp: (!) 25 (!) 25  Temp: 97.6 F (36.4 C)   SpO2: 97% 97%   General Appearance: In no apparent distress, laying in bed HEENT: Normocephalic, atraumatic.  Neck: Supple, trachea midline, no JVDs Cardiovascular: Regular rate and rhythm, normal U7-O5, systolic murmur grade II LUSB Respiratory: Resting breathing unlabored, lungs sounds clear to auscultation bilaterally, no use of accessory muscles. On room air.  No wheezes, rales or rhonchi.   Gastrointestinal: Bowel sounds positive, abdomen soft Extremities: Able to move all extremities in bed without difficulty, nonpitting ankle edema bilaterally  Musculoskeletal: Normal muscle bulk and tone Skin: Intact, warm, dry. No rashes or petechiae noted in exposed areas.  Neurologic: Alert, oriented to person, place and time. Fluent speech, no cognitive deficit Psychiatric: Anxious       EKG:  The EKG was personally reviewed and demonstrates:    Initial EKG on 01/01/2022 showed atrial fibrillation with RVR at 137 bpm, with associated generalized ST depression.    EKG today at 0012 revealedInitial EKG from 01/01/2022 at 2143 sinus rhythm at 72 bpm, PAC, T wave  inversion of lead III and aVF, new comparing to EKG from 06/30/2021.   Telemetry:  Telemetry was personally reviewed and demonstrates:    A-fib RVR 130s, converted at 10:41 PM on 01/01/2022 to sinus rhythm occasional PVCs  Relevant CV Studies:  Echocardiogram from 07/01/2021   1. Left ventricular ejection fraction, by estimation, is 60 to 65%. The  left ventricle has normal function. The left ventricle has no regional  wall motion abnormalities. There is mild concentric left ventricular  hypertrophy. Left ventricular diastolic  parameters were normal.   2. Right ventricular systolic function is normal. The right ventricular  size is normal.   3. The mitral valve is normal in structure. Trivial mitral valve  regurgitation. No evidence of mitral stenosis.   4. The aortic valve is tricuspid. Aortic valve regurgitation is trivial.  No aortic stenosis is present.   5. There is borderline dilatation of the aortic root, measuring 37 mm.  There is moderate dilatation of the ascending aorta, measuring 41 mm.   6. The inferior vena cava is normal in size with greater than 50%  respiratory variability, suggesting right atrial pressure of 3 mmHg.   Carotid Doppler 01/16/2018:  Summary:  Right Carotid: Velocities in the right ICA are consistent with a 1-39%  stenosis.   Left Carotid: Velocities in the left ICA are consistent with a 1-39%  stenosis.   Vertebrals: Bilateral vertebral arteries demonstrate antegrade flow.   *See table(s) above for measurements and observations.   Cardiac event monitor from 08/24/2017:  Normal rhythm with occasional PVCs   Stress Myoview from 05/18/2015:  The left ventricular ejection fraction is normal (55-65%). Nuclear stress EF: 71%. There was no ST segment deviation noted during stress. The study is normal. This is a low risk study.   Normal resting and stress perfusion no ischemia or infarction EF 64%     Laboratory Data:  High Sensitivity  Troponin:   Recent Labs  Lab 01/01/22 2220 01/02/22 0232 01/02/22 0817 01/02/22 0918  TROPONINIHS 132* 2,092* 3,162* 2,858*     Chemistry Recent Labs  Lab 01/01/22 2220 01/02/22 0232  NA 140 141  K 3.5 4.0  CL 108 109  CO2 25 25  GLUCOSE 207* 97  BUN 21 20  CREATININE 0.89 0.80  CALCIUM 9.2 8.8*  MG 1.6* 1.6*  GFRNONAA >60 >60  ANIONGAP 7 7    No results for input(s): "PROT", "ALBUMIN", "AST", "ALT", "ALKPHOS", "BILITOT" in the last 168 hours. Lipids No results for input(s): "CHOL", "TRIG", "HDL", "LABVLDL", "LDLCALC", "CHOLHDL" in the last 168 hours.  Hematology Recent Labs  Lab 01/01/22 2220 01/02/22 0918  WBC 8.4 7.3  RBC 4.76 4.42  HGB 11.8* 10.9*  HCT 36.8 34.3*  MCV 77.3* 77.6*  MCH 24.8* 24.7*  MCHC 32.1 31.8  RDW 16.1* 16.5*  PLT 225 202   Thyroid  Recent Labs  Lab 01/01/22 2220  TSH 0.899    BNP Recent Labs  Lab 01/01/22 2220  BNP 234.2*    DDimer No results for input(s): "DDIMER" in the last 168 hours.   Radiology/Studies:  DG Chest 2 View  Result Date: 01/01/2022 CLINICAL DATA:  Atrial fibrillation EXAM: CHEST - 2 VIEW COMPARISON:  07/02/2021 FINDINGS: The heart size and mediastinal contours are within normal limits. Both lungs are clear. The visualized skeletal structures are unremarkable. IMPRESSION: No active cardiopulmonary disease. Electronically Signed   By: Ulyses Jarred M.D.   On: 01/01/2022 23:05     Assessment and Plan:   Non-STEMI -Presented with chest discomfort , in the onset setting of A-fib RVR -High sensitive troponin 132> 2092>3162 >2858 -EKG initially revealed A-fib RVR, now with conversion to sinus rhythm, new TWI of lead II and aVF  -She had remote negative ischemic evaluation in 2002 cardiac cath and 2017 stress Myoview  - Echo is pending, will follow -Possible demand ischemia from A-fib RVR and hypertensive urgency, unable to rule out ischemic etiology, discussed cardiac catheterization, patient is not willing to  accept the potential risk and wishes conservative medical therapy, she is currently chest pain-free and spontaneously converted from A-fib RVR to sinus rhythm, will defer further discussion to Dr. Lovena Le with the patient on final recommendation -Medical therapy: Will initiate carvedilol 6.'25mg'$  BID, no aspirin given the initiation of Eliquis and peptic ulcer disease, intolerant to statin due to muscle ache/will start Zetia, continue PTA trandolapril -Check A1c, lipid panel, TSH WNL  Paroxysmal atrial fibrillation with RVR, new diagnosis - per initial EKG and telemetry -TSH WNL -Spontaneously converted to sinus rhythm, did receive IV diltiazem 10 mg at ED -Recommend to initiate rate control with carvedilol 6.25 mg twice daily -Given CHA2DS2-VASc score of 7, agree with anticoagulation with Eliquis  Hypertensive urgency -Blood pressure has been elevated up to 197/85 since admission -PTA Lasix 20 mg daily, hydralazine 10 mg 3 times daily, spironolactone 12.5 mg daily, trandolapril 4 mg twice daily -Will add coreg as above, uptitrating medication until blood pressure control is at goal  Chronic diastolic heart failure - BNP elevated 234 - CXR no acute finding - Echo pending  - clinically euvolemic today - recommend continue PTA lasix, spironolactone and trandolapril, add carvedilol as above - goal is to aim ideal blood pressure control  Ascending thoracic aortic aneurysm - last CTA chest 06/30/21 unchanged 4.4 cm ascending aortic aneurysm  - repeat CTA chest pending today rule out dissection  Type 2 diabetes Peptic ulcer disease Anemia Anxiety History of cerebral aneurysm status post coiling -Per internal medicine    Risk Assessment/Risk Scores:   TIMI Risk Score for Unstable Angina or Non-ST Elevation MI:   The patient's TIMI risk score is 3, which indicates a 13% risk of all cause mortality, new or recurrent myocardial infarction or need for urgent revascularization in the next 14  days.{   New York Heart Association (NYHA) Functional Class NYHA Class II  CHA2DS2-VASc Score = 7   This indicates a 11.2% annual risk of stroke. The patient's score is based upon: CHF History: 1 HTN History: 1 Diabetes History: 1 Stroke History: 0 Vascular Disease History: 1 Age Score: 2 Gender  Score: 1    For questions or updates, please contact [facility] Please consult [email or web address] for contact info under    Signed, Margie Billet, NP  01/02/2022 10:56 AM  Cardiology Attending  Patient seen and examined. Agree with above. The patient is better. She presents with atrial fib (new) with a RVR and angina. She has reverted back to NSR and feels better. Her troponins are 3000. She had lateral ST depression. She is not inclined for invasive evaluation at this time. She has been started on IV heparin. If her 2D echo shows normal LV function, I would favor outpatient non-invasive eval unless the patient changes her mind. If her EF is down, then left heart cath would be warranted. Her NSTEMI could all be supply/demand from her rapid atrial fib.  Carleene Overlie Aalyah Mansouri,MD

## 2022-01-02 NOTE — Progress Notes (Addendum)
[facility] for heparin Indication: chest pain/ACS  Heparin Dosing Weight: 66 kg  Labs: Recent Labs    01/01/22 2220 01/02/22 0232  HGB 11.8*  --   HCT 36.8  --   PLT 225  --   APTT 26  --   LABPROT 14.0  --   INR 1.1  --   CREATININE 0.89 0.80    Assessment: 24 yof with hx of ascending thoracic aortic aneurysm, cerebral aneurysm s/p coiling, colon cancer s/p resection presenting with CP and palpitations, elevated troponin, found to be in afib with HR in 150s and started on apixaban in the ER ('5mg'$  dose x1 given so far on 12/3 at 0021). Pharmacy consulted to transition apixaban to heparin for ACS with troponin uptrending. Patient is not on anticoagulation PTA. Hg 11.8, plt WNL. No bleed issues reported.  Goal of Therapy:  Heparin level 0.3-0.7 units/ml aPTT 66-102 seconds Monitor platelets by anticoagulation protocol: Yes   Plan:  D/c apixaban No bolus with recent apixaban. Start heparin infusion at 950 units/hr at 1230 (at time next apixaban dose would have been due) Check 8hr aPTT Monitor aPTT and heparin level daily until correlating, CBC, s/sx bleeding F/u Cardiology plans, transition back to apixaban as appropriate   Arturo Morton, PharmD, BCPS Clinical Pharmacist 01/02/2022 9:32 AM

## 2022-01-02 NOTE — Assessment & Plan Note (Signed)
4.4 cm ascending aortic aneurysm.  Probably not dissection given pain abated mostly in the ER.  But on my exam, this is her chief concern, and so until dissection ruled out, need to hold [facility] - Obtain stat CTA chest - Hold Eliquis for now, if CTA negative for dissec, will start heparin until cardiology can evaluate - If CTA positive, will need much more aggressive BP lowering

## 2022-01-02 NOTE — Hospital Course (Signed)
[name] is an 88 y.o. F with HTN, DM, HLD, hx AAA, PVD, hx TIA, and hx cerebral aneurysm s/p coiling years ago who presented with acute chest pain, left arm pain and tachycardia.  [name] with dementia was recently released from the hospital and she has been working hard taking care of him.  The night of admission she was cleaning up after dinner when she had sudden onset of severe chest and left arm pain.  Lay down, took her blood pressure and heart rate which were extremely high, the pain did not abate, so she called 911.  In the ER, EKG showed atrial fibrillation in the 130s.  Chest x-ray clear.  Troponin 130.   12/2: Started on apixaban and IV diltiazem and converted to sinus 12/3: hsTrop increased to 2000 then 3000, cardiology consulted

## 2022-01-02 NOTE — Assessment & Plan Note (Addendum)
Unclear if this is related to Afib (most likely), dissection or NSTEMI.  Patient refused aspirin. - Trend troponin - Obtain echo - Consult cardiology

## 2022-01-02 NOTE — Assessment & Plan Note (Signed)
A1c 6.1% on home glargine - Continue glargine - Start sliding scale corrections

## 2022-01-02 NOTE — H&P (Signed)
History and Physical    [name] NWG:[id number] DOB: 1932-10-19 DOA: 01/01/2022  PCP: Antony Contras, MD   Patient coming from: Home   Chief Complaint: Chest discomfort, palpitations   HPI: [name] is a 88 y.o. female with medical history significant for hypertension, type 2 diabetes mellitus, chronic HFpEF, ascending aortic aneurysm, and colon cancer that was resected, now presenting to the emergency department with chest discomfort and palpitations.  Patient reports that she has been under a lot of stress related to caring for her husband with dementia who just got out of the hospital.  She had otherwise been in her usual state and was at rest tonight at approximately 7 PM when she developed a burning sensation in her central chest with aching pain in the left arm and rapid palpitations.    She was having difficulty catching her breath and EMS was called.  She was found to be in atrial fibrillation with heart rate in the 150s.  She was given 10 mg IV diltiazem with improvement in heart rate to 120s-130s prior to arrival in the ED.  ED Course: Upon arrival to the ED, patient is found to be afebrile and saturating well on room air with heart rate in the 120s-130s and stable blood pressure.  EKG demonstrates atrial fibrillation with rate 137.  Chest x-ray is negative for acute cardiopulmonary disease.  Blood work notable for glucose 207, magnesium 1.6, troponin 132, and BNP 234.  Patient was treated with acetaminophen and 1 L of LR, and was started on Eliquis in the ED.  Review of Systems:  All other systems reviewed and apart from HPI, are negative.  Past Medical History:  Diagnosis Date   Abdominal pain, chronic, left lower quadrant    Acute torn meniscus of knee    Adenomatous colon polyp 1970   carcinoma in situ   Allergic rhinitis    Amaurosis fugax 08/07/2012   Anxiety    Ascending aortic aneurysm ([place]) 12/07/2015   12m by chest CTA 08/2020   Benign essential  tremor syndrome    Bicuspid aortic valve    no AS on 07/2019   Carotid stenosis    1039 bilateral by dopplers 03/2017.    colon ca dx'd 1970   surg only   DDD (degenerative disc disease)    Diverticulitis    Diverticulosis    DM (diabetes mellitus) ([place]    Duodenitis    peptic, with gastric heterotopia   Endometriosis    s/p hysterectomy   Fundic gland polyposis of stomach    GERD (gastroesophageal reflux disease)    Heart murmur    Hiatal hernia 02/03/2005   History of cerebral aneurysm repair    s/p coiling   History of hemorrhoids    with bleeding   History of shingles    HLD (hyperlipidemia)    HTN (hypertension)    Hypercholesteremia    IBS (irritable bowel syndrome)    Iron deficiency    Lung nodule 07/12/2021   CT 05/2021: 3 mm right solid pulmonary nodule-no routine follow-up imaging recommended   Migraine    MVP (mitral valve prolapse)    Osteopenia    Peripheral neuropathy    Toe fracture, right    second toe   UTI (lower urinary tract infection)    Varicose vein     Past Surgical History:  Procedure Laterality Date   ABDOMINAL HYSTERECTOMY     APPENDECTOMY     CARPAL TUNNEL RELEASE  08/26/07  CATARACT EXTRACTION     CEREBRAL ANEURYSM REPAIR     COLON RESECTION     COLONOSCOPY  06/07/08   divertiulosis, internal hemorrhoids   COLONOSCOPY W/ BIOPSIES AND POLYPECTOMY  02/03/05   diverticulosis, 4 mm sessile polyps, internal and external hemorrhoids   CORONARY ANGIOPLASTY WITH STENT PLACEMENT     ESOPHAGOGASTRODUODENOSCOPY  02/03/05   hiatal hernia, 6 benign gastric polyps   FLEXIBLE SIGMOIDOSCOPY     HAND SURGERY Right    INTRAOCULAR LENS INSERTION     right hand decompressive fasciotomy  08/26/07   , dorsal and volar   TONSILLECTOMY      Social History:   reports that she has never smoked. She has never used smokeless tobacco. She reports that she does not drink alcohol and does not use drugs.  Allergies  Allergen Reactions   Actos [Pioglitazone]      Chronic  Pedal edema:contraindication   Avandia [Rosiglitazone]     Chronic pedal edema:contraindication   Fosamax [Alendronate Sodium]     unknown   Morphine Other (See Comments)   Ultram [Tramadol] Shortness Of Breath and Palpitations   Glimepiride     hypersensitivity   Januvia [Sitagliptin]     hypersensitivity   Lipitor [Atorvastatin]     Causes muscle pain and swelling   Metformin And Related     gastritis   Prandin [Repaglinide]     Abdominal pain: side effects   Tradjenta [Linagliptin]     GI problems, hypersensitivity   Zoloft [Sertraline Hcl]     Jittery or zombie like   Aspirin Other (See Comments)    Bleeding ulcers    Bentyl [Dicyclomine Hcl]     Came close to passing out. weak   Ciprofloxacin Nausea And Vomiting    Severe stomach upset   Dicyclomine     Other reaction(s): Unknown   Hctz [Hydrochlorothiazide] Other (See Comments)    URINARY ISSUES   Keflex [Cephalexin]    Morphine And Related Other (See Comments)    Body Spasms   Norvasc [Amlodipine Besylate]     Weak and dizzy.    Oxycodone    Sulfa Antibiotics Hives   Sulfamethoxazole-Trimethoprim     Other reaction(s): hives   Penicillins Swelling and Rash    Amoxicillin causes stomach upset.    Family History  Problem Relation Age of Onset   Ovarian cancer Mother    Heart disease Father    Kidney disease Father    Diabetes Paternal Grandmother    Diabetes Brother    Hypertension Brother    Heart disease Brother    Diabetes Brother    Heart disease Brother    Microcephaly Brother    Diabetes Other    Colon cancer Other      Prior to Admission medications   Medication Sig Start Date End Date Taking? Authorizing Provider  ACCU-CHEK GUIDE test strip  02/23/21   [provider]  acetaminophen (TYLENOL) 500 MG tablet Take 500 mg by mouth every 6 (six) hours as needed for mild pain, moderate pain, fever or headache.    [provider]  Cholecalciferol (VITAMIN D-3) 1000  UNITS CAPS Take 3 capsules by mouth in the morning.    [provider]  furosemide (LASIX) 20 MG tablet TAKE ONE TAB EVERY MON, WED, FRI. TAKE ONE ADDITIONAL TAB IF WEIGHT GAIN OF 3+ LBS OVERNIGHT 11/02/21   Sueanne Margarita, MD  hydrALAZINE (APRESOLINE) 10 MG tablet Take 1 tablet (10 mg total) by mouth  3 (three) times daily. 12/29/21   Sueanne Margarita, MD  hydrocortisone (ANUSOL-HC) 2.5 % rectal cream Apply to perianal area 3 times daily. Patient taking differently: Place rectally as needed for hemorrhoids or anal itching. Apply to perianal area 3 times daily. 11/14/17   Zehr, Janett Billow D, PA-C  insulin glargine (LANTUS) 100 UNIT/ML Solostar Pen Inject 20 Units into the skin daily with breakfast.    [provider]  Insulin Pen Needle (BD PEN NEEDLE NANO 2ND GEN) 32G X 4 MM MISC See admin instructions.    [provider]  Multiple Vitamin (MULTIVITAMIN) tablet Take 1 tablet by mouth daily.      [provider]  pantoprazole (PROTONIX) 40 MG tablet TAKE 1 TABLET BY MOUTH EVERY DAY BEFORE BREAKFAST 02/03/20   Gatha Mayer, MD  polyethylene glycol (MIRALAX / GLYCOLAX) 17 g packet Take 17 g by mouth daily as needed for mild constipation. 07/02/21   Raiford Noble Latif, DO  spironolactone (ALDACTONE) 25 MG tablet TAKE 1/2 TABLET BY MOUTH EVERY DAY 12/29/21   Sueanne Margarita, MD  trandolapril (MAVIK) 4 MG tablet TAKE 1 TABLET BY MOUTH 2 TIMES DAILY. 08/16/21   Sueanne Margarita, MD    Physical Exam: Vitals:   01/01/22 2330 01/01/22 2345 01/02/22 0015 01/02/22 0030  BP: (!) 127/55 (!) 140/63 (!) 132/55 125/77  Pulse: 66 75 68 67  Resp: (!) 23 (!) 27 17 (!) 24  Temp:      TempSrc:      SpO2: 94% 97% 97% 97%  Weight:      Height:        Constitutional: NAD, no pallor or diaphoresis   Eyes: PERTLA, lids and conjunctivae normal ENMT: Mucous membranes are dry. Posterior pharynx clear of any exudate or lesions.   Neck: supple, no masses  Respiratory: no wheezing, no  crackles. No accessory muscle use.  Cardiovascular: S1 & S2 heard, regular rate and rhythm. No JVD. Abdomen: No distension, no tenderness, soft. Bowel sounds active.  Musculoskeletal: no clubbing / cyanosis. No joint deformity upper and lower extremities.   Skin: no significant rashes, lesions, ulcers. Warm, dry, well-perfused. Neurologic: CN 2-12 grossly intact. Moving all extremities. Alert and oriented.  Psychiatric: Calm. Cooperative.    Labs and Imaging on Admission: I have personally reviewed following labs and imaging studies  CBC: Recent Labs  Lab 01/01/22 2220  WBC 8.4  HGB 11.8*  HCT 36.8  MCV 77.3*  PLT 213   Basic Metabolic Panel: Recent Labs  Lab 01/01/22 2220  NA 140  K 3.5  CL 108  CO2 25  GLUCOSE 207*  BUN 21  CREATININE 0.89  CALCIUM 9.2  MG 1.6*   GFR: Estimated Creatinine Clearance: 41 mL/min (by C-G formula based on SCr of 0.89 mg/dL). Liver Function Tests: No results for input(s): "AST", "ALT", "ALKPHOS", "BILITOT", "PROT", "ALBUMIN" in the last 168 hours. No results for input(s): "LIPASE", "AMYLASE" in the last 168 hours. No results for input(s): "AMMONIA" in the last 168 hours. Coagulation Profile: Recent Labs  Lab 01/01/22 2220  INR 1.1   Cardiac Enzymes: No results for input(s): "CKTOTAL", "CKMB", "CKMBINDEX", "TROPONINI" in the last 168 hours. BNP (last 3 results) No results for input(s): "PROBNP" in the last 8760 hours. HbA1C: No results for input(s): "HGBA1C" in the last 72 hours. CBG: No results for input(s): "GLUCAP" in the last 168 hours. Lipid Profile: No results for input(s): "CHOL", "HDL", "LDLCALC", "TRIG", "CHOLHDL", "LDLDIRECT" in the last 72 hours. Thyroid  Function Tests: Recent Labs    01/01/22 2220  TSH 0.899   Anemia Panel: No results for input(s): "VITAMINB12", "FOLATE", "FERRITIN", "TIBC", "IRON", "RETICCTPCT" in the last 72 hours. Urine analysis:    Component Value Date/Time   COLORURINE STRAW (A)  06/30/2021 2001   APPEARANCEUR CLEAR 06/30/2021 2001   LABSPEC 1.028 06/30/2021 2001   PHURINE 6.0 06/30/2021 2001   GLUCOSEU NEGATIVE 06/30/2021 2001   HGBUR NEGATIVE 06/30/2021 2001   [place] NEGATIVE 06/30/2021 2001   [place] 06/30/2021 2001   PROTEINUR NEGATIVE 06/30/2021 2001   UROBILINOGEN 0.2 04/01/2014 1627   NITRITE NEGATIVE 06/30/2021 2001   LEUKOCYTESUR NEGATIVE 06/30/2021 2001   Sepsis Labs: '@LABRCNTIP'$ (procalcitonin:4,lacticidven:4) )No results found for this or any previous visit (from the past 240 hour(s)).   Radiological Exams on Admission: DG Chest 2 View  Result Date: 01/01/2022 CLINICAL DATA:  Atrial fibrillation EXAM: CHEST - 2 VIEW COMPARISON:  07/02/2021 FINDINGS: The heart size and mediastinal contours are within normal limits. Both lungs are clear. The visualized skeletal structures are unremarkable. IMPRESSION: No active cardiopulmonary disease. Electronically Signed   By: Ulyses Jarred M.D.   On: 01/01/2022 23:05    EKG: Independently reviewed. Atrial fibrillation, rate 137, repolarization abnormality.   Assessment/Plan  1. New-onset atrial fibrillation with RVR  - Developed chest discomfort, arm pain, and palpitations and was found to be in new a fib with rate 150s - She was given 10 mg IV diltiazem prior to arrival in ED  - TSH is normal  - She had EF 60-65% with trivial mitral regurgitation and normal left atrium dimensions on TTE from June 2023  - She was started on Eliquis in ED  - Continue cardiac monitoring, optimize electrolytes, continue Eliquis, follow-up with cardiology after discharge   2. Elevated troponin  - Troponin is 132 in ED in setting of rapid atrial fibrillation  - Repolarization abnormality noted on EKG, likely rate-related  - Suspect this is d/t rapid a fib (rather than the a fib being precipitated by ischemia) given onset of sxs while at rest, improvement in sxs with rate-control, and resolution of sxs when she  converted to SR  - Repeat EKG and troponin, check echocardiogram    3. Chronic HFpEF  - Appears compensated  - Continue Lasix, Aldactone, and trandolapril    4. Hypertension  - Continue hydralazine, trandolapril, and diuretics    5. Type II DM  - A1c was 6.1% in 2019  - Check CBGs, continue insulin    6. Ascending aortic aneurysm  - Followed outpatient and planned for repeat imaging in May 2024    DVT prophylaxis: Eliquis  Code Status: Full  Level of Care: Level of care: Progressive Family Communication: none present  Disposition Plan:  Patient is from: home  Anticipated d/c is to: Home  Anticipated d/c date is: 12/3 or 01/03/22  Patient currently: Pending repeat troponin and EKG, echocardiogram  Consults called: none  Admission status: Observation     Vianne Bulls, MD [facility]  01/02/2022, 2:20 AM

## 2022-01-02 NOTE — Progress Notes (Signed)
[place] for heparin Indication: chest pain/ACS  Heparin Dosing Weight: 66 kg  Labs: Recent Labs    01/01/22 2220 01/02/22 0232 01/02/22 0918 01/02/22 2039  HGB 11.8*  --  10.9*  --   HCT 36.8  --  34.3*  --   PLT 225  --  202  --   APTT 26  --   --  63*  LABPROT 14.0  --   --   --   INR 1.1  --   --   --   CREATININE 0.89 0.80  --   --      Assessment: 51 yof with hx of ascending thoracic aortic aneurysm, cerebral aneurysm s/p coiling, colon cancer s/p resection presenting with CP and palpitations, elevated troponin, found to be in afib with HR in 150s and started on apixaban in the ER ('5mg'$  dose x1 given so far on 12/3 at 0021). Pharmacy consulted to transition apixaban to heparin for ACS with troponin uptrending. Patient is not on anticoagulation PTA. Hg 11.8, plt WNL. No bleed issues reported.  Aptt this evening is just below goal at 63s. No bleeding or IV issues noted.   Goal of Therapy:  Heparin level 0.3-0.7 units/ml aPTT 66-102 seconds Monitor platelets by anticoagulation protocol: Yes   Plan:  Increase IV heparin to 1050 Monitor aPTT and heparin level daily until correlating, CBC, s/sx bleeding F/u Cardiology plans, transition back to apixaban as appropriate  Erin Hearing PharmD., BCPS Clinical Pharmacist 01/02/2022 9:30 PM

## 2022-01-03 ENCOUNTER — Telehealth (HOSPITAL_COMMUNITY): Payer: Self-pay | Admitting: Pharmacy Technician

## 2022-01-03 ENCOUNTER — Inpatient Hospital Stay (HOSPITAL_COMMUNITY): Payer: Medicare PPO

## 2022-01-03 ENCOUNTER — Other Ambulatory Visit (HOSPITAL_COMMUNITY): Payer: Self-pay

## 2022-01-03 DIAGNOSIS — I4891 Unspecified atrial fibrillation: Secondary | ICD-10-CM | POA: Diagnosis not present

## 2022-01-03 LAB — CBC
HCT: 33 % — ABNORMAL LOW (ref 36.0–46.0)
Hemoglobin: 10.3 g/dL — ABNORMAL LOW (ref 12.0–15.0)
MCH: 24.3 pg — ABNORMAL LOW (ref 26.0–34.0)
MCHC: 31.2 g/dL (ref 30.0–36.0)
MCV: 78 fL — ABNORMAL LOW (ref 80.0–100.0)
Platelets: 196 10*3/uL (ref 150–400)
RBC: 4.23 MIL/uL (ref 3.87–5.11)
RDW: 16.4 % — ABNORMAL HIGH (ref 11.5–15.5)
WBC: 10.7 10*3/uL — ABNORMAL HIGH (ref 4.0–10.5)
nRBC: 0 % (ref 0.0–0.2)

## 2022-01-03 LAB — HEPARIN LEVEL (UNFRACTIONATED): Heparin Unfractionated: 0.94 IU/mL — ABNORMAL HIGH (ref 0.30–0.70)

## 2022-01-03 LAB — BASIC METABOLIC PANEL
Anion gap: 4 — ABNORMAL LOW (ref 5–15)
BUN: 16 mg/dL (ref 8–23)
CO2: 26 mmol/L (ref 22–32)
Calcium: 8.6 mg/dL — ABNORMAL LOW (ref 8.9–10.3)
Chloride: 111 mmol/L (ref 98–111)
Creatinine, Ser: 0.79 mg/dL (ref 0.44–1.00)
GFR, Estimated: >60 mL/min (ref >60)
Glucose, Bld: 107 mg/dL — ABNORMAL HIGH (ref 70–99)
Potassium: 3.9 mmol/L (ref 3.5–5.1)
Sodium: 141 mmol/L (ref 135–145)

## 2022-01-03 LAB — ECHOCARDIOGRAM COMPLETE
AR max vel: 1.93 cm2
AV Area VTI: 2.21 cm2
AV Area mean vel: 2.04 cm2
AV Mean grad: 5 mmHg
AV Peak grad: 11 mmHg
Ao pk vel: 1.66 m/s
Area-P 1/2: 3.42 cm2
Calc EF: 64.1 %
Height: 63 in
MV M vel: 5.02 m/s
MV Peak grad: 100.6 mmHg
S' Lateral: 2.7 cm
Single Plane A2C EF: 62.5 %
Single Plane A4C EF: 64.2 %
Weight: 2546.75 oz

## 2022-01-03 LAB — MAGNESIUM: Magnesium: 2.2 mg/dL (ref 1.7–2.4)

## 2022-01-03 LAB — APTT
aPTT: 98 seconds — ABNORMAL HIGH (ref 24–36)
aPTT: 108 seconds — ABNORMAL HIGH (ref 24–36)

## 2022-01-03 LAB — GLUCOSE, CAPILLARY
Glucose-Capillary: 117 mg/dL — ABNORMAL HIGH (ref 70–99)
Glucose-Capillary: 128 mg/dL — ABNORMAL HIGH (ref 70–99)

## 2022-01-03 LAB — HEMOGLOBIN A1C
Hgb A1c MFr Bld: 6.2 % — ABNORMAL HIGH (ref 4.8–5.6)
Mean Plasma Glucose: 131 mg/dL

## 2022-01-03 MED ORDER — PERFLUTREN LIPID MICROSPHERE
1.0000 mL | INTRAVENOUS | Status: AC | PRN
  Administered 2022-01-03: 2 mL via INTRAVENOUS

## 2022-01-03 MED ORDER — APIXABAN 5 MG PO TABS: 5.0000 mg | ORAL_TABLET | Freq: Two times a day (BID) | ORAL | Status: DC

## 2022-01-03 MED ORDER — EZETIMIBE 10 MG PO TABS
10.0000 mg | ORAL_TABLET | Freq: Every day | ORAL | 3 refills | Status: DC
  Filled 2022-01-03: qty 30, 30d supply, fill #0

## 2022-01-03 MED ORDER — APIXABAN 5 MG PO TABS
5.0000 mg | ORAL_TABLET | Freq: Two times a day (BID) | ORAL | 3 refills | Status: DC
  Filled 2022-01-03: qty 60, 30d supply, fill #0

## 2022-01-03 MED ORDER — CARVEDILOL 6.25 MG PO TABS
6.2500 mg | ORAL_TABLET | Freq: Two times a day (BID) | ORAL | 3 refills | Status: DC
  Filled 2022-01-03: qty 60, 30d supply, fill #0

## 2022-01-03 NOTE — Progress Notes (Addendum)
[place] for heparin Indication: chest pain/ACS  Heparin Dosing Weight: 66 kg  Labs: Recent Labs    01/01/22 2220 01/02/22 0232 01/02/22 0918 01/02/22 2039 01/03/22 0252  HGB 11.8*  --  10.9*  --  10.3*  HCT 36.8  --  34.3*  --  33.0*  PLT 225  --  202  --  196  APTT 26  --   --  63* 98*  LABPROT 14.0  --   --   --   --   INR 1.1  --   --   --   --   HEPARINUNFRC  --   --   --   --  0.94*  CREATININE 0.89 0.80  --   --  0.79     Assessment: 66 yof with hx of ascending thoracic aortic aneurysm, cerebral aneurysm s/p coiling, colon cancer s/p resection presenting with CP and palpitations, elevated troponin, found to be in afib with HR in 150s and started on apixaban in the ER ('5mg'$  dose x1 given so far on 12/3 at 0021). Pharmacy consulted to transition apixaban to heparin for ACS with troponin uptrending. Patient is not on anticoagulation PTA.   Heparin level came back elevated as expected with recent DOAC, aPTT came back therapeutic at 98, on heparin infusion at 1050 units/hr. Hgb 10.3, plt 196. No s/sx of bleeding or infusion issues.   Goal of Therapy:  Heparin level 0.3-0.7 units/ml aPTT 66-102 seconds Monitor platelets by anticoagulation protocol: Yes   Plan:  Continue IV heparin at 1050 units/hr  Monitor aPTT and heparin level daily until correlating, CBC, s/sx bleeding F/u Cardiology plans, transition back to apixaban as appropriate  Antonietta Jewel, PharmD, [place] Pharmacist  Phone: [phone] 01/03/2022 7:25 AM  Please check AMION for all [facility] phone numbers After 10:00 PM, call [place] [phone]  ADDENDUM aPTT came back elevated at 108, on heparin infusion at 1050 units/hr. No s/sx of bleeding or infusion issues. Plan to change to apixaban - give age >53 but weight > 60 kg and Scr <1.5, would qualify for full dose apixaban 5 mg BID.   Antonietta Jewel, PharmD, [place] Clinical Pharmacist

## 2022-01-03 NOTE — Discharge Summary (Signed)
Physician Discharge Summary   Patient: [name] MRN: [id number] DOB: 08/04/1932  Admit date:     01/01/2022  Discharge date: 01/03/22  Discharge Physician: Edwin Dada   PCP: Antony Contras, MD     Recommendations at discharge:  Follow up with PCP Dr. Moreen Fowler in 1 week for new Afib Follow up with Cardiology as directed to Afib clinic     Discharge Diagnoses: Principal Problem:   Atrial fibrillation with RVR ([place]) Active Problems:   Elevated troponin   Ascending aortic aneurysm ([place])   Essential hypertension   DM (diabetes mellitus) ([place])   Hyperlipidemia   NSTEMI (non-ST elevated myocardial infarction) [facility])      Hospital Course: [name] is an 88 y.o. F with HTN, DM, HLD, hx AAA, PVD, hx TIA, and hx cerebral aneurysm s/p coiling years ago who presented with acute chest pain, left arm pain and tachycardia.  [name] with dementia was recently released from the hospital and she has been working hard taking care of him.  The night of admission she was cleaning up after dinner when she had sudden onset of severe chest and left arm pain.  Lay down, took her blood pressure and heart rate which were extremely high, the pain did not abate, so she called 911.  In the ER, EKG showed atrial fibrillation in the 130s.  Chest x-ray clear.  Troponin 130.      * Atrial fibrillation with RVR ([place]) Patient started on diltiazem and converted to sinus.  CHA2DS2-Vasc 8.    Started on Eliquis at discharge.  Started Coreg.   Demand ischemia Dissection ruled out.  Cardiology consulted, echo obtained that showed no suspicious RWMA.   Cardiology suspected demand ischemia from new Afib, not acute coronary syndrome.  Patient was clear she wanted to invasive procedures.      Hyperlipidemia Intolerant to Lipitor.  Started Zetia.  DM (diabetes mellitus) ([place]) A1c 6.1% on home glargine  Essential hypertension Blood pressure elevated.  Coreg added to  hydralazine, Lasix, spironolactone, trandolapril.            The [facility] Controlled Substances Registry was reviewed for this patient prior to discharge.  Consultants: Cardiology Procedures performed:  Echo CTA chest   Disposition: Home health   DISCHARGE MEDICATION: Allergies as of 01/03/2022       Reactions   Actos [pioglitazone]    Chronic  Pedal edema:contraindication   Avandia [rosiglitazone]    Chronic pedal edema:contraindication   Fosamax [alendronate Sodium]    unknown   Morphine Other (See Comments)   Ultram [tramadol] Shortness Of Breath, Palpitations   Glimepiride    hypersensitivity   Januvia [sitagliptin]    hypersensitivity   Lipitor [atorvastatin]    Causes muscle pain and swelling   Metformin And Related    gastritis   Prandin [repaglinide]    Abdominal pain: side effects   Tradjenta [linagliptin]    GI problems, hypersensitivity   Zoloft [sertraline Hcl]    Jittery or zombie like   Aspirin Other (See Comments)   Bleeding ulcers    Bentyl [dicyclomine Hcl]    Came close to passing out. weak   Ciprofloxacin Nausea And Vomiting   Severe stomach upset   Dicyclomine    Other reaction(s): Unknown   Hctz [hydrochlorothiazide] Other (See Comments)   URINARY ISSUES   Keflex [cephalexin]    Morphine And Related Other (See Comments)   Body Spasms   Norvasc [amlodipine Besylate]    Weak and  dizzy.    Oxycodone    Sulfa Antibiotics Hives   Sulfamethoxazole-trimethoprim    Other reaction(s): hives   Penicillins Swelling, Rash   Amoxicillin causes stomach upset.        Medication List     TAKE these medications    acetaminophen 500 MG tablet Commonly known as: TYLENOL Take 500 mg by mouth every 6 (six) hours as needed for mild pain, moderate pain, fever or headache.   ALPRAZolam 0.5 MG tablet Commonly known as: XANAX Take 0.5-1 mg by mouth 2 (two) times daily as needed for anxiety.   BD Pen Needle Nano 2nd Gen 32G X 4 MM  Misc Generic drug: Insulin Pen Needle See admin instructions.   carvedilol 6.25 MG tablet Commonly known as: COREG Take 1 tablet (6.25 mg total) by mouth 2 (two) times daily with a meal.   Eliquis 5 MG Tabs tablet Generic drug: apixaban Take 1 tablet (5 mg total) by mouth 2 (two) times daily.   ezetimibe 10 MG tablet Commonly known as: ZETIA Take 1 tablet (10 mg total) by mouth daily. Start taking on: January 04, 2022   furosemide 20 MG tablet Commonly known as: LASIX TAKE ONE TAB EVERY MON, WED, FRI. TAKE ONE ADDITIONAL TAB IF WEIGHT GAIN OF 3+ LBS OVERNIGHT   hydrALAZINE 10 MG tablet Commonly known as: APRESOLINE Take 1 tablet (10 mg total) by mouth 3 (three) times daily.   hydrocortisone 2.5 % rectal cream Commonly known as: ANUSOL-HC Apply to perianal area 3 times daily. What changed:  how to take this when to take this reasons to take this   insulin glargine 100 UNIT/ML Solostar Pen Commonly known as: LANTUS Inject 20 Units into the skin daily with breakfast.   multivitamin tablet Take 1 tablet by mouth daily.   pantoprazole 40 MG tablet Commonly known as: PROTONIX TAKE 1 TABLET BY MOUTH EVERY DAY BEFORE BREAKFAST   polyethylene glycol 17 g packet Commonly known as: MIRALAX / GLYCOLAX Take 17 g by mouth daily as needed for mild constipation.   spironolactone 25 MG tablet Commonly known as: ALDACTONE TAKE 1/2 TABLET BY MOUTH EVERY DAY   trandolapril 4 MG tablet Commonly known as: MAVIK TAKE 1 TABLET BY MOUTH 2 TIMES DAILY.   Vitamin D-3 25 MCG (1000 UT) Caps Take 3 capsules by mouth in the morning.        Follow-up Information     [facility] Follow up.   Specialty: Home Health Services Why: Home Health- Registered Nurse, Aide,  Physical and Occupational Therapy-office to call the patient with visits times. Contact information: [address] [place] [address] [phone]         Antony Contras, MD. Schedule an  appointment as soon as possible for a visit in 1 week(s).   Specialty: Family Medicine Contact information: [place] [address] [phone]                 Discharge Instructions     Amb referral to AFIB Clinic   Complete by: As directed    Amb referral to AFIB Clinic   Complete by: As directed    Discharge instructions   Complete by: As directed    **IMPORTANT DISCHARGE INSTRUCTIONS**   From Dr. Loleta Books:  You were admitted for chest and arm pain and found to have a new fast abnormal heart rhythm  You were started on a new blood thinner to lower risk of  clots because of the heart rhythm (apixaban/Eliquis)  You were also started on a new medicine to slow the fast abnormal heart rhythm (carvedilol/Coreg)  Lastly, because we don't know if you had a heart attack, and because you can't tolerate statin medicines, we started a cholesterol medicine, Zetia/ezetimibe  Zetia is not as effective as statins, but it also doesn't have as many side effects  Follow up with Cardiology as Dr. Radford Pax recommended  Go see your primary doctor in 1 week  If you have dark sticky stools, or black stools or black diarrhea or bloody diarrhea afer starting Eliquis, call your doctor immediately   Increase activity slowly   Complete by: As directed        Discharge Exam: Filed Weights   01/01/22 2147 01/03/22 0500  Weight: 67.1 kg 72.2 kg    General: Pt is alert, awake, not in acute distress Cardiovascular: RRR, nl S1-S2, no murmurs appreciated.   No LE edema.   Respiratory: Normal respiratory rate and rhythm.  CTAB without rales or wheezes. Abdominal: Abdomen soft and non-tender.  No distension or HSM.   Neuro/Psych: Strength symmetric in upper and lower extremities.  Judgment and insight appear normal.   Condition at discharge: good  The results of significant diagnostics from this hospitalization (including imaging, microbiology, ancillary and  laboratory) are listed below for reference.   Imaging Studies: ECHOCARDIOGRAM COMPLETE  Result Date: 01/03/2022    ECHOCARDIOGRAM REPORT   Patient Name:   THRESIA RAMANATHAN Date of Exam: 01/03/2022 Medical Rec #:  [id number]       Height:       63.0 in Accession #:    [id number]      Weight:       159.2 lb Date of Birth:  1932-04-23       BSA:          1.755 m Patient Age:    29 years        BP:           168/57 mmHg Patient Gender: F               HR:           64 bpm. Exam Location:  Inpatient Procedure: 2D Echo and Intracardiac Opacification Agent Indications:    atrial fibrillation  History:        Patient has prior history of Echocardiogram examinations, most                 recent 07/01/2021. Arrythmias:Atrial Fibrillation; Risk                 Factors:Dyslipidemia, Diabetes and Hypertension.  Sonographer:    Harvie Junior Referring Phys: [id number] TIMOTHY S OPYD  Sonographer Comments: Technically difficult study due to poor echo windows. IMPRESSIONS  1. Basal inferior hypokinesis/akinesis. Poor acoustic windows Definity used.     Compared to echo from June 2023 basal inferior changes are more prominent. Left ventricular ejection fraction, by estimation, is 55 to 60%. The left ventricle has normal function. Left ventricular diastolic parameters are consistent with Grade I diastolic dysfunction (impaired relaxation).  2. Right ventricular systolic function is low normal. The right ventricular size is normal. There is normal pulmonary artery systolic pressure.  3. Left atrial size was mildly dilated.  4. Mild mitral valve regurgitation.  5. AV is mildly thickened, calcified There is very mildly restricted motion of the valve . Peak and mean gradients through the valve are 11 and 5 mm  Hg respectively. No signficant change when compared to echo from June 2023. Marland Kitchen The aortic valve is tricuspid.  6. There is mild dilatation of the ascending aorta, measuring 39 mm.  7. The inferior vena cava is normal in size with greater  than 50% respiratory variability, suggesting right atrial pressure of 3 mmHg. FINDINGS  Left Ventricle: Basal inferior hypokinesis/akinesis. Poor acoustic windows Definity used. Compared to echo from June 2023 basal inferior changes are more prominent. Left ventricular ejection fraction, by estimation, is 55 to 60%. The left ventricle has normal function. Definity contrast agent was given IV to delineate the left ventricular endocardial borders. The left ventricular internal cavity size was normal in size. There is no left ventricular hypertrophy. Left ventricular diastolic parameters are consistent with Grade I diastolic dysfunction (impaired relaxation). Right Ventricle: The right ventricular size is normal. Right vetricular wall thickness was not assessed. Right ventricular systolic function is low normal. There is normal pulmonary artery systolic pressure. The tricuspid regurgitant velocity is 2.71 m/s, and with an assumed right atrial pressure of 3 mmHg, the estimated right ventricular systolic pressure is 74.1 mmHg. Left Atrium: Left atrial size was mildly dilated. Right Atrium: Right atrial size was normal in size. Pericardium: Trivial pericardial effusion is present. Mitral Valve: There is mild thickening of the mitral valve leaflet(s). Mild mitral valve regurgitation. Tricuspid Valve: The tricuspid valve is normal in structure. Tricuspid valve regurgitation is trivial. Aortic Valve: AV is mildly thickened, calcified There is very mildly restricted motion of the valve . Peak and mean gradients through the valve are 11 and 5 mm Hg respectively. No signficant change when compared to echo from June 2023. The aortic valve is tricuspid. Aortic valve mean gradient measures 5.0 mmHg. Aortic valve peak gradient measures 11.0 mmHg. Aortic valve area, by VTI measures 2.21 cm. Pulmonic Valve: The pulmonic valve was normal in structure. Pulmonic valve regurgitation is trivial. Aorta: The aortic root is normal in size  and structure. There is mild dilatation of the ascending aorta, measuring 39 mm. Venous: The inferior vena cava is normal in size with greater than 50% respiratory variability, suggesting right atrial pressure of 3 mmHg. IAS/Shunts: No atrial level shunt detected by color flow Doppler.  LEFT VENTRICLE PLAX 2D LVIDd:         4.20 cm     Diastology LVIDs:         2.70 cm     LV e' medial:    5.91 cm/s LV PW:         1.00 cm     LV E/e' medial:  13.3 LV IVS:        1.00 cm     LV e' lateral:   5.75 cm/s LVOT diam:     1.90 cm     LV E/e' lateral: 13.7 LV SV:         72 LV SV Index:   41 LVOT Area:     2.84 cm  LV Volumes (MOD) LV vol d, MOD A2C: 52.5 ml LV vol d, MOD A4C: 67.3 ml LV vol s, MOD A2C: 19.7 ml LV vol s, MOD A4C: 24.1 ml LV SV MOD A2C:     32.8 ml LV SV MOD A4C:     67.3 ml LV SV MOD BP:      42.9 ml RIGHT VENTRICLE RV Basal diam:  3.70 cm RV Mid diam:    2.70 cm RV S prime:     21.60 cm/s TAPSE (M-mode): 2.4 cm  LEFT ATRIUM             Index        RIGHT ATRIUM           Index LA diam:        3.00 cm 1.71 cm/m   RA Area:     16.80 cm LA Vol (A2C):   56.9 ml 32.43 ml/m  RA Volume:   46.40 ml  26.44 ml/m LA Vol (A4C):   65.2 ml 37.15 ml/m LA Biplane Vol: 61.0 ml 34.76 ml/m  AORTIC VALVE                     PULMONIC VALVE AV Area (Vmax):    1.93 cm      PV Vmax:       0.92 m/s AV Area (Vmean):   2.04 cm      PV Peak grad:  3.4 mmHg AV Area (VTI):     2.21 cm AV Vmax:           166.00 cm/s AV Vmean:          100.000 cm/s AV VTI:            0.327 m AV Peak Grad:      11.0 mmHg AV Mean Grad:      5.0 mmHg LVOT Vmax:         113.00 cm/s LVOT Vmean:        72.000 cm/s LVOT VTI:          0.255 m LVOT/AV VTI ratio: 0.78  AORTA Ao Root diam: 4.10 cm Ao Asc diam:  3.90 cm MITRAL VALVE                TRICUSPID VALVE MV Area (PHT): 3.42 cm     TR Peak grad:   29.4 mmHg MV Decel Time: 222 msec     TR Vmax:        271.00 cm/s MR Peak grad: 100.6 mmHg MR Vmax:      501.50 cm/s   SHUNTS MV E velocity: 78.60 cm/s    Systemic VTI:  0.26 m MV A velocity: 104.00 cm/s  Systemic Diam: 1.90 cm MV E/A ratio:  0.76 Dorris Carnes MD Electronically signed by Dorris Carnes MD Signature Date/Time: 01/03/2022/11:45:28 AM    Final    CT Angio Chest/Abd/Pel for Dissection W and/or W/WO  Result Date: 01/02/2022 CLINICAL DATA:  Acute aortic syndrome suspected.  Chest pain. EXAM: CT ANGIOGRAPHY CHEST, ABDOMEN AND PELVIS TECHNIQUE: Non-contrast CT of the chest was initially obtained. Multidetector CT imaging through the chest, abdomen and pelvis was performed using the standard protocol during bolus administration of intravenous contrast. Multiplanar reconstructed images and MIPs were obtained and reviewed to evaluate the vascular anatomy. RADIATION DOSE REDUCTION: This exam was performed according to the departmental dose-optimization program which includes automated exposure control, adjustment of the mA and/or kV according to patient size and/or use of iterative reconstruction technique. CONTRAST:  26m OMNIPAQUE IOHEXOL 350 MG/ML SOLN COMPARISON:  Jun 30, 2021 FINDINGS: CTA CHEST FINDINGS Cardiovascular: Three-vessel coronary artery disease identified. Mild cardiomegaly. The main pulmonary artery measures 3.5 cm. No pulmonary emboli identified. The ascending thoracic aorta measures 4.3 by 4.6 cm by my measurement today versus 4.3 by 4.4 cm by my measurement previously. No dissection identified in the thoracic aorta. Mediastinum/Nodes: No pleural or pericardial effusion. Chest wall is unremarkable. No suspicious abnormalities identified in the thyroid. Ace tiny hiatal hernia is noted. The esophagus  is normal. No adenopathy. Lungs/Pleura: Central airways are within normal limits. No pneumothorax. A new nodules identified in the right apex measuring 7 mm on series 8, image 34. A 3 mm nodule remains in the right base on series 8, image 101. A mildly nodular opacities identified in the lingula on series 8, image 65, new in the interval. No other new  nodules. Mild interlobular septal thickening is identified in the upper lobes. Musculoskeletal: No chest wall abnormality. No acute or significant osseous findings. Review of the MIP images confirms the above findings. CTA ABDOMEN AND PELVIS FINDINGS VASCULAR Aorta: No aneurysmal dilatation identified in the abdominal aorta. No dissection. Atherosclerotic changes are noted. Celiac: Patent without evidence of aneurysm, dissection, vasculitis or significant stenosis. SMA: Patent without evidence of aneurysm, dissection, vasculitis or significant stenosis. Renals: Both renal arteries are patent without evidence of aneurysm, dissection, vasculitis, fibromuscular dysplasia or significant stenosis. IMA: Patent without evidence of aneurysm, dissection, vasculitis or significant stenosis. Inflow: Patent without evidence of aneurysm, dissection, vasculitis or significant stenosis. Veins: No obvious venous abnormality within the limitations of this arterial phase study. Review of the MIP images confirms the above findings. NON-VASCULAR Hepatobiliary: No focal liver abnormality is seen. No gallstones, gallbladder wall thickening, or biliary dilatation. Pancreas: Significant fatty replacement. No abnormal mass or evidence of pancreatitis. Spleen: Normal in size without focal abnormality. Adrenals/Urinary Tract: Adrenal glands are unremarkable. Kidneys are normal, without renal calculi, focal lesion, or hydronephrosis. Bladder is unremarkable. Stomach/Bowel: The stomach and small bowel are normal. Scattered colonic diverticulosis is identified without diverticulitis. The appendix is not visualized. Lymphatic: No lymphadenopathy identified. Reproductive: Status post hysterectomy. No adnexal masses. Other: No free air or free fluid. Musculoskeletal: No acute or significant osseous findings. Review of the MIP images confirms the above findings. IMPRESSION: 1. No aortic dissection identified. 2. The ascending thoracic aorta measures  4.3 x 4.6 cm by my measurement today versus 4.3 x 4.4 cm by my measurement previously. Ascending thoracic aortic aneurysm. Recommend semi-annual imaging followup by CTA or MRA and referral to cardiothoracic surgery if not already obtained. This recommendation follows 2010 ACCF/AHA/AATS/ACR/ASA/SCA/SCAI/SIR/STS/SVM Guidelines for the Diagnosis and Management of Patients With Thoracic Aortic Disease. Circulation. 2010; 121: Y301-S010. Aortic aneurysm NOS (ICD10-I71.9) 3. Three-vessel coronary artery disease. 4. The main pulmonary artery is mildly enlarged raising the possibility of pulmonary arterial hypertension. 5. There is a new 7 mm nodule in the right apex. Given the short interval follow-up, this is favored to be infectious or inflammatory rather than neoplastic. Per Fleischner Society Guidelines, recommend a non-contrast Chest CT at 3-6 months, then consider another non-contrast Chest CT at 18-24 months. If patient is low risk for malignancy, non-contrast Chest CT at 18-24 months is optional. These guidelines do not apply to immunocompromised patients and patients with cancer. Follow up in patients with significant comorbidities as clinically warranted. For lung cancer screening, adhere to Lung-RADS guidelines. Reference: Radiology. 2017; 284(1):228-43. 6. Colonic diverticulosis without diverticulitis. 7. No other abnormalities. Electronically Signed   By: Dorise Bullion III M.D.   On: 01/02/2022 11:14   DG Chest 2 View  Result Date: 01/01/2022 CLINICAL DATA:  Atrial fibrillation EXAM: CHEST - 2 VIEW COMPARISON:  07/02/2021 FINDINGS: The heart size and mediastinal contours are within normal limits. Both lungs are clear. The visualized skeletal structures are unremarkable. IMPRESSION: No active cardiopulmonary disease. Electronically Signed   By: Ulyses Jarred M.D.   On: 01/01/2022 23:05    Microbiology: Results for orders placed or performed during the hospital  encounter of 06/30/21  Urine Culture      Status: Abnormal   Collection Time: 06/30/21  8:01 PM   Specimen: Urine, Clean Catch  Result Value Ref Range Status   Specimen Description   Final    URINE, CLEAN CATCH Performed at [facility], [place] [address] [place], [place] [address]    Special Requests   Final    NONE Performed at [facility], [place] [address] [place], [place] [address]    Culture (A)  Final    <10,000 COLONIES/mL INSIGNIFICANT GROWTH Performed at [facility] [address], [place], [place] [address]    Report Status 07/01/2021 FINAL  Final    Labs: CBC: Recent Labs  Lab 01/01/22 2220 01/02/22 0918 01/03/22 0252  WBC 8.4 7.3 10.7*  HGB 11.8* 10.9* 10.3*  HCT 36.8 34.3* 33.0*  MCV 77.3* 77.6* 78.0*  PLT 225 202 400   Basic Metabolic Panel: Recent Labs  Lab 01/01/22 2220 01/02/22 0232 01/03/22 0252  NA 140 141 141  K 3.5 4.0 3.9  CL 108 109 111  CO2 '25 25 26  '$ GLUCOSE 207* 97 107*  BUN '21 20 16  '$ CREATININE 0.89 0.80 0.79  CALCIUM 9.2 8.8* 8.6*  MG 1.6* 1.6* 2.2   Liver Function Tests: No results for input(s): "AST", "ALT", "ALKPHOS", "BILITOT", "PROT", "ALBUMIN" in the last 168 hours. CBG: Recent Labs  Lab 01/02/22 1221 01/02/22 1323 01/02/22 2131 01/03/22 0831 01/03/22 1227  GLUCAP 141* 133* 92 117* 128*    Discharge time spent: approximately 35 minutes spent on discharge counseling, evaluation of patient on day of discharge, and coordination of discharge planning with nursing, social work, pharmacy and case management  Signed: Edwin Dada, MD [facility] 01/03/2022

## 2022-01-03 NOTE — Telephone Encounter (Signed)
Pharmacy Patient Advocate Encounter  Insurance verification completed.    The patient is insured through [employer]   The patient is currently admitted and ran test claims for the following: Eliquis '5mg'$ , Xarelto '20mg'$ .  Copays and coinsurance results were relayed to Inpatient clinical team.

## 2022-01-03 NOTE — Progress Notes (Signed)
Explained discharge instructions to patient. Reviewed follow up appointment and next medication administration times. Also reviewed education about Eliquis administration and Atrial Fibrillation. Patient verbalized having an understanding for instructions given. All belongings are in the patient's possession to include TOC meds. IV and telemetry were removed by floor staff prior to my explaining the discharge instructions. No other needs verbalized. Transported downstairs for discharge by Rowe Clack RN

## 2022-01-03 NOTE — Plan of Care (Signed)
  Problem: Education: Goal: Knowledge of disease or condition will improve Outcome: Adequate for Discharge Goal: Understanding of medication regimen will improve Outcome: Adequate for Discharge Goal: Individualized Educational Video(s) Outcome: Adequate for Discharge   Problem: Activity: Goal: Ability to tolerate increased activity will improve Outcome: Adequate for Discharge   Problem: Cardiac: Goal: Ability to achieve and maintain adequate cardiopulmonary perfusion will improve Outcome: Adequate for Discharge   Problem: Health Behavior/Discharge Planning: Goal: Ability to safely manage health-related needs after discharge will improve Outcome: Adequate for Discharge   Problem: Education: Goal: Ability to describe self-care measures that may prevent or decrease complications (Diabetes Survival Skills Education) will improve Outcome: Adequate for Discharge Goal: Individualized Educational Video(s) Outcome: Adequate for Discharge   Problem: Coping: Goal: Ability to adjust to condition or change in health will improve Outcome: Adequate for Discharge   Problem: Fluid Volume: Goal: Ability to maintain a balanced intake and output will improve Outcome: Adequate for Discharge   Problem: Health Behavior/Discharge Planning: Goal: Ability to identify and utilize available resources and services will improve Outcome: Adequate for Discharge Goal: Ability to manage health-related needs will improve Outcome: Adequate for Discharge   Problem: Metabolic: Goal: Ability to maintain appropriate glucose levels will improve Outcome: Adequate for Discharge   Problem: Nutritional: Goal: Maintenance of adequate nutrition will improve Outcome: Adequate for Discharge Goal: Progress toward achieving an optimal weight will improve Outcome: Adequate for Discharge

## 2022-01-03 NOTE — TOC Initial Note (Signed)
Transition of Care [facility]) - Initial/Assessment Note    Patient Details  Name: [name] MRN: [id number] Date of Birth: 1932-02-05  Transition of [facility] CM/SW Contact:    Bethena Roys, RN Phone Number: 01/03/2022, 12:21 PM  Clinical Narrative:  Case Manager received notification that the patient will transition home today with home health services. PTA patient was from home with spouse. Patient states she and her spouse has a caregiver that comes in 3 days per week and she is open to home health services. Patient states caregiver runs errands and helps them in the home. Patient states she gets her medications without any issues. Patient recently had been active with [facility]. Patient wants to return home with [place], PT, Aide. Case Manager made the referral with [place] and start of care to begin within 24-48 hours post transition home. Patient reports that she can get transportation home.  No further needs identified at this time.               Expected Discharge Plan: [place] Barriers to Discharge: No Barriers Identified   Patient Goals and CMS Choice Patient states their goals for this hospitalization and ongoing recovery are:: to return home CMS Medicare.gov Compare Post Acute Care list provided to:: Patient Choice offered to / list presented to : Patient  Expected Discharge Plan and Services Expected Discharge Plan: [place] In-house Referral: NA Discharge Planning Services: CM Consult Post Acute Care Choice: [place] arrangements for the past 2 months: Single Family Home Expected Discharge Date: 01/03/22               DME Arranged: N/A         HH Arranged: RN, Disease Management, PT, OT, Nurse's Aide HH Agency: [place] Date [place]: 01/03/22 Time [place]: 1221 Representative spoke with at [place]: Claiborne Billings  Prior Living  Arrangements/Services Living arrangements for the past 2 months: [place] with:: Spouse Patient language and need for interpreter reviewed:: Yes Do you feel safe going back to the place where you live?: Yes      Need for Family Participation in Patient Care: Yes (Comment) Care giver support system in place?: Yes (comment)   Criminal Activity/Legal Involvement Pertinent to Current Situation/Hospitalization: No - Comment as needed  Permission Sought/Granted Permission sought to share information with : Family Supports, Customer service manager, Case Optician, dispensing granted to share information with : Yes, Verbal Permission Granted     Permission granted to share info w AGENCY: [place]        Emotional Assessment Appearance:: Appears stated age Attitude/Demeanor/Rapport: Engaged Affect (typically observed): Appropriate Orientation: : Oriented to Place, Oriented to Self Alcohol / Substance Use: Not Applicable Psych Involvement: No (comment)  Admission diagnosis:  Atrial fibrillation with RVR ([place]) [I48.91] Atrial fibrillation, unspecified type ([place]) [I48.91] NSTEMI (non-ST elevated myocardial infarction) [facility]) [I21.4] Patient Active Problem List   Diagnosis Date Noted   Elevated troponin 01/02/2022   Hyperlipidemia 01/02/2022   NSTEMI (non-ST elevated myocardial infarction) ([place]) 01/02/2022   Atrial fibrillation with RVR ([place]) 01/01/2022   Lung nodule 07/12/2021   Weakness 06/30/2021   Ankle edema, bilateral 01/21/2021   Sprain of left rotator cuff capsule 01/21/2021   Cervical radiculopathy due to degenerative joint disease of spine 01/21/2021   Left wrist pain 08/06/2019   Bicuspid aortic valve    Constipation 11/14/2017  Rectal bleeding 11/14/2017   Carotid stenosis    Heart palpitations 03/23/2017   Ascending aortic aneurysm (HCC) 12/07/2015   SOB (shortness of breath) 12/07/2015   Orthostatic hypotension 04/02/2014    Hematochezia 04/02/2014   Pain in the chest    Near syncope 04/01/2014   DM (diabetes mellitus) ([place]) 04/01/2014   Diverticulosis 04/01/2014   Unruptured cerebral aneurysm 01/08/2014   Multifactorial gait disorder 01/08/2014   Hereditary and idiopathic peripheral neuropathy 01/08/2014   Aneurysm, cerebral, nonruptured 08/07/2012   Amaurosis fugax 08/07/2012   Unspecified hereditary and idiopathic peripheral neuropathy 08/07/2012   Benign essential tremor syndrome    Abnormal x-ray of lumbar spine 08/29/2010   Low back pain 08/24/2010   Anxiety state 03/26/2010   Essential hypertension 03/26/2010   Abdominal pain, bilateral lower quadrant 03/26/2010   GERD 12/22/2008   IRON DEFICIENCY 05/22/2008   Irritable bowel syndrome 04/07/2008   COLONIC POLYPS, ADENOMATOUS, HX OF 04/07/2008   PCP:  Antony Contras, MD Pharmacy:   [id number] [place] [id number]: [phone]: [phone] Readmission Risk Interventions     No data to display

## 2022-01-03 NOTE — Progress Notes (Signed)
Patient ambulated from room to nurses station and back with no difficulty with a walker.

## 2022-01-03 NOTE — Progress Notes (Signed)
  Echocardiogram 2D Echocardiogram has been performed.  Lana Fish 01/03/2022, 8:34 AM

## 2022-01-03 NOTE — Discharge Instructions (Signed)

## 2022-01-03 NOTE — Progress Notes (Signed)
Progress Note  Patient Name: [name] Date of Encounter: 01/03/2022  [facility] Cardiologist: Fransico Him, MD   Subjective   Denies any chest pain or shortness of breath this morning.  Telemetry shows normal sinus rhythm on p.o. amiodarone  Inpatient Medications    Scheduled Meds:  apixaban  5 mg Oral BID   aspirin  324 mg Oral Once   carvedilol  6.25 mg Oral BID WC   ezetimibe  10 mg Oral Daily   furosemide  20 mg Oral Q M,W,F   hydrALAZINE  25 mg Oral TID   insulin aspart  0-5 Units Subcutaneous QHS   insulin aspart  0-6 Units Subcutaneous TID WC   insulin glargine-yfgn  14 Units Subcutaneous Daily   pantoprazole  40 mg Oral Daily   sodium chloride flush  3 mL Intravenous Q12H   spironolactone  12.5 mg Oral Daily   trandolapril  4 mg Oral BID   Continuous Infusions:  PRN Meds: acetaminophen **OR** acetaminophen, labetalol, ondansetron **OR** ondansetron (ZOFRAN) IV, polyethylene glycol   Vital Signs    Vitals:   01/03/22 0424 01/03/22 0500 01/03/22 0954 01/03/22 1228  BP: (!) 168/57  (!) 163/66 (!) 173/61  Pulse: (!) 58 (!) 56 68 63  Resp: 19   18  Temp: 98.1 F (36.7 C)   98 F (36.7 C)  TempSrc: Oral   Oral  SpO2: 94% 94%  96%  Weight:  72.2 kg    Height:        Intake/Output Summary (Last 24 hours) at 01/03/2022 1507 Last data filed at 01/03/2022 1231 Gross per 24 hour  Intake 240 ml  Output 600 ml  Net -360 ml      01/03/2022    5:00 AM 01/01/2022    9:47 PM 07/13/2021   10:50 AM  Last 3 Weights  Weight (lbs) 159 lb 2.8 oz 148 lb 147 lb 9.6 oz  Weight (kg) 72.2 kg 67.132 kg 66.951 kg      Telemetry    Normal sinus- Personally Reviewed  ECG    Sinus rhythm with nonspecific T wave abnormality and occasional PACs QTc 4 8 ms- Personally Reviewed  Physical Exam   GEN: No acute distress.   Neck: No JVD Cardiac: RRR, no murmurs, rubs, or gallops.  Respiratory: Clear to auscultation bilaterally. GI: Soft, nontender,  non-distended  MS: No edema; No deformity. Neuro:  Nonfocal  Psych: Normal affect   Labs    High Sensitivity Troponin:   Recent Labs  Lab 01/01/22 2220 01/02/22 0232 01/02/22 0817 01/02/22 0918  TROPONINIHS 132* 2,092* 3,162* 2,858*      Chemistry Recent Labs  Lab 01/01/22 2220 01/02/22 0232 01/03/22 0252  NA 140 141 141  K 3.5 4.0 3.9  CL 108 109 111  CO2 '25 25 26  '$ GLUCOSE 207* 97 107*  BUN '21 20 16  '$ CREATININE 0.89 0.80 0.79  CALCIUM 9.2 8.8* 8.6*  GFRNONAA >60 >60 >60  ANIONGAP 7 7 4*     Hematology Recent Labs  Lab 01/01/22 2220 01/02/22 0918 01/03/22 0252  WBC 8.4 7.3 10.7*  RBC 4.76 4.42 4.23  HGB 11.8* 10.9* 10.3*  HCT 36.8 34.3* 33.0*  MCV 77.3* 77.6* 78.0*  MCH 24.8* 24.7* 24.3*  MCHC 32.1 31.8 31.2  RDW 16.1* 16.5* 16.4*  PLT 225 202 196    BNP Recent Labs  Lab 01/01/22 2220  BNP 234.2*     DDimer No results for input(s): "DDIMER" in the  last 168 hours.   CHA2DS2-VASc Score = 7   This indicates a 11.2% annual risk of stroke. The patient's score is based upon: CHF History: 1 HTN History: 1 Diabetes History: 1 Stroke History: 0 Vascular Disease History: 1 Age Score: 2 Gender Score: 1      Radiology    ECHOCARDIOGRAM COMPLETE  Result Date: 01/03/2022    ECHOCARDIOGRAM REPORT   Patient Name:   [name] Date of Exam: 01/03/2022 Medical Rec #:  [id number]       Height:       63.0 in Accession #:    [id number]      Weight:       159.2 lb Date of Birth:  1932-08-16       BSA:          1.755 m Patient Age:    88 years        BP:           168/57 mmHg Patient Gender: F               HR:           64 bpm. Exam Location:  Inpatient Procedure: 2D Echo and Intracardiac Opacification Agent Indications:    atrial fibrillation  History:        Patient has prior history of Echocardiogram examinations, most                 recent 07/01/2021. Arrythmias:Atrial Fibrillation; Risk                 Factors:Dyslipidemia, Diabetes and Hypertension.   Sonographer:    Harvie Junior Referring Phys: [id number] TIMOTHY S OPYD  Sonographer Comments: Technically difficult study due to poor echo windows. IMPRESSIONS  1. Basal inferior hypokinesis/akinesis. Poor acoustic windows Definity used.     Compared to echo from June 2023 basal inferior changes are more prominent. Left ventricular ejection fraction, by estimation, is 55 to 60%. The left ventricle has normal function. Left ventricular diastolic parameters are consistent with Grade I diastolic dysfunction (impaired relaxation).  2. Right ventricular systolic function is low normal. The right ventricular size is normal. There is normal pulmonary artery systolic pressure.  3. Left atrial size was mildly dilated.  4. Mild mitral valve regurgitation.  5. AV is mildly thickened, calcified There is very mildly restricted motion of the valve . Peak and mean gradients through the valve are 11 and 5 mm Hg respectively. No signficant change when compared to echo from June 2023. Marland Kitchen The aortic valve is tricuspid.  6. There is mild dilatation of the ascending aorta, measuring 39 mm.  7. The inferior vena cava is normal in size with greater than 50% respiratory variability, suggesting right atrial pressure of 3 mmHg. FINDINGS  Left Ventricle: Basal inferior hypokinesis/akinesis. Poor acoustic windows Definity used. Compared to echo from June 2023 basal inferior changes are more prominent. Left ventricular ejection fraction, by estimation, is 55 to 60%. The left ventricle has normal function. Definity contrast agent was given IV to delineate the left ventricular endocardial borders. The left ventricular internal cavity size was normal in size. There is no left ventricular hypertrophy. Left ventricular diastolic parameters are consistent with Grade I diastolic dysfunction (impaired relaxation). Right Ventricle: The right ventricular size is normal. Right vetricular wall thickness was not assessed. Right ventricular systolic function is  low normal. There is normal pulmonary artery systolic pressure. The tricuspid regurgitant velocity is 2.71 m/s, and with an assumed right atrial  pressure of 3 mmHg, the estimated right ventricular systolic pressure is 57.8 mmHg. Left Atrium: Left atrial size was mildly dilated. Right Atrium: Right atrial size was normal in size. Pericardium: Trivial pericardial effusion is present. Mitral Valve: There is mild thickening of the mitral valve leaflet(s). Mild mitral valve regurgitation. Tricuspid Valve: The tricuspid valve is normal in structure. Tricuspid valve regurgitation is trivial. Aortic Valve: AV is mildly thickened, calcified There is very mildly restricted motion of the valve . Peak and mean gradients through the valve are 11 and 5 mm Hg respectively. No signficant change when compared to echo from June 2023. The aortic valve is tricuspid. Aortic valve mean gradient measures 5.0 mmHg. Aortic valve peak gradient measures 11.0 mmHg. Aortic valve area, by VTI measures 2.21 cm. Pulmonic Valve: The pulmonic valve was normal in structure. Pulmonic valve regurgitation is trivial. Aorta: The aortic root is normal in size and structure. There is mild dilatation of the ascending aorta, measuring 39 mm. Venous: The inferior vena cava is normal in size with greater than 50% respiratory variability, suggesting right atrial pressure of 3 mmHg. IAS/Shunts: No atrial level shunt detected by color flow Doppler.  LEFT VENTRICLE PLAX 2D LVIDd:         4.20 cm     Diastology LVIDs:         2.70 cm     LV e' medial:    5.91 cm/s LV PW:         1.00 cm     LV E/e' medial:  13.3 LV IVS:        1.00 cm     LV e' lateral:   5.75 cm/s LVOT diam:     1.90 cm     LV E/e' lateral: 13.7 LV SV:         72 LV SV Index:   41 LVOT Area:     2.84 cm  LV Volumes (MOD) LV vol d, MOD A2C: 52.5 ml LV vol d, MOD A4C: 67.3 ml LV vol s, MOD A2C: 19.7 ml LV vol s, MOD A4C: 24.1 ml LV SV MOD A2C:     32.8 ml LV SV MOD A4C:     67.3 ml LV SV MOD BP:       42.9 ml RIGHT VENTRICLE RV Basal diam:  3.70 cm RV Mid diam:    2.70 cm RV S prime:     21.60 cm/s TAPSE (M-mode): 2.4 cm LEFT ATRIUM             Index        RIGHT ATRIUM           Index LA diam:        3.00 cm 1.71 cm/m   RA Area:     16.80 cm LA Vol (A2C):   56.9 ml 32.43 ml/m  RA Volume:   46.40 ml  26.44 ml/m LA Vol (A4C):   65.2 ml 37.15 ml/m LA Biplane Vol: 61.0 ml 34.76 ml/m  AORTIC VALVE                     PULMONIC VALVE AV Area (Vmax):    1.93 cm      PV Vmax:       0.92 m/s AV Area (Vmean):   2.04 cm      PV Peak grad:  3.4 mmHg AV Area (VTI):     2.21 cm AV Vmax:  166.00 cm/s AV Vmean:          100.000 cm/s AV VTI:            0.327 m AV Peak Grad:      11.0 mmHg AV Mean Grad:      5.0 mmHg LVOT Vmax:         113.00 cm/s LVOT Vmean:        72.000 cm/s LVOT VTI:          0.255 m LVOT/AV VTI ratio: 0.78  AORTA Ao Root diam: 4.10 cm Ao Asc diam:  3.90 cm MITRAL VALVE                TRICUSPID VALVE MV Area (PHT): 3.42 cm     TR Peak grad:   29.4 mmHg MV Decel Time: 222 msec     TR Vmax:        271.00 cm/s MR Peak grad: 100.6 mmHg MR Vmax:      501.50 cm/s   SHUNTS MV E velocity: 78.60 cm/s   Systemic VTI:  0.26 m MV A velocity: 104.00 cm/s  Systemic Diam: 1.90 cm MV E/A ratio:  0.76 Dorris Carnes MD Electronically signed by Dorris Carnes MD Signature Date/Time: 01/03/2022/11:45:28 AM    Final    CT Angio Chest/Abd/Pel for Dissection W and/or W/WO  Result Date: 01/02/2022 CLINICAL DATA:  Acute aortic syndrome suspected.  Chest pain. EXAM: CT ANGIOGRAPHY CHEST, ABDOMEN AND PELVIS TECHNIQUE: Non-contrast CT of the chest was initially obtained. Multidetector CT imaging through the chest, abdomen and pelvis was performed using the standard protocol during bolus administration of intravenous contrast. Multiplanar reconstructed images and MIPs were obtained and reviewed to evaluate the vascular anatomy. RADIATION DOSE REDUCTION: This exam was performed according to the departmental  dose-optimization program which includes automated exposure control, adjustment of the mA and/or kV according to patient size and/or use of iterative reconstruction technique. CONTRAST:  72m OMNIPAQUE IOHEXOL 350 MG/ML SOLN COMPARISON:  Jun 30, 2021 FINDINGS: CTA CHEST FINDINGS Cardiovascular: Three-vessel coronary artery disease identified. Mild cardiomegaly. The main pulmonary artery measures 3.5 cm. No pulmonary emboli identified. The ascending thoracic aorta measures 4.3 by 4.6 cm by my measurement today versus 4.3 by 4.4 cm by my measurement previously. No dissection identified in the thoracic aorta. Mediastinum/Nodes: No pleural or pericardial effusion. Chest wall is unremarkable. No suspicious abnormalities identified in the thyroid. Ace tiny hiatal hernia is noted. The esophagus is normal. No adenopathy. Lungs/Pleura: Central airways are within normal limits. No pneumothorax. A new nodules identified in the right apex measuring 7 mm on series 8, image 34. A 3 mm nodule remains in the right base on series 8, image 101. A mildly nodular opacities identified in the lingula on series 8, image 65, new in the interval. No other new nodules. Mild interlobular septal thickening is identified in the upper lobes. Musculoskeletal: No chest wall abnormality. No acute or significant osseous findings. Review of the MIP images confirms the above findings. CTA ABDOMEN AND PELVIS FINDINGS VASCULAR Aorta: No aneurysmal dilatation identified in the abdominal aorta. No dissection. Atherosclerotic changes are noted. Celiac: Patent without evidence of aneurysm, dissection, vasculitis or significant stenosis. SMA: Patent without evidence of aneurysm, dissection, vasculitis or significant stenosis. Renals: Both renal arteries are patent without evidence of aneurysm, dissection, vasculitis, fibromuscular dysplasia or significant stenosis. IMA: Patent without evidence of aneurysm, dissection, vasculitis or significant stenosis.  Inflow: Patent without evidence of aneurysm, dissection, vasculitis or significant stenosis. Veins: No obvious  venous abnormality within the limitations of this arterial phase study. Review of the MIP images confirms the above findings. NON-VASCULAR Hepatobiliary: No focal liver abnormality is seen. No gallstones, gallbladder wall thickening, or biliary dilatation. Pancreas: Significant fatty replacement. No abnormal mass or evidence of pancreatitis. Spleen: Normal in size without focal abnormality. Adrenals/Urinary Tract: Adrenal glands are unremarkable. Kidneys are normal, without renal calculi, focal lesion, or hydronephrosis. Bladder is unremarkable. Stomach/Bowel: The stomach and small bowel are normal. Scattered colonic diverticulosis is identified without diverticulitis. The appendix is not visualized. Lymphatic: No lymphadenopathy identified. Reproductive: Status post hysterectomy. No adnexal masses. Other: No free air or free fluid. Musculoskeletal: No acute or significant osseous findings. Review of the MIP images confirms the above findings. IMPRESSION: 1. No aortic dissection identified. 2. The ascending thoracic aorta measures 4.3 x 4.6 cm by my measurement today versus 4.3 x 4.4 cm by my measurement previously. Ascending thoracic aortic aneurysm. Recommend semi-annual imaging followup by CTA or MRA and referral to cardiothoracic surgery if not already obtained. This recommendation follows 2010 ACCF/AHA/AATS/ACR/ASA/SCA/SCAI/SIR/STS/SVM Guidelines for the Diagnosis and Management of Patients With Thoracic Aortic Disease. Circulation. 2010; 121: A630-Z601. Aortic aneurysm NOS (ICD10-I71.9) 3. Three-vessel coronary artery disease. 4. The main pulmonary artery is mildly enlarged raising the possibility of pulmonary arterial hypertension. 5. There is a new 7 mm nodule in the right apex. Given the short interval follow-up, this is favored to be infectious or inflammatory rather than neoplastic. Per  Fleischner Society Guidelines, recommend a non-contrast Chest CT at 3-6 months, then consider another non-contrast Chest CT at 18-24 months. If patient is low risk for malignancy, non-contrast Chest CT at 18-24 months is optional. These guidelines do not apply to immunocompromised patients and patients with cancer. Follow up in patients with significant comorbidities as clinically warranted. For lung cancer screening, adhere to Lung-RADS guidelines. Reference: Radiology. 2017; 284(1):228-43. 6. Colonic diverticulosis without diverticulitis. 7. No other abnormalities. Electronically Signed   By: Dorise Bullion III M.D.   On: 01/02/2022 11:14   DG Chest 2 View  Result Date: 01/01/2022 CLINICAL DATA:  Atrial fibrillation EXAM: CHEST - 2 VIEW COMPARISON:  07/02/2021 FINDINGS: The heart size and mediastinal contours are within normal limits. Both lungs are clear. The visualized skeletal structures are unremarkable. IMPRESSION: No active cardiopulmonary disease. Electronically Signed   By: Ulyses Jarred M.D.   On: 01/01/2022 23:05    Cardiac Studies   2D echo 01/03/2022 IMPRESSIONS    1. Basal inferior hypokinesis/akinesis. Poor acoustic windows Definity  used.     Compared to echo from June 2023 basal inferior changes are more  prominent. Left ventricular ejection fraction, by estimation, is 55 to  60%. The left ventricle has normal function. Left ventricular diastolic  parameters are consistent with Grade I  diastolic dysfunction (impaired relaxation).   2. Right ventricular systolic function is low normal. The right  ventricular size is normal. There is normal pulmonary artery systolic  pressure.   3. Left atrial size was mildly dilated.   4. Mild mitral valve regurgitation.   5. AV is mildly thickened, calcified There is very mildly restricted  motion of the valve . Peak and mean gradients through the valve are 11 and  5 mm Hg respectively. No signficant change when compared to echo from June   2023. Marland Kitchen The aortic valve is  tricuspid.   6. There is mild dilatation of the ascending aorta, measuring 39 mm.   7. The inferior vena cava is normal in size with  greater than 50%  respiratory variability, suggesting right atrial pressure of 3 mmHg.    Patient Profile     88 y.o. female  with a hx of hypertension, type 2 diabetes, peptic ulcer disease, carotid artery disease, chronic diastolic heart failure, ascending thoracic aortic aneurysm, mitral valve prolapse, mitral regurgitation, remote cerebral aneurysm s/p coiling, chronic chest pain, colon cancer s/p resection, pulmonary nodule, who is being seen 01/02/2022 for the evaluation of A-fib RVR at the request of Dr. Loleta Books   Assessment & Plan    Expand All Collapse All      Cardiology Consultation    Patient ID: TISHIE ALTMANN MRN: [id number]; DOB: 10-20-1932   Admit date: 01/01/2022 Date of Consult: 01/02/2022   PCP:  Antony Contras, MD              [facility] Cardiologist:  Fransico Him, MD   {     Patient Profile:    LYNETTE TOPETE is a 88 y.o. female with a hx of hypertension, type 2 diabetes, peptic ulcer disease, carotid artery disease, chronic diastolic heart failure, ascending thoracic aortic aneurysm, mitral valve prolapse, mitral regurgitation, remote cerebral aneurysm s/p coiling, chronic chest pain, colon cancer s/p resection, pulmonary nodule, who is being seen 01/02/2022 for the evaluation of A-fib RVR at the request of Dr. Loleta Books.   History of Present Illness:    Ms. Aldaba with above past medical history presented to the ER yesterday complaining chest discomfort and heart palpitation.  She had reported that she is under a lot of stress associated with taking care of her husband who has dementia.  Her husband was released from the hospital yesterday.  She was walking around the house getting things ready.  Around 7 PM, she developed midsternal burning sensation, that is similar to her  chronic chest pain, and new onset of pain of her left arm.  She felt she was having a heart attack. She has never had similar feelings in the past. She states her symptom resolved after EMS arrived and gave ger IV cardizem. She is currently chest pain free. She does not have A fib in the past. She states Dr Radford Pax did want to do stress test with her after last one in 2017 but this did not happen. She is taking all of her meds, states her blood pressure can go up with emotional stress, denied any rapid weight gain.  She has chronic leg edema that worsens with ambulation and improves with resting and elevation.   She follows Dr. Radford Pax outpatient for cardiology reportedly has chronic chest pain and shortness of breath, most recent stress Myoview was done on 05/18/2015 revealed normal LVEF, low risk study, no ischemia.  Remote cardiac catheterization 2002 revealed normal coronary arteries.     She suffers chronic diastolic heart failure.  Most recent echo from 07/01/2021 with LVEF 60 to 65%, mild concentric LVH, normal RV, trivial MR, trivial AR, borderline aortic root dilatation 37 mm and moderate ascending aorta dilatation 41 mm.  She was most recently hospitalized for acute diastolic heart failure 05/09/8117 - 07/02/21 in the setting of hypertensive urgency with SBP 190s.  She was diuresed with IV Lasix and transitioned to as needed Lasix 20 mg daily at the time of discharge.    Event monitor from 07/2017 revealed sinus rhythm with occasional PVCs.   She was last seen by APP on 07/13/2021 at the office, continued to have chronic chest pain on and off without change in  quality.  She had improved lower extremity edema, she continued to have shortness of breath with minimal activity.  She was advised to take Lasix 20 mg daily with additional dose as needed when acute CHF symptoms occurs.  She was continued on spironolactone 12.5 mg daily, Entresto was felt not ideal and SGLT2 inhibitor was considered in the future.   Her antihypertensive regimen included Lasix 20 mg daily, hydralazine 10 mg 3 times daily, spironolactone 12.5 mg daily,trandolapril 4 mg BID.    Admission diagnostic since admission revealed elevated glucose 207, otherwise grossly unremarkable BMP.  Magnesium deficient 1.6 >1.6.  CBC revealed hemoglobin 11.8 >10.9, otherwise unremarkable.  INR 1.1.  BNP elevated to 34.  High sensitive troponin 132 >2092 >3162.  TSH WNL, chest x-ray revealed no acute finding.  Initial EKG on 01/01/2022 showed atrial fibrillation with RVR at 137 bpm, with associated generalized ST depression.  EKG today at 0012 revealedInitial EKG from 01/01/2022 at 2143 sinus rhythm at 72 bpm, PAC, T wave inversion of lead III and aVF, new comparing to EKG from 06/30/2021.  She was admitted to hospital medicine service, given IV diltiazem for A-fib rate control and started on Eliquis for anticoagulation.  Etiology is consulted today given new onset A-fib as well as uptrending troponin.  Echocardiogram is pending at this time.           Past Medical History:  Diagnosis Date   Abdominal pain, chronic, left lower quadrant     Acute torn meniscus of knee     Adenomatous colon polyp 1970    carcinoma in situ   Allergic rhinitis     Amaurosis fugax 08/07/2012   Anxiety     Ascending aortic aneurysm ([place]) 12/07/2015    73m by chest CTA 08/2020   Benign essential tremor syndrome     Bicuspid aortic valve      no AS on 07/2019   Carotid stenosis      1039 bilateral by dopplers 03/2017.    colon ca dx'd 1970    surg only   DDD (degenerative disc disease)     Diverticulitis     Diverticulosis     DM (diabetes mellitus) ([place]     Duodenitis      peptic, with gastric heterotopia   Endometriosis      s/p hysterectomy   Fundic gland polyposis of stomach     GERD (gastroesophageal reflux disease)     Heart murmur     Hiatal hernia 02/03/2005   History of cerebral aneurysm repair      s/p coiling   History of hemorrhoids      with  bleeding   History of shingles     HLD (hyperlipidemia)     HTN (hypertension)     Hypercholesteremia     IBS (irritable bowel syndrome)     Iron deficiency     Lung nodule 07/12/2021    CT 05/2021: 3 mm right solid pulmonary nodule-no routine follow-up imaging recommended   Migraine     MVP (mitral valve prolapse)     Osteopenia     Peripheral neuropathy     Toe fracture, right      second toe   UTI (lower urinary tract infection)     Varicose vein             Past Surgical History:  Procedure Laterality Date   ABDOMINAL HYSTERECTOMY       APPENDECTOMY       CARPAL TUNNEL  RELEASE   08/26/07   CATARACT EXTRACTION       CEREBRAL ANEURYSM REPAIR       COLON RESECTION       COLONOSCOPY   06/07/08    divertiulosis, internal hemorrhoids   COLONOSCOPY W/ BIOPSIES AND POLYPECTOMY   02/03/05    diverticulosis, 4 mm sessile polyps, internal and external hemorrhoids   CORONARY ANGIOPLASTY WITH STENT PLACEMENT       ESOPHAGOGASTRODUODENOSCOPY   02/03/05    hiatal hernia, 6 benign gastric polyps   FLEXIBLE SIGMOIDOSCOPY       HAND SURGERY Right     INTRAOCULAR LENS INSERTION       right hand decompressive fasciotomy   08/26/07    , dorsal and volar   TONSILLECTOMY          Home Medications:         Prior to Admission medications   Medication Sig Start Date End Date Taking? Authorizing Provider  ACCU-CHEK GUIDE test strip   02/23/21     [provider]  acetaminophen (TYLENOL) 500 MG tablet Take 500 mg by mouth every 6 (six) hours as needed for mild pain, moderate pain, fever or headache.       [provider]  Cholecalciferol (VITAMIN D-3) 1000 UNITS CAPS Take 3 capsules by mouth in the morning.       [provider]  furosemide (LASIX) 20 MG tablet TAKE ONE TAB EVERY MON, WED, FRI. TAKE ONE ADDITIONAL TAB IF WEIGHT GAIN OF 3+ LBS OVERNIGHT 11/02/21     Sueanne Margarita, MD  hydrALAZINE (APRESOLINE) 10 MG tablet Take 1 tablet (10 mg total) by mouth 3 (three) times  daily. 12/29/21     Sueanne Margarita, MD  hydrocortisone (ANUSOL-HC) 2.5 % rectal cream Apply to perianal area 3 times daily. Patient taking differently: Place rectally as needed for hemorrhoids or anal itching. Apply to perianal area 3 times daily. 11/14/17     Zehr, Janett Billow D, PA-C  insulin glargine (LANTUS) 100 UNIT/ML Solostar Pen Inject 20 Units into the skin daily with breakfast.       [provider]  Insulin Pen Needle (BD PEN NEEDLE NANO 2ND GEN) 32G X 4 MM MISC See admin instructions.       [provider]  Multiple Vitamin (MULTIVITAMIN) tablet Take 1 tablet by mouth daily.         [provider]  pantoprazole (PROTONIX) 40 MG tablet TAKE 1 TABLET BY MOUTH EVERY DAY BEFORE BREAKFAST 02/03/20     Gatha Mayer, MD  polyethylene glycol (MIRALAX / GLYCOLAX) 17 g packet Take 17 g by mouth daily as needed for mild constipation. 07/02/21     Raiford Noble Latif, DO  spironolactone (ALDACTONE) 25 MG tablet TAKE 1/2 TABLET BY MOUTH EVERY DAY 12/29/21     Sueanne Margarita, MD  trandolapril (MAVIK) 4 MG tablet TAKE 1 TABLET BY MOUTH 2 TIMES DAILY. 08/16/21     Sueanne Margarita, MD      Inpatient Medications: Scheduled Meds:  aspirin  324 mg Oral Once   carvedilol  6.25 mg Oral BID WC   ezetimibe  10 mg Oral Daily   [START ON 01/03/2022] furosemide  20 mg Oral Q M,W,F   hydrALAZINE  10 mg Oral TID   insulin aspart  0-5 Units Subcutaneous QHS   insulin aspart  0-6 Units Subcutaneous TID WC   insulin glargine-yfgn  14 Units Subcutaneous Daily   pantoprazole  40 mg Oral Daily   sodium chloride flush  3 mL Intravenous Q12H   spironolactone  12.5 mg Oral Daily   trandolapril  4 mg Oral BID    Continuous Infusions:  heparin      PRN Meds: acetaminophen **OR** acetaminophen, labetalol, ondansetron **OR** ondansetron (ZOFRAN) IV, polyethylene glycol   Allergies:         Allergies  Allergen Reactions   Actos [Pioglitazone]        Chronic  Pedal edema:contraindication    Avandia [Rosiglitazone]        Chronic pedal edema:contraindication   Fosamax [Alendronate Sodium]        unknown   Morphine Other (See Comments)   Ultram [Tramadol] Shortness Of Breath and Palpitations   Glimepiride        hypersensitivity   Januvia [Sitagliptin]        hypersensitivity   Lipitor [Atorvastatin]        Causes muscle pain and swelling   Metformin And Related        gastritis   Prandin [Repaglinide]        Abdominal pain: side effects   Tradjenta [Linagliptin]        GI problems, hypersensitivity   Zoloft [Sertraline Hcl]        Jittery or zombie like   Aspirin Other (See Comments)      Bleeding ulcers    Bentyl [Dicyclomine Hcl]        Came close to passing out. weak   Ciprofloxacin Nausea And Vomiting      Severe stomach upset   Dicyclomine        Other reaction(s): Unknown   Hctz [Hydrochlorothiazide] Other (See Comments)      URINARY ISSUES   Keflex [Cephalexin]     Morphine And Related Other (See Comments)      Body Spasms   Norvasc [Amlodipine Besylate]        Weak and dizzy.    Oxycodone     Sulfa Antibiotics Hives   Sulfamethoxazole-Trimethoprim        Other reaction(s): hives   Penicillins Swelling and Rash      Amoxicillin causes stomach upset.      Social History:   Social History         Socioeconomic History   Marital status: Married      Spouse name: Charles   Number of children: 0   Years of education: Not on file   Highest education level: Not on file  Occupational History   Occupation: retired      Fish farm manager: RETIRED  Tobacco Use   Smoking status: Never   Smokeless tobacco: Never  Vaping Use   Vaping Use: Never used  Substance and Sexual Activity   Alcohol use: No   Drug use: No   Sexual activity: Not on file  Other Topics Concern   Not on file  Social History Narrative    Lives with husband     Right handed    Caffeine: 2 c of coffee a day    Social Determinants of Health    Financial Resource Strain: Not on  file  Food Insecurity: Not on file  Transportation Needs: Not on file  Physical Activity: Not on file  Stress: Not on file  Social Connections: Not on file  Intimate Partner Violence: Not on file    Family History:          Family History  Problem Relation Age of Onset   Ovarian  cancer Mother     Heart disease Father     Kidney disease Father     Diabetes Paternal Grandmother     Diabetes Brother     Hypertension Brother     Heart disease Brother     Diabetes Brother     Heart disease Brother     Microcephaly Brother     Diabetes Other     Colon cancer Other        ROS:  Constitutional: Denied fever, chills, malaise, night sweats Eyes: Denied vision change or loss Ears/Nose/Mouth/Throat: Denied ear ache, sore throat, coughing, sinus pain Cardiovascular: see HPI  Respiratory: see HPI  Gastrointestinal: Denied nausea, vomiting, abdominal pain, diarrhea Genital/Urinary: Is not UTI Musculoskeletal: Denied muscle ache, joint pain, weakness Skin: Denied rash, wound Neuro: Denied headache, dizziness, syncope Psych: history of anxiety  Endocrine: history of diabetes .      Physical Exam/Data:          Vitals:    01/02/22 0200 01/02/22 0300 01/02/22 0720 01/02/22 0800  BP: (!) 146/75 (!) 157/68 (!) 119/101 (!) 197/85  Pulse: 63 64 69 72  Resp: 17 (!) 28 (!) 25 (!) 25  Temp:     97.6 F (36.4 C)    TempSrc:     Oral    SpO2: 97% 96% 97% 97%  Weight:          Height:            No intake or output data in the 24 hours ending 01/02/22 1056     01/01/2022    9:47 PM 07/13/2021   10:50 AM 06/30/2021   10:09 PM  Last 3 Weights  Weight (lbs) 148 lb 147 lb 9.6 oz 151 lb 7.3 oz  Weight (kg) 67.132 kg 66.951 kg 68.7 kg     Body mass index is 26.22 kg/m.  Vitals:      Vitals:    01/02/22 0720 01/02/22 0800  BP: (!) 119/101 (!) 197/85  Pulse: 69 72  Resp: (!) 25 (!) 25  Temp: 97.6 F (36.4 C)    SpO2: 97% 97%    General Appearance: In no apparent distress,  laying in bed HEENT: Normocephalic, atraumatic.  Neck: Supple, trachea midline, no JVDs Cardiovascular: Regular rate and rhythm, normal D7-O2, systolic murmur grade II LUSB Respiratory: Resting breathing unlabored, lungs sounds clear to auscultation bilaterally, no use of accessory muscles. On room air.  No wheezes, rales or rhonchi.   Gastrointestinal: Bowel sounds positive, abdomen soft Extremities: Able to move all extremities in bed without difficulty, nonpitting ankle edema bilaterally  Musculoskeletal: Normal muscle bulk and tone Skin: Intact, warm, dry. No rashes or petechiae noted in exposed areas.  Neurologic: Alert, oriented to person, place and time. Fluent speech, no cognitive deficit Psychiatric: Anxious            EKG:  The EKG was personally reviewed and demonstrates:     Initial EKG on 01/01/2022 showed atrial fibrillation with RVR at 137 bpm, with associated generalized ST depression.     EKG today at 0012 revealedInitial EKG from 01/01/2022 at 2143 sinus rhythm at 72 bpm, PAC, T wave inversion of lead III and aVF, new comparing to EKG from 06/30/2021.    Telemetry:  Telemetry was personally reviewed and demonstrates:     A-fib RVR 130s, converted at 10:41 PM on 01/01/2022 to sinus rhythm occasional PVCs   Relevant CV Studies:   Echocardiogram from 07/01/2021    1. Left ventricular  ejection fraction, by estimation, is 60 to 65%. The  left ventricle has normal function. The left ventricle has no regional  wall motion abnormalities. There is mild concentric left ventricular  hypertrophy. Left ventricular diastolic  parameters were normal.   2. Right ventricular systolic function is normal. The right ventricular  size is normal.   3. The mitral valve is normal in structure. Trivial mitral valve  regurgitation. No evidence of mitral stenosis.   4. The aortic valve is tricuspid. Aortic valve regurgitation is trivial.  No aortic stenosis is present.   5. There is  borderline dilatation of the aortic root, measuring 37 mm.  There is moderate dilatation of the ascending aorta, measuring 41 mm.   6. The inferior vena cava is normal in size with greater than 50%  respiratory variability, suggesting right atrial pressure of 3 mmHg.    Carotid Doppler 01/16/2018:   Summary:  Right Carotid: Velocities in the right ICA are consistent with a 1-39%  stenosis.   Left Carotid: Velocities in the left ICA are consistent with a 1-39%  stenosis.   Vertebrals: Bilateral vertebral arteries demonstrate antegrade flow.   *See table(s) above for measurements and observations.    Cardiac event monitor from 08/24/2017:   Normal rhythm with occasional PVCs     Stress Myoview from 05/18/2015:   The left ventricular ejection fraction is normal (55-65%). Nuclear stress EF: 71%. There was no ST segment deviation noted during stress. The study is normal. This is a low risk study.   Normal resting and stress perfusion no ischemia or infarction EF 64%      Laboratory Data:   High Sensitivity Troponin:   Last Labs        Recent Labs  Lab 01/01/22 2220 01/02/22 0232 01/02/22 0817 01/02/22 0918  TROPONINIHS 132* 2,092* 3,162* 2,858*       Chemistry Last Labs      Recent Labs  Lab 01/01/22 2220 01/02/22 0232  NA 140 141  K 3.5 4.0  CL 108 109  CO2 25 25  GLUCOSE 207* 97  BUN 21 20  CREATININE 0.89 0.80  CALCIUM 9.2 8.8*  MG 1.6* 1.6*  GFRNONAA >60 >60  ANIONGAP 7 7      Last Labs  No results for input(s): "PROT", "ALBUMIN", "AST", "ALT", "ALKPHOS", "BILITOT" in the last 168 hours.   Lipids  Last Labs  No results for input(s): "CHOL", "TRIG", "HDL", "LABVLDL", "LDLCALC", "CHOLHDL" in the last 168 hours.    Hematology Last Labs      Recent Labs  Lab 01/01/22 2220 01/02/22 0918  WBC 8.4 7.3  RBC 4.76 4.42  HGB 11.8* 10.9*  HCT 36.8 34.3*  MCV 77.3* 77.6*  MCH 24.8* 24.7*  MCHC 32.1 31.8  RDW 16.1* 16.5*  PLT 225 202       Thyroid  Last Labs     Recent Labs  Lab 01/01/22 2220  TSH 0.899      BNP Last Labs     Recent Labs  Lab 01/01/22 2220  BNP 234.2*      DDimer  Last Labs  No results for input(s): "DDIMER" in the last 168 hours.       Radiology/Studies:  DG Chest 2 View   Result Date: 01/01/2022 CLINICAL DATA:  Atrial fibrillation EXAM: CHEST - 2 VIEW COMPARISON:  07/02/2021 FINDINGS: The heart size and mediastinal contours are within normal limits. Both lungs are clear. The visualized skeletal structures are unremarkable. IMPRESSION: No active cardiopulmonary  disease. Electronically Signed   By: Ulyses Jarred M.D.   On: 01/01/2022 23:05       Assessment and Plan:    Elevated troponin with NTEMI secondary to demand ischemia -Presented with chest discomfort , in the onset setting of A-fib RVR -High sensitive troponin 132> 2092>3162 >2858 -EKG initially revealed A-fib RVR, now with conversion to sinus rhythm, new TWI of lead II and aVF  -She had remote negative ischemic evaluation in 2002 cardiac cath and 2017 stress Myoview  -Possible demand ischemia from A-fib RVR and hypertensive urgency, unable to rule out ischemic etiology, discussed cardiac catheterization, patient is not willing to accept the potential risk and wishes conservative medical therapy, she is currently chest pain-free and spontaneously converted from A-fib RVR to sinus rhythm -Consider outpatient noninvasive ischemic workup but only if proceed with cardiac cath if findings are abnormal -No aspirin secondary to DOAC -She is statin intolerant -TSH WNL -Starting Zetia 10 mg daily for elevated LDL at 92 -TSH normal -Hemoglobin A1c mildly elevated at 6.2% and will need follow-up with PCP   Paroxysmal atrial fibrillation with RVR, new diagnosis - per initial EKG and telemetry -TSH WNL -Spontaneously converted to sinus rhythm, did receive IV diltiazem 10 mg at ED -Remains in normal sinus rhythm on carvedilol 6.25 mg twice  daily started this admission  -Given CHA2DS2-VASc score of 7, agree with anticoagulation with Eliquis 5 mg twice daily   Hypertensive urgency -Blood pressure has been elevated up to 197/85 since admission -PTA Lasix 20 mg daily, hydralazine 10 mg 3 times daily, spironolactone 12.5 mg daily, trandolapril 4 mg twice daily -Carvedilol 6.25 mg twice daily added this admission -Continue 3 times weekly Lasix 20 mg daily -hydralazine was increased to 25 mg 3 times daily this admission -Continue spironolactone 12.5 mg daily and trandolapril 4 mg twice daily   Chronic diastolic heart failure - BNP elevated 234 - CXR no acute finding - 2D echo with normal LV function - Appears euvolemic on exam - recommend continue PTA lasix, spironolactone and trandolapril, added carvedilol as above - goal is to aim ideal blood pressure control   Ascending thoracic aortic aneurysm - last CTA chest 06/30/21 unchanged 4.4 cm ascending aortic aneurysm  - repeat CTA chest this admission showed no evidence of aortic dissection and measured 4.4 x 4.6 cm -Recommend repeat imaging in 1 year   Type 2 diabetes Peptic ulcer disease Anemia Anxiety History of cerebral aneurysm status post coiling -Per internal medicine      [facility] will sign off.   Medication Recommendations: Eliquis 5 mg twice daily, Zetia 10 mg daily, carvedilol 6.25 mg twice daily, Lasix every Monday Wednesday and Friday, hydralazine 25 mg 3 times daily, spironolactone 12.5 mg daily and trandolapril 4 mg twice daily Other recommendations (labs, testing, etc): None Follow up as an outpatient: A-fib clinic in 2 weeks  For questions or updates, please contact [place] Please consult [email or web address] for contact info under    I have spent a total of 40 minutes with patient reviewing 2D echo , telemetry, EKGs, labs and examining patient as well as establishing an assessment and plan that was discussed with the patient.  > 50% of time  was spent in direct patient care.      Signed, Fransico Him, MD  01/03/2022, 3:07 PM

## 2022-01-03 NOTE — TOC Benefit Eligibility Note (Signed)
Patient Research scientist (life sciences) completed.     The patient is currently admitted and upon discharge could be taking Eliquis '5mg'$  or Xarelto '20mg'$ .   The current 30 day co-pay is, $40 for either.   The patient is insured through [employer].

## 2022-01-03 NOTE — Plan of Care (Signed)
  Problem: Education: Goal: Knowledge of General Education information will improve Description: Including pain rating scale, medication(s)/side effects and non-pharmacologic comfort measures 01/03/2022 0049 by Kaylyn Lim, RN Outcome: Progressing 01/03/2022 0049 by Kaylyn Lim, RN Outcome: Progressing   Problem: Health Behavior/Discharge Planning: Goal: Ability to manage health-related needs will improve 01/03/2022 0049 by Kaylyn Lim, RN Outcome: Progressing 01/03/2022 0049 by Kaylyn Lim, RN Outcome: Progressing   Problem: Clinical Measurements: Goal: Cardiovascular complication will be avoided 01/03/2022 0049 by Kaylyn Lim, RN Outcome: Progressing   Problem: Coping: Goal: Level of anxiety will decrease Outcome: Progressing   Problem: Pain Managment: Goal: General experience of comfort will improve Outcome: Progressing

## 2022-01-05 ENCOUNTER — Other Ambulatory Visit: Payer: Self-pay | Admitting: Cardiology

## 2022-01-17 ENCOUNTER — Ambulatory Visit (HOSPITAL_COMMUNITY): Payer: Medicare PPO | Admitting: Nurse Practitioner

## 2022-01-21 DIAGNOSIS — E559 Vitamin D deficiency, unspecified: Secondary | ICD-10-CM | POA: Diagnosis not present

## 2022-01-21 DIAGNOSIS — E782 Mixed hyperlipidemia: Secondary | ICD-10-CM | POA: Diagnosis not present

## 2022-01-21 DIAGNOSIS — D6869 Other thrombophilia: Secondary | ICD-10-CM | POA: Diagnosis not present

## 2022-01-21 DIAGNOSIS — E1140 Type 2 diabetes mellitus with diabetic neuropathy, unspecified: Secondary | ICD-10-CM | POA: Diagnosis not present

## 2022-01-21 DIAGNOSIS — I10 Essential (primary) hypertension: Secondary | ICD-10-CM | POA: Diagnosis not present

## 2022-01-21 DIAGNOSIS — Z23 Encounter for immunization: Secondary | ICD-10-CM | POA: Diagnosis not present

## 2022-01-21 DIAGNOSIS — N1831 Chronic kidney disease, stage 3a: Secondary | ICD-10-CM | POA: Diagnosis not present

## 2022-01-21 DIAGNOSIS — I480 Paroxysmal atrial fibrillation: Secondary | ICD-10-CM | POA: Diagnosis not present

## 2022-01-21 DIAGNOSIS — K589 Irritable bowel syndrome without diarrhea: Secondary | ICD-10-CM | POA: Diagnosis not present

## 2022-01-25 ENCOUNTER — Other Ambulatory Visit: Payer: Self-pay

## 2022-01-25 ENCOUNTER — Telehealth: Payer: Self-pay | Admitting: Cardiology

## 2022-01-25 DIAGNOSIS — R001 Bradycardia, unspecified: Secondary | ICD-10-CM

## 2022-01-25 NOTE — Telephone Encounter (Signed)
   STAT if HR is under 50 or over 120 (normal HR is 60-100 beats per minute)  What is your heart rate? About 10 minutes ago -  37   Do you have a log of your heart rate readings (document readings)?  Up to walk - 90's  When she sat down - 37   Do you have any other symptoms? No

## 2022-01-25 NOTE — Telephone Encounter (Signed)
Called Ty, the home health nurse and she reported that the patient's pulse at rest was 44 when taken manually. When she exert's herself her pulse will go up to the 60's and 70's. Her current blood pressure is 150/64. Ty stated that the patient feels sleepy but she did not get much sleep the night before. She has a history of A-fib and is not dizzy or short of breath. Please advise.

## 2022-01-26 ENCOUNTER — Telehealth: Payer: Self-pay | Admitting: Cardiology

## 2022-01-26 ENCOUNTER — Ambulatory Visit: Payer: Medicare PPO | Attending: Cardiology

## 2022-01-26 DIAGNOSIS — R001 Bradycardia, unspecified: Secondary | ICD-10-CM

## 2022-01-26 NOTE — Telephone Encounter (Signed)
Called left a message for Ty to call our office.  Called pt reviewed heart monitor instructions.  tressed to pt the importance of pushing button on monitor if becomes symptomatic. Pt reports has someone that can assist with applying monitor.  Advised pt to call in with any questions or concerns.

## 2022-01-26 NOTE — Telephone Encounter (Signed)
Spoke with Ty advised of MD recommendation to wear a 2 week heart monitor.  Advised will come to pt in the mail in 3-5 business days.  Asked if someone with [facility] will be able to assist pt if needed.  Ty is unsure thinks pt has 1 nurse visit weekly.  Ty will f/u with pt in regards to heart monitor.

## 2022-01-26 NOTE — Telephone Encounter (Signed)
Patient is calling and is wanting to know if she can start back taking apixaban (ELIQUIS) 5 MG TABS tablet ; carvedilol (COREG) 6.25 MG tablet  ; trandolapril (MAVIK) 4 MG tablet . Please call back to verify.

## 2022-01-26 NOTE — Progress Notes (Unsigned)
Enrolled patient for a 14 day Zio XT  monitor to be mailed to patients home  °

## 2022-01-26 NOTE — Telephone Encounter (Signed)
Called pt reports HR is in the 70's but still feels really weak.  Advised pt not to take carvedilol.  Check BP daily call in readings after 1 week.   Pt wants to know if still needs to take hydralazine.   Advised pt for tonight not to take med unless BP is greater than 140 will send to MD to advise.

## 2022-01-26 NOTE — Telephone Encounter (Signed)
Called pt in regards to when to restart  Eliquis, carvedilol and trandolapril.   Pt recently seen in ED on 01/01/22 for Afib  Pt reports was told by [facility] worker to stop taking medications listed above d/t HR in the 40's.    Advised pt to take Eliquis advised pt this medication is a blood thinner and prevents stroke d/t Afib.  Last took Eliquis morning of 12/26.   BP today 124/78-86.  Advised pt ok to take trandolapril 4 mg.  Also advised will have to start checking HR prior to taking carvedilol. If below 60 do not take.    Will send to MD to address when pt should resume carvedilol and hydralazine.

## 2022-01-27 ENCOUNTER — Ambulatory Visit (HOSPITAL_COMMUNITY): Payer: Medicare PPO | Admitting: Nurse Practitioner

## 2022-01-27 NOTE — Telephone Encounter (Signed)
Called pt advised of MD recommendation to take hydralazine if SBP >140.  Pt not to take carvedilol.  Pt will check BP twice daily and call in readings in 1 week.  Pt reports still feels very weak and will not be able to come to OV with Roderic Palau today at 3 pm.  Sent a message to afib clinic scheduler to f/u with pt.

## 2022-02-01 ENCOUNTER — Other Ambulatory Visit (HOSPITAL_COMMUNITY): Payer: Self-pay

## 2022-02-01 ENCOUNTER — Other Ambulatory Visit: Payer: Self-pay

## 2022-02-03 ENCOUNTER — Other Ambulatory Visit: Payer: Self-pay

## 2022-02-03 ENCOUNTER — Emergency Department (HOSPITAL_COMMUNITY): Payer: Medicare PPO

## 2022-02-03 ENCOUNTER — Encounter (HOSPITAL_COMMUNITY): Payer: Self-pay

## 2022-02-03 ENCOUNTER — Inpatient Hospital Stay (HOSPITAL_COMMUNITY)
Admission: EM | Admit: 2022-02-03 | Discharge: 2022-02-18 | DRG: 308 | Disposition: A | Discharge status: Skilled Nursing Facility | Payer: Medicare PPO | Attending: Internal Medicine | Admitting: Internal Medicine

## 2022-02-03 DIAGNOSIS — I341 Nonrheumatic mitral (valve) prolapse: Secondary | ICD-10-CM | POA: Diagnosis present

## 2022-02-03 DIAGNOSIS — Z888 Allergy status to other drugs, medicaments and biological substances status: Secondary | ICD-10-CM

## 2022-02-03 DIAGNOSIS — Z86010 Personal history of colonic polyps: Secondary | ICD-10-CM

## 2022-02-03 DIAGNOSIS — Z79899 Other long term (current) drug therapy: Secondary | ICD-10-CM

## 2022-02-03 DIAGNOSIS — M8580 Other specified disorders of bone density and structure, unspecified site: Secondary | ICD-10-CM | POA: Diagnosis present

## 2022-02-03 DIAGNOSIS — K219 Gastro-esophageal reflux disease without esophagitis: Secondary | ICD-10-CM | POA: Diagnosis present

## 2022-02-03 DIAGNOSIS — I77810 Thoracic aortic ectasia: Secondary | ICD-10-CM | POA: Diagnosis present

## 2022-02-03 DIAGNOSIS — I951 Orthostatic hypotension: Secondary | ICD-10-CM | POA: Diagnosis present

## 2022-02-03 DIAGNOSIS — Z1152 Encounter for screening for COVID-19: Secondary | ICD-10-CM | POA: Diagnosis not present

## 2022-02-03 DIAGNOSIS — Z66 Do not resuscitate: Secondary | ICD-10-CM | POA: Diagnosis present

## 2022-02-03 DIAGNOSIS — J9601 Acute respiratory failure with hypoxia: Secondary | ICD-10-CM | POA: Insufficient documentation

## 2022-02-03 DIAGNOSIS — I160 Hypertensive urgency: Secondary | ICD-10-CM | POA: Diagnosis present

## 2022-02-03 DIAGNOSIS — R000 Tachycardia, unspecified: Secondary | ICD-10-CM | POA: Diagnosis not present

## 2022-02-03 DIAGNOSIS — A084 Viral intestinal infection, unspecified: Secondary | ICD-10-CM | POA: Diagnosis present

## 2022-02-03 DIAGNOSIS — E7800 Pure hypercholesterolemia, unspecified: Secondary | ICD-10-CM | POA: Diagnosis present

## 2022-02-03 DIAGNOSIS — E1165 Type 2 diabetes mellitus with hyperglycemia: Secondary | ICD-10-CM | POA: Diagnosis not present

## 2022-02-03 DIAGNOSIS — R0902 Hypoxemia: Secondary | ICD-10-CM | POA: Diagnosis not present

## 2022-02-03 DIAGNOSIS — I2489 Other forms of acute ischemic heart disease: Secondary | ICD-10-CM | POA: Diagnosis present

## 2022-02-03 DIAGNOSIS — I499 Cardiac arrhythmia, unspecified: Secondary | ICD-10-CM | POA: Diagnosis not present

## 2022-02-03 DIAGNOSIS — G8929 Other chronic pain: Secondary | ICD-10-CM | POA: Diagnosis present

## 2022-02-03 DIAGNOSIS — R1032 Left lower quadrant pain: Secondary | ICD-10-CM | POA: Diagnosis not present

## 2022-02-03 DIAGNOSIS — F419 Anxiety disorder, unspecified: Secondary | ICD-10-CM | POA: Diagnosis present

## 2022-02-03 DIAGNOSIS — I5030 Unspecified diastolic (congestive) heart failure: Secondary | ICD-10-CM | POA: Insufficient documentation

## 2022-02-03 DIAGNOSIS — R110 Nausea: Secondary | ICD-10-CM | POA: Diagnosis not present

## 2022-02-03 DIAGNOSIS — Z8041 Family history of malignant neoplasm of ovary: Secondary | ICD-10-CM

## 2022-02-03 DIAGNOSIS — E1142 Type 2 diabetes mellitus with diabetic polyneuropathy: Secondary | ICD-10-CM | POA: Diagnosis present

## 2022-02-03 DIAGNOSIS — R7989 Other specified abnormal findings of blood chemistry: Secondary | ICD-10-CM | POA: Diagnosis present

## 2022-02-03 DIAGNOSIS — R531 Weakness: Secondary | ICD-10-CM | POA: Diagnosis not present

## 2022-02-03 DIAGNOSIS — Z8619 Personal history of other infectious and parasitic diseases: Secondary | ICD-10-CM

## 2022-02-03 DIAGNOSIS — E1169 Type 2 diabetes mellitus with other specified complication: Secondary | ICD-10-CM

## 2022-02-03 DIAGNOSIS — R1084 Generalized abdominal pain: Secondary | ICD-10-CM | POA: Diagnosis not present

## 2022-02-03 DIAGNOSIS — R0602 Shortness of breath: Secondary | ICD-10-CM | POA: Diagnosis not present

## 2022-02-03 DIAGNOSIS — Z794 Long term (current) use of insulin: Secondary | ICD-10-CM | POA: Diagnosis not present

## 2022-02-03 DIAGNOSIS — Z885 Allergy status to narcotic agent status: Secondary | ICD-10-CM

## 2022-02-03 DIAGNOSIS — Z7901 Long term (current) use of anticoagulants: Secondary | ICD-10-CM

## 2022-02-03 DIAGNOSIS — I10 Essential (primary) hypertension: Secondary | ICD-10-CM | POA: Diagnosis not present

## 2022-02-03 DIAGNOSIS — E119 Type 2 diabetes mellitus without complications: Secondary | ICD-10-CM

## 2022-02-03 DIAGNOSIS — Z880 Allergy status to penicillin: Secondary | ICD-10-CM

## 2022-02-03 DIAGNOSIS — Z841 Family history of disorders of kidney and ureter: Secondary | ICD-10-CM

## 2022-02-03 DIAGNOSIS — K760 Fatty (change of) liver, not elsewhere classified: Secondary | ICD-10-CM | POA: Diagnosis present

## 2022-02-03 DIAGNOSIS — Z8711 Personal history of peptic ulcer disease: Secondary | ICD-10-CM

## 2022-02-03 DIAGNOSIS — R112 Nausea with vomiting, unspecified: Secondary | ICD-10-CM | POA: Diagnosis not present

## 2022-02-03 DIAGNOSIS — N179 Acute kidney failure, unspecified: Secondary | ICD-10-CM | POA: Diagnosis not present

## 2022-02-03 DIAGNOSIS — I671 Cerebral aneurysm, nonruptured: Secondary | ICD-10-CM | POA: Diagnosis present

## 2022-02-03 DIAGNOSIS — E1151 Type 2 diabetes mellitus with diabetic peripheral angiopathy without gangrene: Secondary | ICD-10-CM | POA: Diagnosis present

## 2022-02-03 DIAGNOSIS — G250 Essential tremor: Secondary | ICD-10-CM | POA: Diagnosis present

## 2022-02-03 DIAGNOSIS — R279 Unspecified lack of coordination: Secondary | ICD-10-CM | POA: Diagnosis not present

## 2022-02-03 DIAGNOSIS — I5033 Acute on chronic diastolic (congestive) heart failure: Secondary | ICD-10-CM | POA: Diagnosis not present

## 2022-02-03 DIAGNOSIS — Z8249 Family history of ischemic heart disease and other diseases of the circulatory system: Secondary | ICD-10-CM

## 2022-02-03 DIAGNOSIS — R197 Diarrhea, unspecified: Secondary | ICD-10-CM | POA: Diagnosis present

## 2022-02-03 DIAGNOSIS — M6281 Muscle weakness (generalized): Secondary | ICD-10-CM | POA: Diagnosis not present

## 2022-02-03 DIAGNOSIS — Z85038 Personal history of other malignant neoplasm of large intestine: Secondary | ICD-10-CM

## 2022-02-03 DIAGNOSIS — Z800 Family history of malignant neoplasm of digestive organs: Secondary | ICD-10-CM

## 2022-02-03 DIAGNOSIS — R2689 Other abnormalities of gait and mobility: Secondary | ICD-10-CM | POA: Diagnosis not present

## 2022-02-03 DIAGNOSIS — Z833 Family history of diabetes mellitus: Secondary | ICD-10-CM

## 2022-02-03 DIAGNOSIS — Z955 Presence of coronary angioplasty implant and graft: Secondary | ICD-10-CM

## 2022-02-03 DIAGNOSIS — Z882 Allergy status to sulfonamides status: Secondary | ICD-10-CM

## 2022-02-03 DIAGNOSIS — E875 Hyperkalemia: Secondary | ICD-10-CM | POA: Diagnosis not present

## 2022-02-03 DIAGNOSIS — I110 Hypertensive heart disease with heart failure: Secondary | ICD-10-CM | POA: Diagnosis not present

## 2022-02-03 DIAGNOSIS — R079 Chest pain, unspecified: Secondary | ICD-10-CM | POA: Diagnosis present

## 2022-02-03 DIAGNOSIS — Z886 Allergy status to analgesic agent status: Secondary | ICD-10-CM

## 2022-02-03 DIAGNOSIS — Z7401 Bed confinement status: Secondary | ICD-10-CM | POA: Diagnosis not present

## 2022-02-03 DIAGNOSIS — Z8673 Personal history of transient ischemic attack (TIA), and cerebral infarction without residual deficits: Secondary | ICD-10-CM

## 2022-02-03 DIAGNOSIS — I480 Paroxysmal atrial fibrillation: Principal | ICD-10-CM | POA: Diagnosis present

## 2022-02-03 DIAGNOSIS — I4891 Unspecified atrial fibrillation: Secondary | ICD-10-CM | POA: Diagnosis not present

## 2022-02-03 DIAGNOSIS — R001 Bradycardia, unspecified: Secondary | ICD-10-CM | POA: Diagnosis present

## 2022-02-03 DIAGNOSIS — R1311 Dysphagia, oral phase: Secondary | ICD-10-CM | POA: Diagnosis not present

## 2022-02-03 DIAGNOSIS — R1111 Vomiting without nausea: Secondary | ICD-10-CM | POA: Diagnosis not present

## 2022-02-03 DIAGNOSIS — M6259 Muscle wasting and atrophy, not elsewhere classified, multiple sites: Secondary | ICD-10-CM | POA: Diagnosis not present

## 2022-02-03 LAB — TROPONIN I (HIGH SENSITIVITY)
Troponin I (High Sensitivity): 163 ng/L
Troponin I (High Sensitivity): 147 ng/L (ref <18)

## 2022-02-03 LAB — COMPREHENSIVE METABOLIC PANEL
ALT: 51 U/L — ABNORMAL HIGH (ref 0–44)
AST: 49 U/L — ABNORMAL HIGH (ref 15–41)
Albumin: 3.7 g/dL (ref 3.5–5.0)
Alkaline Phosphatase: 56 U/L (ref 38–126)
Anion gap: 11 (ref 5–15)
BUN: 14 mg/dL (ref 8–23)
CO2: 25 mmol/L (ref 22–32)
Calcium: 8.8 mg/dL — ABNORMAL LOW (ref 8.9–10.3)
Chloride: 102 mmol/L (ref 98–111)
Creatinine, Ser: 0.83 mg/dL (ref 0.44–1.00)
GFR, Estimated: >60 mL/min (ref >60)
Glucose, Bld: 130 mg/dL — ABNORMAL HIGH (ref 70–99)
Potassium: 3.8 mmol/L (ref 3.5–5.1)
Sodium: 138 mmol/L (ref 135–145)
Total Bilirubin: 0.7 mg/dL (ref 0.3–1.2)
Total Protein: 6.4 g/dL — ABNORMAL LOW (ref 6.5–8.1)

## 2022-02-03 LAB — CBC WITH DIFFERENTIAL/PLATELET
Abs Immature Granulocytes: 0.02 10*3/uL (ref 0.00–0.07)
Basophils Absolute: 0 10*3/uL (ref 0.0–0.1)
Basophils Relative: 1 %
Eosinophils Absolute: 0.1 10*3/uL (ref 0.0–0.5)
Eosinophils Relative: 1 %
HCT: 36.1 % (ref 36.0–46.0)
Hemoglobin: 11.7 g/dL — ABNORMAL LOW (ref 12.0–15.0)
Immature Granulocytes: 0 %
Lymphocytes Relative: 37 %
Lymphs Abs: 2.4 10*3/uL (ref 0.7–4.0)
MCH: 24.5 pg — ABNORMAL LOW (ref 26.0–34.0)
MCHC: 32.4 g/dL (ref 30.0–36.0)
MCV: 75.5 fL — ABNORMAL LOW (ref 80.0–100.0)
Monocytes Absolute: 0.4 10*3/uL (ref 0.1–1.0)
Monocytes Relative: 6 %
Neutro Abs: 3.6 10*3/uL (ref 1.7–7.7)
Neutrophils Relative %: 55 %
Platelets: 188 10*3/uL (ref 150–400)
RBC: 4.78 MIL/uL (ref 3.87–5.11)
RDW: 16.2 % — ABNORMAL HIGH (ref 11.5–15.5)
WBC: 6.5 10*3/uL (ref 4.0–10.5)
nRBC: 0 % (ref 0.0–0.2)

## 2022-02-03 LAB — URINALYSIS, ROUTINE W REFLEX MICROSCOPIC
Bacteria, UA: NONE SEEN
Bilirubin Urine: NEGATIVE
Glucose, UA: NEGATIVE mg/dL
Ketones, ur: 20 mg/dL — AB
Nitrite: NEGATIVE
Protein, ur: 100 mg/dL — AB
Specific Gravity, Urine: 1.035 — ABNORMAL HIGH (ref 1.005–1.030)
pH: 5 (ref 5.0–8.0)

## 2022-02-03 LAB — LIPASE, BLOOD: Lipase: 23 U/L (ref 11–51)

## 2022-02-03 LAB — CBG MONITORING, ED
Glucose-Capillary: 120 mg/dL — ABNORMAL HIGH (ref 70–99)
Glucose-Capillary: 102 mg/dL — ABNORMAL HIGH (ref 70–99)
Glucose-Capillary: 112 mg/dL — ABNORMAL HIGH (ref 70–99)

## 2022-02-03 LAB — BRAIN NATRIURETIC PEPTIDE: B Natriuretic Peptide: 970.6 pg/mL — ABNORMAL HIGH (ref 0.0–100.0)

## 2022-02-03 LAB — RESP PANEL BY RT-PCR (RSV, FLU A&B, COVID)  RVPGX2
Influenza A by PCR: NEGATIVE
Influenza B by PCR: NEGATIVE
Resp Syncytial Virus by PCR: NEGATIVE
SARS Coronavirus 2 by RT PCR: NEGATIVE

## 2022-02-03 LAB — D-DIMER, QUANTITATIVE: D-Dimer, Quant: 0.56 ug{FEU}/mL — ABNORMAL HIGH (ref 0.00–0.50)

## 2022-02-03 MED ORDER — HYDRALAZINE HCL 20 MG/ML IJ SOLN
5.0000 mg | Freq: Four times a day (QID) | INTRAMUSCULAR | Status: DC | PRN
  Administered 2022-02-04 – 2022-02-05 (×3): 5 mg via INTRAVENOUS
  Filled 2022-02-03 (×3): qty 1

## 2022-02-03 MED ORDER — SODIUM CHLORIDE 0.9 % IV BOLUS
500.0000 mL | Freq: Once | INTRAVENOUS | Status: AC
  Administered 2022-02-03: 500 mL via INTRAVENOUS

## 2022-02-03 MED ORDER — INSULIN ASPART 100 UNIT/ML IJ SOLN
0.0000 [IU] | Freq: Every day | INTRAMUSCULAR | Status: DC
  Administered 2022-02-05 – 2022-02-17 (×4): 2 [IU] via SUBCUTANEOUS

## 2022-02-03 MED ORDER — INSULIN ASPART 100 UNIT/ML IJ SOLN
0.0000 [IU] | Freq: Three times a day (TID) | INTRAMUSCULAR | Status: DC
  Administered 2022-02-04: 2 [IU] via SUBCUTANEOUS
  Administered 2022-02-04 – 2022-02-05 (×2): 1 [IU] via SUBCUTANEOUS
  Administered 2022-02-05: 2 [IU] via SUBCUTANEOUS
  Administered 2022-02-06 (×2): 1 [IU] via SUBCUTANEOUS
  Administered 2022-02-06: 3 [IU] via SUBCUTANEOUS
  Administered 2022-02-07: 1 [IU] via SUBCUTANEOUS
  Administered 2022-02-07 – 2022-02-08 (×4): 2 [IU] via SUBCUTANEOUS
  Administered 2022-02-09 (×2): 1 [IU] via SUBCUTANEOUS
  Administered 2022-02-09: 2 [IU] via SUBCUTANEOUS
  Administered 2022-02-10: 1 [IU] via SUBCUTANEOUS
  Administered 2022-02-10: 2 [IU] via SUBCUTANEOUS
  Administered 2022-02-11 (×2): 1 [IU] via SUBCUTANEOUS
  Administered 2022-02-12: 2 [IU] via SUBCUTANEOUS
  Administered 2022-02-12: 1 [IU] via SUBCUTANEOUS
  Administered 2022-02-13: 3 [IU] via SUBCUTANEOUS
  Administered 2022-02-13 – 2022-02-14 (×2): 1 [IU] via SUBCUTANEOUS
  Administered 2022-02-14: 2 [IU] via SUBCUTANEOUS
  Administered 2022-02-14 – 2022-02-15 (×2): 1 [IU] via SUBCUTANEOUS
  Administered 2022-02-15: 3 [IU] via SUBCUTANEOUS
  Administered 2022-02-15: 2 [IU] via SUBCUTANEOUS
  Administered 2022-02-16: 3 [IU] via SUBCUTANEOUS
  Administered 2022-02-16: 1 [IU] via SUBCUTANEOUS
  Administered 2022-02-16: 2 [IU] via SUBCUTANEOUS
  Administered 2022-02-17: 3 [IU] via SUBCUTANEOUS
  Administered 2022-02-17: 1 [IU] via SUBCUTANEOUS
  Administered 2022-02-17 – 2022-02-18 (×2): 2 [IU] via SUBCUTANEOUS
  Administered 2022-02-18: 3 [IU] via SUBCUTANEOUS

## 2022-02-03 MED ORDER — ACETAMINOPHEN 650 MG RE SUPP: 650.0000 mg | Freq: Four times a day (QID) | RECTAL | Status: DC | PRN

## 2022-02-03 MED ORDER — HYDRALAZINE HCL 20 MG/ML IJ SOLN
10.0000 mg | Freq: Once | INTRAMUSCULAR | Status: AC
  Administered 2022-02-03: 10 mg via INTRAVENOUS
  Filled 2022-02-03: qty 1

## 2022-02-03 MED ORDER — IOHEXOL 350 MG/ML SOLN
75.0000 mL | Freq: Once | INTRAVENOUS | Status: AC | PRN
  Administered 2022-02-03: 75 mL via INTRAVENOUS

## 2022-02-03 MED ORDER — ONDANSETRON HCL 4 MG/2ML IJ SOLN
4.0000 mg | Freq: Four times a day (QID) | INTRAMUSCULAR | Status: DC | PRN
  Administered 2022-02-04 – 2022-02-09 (×14): 4 mg via INTRAVENOUS
  Filled 2022-02-03 (×16): qty 2

## 2022-02-03 MED ORDER — ACETAMINOPHEN 325 MG PO TABS
650.0000 mg | ORAL_TABLET | Freq: Four times a day (QID) | ORAL | Status: DC | PRN
  Administered 2022-02-06 – 2022-02-17 (×13): 650 mg via ORAL
  Filled 2022-02-03 (×13): qty 2

## 2022-02-03 MED ORDER — ONDANSETRON HCL 4 MG/2ML IJ SOLN
4.0000 mg | Freq: Once | INTRAMUSCULAR | Status: AC
  Administered 2022-02-03: 4 mg via INTRAVENOUS
  Filled 2022-02-03: qty 2

## 2022-02-03 MED ORDER — ACETAMINOPHEN 500 MG PO TABS
1000.0000 mg | ORAL_TABLET | Freq: Once | ORAL | Status: AC
  Administered 2022-02-03: 1000 mg via ORAL
  Filled 2022-02-03: qty 2

## 2022-02-03 MED ORDER — FUROSEMIDE 10 MG/ML IJ SOLN
20.0000 mg | Freq: Once | INTRAMUSCULAR | Status: AC
  Administered 2022-02-03: 20 mg via INTRAVENOUS
  Filled 2022-02-03: qty 2

## 2022-02-03 NOTE — ED Triage Notes (Addendum)
Pt c/o N/V/Dx2d. Pt states she vomited one time in 24 hrs. Pt states had diarrhea 4 times. EMS gave zofran '4mg'$  IV

## 2022-02-03 NOTE — ED Notes (Signed)
PT returned from CT

## 2022-02-03 NOTE — ED Provider Triage Note (Addendum)
Emergency Medicine Provider Triage Evaluation Note  [name] , a 89 y.o. female  was evaluated in triage.  Pt complains of nausea, vomiting, diarrhea x 2 days. Notes that she has had one episode of vomiting in 24 hours. Has had 4 episodes of diarrhea. Denies chest pain, shortness of breath. Given zofran by EMS.   Review of Systems  Positive:  Negative:   Physical Exam  BP (!) 200/82   Pulse 64   Temp 99.1 F (37.3 C) (Oral)   Resp 18   Ht '5\' 3"'$  (1.6 m)   Wt 72.2 kg   SpO2 95%   BMI 28.20 kg/m  Gen:   Awake, no distress   Resp:  Normal effort  MSK:   Moves extremities without difficulty  Other:  Epigastric abdominal TTP.   Medical Decision Making  Medically screening exam initiated at 2:08 PM.  Appropriate orders placed.  ANDRIA HEAD was informed that the remainder of the evaluation will be completed by another provider, this initial triage assessment does not replace that evaluation, and the importance of remaining in the ED until their evaluation is complete.  Blood pressure in the ED at 171/72 by me in triage.    Aren Pryde A, PA-C 02/03/22 1410   3:11 PM - Notified by RN of elevated troponin of 163. Informed RN to obtain room for patient at this time.    Margarete Horace A, PA-C 02/03/22 1511

## 2022-02-03 NOTE — ED Notes (Signed)
Date and time results received: 02/03/22 3:11 PM  (use smartphrase ".now" to insert current time)  Test: Trop Critical Value: 163  Name of Provider Notified: [place], [place]  Orders Received? Or Actions Taken?: see chart

## 2022-02-03 NOTE — ED Provider Notes (Signed)
[facility]PART[facility] Provider Note   CSN: [id number] Arrival date & time: 02/03/22  1257     History Afib, Anxiety, IBS, HLD, DM Chief Complaint  Patient presents with   Emesis   Diarrhea    [name] is a 89 y.o. female.  89 year old female with a past medical history of A-fib currently on Eliquis presents to the ED with a chief complaint of sudden onset of nausea, vomiting, diarrhea which began this morning.  Patient reports she was sitting at her home when suddenly she began to have excruciating pain to her generalized lower abdomen, had a couple episodes of nonbilious, nonbloody emesis.  Also had 3 episodes of nonbloody diarrhea.  She did receive Zofran by EMS, does report improvement in her vomiting.  She did not feel any pain in her chest when this episode occurred however feels like she is overall very weak.  She has had some burning with urination however reports the color of her urine has been the same.  She did receive an influenza vaccine on December 22 but has had no side effects since.  No recent antibiotic use, no sick contacts, no fevers, no shortness of breath.  The history is provided by the patient and medical records.  Emesis Severity:  Severe Duration:  24 hours Timing:  Constant Progression:  Improving Associated symptoms: abdominal pain and diarrhea   Associated symptoms: no chills, no fever, no headaches and no sore throat   Diarrhea Associated symptoms: abdominal pain and vomiting   Associated symptoms: no chills, no fever and no headaches        Home Medications Prior to Admission medications   Medication Sig Start Date End Date Taking? Authorizing Provider  acetaminophen (TYLENOL) 500 MG tablet Take 500 mg by mouth every 6 (six) hours as needed for mild pain, moderate pain, fever or headache.    [provider]  ALPRAZolam Duanne Moron) 0.5 MG tablet Take 0.5-1 mg by mouth 2 (two) times daily as needed for anxiety.     [provider]  apixaban (ELIQUIS) 5 MG TABS tablet Take 1 tablet (5 mg total) by mouth 2 (two) times daily. 01/03/22   Danford, Suann Larry, MD  carvedilol (COREG) 6.25 MG tablet Take 1 tablet (6.25 mg total) by mouth 2 (two) times daily with a meal. 01/03/22   Danford, Suann Larry, MD  Cholecalciferol (VITAMIN D-3) 1000 UNITS CAPS Take 3 capsules by mouth in the morning.    [provider]  ezetimibe (ZETIA) 10 MG tablet Take 1 tablet (10 mg total) by mouth daily. 01/04/22   Danford, Suann Larry, MD  furosemide (LASIX) 20 MG tablet TAKE ONE TAB EVERY MON, WED, FRI. TAKE ONE ADDITIONAL TAB IF WEIGHT GAIN OF 3+ LBS OVERNIGHT 11/02/21   Sueanne Margarita, MD  hydrALAZINE (APRESOLINE) 10 MG tablet Take 1 tablet (10 mg total) by mouth 3 (three) times daily. 12/29/21   Sueanne Margarita, MD  hydrocortisone (ANUSOL-HC) 2.5 % rectal cream Apply to perianal area 3 times daily. Patient taking differently: Place rectally as needed for hemorrhoids or anal itching. Apply to perianal area 3 times daily. 11/14/17   Zehr, Janett Billow D, PA-C  insulin glargine (LANTUS) 100 UNIT/ML Solostar Pen Inject 20 Units into the skin daily with breakfast.    [provider]  Insulin Pen Needle (BD PEN NEEDLE NANO 2ND GEN) 32G X 4 MM MISC See admin instructions.    [provider]  Multiple Vitamin (MULTIVITAMIN) tablet Take  1 tablet by mouth daily.      [provider]  pantoprazole (PROTONIX) 40 MG tablet TAKE 1 TABLET BY MOUTH EVERY DAY BEFORE BREAKFAST 02/03/20   Gatha Mayer, MD  polyethylene glycol (MIRALAX / GLYCOLAX) 17 g packet Take 17 g by mouth daily as needed for mild constipation. 07/02/21   Raiford Noble Latif, DO  spironolactone (ALDACTONE) 25 MG tablet TAKE 1/2 TABLET BY MOUTH EVERY DAY 12/29/21   Sueanne Margarita, MD  trandolapril (MAVIK) 4 MG tablet TAKE 1 TABLET BY MOUTH 2 TIMES DAILY. 08/16/21   Sueanne Margarita, MD      Allergies    Actos [pioglitazone], Avandia  [rosiglitazone], Fosamax [alendronate sodium], Morphine, Ultram [tramadol], Glimepiride, Januvia [sitagliptin], Lipitor [atorvastatin], Metformin and related, Prandin [repaglinide], Tradjenta [linagliptin], Zoloft [sertraline hcl], Aspirin, Bentyl [dicyclomine hcl], Ciprofloxacin, Dicyclomine, Hctz [hydrochlorothiazide], Keflex [cephalexin], Morphine and related, Norvasc [amlodipine besylate], Oxycodone, Sulfa antibiotics, Sulfamethoxazole-trimethoprim, and Penicillins    Review of Systems   Review of Systems  Constitutional:  Negative for chills and fever.  HENT:  Negative for sore throat.   Respiratory:  Negative for shortness of breath.   Cardiovascular:  Negative for chest pain.  Gastrointestinal:  Positive for abdominal pain, diarrhea, nausea and vomiting.  Genitourinary:  Negative for flank pain.  Musculoskeletal:  Negative for back pain.  Skin:  Negative for pallor and wound.  Neurological:  Negative for light-headedness and headaches.  All other systems reviewed and are negative.   Physical Exam Updated Vital Signs BP (!) 175/74   Pulse 75   Temp (!) 97.5 F (36.4 C) (Oral)   Resp (!) 25   Ht '5\' 3"'$  (1.6 m)   Wt 72.2 kg   SpO2 100%   BMI 28.20 kg/m  Physical Exam Vitals and nursing note reviewed.  Constitutional:      Appearance: She is ill-appearing.  HENT:     Head: Normocephalic.     Mouth/Throat:     Mouth: Mucous membranes are dry.  Eyes:     Pupils: Pupils are equal, round, and reactive to light.  Cardiovascular:     Rate and Rhythm: Normal rate.  Pulmonary:     Effort: Pulmonary effort is normal.     Breath sounds: No wheezing or rales.  Abdominal:     General: Abdomen is flat. Bowel sounds are normal.     Palpations: Abdomen is soft.     Tenderness: There is abdominal tenderness. There is no right CVA tenderness or left CVA tenderness.  Musculoskeletal:     Cervical back: Normal range of motion and neck supple.  Skin:    General: Skin is warm and dry.   Neurological:     Mental Status: She is alert and oriented to person, place, and time.     ED Results / Procedures / Treatments   Labs (all labs ordered are listed, but only abnormal results are displayed) Labs Reviewed  CBC WITH DIFFERENTIAL/PLATELET - Abnormal; Notable for the following components:      Result Value   Hemoglobin 11.7 (*)    MCV 75.5 (*)    MCH 24.5 (*)    RDW 16.2 (*)    All other components within normal limits  COMPREHENSIVE METABOLIC PANEL - Abnormal; Notable for the following components:   Glucose, Bld 130 (*)    Calcium 8.8 (*)    Total Protein 6.4 (*)    AST 49 (*)    ALT 51 (*)    All other components within  normal limits  CBG MONITORING, ED - Abnormal; Notable for the following components:   Glucose-Capillary 120 (*)    All other components within normal limits  CBG MONITORING, ED - Abnormal; Notable for the following components:   Glucose-Capillary 102 (*)    All other components within normal limits  TROPONIN I (HIGH SENSITIVITY) - Abnormal; Notable for the following components:   Troponin I (High Sensitivity) 163 (*)    All other components within normal limits  TROPONIN I (HIGH SENSITIVITY) - Abnormal; Notable for the following components:   Troponin I (High Sensitivity) 147 (*)    All other components within normal limits  URINE CULTURE  RESP PANEL BY RT-PCR (RSV, FLU A&B, COVID)  RVPGX2  LIPASE, BLOOD  URINALYSIS, ROUTINE W REFLEX MICROSCOPIC    EKG EKG Interpretation  Date/Time:  Thursday February 03 2022 14:31:17 EST Ventricular Rate:  65 PR Interval:  152 QRS Duration: 80 QT Interval:  390 QTC Calculation: 405 R Axis:   78 Text Interpretation: Sinus rhythm with marked sinus arrhythmia Nonspecific ST and T wave abnormality Abnormal ECG When compared with ECG of 02-Jan-2022 02:47, PREVIOUS ECG IS PRESENT No significant change since last tracing Confirmed by Isla Pence [phone]) on 02/03/2022 3:53:32 PM  Radiology DG Chest  Portable 1 View  Result Date: 02/03/2022 CLINICAL DATA:  Shortness of breath EXAM: PORTABLE CHEST 1 VIEW COMPARISON:  01/01/2022 FINDINGS: Cardiomegaly. Central vascular congestion. Possible mild interstitial edema. No pleural effusion or focal airspace disease. Aortic atherosclerosis. IMPRESSION: Cardiomegaly with central vascular congestion and possible mild interstitial edema. Electronically Signed   By: Donavan Foil M.D.   On: 02/03/2022 21:02   CT ABDOMEN PELVIS W CONTRAST  Result Date: 02/03/2022 CLINICAL DATA:  Left lower quadrant abdominal pain. EXAM: CT ABDOMEN AND PELVIS WITH CONTRAST TECHNIQUE: Multidetector CT imaging of the abdomen and pelvis was performed using the standard protocol following bolus administration of intravenous contrast. RADIATION DOSE REDUCTION: This exam was performed according to the departmental dose-optimization program which includes automated exposure control, adjustment of the mA and/or kV according to patient size and/or use of iterative reconstruction technique. CONTRAST:  71m OMNIPAQUE IOHEXOL 350 MG/ML SOLN COMPARISON:  Multiple prior CT examinations including 01/02/2022 and older FINDINGS: Lower chest: Mild linear opacity lung bases likely scar or atelectasis. No pleural effusion. The heart is enlarged. Hepatobiliary: Diffuse fatty liver infiltration. No enhancing liver lesion. Patent portal vein. The gallbladder is nondilated. Pancreas: Severe fatty atrophy of the pancreas. Spleen: Spleen is nonenlarged. Adrenals/Urinary Tract: Slight thickening of both adrenal glands is noted, nonspecific and not significantly changed from the [facility]. No enhancing renal mass. No collecting system dilatation. Preserved contour to the urinary bladder. Stomach/Bowel: On this non oral contrast exam, large bowel is of normal course and caliber. Left-sided colonic diverticula. Scattered stool. Stomach is nondilated. Small bowel is nondilated. No free air or  free fluid. Vascular/Lymphatic: Normal caliber aorta and IVC. Minimal atherosclerotic calcifications along the aorta and branch vessels. No developing abnormal lymph node enlargement present in the abdomen and pelvis. Reproductive: Prostate is unremarkable. Other: No abdominal wall hernia or abnormality. No abdominopelvic ascites. Musculoskeletal: Scattered degenerative changes of the spine. There is trace anterolisthesis of L5 on S1. Multilevel disc bulging in the lumbar spine region with some canal narrowing. IMPRESSION: No bowel obstruction, free air or free fluid. Few colonic diverticula. Fatty liver infiltration. Electronically Signed   By: AJill SideM.D.   On: 02/03/2022 17:46    Procedures Procedures  Medications Ordered in ED Medications  sodium chloride 0.9 % bolus 500 mL (0 mLs Intravenous Stopped 02/03/22 1709)  iohexol (OMNIPAQUE) 350 MG/ML injection 75 mL (75 mLs Intravenous Contrast Given 02/03/22 1736)  ondansetron (ZOFRAN) injection 4 mg (4 mg Intravenous Given 02/03/22 1834)  hydrALAZINE (APRESOLINE) injection 10 mg (10 mg Intravenous Given 02/03/22 1836)  acetaminophen (TYLENOL) tablet 1,000 mg (1,000 mg Oral Given 02/03/22 1845)  sodium chloride 0.9 % bolus 500 mL (0 mLs Intravenous Stopped 02/03/22 2117)  furosemide (LASIX) injection 20 mg (20 mg Intravenous Given 02/03/22 2120)    ED Course/ Medical Decision Making/ A&P                           Medical Decision Making Amount and/or Complexity of Data Reviewed Labs: ordered. Radiology: ordered.  Risk OTC drugs. Prescription drug management.    This patient presents to the ED for concern of abdominal pain, nausea,vomiting, this involves a number of treatment options, and is a complaint that carries with it a high risk of complications and morbidity.  The differential diagnosis includes bowel obstruction, ileus, C. Diff versus viral illness.    Co morbidities: Discussed in HPI   Brief History:  Patient with  underlying history of A-fib here with nausea, vomiting, diarrhea which began suddenly this morning.  Reports she has multiple episodes of nonbloody emesis, along with multiple episodes of diarrhea with no blood in her stool.  No oral intake since her arrival to the emergency department.  Given Zofran with EMS with some improvement in her symptoms.  Underlying history of A-fib, currently anticoagulated on Eliquis.  EMR reviewed including pt PMHx, past surgical history and past visits to ER.   See HPI for more details  Lab Tests:  I ordered and independently interpreted labs.  The pertinent results include:    Labs notable for CBC with no leukocytosis, hemoglobin slightly decreased.  CMP with no electrolyte derangement, creatinine level is unremarkable.  LFTs are slightly elevated from her baseline.  Troponin x 2 have remained elevated but flat.  Imaging Studies:  CT Abdomen showed: IMPRESSION:  No bowel obstruction, free air or free fluid. Few colonic  diverticula.    Fatty liver infiltration.    Cardiac Monitoring:  The patient was maintained on a cardiac monitor.  I personally viewed and interpreted the cardiac monitored which showed an underlying rhythm of:NSR EKG non-ischemic   Medicines ordered:  I ordered medication including zofran,bolus  for symptomatic treatment Reevaluation of the patient after these medicines showed that the patient stayed the same I have reviewed the patients home medicines and have made adjustments as needed   Critical Interventions:  Patient placed on supplemental oxygen 2L via [place] due to stats in the 89%. Chest xray was ordered.  Given hydralazine and tylenol to help with BP control and headache.   Reevaluation:  After the interventions noted above I re-evaluated patient and found that they have :worsened   Social Determinants of Health:  The patient's social determinants of health were a factor in the care of this patient   Problem List  / ED Course:  Patient here with nausea, vomiting, diarrhea x today.  Episodes were of sudden onset, not any recent antibiotic use, travel, sick contacts.  Does endorse some discomfort however no shortness of breath or cough, no URI symptoms.  Her labs are at baseline, no AKI, no signs of electrolyte derangement to account for her weakness, will further evaluate  origin with chest x-ray, urinalysis.  Patient's blood pressure was consistently elevated in the ED, she has not had any oral intake and no medications for blood pressure control on today's visit, therefore given hydralazine 10 mg to help with BP. Patient does report having a small MI in the month of November, was hospitalized.  Recent ECHO did show Left ventricular ejection fraction, by estimation, is 55 to 60%, provided with 566m bolus.  Considered cardiac etiology with an elevated troponin, however patient does have an elevation in troponin, repeat has remained flat and actually trending down at this time.  She does report some chest discomfort while sitting in her ED room. EKG is NSR, despite underlying Afib.  Chest x-ray did show some vascular congestion, fluids have been stopped.  After evaluation of x-ray a small course of diuretics was given to patient to help with diuresis.  Patient is now admitted to hospitalist service by Dr. RMarlowe Sax appreciate her assistance.  Patient is hemodynamically stable for admission.  Dispostion:  After consideration of the diagnostic results and the patients response to treatment, I feel that the patent would benefit from admission for intractable nausea and hypoxia.    Portions of this note were generated with DLobbyist Dictation errors may occur despite best attempts at proofreading.   Final Clinical Impression(s) / ED Diagnoses Final diagnoses:  Intractable nausea and vomiting  Hypoxia  Generalized weakness    Rx / DC Orders ED Discharge Orders     None         SCorinna Capra01/04/24 2130    HIsla Pence MD 02/03/22 2315

## 2022-02-03 NOTE — ED Notes (Signed)
I notified PA Blue of pt's elevated bp. She stated she will re-check when pt get to room.

## 2022-02-03 NOTE — H&P (Signed)
History and Physical    [name] CHE:[id number] DOB: [birth year] DOA: 02/03/2022  PCP: Antony Contras, MD  Patient coming from: Home  Chief Complaint: Nausea, vomiting, diarrhea  HPI: [name] is a 89 y.o. female with medical history significant of hypertension, hyperlipidemia, insulin-dependent diabetes, AAA, PVD, TIA, cerebral aneurysm status post coiling, GERD, duodenitis, IBS, anxiety.  Admitted last month in early December for new onset A-fib with RVR and was started on Coreg and Eliquis.  Patient presents to the ED today complaining of nausea, vomiting, and diarrhea.  In the ED, patient noted to be hypertensive with systolic as high as 536.  Oxygen saturation 89% on room air and placed on 2 L.  No fever or tachycardia.  Labs significant for no leukocytosis, hemoglobin 11.7 (stable), AST 49, ALT 51, alk phos and T. bili normal, lipase normal, UA pending, high-sensitivity troponin 163> 147, COVID and influenza PCR pending.  CT abdomen pelvis negative for acute finding.  Showing few colonic diverticula and fatty infiltration of the liver.  Chest x-ray showing cardiomegaly with central vascular congestion and possible mild interstitial edema. Patient was given Tylenol, IV hydralazine 10 mg, Zofran, 1 L IV fluid, and IV Lasix 20 mg.  TRH called to admit.   Patient is reporting 1 day history of nausea, vomiting, diarrhea, and abdominal pain.  She reports 1 episode of vomiting at home and continues to feel extremely nauseous.  Reporting at least 3 episodes of nonbloody diarrhea and bilateral lower quadrant abdominal pain.  She denies any urinary symptoms.  She lives with her husband who is not having similar symptoms.  Patient is also endorsing dyspnea on exertion, orthopnea, and bilateral lower extremity edema.  She is taking Lasix 20 mg 3 times a week.  She is on Eliquis for A-fib.  Patient states her cardiologist stopped Coreg a week ago as her heart rate was low.  Patient reports having  substernal chest pain when she initially came into the ED but denies any chest pain at present.  Review of Systems:  Review of Systems  All other systems reviewed and are negative.   Past Medical History:  Diagnosis Date   Abdominal pain, chronic, left lower quadrant    Acute torn meniscus of knee    Adenomatous colon polyp 1970   carcinoma in situ   Allergic rhinitis    Amaurosis fugax 08/07/2012   Anxiety    Ascending aortic aneurysm ([place]) 12/07/2015   91m by chest CTA 08/2020   Benign essential tremor syndrome    Bicuspid aortic valve    no AS on 07/2019   Carotid stenosis    1039 bilateral by dopplers 03/2017.    colon ca dx'd 1970   surg only   DDD (degenerative disc disease)    Diverticulitis    Diverticulosis    DM (diabetes mellitus) ([place]    Duodenitis    peptic, with gastric heterotopia   Endometriosis    s/p hysterectomy   Fundic gland polyposis of stomach    GERD (gastroesophageal reflux disease)    Heart murmur    Hiatal hernia 02/03/2005   History of cerebral aneurysm repair    s/p coiling   History of hemorrhoids    with bleeding   History of shingles    HLD (hyperlipidemia)    HTN (hypertension)    Hypercholesteremia    IBS (irritable bowel syndrome)    Iron deficiency    Lung nodule 07/12/2021   CT 05/2021: 3 mm right  solid pulmonary nodule-no routine follow-up imaging recommended   Migraine    MVP (mitral valve prolapse)    Osteopenia    Peripheral neuropathy    Toe fracture, right    second toe   UTI (lower urinary tract infection)    Varicose vein     Past Surgical History:  Procedure Laterality Date   ABDOMINAL HYSTERECTOMY     APPENDECTOMY     CARPAL TUNNEL RELEASE  08/26/07   CATARACT EXTRACTION     CEREBRAL ANEURYSM REPAIR     COLON RESECTION     COLONOSCOPY  06/07/08   divertiulosis, internal hemorrhoids   COLONOSCOPY W/ BIOPSIES AND POLYPECTOMY  02/03/05   diverticulosis, 4 mm sessile polyps, internal and external hemorrhoids    CORONARY ANGIOPLASTY WITH STENT PLACEMENT     ESOPHAGOGASTRODUODENOSCOPY  02/03/05   hiatal hernia, 6 benign gastric polyps   FLEXIBLE SIGMOIDOSCOPY     HAND SURGERY Right    INTRAOCULAR LENS INSERTION     right hand decompressive fasciotomy  08/26/07   , dorsal and volar   TONSILLECTOMY       reports that she has never smoked. She has never used smokeless tobacco. She reports that she does not drink alcohol and does not use drugs.  Allergies  Allergen Reactions   Actos [Pioglitazone]     Chronic  Pedal edema:contraindication   Avandia [Rosiglitazone]     Chronic pedal edema:contraindication   Fosamax [Alendronate Sodium]     unknown   Morphine Other (See Comments)   Ultram [Tramadol] Shortness Of Breath and Palpitations   Glimepiride     hypersensitivity   Januvia [Sitagliptin]     hypersensitivity   Lipitor [Atorvastatin]     Causes muscle pain and swelling   Metformin And Related     gastritis   Prandin [Repaglinide]     Abdominal pain: side effects   Tradjenta [Linagliptin]     GI problems, hypersensitivity   Zoloft [Sertraline Hcl]     Jittery or zombie like   Aspirin Other (See Comments)    Bleeding ulcers    Bentyl [Dicyclomine Hcl]     Came close to passing out. weak   Ciprofloxacin Nausea And Vomiting    Severe stomach upset   Dicyclomine     Other reaction(s): Unknown   Hctz [Hydrochlorothiazide] Other (See Comments)    URINARY ISSUES   Keflex [Cephalexin]    Morphine And Related Other (See Comments)    Body Spasms   Norvasc [Amlodipine Besylate]     Weak and dizzy.    Oxycodone    Sulfa Antibiotics Hives   Sulfamethoxazole-Trimethoprim     Other reaction(s): hives   Penicillins Swelling and Rash    Amoxicillin causes stomach upset.    Family History  Problem Relation Age of Onset   Ovarian cancer Mother    Heart disease Father    Kidney disease Father    Diabetes Paternal Grandmother    Diabetes Brother    Hypertension Brother    Heart  disease Brother    Diabetes Brother    Heart disease Brother    Microcephaly Brother    Diabetes Other    Colon cancer Other     Prior to Admission medications   Medication Sig Start Date End Date Taking? Authorizing Provider  acetaminophen (TYLENOL) 500 MG tablet Take 500 mg by mouth every 6 (six) hours as needed for mild pain, moderate pain, fever or headache.    [provider]  ALPRAZolam (  XANAX) 0.5 MG tablet Take 0.5-1 mg by mouth 2 (two) times daily as needed for anxiety.    [provider]  apixaban (ELIQUIS) 5 MG TABS tablet Take 1 tablet (5 mg total) by mouth 2 (two) times daily. 01/03/22   Danford, Suann Larry, MD  carvedilol (COREG) 6.25 MG tablet Take 1 tablet (6.25 mg total) by mouth 2 (two) times daily with a meal. 01/03/22   Danford, Suann Larry, MD  Cholecalciferol (VITAMIN D-3) 1000 UNITS CAPS Take 3 capsules by mouth in the morning.    [provider]  ezetimibe (ZETIA) 10 MG tablet Take 1 tablet (10 mg total) by mouth daily. 01/04/22   Danford, Suann Larry, MD  furosemide (LASIX) 20 MG tablet TAKE ONE TAB EVERY MON, WED, FRI. TAKE ONE ADDITIONAL TAB IF WEIGHT GAIN OF 3+ LBS OVERNIGHT 11/02/21   Sueanne Margarita, MD  hydrALAZINE (APRESOLINE) 10 MG tablet Take 1 tablet (10 mg total) by mouth 3 (three) times daily. 12/29/21   Sueanne Margarita, MD  hydrocortisone (ANUSOL-HC) 2.5 % rectal cream Apply to perianal area 3 times daily. Patient taking differently: Place rectally as needed for hemorrhoids or anal itching. Apply to perianal area 3 times daily. 11/14/17   Zehr, Janett Billow D, PA-C  insulin glargine (LANTUS) 100 UNIT/ML Solostar Pen Inject 20 Units into the skin daily with breakfast.    [provider]  Insulin Pen Needle (BD PEN NEEDLE NANO 2ND GEN) 32G X 4 MM MISC See admin instructions.    [provider]  Multiple Vitamin (MULTIVITAMIN) tablet Take 1 tablet by mouth daily.      [provider]  pantoprazole (PROTONIX)  40 MG tablet TAKE 1 TABLET BY MOUTH EVERY DAY BEFORE BREAKFAST 02/03/20   Gatha Mayer, MD  polyethylene glycol (MIRALAX / GLYCOLAX) 17 g packet Take 17 g by mouth daily as needed for mild constipation. 07/02/21   Raiford Noble Latif, DO  spironolactone (ALDACTONE) 25 MG tablet TAKE 1/2 TABLET BY MOUTH EVERY DAY 12/29/21   Sueanne Margarita, MD  trandolapril (MAVIK) 4 MG tablet TAKE 1 TABLET BY MOUTH 2 TIMES DAILY. 08/16/21   Sueanne Margarita, MD    Physical Exam: Vitals:   02/03/22 1900 02/03/22 1915 02/03/22 1930 02/03/22 2000  BP: (!) 177/75  (!) 171/84 (!) 175/74  Pulse: 79  82 75  Resp: (!) 26 20  (!) 25  Temp:      TempSrc:      SpO2: 97%  100% 100%  Weight:      Height:        Physical Exam Vitals reviewed.  Constitutional:      General: She is not in acute distress. HENT:     Head: Normocephalic and atraumatic.  Eyes:     Extraocular Movements: Extraocular movements intact.  Cardiovascular:     Rate and Rhythm: Normal rate and regular rhythm.     Pulses: Normal pulses.  Pulmonary:     Effort: Pulmonary effort is normal. No respiratory distress.     Breath sounds: Rales present. No wheezing.  Abdominal:     General: Bowel sounds are normal. There is no distension.     Palpations: Abdomen is soft.     Tenderness: There is abdominal tenderness. There is no guarding or rebound.     Comments: Bilateral lower quadrants tender to palpation  Musculoskeletal:     Cervical back: Normal range of motion.     Right lower leg: Edema present.  Left lower leg: Edema present.  Skin:    General: Skin is warm and dry.  Neurological:     General: No focal deficit present.     Mental Status: She is alert and oriented to person, place, and time.     Labs on Admission: I have personally reviewed following labs and imaging studies  CBC: Recent Labs  Lab 02/03/22 1420  WBC 6.5  NEUTROABS 3.6  HGB 11.7*  HCT 36.1  MCV 75.5*  PLT 650   Basic Metabolic Panel: Recent Labs   Lab 02/03/22 1420  NA 138  K 3.8  CL 102  CO2 25  GLUCOSE 130*  BUN 14  CREATININE 0.83  CALCIUM 8.8*   GFR: Estimated Creatinine Clearance: 45.5 mL/min (by C-G formula based on SCr of 0.83 mg/dL). Liver Function Tests: Recent Labs  Lab 02/03/22 1420  AST 49*  ALT 51*  ALKPHOS 56  BILITOT 0.7  PROT 6.4*  ALBUMIN 3.7   Recent Labs  Lab 02/03/22 1420  LIPASE 23   No results for input(s): "AMMONIA" in the last 168 hours. Coagulation Profile: No results for input(s): "INR", "PROTIME" in the last 168 hours. Cardiac Enzymes: No results for input(s): "CKTOTAL", "CKMB", "CKMBINDEX", "TROPONINI" in the last 168 hours. BNP (last 3 results) No results for input(s): "PROBNP" in the last 8760 hours. HbA1C: No results for input(s): "HGBA1C" in the last 72 hours. CBG: Recent Labs  Lab 02/03/22 1622 02/03/22 1854  GLUCAP 120* 102*   Lipid Profile: No results for input(s): "CHOL", "HDL", "LDLCALC", "TRIG", "CHOLHDL", "LDLDIRECT" in the last 72 hours. Thyroid Function Tests: No results for input(s): "TSH", "T4TOTAL", "FREET4", "T3FREE", "THYROIDAB" in the last 72 hours. Anemia Panel: No results for input(s): "VITAMINB12", "FOLATE", "FERRITIN", "TIBC", "IRON", "RETICCTPCT" in the last 72 hours. Urine analysis:    Component Value Date/Time   COLORURINE STRAW (A) 06/30/2021 2001   APPEARANCEUR CLEAR 06/30/2021 2001   LABSPEC 1.028 06/30/2021 2001   PHURINE 6.0 06/30/2021 2001   GLUCOSEU NEGATIVE 06/30/2021 2001   HGBUR NEGATIVE 06/30/2021 2001   [place] 06/30/2021 2001   [place] 06/30/2021 2001   PROTEINUR NEGATIVE 06/30/2021 2001   UROBILINOGEN 0.2 04/01/2014 1627   NITRITE NEGATIVE 06/30/2021 2001   LEUKOCYTESUR NEGATIVE 06/30/2021 2001    Radiological Exams on Admission: DG Chest Portable 1 View  Result Date: 02/03/2022 CLINICAL DATA:  Shortness of breath EXAM: PORTABLE CHEST 1 VIEW COMPARISON:  01/01/2022 FINDINGS: Cardiomegaly. Central  vascular congestion. Possible mild interstitial edema. No pleural effusion or focal airspace disease. Aortic atherosclerosis. IMPRESSION: Cardiomegaly with central vascular congestion and possible mild interstitial edema. Electronically Signed   By: Donavan Foil M.D.   On: 02/03/2022 21:02   CT ABDOMEN PELVIS W CONTRAST  Result Date: 02/03/2022 CLINICAL DATA:  Left lower quadrant abdominal pain. EXAM: CT ABDOMEN AND PELVIS WITH CONTRAST TECHNIQUE: Multidetector CT imaging of the abdomen and pelvis was performed using the standard protocol following bolus administration of intravenous contrast. RADIATION DOSE REDUCTION: This exam was performed according to the departmental dose-optimization program which includes automated exposure control, adjustment of the mA and/or kV according to patient size and/or use of iterative reconstruction technique. CONTRAST:  7m OMNIPAQUE IOHEXOL 350 MG/ML SOLN COMPARISON:  Multiple prior CT examinations including 01/02/2022 and older FINDINGS: Lower chest: Mild linear opacity lung bases likely scar or atelectasis. No pleural effusion. The heart is enlarged. Hepatobiliary: Diffuse fatty liver infiltration. No enhancing liver lesion. Patent portal vein. The gallbladder is nondilated. Pancreas: Severe fatty atrophy  of the pancreas. Spleen: Spleen is nonenlarged. Adrenals/Urinary Tract: Slight thickening of both adrenal glands is noted, nonspecific and not significantly changed from the recent prior [facility] for technique. No enhancing renal mass. No collecting system dilatation. Preserved contour to the urinary bladder. Stomach/Bowel: On this non oral contrast exam, large bowel is of normal course and caliber. Left-sided colonic diverticula. Scattered stool. Stomach is nondilated. Small bowel is nondilated. No free air or free fluid. Vascular/Lymphatic: Normal caliber aorta and IVC. Minimal atherosclerotic calcifications along the aorta and branch vessels. No developing abnormal  lymph node enlargement present in the abdomen and pelvis. Reproductive: Prostate is unremarkable. Other: No abdominal wall hernia or abnormality. No abdominopelvic ascites. Musculoskeletal: Scattered degenerative changes of the spine. There is trace anterolisthesis of L5 on S1. Multilevel disc bulging in the lumbar spine region with some canal narrowing. IMPRESSION: No bowel obstruction, free air or free fluid. Few colonic diverticula. Fatty liver infiltration. Electronically Signed   By: Jill Side M.D.   On: 02/03/2022 17:46    EKG: Independently reviewed. Sinus rhythm, no significant change since prior tracing.  Assessment and Plan  Nausea and vomiting -No fever or leukocytosis -Transaminases mildly elevated.  Alk phos and T. bili normal.  Lipase normal. -CT showing fatty infiltration of the liver but no acute hepatobiliary abnormality. -COVID and influenza PCR negative -UA with negative nitrite, trace leukocytes, and microscopy showing 0-5 WBCs and no bacteria.  Patient is not endorsing urinary symptoms. -Continue antiemetic as needed -Patient received 1 L IV fluids in the ED.  Will avoid additional IV fluids given concern for volume overload.  Diarrhea -No fever or leukocytosis -CT without evidence of colitis. -COVID and influenza PCR negative -C. difficile PCR and GI pathogen panel, enteric precautions  Chest pain and elevated troponin -Troponin elevated but stable (163> 147) and not consistent with ACS.  Troponin elevation likely due to demand ischemia in setting of decompensated CHF.  Patient is currently chest pain-free. -PE less likely as patient is on Eliquis and reports compliance.  She is not tachycardic.  Will check D-dimer.  Acute hypoxemic respiratory failure secondary to acute on chronic HFpEF -Oxygen saturation 89% on room air, currently satting well on 2.5 L.   -Patient is endorsing dyspnea on exertion and orthopnea.  She has bilateral lower extremity edema.  Taking  Lasix 20 mg 3 times a week. -Chest x-ray showing cardiomegaly with central vascular congestion and possible mild interstitial edema. -Echo done a month ago showing EF 55 to 29%, grade 1 diastolic dysfunction. -Patient was given IV Lasix 20 mg in the ED.  Check BNP. -Monitor intake and output -Daily weights -Continue supplemental oxygen, wean as tolerated  Hypertensive urgency -Systolic as high as 574, now improved to 180s after IV hydralazine. -Per history provided by patient, Coreg was stopped a week ago by cardiology due to concern for bradycardia. -Continue home meds after pharmacy med rec is done except continue to hold Coreg and monitor closely. -IV Lasix given for volume overload -IV hydralazine as needed  Hyperlipidemia -Pharmacy med rec pending.  Insulin-dependent diabetes -A1c 6.2 on 01/02/2022 -Sensitive sliding scale insulin ACHS -Continue home long-acting insulin after pharmacy med rec is done  Paroxysmal A-fib -Per patient, Coreg was stopped a week ago by cardiology due to bradycardia.  Currently in sinus rhythm with heart rate in the 70s. -Continue Eliquis after pharmacy med rec is done  DVT prophylaxis: Continue Eliquis after pharmacy med rec is done. Code Status: DNR/DNI (discussed with the patient) Family  Communication: No family available at this time. Level of care: Telemetry bed Admission status: It is my clinical opinion that admission to INPATIENT is reasonable and necessary because of the expectation that this patient will require hospital care that crosses at least 2 midnights to treat this condition based on the medical complexity of the problems presented.  Given the aforementioned information, the predictability of an adverse outcome is felt to be significant.   Shela Leff MD [facility]  If 7PM-7AM, please contact night-coverage [email or web address]  02/03/2022, 9:15 PM

## 2022-02-03 NOTE — ED Notes (Signed)
Attempted to straight cath patient, patient has very difficult anatomy and no urine was able to be obtained. Patient placed back on purewick and asked to keep trying to give urine.

## 2022-02-03 NOTE — ED Notes (Signed)
Patient reports she is still nauseous and doesn't want anything to eat or drink yet.

## 2022-02-04 DIAGNOSIS — I4891 Unspecified atrial fibrillation: Secondary | ICD-10-CM | POA: Diagnosis present

## 2022-02-04 DIAGNOSIS — R112 Nausea with vomiting, unspecified: Secondary | ICD-10-CM | POA: Diagnosis not present

## 2022-02-04 LAB — BASIC METABOLIC PANEL
Anion gap: 14 (ref 5–15)
BUN: 12 mg/dL (ref 8–23)
CO2: 24 mmol/L (ref 22–32)
Calcium: 8.6 mg/dL — ABNORMAL LOW (ref 8.9–10.3)
Chloride: 104 mmol/L (ref 98–111)
Creatinine, Ser: 0.83 mg/dL (ref 0.44–1.00)
GFR, Estimated: >60 mL/min (ref >60)
Glucose, Bld: 93 mg/dL (ref 70–99)
Potassium: 3.3 mmol/L — ABNORMAL LOW (ref 3.5–5.1)
Sodium: 142 mmol/L (ref 135–145)

## 2022-02-04 LAB — CBG MONITORING, ED
Glucose-Capillary: 115 mg/dL — ABNORMAL HIGH (ref 70–99)
Glucose-Capillary: 154 mg/dL — ABNORMAL HIGH (ref 70–99)

## 2022-02-04 LAB — GLUCOSE, CAPILLARY
Glucose-Capillary: 135 mg/dL — ABNORMAL HIGH (ref 70–99)
Glucose-Capillary: 141 mg/dL — ABNORMAL HIGH (ref 70–99)

## 2022-02-04 LAB — URINE CULTURE

## 2022-02-04 MED ORDER — DILTIAZEM HCL 25 MG/5ML IV SOLN
INTRAVENOUS | Status: AC
  Administered 2022-02-04: 10 mg
  Filled 2022-02-04: qty 5

## 2022-02-04 MED ORDER — POTASSIUM CHLORIDE 20 MEQ PO PACK
40.0000 meq | PACK | Freq: Two times a day (BID) | ORAL | Status: AC
  Administered 2022-02-04 (×2): 40 meq via ORAL
  Filled 2022-02-04: qty 2

## 2022-02-04 MED ORDER — FUROSEMIDE 10 MG/ML IJ SOLN
40.0000 mg | Freq: Once | INTRAMUSCULAR | Status: AC
  Administered 2022-02-04: 40 mg via INTRAVENOUS
  Filled 2022-02-04: qty 4

## 2022-02-04 MED ORDER — CARVEDILOL 6.25 MG PO TABS
6.2500 mg | ORAL_TABLET | Freq: Two times a day (BID) | ORAL | Status: DC
  Administered 2022-02-04 (×2): 6.25 mg via ORAL
  Filled 2022-02-04: qty 1
  Filled 2022-02-04: qty 2

## 2022-02-04 MED ORDER — POTASSIUM CHLORIDE 20 MEQ PO PACK
40.0000 meq | PACK | Freq: Once | ORAL | Status: DC
  Filled 2022-02-04: qty 2

## 2022-02-04 MED ORDER — DILTIAZEM LOAD VIA INFUSION
10.0000 mg | Freq: Once | INTRAVENOUS | Status: DC
  Filled 2022-02-04: qty 10

## 2022-02-04 MED ORDER — DILTIAZEM HCL-DEXTROSE 125-5 MG/125ML-% IV SOLN (PREMIX)
5.0000 mg/h | INTRAVENOUS | Status: DC
  Administered 2022-02-04 – 2022-02-05 (×2): 5 mg/h via INTRAVENOUS
  Filled 2022-02-04 (×2): qty 125

## 2022-02-04 MED ORDER — APIXABAN 5 MG PO TABS
5.0000 mg | ORAL_TABLET | Freq: Two times a day (BID) | ORAL | Status: DC
  Administered 2022-02-04 – 2022-02-18 (×29): 5 mg via ORAL
  Filled 2022-02-04 (×29): qty 1

## 2022-02-04 NOTE — ED Notes (Signed)
Pt meal tray at bedside at this time. Pt stated she do not feel like eating right now because she feels nauseous. Informed pt I can warm her tray up whenever she feels like eating just ring the call bell. Pt call bed in reach, nurse notified of pt complaint.

## 2022-02-04 NOTE — Progress Notes (Addendum)
PROGRESS NOTE    [name]  YBW:[id number] DOB: Sep 28, 1932 DOA: 02/03/2022 PCP: Antony Contras, MD  89/F with history of DM2, hypertension, AAA, PVD, TIA, cerebral aneurysm status post coiling, GERD, duodenitis, IBS, anxiety.  Admitted last month in early December for new onset A-fib with RVR and was started on Coreg and Eliquis.  -Presented to the ED 1/4 with nausea vomiting and diarrhea -In the ED she was noted to be hypertensive, blood pressure 220, mildly hypoxic requiring 2 L O2.  Labs were unremarkable except for mildly elevated high-sensitivity troponin, flu and COVID PCR were negative, CT abdomen pelvis negative for acute findings, chest x-ray noted pulmonary vascular congestion and possible mild interstitial edema   Subjective: -No further diarrhea today, nauseated, unable to eat anything, heart rate racing  Assessment and Plan   Nausea/vomiting/diarrhea -Suspect viral gastroenteritis, flu and COVID PCR negative, CT abdomen pelvis without acute findings -Supportive care, GI pathogen panel pending -Diet as tolerated  Atrial fibrillation with RVR -Recent echo with preserved EF, resume Coreg, start Cardizem gtt. this morning -Resume Eliquis -May need cardioversion depending on clinical course  Acute on chronic diastolic CHF Mild hypoxia -Likely triggered by A-fib, has been taking Lasix 3 times a week, chest x-ray with pulmonary vascular congestion and mild edema -Received a dose of Lasix in the ED, repeat dose now -Last echo 12/23 with EF 55-60%, grade 1 DD  Chest pain and elevated troponin -Likely secondary to demand in the setting of CHF, A-fib   Hypertensive urgency -Systolic as high as 768, now improved  -Restarting Coreg, hydralazine as needed   Insulin-dependent diabetes -A1c 6.2 on 01/02/2022 -Sensitive sliding scale insulin ACHS -Hold Lantus today, on 20 units at baseline  DVT prophylaxis: Eliquis Code Status: DNR Family Communication: None Disposition  Plan: Home in 2 to 3 days  Consultants:    Procedures:   Antimicrobials:    Objective: Vitals:   02/04/22 1000 02/04/22 1015 02/04/22 1030 02/04/22 1045  BP: (!) 144/65 (!) 149/78 (!) 156/72 (!) 156/70  Pulse: 73 95 (!) 127 (!) 118  Resp: (!) 28 (!) 30 (!) 7 (!) 27  Temp:      TempSrc:      SpO2: 99% 97% 98% 96%  Weight:      Height:        Intake/Output Summary (Last 24 hours) at 02/04/2022 1151 Last data filed at 02/03/2022 2117 Gross per 24 hour  Intake 500 ml  Output --  Net 500 ml   Filed Weights   02/03/22 1301  Weight: 72.2 kg    Examination:  General exam: Elderly frail chronically ill female laying in bed, AAOx3 HEENT: Positive JVD CVS: S1-S2, irregularly irregular rhythm, tachycardic Lungs: Decreased at the bases, rare Rales Abdomen: Soft, nontender, bowel sounds present Extremities: 1+ edema Skin: No rashes Psychiatry:  Mood & affect appropriate.     Data Reviewed:   CBC: Recent Labs  Lab 02/03/22 1420  WBC 6.5  NEUTROABS 3.6  HGB 11.7*  HCT 36.1  MCV 75.5*  PLT 115   Basic Metabolic Panel: Recent Labs  Lab 02/03/22 1420 02/04/22 0459  NA 138 142  K 3.8 3.3*  CL 102 104  CO2 25 24  GLUCOSE 130* 93  BUN 14 12  CREATININE 0.83 0.83  CALCIUM 8.8* 8.6*   GFR: Estimated Creatinine Clearance: 45.5 mL/min (by C-G formula based on SCr of 0.83 mg/dL). Liver Function Tests: Recent Labs  Lab 02/03/22 1420  AST 49*  ALT 51*  ALKPHOS 56  BILITOT 0.7  PROT 6.4*  ALBUMIN 3.7   Recent Labs  Lab 02/03/22 1420  LIPASE 23   No results for input(s): "AMMONIA" in the last 168 hours. Coagulation Profile: No results for input(s): "INR", "PROTIME" in the last 168 hours. Cardiac Enzymes: No results for input(s): "CKTOTAL", "CKMB", "CKMBINDEX", "TROPONINI" in the last 168 hours. BNP (last 3 results) No results for input(s): "PROBNP" in the last 8760 hours. HbA1C: No results for input(s): "HGBA1C" in the last 72 hours. CBG: Recent  Labs  Lab 02/03/22 1622 02/03/22 1854 02/03/22 2252 02/04/22 0808  GLUCAP 120* 102* 112* 115*   Lipid Profile: No results for input(s): "CHOL", "HDL", "LDLCALC", "TRIG", "CHOLHDL", "LDLDIRECT" in the last 72 hours. Thyroid Function Tests: No results for input(s): "TSH", "T4TOTAL", "FREET4", "T3FREE", "THYROIDAB" in the last 72 hours. Anemia Panel: No results for input(s): "VITAMINB12", "FOLATE", "FERRITIN", "TIBC", "IRON", "RETICCTPCT" in the last 72 hours. Urine analysis:    Component Value Date/Time   COLORURINE YELLOW 02/03/2022 2122   APPEARANCEUR CLEAR 02/03/2022 2122   LABSPEC 1.035 (H) 02/03/2022 2122   PHURINE 5.0 02/03/2022 2122   GLUCOSEU NEGATIVE 02/03/2022 2122   HGBUR MODERATE (A) 02/03/2022 2122   BILIRUBINUR NEGATIVE 02/03/2022 2122   KETONESUR 20 (A) 02/03/2022 2122   PROTEINUR 100 (A) 02/03/2022 2122   UROBILINOGEN 0.2 04/01/2014 1627   NITRITE NEGATIVE 02/03/2022 2122   LEUKOCYTESUR TRACE (A) 02/03/2022 2122   Sepsis Labs: '@LABRCNTIP'$ (procalcitonin:4,lacticidven:4)  ) Recent Results (from the past 240 hour(s))  Resp panel by RT-PCR (RSV, Flu A&B, Covid) Anterior Nasal Swab     Status: None   Collection Time: 02/03/22  8:05 PM   Specimen: Anterior Nasal Swab  Result Value Ref Range Status   SARS Coronavirus 2 by RT PCR NEGATIVE NEGATIVE Final    Comment: (NOTE) SARS-CoV-2 target nucleic acids are NOT DETECTED.  The SARS-CoV-2 RNA is generally detectable in upper respiratory specimens during the acute phase of infection. The lowest concentration of SARS-CoV-2 viral copies this assay can detect is 138 copies/mL. A negative result does not preclude SARS-Cov-2 infection and should not be used as the sole basis for treatment or other patient management decisions. A negative result may occur with  improper specimen collection/handling, submission of specimen other than nasopharyngeal swab, presence of viral mutation(s) within the areas targeted by this  assay, and inadequate number of viral copies(<138 copies/mL). A negative result must be combined with clinical observations, patient history, and epidemiological information. The expected result is Negative.  Fact Sheet for Patients:  EntrepreneurPulse.com.au  Fact Sheet for Healthcare Providers:  IncredibleEmployment.be  This test is no t yet approved or cleared by the [place] FDA and  has been authorized for detection and/or diagnosis of SARS-CoV-2 by FDA under an Emergency Use Authorization (EUA). This EUA will remain  in effect (meaning this test can be used) for the duration of the COVID-19 declaration under Section 564(b)(1) of the Act, 21 U.S.C.section 360bbb-3(b)(1), unless the authorization is terminated  or revoked sooner.       Influenza A by PCR NEGATIVE NEGATIVE Final   Influenza B by PCR NEGATIVE NEGATIVE Final    Comment: (NOTE) The Xpert Xpress SARS-CoV-2/FLU/RSV plus assay is intended as an aid in the diagnosis of influenza from Nasopharyngeal swab specimens and should not be used as a sole basis for treatment. Nasal washings and aspirates are unacceptable for Xpert Xpress SARS-CoV-2/FLU/RSV testing.  Fact Sheet for Patients: EntrepreneurPulse.com.au  Fact Sheet for Healthcare Providers: IncredibleEmployment.be  This test is not yet approved or cleared by the [place] and has been authorized for detection and/or diagnosis of SARS-CoV-2 by FDA under an Emergency Use Authorization (EUA). This EUA will remain in effect (meaning this test can be used) for the duration of the COVID-19 declaration under Section 564(b)(1) of the Act, 21 U.S.C. section 360bbb-3(b)(1), unless the authorization is terminated or revoked.     Resp Syncytial Virus by PCR NEGATIVE NEGATIVE Final    Comment: (NOTE) Fact Sheet for Patients: EntrepreneurPulse.com.au  Fact Sheet for  Healthcare Providers: IncredibleEmployment.be  This test is not yet approved or cleared by the [place] FDA and has been authorized for detection and/or diagnosis of SARS-CoV-2 by FDA under an Emergency Use Authorization (EUA). This EUA will remain in effect (meaning this test can be used) for the duration of the COVID-19 declaration under Section 564(b)(1) of the Act, 21 U.S.C. section 360bbb-3(b)(1), unless the authorization is terminated or revoked.  Performed at [facility], [place] [address], [place], [place] [address]      Radiology Studies: DG Chest Portable 1 View  Result Date: 02/03/2022 CLINICAL DATA:  Shortness of breath EXAM: PORTABLE CHEST 1 VIEW COMPARISON:  01/01/2022 FINDINGS: Cardiomegaly. Central vascular congestion. Possible mild interstitial edema. No pleural effusion or focal airspace disease. Aortic atherosclerosis. IMPRESSION: Cardiomegaly with central vascular congestion and possible mild interstitial edema. Electronically Signed   By: Donavan Foil M.D.   On: 02/03/2022 21:02   CT ABDOMEN PELVIS W CONTRAST  Result Date: 02/03/2022 CLINICAL DATA:  Left lower quadrant abdominal pain. EXAM: CT ABDOMEN AND PELVIS WITH CONTRAST TECHNIQUE: Multidetector CT imaging of the abdomen and pelvis was performed using the standard protocol following bolus administration of intravenous contrast. RADIATION DOSE REDUCTION: This exam was performed according to the departmental dose-optimization program which includes automated exposure control, adjustment of the mA and/or kV according to patient size and/or use of iterative reconstruction technique. CONTRAST:  58m OMNIPAQUE IOHEXOL 350 MG/ML SOLN COMPARISON:  Multiple prior CT examinations including 01/02/2022 and older FINDINGS: Lower chest: Mild linear opacity lung bases likely scar or atelectasis. No pleural effusion. The heart is enlarged. Hepatobiliary: Diffuse fatty liver infiltration. No enhancing liver  lesion. Patent portal vein. The gallbladder is nondilated. Pancreas: Severe fatty atrophy of the pancreas. Spleen: Spleen is nonenlarged. Adrenals/Urinary Tract: Slight thickening of both adrenal glands is noted, nonspecific and not significantly changed from the [facility]. No enhancing renal mass. No collecting system dilatation. Preserved contour to the urinary bladder. Stomach/Bowel: On this non oral contrast exam, large bowel is of normal course and caliber. Left-sided colonic diverticula. Scattered stool. Stomach is nondilated. Small bowel is nondilated. No free air or free fluid. Vascular/Lymphatic: Normal caliber aorta and IVC. Minimal atherosclerotic calcifications along the aorta and branch vessels. No developing abnormal lymph node enlargement present in the abdomen and pelvis. Reproductive: Prostate is unremarkable. Other: No abdominal wall hernia or abnormality. No abdominopelvic ascites. Musculoskeletal: Scattered degenerative changes of the spine. There is trace anterolisthesis of L5 on S1. Multilevel disc bulging in the lumbar spine region with some canal narrowing. IMPRESSION: No bowel obstruction, free air or free fluid. Few colonic diverticula. Fatty liver infiltration. Electronically Signed   By: AJill SideM.D.   On: 02/03/2022 17:46     Scheduled Meds:  apixaban  5 mg Oral BID   carvedilol  6.25 mg Oral BID WC   diltiazem  10 mg Intravenous Once   insulin aspart  0-5  Units Subcutaneous QHS   insulin aspart  0-9 Units Subcutaneous TID WC   potassium chloride  40 mEq Oral Once   Continuous Infusions:  diltiazem (CARDIZEM) infusion 5 mg/hr (02/04/22 0905)     LOS: 1 day    Time spent: 54mn    PDomenic Polite MD [facility]   02/04/2022, 11:51 AM

## 2022-02-04 NOTE — ED Notes (Signed)
Patient c/o nausea. HR elevated 140-170. BP elevated; SBP 180s. EKG obtained. Hydralazine administered. Patient denies CP. MD notified; medication administered as ordered. Will continue to monitor.

## 2022-02-04 NOTE — Progress Notes (Signed)
Heart Failure Navigator Progress Note  Assessed for Heart & Vascular TOC clinic readiness.  Patient does not meet criteria due to patient has a close follow up appointment with [facility] on 02/09/22.   Navigator will sign off at this time. Earnestine Leys, BSN, RN Heart Failure Transport planner Only

## 2022-02-05 DIAGNOSIS — R112 Nausea with vomiting, unspecified: Secondary | ICD-10-CM | POA: Diagnosis not present

## 2022-02-05 LAB — CBC
HCT: 35 % — ABNORMAL LOW (ref 36.0–46.0)
Hemoglobin: 11.1 g/dL — ABNORMAL LOW (ref 12.0–15.0)
MCH: 24.1 pg — ABNORMAL LOW (ref 26.0–34.0)
MCHC: 31.7 g/dL (ref 30.0–36.0)
MCV: 75.9 fL — ABNORMAL LOW (ref 80.0–100.0)
Platelets: 204 10*3/uL (ref 150–400)
RBC: 4.61 MIL/uL (ref 3.87–5.11)
RDW: 16.5 % — ABNORMAL HIGH (ref 11.5–15.5)
WBC: 10.9 10*3/uL — ABNORMAL HIGH (ref 4.0–10.5)
nRBC: 0 % (ref 0.0–0.2)

## 2022-02-05 LAB — COMPREHENSIVE METABOLIC PANEL
ALT: 44 U/L (ref 0–44)
AST: 53 U/L — ABNORMAL HIGH (ref 15–41)
Albumin: 3.4 g/dL — ABNORMAL LOW (ref 3.5–5.0)
Alkaline Phosphatase: 51 U/L (ref 38–126)
Anion gap: 7 (ref 5–15)
BUN: 20 mg/dL (ref 8–23)
CO2: 26 mmol/L (ref 22–32)
Calcium: 9 mg/dL (ref 8.9–10.3)
Chloride: 108 mmol/L (ref 98–111)
Creatinine, Ser: 1.11 mg/dL — ABNORMAL HIGH (ref 0.44–1.00)
GFR, Estimated: 48 mL/min — ABNORMAL LOW (ref >60)
Glucose, Bld: 112 mg/dL — ABNORMAL HIGH (ref 70–99)
Potassium: 4.5 mmol/L (ref 3.5–5.1)
Sodium: 141 mmol/L (ref 135–145)
Total Bilirubin: 1 mg/dL (ref 0.3–1.2)
Total Protein: 6.1 g/dL — ABNORMAL LOW (ref 6.5–8.1)

## 2022-02-05 LAB — GLUCOSE, CAPILLARY
Glucose-Capillary: 139 mg/dL — ABNORMAL HIGH (ref 70–99)
Glucose-Capillary: 138 mg/dL — ABNORMAL HIGH (ref 70–99)
Glucose-Capillary: 176 mg/dL — ABNORMAL HIGH (ref 70–99)
Glucose-Capillary: 108 mg/dL — ABNORMAL HIGH (ref 70–99)
Glucose-Capillary: 204 mg/dL — ABNORMAL HIGH (ref 70–99)

## 2022-02-05 MED ORDER — CARVEDILOL 6.25 MG PO TABS
6.2500 mg | ORAL_TABLET | Freq: Two times a day (BID) | ORAL | Status: DC
  Administered 2022-02-05 – 2022-02-07 (×4): 6.25 mg via ORAL
  Filled 2022-02-05 (×4): qty 1

## 2022-02-05 MED ORDER — CARVEDILOL 12.5 MG PO TABS
12.5000 mg | ORAL_TABLET | Freq: Two times a day (BID) | ORAL | Status: DC
  Administered 2022-02-05: 12.5 mg via ORAL
  Filled 2022-02-05: qty 1

## 2022-02-05 NOTE — Plan of Care (Signed)

## 2022-02-05 NOTE — Progress Notes (Signed)
PROGRESS NOTE    [name]  OIZ:[id number] DOB: 1932/09/11 DOA: 02/03/2022 PCP: Antony Contras, MD  89/F with history of DM2, hypertension, AAA, PVD, TIA, cerebral aneurysm status post coiling, GERD, duodenitis, IBS, anxiety.  Admitted last month in early December for new onset A-fib with RVR and was started on Coreg and Eliquis.  -Presented to the ED 1/4 with nausea vomiting and diarrhea -In the ED she was noted to be hypertensive, blood pressure 220, mildly hypoxic requiring 2 L O2.  Labs were unremarkable except for mildly elevated high-sensitivity troponin, flu and COVID PCR were negative, CT abdomen pelvis negative for acute findings, chest x-ray noted pulmonary vascular congestion and possible mild interstitial edema   Subjective: -Still with some nausea, just converted to sinus rhythm, breathing fair, denies abdominal pain, no further diarrhea  Assessment and Plan   Nausea/vomiting/diarrhea -Suspect viral gastroenteritis, flu and COVID PCR negative, CT abdomen pelvis without acute findings -Supportive care, C. difficile and GI pathogen panel pending, some persistent nausea -Diet as tolerated -Increase activity, PT eval  Atrial fibrillation with RVR -Recent echo with preserved EF, required Cardizem drip yesterday, converted to sinus rhythm this morning,  -continue Coreg and Eliquis  Acute on chronic diastolic CHF Mild hypoxia -Likely triggered by A-fib, has been taking Lasix 3 times a week, chest x-ray with pulmonary vascular congestion and mild edema -Treated with 2 doses of IV Lasix, appears close to euvolemic, hold further diuretics today -Last echo 12/23 with EF 55-60%, grade 1 DD -Incentive spirometry, wean O2 as tolerated  elevated troponin -Likely secondary to demand in the setting of CHF, A-fib -Do not suspect ACS   Hypertensive urgency -Systolic as high as 338, now improved  -Continue Coreg, diuretics as above   Insulin-dependent diabetes -A1c 6.2 on  01/02/2022 -Sensitive sliding scale insulin ACHS -Restart Lantus at low-dose  DVT prophylaxis: Eliquis Code Status: DNR Family Communication: None present at bedside Disposition Plan: Home in 1 to 2 days  Consultants:    Procedures:   Antimicrobials:    Objective: Vitals:   02/05/22 0954 02/05/22 1030 02/05/22 1100 02/05/22 1148  BP: (!) 109/43 (!) 120/50 134/61 (!) 137/56  Pulse: (!) 53 (!) 54 60 (!) 56  Resp: '20 20  18  '$ Temp:    98.4 F (36.9 C)  TempSrc:    Oral  SpO2: 97% 94% 95% 96%  Weight:      Height:        Intake/Output Summary (Last 24 hours) at 02/05/2022 1327 Last data filed at 02/05/2022 1058 Gross per 24 hour  Intake 603.55 ml  Output 725 ml  Net -121.45 ml   Filed Weights   02/03/22 1301 02/04/22 1605 02/05/22 0328  Weight: 72.2 kg 65.5 kg 66.4 kg    Examination:  General exam: Pleasant elderly chronically ill female laying in bed, AAOx3 HEENT: Positive JVD CVS: S1-S2, regular rhythm Lungs: Decreased breath sounds to bases Abdomen: Soft, nontender, bowel sounds present Extremities: Trace edema Skin: No rashes Psychiatry:  Mood & affect appropriate.     Data Reviewed:   CBC: Recent Labs  Lab 02/03/22 1420 02/05/22 0228  WBC 6.5 10.9*  NEUTROABS 3.6  --   HGB 11.7* 11.1*  HCT 36.1 35.0*  MCV 75.5* 75.9*  PLT 188 250   Basic Metabolic Panel: Recent Labs  Lab 02/03/22 1420 02/04/22 0459 02/05/22 0228  NA 138 142 141  K 3.8 3.3* 4.5  CL 102 104 108  CO2 '25 24 26  '$ GLUCOSE 130* 93  112*  BUN '14 12 20  '$ CREATININE 0.83 0.83 1.11*  CALCIUM 8.8* 8.6* 9.0   GFR: Estimated Creatinine Clearance: 32.7 mL/min (A) (by C-G formula based on SCr of 1.11 mg/dL (H)). Liver Function Tests: Recent Labs  Lab 02/03/22 1420 02/05/22 0228  AST 49* 53*  ALT 51* 44  ALKPHOS 56 51  BILITOT 0.7 1.0  PROT 6.4* 6.1*  ALBUMIN 3.7 3.4*   Recent Labs  Lab 02/03/22 1420  LIPASE 23   No results for input(s): "AMMONIA" in the last 168  hours. Coagulation Profile: No results for input(s): "INR", "PROTIME" in the last 168 hours. Cardiac Enzymes: No results for input(s): "CKTOTAL", "CKMB", "CKMBINDEX", "TROPONINI" in the last 168 hours. BNP (last 3 results) No results for input(s): "PROBNP" in the last 8760 hours. HbA1C: No results for input(s): "HGBA1C" in the last 72 hours. CBG: Recent Labs  Lab 02/04/22 1756 02/04/22 2122 02/05/22 0807 02/05/22 0855 02/05/22 1216  GLUCAP 135* 141* 139* 138* 176*   Lipid Profile: No results for input(s): "CHOL", "HDL", "LDLCALC", "TRIG", "CHOLHDL", "LDLDIRECT" in the last 72 hours. Thyroid Function Tests: No results for input(s): "TSH", "T4TOTAL", "FREET4", "T3FREE", "THYROIDAB" in the last 72 hours. Anemia Panel: No results for input(s): "VITAMINB12", "FOLATE", "FERRITIN", "TIBC", "IRON", "RETICCTPCT" in the last 72 hours. Urine analysis:    Component Value Date/Time   COLORURINE YELLOW 02/03/2022 2122   APPEARANCEUR CLEAR 02/03/2022 2122   LABSPEC 1.035 (H) 02/03/2022 2122   PHURINE 5.0 02/03/2022 2122   GLUCOSEU NEGATIVE 02/03/2022 2122   HGBUR MODERATE (A) 02/03/2022 2122   BILIRUBINUR NEGATIVE 02/03/2022 2122   KETONESUR 20 (A) 02/03/2022 2122   PROTEINUR 100 (A) 02/03/2022 2122   UROBILINOGEN 0.2 04/01/2014 1627   NITRITE NEGATIVE 02/03/2022 2122   LEUKOCYTESUR TRACE (A) 02/03/2022 2122   Sepsis Labs: '@LABRCNTIP'$ (procalcitonin:4,lacticidven:4)  ) Recent Results (from the past 240 hour(s))  Urine Culture     Status: Abnormal   Collection Time: 02/03/22  4:02 PM   Specimen: Urine, Clean Catch  Result Value Ref Range Status   Specimen Description URINE, CLEAN CATCH  Final   Special Requests   Final    NONE Performed at [facility], [place] [address] [place], [place] [address]    Culture MULTIPLE SPECIES PRESENT, SUGGEST RECOLLECTION (A)  Final   Report Status 02/04/2022 FINAL  Final  Resp panel by RT-PCR (RSV, Flu A&B, Covid) Anterior Nasal Swab      Status: None   Collection Time: 02/03/22  8:05 PM   Specimen: Anterior Nasal Swab  Result Value Ref Range Status   SARS Coronavirus 2 by RT PCR NEGATIVE NEGATIVE Final    Comment: (NOTE) SARS-CoV-2 target nucleic acids are NOT DETECTED.  The SARS-CoV-2 RNA is generally detectable in upper respiratory specimens during the acute phase of infection. The lowest concentration of SARS-CoV-2 viral copies this assay can detect is 138 copies/mL. A negative result does not preclude SARS-Cov-2 infection and should not be used as the sole basis for treatment or other patient management decisions. A negative result may occur with  improper specimen collection/handling, submission of specimen other than nasopharyngeal swab, presence of viral mutation(s) within the areas targeted by this assay, and inadequate number of viral copies(<138 copies/mL). A negative result must be combined with clinical observations, patient history, and epidemiological information. The expected result is Negative.  Fact Sheet for Patients:  EntrepreneurPulse.com.au  Fact Sheet for Healthcare Providers:  IncredibleEmployment.be  This test is no t yet approved or cleared by  the [employer] and  has been authorized for detection and/or diagnosis of SARS-CoV-2 by FDA under an Emergency Use Authorization (EUA). This EUA will remain  in effect (meaning this test can be used) for the duration of the COVID-19 declaration under Section 564(b)(1) of the Act, 21 U.S.C.section 360bbb-3(b)(1), unless the authorization is terminated  or revoked sooner.       Influenza A by PCR NEGATIVE NEGATIVE Final   Influenza B by PCR NEGATIVE NEGATIVE Final    Comment: (NOTE) The Xpert Xpress SARS-CoV-2/FLU/RSV plus assay is intended as an aid in the diagnosis of influenza from Nasopharyngeal swab specimens and should not be used as a sole basis for treatment. Nasal washings and aspirates are  unacceptable for Xpert Xpress SARS-CoV-2/FLU/RSV testing.  Fact Sheet for Patients: EntrepreneurPulse.com.au  Fact Sheet for Healthcare Providers: IncredibleEmployment.be  This test is not yet approved or cleared by the [place] FDA and has been authorized for detection and/or diagnosis of SARS-CoV-2 by FDA under an Emergency Use Authorization (EUA). This EUA will remain in effect (meaning this test can be used) for the duration of the COVID-19 declaration under Section 564(b)(1) of the Act, 21 U.S.C. section 360bbb-3(b)(1), unless the authorization is terminated or revoked.     Resp Syncytial Virus by PCR NEGATIVE NEGATIVE Final    Comment: (NOTE) Fact Sheet for Patients: EntrepreneurPulse.com.au  Fact Sheet for Healthcare Providers: IncredibleEmployment.be  This test is not yet approved or cleared by the [place] FDA and has been authorized for detection and/or diagnosis of SARS-CoV-2 by FDA under an Emergency Use Authorization (EUA). This EUA will remain in effect (meaning this test can be used) for the duration of the COVID-19 declaration under Section 564(b)(1) of the Act, 21 U.S.C. section 360bbb-3(b)(1), unless the authorization is terminated or revoked.  Performed at [facility], [place] [address], [place], [place] [address]      Radiology Studies: DG Chest Portable 1 View  Result Date: 02/03/2022 CLINICAL DATA:  Shortness of breath EXAM: PORTABLE CHEST 1 VIEW COMPARISON:  01/01/2022 FINDINGS: Cardiomegaly. Central vascular congestion. Possible mild interstitial edema. No pleural effusion or focal airspace disease. Aortic atherosclerosis. IMPRESSION: Cardiomegaly with central vascular congestion and possible mild interstitial edema. Electronically Signed   By: Donavan Foil M.D.   On: 02/03/2022 21:02   CT ABDOMEN PELVIS W CONTRAST  Result Date: 02/03/2022 CLINICAL DATA:  Left lower  quadrant abdominal pain. EXAM: CT ABDOMEN AND PELVIS WITH CONTRAST TECHNIQUE: Multidetector CT imaging of the abdomen and pelvis was performed using the standard protocol following bolus administration of intravenous contrast. RADIATION DOSE REDUCTION: This exam was performed according to the departmental dose-optimization program which includes automated exposure control, adjustment of the mA and/or kV according to patient size and/or use of iterative reconstruction technique. CONTRAST:  6m OMNIPAQUE IOHEXOL 350 MG/ML SOLN COMPARISON:  Multiple prior CT examinations including 01/02/2022 and older FINDINGS: Lower chest: Mild linear opacity lung bases likely scar or atelectasis. No pleural effusion. The heart is enlarged. Hepatobiliary: Diffuse fatty liver infiltration. No enhancing liver lesion. Patent portal vein. The gallbladder is nondilated. Pancreas: Severe fatty atrophy of the pancreas. Spleen: Spleen is nonenlarged. Adrenals/Urinary Tract: Slight thickening of both adrenal glands is noted, nonspecific and not significantly changed from the recent prior [facility]. No enhancing renal mass. No collecting system dilatation. Preserved contour to the urinary bladder. Stomach/Bowel: On this non oral contrast exam, large bowel is of normal course and caliber. Left-sided colonic diverticula. Scattered stool. Stomach is  nondilated. Small bowel is nondilated. No free air or free fluid. Vascular/Lymphatic: Normal caliber aorta and IVC. Minimal atherosclerotic calcifications along the aorta and branch vessels. No developing abnormal lymph node enlargement present in the abdomen and pelvis. Reproductive: Prostate is unremarkable. Other: No abdominal wall hernia or abnormality. No abdominopelvic ascites. Musculoskeletal: Scattered degenerative changes of the spine. There is trace anterolisthesis of L5 on S1. Multilevel disc bulging in the lumbar spine region with some canal narrowing. IMPRESSION: No bowel  obstruction, free air or free fluid. Few colonic diverticula. Fatty liver infiltration. Electronically Signed   By: Jill Side M.D.   On: 02/03/2022 17:46     Scheduled Meds:  apixaban  5 mg Oral BID   carvedilol  6.25 mg Oral BID WC   diltiazem  10 mg Intravenous Once   insulin aspart  0-5 Units Subcutaneous QHS   insulin aspart  0-9 Units Subcutaneous TID WC   Continuous Infusions:     LOS: 2 days    Time spent: 88mn    PDomenic Polite MD [facility]   02/05/2022, 1:27 PM

## 2022-02-05 NOTE — Progress Notes (Signed)
Patient noted to be in SB rhythm on monitor.  HR 55-60 SB with frequent PAC, BP 109/43.  Patient denies dizziness or SOB.  EKG being done.  Cardizem gtt IV stopped due to HR less than 60. Dr. Broadus John notified.

## 2022-02-06 DIAGNOSIS — R112 Nausea with vomiting, unspecified: Secondary | ICD-10-CM | POA: Diagnosis not present

## 2022-02-06 LAB — BASIC METABOLIC PANEL
Anion gap: 10 (ref 5–15)
BUN: 27 mg/dL — ABNORMAL HIGH (ref 8–23)
CO2: 28 mmol/L (ref 22–32)
Calcium: 8.9 mg/dL (ref 8.9–10.3)
Chloride: 101 mmol/L (ref 98–111)
Creatinine, Ser: 0.91 mg/dL (ref 0.44–1.00)
GFR, Estimated: >60 mL/min (ref >60)
Glucose, Bld: 93 mg/dL (ref 70–99)
Potassium: 4.7 mmol/L (ref 3.5–5.1)
Sodium: 139 mmol/L (ref 135–145)

## 2022-02-06 LAB — GLUCOSE, CAPILLARY
Glucose-Capillary: 150 mg/dL — ABNORMAL HIGH (ref 70–99)
Glucose-Capillary: 228 mg/dL — ABNORMAL HIGH (ref 70–99)
Glucose-Capillary: 125 mg/dL — ABNORMAL HIGH (ref 70–99)
Glucose-Capillary: 249 mg/dL — ABNORMAL HIGH (ref 70–99)

## 2022-02-06 MED ORDER — DILTIAZEM HCL-DEXTROSE 125-5 MG/125ML-% IV SOLN (PREMIX)
5.0000 mg/h | INTRAVENOUS | Status: AC
  Administered 2022-02-06 (×2): 5 mg/h via INTRAVENOUS
  Filled 2022-02-06 (×2): qty 125

## 2022-02-06 MED ORDER — DILTIAZEM HCL 60 MG PO TABS
60.0000 mg | ORAL_TABLET | Freq: Three times a day (TID) | ORAL | Status: DC
  Administered 2022-02-06 – 2022-02-07 (×2): 60 mg via ORAL
  Filled 2022-02-06 (×2): qty 1

## 2022-02-06 NOTE — Progress Notes (Signed)
PT Cancellation Note  Patient Details Name: TRANIKA SCHOLLER MRN: [id number] DOB: [birth year]   Cancelled Treatment:    Reason Eval/Treat Not Completed: Patient declines mobility secondary to nausea; RN present to give Zofran. Will follow up for PT Evaluation as schedule permits.  Mabeline Caras, PT, DPT Acute Rehabilitation Services  Personal: [facility] Office: [place] 02/06/2022, 8:19 AM

## 2022-02-06 NOTE — Progress Notes (Signed)
  X-cover Note: RN reports pt back in rapid afib. Will restart cardizem gtts.    Kristopher Oppenheim, DO [facility]

## 2022-02-06 NOTE — Evaluation (Signed)
Physical Therapy Evaluation Patient Details Name: [name] MRN: [id number] DOB: [birth year] Today's Date: 02/06/2022  History of Present Illness  Pt is an 89 y.o. female admitted 02/03/21 with nausea, vomiting, diarrhea; suspect viral gastroenteritis. Pt also with HTN, afib with RVR. Recent admission 12/2021 for new onset afib with RVR. PMH includes DM2, HTN, AAA, PVD, TIA, cerebral aneurysm s/p coiling, IBS, duodenitis, anxiety.   Clinical Impression  Pt presents with an overall decrease in functional mobility secondary to above. PTA, pt mod indep with SPC, lives with husband who has dementia (he has daily caregivers 5x/wk, otherwise pt assists him at home). Today, pt only able to walk 8' before needing to sit suddenly due to pre-syncopal symptoms. Pt limited by generalized weakness, decreased activity tolerance and impaired balance strategies. Pt reports family likely unable to provide increased assist at home. Hopeful pt's mobility with improve as her symptoms do, otherwise pt will require SNF-level therapies to maximize functional mobility and independence prior to return home.     Post-ambulation BP 67/54 (60) Return to supine BP 103/57 (71)    Recommendations for follow up therapy are one component of a multi-disciplinary discharge planning process, led by the attending physician.  Recommendations may be updated based on patient status, additional functional criteria and insurance authorization.  Follow Up Recommendations Skilled nursing-short term rehab (<3 hours/day) Can patient physically be transported by private vehicle: Yes    Assistance Recommended at Discharge Frequent or constant Supervision/Assistance  Patient can return home with the following  A little help with walking and/or transfers;A little help with bathing/dressing/bathroom;Assistance with cooking/housework;Assist for transportation;Help with stairs or ramp for entrance    Equipment Recommendations  (TBD)   Recommendations for Other Services   Mobility Specialist   Functional Status Assessment Patient has had a recent decline in their functional status and demonstrates the ability to make significant improvements in function in a reasonable and predictable amount of time.     Precautions / Restrictions Precautions Precautions: Fall;Other (comment) Precaution Comments: Watch BP (soft BP with near-sycnope during session 1/7) Restrictions Weight Bearing Restrictions: No      Mobility  Bed Mobility Overal bed mobility: Needs Assistance Bed Mobility: Supine to Sit, Sit to Supine     Supine to sit: Modified independent (Device/Increase time) Sit to supine: Min assist   General bed mobility comments: minA for LE management return to supine, pt with increased fatigue and lightheadedness    Transfers Overall transfer level: Needs assistance Equipment used: Straight cane, Rolling walker (2 wheels) Transfers: Sit to/from Stand Sit to Stand: Min assist, Min guard           General transfer comment: initial stand with SPC, pt requiring minA for trunk elevation and stability, reaching for HHA to balance; additional 2x stand with RW, min guard for balance    Ambulation/Gait Ambulation/Gait assistance: Min guard, Min assist Gait Distance (Feet): 8 Feet Assistive device: Rolling walker (2 wheels) Gait Pattern/deviations: Step-through pattern, Decreased stride length, Shuffle, Trunk flexed Gait velocity: Decreased     General Gait Details: slow, labored gait with RW adn min guard for balance; noted decreased responsiveness and near-syncopal symptoms, so had pt sit immediately on bed, BP down to 67/54  Stairs            Wheelchair Mobility    Modified Rankin (Stroke Patients Only)       Balance Overall balance assessment: Needs assistance Sitting-balance support: No upper extremity supported, Feet supported Sitting balance-Leahy Scale: Fair  Standing balance  support: Single extremity supported, During functional activity, Reliant on assistive device for balance Standing balance-Leahy Scale: Poor Standing balance comment: stability improved with BUE support                             Pertinent Vitals/Pain Pain Assessment Pain Assessment: Faces Faces Pain Scale: Hurts a little bit Pain Location: generalized Pain Descriptors / Indicators: Tiring, Discomfort Pain Intervention(s): Monitored during session, Limited activity within patient's tolerance    Home Living Family/patient expects to be discharged to:: Private residence Living Arrangements: Spouse/significant other Available Help at Discharge: Family;Personal care attendant Type of Home: House Home Access: Level entry       Home Layout: One level Home Equipment: Conservation officer, nature (2 wheels);Cane - single point;Tub bench;Grab bars - tub/shower Additional Comments: pt lives with husband who has dementia; he has PCA 5x/wk, but pt cares for him overnight and weekends. husband has family helping him while she is admitted, but they work and cannot provide increased assist consistently    Prior Function Prior Level of Function : Independent/Modified Independent             Mobility Comments: mod indep with SPC for household ambulation, uses walker in community ADLs Comments: independent with ADLs, sponge baths; children help with iADLs     Hand Dominance        Extremity/Trunk Assessment   Upper Extremity Assessment Upper Extremity Assessment: Generalized weakness    Lower Extremity Assessment Lower Extremity Assessment: Generalized weakness       Communication   Communication: No difficulties  Cognition Arousal/Alertness: Awake/alert Behavior During Therapy: WFL for tasks assessed/performed Overall Cognitive Status: Within Functional Limits for tasks assessed                                 General Comments: WFL for simple tasks, though not  forthcoming with symptoms        General Comments General comments (skin integrity, edema, etc.): difficulty getting consistent BP readings, BP down to 67/54 post brief ambulation, reading 103/57 after return to supine. initiated discussion regarding safe d/c plan and support available at home; pt reports family unable to consistently help due to work schedules    Exercises     Assessment/Plan    PT Assessment Patient needs continued PT services  PT Problem List Decreased strength;Decreased activity tolerance;Decreased balance;Decreased mobility;Cardiopulmonary status limiting activity       PT Treatment Interventions DME instruction;Gait training;Functional mobility training;Therapeutic activities;Therapeutic exercise;Balance training;Patient/family education    PT Goals (Current goals can be found in the Care Plan section)  Acute Rehab PT Goals Patient Stated Goal: feel better, be able to do more, return home to husband PT Goal Formulation: With patient Time For Goal Achievement: 02/20/22 Potential to Achieve Goals: Good    Frequency Min 3X/week     Co-evaluation               AM-PAC PT "6 Clicks" Mobility  Outcome Measure Help needed turning from your back to your side while in a flat bed without using bedrails?: A Little Help needed moving from lying on your back to sitting on the side of a flat bed without using bedrails?: A Little Help needed moving to and from a bed to a chair (including a wheelchair)?: A Little Help needed standing up from a chair using your arms (e.g., wheelchair or  bedside chair)?: A Little Help needed to walk in hospital room?: Total Help needed climbing 3-5 steps with a railing? : A Lot 6 Click Score: 15    End of Session Equipment Utilized During Treatment: Gait belt Activity Tolerance: Treatment limited secondary to medical complications (Comment) Patient left: in bed;with call bell/phone within reach;with bed alarm set Nurse  Communication: Mobility status;Other (comment) (hypotension, near-syncope) PT Visit Diagnosis: Other abnormalities of gait and mobility (R26.89);Muscle weakness (generalized) (M62.81)    Time: 8937-3428 PT Time Calculation (min) (ACUTE ONLY): 24 min   Charges:   PT Evaluation $PT Eval Moderate Complexity: [place], PT, DPT Acute Rehabilitation Services  Personal: [facility] Office: [place] 02/06/2022, 12:56 PM

## 2022-02-06 NOTE — Progress Notes (Signed)
   02/06/22 0431  Assess: MEWS Score  BP (!) 156/100  MAP (mmHg) 117  Pulse Rate (!) 110  ECG Heart Rate (!) 118  SpO2 94 %  Assess: MEWS Score  MEWS Temp 0  MEWS Systolic 0  MEWS Pulse 2  MEWS RR 0  MEWS LOC 0  MEWS Score 2  MEWS Score Color Yellow  Assess: if the MEWS score is Yellow or Red  Were vital signs taken at a resting state? Yes  Focused Assessment Change from prior assessment (see assessment flowsheet)  Does the patient meet 2 or more of the SIRS criteria? No  Does the patient have a confirmed or suspected source of infection? No  Provider and Rapid Response Notified? Yes  MEWS guidelines implemented *See Row Information* Yes  Treat  MEWS Interventions Escalated (See documentation below) (converted to afib-Dr. Bridgett Larsson notified)  Pain Scale 0-10  Pain Score 0  Take Vital Signs  Increase Vital Sign Frequency  Yellow: Q 2hr X 2 then Q 4hr X 2, if remains yellow, continue Q 4hrs  Escalate  MEWS: Escalate Yellow: discuss with charge nurse/RN and consider discussing with provider and RRT  Notify: Charge Nurse/RN  Name of Charge Nurse/RN Notified Sharee Pimple  Date Charge Nurse/RN Notified 02/06/22  Time Charge Nurse/RN Notified 0455  Provider Notification  Provider Name/Title Dr. Bridgett Larsson  Date Provider Notified 02/06/22  Time Provider Notified [phone]  Method of Notification Page  Notification Reason New onset of dysrhythmia  Provider response See new orders;Evaluate remotely  Date of Provider Response 02/06/22  Time of Provider Response [phone]  Document  Patient Outcome Other (Comment) (outcome pending)  Progress note created (see row info) Yes  Assess: SIRS CRITERIA  SIRS Temperature  0  SIRS Pulse 1  SIRS Respirations  0  SIRS WBC 0  SIRS Score Sum  1

## 2022-02-06 NOTE — Progress Notes (Addendum)
Pharmacist Heart Failure Core Measure Documentation  Assessment: [name] has an EF documented as 55-60% on 01/03/2022   Rationale: Heart failure patients with left ventricular systolic dysfunction (LVSD) and an EF < 40% should be prescribed an angiotensin converting enzyme inhibitor (ACEI) or angiotensin receptor blocker (ARB) at discharge unless a contraindication is documented in the medical record.  This patient is not currently on an ACEI or ARB for HF. Patient takes trandalopril '4mg'$  twice daily PTA  This note is being placed in the record in order to provide documentation that a contraindication to the use of these agents is present for this encounter.  ACE Inhibitor or Angiotensin Receptor Blocker is contraindicated (specify all that apply)  '[]'$   ACEI allergy AND ARB allergy '[]'$   Angioedema '[]'$   Moderate or severe aortic stenosis '[]'$   Hyperkalemia '[x]'$   Hypotension '[]'$   Renal artery stenosis '[x]'$   Worsening renal function, preexisting renal disease or dysfunction   Sandford Craze, PharmD. [facility] Acute Care PGY-1  02/06/2022 2:24 PM

## 2022-02-06 NOTE — Progress Notes (Signed)
PROGRESS NOTE    [name]  OZY:[id number] DOB: 13-Sep-1932 DOA: 02/03/2022 PCP: Antony Contras, MD  89/F with history of DM2, hypertension, AAA, PVD, TIA, cerebral aneurysm status post coiling, GERD, duodenitis, IBS, anxiety.  Admitted last month in early December for new onset A-fib with RVR and was started on Coreg and Eliquis.  -Presented to the ED 1/4 with nausea vomiting and diarrhea -In the ED she was noted to be hypertensive, blood pressure 220, mildly hypoxic requiring 2 L O2.  Labs were unremarkable except for mildly elevated high-sensitivity troponin, flu and COVID PCR were negative, CT abdomen pelvis negative for acute findings, chest x-ray noted pulmonary vascular congestion and possible mild interstitial edema. -Complicated by A-fib RVR -gastroenteritis improving  Subjective: -Continues to have nausea, no further vomiting or diarrhea -Back in A-fib with RVR, on Cardizem gtt. early this morning  Assessment and Plan   Nausea/vomiting/diarrhea -Suspect viral gastroenteritis, flu and COVID PCR negative, CT abdomen pelvis without acute findings -Continue supportive care, no further diarrhea -Diet as tolerated -Increase activity, PT eval  Atrial fibrillation with RVR -Recent echo with preserved EF, required Cardizem drip on admission -Back in A-fib with some RVR earlier this morning, on Cardizem gtt. -continue Coreg and Eliquis  Acute on chronic diastolic CHF Mild hypoxia -Likely triggered by A-fib, has been taking Lasix 3 times a week, chest x-ray with pulmonary vascular congestion and mild edema -Treated with 2 doses of IV Lasix, appears close to euvolemic, hold further diuretics today -Last echo 12/23 with EF 55-60%, grade 1 DD -Incentive spirometry, wean O2 as tolerated  elevated troponin -Likely secondary to demand in the setting of CHF, A-fib -Do not suspect ACS   Hypertensive urgency -Systolic as high as 048, now improved  -Continue Coreg, diuretics as  above   Insulin-dependent diabetes -A1c 6.2 on 01/02/2022 -Sensitive sliding scale insulin ACHS -Restart Lantus at low-dose  DVT prophylaxis: Eliquis Code Status: DNR Family Communication: None present at bedside Disposition Plan: Home in 1 to 2 days  Consultants:    Procedures:   Antimicrobials:    Objective: Vitals:   02/06/22 0645 02/06/22 0700 02/06/22 0800 02/06/22 0900  BP: (!) 122/59 113/62 (!) 120/107 118/72  Pulse:  86 77 (!) 102  Resp: 20     Temp:      TempSrc:      SpO2: 95% 96% 96% 96%  Weight:      Height:        Intake/Output Summary (Last 24 hours) at 02/06/2022 1336 Last data filed at 02/06/2022 1100 Gross per 24 hour  Intake 540.12 ml  Output 200 ml  Net 340.12 ml   Filed Weights   02/04/22 1605 02/05/22 0328 02/06/22 0517  Weight: 65.5 kg 66.4 kg 65.8 kg    Examination:  General exam: Pleasant elderly chronically ill female laying in bed, AAOx3 HEENT: No JVD CVS: S1-S2, regular rhythm Lungs: Decreased breath sounds at the bases Abdomen: Soft, nontender, bowel sounds present Extremities: Trace edema  Skin: No rashes Psychiatry:  Mood & affect appropriate.     Data Reviewed:   CBC: Recent Labs  Lab 02/03/22 1420 02/05/22 0228  WBC 6.5 10.9*  NEUTROABS 3.6  --   HGB 11.7* 11.1*  HCT 36.1 35.0*  MCV 75.5* 75.9*  PLT 188 889   Basic Metabolic Panel: Recent Labs  Lab 02/03/22 1420 02/04/22 0459 02/05/22 0228 02/06/22 0136  NA 138 142 141 139  K 3.8 3.3* 4.5 4.7  CL 102 104 108 101  CO2 '25 24 26 28  '$ GLUCOSE 130* 93 112* 93  BUN '14 12 20 '$ 27*  CREATININE 0.83 0.83 1.11* 0.91  CALCIUM 8.8* 8.6* 9.0 8.9   GFR: Estimated Creatinine Clearance: 39.7 mL/min (by C-G formula based on SCr of 0.91 mg/dL). Liver Function Tests: Recent Labs  Lab 02/03/22 1420 02/05/22 0228  AST 49* 53*  ALT 51* 44  ALKPHOS 56 51  BILITOT 0.7 1.0  PROT 6.4* 6.1*  ALBUMIN 3.7 3.4*   Recent Labs  Lab 02/03/22 1420  LIPASE 23   No results  for input(s): "AMMONIA" in the last 168 hours. Coagulation Profile: No results for input(s): "INR", "PROTIME" in the last 168 hours. Cardiac Enzymes: No results for input(s): "CKTOTAL", "CKMB", "CKMBINDEX", "TROPONINI" in the last 168 hours. BNP (last 3 results) No results for input(s): "PROBNP" in the last 8760 hours. HbA1C: No results for input(s): "HGBA1C" in the last 72 hours. CBG: Recent Labs  Lab 02/05/22 1216 02/05/22 1629 02/05/22 2036 02/06/22 0820 02/06/22 1115  GLUCAP 176* 108* 204* 150* 228*   Lipid Profile: No results for input(s): "CHOL", "HDL", "LDLCALC", "TRIG", "CHOLHDL", "LDLDIRECT" in the last 72 hours. Thyroid Function Tests: No results for input(s): "TSH", "T4TOTAL", "FREET4", "T3FREE", "THYROIDAB" in the last 72 hours. Anemia Panel: No results for input(s): "VITAMINB12", "FOLATE", "FERRITIN", "TIBC", "IRON", "RETICCTPCT" in the last 72 hours. Urine analysis:    Component Value Date/Time   COLORURINE YELLOW 02/03/2022 2122   APPEARANCEUR CLEAR 02/03/2022 2122   LABSPEC 1.035 (H) 02/03/2022 2122   PHURINE 5.0 02/03/2022 2122   GLUCOSEU NEGATIVE 02/03/2022 2122   HGBUR MODERATE (A) 02/03/2022 2122   BILIRUBINUR NEGATIVE 02/03/2022 2122   KETONESUR 20 (A) 02/03/2022 2122   PROTEINUR 100 (A) 02/03/2022 2122   UROBILINOGEN 0.2 04/01/2014 1627   NITRITE NEGATIVE 02/03/2022 2122   LEUKOCYTESUR TRACE (A) 02/03/2022 2122   Sepsis Labs: '@LABRCNTIP'$ (procalcitonin:4,lacticidven:4)  ) Recent Results (from the past 240 hour(s))  Urine Culture     Status: Abnormal   Collection Time: 02/03/22  4:02 PM   Specimen: Urine, Clean Catch  Result Value Ref Range Status   Specimen Description URINE, CLEAN CATCH  Final   Special Requests   Final    NONE Performed at [facility], [place] [address] [place], [place] [address]    Culture MULTIPLE SPECIES PRESENT, SUGGEST RECOLLECTION (A)  Final   Report Status 02/04/2022 FINAL  Final  Resp panel by RT-PCR (RSV,  Flu A&B, Covid) Anterior Nasal Swab     Status: None   Collection Time: 02/03/22  8:05 PM   Specimen: Anterior Nasal Swab  Result Value Ref Range Status   SARS Coronavirus 2 by RT PCR NEGATIVE NEGATIVE Final    Comment: (NOTE) SARS-CoV-2 target nucleic acids are NOT DETECTED.  The SARS-CoV-2 RNA is generally detectable in upper respiratory specimens during the acute phase of infection. The lowest concentration of SARS-CoV-2 viral copies this assay can detect is 138 copies/mL. A negative result does not preclude SARS-Cov-2 infection and should not be used as the sole basis for treatment or other patient management decisions. A negative result may occur with  improper specimen collection/handling, submission of specimen other than nasopharyngeal swab, presence of viral mutation(s) within the areas targeted by this assay, and inadequate number of viral copies(<138 copies/mL). A negative result must be combined with clinical observations, patient history, and epidemiological information. The expected result is Negative.  Fact Sheet for Patients:  EntrepreneurPulse.com.au  Fact Sheet for Healthcare Providers:  IncredibleEmployment.be  This test is no t yet approved or cleared by the [place] and  has been authorized for detection and/or diagnosis of SARS-CoV-2 by FDA under an Emergency Use Authorization (EUA). This EUA will remain  in effect (meaning this test can be used) for the duration of the COVID-19 declaration under Section 564(b)(1) of the Act, 21 U.S.C.section 360bbb-3(b)(1), unless the authorization is terminated  or revoked sooner.       Influenza A by PCR NEGATIVE NEGATIVE Final   Influenza B by PCR NEGATIVE NEGATIVE Final    Comment: (NOTE) The Xpert Xpress SARS-CoV-2/FLU/RSV plus assay is intended as an aid in the diagnosis of influenza from Nasopharyngeal swab specimens and should not be used as a sole basis for  treatment. Nasal washings and aspirates are unacceptable for Xpert Xpress SARS-CoV-2/FLU/RSV testing.  Fact Sheet for Patients: EntrepreneurPulse.com.au  Fact Sheet for Healthcare Providers: IncredibleEmployment.be  This test is not yet approved or cleared by the [place] FDA and has been authorized for detection and/or diagnosis of SARS-CoV-2 by FDA under an Emergency Use Authorization (EUA). This EUA will remain in effect (meaning this test can be used) for the duration of the COVID-19 declaration under Section 564(b)(1) of the Act, 21 U.S.C. section 360bbb-3(b)(1), unless the authorization is terminated or revoked.     Resp Syncytial Virus by PCR NEGATIVE NEGATIVE Final    Comment: (NOTE) Fact Sheet for Patients: EntrepreneurPulse.com.au  Fact Sheet for Healthcare Providers: IncredibleEmployment.be  This test is not yet approved or cleared by the [place] FDA and has been authorized for detection and/or diagnosis of SARS-CoV-2 by FDA under an Emergency Use Authorization (EUA). This EUA will remain in effect (meaning this test can be used) for the duration of the COVID-19 declaration under Section 564(b)(1) of the Act, 21 U.S.C. section 360bbb-3(b)(1), unless the authorization is terminated or revoked.  Performed at [facility], [place] [address], [place], [place] [address]      Radiology Studies: No results found.   Scheduled Meds:  apixaban  5 mg Oral BID   carvedilol  6.25 mg Oral BID WC   insulin aspart  0-5 Units Subcutaneous QHS   insulin aspart  0-9 Units Subcutaneous TID WC   Continuous Infusions:  diltiazem (CARDIZEM) infusion 10 mg/hr (02/06/22 0509)      LOS: 3 days    Time spent: 73mn    PDomenic Polite MD [facility]   02/06/2022, 1:36 PM

## 2022-02-06 NOTE — Progress Notes (Signed)
OT Cancellation Note  Patient Details Name: [name] MRN: [id number] DOB: 1932-08-17   Cancelled Treatment:    Reason Eval/Treat Not Completed: Medical issues which prohibited therapy (Pt with continued nausea.) Declining mobilizing. Will continue to follow.   Malka So 02/06/2022, 9:50 AM Cleta Alberts, OTR/L Acute Rehabilitation Services Office: [phone]

## 2022-02-07 DIAGNOSIS — R112 Nausea with vomiting, unspecified: Secondary | ICD-10-CM | POA: Diagnosis not present

## 2022-02-07 DIAGNOSIS — I480 Paroxysmal atrial fibrillation: Secondary | ICD-10-CM | POA: Diagnosis not present

## 2022-02-07 LAB — BASIC METABOLIC PANEL
Anion gap: 7 (ref 5–15)
BUN: 26 mg/dL — ABNORMAL HIGH (ref 8–23)
CO2: 26 mmol/L (ref 22–32)
Calcium: 8.7 mg/dL — ABNORMAL LOW (ref 8.9–10.3)
Chloride: 103 mmol/L (ref 98–111)
Creatinine, Ser: 0.94 mg/dL (ref 0.44–1.00)
GFR, Estimated: 59 mL/min — ABNORMAL LOW (ref >60)
Glucose, Bld: 99 mg/dL (ref 70–99)
Potassium: 4 mmol/L (ref 3.5–5.1)
Sodium: 136 mmol/L (ref 135–145)

## 2022-02-07 LAB — CBC
HCT: 37.3 % (ref 36.0–46.0)
Hemoglobin: 11.8 g/dL — ABNORMAL LOW (ref 12.0–15.0)
MCH: 24.3 pg — ABNORMAL LOW (ref 26.0–34.0)
MCHC: 31.6 g/dL (ref 30.0–36.0)
MCV: 76.7 fL — ABNORMAL LOW (ref 80.0–100.0)
Platelets: 249 10*3/uL (ref 150–400)
RBC: 4.86 MIL/uL (ref 3.87–5.11)
RDW: 16.5 % — ABNORMAL HIGH (ref 11.5–15.5)
WBC: 9.4 10*3/uL (ref 4.0–10.5)
nRBC: 0 % (ref 0.0–0.2)

## 2022-02-07 LAB — GLUCOSE, CAPILLARY
Glucose-Capillary: 146 mg/dL — ABNORMAL HIGH (ref 70–99)
Glucose-Capillary: 183 mg/dL — ABNORMAL HIGH (ref 70–99)
Glucose-Capillary: 168 mg/dL — ABNORMAL HIGH (ref 70–99)
Glucose-Capillary: 138 mg/dL — ABNORMAL HIGH (ref 70–99)

## 2022-02-07 MED ORDER — METOPROLOL TARTRATE 25 MG PO TABS: 25.0000 mg | ORAL_TABLET | Freq: Two times a day (BID) | ORAL | Status: DC

## 2022-02-07 MED ORDER — AMIODARONE HCL 200 MG PO TABS: 200.0000 mg | ORAL_TABLET | Freq: Every day | ORAL | Status: DC

## 2022-02-07 MED ORDER — AMIODARONE HCL 200 MG PO TABS: 400.0000 mg | ORAL_TABLET | Freq: Two times a day (BID) | ORAL | Status: DC

## 2022-02-07 MED ORDER — DILTIAZEM HCL ER COATED BEADS 240 MG PO CP24: 240.0000 mg | ORAL_CAPSULE | Freq: Every day | ORAL | Status: DC

## 2022-02-07 MED ORDER — SODIUM CHLORIDE 0.9 % IV SOLN
12.5000 mg | Freq: Once | INTRAVENOUS | Status: AC
  Administered 2022-02-07: 12.5 mg via INTRAVENOUS
  Filled 2022-02-07: qty 12.5

## 2022-02-07 MED ORDER — AMIODARONE HCL 200 MG PO TABS: 200.0000 mg | ORAL_TABLET | Freq: Two times a day (BID) | ORAL | Status: DC

## 2022-02-07 MED ORDER — DILTIAZEM HCL 60 MG PO TABS
60.0000 mg | ORAL_TABLET | Freq: Four times a day (QID) | ORAL | Status: DC
  Administered 2022-02-07: 60 mg via ORAL
  Filled 2022-02-07: qty 1

## 2022-02-07 MED ORDER — METOPROLOL TARTRATE 50 MG PO TABS
50.0000 mg | ORAL_TABLET | Freq: Two times a day (BID) | ORAL | Status: DC
  Administered 2022-02-07: 50 mg via ORAL
  Filled 2022-02-07: qty 1

## 2022-02-07 NOTE — Progress Notes (Signed)
RE: [name]  Date of Birth: 1932/09/18  Date: 02/07/2022  To Whom It May Concern:  Please be advised that the above-named patient will require a short-term nursing home stay - anticipated 30 days or less for rehabilitation and strengthening. The plan is for return home.

## 2022-02-07 NOTE — Progress Notes (Signed)
PROGRESS NOTE    [name]  HWE:[id number] DOB: 06-01-1932 DOA: 02/03/2022 PCP: Antony Contras, MD  89/F with history of DM2, hypertension, AAA, PVD, TIA, cerebral aneurysm status post coiling, GERD, duodenitis, IBS, anxiety.  Admitted last month in early December for new onset A-fib with RVR and was started on Coreg and Eliquis.  -Presented to the ED 1/4 with nausea vomiting and diarrhea -In the ED she was noted to be hypertensive, blood pressure 220, mildly hypoxic requiring 2 L O2.  Labs were unremarkable except for mildly elevated high-sensitivity troponin, flu and COVID PCR were negative, CT abdomen pelvis negative for acute findings, chest x-ray noted pulmonary vascular congestion and possible mild interstitial edema. -Complicated by A-fib RVR -gastroenteritis improving, nausea persists  Subjective: -Continues to have nausea, remains in A-fib, no diarrhea, abdominal pain or vomiting  Assessment and Plan   Nausea/vomiting/diarrhea -Suspect viral gastroenteritis, flu and COVID PCR negative, CT abdomen pelvis without acute findings -Continue supportive care, no further diarrhea or vomiting -Diet as tolerated, unfortunately nausea persists -Increase activity, PT eval, patient declines rehab  Atrial fibrillation with RVR -Recent echo with preserved EF, required Cardizem drip on admission -Back in A-fib with some RVR early yesterday, now back on oral Cardizem -I wonder if A-fib is contributing to her persistent nausea, will request cardiology input, may benefit from cardioversion -continue Coreg and Eliquis  Acute on chronic diastolic CHF Mild hypoxia -Likely triggered by A-fib, has been taking Lasix 3 times a week, chest x-ray with pulmonary vascular congestion and mild edema -Treated with 2 doses of IV Lasix, appears close to euvolemic, hold further diuretics today -Last echo 12/23 with EF 55-60%, grade 1 DD -Incentive spirometry, weaned off O2   elevated troponin -Likely  secondary to demand in the setting of CHF, A-fib -Do not suspect ACS   Hypertensive urgency -Systolic as high as 893, now improved  -Continue Coreg, diuretics as above   Insulin-dependent diabetes -A1c 6.2 on 01/02/2022 -Sensitive sliding scale insulin ACHS -Continue current dose of Lantus  DVT prophylaxis: Eliquis Code Status: DNR Family Communication: None present at bedside, called and updated stepdaughter Debbie Disposition Plan: Home in 1 to 2 days  Consultants:    Procedures:   Antimicrobials:    Objective: Vitals:   02/07/22 0300 02/07/22 0400 02/07/22 0625 02/07/22 0833  BP: 139/77 124/82 125/69 (!) 158/72  Pulse: (!) 55 88  97  Resp:  20  19  Temp:  98.2 F (36.8 C)  98 F (36.7 C)  TempSrc:  Oral  Oral  SpO2: 95% 95%  91%  Weight:  65.2 kg    Height:        Intake/Output Summary (Last 24 hours) at 02/07/2022 1205 Last data filed at 02/06/2022 2000 Gross per 24 hour  Intake --  Output 800 ml  Net -800 ml   Filed Weights   02/05/22 0328 02/06/22 0517 02/07/22 0400  Weight: 66.4 kg 65.8 kg 65.2 kg    Examination:  General exam: Pleasant elderly chronically ill female laying in bed, AAOx3 HEENT: No JVD CVS: S1-S2, regular rhythm Lungs: Decreased breath sounds at the bases Abdomen: Soft, nontender, bowel sounds present Extremities: Trace edema  Skin: No rashes Psychiatry:  Mood & affect appropriate.     Data Reviewed:   CBC: Recent Labs  Lab 02/03/22 1420 02/05/22 0228 02/07/22 0157  WBC 6.5 10.9* 9.4  NEUTROABS 3.6  --   --   HGB 11.7* 11.1* 11.8*  HCT 36.1 35.0* 37.3  MCV  75.5* 75.9* 76.7*  PLT 188 204 185   Basic Metabolic Panel: Recent Labs  Lab 02/03/22 1420 02/04/22 0459 02/05/22 0228 02/06/22 0136 02/07/22 0157  NA 138 142 141 139 136  K 3.8 3.3* 4.5 4.7 4.0  CL 102 104 108 101 103  CO2 '25 24 26 28 26  '$ GLUCOSE 130* 93 112* 93 99  BUN '14 12 20 '$ 27* 26*  CREATININE 0.83 0.83 1.11* 0.91 0.94  CALCIUM 8.8* 8.6* 9.0 8.9  8.7*   GFR: Estimated Creatinine Clearance: 38.3 mL/min (by C-G formula based on SCr of 0.94 mg/dL). Liver Function Tests: Recent Labs  Lab 02/03/22 1420 02/05/22 0228  AST 49* 53*  ALT 51* 44  ALKPHOS 56 51  BILITOT 0.7 1.0  PROT 6.4* 6.1*  ALBUMIN 3.7 3.4*   Recent Labs  Lab 02/03/22 1420  LIPASE 23   No results for input(s): "AMMONIA" in the last 168 hours. Coagulation Profile: No results for input(s): "INR", "PROTIME" in the last 168 hours. Cardiac Enzymes: No results for input(s): "CKTOTAL", "CKMB", "CKMBINDEX", "TROPONINI" in the last 168 hours. BNP (last 3 results) No results for input(s): "PROBNP" in the last 8760 hours. HbA1C: No results for input(s): "HGBA1C" in the last 72 hours. CBG: Recent Labs  Lab 02/06/22 0820 02/06/22 1115 02/06/22 1743 02/06/22 2118 02/07/22 0832  GLUCAP 150* 228* 125* 249* 146*   Lipid Profile: No results for input(s): "CHOL", "HDL", "LDLCALC", "TRIG", "CHOLHDL", "LDLDIRECT" in the last 72 hours. Thyroid Function Tests: No results for input(s): "TSH", "T4TOTAL", "FREET4", "T3FREE", "THYROIDAB" in the last 72 hours. Anemia Panel: No results for input(s): "VITAMINB12", "FOLATE", "FERRITIN", "TIBC", "IRON", "RETICCTPCT" in the last 72 hours. Urine analysis:    Component Value Date/Time   COLORURINE YELLOW 02/03/2022 2122   APPEARANCEUR CLEAR 02/03/2022 2122   LABSPEC 1.035 (H) 02/03/2022 2122   PHURINE 5.0 02/03/2022 2122   GLUCOSEU NEGATIVE 02/03/2022 2122   HGBUR MODERATE (A) 02/03/2022 2122   BILIRUBINUR NEGATIVE 02/03/2022 2122   KETONESUR 20 (A) 02/03/2022 2122   PROTEINUR 100 (A) 02/03/2022 2122   UROBILINOGEN 0.2 04/01/2014 1627   NITRITE NEGATIVE 02/03/2022 2122   LEUKOCYTESUR TRACE (A) 02/03/2022 2122   Sepsis Labs: '@LABRCNTIP'$ (procalcitonin:4,lacticidven:4)  ) Recent Results (from the past 240 hour(s))  Urine Culture     Status: Abnormal   Collection Time: 02/03/22  4:02 PM   Specimen: Urine, Clean Catch   Result Value Ref Range Status   Specimen Description URINE, CLEAN CATCH  Final   Special Requests   Final    NONE Performed at [facility], [place] [address], [place], [place] [address]    Culture MULTIPLE SPECIES PRESENT, SUGGEST RECOLLECTION (A)  Final   Report Status 02/04/2022 FINAL  Final  Resp panel by RT-PCR (RSV, Flu A&B, Covid) Anterior Nasal Swab     Status: None   Collection Time: 02/03/22  8:05 PM   Specimen: Anterior Nasal Swab  Result Value Ref Range Status   SARS Coronavirus 2 by RT PCR NEGATIVE NEGATIVE Final    Comment: (NOTE) SARS-CoV-2 target nucleic acids are NOT DETECTED.  The SARS-CoV-2 RNA is generally detectable in upper respiratory specimens during the acute phase of infection. The lowest concentration of SARS-CoV-2 viral copies this assay can detect is 138 copies/mL. A negative result does not preclude SARS-Cov-2 infection and should not be used as the sole basis for treatment or other patient management decisions. A negative result may occur with  improper specimen collection/handling, submission of specimen other than  nasopharyngeal swab, presence of viral mutation(s) within the areas targeted by this assay, and inadequate number of viral copies(<138 copies/mL). A negative result must be combined with clinical observations, patient history, and epidemiological information. The expected result is Negative.  Fact Sheet for Patients:  EntrepreneurPulse.com.au  Fact Sheet for Healthcare Providers:  IncredibleEmployment.be  This test is no t yet approved or cleared by the [place] FDA and  has been authorized for detection and/or diagnosis of SARS-CoV-2 by FDA under an Emergency Use Authorization (EUA). This EUA will remain  in effect (meaning this test can be used) for the duration of the COVID-19 declaration under Section 564(b)(1) of the Act, 21 U.S.C.section 360bbb-3(b)(1), unless the authorization is  terminated  or revoked sooner.       Influenza A by PCR NEGATIVE NEGATIVE Final   Influenza B by PCR NEGATIVE NEGATIVE Final    Comment: (NOTE) The Xpert Xpress SARS-CoV-2/FLU/RSV plus assay is intended as an aid in the diagnosis of influenza from Nasopharyngeal swab specimens and should not be used as a sole basis for treatment. Nasal washings and aspirates are unacceptable for Xpert Xpress SARS-CoV-2/FLU/RSV testing.  Fact Sheet for Patients: EntrepreneurPulse.com.au  Fact Sheet for Healthcare Providers: IncredibleEmployment.be  This test is not yet approved or cleared by the [place] FDA and has been authorized for detection and/or diagnosis of SARS-CoV-2 by FDA under an Emergency Use Authorization (EUA). This EUA will remain in effect (meaning this test can be used) for the duration of the COVID-19 declaration under Section 564(b)(1) of the Act, 21 U.S.C. section 360bbb-3(b)(1), unless the authorization is terminated or revoked.     Resp Syncytial Virus by PCR NEGATIVE NEGATIVE Final    Comment: (NOTE) Fact Sheet for Patients: EntrepreneurPulse.com.au  Fact Sheet for Healthcare Providers: IncredibleEmployment.be  This test is not yet approved or cleared by the [place] FDA and has been authorized for detection and/or diagnosis of SARS-CoV-2 by FDA under an Emergency Use Authorization (EUA). This EUA will remain in effect (meaning this test can be used) for the duration of the COVID-19 declaration under Section 564(b)(1) of the Act, 21 U.S.C. section 360bbb-3(b)(1), unless the authorization is terminated or revoked.  Performed at [facility], [place] [address], [place], [place] [address]      Radiology Studies: No results found.   Scheduled Meds:  apixaban  5 mg Oral BID   insulin aspart  0-5 Units Subcutaneous QHS   insulin aspart  0-9 Units Subcutaneous TID WC    metoprolol tartrate  50 mg Oral BID   Continuous Infusions:      LOS: 4 days    Time spent: 47mn    PDomenic Polite MD [facility]   02/07/2022, 12:05 PM

## 2022-02-07 NOTE — Evaluation (Signed)
Occupational Therapy Evaluation Patient Details Name: [name] MRN: [id number] DOB: August 25, 1932 Today's Date: 02/07/2022   History of Present Illness Pt is an 89 y.o. female admitted 02/03/21 with nausea, vomiting, diarrhea; suspect viral gastroenteritis. Pt also with HTN, afib with RVR. Recent admission 12/2021 for new onset afib with RVR. PMH includes DM2, HTN, AAA, PVD, TIA, cerebral aneurysm s/p coiling, IBS, duodenitis, anxiety.   Clinical Impression   [name] was evaluated s/p the above admission list, she is generally mod I at baseline and lives with her husband who has dementia, her family assists with IADls. Upon evaluation pt demonstrated functional limitations due to fatigue, poor activity tolerance, generalized weakness and dizziness. Overall pt required min A for LB Adls and set up for ADLs in sitting. She completed all transfers and short mobility with RW and min G. VSS throughout positional changes, however pt did report dizziness and increased fatigue with activity and declined further mobility/ADLs. OT to continue to follow acutely. Recommend d/c to SNF fro continued rehab unless pt is able to progress to mod I level for mobility and ADLs acutely.    Recommendations for follow up therapy are one component of a multi-disciplinary discharge planning process, led by the attending physician.  Recommendations may be updated based on patient status, additional functional criteria and insurance authorization.   Follow Up Recommendations  Skilled nursing-short term rehab (<3 hours/day) (pending acute progression)     Assistance Recommended at Discharge Frequent or constant Supervision/Assistance  Patient can return home with the following A little help with walking and/or transfers;A little help with bathing/dressing/bathroom;Assistance with cooking/housework;Assist for transportation;Help with stairs or ramp for entrance    Functional Status Assessment  Patient has had a recent  decline in their functional status and demonstrates the ability to make significant improvements in function in a reasonable and predictable amount of time.  Equipment Recommendations  None recommended by OT       Precautions / Restrictions Precautions Precautions: Fall;Other (comment) Precaution Comments: Watch BP Restrictions Weight Bearing Restrictions: No      Mobility Bed Mobility Overal bed mobility: Needs Assistance Bed Mobility: Supine to Sit, Sit to Supine     Supine to sit: Modified independent (Device/Increase time) Sit to supine: Modified independent (Device/Increase time)        Transfers Overall transfer level: Needs assistance Equipment used: Rolling walker (2 wheels) Transfers: Sit to/from Stand Sit to Stand: Min guard                  Balance Overall balance assessment: Needs assistance Sitting-balance support: No upper extremity supported, Feet supported Sitting balance-Leahy Scale: Fair     Standing balance support: Single extremity supported, During functional activity Standing balance-Leahy Scale: Fair Standing balance comment: able to statically stand with only 1UE supported           ADL either performed or assessed with clinical judgement   ADL Overall ADL's : Needs assistance/impaired Eating/Feeding: Independent;Sitting   Grooming: Set up;Sitting   Upper Body Bathing: Set up;Sitting   Lower Body Bathing: Minimal assistance;Sit to/from stand   Upper Body Dressing : Set up;Sitting   Lower Body Dressing: Minimal assistance;Sit to/from stand   Toilet Transfer: Min guard;Ambulation;Rolling walker (2 wheels)   Toileting- Clothing Manipulation and Hygiene: Supervision/safety;Sitting/lateral lean       Functional mobility during ADLs: Min guard;Rolling walker (2 wheels) General ADL Comments: Pt with report of fatigue and dizziness, VSS throughout positional changes     Vision Baseline Vision/History: 0  No visual  deficits Vision Assessment?: No apparent visual deficits     Perception Perception Perception Tested?: No   Praxis Praxis Praxis tested?: Not tested    Pertinent Vitals/Pain Pain Assessment Pain Assessment: Faces Faces Pain Scale: Hurts a little bit Pain Location: generalized Pain Descriptors / Indicators: Tiring, Grimacing Pain Intervention(s): Monitored during session     Hand Dominance Right   Extremity/Trunk Assessment Upper Extremity Assessment Upper Extremity Assessment: Generalized weakness   Lower Extremity Assessment Lower Extremity Assessment: Generalized weakness   Cervical / Trunk Assessment Cervical / Trunk Assessment: Normal   Communication Communication Communication: No difficulties   Cognition Arousal/Alertness: Awake/alert Behavior During Therapy: WFL for tasks assessed/performed Overall Cognitive Status: Within Functional Limits for tasks assessed                   General Comments: WFL  for simple tasks assessed, would benefit from higher level assessment     General Comments  VSS on RA, negative OH            Home Living Family/patient expects to be discharged to:: Private residence Living Arrangements: Spouse/significant other Available Help at Discharge: Family;Personal care attendant Type of Home: House Home Access: Level entry     Home Layout: One level     Bathroom Shower/Tub: Teacher, early years/pre: Standard     Home Equipment: Conservation officer, nature (2 wheels);Cane - single point;Tub bench;Grab bars - tub/shower   Additional Comments: pt lives with husband who has dementia; he has PCA 5x/wk, but pt cares for him overnight and weekends. husband has family helping him while she is admitted, but they work and cannot provide increased assist consistently      Prior Functioning/Environment Prior Level of Function : Independent/Modified Independent             Mobility Comments: mod indep with SPC for  household ambulation, uses walker in community ADLs Comments: independent with ADLs, sponge baths; children help with iADLs        OT Problem List: Decreased strength;Decreased range of motion;Decreased activity tolerance;Impaired balance (sitting and/or standing);Decreased safety awareness;Decreased knowledge of use of DME or AE;Decreased knowledge of precautions;Cardiopulmonary status limiting activity      OT Treatment/Interventions: Self-care/ADL training;Therapeutic exercise;DME and/or AE instruction;Therapeutic activities;Patient/family education;Balance training    OT Goals(Current goals can be found in the care plan section) Acute Rehab OT Goals Patient Stated Goal: to get some rest OT Goal Formulation: With patient Time For Goal Achievement: 02/21/22 Potential to Achieve Goals: Good ADL Goals Pt Will Perform Lower Body Bathing: sit to/from stand;with supervision Pt Will Perform Lower Body Dressing: sit to/from stand;with supervision Pt Will Transfer to Toilet: with supervision;ambulating;regular height toilet Additional ADL Goal #1: pt will indep verbalize at least 3 energy conservation strategies to increase safety at d/c  OT Frequency: Min 2X/week       AM-PAC OT "6 Clicks" Daily Activity     Outcome Measure Help from another person eating meals?: None Help from another person taking care of personal grooming?: A Little Help from another person toileting, which includes using toliet, bedpan, or urinal?: A Little Help from another person bathing (including washing, rinsing, drying)?: A Little Help from another person to put on and taking off regular upper body clothing?: None Help from another person to put on and taking off regular lower body clothing?: A Little 6 Click Score: 20   End of Session Equipment Utilized During Treatment: Gait belt;Rolling walker (2 wheels) Nurse Communication: Mobility  status  Activity Tolerance: Patient tolerated treatment well Patient  left: in bed;with call bell/phone within reach;with bed alarm set  OT Visit Diagnosis: Unsteadiness on feet (R26.81);Muscle weakness (generalized) (M62.81);Pain                Time: 1959-7471 OT Time Calculation (min): 15 min Charges:  OT General Charges $OT Visit: 1 Visit OT Evaluation $OT Eval Moderate Complexity: 1 Mod   Jaylaa Gallion D Causey 02/07/2022, 9:57 AM

## 2022-02-07 NOTE — Care Management Important Message (Signed)
Important Message  Patient Details  Name: [name] MRN: [id number] Date of Birth: 12/31/1932   Medicare Important Message Given:  Yes     Shelda Altes 02/07/2022, 8:12 AM

## 2022-02-07 NOTE — Progress Notes (Signed)
Physical Therapy Treatment Patient Details Name: [name] MRN: [id number] DOB: 02/24/1932 Today's Date: 02/07/2022   History of Present Illness Pt is an 89 y.o. female admitted 02/03/21 with nausea, vomiting, diarrhea; suspect viral gastroenteritis. Pt also with HTN, afib with RVR. Recent admission 12/2021 for new onset afib with RVR. PMH includes DM2, HTN, AAA, PVD, TIA, cerebral aneurysm s/p coiling, IBS, duodenitis, anxiety.    PT Comments    Patient agreeable to PT session.  She required physical assistance today to stand with standing tolerance of ~ 30 seconds. Unable to progress to walking due to dizziness and poor activity tolerance. Recommend to continue PT to maximize independence and decrease caregiver burden. Patient is hopeful to return home at discharge with increased caregiver support. She could benefit from SNF placement for short term rehab if caregiver support is unavailable.    Recommendations for follow up therapy are one component of a multi-disciplinary discharge planning process, led by the attending physician.  Recommendations may be updated based on patient status, additional functional criteria and insurance authorization.  Follow Up Recommendations  Skilled nursing-short term rehab (<3 hours/day) Can patient physically be transported by private vehicle: Yes   Assistance Recommended at Discharge Frequent or constant Supervision/Assistance  Patient can return home with the following A little help with walking and/or transfers;A little help with bathing/dressing/bathroom;Assistance with cooking/housework;Assist for transportation;Help with stairs or ramp for entrance   Equipment Recommendations   (to be determined)    Recommendations for Other Services       Precautions / Restrictions Precautions Precautions: Fall Precaution Comments: monitor BP Restrictions Weight Bearing Restrictions: No     Mobility  Bed Mobility Overal bed mobility: Needs  Assistance Bed Mobility: Supine to Sit, Sit to Supine     Supine to sit: Min guard Sit to supine: Min guard   General bed mobility comments: increased time and effort required. patient reports dizziness with sitting upright that improved with increased sitting time    Transfers Overall transfer level: Needs assistance Equipment used: Rolling walker (2 wheels) Transfers: Sit to/from Stand Sit to Stand: Min assist           General transfer comment: lifting assistance required for standing. unable to progress ambulation due to dizziness reported with standing. unable to get standing blood pressure due to limited standing tolerance. cues for safety with transfers    Ambulation/Gait                   Stairs             Wheelchair Mobility    Modified Rankin (Stroke Patients Only)       Balance Overall balance assessment: Needs assistance Sitting-balance support: No upper extremity supported, Feet supported Sitting balance-Leahy Scale: Fair     Standing balance support: Bilateral upper extremity supported, Reliant on assistive device for balance Standing balance-Leahy Scale: Poor Standing balance comment: patient required rolling walker for support to stand with no external support required from therapist. standing tolerance of around 30 seconds                            Cognition Arousal/Alertness: Awake/alert Behavior During Therapy: WFL for tasks assessed/performed Overall Cognitive Status: Within Functional Limits for tasks assessed  Exercises      General Comments General comments (skin integrity, edema, etc.): difficulty with getting blood pressure readings during session. resting blood pressure was 147/76 with heart rate of 104      Pertinent Vitals/Pain Pain Assessment Pain Assessment: No/denies pain    Home Living                          Prior Function             PT Goals (current goals can now be found in the care plan section) Acute Rehab PT Goals Patient Stated Goal: to go home PT Goal Formulation: With patient Time For Goal Achievement: 02/20/22 Potential to Achieve Goals: Good Progress towards PT goals: Progressing toward goals    Frequency    Min 3X/week      PT Plan Current plan remains appropriate    Co-evaluation              AM-PAC PT "6 Clicks" Mobility   Outcome Measure  Help needed turning from your back to your side while in a flat bed without using bedrails?: A Little Help needed moving from lying on your back to sitting on the side of a flat bed without using bedrails?: A Little Help needed moving to and from a bed to a chair (including a wheelchair)?: A Little Help needed standing up from a chair using your arms (e.g., wheelchair or bedside chair)?: A Little Help needed to walk in hospital room?: Total Help needed climbing 3-5 steps with a railing? : A Lot 6 Click Score: 15    End of Session   Activity Tolerance: Patient limited by fatigue Patient left: in bed;with call bell/phone within reach;with bed alarm set   PT Visit Diagnosis: Other abnormalities of gait and mobility (R26.89);Muscle weakness (generalized) (M62.81)     Time: 7915-0569 PT Time Calculation (min) (ACUTE ONLY): 17 min  Charges:  $Therapeutic Activity: 8-22 mins                     Minna Merritts, PT, MPT    Percell Locus 02/07/2022, 1:42 PM

## 2022-02-07 NOTE — Consult Note (Addendum)
Cardiology Consultation   Patient ID: [name] MRN: [id number]; DOB: [birth year]  Admit date: 02/03/2022 Date of Consult: 02/07/2022  PCP:  Antony Contras, MD   [place] [facility] Cardiologist:  Fransico Him, MD     Patient Profile:   [name] is a 89 y.o. female with a hx of hypertension, diabetes, peptic ulcer disease, carotid artery disease, HFpEF, ascending thoracic aortic aneurysm, MVP, remote cerebral aneurysm status post coiling, colon CA status post resection, paroxysmal atrial fibrillation who is being seen 02/07/2022 for the evaluation of A-fib RVR at the request of Dr. Broadus John.  History of Present Illness:   [name] is an 89 year old female with past medical history noted above.  She follows with Dr. Radford Pax as an outpatient and has chronic chest pain and shortness of breath.  Remote cardiac catheterization 2002 showed normal coronary arteries.  Myoview 05/2015 showed normal EF, low risk study, no ischemia.  Cardiac event monitor 07/2017 with sinus rhythm and occasional PVCs. Echocardiogram 07/2021 with LVEF of 60 to 65%, mild LVH, normal RV, trivial MR/AR, borderline aortic root dilatation 37 mm, moderate ascending aorta rotation of 41 mm.  Seen in the office 07/2021 for follow-up and continued to have chronic chest pain without change in quality.  She was continued on Lasix 20 mg daily with additional dosing as needed.  Continued on spironolactone 12.5 mg daily, Entresto was felt to not be ideal and SGLT2 to be considered in the future.  Her antihypertensive regimen included Lasix 20 mg daily, hydralazine 10 mg 3 times daily, spironolactone 12.5 mg daily.   She was hospitalized 12/2021 with new onset atrial fibrillation with RVR.  Was given IV diltiazem and converted to sinus rhythm as well as started on Eliquis for anticoagulation.  Echocardiogram during that admission showed LVEF of 55 to 11%, grade 1 diastolic dysfunction, normal RV size and function, basal  inferior hypokinesis/akinesis with mildly dilated left atrial size.  Of note she did have an elevated troponin which peaked at 3162 which was felt to possibly be demand ischemia in the setting of A-fib RVR.  Option of cardiac catheterization was discussed with the patient but she was not willing to proceed and wished to consider conservative management as she was pain-free.  There was recommendation to consider noninvasive ischemic outpatient workup to be discussed at her follow-up visit.  She was discharged on Eliquis 5 mg twice daily, carvedilol 6.25 mg twice daily, Zetia 10 mg daily, Lasix Monday Wednesday Friday, hydralazine 25 mg 3 times daily, spironolactone 12.5 mg daily and trandolapril '4mg'$  BID.   Of note patient called back into the office 12/26 with reports that her heart rates had been in the 40s.  She was advised to stop carvedilol.  Plan was to have her wear a 3-day cardiac monitor while off the carvedilol.  Presented back to the ED on 1/4 with complaints of nausea vomiting and diarrhea for several days prior to admission. Has been complaint with home medications.   Labs on admission showed sodium 138, potassium 3.8, creatinine 0.8, AST 49, ALT 51, BNP 970, high-sensitivity troponin 163>> 147, WBC 6.5, hemoglobin 11.7, D-dimer 0.56.  CT abdomen pelvis unremarkable.  EKG showed sinus rhythm, 79 bpm, PACs.  Respiratory panel negative.  She was admitted to internal medicine for further management.  Developed atrial fibrillation with RVR 1/5 and placed on Cardizem drip.  Has been in and out of afib intermittently during admission. Cardiology asked to evaluate.   In talking with  her, she lives at home with her husband. Has a home health aide.   Past Medical History:  Diagnosis Date   Abdominal pain, chronic, left lower quadrant    Acute torn meniscus of knee    Adenomatous colon polyp 1970   carcinoma in situ   Allergic rhinitis    Amaurosis fugax 08/07/2012   Anxiety    Ascending aortic  aneurysm ([place]) 12/07/2015   16m by chest CTA 08/2020   Benign essential tremor syndrome    Bicuspid aortic valve    no AS on 07/2019   Carotid stenosis    1039 bilateral by dopplers 03/2017.    colon ca dx'd 1970   surg only   DDD (degenerative disc disease)    Diverticulitis    Diverticulosis    [place]    Duodenitis    peptic, with gastric heterotopia   Endometriosis    s/p hysterectomy   Fundic gland polyposis of stomach    GERD (gastroesophageal reflux disease)    Heart murmur    Hiatal hernia 02/03/2005   History of cerebral aneurysm repair    s/p coiling   History of hemorrhoids    with bleeding   History of shingles    HLD (hyperlipidemia)    HTN (hypertension)    Hypercholesteremia    IBS (irritable bowel syndrome)    Iron deficiency    Lung nodule 07/12/2021   CT 05/2021: 3 mm right solid pulmonary nodule-no routine follow-up imaging recommended   Migraine    MVP (mitral valve prolapse)    Osteopenia    Peripheral neuropathy    Toe fracture, right    second toe   UTI (lower urinary tract infection)    Varicose vein     Past Surgical History:  Procedure Laterality Date   ABDOMINAL HYSTERECTOMY     APPENDECTOMY     CARPAL TUNNEL RELEASE  08/26/07   CATARACT EXTRACTION     CEREBRAL ANEURYSM REPAIR     COLON RESECTION     COLONOSCOPY  06/07/08   divertiulosis, internal hemorrhoids   COLONOSCOPY W/ BIOPSIES AND POLYPECTOMY  02/03/05   diverticulosis, 4 mm sessile polyps, internal and external hemorrhoids   CORONARY ANGIOPLASTY WITH STENT PLACEMENT     ESOPHAGOGASTRODUODENOSCOPY  02/03/05   hiatal hernia, 6 benign gastric polyps   FLEXIBLE SIGMOIDOSCOPY     HAND SURGERY Right    INTRAOCULAR LENS INSERTION     right hand decompressive fasciotomy  08/26/07   , dorsal and volar   TONSILLECTOMY       Home Medications:  Prior to Admission medications   Medication Sig Start Date End Date Taking? Authorizing Provider  acetaminophen (TYLENOL)  500 MG tablet Take 500 mg by mouth every 6 (six) hours as needed for mild pain, moderate pain, fever or headache.   Yes [provider]  ALPRAZolam (Duanne Moron 0.5 MG tablet Take 0.5-1 mg by mouth 2 (two) times daily as needed for anxiety.   Yes [provider]  furosemide (LASIX) 20 MG tablet TAKE ONE TAB EVERY MON, WED, FRI. TAKE ONE ADDITIONAL TAB IF WEIGHT GAIN OF 3+ LBS OVERNIGHT Patient taking differently: Take 20 mg by mouth See admin instructions. 20 mg every morning on Monday, Wednesday, Friday. May take an additional 20 mg daily as needed for a weight gain 3+ lbs overnight. 11/02/21  Yes Turner, TEber Hong MD  hydrALAZINE (APRESOLINE) 10 MG tablet Take 1 tablet (10 mg total) by mouth 3 (three) times  daily. 12/29/21  Yes Turner, Eber Hong, MD  hydrocortisone (ANUSOL-HC) 2.5 % rectal cream Apply to perianal area 3 times daily. Patient taking differently: Place 1 Application rectally 4 (four) times daily as needed for hemorrhoids. Apply to perianal area 3 times daily. 11/14/17  Yes Zehr, Laban Emperor, PA-C  insulin glargine (LANTUS) 100 UNIT/ML Solostar Pen Inject 20 Units into the skin in the morning.   Yes [provider]  Multiple Vitamin (MULTIVITAMIN) tablet Take 1 tablet by mouth in the morning.   Yes [provider]  pantoprazole (PROTONIX) 40 MG tablet TAKE 1 TABLET BY MOUTH EVERY DAY BEFORE BREAKFAST Patient taking differently: Take 40 mg by mouth daily before breakfast. 02/03/20  Yes Gatha Mayer, MD  spironolactone (ALDACTONE) 25 MG tablet TAKE 1/2 TABLET BY MOUTH EVERY DAY Patient taking differently: Take 12.5 mg by mouth in the morning. 12/29/21  Yes Turner, Traci R, MD  trandolapril (MAVIK) 4 MG tablet TAKE 1 TABLET BY MOUTH 2 TIMES DAILY. 08/16/21  Yes Turner, Eber Hong, MD  apixaban (ELIQUIS) 5 MG TABS tablet Take 1 tablet (5 mg total) by mouth 2 (two) times daily. 01/03/22   Danford, Suann Larry, MD  carvedilol (COREG) 6.25 MG tablet Take 1 tablet (6.25  mg total) by mouth 2 (two) times daily with a meal. Patient not taking: Reported on 02/04/2022 01/03/22   Edwin Dada, MD  ezetimibe (ZETIA) 10 MG tablet Take 1 tablet (10 mg total) by mouth daily. Patient not taking: Reported on 02/04/2022 01/04/22   Edwin Dada, MD    Inpatient Medications: Scheduled Meds:  apixaban  5 mg Oral BID   carvedilol  6.25 mg Oral BID WC   diltiazem  60 mg Oral Q6H   insulin aspart  0-5 Units Subcutaneous QHS   insulin aspart  0-9 Units Subcutaneous TID WC   Continuous Infusions:  promethazine (PHENERGAN) injection (IM or IVPB)     PRN Meds: acetaminophen **OR** acetaminophen, hydrALAZINE, ondansetron (ZOFRAN) IV  Allergies:    Allergies  Allergen Reactions   Actos [Pioglitazone] Other (See Comments)    Chronic pedal edema   Amoxil [Amoxicillin] Other (See Comments)    Stomach upset   Avandia [Rosiglitazone] Other (See Comments)    Chronic pedal edema   Fosamax [Alendronate Sodium] Other (See Comments)    Unknown reaction   Ms Contin [Morphine] Other (See Comments)    Body spasms    Ultram [Tramadol] Shortness Of Breath and Palpitations   Amaryl [Glimepiride] Other (See Comments)    Unknown reaction   Januvia [Sitagliptin] Other (See Comments)    Unknown reaction   Lipitor [Atorvastatin] Swelling    Myalgias    Metformin And Related Other (See Comments)    Gastritis    Prandin [Repaglinide] Other (See Comments)    Abdominal pain   Tradjenta [Linagliptin] Other (See Comments)    GI issues   Zoloft [Sertraline Hcl] Other (See Comments)    Altered mental state   Asa [Aspirin] Other (See Comments)    Bleeding ulcers    Bactrim [Sulfamethoxazole-Trimethoprim] Hives   Bentyl [Dicyclomine Hcl] Other (See Comments)    Near syncope Weakness    Cipro [Ciprofloxacin Hcl] Nausea And Vomiting and Other (See Comments)    Stomach upset   Hctz [Hydrochlorothiazide] Other (See Comments)    Urinary issues   Keflex [Cephalexin] Other  (See Comments)    Unknown reaction   Morphine And Related Other (See Comments)    Body spasms   Norvasc [  Amlodipine Besylate] Other (See Comments)    Weakness Dizziness    Oxycontin [Oxycodone] Other (See Comments)    Unknown reaction   Sulfa Antibiotics Hives   Penicillins Swelling and Rash    Social History:   Social History   Socioeconomic History   Marital status: Married    Spouse name: Charles   Number of children: 0   Years of education: Not on file   Highest education level: Not on file  Occupational History   Occupation: retired    Fish farm manager: RETIRED  Tobacco Use   Smoking status: Never   Smokeless tobacco: Never  Vaping Use   Vaping Use: Never used  Substance and Sexual Activity   Alcohol use: No   Drug use: No   Sexual activity: Not on file  Other Topics Concern   Not on file  Social History Narrative   Lives with husband    Right handed   Caffeine: 2 c of coffee a day   Social Determinants of Health   Financial Resource Strain: Not on file  Food Insecurity: No Food Insecurity (02/05/2022)   Hunger Vital Sign    Worried About Running Out of Food in the Last Year: Never true    Ran Out of Food in the Last Year: Never true  Transportation Needs: No Transportation Needs (02/05/2022)   PRAPARE - Hydrologist (Medical): No    Lack of Transportation (Non-Medical): No  Physical Activity: Not on file  Stress: Not on file  Social Connections: Not on file  Intimate Partner Violence: Not At Risk (02/05/2022)   Humiliation, Afraid, Rape, and Kick questionnaire    Fear of Current or Ex-Partner: No    Emotionally Abused: No    Physically Abused: No    Sexually Abused: No    Family History:    Family History  Problem Relation Age of Onset   Ovarian cancer Mother    Heart disease Father    Kidney disease Father    Diabetes Paternal Grandmother    Diabetes Brother    Hypertension Brother    Heart disease Brother    Diabetes  Brother    Heart disease Brother    Microcephaly Brother    Diabetes Other    Colon cancer Other      ROS:  Please see the history of present illness.   All other ROS reviewed and negative.     Physical Exam/Data:   Vitals:   02/07/22 0300 02/07/22 0400 02/07/22 0625 02/07/22 0833  BP: 139/77 124/82 125/69 (!) 158/72  Pulse: (!) 55 88  97  Resp:  20  19  Temp:  98.2 F (36.8 C)  98 F (36.7 C)  TempSrc:  Oral  Oral  SpO2: 95% 95%  91%  Weight:  65.2 kg    Height:        Intake/Output Summary (Last 24 hours) at 02/07/2022 0958 Last data filed at 02/06/2022 2000 Gross per 24 hour  Intake 19.99 ml  Output 800 ml  Net -780.01 ml      02/07/2022    4:00 AM 02/06/2022    5:17 AM 02/05/2022    3:28 AM  Last 3 Weights  Weight (lbs) 143 lb 11.8 oz 145 lb 1 oz 146 lb 6.2 oz  Weight (kg) 65.2 kg 65.8 kg 66.4 kg     Body mass index is 25.46 kg/m.  General:  Well nourished, well developed, in no acute distress HEENT: normal Neck: no  JVD Vascular: No carotid bruits; Distal pulses 2+ bilaterally Cardiac:  normal S1, S2; Irreg Irreg; no murmur  Lungs:  clear to auscultation bilaterally, no wheezing, rhonchi or rales  Abd: soft, nontender, no hepatomegaly  Ext: no edema Musculoskeletal:  No deformities, BUE and BLE strength normal and equal Skin: warm and dry  Neuro:  CNs 2-12 intact, no focal abnormalities noted Psych:  Normal affect   EKG:  The EKG was personally reviewed and demonstrates:    Sinus Rhythm, 65 bpm, PACs Atrial fibrillation with RVR, 144 bpm Sinus bradycardia, 55 bpm  Telemetry:  Telemetry was personally reviewed and demonstrates:  atrial fibrillation rates 90-110s  Relevant CV Studies:  Echo: 01/03/2022  IMPRESSIONS     1. Basal inferior hypokinesis/akinesis. Poor acoustic windows Definity  used.     Compared to echo from June 2023 basal inferior changes are more  prominent. Left ventricular ejection fraction, by estimation, is 55 to  60%. The left  ventricle has normal function. Left ventricular diastolic  parameters are consistent with Grade I  diastolic dysfunction (impaired relaxation).   2. Right ventricular systolic function is low normal. The right  ventricular size is normal. There is normal pulmonary artery systolic  pressure.   3. Left atrial size was mildly dilated.   4. Mild mitral valve regurgitation.   5. AV is mildly thickened, calcified There is very mildly restricted  motion of the valve . Peak and mean gradients through the valve are 11 and  5 mm Hg respectively. No signficant change when compared to echo from June  2023. Marland Kitchen The aortic valve is  tricuspid.   6. There is mild dilatation of the ascending aorta, measuring 39 mm.   7. The inferior vena cava is normal in size with greater than 50%  respiratory variability, suggesting right atrial pressure of 3 mmHg.   FINDINGS   Left Ventricle: Basal inferior hypokinesis/akinesis. Poor acoustic  windows Definity used.  Compared to echo from June 2023 basal inferior changes are more prominent.  Left ventricular ejection fraction, by estimation, is 55 to 60%. The left  ventricle has normal function. Definity contrast agent was given IV to  delineate the left ventricular  endocardial borders. The left ventricular internal cavity size was normal  in size. There is no left ventricular hypertrophy. Left ventricular  diastolic parameters are consistent with Grade I diastolic dysfunction  (impaired relaxation).   Right Ventricle: The right ventricular size is normal. Right vetricular  wall thickness was not assessed. Right ventricular systolic function is  low normal. There is normal pulmonary artery systolic pressure. The  tricuspid regurgitant velocity is 2.71  m/s, and with an assumed right atrial pressure of 3 mmHg, the estimated  right ventricular systolic pressure is 17.6 mmHg.   Left Atrium: Left atrial size was mildly dilated.   Right Atrium: Right atrial size  was normal in size.   Pericardium: Trivial pericardial effusion is present.   Mitral Valve: There is mild thickening of the mitral valve leaflet(s).  Mild mitral valve regurgitation.   Tricuspid Valve: The tricuspid valve is normal in structure. Tricuspid  valve regurgitation is trivial.   Aortic Valve: AV is mildly thickened, calcified There is very mildly  restricted motion of the valve . Peak and mean gradients through the valve  are 11 and 5 mm Hg respectively. No signficant change when compared to  echo from June 2023. The aortic valve  is tricuspid. Aortic valve mean gradient measures 5.0 mmHg. Aortic valve  peak gradient measures 11.0 mmHg. Aortic valve area, by VTI measures 2.21  cm.   Pulmonic Valve: The pulmonic valve was normal in structure. Pulmonic valve  regurgitation is trivial.   Aorta: The aortic root is normal in size and structure. There is mild  dilatation of the ascending aorta, measuring 39 mm.   Venous: The inferior vena cava is normal in size with greater than 50%  respiratory variability, suggesting right atrial pressure of 3 mmHg.   IAS/Shunts: No atrial level shunt detected by color flow Doppler.    Laboratory Data:  High Sensitivity Troponin:   Recent Labs  Lab 02/03/22 1420 02/03/22 1600  TROPONINIHS 163* 147*     Chemistry Recent Labs  Lab 02/05/22 0228 02/06/22 0136 02/07/22 0157  NA 141 139 136  K 4.5 4.7 4.0  CL 108 101 103  CO2 '26 28 26  '$ GLUCOSE 112* 93 99  BUN 20 27* 26*  CREATININE 1.11* 0.91 0.94  CALCIUM 9.0 8.9 8.7*  GFRNONAA 48* >60 59*  ANIONGAP '7 10 7    '$ Recent Labs  Lab 02/03/22 1420 02/05/22 0228  PROT 6.4* 6.1*  ALBUMIN 3.7 3.4*  AST 49* 53*  ALT 51* 44  ALKPHOS 56 51  BILITOT 0.7 1.0   Lipids No results for input(s): "CHOL", "TRIG", "HDL", "LABVLDL", "LDLCALC", "CHOLHDL" in the last 168 hours.  Hematology Recent Labs  Lab 02/03/22 1420 02/05/22 0228 02/07/22 0157  WBC 6.5 10.9* 9.4  RBC 4.78 4.61  4.86  HGB 11.7* 11.1* 11.8*  HCT 36.1 35.0* 37.3  MCV 75.5* 75.9* 76.7*  MCH 24.5* 24.1* 24.3*  MCHC 32.4 31.7 31.6  RDW 16.2* 16.5* 16.5*  PLT 188 204 249   Thyroid No results for input(s): "TSH", "FREET4" in the last 168 hours.  BNP Recent Labs  Lab 02/03/22 2253  BNP 970.6*    DDimer  Recent Labs  Lab 02/03/22 2253  DDIMER 0.56*     Radiology/Studies:  DG Chest Portable 1 View  Result Date: 02/03/2022 CLINICAL DATA:  Shortness of breath EXAM: PORTABLE CHEST 1 VIEW COMPARISON:  01/01/2022 FINDINGS: Cardiomegaly. Central vascular congestion. Possible mild interstitial edema. No pleural effusion or focal airspace disease. Aortic atherosclerosis. IMPRESSION: Cardiomegaly with central vascular congestion and possible mild interstitial edema. Electronically Signed   By: Donavan Foil M.D.   On: 02/03/2022 21:02   CT ABDOMEN PELVIS W CONTRAST  Result Date: 02/03/2022 CLINICAL DATA:  Left lower quadrant abdominal pain. EXAM: CT ABDOMEN AND PELVIS WITH CONTRAST TECHNIQUE: Multidetector CT imaging of the abdomen and pelvis was performed using the standard protocol following bolus administration of intravenous contrast. RADIATION DOSE REDUCTION: This exam was performed according to the departmental dose-optimization program which includes automated exposure control, adjustment of the mA and/or kV according to patient size and/or use of iterative reconstruction technique. CONTRAST:  28m OMNIPAQUE IOHEXOL 350 MG/ML SOLN COMPARISON:  Multiple prior CT examinations including 01/02/2022 and older FINDINGS: Lower chest: Mild linear opacity lung bases likely scar or atelectasis. No pleural effusion. The heart is enlarged. Hepatobiliary: Diffuse fatty liver infiltration. No enhancing liver lesion. Patent portal vein. The gallbladder is nondilated. Pancreas: Severe fatty atrophy of the pancreas. Spleen: Spleen is nonenlarged. Adrenals/Urinary Tract: Slight thickening of both adrenal glands is noted,  nonspecific and not significantly [facility]. No enhancing renal mass. No collecting system dilatation. Preserved contour to the urinary bladder. Stomach/Bowel: On this non oral contrast exam, large bowel is of normal course and caliber. Left-sided  colonic diverticula. Scattered stool. Stomach is nondilated. Small bowel is nondilated. No free air or free fluid. Vascular/Lymphatic: Normal caliber aorta and IVC. Minimal atherosclerotic calcifications along the aorta and branch vessels. No developing abnormal lymph node enlargement present in the abdomen and pelvis. Reproductive: Prostate is unremarkable. Other: No abdominal wall hernia or abnormality. No abdominopelvic ascites. Musculoskeletal: Scattered degenerative changes of the spine. There is trace anterolisthesis of L5 on S1. Multilevel disc bulging in the lumbar spine region with some canal narrowing. IMPRESSION: No bowel obstruction, free air or free fluid. Few colonic diverticula. Fatty liver infiltration. Electronically Signed   By: Jill Side M.D.   On: 02/03/2022 17:46     Assessment and Plan:   [name] is a 89 y.o. female with a hx of hypertension, diabetes, peptic ulcer disease, carotid artery disease, HFpEF, ascending thoracic aortic aneurysm, MVP, remote cerebral aneurysm status post coiling, colon CA status post resection, paroxysmal atrial fibrillation who is being seen 02/07/2022 for the evaluation of A-fib RVR at the request of Dr. Broadus John.  Paroxysmal atrial fibrillation -- initially dx 12/2021, converted to SR during that admission. Now presents back with intermittent episodes of afib in the setting of GI illness. She was briefly treated with IV diltiazem. Rates improved into the 90-100 range. I did discuss cardioversion with her, but she does not wish to proceed currently which is reasonable. Also in the setting of acute illness, she may not hold SR. At this time, would focus on rate  control strategy.  -- currently on coreg 6.'25mg'$  BID, diltiazem '60mg'$  q6hr. Given her advanced age and concern of bradycardia as an outpatient, concerned to have on both BB/CCB therapy -- will stop Diltiazem -- switch coreg to metoprolol 50 mg BID -- continue Eliquis  Nausea, vomiting, diarrhea -- Presented with several days of symptoms, respiratory panel negative.  CT abdomen without acute findings -- Management per primary  HFpEF -- BNP 970, has received several doses of IV lasix -- 739 cc UOP noted -- lasix '20mg'$  MWF PTA  Mildly elevated troponin -- hsTn 163>>147, suspect demand ischemia in the setting of acute GI illness -- no chest pain, has been offered ischemic evaluation during last admission, but declined. -- can consider as outpatient   Hypertension  -- blood pressures were reported in the 200s on admission, has had some softer blood pressures at times -- as above, stop Dilt/coreg -- start metoprolol  50 mg BID, would favor addition of ARB if additional BP meds need   Risk Assessment/Risk Scores:   New York Heart Association (NYHA) Functional Class NYHA Class II  CHA2DS2-VASc Score = 7   This indicates a 11.2% annual risk of stroke. The patient's score is based upon: CHF History: 1 HTN History: 1 Diabetes History: 1 Stroke History: 0 Vascular Disease History: 1 Age Score: 2 Gender Score: 1    For questions or updates, please contact [place] Please consult [email or web address] for contact info under    Signed, Reino Bellis, NP  02/07/2022 9:58 AM  Patient seen, examined. Available data reviewed. Agree with findings, assessment, and plan as outlined by Reino Bellis, NP.  The patient is independently interviewed and examined.  She is an alert, oriented, elderly woman in no distress.  HEENT is normal, lungs are clear bilaterally, heart is irregularly irregular with no murmur gallop, abdomen is soft and nontender, extremities have no edema, skin is warm  and dry with no rash.  Telemetry is reviewed and shows sinus rhythm until  about 5 AM yesterday when she went into atrial fibrillation.  Her ventricular rate and sinus rhythm was about 65 bpm and her ventricular rate in atrial fibrillation is in the 80s and 90s.  We discussed her case and considered use of an antiarrhythmic drug like amiodarone.  However, she has had a prominent complaint of nausea recently and it seems like this could complicate matters with respect to potential drug intolerances and worsening of nausea.  It might be best to treat her with a rate control strategy.  Will change her from carvedilol to metoprolol at a higher dose of 50 mg twice daily.  Will discontinue her calcium channel blocker.  She will continue on anticoagulation with apixaban.  Can adjust metoprolol further based on her heart rate response.  I think at her advanced age her rate control strategy may be most appropriate for her.  Will follow with you and recheck on her tomorrow to assess her response to increased metoprolol dose and review her telemetry once again.  Her recent echo is reviewed demonstrating an inferior wall motion abnormality with preserved overall LVEF and no significant valvular disease.  Sherren Mocha, M.D. 02/07/2022 1:48 PM

## 2022-02-07 NOTE — TOC Initial Note (Addendum)
Transition of Care [facility]) - Initial/Assessment Note    Patient Details  Name: [name] MRN: [id number] Date of Birth: January 01, 1933  Transition of [facility]) CM/SW Contact:    Milas Gain, [place] Phone Number: 02/07/2022, 2:04 PM  Clinical Narrative:                  CSW received consult for possible SNF placement at time of discharge. CSW spoke with patient at bedside  regarding PT recommendation of SNF placement at time of discharge.  Patient expressed understanding of PT recommendation and is agreeable to SNF placement at time of discharge. Patient reports PTA she was from home with spouse.Patient gave CSW permission to fax out initial referral near the [facility] area for possible SNF placement. CSW discussed insurance authorization process and will provide Medicare SNF ratings list with accepted SNF bed offers when available.Patient expressed being hopeful for rehab and to feel better soon. No further questions reported at this time. CSW to continue to follow and assist with discharge planning needs.   Patients passr currently pending. CSW submitted requested clinicals to [place] must for review. CSW started insurance authorization for patient [place] ID# [id number]. Once SNF choice received by patient CSW will need to add patients facility choice to patients insurance auth.  Expected Discharge Plan: Skilled Nursing Facility Barriers to Discharge: Continued Medical Work up   Patient Goals and CMS Choice Patient states their goals for this hospitalization and ongoing recovery are:: SNF CMS Medicare.gov Compare Post Acute Care list provided to:: Patient Choice offered to / list presented to : Patient      Expected Discharge Plan and Services In-house Referral: Clinical Social Work     Living arrangements for the past 2 months: Single Family Home                                      Prior Living Arrangements/Services Living arrangements for the past 2 months:  Single Family Home Lives with:: Spouse Patient language and need for interpreter reviewed:: Yes Do you feel safe going back to the place where you live?: No   SNF  Need for Family Participation in Patient Care: Yes (Comment) Care giver support system in place?: Yes (comment)   Criminal Activity/Legal Involvement Pertinent to Current Situation/Hospitalization: No - Comment as needed  Activities of Daily Living Home Assistive Devices/Equipment: Cane (specify quad or straight) ADL Screening (condition at time of admission) Patient's cognitive ability adequate to safely complete daily activities?: Yes Is the patient deaf or have difficulty hearing?: No Does the patient have difficulty seeing, even when wearing glasses/contacts?: No Does the patient have difficulty concentrating, remembering, or making decisions?: No Patient able to express need for assistance with ADLs?: Yes Does the patient have difficulty dressing or bathing?: Yes Independently performs ADLs?: No Communication: Independent Dressing (OT): Independent Grooming: Independent Feeding: Independent Bathing: Independent Toileting: Independent In/Out Bed: Independent with device (comment) Walks in Home: Needs assistance, Independent with device (comment) Does the patient have difficulty walking or climbing stairs?: Yes Weakness of Legs: Both Weakness of Arms/Hands: Both  Permission Sought/Granted Permission sought to share information with : Case Manager, Family Supports, Chartered certified accountant granted to share information with : Yes, Verbal Permission Granted     Permission granted to share info w AGENCY: SNF        Emotional Assessment Appearance:: Appears stated age Attitude/Demeanor/Rapport: Gracious Affect (typically  observed): Calm Orientation: : Oriented to Self, Oriented to Place, Oriented to  Time, Oriented to Situation Alcohol / Substance Use: Not Applicable Psych Involvement: No  (comment)  Admission diagnosis:  Atrial fibrillation ([place]) [I48.91] Hypoxia [R09.02] Generalized weakness [R53.1] Intractable nausea and vomiting [R11.2] Nausea and vomiting [R11.2] Patient Active Problem List   Diagnosis Date Noted   Atrial fibrillation ([place]) 02/04/2022   Acute hypoxemic respiratory failure ([place]) 02/03/2022   Hypertensive urgency 02/03/2022   Paroxysmal atrial fibrillation ([place]) 02/03/2022   Acute on chronic heart failure with preserved ejection fraction ([place]) 02/03/2022   Nausea and vomiting 02/03/2022   Elevated troponin 01/02/2022   Hyperlipidemia 01/02/2022   NSTEMI (non-ST elevated myocardial infarction) ([place]) 01/02/2022   Atrial fibrillation with RVR ([place]) 01/01/2022   Lung nodule 07/12/2021   Weakness 06/30/2021   Ankle edema, bilateral 01/21/2021   Sprain of left rotator cuff capsule 01/21/2021   Cervical radiculopathy due to degenerative joint disease of spine 01/21/2021   Left wrist pain 08/06/2019   Bicuspid aortic valve    Constipation 11/14/2017   Rectal bleeding 11/14/2017   Carotid stenosis    Heart palpitations 03/23/2017   Ascending aortic aneurysm ([place]) 12/07/2015   SOB (shortness of breath) 12/07/2015   Orthostatic hypotension 04/02/2014   Hematochezia 04/02/2014   Chest pain    Near syncope 04/01/2014   DM (diabetes mellitus) ([place]) 04/01/2014   Diverticulosis 04/01/2014   Unruptured cerebral aneurysm 01/08/2014   Multifactorial gait disorder 01/08/2014   Hereditary and idiopathic peripheral neuropathy 01/08/2014   Aneurysm, cerebral, nonruptured 08/07/2012   Amaurosis fugax 08/07/2012   Unspecified hereditary and idiopathic peripheral neuropathy 08/07/2012   Benign essential tremor syndrome    Abnormal x-ray of lumbar spine 08/29/2010   Low back pain 08/24/2010   Anxiety state 03/26/2010   Essential hypertension 03/26/2010   Abdominal pain, bilateral lower quadrant 03/26/2010   GERD 12/22/2008   Diarrhea 12/22/2008   IRON  DEFICIENCY 05/22/2008   Irritable bowel syndrome 04/07/2008   COLONIC POLYPS, ADENOMATOUS, HX OF 04/07/2008   PCP:  Antony Contras, MD Pharmacy:   [id number] [place], [place] - [id number]: [phone]: [address] [place]Phone: [phone]: [phone]    Social Determinants of Health (SDOH) Social History: SDOH Screenings   Food Insecurity: No Food Insecurity (02/05/2022)  Housing: Low Risk  (02/05/2022)  Transportation Needs: No Transportation Needs (02/05/2022)  Utilities: Not At Risk (02/05/2022)  Tobacco Use: Low Risk  (02/03/2022)   SDOH Interventions:     Readmission Risk Interventions     No data to display

## 2022-02-07 NOTE — NC FL2 (Addendum)
[place] MEDICAID FL2 LEVEL OF CARE FORM     IDENTIFICATION  Patient Name: [name] Birthdate: 1932/11/07 Sex: female Admission Date (Current Location): 02/03/2022  [facility] and [place] Number:  Herbalist and Address:  [facility] [place], [place] [address] [place], [place] [address]      Provider Number: [id number]  Attending Physician Name and Address:  Domenic Polite, MD  Relative Name and Phone Number:       Current Level of Care: Hospital Recommended Level of Care: [place] Prior Approval Number:    Date Approved/Denied:   PASRR Number: [id number]  Discharge Plan: SNF    Current Diagnoses: Patient Active Problem List   Diagnosis Date Noted   Atrial fibrillation ([place]) 02/04/2022   Acute hypoxemic respiratory failure ([place]) 02/03/2022   Hypertensive urgency 02/03/2022   Paroxysmal atrial fibrillation ([place]) 02/03/2022   Acute on chronic heart failure with preserved ejection fraction ([place]) 02/03/2022   Nausea and vomiting 02/03/2022   Elevated troponin 01/02/2022   Hyperlipidemia 01/02/2022   NSTEMI (non-ST elevated myocardial infarction) ([place]) 01/02/2022   Atrial fibrillation with RVR ([place]) 01/01/2022   Lung nodule 07/12/2021   Weakness 06/30/2021   Ankle edema, bilateral 01/21/2021   Sprain of left rotator cuff capsule 01/21/2021   Cervical radiculopathy due to degenerative joint disease of spine 01/21/2021   Left wrist pain 08/06/2019   Bicuspid aortic valve    Constipation 11/14/2017   Rectal bleeding 11/14/2017   Carotid stenosis    Heart palpitations 03/23/2017   Ascending aortic aneurysm ([place]) 12/07/2015   SOB (shortness of breath) 12/07/2015   Orthostatic hypotension 04/02/2014   Hematochezia 04/02/2014   Chest pain    Near syncope 04/01/2014   DM (diabetes mellitus) ([place]) 04/01/2014   Diverticulosis 04/01/2014   Unruptured cerebral aneurysm 01/08/2014   Multifactorial gait disorder 01/08/2014    Hereditary and idiopathic peripheral neuropathy 01/08/2014   Aneurysm, cerebral, nonruptured 08/07/2012   Amaurosis fugax 08/07/2012   Unspecified hereditary and idiopathic peripheral neuropathy 08/07/2012   Benign essential tremor syndrome    Abnormal x-ray of lumbar spine 08/29/2010   Low back pain 08/24/2010   Anxiety state 03/26/2010   Essential hypertension 03/26/2010   Abdominal pain, bilateral lower quadrant 03/26/2010   GERD 12/22/2008   Diarrhea 12/22/2008   IRON DEFICIENCY 05/22/2008   Irritable bowel syndrome 04/07/2008   COLONIC POLYPS, ADENOMATOUS, HX OF 04/07/2008    Orientation RESPIRATION BLADDER Height & Weight     Self, Time, Situation, Place  Normal Continent, External catheter (External Urinary Catheter) Weight: 143 lb 11.8 oz (65.2 kg) Height:  '5\' 3"'$  (160 cm)  BEHAVIORAL SYMPTOMS/MOOD NEUROLOGICAL BOWEL NUTRITION STATUS      Continent (WDL) Diet (Please see discharge summary)  AMBULATORY STATUS COMMUNICATION OF NEEDS Skin   Limited Assist Verbally Other (Comment) Bing Plume LDAs)                       Personal Care Assistance Level of Assistance  Bathing, Feeding, Dressing Bathing Assistance: Limited assistance Feeding assistance: Independent Dressing Assistance: Limited assistance     Functional Limitations Info  Sight, Hearing, Speech Sight Info: Adequate (WDL) Hearing Info: Impaired Speech Info: Adequate    SPECIAL CARE FACTORS FREQUENCY  PT (By licensed PT), OT (By licensed OT)     PT Frequency: 5x min weekly OT Frequency: 5x min weekly            Contractures Contractures Info: Not present  Additional Factors Info  Code Status, Allergies, Insulin Sliding Scale Code Status Info: DNR Allergies Info: Actos (pioglitazone,Amoxil (amoxicillin),Avandia (rosiglitazone,Fosamax (alendronate Sodium),Ms Contin (morphine),Ultram (tramadol),Amaryl (glimepiride),Januvia (sitagliptin),Lipitor  (atorvastatin),Metformin And Related,Prandin (repaglinide),Tradjenta (linagliptin),Zoloft (sertraline Hcl),Asa (aspirin),Bactrim (sulfamethoxazole-trimethoprim),Bentyl (dicyclomine Hcl),Cipro (ciprofloxacin Hcl),Hctz (hydrochlorothiazide),Keflex (cephalexin),Morphine And Related,Norvasc (amlodipine Besylate),Oxycontin (oxycodone),Sulfa Antibiotics,Penicillins   Insulin Sliding Scale Info: insulin aspart (novoLOG) injection 0-5 Units daily at bedtime,insulin aspart (novoLOG) injection 0-9 Units 3 times daily with meals       Current Medications (02/07/2022):  This is the current hospital active medication list Current Facility-Administered Medications  Medication Dose Route Frequency Provider Last Rate Last Admin   acetaminophen (TYLENOL) tablet 650 mg  650 mg Oral Q6H PRN Shela Leff, MD   650 mg at 02/06/22 2216   Or   acetaminophen (TYLENOL) suppository 650 mg  650 mg Rectal Q6H PRN Shela Leff, MD       apixaban Arne [place]) tablet 5 mg  5 mg Oral BID Domenic Polite, MD   5 mg at 02/07/22 1019   hydrALAZINE (APRESOLINE) injection 5 mg  5 mg Intravenous Q6H PRN Shela Leff, MD   5 mg at 02/05/22 1616   insulin aspart (novoLOG) injection 0-5 Units  0-5 Units Subcutaneous QHS Shela Leff, MD   2 Units at 02/06/22 2216   insulin aspart (novoLOG) injection 0-9 Units  0-9 Units Subcutaneous TID WC Shela Leff, MD   2 Units at 02/07/22 1240   metoprolol tartrate (LOPRESSOR) tablet 50 mg  50 mg Oral BID Sherren Mocha, MD       ondansetron [facility]) injection 4 mg  4 mg Intravenous Q6H PRN Shela Leff, MD   4 mg at 02/07/22 1610     Discharge Medications: Please see discharge summary for a list of discharge medications.  Relevant Imaging Results:  Relevant Lab Results:   Additional Information [phone]  Milas Gain, LCSWA

## 2022-02-08 DIAGNOSIS — I480 Paroxysmal atrial fibrillation: Secondary | ICD-10-CM | POA: Diagnosis not present

## 2022-02-08 LAB — GLUCOSE, CAPILLARY
Glucose-Capillary: 167 mg/dL — ABNORMAL HIGH (ref 70–99)
Glucose-Capillary: 158 mg/dL — ABNORMAL HIGH (ref 70–99)
Glucose-Capillary: 88 mg/dL (ref 70–99)
Glucose-Capillary: 239 mg/dL — ABNORMAL HIGH (ref 70–99)

## 2022-02-08 MED ORDER — PANTOPRAZOLE SODIUM 40 MG PO TBEC
40.0000 mg | DELAYED_RELEASE_TABLET | Freq: Every day | ORAL | Status: DC
  Administered 2022-02-08 – 2022-02-18 (×11): 40 mg via ORAL
  Filled 2022-02-08 (×11): qty 1

## 2022-02-08 MED ORDER — METOPROLOL TARTRATE 50 MG PO TABS
75.0000 mg | ORAL_TABLET | Freq: Two times a day (BID) | ORAL | Status: DC
  Administered 2022-02-08 (×2): 75 mg via ORAL
  Filled 2022-02-08 (×2): qty 1

## 2022-02-08 MED ORDER — SODIUM CHLORIDE 0.9 % IV SOLN
12.5000 mg | Freq: Once | INTRAVENOUS | Status: AC
  Administered 2022-02-08: 12.5 mg via INTRAVENOUS
  Filled 2022-02-08: qty 12.5

## 2022-02-08 NOTE — Progress Notes (Addendum)
Rounding Note    Patient Name: [name] Date of Encounter: 02/08/2022  [facility] Cardiologist: Fransico Him, MD   Subjective   Still with nausea this morning. Has been eating small amounts.   Inpatient Medications    Scheduled Meds:  apixaban  5 mg Oral BID   insulin aspart  0-5 Units Subcutaneous QHS   insulin aspart  0-9 Units Subcutaneous TID WC   metoprolol tartrate  75 mg Oral BID   Continuous Infusions:  PRN Meds: acetaminophen **OR** acetaminophen, hydrALAZINE, ondansetron (ZOFRAN) IV   Vital Signs    Vitals:   02/07/22 2106 02/07/22 2343 02/08/22 0458 02/08/22 0500  BP: (!) 161/83 (!) 153/84 (!) 152/89   Pulse: 66 (!) 41 86   Resp: '19 18 18   '$ Temp: 99 F (37.2 C) 98.7 F (37.1 C) 98.6 F (37 C)   TempSrc: Oral Oral Oral   SpO2: (!) 77% 90% 98%   Weight:    66.5 kg  Height:       No intake or output data in the 24 hours ending 02/08/22 0817    02/08/2022    5:00 AM 02/07/2022    4:00 AM 02/06/2022    5:17 AM  Last 3 Weights  Weight (lbs) 146 lb 9.7 oz 143 lb 11.8 oz 145 lb 1 oz  Weight (kg) 66.5 kg 65.2 kg 65.8 kg      Telemetry    Afib rates 80-110s - Personally Reviewed  ECG    No new tracing   Physical Exam   GEN: No acute distress.   Neck: No JVD Cardiac: Irreg Irreg, no murmurs, rubs, or gallops.  Respiratory: Diminished in bases GI: Soft, nontender, non-distended  MS: No edema; No deformity. Neuro:  Nonfocal  Psych: Normal affect   Labs    High Sensitivity Troponin:   Recent Labs  Lab 02/03/22 1420 02/03/22 1600  TROPONINIHS 163* 147*     Chemistry Recent Labs  Lab 02/03/22 1420 02/04/22 0459 02/05/22 0228 02/06/22 0136 02/07/22 0157  NA 138   < > 141 139 136  K 3.8   < > 4.5 4.7 4.0  CL 102   < > 108 101 103  CO2 25   < > '26 28 26  '$ GLUCOSE 130*   < > 112* 93 99  BUN 14   < > 20 27* 26*  CREATININE 0.83   < > 1.11* 0.91 0.94  CALCIUM 8.8*   < > 9.0 8.9 8.7*  PROT 6.4*  --  6.1*  --   --    ALBUMIN 3.7  --  3.4*  --   --   AST 49*  --  53*  --   --   ALT 51*  --  44  --   --   ALKPHOS 56  --  51  --   --   BILITOT 0.7  --  1.0  --   --   GFRNONAA >60   < > 48* >60 59*  ANIONGAP 11   < > '7 10 7   '$ < > = values in this interval not displayed.    Lipids No results for input(s): "CHOL", "TRIG", "HDL", "LABVLDL", "LDLCALC", "CHOLHDL" in the last 168 hours.  Hematology Recent Labs  Lab 02/03/22 1420 02/05/22 0228 02/07/22 0157  WBC 6.5 10.9* 9.4  RBC 4.78 4.61 4.86  HGB 11.7* 11.1* 11.8*  HCT 36.1 35.0* 37.3  MCV 75.5* 75.9* 76.7*  MCH 24.5* 24.1*  24.3*  MCHC 32.4 31.7 31.6  RDW 16.2* 16.5* 16.5*  PLT 188 204 249   Thyroid No results for input(s): "TSH", "FREET4" in the last 168 hours.  BNP Recent Labs  Lab 02/03/22 2253  BNP 970.6*    DDimer  Recent Labs  Lab 02/03/22 2253  DDIMER 0.56*     Radiology    No results found.  Cardiac Studies   Echo: 01/03/2022   IMPRESSIONS     1. Basal inferior hypokinesis/akinesis. Poor acoustic windows Definity  used.     Compared to echo from June 2023 basal inferior changes are more  prominent. Left ventricular ejection fraction, by estimation, is 55 to  60%. The left ventricle has normal function. Left ventricular diastolic  parameters are consistent with Grade I  diastolic dysfunction (impaired relaxation).   2. Right ventricular systolic function is low normal. The right  ventricular size is normal. There is normal pulmonary artery systolic  pressure.   3. Left atrial size was mildly dilated.   4. Mild mitral valve regurgitation.   5. AV is mildly thickened, calcified There is very mildly restricted  motion of the valve . Peak and mean gradients through the valve are 11 and  5 mm Hg respectively. No signficant change when compared to echo from June  2023. Marland Kitchen The aortic valve is  tricuspid.   6. There is mild dilatation of the ascending aorta, measuring 39 mm.   7. The inferior vena cava is normal in  size with greater than 50%  respiratory variability, suggesting right atrial pressure of 3 mmHg.   FINDINGS   Left Ventricle: Basal inferior hypokinesis/akinesis. Poor acoustic  windows Definity used.  Compared to echo from June 2023 basal inferior changes are more prominent.  Left ventricular ejection fraction, by estimation, is 55 to 60%. The left  ventricle has normal function. Definity contrast agent was given IV to  delineate the left ventricular  endocardial borders. The left ventricular internal cavity size was normal  in size. There is no left ventricular hypertrophy. Left ventricular  diastolic parameters are consistent with Grade I diastolic dysfunction  (impaired relaxation).   Right Ventricle: The right ventricular size is normal. Right vetricular  wall thickness was not assessed. Right ventricular systolic function is  low normal. There is normal pulmonary artery systolic pressure. The  tricuspid regurgitant velocity is 2.71  m/s, and with an assumed right atrial pressure of 3 mmHg, the estimated  right ventricular systolic pressure is 41.9 mmHg.   Left Atrium: Left atrial size was mildly dilated.   Right Atrium: Right atrial size was normal in size.   Pericardium: Trivial pericardial effusion is present.   Mitral Valve: There is mild thickening of the mitral valve leaflet(s).  Mild mitral valve regurgitation.   Tricuspid Valve: The tricuspid valve is normal in structure. Tricuspid  valve regurgitation is trivial.   Aortic Valve: AV is mildly thickened, calcified There is very mildly  restricted motion of the valve . Peak and mean gradients through the valve  are 11 and 5 mm Hg respectively. No signficant change when compared to  echo from June 2023. The aortic valve  is tricuspid. Aortic valve mean gradient measures 5.0 mmHg. Aortic valve  peak gradient measures 11.0 mmHg. Aortic valve area, by VTI measures 2.21  cm.   Pulmonic Valve: The pulmonic valve was  normal in structure. Pulmonic valve  regurgitation is trivial.   Aorta: The aortic root is normal in size and structure. There is  mild  dilatation of the ascending aorta, measuring 39 mm.   Venous: The inferior vena cava is normal in size with greater than 50%  respiratory variability, suggesting right atrial pressure of 3 mmHg.   IAS/Shunts: No atrial level shunt detected by color flow Doppler.   Patient Profile     89 y.o. female with a hx of hypertension, diabetes, peptic ulcer disease, carotid artery disease, HFpEF, ascending thoracic aortic aneurysm, MVP, remote cerebral aneurysm status post coiling, colon CA status post resection, paroxysmal atrial fibrillation who was seen 02/07/2022 for the evaluation of A-fib RVR at the request of Dr. Broadus John.   Assessment & Plan    Paroxysmal atrial fibrillation -- initially dx 12/2021, converted to SR during that admission. Now presents back with intermittent episodes of afib in the setting of GI illness. She was briefly treated with IV diltiazem. Rates improved into the 90-100 range. I did discuss cardioversion with her, but she does not wish to proceed currently which is reasonable. Also in the setting of acute illness, she may not hold SR. At this time, would focus on rate control strategy.  -- initially on coreg 6.'25mg'$  BID, diltiazem '60mg'$  q6hr. Given her advanced age and concern of bradycardia as an outpatient, concerned to have on both BB/CCB therapy -- Diltiazem stopped, switched to metoprolol, rates improved mildly elevated at times. Will further increase to metoprolol '75mg'$  BID -- continue Eliquis   Nausea, vomiting, diarrhea -- Presented with several days of symptoms, respiratory panel negative.  CT abdomen without acute findings -- Management per primary   HFpEF -- BNP 970, has received several doses of IV lasix. I&Os not accurate  -- lasix '20mg'$  MWF PTA   Mildly elevated troponin -- hsTn 163>>147, suspect demand ischemia in the  setting of acute GI illness -- no chest pain, has been offered ischemic evaluation during last admission, but declined. -- can consider as outpatient    Hypertension  -- blood pressures were reported in the 200s on admission, has had some softer blood pressures at times -- as above, stop Dilt/coreg -- increase metoprolol to '75mg'$  BID, would favor addition of ARB if additional BP meds need  For questions or updates, please contact [place] Please consult [email or web address] for contact info under        Signed, Reino Bellis, NP  02/08/2022, 8:17 AM    Patient seen, examined. Available data reviewed. Agree with findings, assessment, and plan as outlined by Reino Bellis, NP.  The patient is independently interviewed and examined.  She is resting comfortably in bed.  Still complaining of some nausea but her diarrhea has resolved.  No chest pain or shortness of breath.  Lungs are clear to auscultation bilaterally.  Heart is irregularly irregular with no murmur or gallop.  Abdomen is soft and nontender.  Lower extremities are tender to palpation but there is no significant edema.  Telemetry is reviewed and shows atrial fibrillation with a heart rate of 90 to 120 bpm.  I agree with increasing her metoprolol to 75 mg twice daily.  Will assess her response to this and she may need further medication titration for rate control.  As outlined above and as per yesterday's note, we plan to treat her with a rate control strategy.  She continues on apixaban 5 mg twice daily for oral anticoagulation.  Sherren Mocha, M.D. 02/08/2022 10:04 AM

## 2022-02-08 NOTE — TOC Progression Note (Addendum)
Transition of Care [facility]) - Progression Note    Patient Details  Name: [name] MRN: [id number] Date of Birth: 15-Sep-1932  Transition of Care [facility]) CM/SW [place], [place] Phone Number: 02/08/2022, 11:51 AM  Clinical Narrative:     CSW spoke with patient at bedside. Patient has now decided to return back home when medically ready for dc. Patient informed CSW she wants to return back home to her spouse. Patient informed CSW she has support from her step daughters and caregiver if needed. CSW called patients insurance and cancelled insurance authorization for SNF placement for patient.CSW informed MD and CM. CSW will continue to follow and assist with patients dc planning needs.  Expected Discharge Plan: [place] Barriers to Discharge: Continued Medical Work up  Expected Discharge Plan and Services In-house Referral: Clinical Social Work     Living arrangements for the past 2 months: Single Family Home                                       Social Determinants of Health (SDOH) Interventions SDOH Screenings   Food Insecurity: No Food Insecurity (02/05/2022)  Housing: Low Risk  (02/05/2022)  Transportation Needs: No Transportation Needs (02/05/2022)  Utilities: Not At Risk (02/05/2022)  Tobacco Use: Low Risk  (02/03/2022)    Readmission Risk Interventions     No data to display

## 2022-02-08 NOTE — Progress Notes (Signed)
Mobility Specialist - Progress Note   02/08/22 1033  Mobility  Activity Transferred to/from [facility]  Level of Assistance Maximum assist, patient does 25-49%  Assistive Device Front wheel walker  Activity Response Tolerated well  Mobility Referral Yes  $Mobility charge 1 Mobility   Pt was received in [facility] and needing assistance back to bed. Pt was MinA to stand and MaxA for periccare. Pt was returned to bed with all needs met.   Franki Monte  Mobility Specialist Please contact via Solicitor or Rehab office at [phone]

## 2022-02-08 NOTE — Progress Notes (Signed)
PROGRESS NOTE    [name]  ELF:[id number] DOB: [birth year] DOA: 02/03/2022 PCP: Antony Contras, MD  89/F with history of DM2, hypertension, AAA, PVD, TIA, cerebral aneurysm status post coiling, GERD, duodenitis, IBS, anxiety.  Admitted last month in early December for new onset A-fib with RVR and was started on Coreg and Eliquis.  -Presented to the ED 1/4 with nausea vomiting and diarrhea -In the ED she was noted to be hypertensive, blood pressure 220, mildly hypoxic requiring 2 L O2.  Labs were unremarkable except for mildly elevated high-sensitivity troponin, flu and COVID PCR were negative, CT abdomen pelvis negative for acute findings, chest x-ray noted pulmonary vascular congestion and possible mild interstitial edema. -Complicated by A-fib RVR -gastroenteritis improving, nausea persists -Recurrent A-fib with RVR, cardiology consulting  Subjective: -Some nausea this morning, overall better yesterday, was able to eat 2 meals, had 1 bowel movement semisolid  Assessment and Plan   Nausea/vomiting/diarrhea -Suspect viral gastroenteritis, flu and COVID PCR negative, CT abdomen pelvis without acute findings -Continue supportive care, no further diarrhea or vomiting -Diet as tolerated, mild persistent nausea-Phenergan x 1, unclear if persistent A-fib RVR is contributing to her nausea as well -Increase activity, PT eval, patient declines rehab  Atrial fibrillation with RVR -Recent echo with preserved EF, required Cardizem drip on admission -Recurrent A-fib RVR this admission -I wonder if A-fib is contributing to her persistent nausea, appreciate cards input, now on metoprolol, dose increased, hopefully does not end up needing a cardioversion -continue Eliquis  Acute on chronic diastolic CHF Mild hypoxia -Likely triggered by A-fib, has been taking Lasix 3 times a week, chest x-ray with pulmonary vascular congestion and mild edema -Treated with 2 doses of IV Lasix, now euvolemic, hold  further diuretics -Last echo 12/23 with EF 55-60%, grade 1 DD -Incentive spirometry, weaned off O2   elevated troponin -Likely secondary to demand in the setting of CHF, A-fib -Do not suspect ACS   Hypertensive urgency -Systolic as high as 585, now improved  -Continue Coreg, diuretics as above   Insulin-dependent diabetes -A1c 6.2 on 01/02/2022 -Sensitive sliding scale insulin ACHS -Continue current dose of Lantus  DVT prophylaxis: Eliquis Code Status: DNR Family Communication: None present at bedside, called and updated stepdaughter Debbie yesterday Disposition Plan: Home in 1 to 2 days, she declines rehab  Consultants: Cards   Procedures:   Antimicrobials:    Objective: Vitals:   02/08/22 0500 02/08/22 0840 02/08/22 1010 02/08/22 1231  BP:  (!) 147/96  129/81  Pulse:  78 (!) 107 76  Resp:    18  Temp:    98.7 F (37.1 C)  TempSrc:    Oral  SpO2:  97%  93%  Weight: 66.5 kg     Height:        Intake/Output Summary (Last 24 hours) at 02/08/2022 1347 Last data filed at 02/08/2022 0930 Gross per 24 hour  Intake --  Output 250 ml  Net -250 ml   Filed Weights   02/06/22 0517 02/07/22 0400 02/08/22 0500  Weight: 65.8 kg 65.2 kg 66.5 kg    Examination:  General exam: Pleasant elderly female chronically ill, laying in bed, AAOx3, hard of hearing HEENT: No JVD CVS: S1-S2, irregularly irregular rhythm, tachycardic Lungs: Decreased breath sounds at the bases Abdomen: Soft, nontender, bowel sounds present Extremities: No edema   Skin: No rashes Psychiatry:  Mood & affect appropriate.     Data Reviewed:   CBC: Recent Labs  Lab 02/03/22 1420 02/05/22 0228 02/07/22  0157  WBC 6.5 10.9* 9.4  NEUTROABS 3.6  --   --   HGB 11.7* 11.1* 11.8*  HCT 36.1 35.0* 37.3  MCV 75.5* 75.9* 76.7*  PLT 188 204 767   Basic Metabolic Panel: Recent Labs  Lab 02/03/22 1420 02/04/22 0459 02/05/22 0228 02/06/22 0136 02/07/22 0157  NA 138 142 141 139 136  K 3.8 3.3* 4.5  4.7 4.0  CL 102 104 108 101 103  CO2 '25 24 26 28 26  '$ GLUCOSE 130* 93 112* 93 99  BUN '14 12 20 '$ 27* 26*  CREATININE 0.83 0.83 1.11* 0.91 0.94  CALCIUM 8.8* 8.6* 9.0 8.9 8.7*   GFR: Estimated Creatinine Clearance: 38.6 mL/min (by C-G formula based on SCr of 0.94 mg/dL). Liver Function Tests: Recent Labs  Lab 02/03/22 1420 02/05/22 0228  AST 49* 53*  ALT 51* 44  ALKPHOS 56 51  BILITOT 0.7 1.0  PROT 6.4* 6.1*  ALBUMIN 3.7 3.4*   Recent Labs  Lab 02/03/22 1420  LIPASE 23   No results for input(s): "AMMONIA" in the last 168 hours. Coagulation Profile: No results for input(s): "INR", "PROTIME" in the last 168 hours. Cardiac Enzymes: No results for input(s): "CKTOTAL", "CKMB", "CKMBINDEX", "TROPONINI" in the last 168 hours. BNP (last 3 results) No results for input(s): "PROBNP" in the last 8760 hours. HbA1C: No results for input(s): "HGBA1C" in the last 72 hours. CBG: Recent Labs  Lab 02/07/22 1220 02/07/22 1601 02/07/22 2159 02/08/22 0806 02/08/22 1233  GLUCAP 183* 168* 138* 167* 158*   Lipid Profile: No results for input(s): "CHOL", "HDL", "LDLCALC", "TRIG", "CHOLHDL", "LDLDIRECT" in the last 72 hours. Thyroid Function Tests: No results for input(s): "TSH", "T4TOTAL", "FREET4", "T3FREE", "THYROIDAB" in the last 72 hours. Anemia Panel: No results for input(s): "VITAMINB12", "FOLATE", "FERRITIN", "TIBC", "IRON", "RETICCTPCT" in the last 72 hours. Urine analysis:    Component Value Date/Time   COLORURINE YELLOW 02/03/2022 2122   APPEARANCEUR CLEAR 02/03/2022 2122   LABSPEC 1.035 (H) 02/03/2022 2122   PHURINE 5.0 02/03/2022 2122   GLUCOSEU NEGATIVE 02/03/2022 2122   HGBUR MODERATE (A) 02/03/2022 2122   BILIRUBINUR NEGATIVE 02/03/2022 2122   KETONESUR 20 (A) 02/03/2022 2122   PROTEINUR 100 (A) 02/03/2022 2122   UROBILINOGEN 0.2 04/01/2014 1627   NITRITE NEGATIVE 02/03/2022 2122   LEUKOCYTESUR TRACE (A) 02/03/2022 2122   Sepsis  Labs: '@LABRCNTIP'$ (procalcitonin:4,lacticidven:4)  ) Recent Results (from the past 240 hour(s))  Urine Culture     Status: Abnormal   Collection Time: 02/03/22  4:02 PM   Specimen: Urine, Clean Catch  Result Value Ref Range Status   Specimen Description URINE, CLEAN CATCH  Final   Special Requests   Final    NONE Performed at [facility], [place] [address] [place], [place] [address]    Culture MULTIPLE SPECIES PRESENT, SUGGEST RECOLLECTION (A)  Final   Report Status 02/04/2022 FINAL  Final  Resp panel by RT-PCR (RSV, Flu A&B, Covid) Anterior Nasal Swab     Status: None   Collection Time: 02/03/22  8:05 PM   Specimen: Anterior Nasal Swab  Result Value Ref Range Status   SARS Coronavirus 2 by RT PCR NEGATIVE NEGATIVE Final    Comment: (NOTE) SARS-CoV-2 target nucleic acids are NOT DETECTED.  The SARS-CoV-2 RNA is generally detectable in upper respiratory specimens during the acute phase of infection. The lowest concentration of SARS-CoV-2 viral copies this assay can detect is 138 copies/mL. A negative result does not preclude SARS-Cov-2 infection and should not be  used as the sole basis for treatment or other patient management decisions. A negative result may occur with  improper specimen collection/handling, submission of specimen other than nasopharyngeal swab, presence of viral mutation(s) within the areas targeted by this assay, and inadequate number of viral copies(<138 copies/mL). A negative result must be combined with clinical observations, patient history, and epidemiological information. The expected result is Negative.  Fact Sheet for Patients:  EntrepreneurPulse.com.au  Fact Sheet for Healthcare Providers:  IncredibleEmployment.be  This test is no t yet approved or cleared by the [place] FDA and  has been authorized for detection and/or diagnosis of SARS-CoV-2 by FDA under an Emergency Use Authorization (EUA). This  EUA will remain  in effect (meaning this test can be used) for the duration of the COVID-19 declaration under Section 564(b)(1) of the Act, 21 U.S.C.section 360bbb-3(b)(1), unless the authorization is terminated  or revoked sooner.       Influenza A by PCR NEGATIVE NEGATIVE Final   Influenza B by PCR NEGATIVE NEGATIVE Final    Comment: (NOTE) The Xpert Xpress SARS-CoV-2/FLU/RSV plus assay is intended as an aid in the diagnosis of influenza from Nasopharyngeal swab specimens and should not be used as a sole basis for treatment. Nasal washings and aspirates are unacceptable for Xpert Xpress SARS-CoV-2/FLU/RSV testing.  Fact Sheet for Patients: EntrepreneurPulse.com.au  Fact Sheet for Healthcare Providers: IncredibleEmployment.be  This test is not yet approved or cleared by the [place] FDA and has been authorized for detection and/or diagnosis of SARS-CoV-2 by FDA under an Emergency Use Authorization (EUA). This EUA will remain in effect (meaning this test can be used) for the duration of the COVID-19 declaration under Section 564(b)(1) of the Act, 21 U.S.C. section 360bbb-3(b)(1), unless the authorization is terminated or revoked.     Resp Syncytial Virus by PCR NEGATIVE NEGATIVE Final    Comment: (NOTE) Fact Sheet for Patients: EntrepreneurPulse.com.au  Fact Sheet for Healthcare Providers: IncredibleEmployment.be  This test is not yet approved or cleared by the [place] FDA and has been authorized for detection and/or diagnosis of SARS-CoV-2 by FDA under an Emergency Use Authorization (EUA). This EUA will remain in effect (meaning this test can be used) for the duration of the COVID-19 declaration under Section 564(b)(1) of the Act, 21 U.S.C. section 360bbb-3(b)(1), unless the authorization is terminated or revoked.  Performed at [facility], [place] [address], [place],  [place] [address]      Radiology Studies: No results found.   Scheduled Meds:  apixaban  5 mg Oral BID   insulin aspart  0-5 Units Subcutaneous QHS   insulin aspart  0-9 Units Subcutaneous TID WC   metoprolol tartrate  75 mg Oral BID   pantoprazole  40 mg Oral Q1200   Continuous Infusions:      LOS: 5 days    Time spent: 76mn    PDomenic Polite MD [facility]   02/08/2022, 1:47 PM

## 2022-02-09 ENCOUNTER — Ambulatory Visit: Payer: Medicare PPO | Admitting: Cardiology

## 2022-02-09 DIAGNOSIS — I480 Paroxysmal atrial fibrillation: Secondary | ICD-10-CM | POA: Diagnosis not present

## 2022-02-09 LAB — BASIC METABOLIC PANEL
Anion gap: 8 (ref 5–15)
BUN: 26 mg/dL — ABNORMAL HIGH (ref 8–23)
CO2: 23 mmol/L (ref 22–32)
Calcium: 8.5 mg/dL — ABNORMAL LOW (ref 8.9–10.3)
Chloride: 105 mmol/L (ref 98–111)
Creatinine, Ser: 0.92 mg/dL (ref 0.44–1.00)
GFR, Estimated: >60 mL/min (ref >60)
Glucose, Bld: 112 mg/dL — ABNORMAL HIGH (ref 70–99)
Potassium: 4.1 mmol/L (ref 3.5–5.1)
Sodium: 136 mmol/L (ref 135–145)

## 2022-02-09 LAB — CBC
HCT: 39.7 % (ref 36.0–46.0)
Hemoglobin: 12.4 g/dL (ref 12.0–15.0)
MCH: 24 pg — ABNORMAL LOW (ref 26.0–34.0)
MCHC: 31.2 g/dL (ref 30.0–36.0)
MCV: 76.9 fL — ABNORMAL LOW (ref 80.0–100.0)
Platelets: 270 10*3/uL (ref 150–400)
RBC: 5.16 MIL/uL — ABNORMAL HIGH (ref 3.87–5.11)
RDW: 17 % — ABNORMAL HIGH (ref 11.5–15.5)
WBC: 11.7 10*3/uL — ABNORMAL HIGH (ref 4.0–10.5)
nRBC: 0 % (ref 0.0–0.2)

## 2022-02-09 LAB — GLUCOSE, CAPILLARY
Glucose-Capillary: 128 mg/dL — ABNORMAL HIGH (ref 70–99)
Glucose-Capillary: 200 mg/dL — ABNORMAL HIGH (ref 70–99)
Glucose-Capillary: 148 mg/dL — ABNORMAL HIGH (ref 70–99)
Glucose-Capillary: 176 mg/dL — ABNORMAL HIGH (ref 70–99)

## 2022-02-09 MED ORDER — METOPROLOL TARTRATE 50 MG PO TABS
50.0000 mg | ORAL_TABLET | Freq: Two times a day (BID) | ORAL | Status: DC
  Administered 2022-02-09 – 2022-02-11 (×4): 50 mg via ORAL
  Filled 2022-02-09 (×5): qty 1

## 2022-02-09 NOTE — Progress Notes (Signed)
PROGRESS NOTE    [name]  EUM:[id number] DOB: 1932-05-23 DOA: 02/03/2022 PCP: Antony Contras, MD  Chief Complaint  Patient presents with   Emesis   Diarrhea    Brief Narrative:  89/F with history of DM2, hypertension, AAA, PVD, TIA, cerebral aneurysm status post coiling, GERD, duodenitis, IBS, anxiety.  Admitted last month in early December for new onset Chaquana Nichols-fib with RVR and was started on Coreg and Eliquis.  -Presented to the ED 1/4 with nausea vomiting and diarrhea -In the ED she was noted to be hypertensive, blood pressure 220, mildly hypoxic requiring 2 L O2.  Labs were unremarkable except for mildly elevated high-sensitivity troponin, flu and COVID PCR were negative, CT abdomen pelvis negative for acute findings, chest x-ray noted pulmonary vascular congestion and possible mild interstitial edema. -Complicated by Sanders Manninen-fib RVR -gastroenteritis improving, nausea persists -Recurrent Barry Culverhouse-fib with RVR, cardiology consulting     Assessment & Plan:   Principal Problem:   Nausea and vomiting Active Problems:   Elevated troponin   Diarrhea   DM (diabetes mellitus) (HCC)   Chest pain   Acute hypoxemic respiratory failure (HCC)   Hypertensive urgency   Paroxysmal atrial fibrillation (HCC)   Acute on chronic heart failure with preserved ejection fraction (HCC)   Atrial fibrillation (HCC)  Nausea/vomiting/diarrhea -Suspect viral gastroenteritis, flu and COVID PCR negative, CT abdomen pelvis without acute findings -Continue supportive care, no further diarrhea or vomiting -Diet as tolerated, improved appetite today -Increase activity, PT eval, patient declines rehab   Atrial fibrillation with RVR -Recent echo with preserved EF, required Cardizem drip on admission -Recurrent Anthonie Lotito-fib RVR this admission -I wonder if Tyanne Derocher-fib is contributing to her persistent nausea, appreciate cards input, now on metoprolol, dose reduced to 50 mg BID, now in sinus -continue Eliquis   Acute on chronic  diastolic CHF Mild hypoxia -Likely triggered by Karlei Waldo-fib, has been taking Lasix 3 times Arshad Oberholzer week, chest x-ray with pulmonary vascular congestion and mild edema -Treated with 2 doses of IV Lasix, now euvolemic, hold further diuretics -trend, adjust diuretics as needed -Last echo 12/23 with EF 55-60%, grade 1 DD -Incentive spirometry, weaned off O2    elevated troponin -Likely secondary to demand in the setting of CHF, Connor Meacham-fib -Do not suspect ACS   Hypertensive urgency -Systolic as high as 540, now improved  -Continue metoprolol (dose reduced), diuretics as above   Insulin-dependent diabetes -A1c 6.2 on 01/02/2022 -Sensitive sliding scale insulin ACHS -Continue current dose of Lantus    DVT prophylaxis: eliquis Code Status: DNR Family Communication: none Disposition:   Status is: Inpatient Remains inpatient appropriate because: pending further improvemetn   Consultants:  cards  Procedures:  none  Antimicrobials:  Anti-infectives (From admission, onward)    None       Subjective: Feels weak, doesn't feel she could d/c today  Objective: Vitals:   02/09/22 0450 02/09/22 0845 02/09/22 1502 02/09/22 1710  BP: 128/61 (!) 169/68 (!) 161/66 (!) 143/70  Pulse: (!) 53 (!) 58 (!) 55 (!) 59  Resp: '18  18 18  '$ Temp: 98.4 F (36.9 C)  97.8 F (36.6 C) 98.8 F (37.1 C)  TempSrc: Oral  Oral Oral  SpO2: 94% 91% 95% 95%  Weight:      Height:        Intake/Output Summary (Last 24 hours) at 02/09/2022 1932 Last data filed at 02/09/2022 1700 Gross per 24 hour  Intake --  Output 750 ml  Net -750 ml   [employer]   02/06/22  1751 02/07/22 0400 02/08/22 0500  Weight: 65.8 kg 65.2 kg 66.5 kg    Examination:  General exam: Appears calm and comfortable  Respiratory system: Clear to auscultation. Respiratory effort normal. Cardiovascular system: RRR Gastrointestinal system: Abdomen is nondistended, soft and nontender. Central nervous system: Alert and oriented. No focal  neurological deficits. Extremities: no LEE   Data Reviewed: I have personally reviewed following labs and imaging studies  CBC: Recent Labs  Lab 02/03/22 1420 02/05/22 0228 02/07/22 0157 02/09/22 0128  WBC 6.5 10.9* 9.4 11.7*  NEUTROABS 3.6  --   --   --   HGB 11.7* 11.1* 11.8* 12.4  HCT 36.1 35.0* 37.3 39.7  MCV 75.5* 75.9* 76.7* 76.9*  PLT 188 204 249 025    Basic Metabolic Panel: Recent Labs  Lab 02/04/22 0459 02/05/22 0228 02/06/22 0136 02/07/22 0157 02/09/22 0128  NA 142 141 139 136 136  K 3.3* 4.5 4.7 4.0 4.1  CL 104 108 101 103 105  CO2 '24 26 28 26 23  '$ GLUCOSE 93 112* 93 99 112*  BUN 12 20 27* 26* 26*  CREATININE 0.83 1.11* 0.91 0.94 0.92  CALCIUM 8.6* 9.0 8.9 8.7* 8.5*    GFR: Estimated Creatinine Clearance: 39.4 mL/min (by C-G formula based on SCr of 0.92 mg/dL).  Liver Function Tests: Recent Labs  Lab 02/03/22 1420 02/05/22 0228  AST 49* 53*  ALT 51* 44  ALKPHOS 56 51  BILITOT 0.7 1.0  PROT 6.4* 6.1*  ALBUMIN 3.7 3.4*    CBG: Recent Labs  Lab 02/08/22 1649 02/08/22 2107 02/09/22 0810 02/09/22 1204 02/09/22 1707  GLUCAP 88 239* 128* 200* 148*     Recent Results (from the past 240 hour(s))  Urine Culture     Status: Abnormal   Collection Time: 02/03/22  4:02 PM   Specimen: Urine, Clean Catch  Result Value Ref Range Status   Specimen Description URINE, CLEAN CATCH  Final   Special Requests   Final    NONE Performed at [facility], [place] [address] [place], [place] [address]    Culture MULTIPLE SPECIES PRESENT, SUGGEST RECOLLECTION (Leopoldo Mazzie)  Final   Report Status 02/04/2022 FINAL  Final  Resp panel by RT-PCR (RSV, Flu Owain Eckerman&B, Covid) Anterior Nasal Swab     Status: None   Collection Time: 02/03/22  8:05 PM   Specimen: Anterior Nasal Swab  Result Value Ref Range Status   SARS Coronavirus 2 by RT PCR NEGATIVE NEGATIVE Final    Comment: (NOTE) SARS-CoV-2 target nucleic acids are NOT DETECTED.  The SARS-CoV-2 RNA is generally  detectable in upper respiratory specimens during the acute phase of infection. The lowest concentration of SARS-CoV-2 viral copies this assay can detect is 138 copies/mL. Ashlyne Olenick negative result does not preclude SARS-Cov-2 infection and should not be used as the sole basis for treatment or other patient management decisions. Ed Rayson negative result may occur with  improper specimen collection/handling, submission of specimen other than nasopharyngeal swab, presence of viral mutation(s) within the areas targeted by this assay, and inadequate number of viral copies(<138 copies/mL). Boleslaus Holloway negative result must be combined with clinical observations, patient history, and epidemiological information. The expected result is Negative.  Fact Sheet for Patients:  EntrepreneurPulse.com.au  Fact Sheet for Healthcare Providers:  IncredibleEmployment.be  This test is no t yet approved or cleared by the [place] FDA and  has been authorized for detection and/or diagnosis of SARS-CoV-2 by FDA under an Emergency Use Authorization (EUA). This EUA will remain  in effect (meaning this test can be used) for the duration of the COVID-19 declaration under Section 564(b)(1) of the Act, 21 U.S.C.section 360bbb-3(b)(1), unless the authorization is terminated  or revoked sooner.       Influenza Zakarie Sturdivant by PCR NEGATIVE NEGATIVE Final   Influenza B by PCR NEGATIVE NEGATIVE Final    Comment: (NOTE) The Xpert Xpress SARS-CoV-2/FLU/RSV plus assay is intended as an aid in the diagnosis of influenza from Nasopharyngeal swab specimens and should not be used as Sruthi Maurer sole basis for treatment. Nasal washings and aspirates are unacceptable for Xpert Xpress SARS-CoV-2/FLU/RSV testing.  Fact Sheet for Patients: EntrepreneurPulse.com.au  Fact Sheet for Healthcare Providers: IncredibleEmployment.be  This test is not yet approved or cleared by the [place] FDA  and has been authorized for detection and/or diagnosis of SARS-CoV-2 by FDA under an Emergency Use Authorization (EUA). This EUA will remain in effect (meaning this test can be used) for the duration of the COVID-19 declaration under Section 564(b)(1) of the Act, 21 U.S.C. section 360bbb-3(b)(1), unless the authorization is terminated or revoked.     Resp Syncytial Virus by PCR NEGATIVE NEGATIVE Final    Comment: (NOTE) Fact Sheet for Patients: EntrepreneurPulse.com.au  Fact Sheet for Healthcare Providers: IncredibleEmployment.be  This test is not yet approved or cleared by the [place] FDA and has been authorized for detection and/or diagnosis of SARS-CoV-2 by FDA under an Emergency Use Authorization (EUA). This EUA will remain in effect (meaning this test can be used) for the duration of the COVID-19 declaration under Section 564(b)(1) of the Act, 21 U.S.C. section 360bbb-3(b)(1), unless the authorization is terminated or revoked.  Performed at [facility], [place] [address], [place], [place] [address]          Radiology Studies: No results found.      Scheduled Meds:  apixaban  5 mg Oral BID   insulin aspart  0-5 Units Subcutaneous QHS   insulin aspart  0-9 Units Subcutaneous TID WC   metoprolol tartrate  50 mg Oral BID   pantoprazole  40 mg Oral Q1200   Continuous Infusions:   LOS: 6 days    Time spent: over 30 min    Fayrene Helper, MD [facility]   To contact the attending provider between 7A-7P or the covering provider during after hours 7P-7A, please log into the web site [email or web address] and access using universal [place] password for that web site. If you do not have the password, please call the hospital operator.  02/09/2022, 7:32 PM

## 2022-02-09 NOTE — Progress Notes (Addendum)
Rounding Note    Patient Name: [name] Date of Encounter: 02/09/2022  [facility] Cardiologist: Fransico Him, MD   Subjective   Is feeling better this morning. Still with some nausea.   Inpatient Medications    Scheduled Meds:  apixaban  5 mg Oral BID   insulin aspart  0-5 Units Subcutaneous QHS   insulin aspart  0-9 Units Subcutaneous TID WC   metoprolol tartrate  75 mg Oral BID   pantoprazole  40 mg Oral Q1200   Continuous Infusions:  PRN Meds: acetaminophen **OR** acetaminophen, hydrALAZINE, ondansetron (ZOFRAN) IV   Vital Signs    Vitals:   02/08/22 1645 02/08/22 2100 02/09/22 0046 02/09/22 0450  BP: 109/82 139/63 (!) 147/71 128/61  Pulse: (!) 59 68 62 (!) 53  Resp: '19 20 18 18  '$ Temp: 97.7 F (36.5 C) 98.3 F (36.8 C) 98.9 F (37.2 C) 98.4 F (36.9 C)  TempSrc: Oral Oral Oral Oral  SpO2: 96% 94% 97% 94%  Weight:      Height:        Intake/Output Summary (Last 24 hours) at 02/09/2022 0818 Last data filed at 02/09/2022 0300 Gross per 24 hour  Intake --  Output 550 ml  Net -550 ml      02/08/2022    5:00 AM 02/07/2022    4:00 AM 02/06/2022    5:17 AM  Last 3 Weights  Weight (lbs) 146 lb 9.7 oz 143 lb 11.8 oz 145 lb 1 oz  Weight (kg) 66.5 kg 65.2 kg 65.8 kg      Telemetry    Sinus Bradycardia, PACs - Personally Reviewed  ECG    No new tracing   Physical Exam   GEN: No acute distress.   Neck: No JVD Cardiac: RRR, no murmurs, rubs, or gallops.  Respiratory: Clear to auscultation bilaterally. GI: Soft, nontender, non-distended  MS: No edema; No deformity. Neuro:  Nonfocal  Psych: Normal affect   Labs    High Sensitivity Troponin:   Recent Labs  Lab 02/03/22 1420 02/03/22 1600  TROPONINIHS 163* 147*     Chemistry Recent Labs  Lab 02/03/22 1420 02/04/22 0459 02/05/22 0228 02/06/22 0136 02/07/22 0157 02/09/22 0128  NA 138   < > 141 139 136 136  K 3.8   < > 4.5 4.7 4.0 4.1  CL 102   < > 108 101 103 105  CO2 25    < > '26 28 26 23  '$ GLUCOSE 130*   < > 112* 93 99 112*  BUN 14   < > 20 27* 26* 26*  CREATININE 0.83   < > 1.11* 0.91 0.94 0.92  CALCIUM 8.8*   < > 9.0 8.9 8.7* 8.5*  PROT 6.4*  --  6.1*  --   --   --   ALBUMIN 3.7  --  3.4*  --   --   --   AST 49*  --  53*  --   --   --   ALT 51*  --  44  --   --   --   ALKPHOS 56  --  51  --   --   --   BILITOT 0.7  --  1.0  --   --   --   GFRNONAA >60   < > 48* >60 59* >60  ANIONGAP 11   < > '7 10 7 8   '$ < > = values in this interval not displayed.  Lipids No results for input(s): "CHOL", "TRIG", "HDL", "LABVLDL", "LDLCALC", "CHOLHDL" in the last 168 hours.  Hematology Recent Labs  Lab 02/05/22 0228 02/07/22 0157 02/09/22 0128  WBC 10.9* 9.4 11.7*  RBC 4.61 4.86 5.16*  HGB 11.1* 11.8* 12.4  HCT 35.0* 37.3 39.7  MCV 75.9* 76.7* 76.9*  MCH 24.1* 24.3* 24.0*  MCHC 31.7 31.6 31.2  RDW 16.5* 16.5* 17.0*  PLT 204 249 270   Thyroid No results for input(s): "TSH", "FREET4" in the last 168 hours.  BNP Recent Labs  Lab 02/03/22 2253  BNP 970.6*    DDimer  Recent Labs  Lab 02/03/22 2253  DDIMER 0.56*     Radiology    No results found.  Cardiac Studies   Echo: 01/03/2022   IMPRESSIONS     1. Basal inferior hypokinesis/akinesis. Poor acoustic windows Definity  used.     Compared to echo from June 2023 basal inferior changes are more  prominent. Left ventricular ejection fraction, by estimation, is 55 to  60%. The left ventricle has normal function. Left ventricular diastolic  parameters are consistent with Grade I  diastolic dysfunction (impaired relaxation).   2. Right ventricular systolic function is low normal. The right  ventricular size is normal. There is normal pulmonary artery systolic  pressure.   3. Left atrial size was mildly dilated.   4. Mild mitral valve regurgitation.   5. AV is mildly thickened, calcified There is very mildly restricted  motion of the valve . Peak and mean gradients through the valve are 11  and  5 mm Hg respectively. No signficant change when compared to echo from June  2023. Marland Kitchen The aortic valve is  tricuspid.   6. There is mild dilatation of the ascending aorta, measuring 39 mm.   7. The inferior vena cava is normal in size with greater than 50%  respiratory variability, suggesting right atrial pressure of 3 mmHg.   FINDINGS   Left Ventricle: Basal inferior hypokinesis/akinesis. Poor acoustic  windows Definity used.  Compared to echo from June 2023 basal inferior changes are more prominent.  Left ventricular ejection fraction, by estimation, is 55 to 60%. The left  ventricle has normal function. Definity contrast agent was given IV to  delineate the left ventricular  endocardial borders. The left ventricular internal cavity size was normal  in size. There is no left ventricular hypertrophy. Left ventricular  diastolic parameters are consistent with Grade I diastolic dysfunction  (impaired relaxation).   Right Ventricle: The right ventricular size is normal. Right vetricular  wall thickness was not assessed. Right ventricular systolic function is  low normal. There is normal pulmonary artery systolic pressure. The  tricuspid regurgitant velocity is 2.71  m/s, and with an assumed right atrial pressure of 3 mmHg, the estimated  right ventricular systolic pressure is 09.6 mmHg.   Left Atrium: Left atrial size was mildly dilated.   Right Atrium: Right atrial size was normal in size.   Pericardium: Trivial pericardial effusion is present.   Mitral Valve: There is mild thickening of the mitral valve leaflet(s).  Mild mitral valve regurgitation.   Tricuspid Valve: The tricuspid valve is normal in structure. Tricuspid  valve regurgitation is trivial.   Aortic Valve: AV is mildly thickened, calcified There is very mildly  restricted motion of the valve . Peak and mean gradients through the valve  are 11 and 5 mm Hg respectively. No signficant change when compared to   echo from June 2023. The aortic valve  is tricuspid. Aortic valve mean gradient measures 5.0 mmHg. Aortic valve  peak gradient measures 11.0 mmHg. Aortic valve area, by VTI measures 2.21  cm.   Pulmonic Valve: The pulmonic valve was normal in structure. Pulmonic valve  regurgitation is trivial.   Aorta: The aortic root is normal in size and structure. There is mild  dilatation of the ascending aorta, measuring 39 mm.   Venous: The inferior vena cava is normal in size with greater than 50%  respiratory variability, suggesting right atrial pressure of 3 mmHg.   IAS/Shunts: No atrial level shunt detected by color flow Doppler.   Patient Profile     89 y.o. female  with a hx of hypertension, diabetes, peptic ulcer disease, carotid artery disease, HFpEF, ascending thoracic aortic aneurysm, MVP, remote cerebral aneurysm status post coiling, colon CA status post resection, paroxysmal atrial fibrillation who was seen 02/07/2022 for the evaluation of A-fib RVR at the request of Dr. Broadus John.    Assessment & Plan    Paroxysmal atrial fibrillation -- initially dx 12/2021, converted to SR during that admission. Now presents back with intermittent episodes of afib in the setting of GI illness. She was briefly treated with IV diltiazem. Rates improved into the 90-100 range. I did discuss cardioversion with her, but she does not wish to proceed currently which is reasonable. Also in the setting of acute illness, she may not hold SR. At this time, would focus on rate control strategy.  -- initially on coreg 6.'25mg'$  BID, diltiazem '60mg'$  q6hr. Given her advanced age and concern of bradycardia as an outpatient, concerned to have on both BB/CCB therapy -- Diltiazem stopped, switched to metoprolol up to '75mg'$  BID. Has converted to sinus bradycardia this morning. Rates in the 50s, will drop metoprolol back to '50mg'$  BID  -- continue Eliquis '5mg'$  BID   Nausea, vomiting, diarrhea -- Presented with several days of  symptoms, respiratory panel negative.  CT abdomen without acute findings -- Management per primary   HFpEF -- BNP 970, has received several doses of IV lasix. I&Os not accurate  -- lasix '20mg'$  MWF PTA   Mildly elevated troponin -- hsTn 163>>147, suspect demand ischemia in the setting of acute GI illness -- no chest pain, has been offered ischemic evaluation during last admission, but declined. -- can consider as outpatient evaluation if patient is agreeable   Hypertension  -- blood pressures were reported in the 200s on admission, has had some softer blood pressures at times. Now well controlled  -- as above, stop Dilt/coreg -- continue metoprolol '50mg'$  BID  Will arrange outpatient follow up  For questions or updates, please contact [place] Please consult [email or web address] for contact info under   Signed, Reino Bellis, NP  02/09/2022, 8:18 AM    Patient seen, examined. Available data reviewed. Agree with findings, assessment, and plan as outlined by Reino Bellis, NP.  The patient is independently interviewed and examined.  On my exam today, she is alert, oriented, elderly woman in no distress.  Heart is bradycardic and regular with no murmur gallop, abdomen is soft and nontender with no masses, lungs are clear bilaterally, extremities have no edema, skin is warm and dry with no rash.  Telemetry is reviewed and shows that she spontaneously converted from atrial fibrillation to sinus bradycardia early this morning.  Agree with reducing her metoprolol to 50 mg twice daily in the setting of her bradycardia.  Otherwise continue current management strategy.  Continue apixaban 5 mg twice daily.  Patient  appears stable from a cardiac perspective.  We will sign off today, but happy to see her back if she has recurrent cardiac problems.  Will arrange outpatient cardiology follow-up.  Sherren Mocha, M.D. 02/09/2022 11:07 AM

## 2022-02-09 NOTE — Care Management Important Message (Signed)
Important Message  Patient Details  Name: [name] MRN: [id number] Date of Birth: 1932-10-22   Medicare Important Message Given:  Yes     Shelda Altes 02/09/2022, 12:12 PM

## 2022-02-09 NOTE — Progress Notes (Signed)
Physical Therapy Treatment Patient Details Name: [name] MRN: [id number] DOB: 1932-06-12 Today's Date: 02/09/2022   History of Present Illness Pt is an 89 y.o. female admitted 02/03/21 with nausea, vomiting, diarrhea; suspect viral gastroenteritis. Pt also with HTN, afib with RVR. Recent admission 12/2021 for new onset afib with RVR. PMH includes DM2, HTN, AAA, PVD, TIA, cerebral aneurysm s/p coiling, IBS, duodenitis, anxiety.    PT Comments    On entry pt reports she is feeling less nauseous today and is agreeable to trying to walk. Pt is min guard for bed mobility and transfers however pt experiences dizziness with positional change and nausea with standing. Pt can not tolerate standing 3 min for collection of BP and request to return to supine. Pt reports that she thinks she can work with her husbands caregiver to provide assist for her at discharge. Pt is eager to get back to her husband but is not currently safe for ambulation.   Orthostatic BPs  Supine 159/64  Sitting 140/98  Standing 101/81  Standing after 2 min but returned to seated could not tolerate standing 3 min  146/74  Supine 164/62         Recommendations for follow up therapy are one component of a multi-disciplinary discharge planning process, led by the attending physician.  Recommendations may be updated based on patient status, additional functional criteria and insurance authorization.  Follow Up Recommendations  Home health PT Can patient physically be transported by private vehicle: Yes   Assistance Recommended at Discharge Frequent or constant Supervision/Assistance (assist for all out of bed)  Patient can return home with the following A little help with walking and/or transfers;A little help with bathing/dressing/bathroom;Assistance with cooking/housework;Assist for transportation;Help with stairs or ramp for entrance   Equipment Recommendations  BSC/3in1 (report she has cane and RW)        Precautions / Restrictions Precautions Precautions: Fall Precaution Comments: monitor BP Restrictions Weight Bearing Restrictions: No     Mobility  Bed Mobility Overal bed mobility: Needs Assistance Bed Mobility: Supine to Sit, Sit to Supine     Supine to sit: Min guard Sit to supine: Min guard   General bed mobility comments: increased time and effort required. patient reports dizziness with sitting upright that improved with increased sitting time    Transfers Overall transfer level: Needs assistance Equipment used: Rolling walker (2 wheels) Transfers: Sit to/from Stand Sit to Stand: Min guard           General transfer comment: pt able to come to standing with min guard, vc for hand placement for powerup, able to tolerate standing for initial BP, but unable to stand for 3 minutes as she became nauseous and had to return to sitting    Ambulation/Gait               General Gait Details: unable to ambulate due to nausea and dizziness with standing       Balance Overall balance assessment: Needs assistance Sitting-balance support: No upper extremity supported, Feet supported Sitting balance-Leahy Scale: Fair     Standing balance support: Bilateral upper extremity supported, Reliant on assistive device for balance Standing balance-Leahy Scale: Poor Standing balance comment: patient required rolling walker for support to stand with no external support required from therapist. standing tolerance of around 30 seconds                            Cognition Arousal/Alertness: Awake/alert Behavior  During Therapy: WFL for tasks assessed/performed Overall Cognitive Status: Within Functional Limits for tasks assessed                                             General Comments General comments (skin integrity, edema, etc.): pt with orthostatic vitals which cause nausea and dizziness and pt unable to progress to ambulation       Pertinent Vitals/Pain Pain Assessment Pain Assessment: No/denies pain Faces Pain Scale: Hurts a little bit Pain Location: generalized Pain Descriptors / Indicators: Tiring, Grimacing Pain Intervention(s): Limited activity within patient's tolerance, Monitored during session, Repositioned     PT Goals (current goals can now be found in the care plan section) Acute Rehab PT Goals Patient Stated Goal: to go home PT Goal Formulation: With patient Time For Goal Achievement: 02/20/22 Potential to Achieve Goals: Good Progress towards PT goals: Progressing toward goals    Frequency    Min 3X/week      PT Plan Current plan remains appropriate       AM-PAC PT "6 Clicks" Mobility   Outcome Measure  Help needed turning from your back to your side while in a flat bed without using bedrails?: A Little Help needed moving from lying on your back to sitting on the side of a flat bed without using bedrails?: A Little Help needed moving to and from a bed to a chair (including a wheelchair)?: A Little Help needed standing up from a chair using your arms (e.g., wheelchair or bedside chair)?: A Little Help needed to walk in hospital room?: Total Help needed climbing 3-5 steps with a railing? : A Lot 6 Click Score: 15    End of Session Equipment Utilized During Treatment: Gait belt Activity Tolerance: Patient limited by fatigue Patient left: in bed;with call bell/phone within reach;with bed alarm set Nurse Communication: Mobility status;Other (comment) PT Visit Diagnosis: Other abnormalities of gait and mobility (R26.89);Muscle weakness (generalized) (M62.81)     Time: 2751-7001 PT Time Calculation (min) (ACUTE ONLY): 25 min  Charges:  $Therapeutic Activity: 23-37 mins                     Travonta Gill B. Migdalia Dk PT, DPT Acute Rehabilitation Services Please use secure chat or  Call Office [phone]    [place] 02/09/2022, 2:24 PM

## 2022-02-10 DIAGNOSIS — R112 Nausea with vomiting, unspecified: Secondary | ICD-10-CM | POA: Diagnosis not present

## 2022-02-10 LAB — GLUCOSE, CAPILLARY
Glucose-Capillary: 140 mg/dL — ABNORMAL HIGH (ref 70–99)
Glucose-Capillary: 158 mg/dL — ABNORMAL HIGH (ref 70–99)
Glucose-Capillary: 147 mg/dL — ABNORMAL HIGH (ref 70–99)
Glucose-Capillary: 151 mg/dL — ABNORMAL HIGH (ref 70–99)

## 2022-02-10 NOTE — Progress Notes (Signed)
Mobility Specialist - Progress Note   02/10/22 1214  Mobility  Activity Refused mobility   Pt refused mobility d/t currently eating lunch. Will follow up if time permits.   Franki Monte  Mobility Specialist Please contact via S[employer] or Rehab office at [phone]

## 2022-02-10 NOTE — Progress Notes (Signed)
PROGRESS NOTE    [name]  ZOX:[id number] DOB: 1932/02/07 DOA: 02/03/2022 PCP: Antony Contras, MD  Chief Complaint  Patient presents with   Emesis   Diarrhea    Brief Narrative:  89/F with history of DM2, hypertension, AAA, PVD, TIA, cerebral aneurysm status post coiling, GERD, duodenitis, IBS, anxiety.  Admitted last month in early December for new onset [name]-fib with RVR and was started on Coreg and Eliquis.  -Presented to the ED 1/4 with nausea vomiting and diarrhea -In the ED she was noted to be hypertensive, blood pressure 220, mildly hypoxic requiring 2 L O2.  Labs were unremarkable except for mildly elevated high-sensitivity troponin, flu and COVID PCR were negative, CT abdomen pelvis negative for acute findings, chest x-ray noted pulmonary vascular congestion and possible mild interstitial edema. -Complicated by [name]-fib RVR -gastroenteritis improving, nausea persists -Recurrent [name]-fib with RVR, cardiology consulting     Assessment & Plan:   Principal Problem:   Nausea and vomiting Active Problems:   Elevated troponin   Diarrhea   DM (diabetes mellitus) (HCC)   Chest pain   Acute hypoxemic respiratory failure (HCC)   Hypertensive urgency   Paroxysmal atrial fibrillation (HCC)   Acute on chronic heart failure with preserved ejection fraction (HCC)   Atrial fibrillation (HCC)  Nausea/vomiting/diarrhea -Suspect viral gastroenteritis, flu and COVID PCR negative, CT abdomen pelvis without acute findings -Continue supportive care, no further diarrhea or vomiting -Diet as tolerated, improved appetite today -Increase activity, PT eval, patient declines rehab -> sounds like agreeable now   Atrial fibrillation with RVR -Recent echo with preserved EF, required Cardizem drip on admission -Recurrent [name]-fib RVR this admission -I wonder if [name]-fib is contributing to her persistent nausea, appreciate cards input, now on metoprolol, dose reduced to 50 mg BID, now in sinus -continue  Eliquis   Acute on chronic diastolic CHF Mild hypoxia -Likely triggered by [name]-fib, has been taking Lasix 3 times [name] week, chest x-ray with pulmonary vascular congestion and mild edema -Treated with 2 doses of IV Lasix, now euvolemic, hold further diuretics -trend, adjust diuretics as needed -Last echo 12/23 with EF 55-60%, grade 1 DD -Incentive spirometry, weaned off O2    elevated troponin -Likely secondary to demand in the setting of CHF, [name]-fib -Do not suspect ACS   Hypertensive urgency -Systolic as high as [name], now improved  -Continue metoprolol (dose reduced), diuretics as above   Insulin-dependent diabetes -A1c 6.2 on 01/02/2022 -Sensitive sliding scale insulin ACHS -Continue current dose of Lantus    DVT prophylaxis: eliquis Code Status: DNR Family Communication: none Disposition:   Status is: Inpatient Remains inpatient appropriate because: pending further improvemetn   Consultants:  cards  Procedures:  none  Antimicrobials:  Anti-infectives (From admission, onward)    None       Subjective: No complaints  Objective: Vitals:   02/09/22 0450 02/09/22 0845 02/09/22 1502 02/09/22 1710  BP: 128/61 (!) 169/68 (!) 161/66 (!) 143/70  Pulse: (!) 53 (!) 58 (!) 55 (!) 59  Resp: '18  18 18  '$ Temp: 98.4 F (36.9 C)  97.8 F (36.6 C) 98.8 F (37.1 C)  TempSrc: Oral  Oral Oral  SpO2: 94% 91% 95% 95%  Weight:      Height:        Intake/Output Summary (Last 24 hours) at 02/09/2022 1932 Last data filed at 02/09/2022 1700 Gross per 24 hour  Intake --  Output 750 ml  Net -750 ml   Filed Weights   02/06/22 0517  02/07/22 0400 02/08/22 0500  Weight: 65.8 kg 65.2 kg 66.5 kg    Examination:  General: No acute distress. Cardiovascular: RRR Lungs: unlabored Abdomen: Soft, nontender, nondistended  Neurological: Alert and oriented 3. Moves all extremities 4 with equal strength. Cranial nerves II through XII grossly intact. Extremities: No clubbing or  cyanosis. No edema.   Data Reviewed: I have personally reviewed following labs and imaging studies  CBC: Recent Labs  Lab 02/03/22 1420 02/05/22 0228 02/07/22 0157 02/09/22 0128  WBC 6.5 10.9* 9.4 11.7*  NEUTROABS 3.6  --   --   --   HGB 11.7* 11.1* 11.8* 12.4  HCT 36.1 35.0* 37.3 39.7  MCV 75.5* 75.9* 76.7* 76.9*  PLT 188 204 249 035    Basic Metabolic Panel: Recent Labs  Lab 02/04/22 0459 02/05/22 0228 02/06/22 0136 02/07/22 0157 02/09/22 0128  NA 142 141 139 136 136  K 3.3* 4.5 4.7 4.0 4.1  CL 104 108 101 103 105  CO2 '24 26 28 26 23  '$ GLUCOSE 93 112* 93 99 112*  BUN 12 20 27* 26* 26*  CREATININE 0.83 1.11* 0.91 0.94 0.92  CALCIUM 8.6* 9.0 8.9 8.7* 8.5*    GFR: Estimated Creatinine Clearance: 39.4 mL/min (by C-G formula based on SCr of 0.92 mg/dL).  Liver Function Tests: Recent Labs  Lab 02/03/22 1420 02/05/22 0228  AST 49* 53*  ALT 51* 44  ALKPHOS 56 51  BILITOT 0.7 1.0  PROT 6.4* 6.1*  ALBUMIN 3.7 3.4*    CBG: Recent Labs  Lab 02/08/22 1649 02/08/22 2107 02/09/22 0810 02/09/22 1204 02/09/22 1707  GLUCAP 88 239* 128* 200* 148*     Recent Results (from the past 240 hour(s))  Urine Culture     Status: Abnormal   Collection Time: 02/03/22  4:02 PM   Specimen: Urine, Clean Catch  Result Value Ref Range Status   Specimen Description URINE, CLEAN CATCH  Final   Special Requests   Final    NONE Performed at [facility], [place] [address] [place], [place] [address]    Culture MULTIPLE SPECIES PRESENT, SUGGEST RECOLLECTION (Cecilio Ohlrich)  Final   Report Status 02/04/2022 FINAL  Final  Resp panel by RT-PCR (RSV, Flu Betania Dizon&B, Covid) Anterior Nasal Swab     Status: None   Collection Time: 02/03/22  8:05 PM   Specimen: Anterior Nasal Swab  Result Value Ref Range Status   SARS Coronavirus 2 by RT PCR NEGATIVE NEGATIVE Final    Comment: (NOTE) SARS-CoV-2 target nucleic acids are NOT DETECTED.  The SARS-CoV-2 RNA is generally detectable in upper  respiratory specimens during the acute phase of infection. The lowest concentration of SARS-CoV-2 viral copies this assay can detect is 138 copies/mL. [name] negative result does not preclude SARS-Cov-2 infection and should not be used as the sole basis for treatment or other patient management decisions. Lainy Wrobleski negative result may occur with  improper specimen collection/handling, submission of specimen other than nasopharyngeal swab, presence of viral mutation(s) within the areas targeted by this assay, and inadequate number of viral copies(<138 copies/mL). Gustavo Meditz negative result must be combined with clinical observations, patient history, and epidemiological information. The expected result is Negative.  Fact Sheet for Patients:  EntrepreneurPulse.com.au  Fact Sheet for Healthcare Providers:  IncredibleEmployment.be  This test is no t yet approved or cleared by the [place] FDA and  has been authorized for detection and/or diagnosis of SARS-CoV-2 by FDA under an Emergency Use Authorization (EUA). This EUA will remain  in  effect (meaning this test can be used) for the duration of the COVID-19 declaration under Section 564(b)(1) of the Act, 21 U.S.C.section 360bbb-3(b)(1), unless the authorization is terminated  or revoked sooner.       Influenza Sylvana Bonk by PCR NEGATIVE NEGATIVE Final   Influenza B by PCR NEGATIVE NEGATIVE Final    Comment: (NOTE) The Xpert Xpress SARS-CoV-2/FLU/RSV plus assay is intended as an aid in the diagnosis of influenza from Nasopharyngeal swab specimens and should not be used as Teylor Wolven sole basis for treatment. Nasal washings and aspirates are unacceptable for Xpert Xpress SARS-CoV-2/FLU/RSV testing.  Fact Sheet for Patients: EntrepreneurPulse.com.au  Fact Sheet for Healthcare Providers: IncredibleEmployment.be  This test is not yet approved or cleared by the [place] FDA and has been  authorized for detection and/or diagnosis of SARS-CoV-2 by FDA under an Emergency Use Authorization (EUA). This EUA will remain in effect (meaning this test can be used) for the duration of the COVID-19 declaration under Section 564(b)(1) of the Act, 21 U.S.C. section 360bbb-3(b)(1), unless the authorization is terminated or revoked.     Resp Syncytial Virus by PCR NEGATIVE NEGATIVE Final    Comment: (NOTE) Fact Sheet for Patients: EntrepreneurPulse.com.au  Fact Sheet for Healthcare Providers: IncredibleEmployment.be  This test is not yet approved or cleared by the [place] FDA and has been authorized for detection and/or diagnosis of SARS-CoV-2 by FDA under an Emergency Use Authorization (EUA). This EUA will remain in effect (meaning this test can be used) for the duration of the COVID-19 declaration under Section 564(b)(1) of the Act, 21 U.S.C. section 360bbb-3(b)(1), unless the authorization is terminated or revoked.  Performed at [facility], [place] [address] [place], [place] [address]          Radiology Studies: No results found.      Scheduled Meds:  apixaban  5 mg Oral BID   insulin aspart  0-5 Units Subcutaneous QHS   insulin aspart  0-9 Units Subcutaneous TID WC   metoprolol tartrate  50 mg Oral BID   pantoprazole  40 mg Oral Q1200   Continuous Infusions:   LOS: 6 days    Time spent: over 30 min    Fayrene Helper, MD [facility]   To contact the attending provider between 7A-7P or the covering provider during after hours 7P-7A, please log into the web site [email or web address] and access using universal [place] password for that web site. If you do not have the password, please call the hospital operator.  02/09/2022, 7:32 PM

## 2022-02-10 NOTE — Progress Notes (Signed)
Occupational Therapy Treatment Patient Details Name: [name] MRN: [id number] DOB: 03/18/1932 Today's Date: 02/10/2022   History of present illness Pt is an 89 y.o. female admitted 02/03/21 with nausea, vomiting, diarrhea; suspect viral gastroenteritis. Pt also with HTN, afib with RVR. Recent admission 12/2021 for new onset afib with RVR. PMH includes DM2, HTN, AAA, PVD, TIA, cerebral aneurysm s/p coiling, IBS, duodenitis, anxiety.   OT comments  Pt progressing towards OT goals with session focused on full ADL tasks EOB. Pt able to complete bed mobility with Supervision as well as standing transfers with RW during ADLs w/ no complaints on dizziness. Pt requires Setup for UB ADL and Min A for LB ADLs d/t balance/endurance deficits. Pt able to verbalize safety strategies for when dizziness felt at home to decrease fall risk. Pt reports her husband has a 24/7 caregiver now and that this caregiver can also assist her. DC support will need to be confirmed to ensure safe DC with [facility] services per pt preference. Will continue to follow acutely.  BP supine: 158/64 BP sitting: 159/75 BP post activity: 010/932 (diastolic likely not accurate d/t movement)   Recommendations for follow up therapy are one component of a multi-disciplinary discharge planning process, led by the attending physician.  Recommendations may be updated based on patient status, additional functional criteria and insurance authorization.    Follow Up Recommendations  Home health OT (as long as caregiver able to provide consistent hands on assist for ADLs/mobility; noted pt preference to return home rather than SNF rehab)     Assistance Recommended at Discharge Frequent or constant Supervision/Assistance  Patient can return home with the following  A little help with walking and/or transfers;A little help with bathing/dressing/bathroom;Assistance with cooking/housework;Assist for transportation;Help with stairs or ramp for  entrance   Equipment Recommendations  None recommended by OT    Recommendations for Other Services      Precautions / Restrictions Precautions Precautions: Fall Precaution Comments: monitor BP Restrictions Weight Bearing Restrictions: No       Mobility Bed Mobility Overal bed mobility: Needs Assistance Bed Mobility: Supine to Sit, Sit to Supine     Supine to sit: Supervision, HOB elevated Sit to supine: Supervision        Transfers Overall transfer level: Needs assistance Equipment used: Rolling walker (2 wheels) Transfers: Sit to/from Stand Sit to Stand: Supervision                 Balance Overall balance assessment: Needs assistance Sitting-balance support: No upper extremity supported, Feet supported Sitting balance-Leahy Scale: Good     Standing balance support: Bilateral upper extremity supported, Reliant on assistive device for balance Standing balance-Leahy Scale: Poor                             ADL either performed or assessed with clinical judgement   ADL Overall ADL's : Needs assistance/impaired     Grooming: Set up;Sitting;Wash/dry face;Applying deodorant   Upper Body Bathing: Set up;Sitting   Lower Body Bathing: Minimal assistance;Sit to/from stand Lower Body Bathing Details (indicate cue type and reason): assist for lower LE and posterior hygiene in standing Upper Body Dressing : Set up;Sitting   Lower Body Dressing: Minimal assistance;Sit to/from stand Lower Body Dressing Details (indicate cue type and reason): assist for underwear around one foot and around waist as pt reported preference to hold to RW for balance while assisted with task. Educated on alternating one hand on  RW to complete task if balance still off               General ADL Comments: Focus on EOB ADLs per pt preference, as well as education on safety and translation of skills to home    Extremity/Trunk Assessment Upper Extremity Assessment Upper  Extremity Assessment: Generalized weakness   Lower Extremity Assessment Lower Extremity Assessment: Defer to PT evaluation        Vision   Vision Assessment?: No apparent visual deficits   Perception     Praxis      Cognition Arousal/Alertness: Awake/alert Behavior During Therapy: WFL for tasks assessed/performed Overall Cognitive Status: Within Functional Limits for tasks assessed                                 General Comments: WFL  for simple tasks assessed, would benefit from higher level assessment        Exercises      Shoulder Instructions       General Comments      Pertinent Vitals/ Pain       Pain Assessment Pain Assessment: No/denies pain  Home Living                                          Prior Functioning/Environment              Frequency  Min 2X/week        Progress Toward Goals  OT Goals(current goals can now be found in the care plan section)  Progress towards OT goals: Progressing toward goals  Acute Rehab OT Goals Patient Stated Goal: go home to be with my husband OT Goal Formulation: With patient Time For Goal Achievement: 02/21/22 Potential to Achieve Goals: Good ADL Goals Pt Will Perform Lower Body Bathing: sit to/from stand;with supervision Pt Will Perform Lower Body Dressing: sit to/from stand;with supervision Pt Will Transfer to Toilet: with supervision;ambulating;regular height toilet Additional ADL Goal #1: pt will indep verbalize at least 3 energy conservation strategies to increase safety at d/c  Plan Discharge plan needs to be updated    Co-evaluation                 AM-PAC OT "6 Clicks" Daily Activity     Outcome Measure   Help from another person eating meals?: None Help from another person taking care of personal grooming?: A Little Help from another person toileting, which includes using toliet, bedpan, or urinal?: A Little Help from another person bathing  (including washing, rinsing, drying)?: A Little Help from another person to put on and taking off regular upper body clothing?: A Little Help from another person to put on and taking off regular lower body clothing?: A Little 6 Click Score: 19    End of Session Equipment Utilized During Treatment: Rolling walker (2 wheels)  OT Visit Diagnosis: Unsteadiness on feet (R26.81);Muscle weakness (generalized) (M62.81)   Activity Tolerance Patient tolerated treatment well   Patient Left in bed;with call bell/phone within reach;with bed alarm set   Nurse Communication Mobility status        Time: 7001-7494 OT Time Calculation (min): 27 min  Charges: OT General Charges $OT Visit: 1 Visit OT Treatments $Self Care/Home Management : 23-37 mins  Malachy Chamber, OTR/L Acute Rehab Services Office: [phone]   Almyra Free  Richrd Kuzniar 02/10/2022, 11:55 AM

## 2022-02-10 NOTE — TOC Progression Note (Signed)
Transition of Care [facility]) - Progression Note    Patient Details  Name: [name] MRN: [id number] Date of Birth: 1932-11-07  [facility] CM/SW Contact  Graves-Bigelow, Ocie Cornfield, RN Phone Number: 02/10/2022, 4:37 PM  Clinical Narrative:  Case Manager spoke with the patient regarding disposition needs. Patient wants to return home with spouse. Patient initially reported the personal caregiver is for her spouse only and caregiver visits Monday-Friday. Patient reports that she cares for the spouse over the weekends. Patient provided Case Manager verbal permission to call stepdaughter Jackelyn Poling [phone] and Jake Samples Niece @ [phone]. Debbie clarified that the caregiver is for the patient and her spouse. Jackelyn Poling is working on 24 hour supervision Monday-Friday; however, she has not arranged any weekend assistance at this time. Personal Caregiver is from an agency owned by [employer]. Helene Kelp called the patient discussed disposition plans with the patient and the patient is now agreeable to SNF. Jackelyn Poling feels that since the patient is not ambulating she will need some rehab prior to returning home with the caregivers. Patient verbalized that she knows needs additional assistance and rehab before going home. She states she was really wanting to see her husband. CSW made aware that the plan will now be for SNF. PT/OT will have to see the patient for new recommendations. Case Manager will continue to follow for transition of care needs.    Expected Discharge Plan: [place] Barriers to Discharge: Continued Medical Work up  Expected Discharge Plan and Services In-house Referral: Clinical Social Work     Living arrangements for the past 2 months: Single Family Home   Social Determinants of Health (SDOH) Interventions SDOH Screenings   Food Insecurity: No Food Insecurity (02/05/2022)  Housing: Low Risk  (02/05/2022)  Transportation Needs: No Transportation  Needs (02/05/2022)  Utilities: Not At Risk (02/05/2022)  Tobacco Use: Low Risk  (02/03/2022)    Readmission Risk Interventions     No data to display

## 2022-02-11 DIAGNOSIS — R112 Nausea with vomiting, unspecified: Secondary | ICD-10-CM | POA: Diagnosis not present

## 2022-02-11 LAB — CBC
HCT: 36.5 % (ref 36.0–46.0)
Hemoglobin: 11.4 g/dL — ABNORMAL LOW (ref 12.0–15.0)
MCH: 24.4 pg — ABNORMAL LOW (ref 26.0–34.0)
MCHC: 31.2 g/dL (ref 30.0–36.0)
MCV: 78 fL — ABNORMAL LOW (ref 80.0–100.0)
Platelets: 233 10*3/uL (ref 150–400)
RBC: 4.68 MIL/uL (ref 3.87–5.11)
RDW: 17.2 % — ABNORMAL HIGH (ref 11.5–15.5)
WBC: 11.2 10*3/uL — ABNORMAL HIGH (ref 4.0–10.5)
nRBC: 0 % (ref 0.0–0.2)

## 2022-02-11 LAB — GLUCOSE, CAPILLARY
Glucose-Capillary: 150 mg/dL — ABNORMAL HIGH (ref 70–99)
Glucose-Capillary: 175 mg/dL — ABNORMAL HIGH (ref 70–99)
Glucose-Capillary: 123 mg/dL — ABNORMAL HIGH (ref 70–99)
Glucose-Capillary: 106 mg/dL — ABNORMAL HIGH (ref 70–99)
Glucose-Capillary: 194 mg/dL — ABNORMAL HIGH (ref 70–99)

## 2022-02-11 LAB — BASIC METABOLIC PANEL
Anion gap: 9 (ref 5–15)
BUN: 33 mg/dL — ABNORMAL HIGH (ref 8–23)
CO2: 25 mmol/L (ref 22–32)
Calcium: 8.7 mg/dL — ABNORMAL LOW (ref 8.9–10.3)
Chloride: 104 mmol/L (ref 98–111)
Creatinine, Ser: 1.02 mg/dL — ABNORMAL HIGH (ref 0.44–1.00)
GFR, Estimated: 53 mL/min — ABNORMAL LOW (ref >60)
Glucose, Bld: 126 mg/dL — ABNORMAL HIGH (ref 70–99)
Potassium: 4.4 mmol/L (ref 3.5–5.1)
Sodium: 138 mmol/L (ref 135–145)

## 2022-02-11 MED ORDER — ONDANSETRON HCL 4 MG PO TABS
4.0000 mg | ORAL_TABLET | Freq: Three times a day (TID) | ORAL | Status: DC | PRN
  Administered 2022-02-11 – 2022-02-18 (×6): 4 mg via ORAL
  Filled 2022-02-11 (×6): qty 1

## 2022-02-11 MED ORDER — ONDANSETRON 4 MG PO TBDP
4.0000 mg | ORAL_TABLET | Freq: Three times a day (TID) | ORAL | Status: DC | PRN
  Filled 2022-02-11: qty 1

## 2022-02-11 MED ORDER — METOPROLOL TARTRATE 25 MG PO TABS
25.0000 mg | ORAL_TABLET | Freq: Two times a day (BID) | ORAL | Status: DC
  Administered 2022-02-11 – 2022-02-12 (×2): 25 mg via ORAL
  Filled 2022-02-11 (×2): qty 1

## 2022-02-11 NOTE — TOC Progression Note (Addendum)
Transition of Care [facility]) - Progression Note    Patient Details  Name: [name] MRN: [id number] Date of Birth: 1932-10-13  Transition of [facility]) CM/SW [place], [place] Phone Number: 02/11/2022, 9:58 AM  Clinical Narrative:     Update- CSW received message from CM patients niece Helene Kelp wanting phone call from [place] on update of SNF placement for patient. Patient gave Permission for CSW to give Helene Kelp a call. CSW called Helene Kelp and LVM. CSW awaiting call back. Update- CSW received voicemail from patients niece Helene Kelp. CSW gave Helene Kelp call back and updated patients niece on SNF placement for patient.    Update- 12:58pm- CSW checked patients insurance authorization . Patients insurance authorization currently pending.   Update- CSW started insurance authorization for patient. [place] ID# [id number].  CSW informed by CM patient now in agreement to SNF placement. CSW spoke with patient at bedside who confirmed she is in agreement with SNF placement. CSW provided patient with SNF bed offers. Patient chose SNF placement with [facility] as number one choice. Second choice is [facility]. CSW explained to patient insurance authorization process. All questions answered. No further questions reported at this time. CSW called Kitty with [facility] and LVM. CSW awaiting call back to confirm SNF bed for patient. CSW will start insurance authorization for patient. Passr number approved added to patients [email or web address].CSW will continue to follow and assist with patients dc planning needs.   Expected Discharge Plan: [place] Barriers to Discharge: Continued Medical Work up  Expected Discharge Plan and Services In-house Referral: Clinical Social Work     Living arrangements for the past 2 months: Single Family Home                                       Social Determinants of Health (SDOH) Interventions SDOH Screenings   Food Insecurity: No Food Insecurity  (02/05/2022)  Housing: Low Risk  (02/05/2022)  Transportation Needs: No Transportation Needs (02/05/2022)  Utilities: Not At Risk (02/05/2022)  Tobacco Use: Low Risk  (02/03/2022)    Readmission Risk Interventions     No data to display

## 2022-02-11 NOTE — Progress Notes (Signed)
PROGRESS NOTE    [name]  NKN:[id number] DOB: [birth year] DOA: 02/03/2022 PCP: Antony Contras, MD  Chief Complaint  Patient presents with   Emesis   Diarrhea    Brief Narrative:  89/F with history of DM2, hypertension, AAA, PVD, TIA, cerebral aneurysm status post coiling, GERD, duodenitis, IBS, anxiety.  Admitted last month in early December for new onset Saidee Geremia-fib with RVR and was started on Coreg and Eliquis.  -Presented to the ED 1/4 with nausea vomiting and diarrhea -In the ED she was noted to be hypertensive, blood pressure 220, mildly hypoxic requiring 2 L O2.  Labs were unremarkable except for mildly elevated high-sensitivity troponin, flu and COVID PCR were negative, CT abdomen pelvis negative for acute findings, chest x-ray noted pulmonary vascular congestion and possible mild interstitial edema. -Complicated by Alondra Sahni-fib RVR -gastroenteritis improving, nausea persists -Recurrent Jashaun Penrose-fib with RVR, cardiology consulting   Assessment & Plan:   Principal Problem:   Nausea and vomiting Active Problems:   Elevated troponin   Diarrhea   DM (diabetes mellitus) (HCC)   Chest pain   Acute hypoxemic respiratory failure (HCC)   Hypertensive urgency   Paroxysmal atrial fibrillation (HCC)   Acute on chronic heart failure with preserved ejection fraction (HCC)   Atrial fibrillation (HCC)  Nausea/vomiting/diarrhea -Suspect viral gastroenteritis, flu and COVID PCR negative, CT abdomen pelvis without acute findings -Continue supportive care, no further diarrhea or vomiting -Diet as tolerated, improved appetite today -Increase activity, PT eval, patient declines rehab -> sounds like agreeable now -nausea again today, I think this was related to orthostasis, will follow   Orthostasis - LH and nausea after working with therapy  - decrease metoprolol dose with bradycardia - follow orthostatics, consider ted hose, abdominal binder  Atrial fibrillation with RVR -Recent echo with preserved  EF, required Cardizem drip on admission -Recurrent Ricquel Foulk-fib RVR this admission -I wonder if Izeyah Deike-fib is contributing to her persistent nausea, appreciate cards input, now on metoprolol, dose reduced to 25 mg BID, now in sinus -continue Eliquis   Acute on chronic diastolic CHF Mild hypoxia -Likely triggered by Wells Gerdeman-fib, has been taking Lasix 3 times Arthea Nobel week, chest x-ray with pulmonary vascular congestion and mild edema -Treated with 2 doses of IV Lasix, now euvolemic, hold further diuretics -trend, adjust diuretics as needed -Last echo 12/23 with EF 55-60%, grade 1 DD -Incentive spirometry, weaned off O2    elevated troponin -Likely secondary to demand in the setting of CHF, Rizwan Kuyper-fib -Do not suspect ACS   Hypertensive urgency -Systolic as high as 379, now improved  -Continue metoprolol (dose reduced), diuretics as above   Insulin-dependent diabetes -A1c 6.2 on 01/02/2022 -Sensitive sliding scale insulin ACHS -Continue current dose of Lantus    DVT prophylaxis: eliquis Code Status: DNR Family Communication: none Disposition:   Status is: Inpatient Remains inpatient appropriate because: pending further improvemetn   Consultants:  cards  Procedures:  none  Antimicrobials:  Anti-infectives (From admission, onward)    None       Subjective: C/o nausea after working with therapy Got LH as well  Objective: Vitals:   02/10/22 0951 02/10/22 2043 02/11/22 0401 02/11/22 0757  BP: (!) 180/67 (!) 137/96 (!) 179/66 (!) 167/64  Pulse: 62 60 (!) 55 (!) 48  Resp: '18 18 18 18  '$ Temp: (!) 97.5 F (36.4 C) 98.4 F (36.9 C) 98.3 F (36.8 C) 97.6 F (36.4 C)  TempSrc: Oral Oral Oral Oral  SpO2: 96% 97% 96% 94%  Weight:   63.9 kg  Height:        Intake/Output Summary (Last 24 hours) at 02/11/2022 1646 Last data filed at 02/11/2022 0750 Gross per 24 hour  Intake --  Output 1400 ml  Net -1400 ml   Filed Weights   02/08/22 0500 02/10/22 0415 02/11/22 0401  Weight: 66.5 kg 66 kg  63.9 kg    Examination:  General: No acute distress. Cardiovascular:RRR Lungs: unlabored Abdomen: Soft, nontender, nondistended  Neurological: Alert and oriented 3. Moves all extremities 4 with equal strength. Cranial nerves II through XII grossly intact. Extremities: No clubbing or cyanosis. No edema.   Data Reviewed: I have personally reviewed following labs and imaging studies  CBC: Recent Labs  Lab 02/05/22 0228 02/07/22 0157 02/09/22 0128 02/11/22 0224  WBC 10.9* 9.4 11.7* 11.2*  HGB 11.1* 11.8* 12.4 11.4*  HCT 35.0* 37.3 39.7 36.5  MCV 75.9* 76.7* 76.9* 78.0*  PLT 204 249 270 542    Basic Metabolic Panel: Recent Labs  Lab 02/05/22 0228 02/06/22 0136 02/07/22 0157 02/09/22 0128 02/11/22 0224  NA 141 139 136 136 138  K 4.5 4.7 4.0 4.1 4.4  CL 108 101 103 105 104  CO2 '26 28 26 23 25  '$ GLUCOSE 112* 93 99 112* 126*  BUN 20 27* 26* 26* 33*  CREATININE 1.11* 0.91 0.94 0.92 1.02*  CALCIUM 9.0 8.9 8.7* 8.5* 8.7*    GFR: Estimated Creatinine Clearance: 35 mL/min (Naeemah Jasmer) (by C-G formula based on SCr of 1.02 mg/dL (H)).  Liver Function Tests: Recent Labs  Lab 02/05/22 0228  AST 53*  ALT 44  ALKPHOS 51  BILITOT 1.0  PROT 6.1*  ALBUMIN 3.4*    CBG: Recent Labs  Lab 02/10/22 1532 02/10/22 2149 02/11/22 0800 02/11/22 1221 02/11/22 1510  GLUCAP 147* 151* 150* 175* 123*     Recent Results (from the past 240 hour(s))  Urine Culture     Status: Abnormal   Collection Time: 02/03/22  4:02 PM   Specimen: Urine, Clean Catch  Result Value Ref Range Status   Specimen Description URINE, CLEAN CATCH  Final   Special Requests   Final    NONE Performed at [facility], [place] [address], [place], [place] [address]    Culture MULTIPLE SPECIES PRESENT, SUGGEST RECOLLECTION (Kealii Thueson)  Final   Report Status 02/04/2022 FINAL  Final  Resp panel by RT-PCR (RSV, Flu Emberlie Gotcher&B, Covid) Anterior Nasal Swab     Status: None   Collection Time: 02/03/22  8:05 PM   Specimen:  Anterior Nasal Swab  Result Value Ref Range Status   SARS Coronavirus 2 by RT PCR NEGATIVE NEGATIVE Final    Comment: (NOTE) SARS-CoV-2 target nucleic acids are NOT DETECTED.  The SARS-CoV-2 RNA is generally detectable in upper respiratory specimens during the acute phase of infection. The lowest concentration of SARS-CoV-2 viral copies this assay can detect is 138 copies/mL. Katalyn Matin negative result does not preclude SARS-Cov-2 infection and should not be used as the sole basis for treatment or other patient management decisions. Jasraj Lappe negative result may occur with  improper specimen collection/handling, submission of specimen other than nasopharyngeal swab, presence of viral mutation(s) within the areas targeted by this assay, and inadequate number of viral copies(<138 copies/mL). Jennilyn Esteve negative result must be combined with clinical observations, patient history, and epidemiological information. The expected result is Negative.  Fact Sheet for Patients:  EntrepreneurPulse.com.au  Fact Sheet for Healthcare Providers:  IncredibleEmployment.be  This test is no t yet approved or cleared by the [place]  FDA and  has been authorized for detection and/or diagnosis of SARS-CoV-2 by FDA under an Emergency Use Authorization (EUA). This EUA will remain  in effect (meaning this test can be used) for the duration of the COVID-19 declaration under Section 564(b)(1) of the Act, 21 U.S.C.section 360bbb-3(b)(1), unless the authorization is terminated  or revoked sooner.       Influenza Ninette Cotta by PCR NEGATIVE NEGATIVE Final   Influenza B by PCR NEGATIVE NEGATIVE Final    Comment: (NOTE) The Xpert Xpress SARS-CoV-2/FLU/RSV plus assay is intended as an aid in the diagnosis of influenza from Nasopharyngeal swab specimens and should not be used as Jayquan Bradsher sole basis for treatment. Nasal washings and aspirates are unacceptable for Xpert Xpress SARS-CoV-2/FLU/RSV testing.  Fact  Sheet for Patients: EntrepreneurPulse.com.au  Fact Sheet for Healthcare Providers: IncredibleEmployment.be  This test is not yet approved or cleared by the [place] FDA and has been authorized for detection and/or diagnosis of SARS-CoV-2 by FDA under an Emergency Use Authorization (EUA). This EUA will remain in effect (meaning this test can be used) for the duration of the COVID-19 declaration under Section 564(b)(1) of the Act, 21 U.S.C. section 360bbb-3(b)(1), unless the authorization is terminated or revoked.     Resp Syncytial Virus by PCR NEGATIVE NEGATIVE Final    Comment: (NOTE) Fact Sheet for Patients: EntrepreneurPulse.com.au  Fact Sheet for Healthcare Providers: IncredibleEmployment.be  This test is not yet approved or cleared by the [place] FDA and has been authorized for detection and/or diagnosis of SARS-CoV-2 by FDA under an Emergency Use Authorization (EUA). This EUA will remain in effect (meaning this test can be used) for the duration of the COVID-19 declaration under Section 564(b)(1) of the Act, 21 U.S.C. section 360bbb-3(b)(1), unless the authorization is terminated or revoked.  Performed at [facility], [place] [address], [place], [place] [address]          Radiology Studies: No results found.      Scheduled Meds:  apixaban  5 mg Oral BID   insulin aspart  0-5 Units Subcutaneous QHS   insulin aspart  0-9 Units Subcutaneous TID WC   metoprolol tartrate  50 mg Oral BID   pantoprazole  40 mg Oral Q1200   Continuous Infusions:   LOS: 8 days    Time spent: over 30 min    Fayrene Helper, MD [facility]   To contact the attending provider between 7A-7P or the covering provider during after hours 7P-7A, please log into the web site [email or web address] and access using universal [place] password for that web site. If you do not have the password,  please call the hospital operator.  02/11/2022, 4:46 PM

## 2022-02-11 NOTE — Progress Notes (Signed)
Physical Therapy Treatment Patient Details Name: [name] MRN: [id number] DOB: [birth year] Today's Date: 02/11/2022   History of Present Illness Pt is an 89 y.o. female admitted 02/03/21 with nausea, vomiting, diarrhea; suspect viral gastroenteritis. Pt also with HTN, afib with RVR. Recent admission 12/2021 for new onset afib with RVR. PMH includes DM2, HTN, AAA, PVD, TIA, cerebral aneurysm s/p coiling, IBS, duodenitis, anxiety.    PT Comments    Pt asleep on entry HR 47 bpm, wakes easily and agreeable to therapy. Started with bed level exercise of LE and as pt performing hip exercises report increase in nausea which she states she has had all morning. Pt request end of exercise and notify RN of nausea and request for anti-nausea medication. PT changing recommendation due to pt in ability to walk in room due to initially low BP and now nausea, in presence of increased weakness. PT will continue to follow acutely.    Recommendations for follow up therapy are one component of a multi-disciplinary discharge planning process, led by the attending physician.  Recommendations may be updated based on patient status, additional functional criteria and insurance authorization.  Follow Up Recommendations  Skilled nursing-short term rehab (<3 hours/day) Can patient physically be transported by private vehicle: Yes   Assistance Recommended at Discharge Frequent or constant Supervision/Assistance (assist for all out of bed)  Patient can return home with the following A little help with walking and/or transfers;A little help with bathing/dressing/bathroom;Assistance with cooking/housework;Assist for transportation;Help with stairs or ramp for entrance   Equipment Recommendations  BSC/3in1 (report she has cane and RW)       Precautions / Restrictions Precautions Precautions: Fall Precaution Comments: monitor BP Restrictions Weight Bearing Restrictions: No     Mobility  Bed Mobility Overal bed  mobility: Needs Assistance             General bed mobility comments: with pt in Trendelenberg she is able to scoot herself to HoB without outside assistance, deferred coming to EoB due to nausea                       Cognition Arousal/Alertness: Awake/alert Behavior During Therapy: WFL for tasks assessed/performed Overall Cognitive Status: Within Functional Limits for tasks assessed                                          Exercises General Exercises - Lower Extremity Ankle Circles/Pumps: AROM, Both, 10 reps Quad Sets: AROM, Both, 10 reps, Supine Heel Slides: AROM, Both, 10 reps, Supine Hip ABduction/ADduction: AROM, Both, 10 reps, Supine Straight Leg Raises: AROM, Both, 5 reps, Supine    General Comments General comments (skin integrity, edema, etc.): Pt with c/o nausea, asking for medication, RN notified, HR 47 bpm sleeping on entry, BP 154/49, SpO2 96%O2 on RA      Pertinent Vitals/Pain Pain Assessment Pain Assessment: Faces Faces Pain Scale: Hurts a little bit Pain Location: generalized Pain Descriptors / Indicators: Tiring, Grimacing Pain Intervention(s): Limited activity within patient's tolerance, Monitored during session     PT Goals (current goals can now be found in the care plan section) Acute Rehab PT Goals Patient Stated Goal: to go home PT Goal Formulation: With patient Time For Goal Achievement: 02/20/22 Potential to Achieve Goals: Good Progress towards PT goals: Not progressing toward goals - comment    Frequency    Min 2X/week  PT Plan Current plan remains appropriate;Discharge plan needs to be updated       AM-PAC PT "6 Clicks" Mobility   Outcome Measure  Help needed turning from your back to your side while in a flat bed without using bedrails?: A Little Help needed moving from lying on your back to sitting on the side of a flat bed without using bedrails?: A Little Help needed moving to and from a bed  to a chair (including a wheelchair)?: A Little Help needed standing up from a chair using your arms (e.g., wheelchair or bedside chair)?: A Little Help needed to walk in hospital room?: Total Help needed climbing 3-5 steps with a railing? : A Lot 6 Click Score: 15    End of Session   Activity Tolerance: Treatment limited secondary to medical complications (Comment) (nausea) Patient left: in bed;with call bell/phone within reach;with bed alarm set Nurse Communication: Mobility status;Other (comment) (nausea) PT Visit Diagnosis: Other abnormalities of gait and mobility (R26.89);Muscle weakness (generalized) (M62.81)     Time: 6384-6659 PT Time Calculation (min) (ACUTE ONLY): 10 min  Charges:  $Therapeutic Exercise: 8-22 mins                     Reyonna Haack B. Migdalia Dk PT, DPT Acute Rehabilitation Services Please use secure chat or  Call Office [phone]    [place] 02/11/2022, 10:59 AM

## 2022-02-12 DIAGNOSIS — R112 Nausea with vomiting, unspecified: Secondary | ICD-10-CM | POA: Diagnosis not present

## 2022-02-12 LAB — CBC
HCT: 38.1 % (ref 36.0–46.0)
Hemoglobin: 12 g/dL (ref 12.0–15.0)
MCH: 24.5 pg — ABNORMAL LOW (ref 26.0–34.0)
MCHC: 31.5 g/dL (ref 30.0–36.0)
MCV: 77.9 fL — ABNORMAL LOW (ref 80.0–100.0)
Platelets: 246 10*3/uL (ref 150–400)
RBC: 4.89 MIL/uL (ref 3.87–5.11)
RDW: 17.3 % — ABNORMAL HIGH (ref 11.5–15.5)
WBC: 8.8 10*3/uL (ref 4.0–10.5)
nRBC: 0 % (ref 0.0–0.2)

## 2022-02-12 LAB — BASIC METABOLIC PANEL
Anion gap: 10 (ref 5–15)
BUN: 32 mg/dL — ABNORMAL HIGH (ref 8–23)
CO2: 24 mmol/L (ref 22–32)
Calcium: 8.7 mg/dL — ABNORMAL LOW (ref 8.9–10.3)
Chloride: 102 mmol/L (ref 98–111)
Creatinine, Ser: 0.98 mg/dL (ref 0.44–1.00)
GFR, Estimated: 56 mL/min — ABNORMAL LOW (ref >60)
Glucose, Bld: 246 mg/dL — ABNORMAL HIGH (ref 70–99)
Potassium: 4.3 mmol/L (ref 3.5–5.1)
Sodium: 136 mmol/L (ref 135–145)

## 2022-02-12 LAB — GLUCOSE, CAPILLARY
Glucose-Capillary: 127 mg/dL — ABNORMAL HIGH (ref 70–99)
Glucose-Capillary: 160 mg/dL — ABNORMAL HIGH (ref 70–99)
Glucose-Capillary: 107 mg/dL — ABNORMAL HIGH (ref 70–99)
Glucose-Capillary: 166 mg/dL — ABNORMAL HIGH (ref 70–99)

## 2022-02-12 MED ORDER — METOPROLOL TARTRATE 12.5 MG HALF TABLET
12.5000 mg | ORAL_TABLET | Freq: Two times a day (BID) | ORAL | Status: DC
  Administered 2022-02-12 – 2022-02-15 (×6): 12.5 mg via ORAL
  Filled 2022-02-12 (×6): qty 1

## 2022-02-12 NOTE — Progress Notes (Signed)
PROGRESS NOTE    [name]  TOI:[id number] DOB: February 24, 1932 DOA: 02/03/2022 PCP: Hailey Contras, MD  Chief Complaint  Patient presents with   Emesis   Diarrhea    Brief Narrative:  89/F with history of DM2, hypertension, AAA, PVD, TIA, cerebral aneurysm status post coiling, GERD, duodenitis, IBS, anxiety.  Admitted last month in early December for new onset [name]-fib with RVR and was started on Coreg and Eliquis.  -Presented to the ED 1/4 with nausea vomiting and diarrhea -In the ED she was noted to be hypertensive, blood pressure 220, mildly hypoxic requiring 2 L O2.  Labs were unremarkable except for mildly elevated high-sensitivity troponin, flu and COVID PCR were negative, CT abdomen pelvis negative for acute findings, chest x-ray noted pulmonary vascular congestion and possible mild interstitial edema. -Complicated by [name]-fib RVR -gastroenteritis improving, nausea persists -Recurrent [name]-fib with RVR, cardiology consulting   Assessment & Plan:   Principal Problem:   Nausea and vomiting Active Problems:   Elevated troponin   Diarrhea   DM (diabetes mellitus) (HCC)   Chest pain   Acute hypoxemic respiratory failure (HCC)   Hypertensive urgency   Paroxysmal atrial fibrillation (HCC)   Acute on chronic heart failure with preserved ejection fraction (HCC)   Atrial fibrillation (HCC)  Nausea/vomiting/diarrhea -Suspect viral gastroenteritis, flu and COVID PCR negative, CT abdomen pelvis without acute findings -Continue supportive care, no further diarrhea or vomiting -Diet as tolerated, improved appetite today -Increase activity, PT eval, patient declines rehab -> sounds like agreeable now -improved today, will follow   Orthostasis - LH and nausea after working with therapy  - decrease metoprolol dose with bradycardia (12.5 mg BID for now) - follow orthostatics, consider ted hose, abdominal binder  Atrial fibrillation with RVR -Recent echo with preserved EF, required Cardizem  drip on admission -Recurrent [name]-fib RVR this admission -I wonder if [name]-fib is contributing to her persistent nausea, appreciate cards input, now on metoprolol, dose reduced to 12.5 mg BID, now in sinus -continue Eliquis   Acute on chronic diastolic CHF Mild hypoxia -Likely triggered by [name]-fib, has been taking Lasix 3 times Kayce  week, chest x-ray with pulmonary vascular congestion and mild edema -Treated with 2 doses of IV Lasix, now euvolemic, hold further diuretics -trend, adjust diuretics as needed -Last echo 12/23 with EF 55-60%, grade 1 DD -Incentive spirometry, weaned off O2    elevated troponin -Likely secondary to demand in the setting of CHF, [name]-fib -Do not suspect ACS   Hypertensive urgency -Systolic as high as 833, now improved  -Continue metoprolol (dose reduced), diuretics as above   Insulin-dependent diabetes -A1c 6.2 on 01/02/2022 -Sensitive sliding scale insulin ACHS -Continue current dose of Lantus    DVT prophylaxis: eliquis Code Status: DNR Family Communication: none Disposition:   Status is: Inpatient Remains inpatient appropriate because: pending further improvemetn   Consultants:  cards  Procedures:  none  Antimicrobials:  Anti-infectives (From admission, onward)    None       Subjective: No complaints today  Objective: Vitals:   02/12/22 0538 02/12/22 0751 02/12/22 0851 02/12/22 1500  BP: (!) 156/47 (!) 155/59  (!) 146/65  Pulse: (!) 54 (!) 52 66 (!) 54  Resp: '16 15  16  '$ Temp: 97.6 F (36.4 C) 98.2 F (36.8 C)  98.2 F (36.8 C)  TempSrc: Oral Oral  Oral  SpO2: 97% 99%  93%  Weight: 65 kg     Height:        Intake/Output Summary (Last 24  hours) at 02/12/2022 1914 Last data filed at 02/12/2022 1300 Gross per 24 hour  Intake 477 ml  Output 720 ml  Net -243 ml   Filed Weights   02/10/22 0415 02/11/22 0401 02/12/22 0538  Weight: 66 kg 63.9 kg 65 kg    Examination:  General: No acute distress. Cardiovascular: RRR, brady Lungs:  unlabored Abdomen: Soft, nontender, nondistended Neurological: Alert and oriented 3. Moves all extremities 4 with equal strength. Cranial nerves II through XII grossly intact. Extremities: No clubbing or cyanosis. No edema.   Data Reviewed: I have personally reviewed following labs and imaging studies  CBC: Recent Labs  Lab 02/07/22 0157 02/09/22 0128 02/11/22 0224 02/12/22 0926  WBC 9.4 11.7* 11.2* 8.8  HGB 11.8* 12.4 11.4* 12.0  HCT 37.3 39.7 36.5 38.1  MCV 76.7* 76.9* 78.0* 77.9*  PLT 249 270 233 182    Basic Metabolic Panel: Recent Labs  Lab 02/06/22 0136 02/07/22 0157 02/09/22 0128 02/11/22 0224 02/12/22 0926  NA 139 136 136 138 136  K 4.7 4.0 4.1 4.4 4.3  CL 101 103 105 104 102  CO2 '28 26 23 25 24  '$ GLUCOSE 93 99 112* 126* 246*  BUN 27* 26* 26* 33* 32*  CREATININE 0.91 0.94 0.92 1.02* 0.98  CALCIUM 8.9 8.7* 8.5* 8.7* 8.7*    GFR: Estimated Creatinine Clearance: 36.6 mL/min (by C-G formula based on SCr of 0.98 mg/dL).  Liver Function Tests: No results for input(s): "AST", "ALT", "ALKPHOS", "BILITOT", "PROT", "ALBUMIN" in the last 168 hours.   CBG: Recent Labs  Lab 02/11/22 1712 02/11/22 2218 02/12/22 0750 02/12/22 1150 02/12/22 1636  GLUCAP 106* 194* 127* 160* 107*     Recent Results (from the past 240 hour(s))  Urine Culture     Status: Abnormal   Collection Time: 02/03/22  4:02 PM   Specimen: Urine, Clean Catch  Result Value Ref Range Status   Specimen Description URINE, CLEAN CATCH  Final   Special Requests   Final    NONE Performed at [facility], [place] [address] [place], [place] [address]    Culture MULTIPLE SPECIES PRESENT, SUGGEST RECOLLECTION ([name])  Final   Report Status 02/04/2022 FINAL  Final  Resp panel by RT-PCR (RSV, Flu Veto Macqueen&B, Covid) Anterior Nasal Swab     Status: None   Collection Time: 02/03/22  8:05 PM   Specimen: Anterior Nasal Swab  Result Value Ref Range Status   SARS Coronavirus 2 by RT PCR NEGATIVE NEGATIVE  Final    Comment: (NOTE) SARS-CoV-2 target nucleic acids are NOT DETECTED.  The SARS-CoV-2 RNA is generally detectable in upper respiratory specimens during the acute phase of infection. The lowest concentration of SARS-CoV-2 viral copies this assay can detect is 138 copies/mL. Alfredo Spong negative result does not preclude SARS-Cov-2 infection and should not be used as the sole basis for treatment or other patient management decisions. Karne Ozga negative result may occur with  improper specimen collection/handling, submission of specimen other than nasopharyngeal swab, presence of viral mutation(s) within the areas targeted by this assay, and inadequate number of viral copies(<138 copies/mL). Trea Latner negative result must be combined with clinical observations, patient history, and epidemiological information. The expected result is Negative.  Fact Sheet for Patients:  EntrepreneurPulse.com.au  Fact Sheet for Healthcare Providers:  IncredibleEmployment.be  This test is no t yet approved or cleared by the [place] FDA and  has been authorized for detection and/or diagnosis of SARS-CoV-2 by FDA under an Emergency Use Authorization (EUA). This EUA  will remain  in effect (meaning this test can be used) for the duration of the COVID-19 declaration under Section 564(b)(1) of the Act, 21 U.S.C.section 360bbb-3(b)(1), unless the authorization is terminated  or revoked sooner.       Influenza Broxton Broady by PCR NEGATIVE NEGATIVE Final   Influenza B by PCR NEGATIVE NEGATIVE Final    Comment: (NOTE) The Xpert Xpress SARS-CoV-2/FLU/RSV plus assay is intended as an aid in the diagnosis of influenza from Nasopharyngeal swab specimens and should not be used as Saketh Daubert sole basis for treatment. Nasal washings and aspirates are unacceptable for Xpert Xpress SARS-CoV-2/FLU/RSV testing.  Fact Sheet for Patients: EntrepreneurPulse.com.au  Fact Sheet for Healthcare  Providers: IncredibleEmployment.be  This test is not yet approved or cleared by the [place] FDA and has been authorized for detection and/or diagnosis of SARS-CoV-2 by FDA under an Emergency Use Authorization (EUA). This EUA will remain in effect (meaning this test can be used) for the duration of the COVID-19 declaration under Section 564(b)(1) of the Act, 21 U.S.C. section 360bbb-3(b)(1), unless the authorization is terminated or revoked.     Resp Syncytial Virus by PCR NEGATIVE NEGATIVE Final    Comment: (NOTE) Fact Sheet for Patients: EntrepreneurPulse.com.au  Fact Sheet for Healthcare Providers: IncredibleEmployment.be  This test is not yet approved or cleared by the [place] FDA and has been authorized for detection and/or diagnosis of SARS-CoV-2 by FDA under an Emergency Use Authorization (EUA). This EUA will remain in effect (meaning this test can be used) for the duration of the COVID-19 declaration under Section 564(b)(1) of the Act, 21 U.S.C. section 360bbb-3(b)(1), unless the authorization is terminated or revoked.  Performed at [facility], [place] [address] [place], [place] [address]          Radiology Studies: No results found.      Scheduled Meds:  apixaban  5 mg Oral BID   insulin aspart  0-5 Units Subcutaneous QHS   insulin aspart  0-9 Units Subcutaneous TID WC   metoprolol tartrate  12.5 mg Oral BID   pantoprazole  40 mg Oral Q1200   Continuous Infusions:   LOS: 9 days    Time spent: over 30 min    Fayrene Helper, MD [facility]   To contact the attending provider between 7A-7P or the covering provider during after hours 7P-7A, please log into the web site [email or web address] and access using universal [place] password for that web site. If you do not have the password, please call the hospital operator.  02/12/2022, 7:14 PM

## 2022-02-12 NOTE — Progress Notes (Signed)
Mobility Specialist - Progress Note   02/12/22 1423  Mobility  Activity Dangled on edge of bed  Level of Assistance Minimal assist, patient does 75% or more  Assistive Device None  Activity Response Tolerated poorly  Mobility Referral Yes  $Mobility charge 1 Mobility    Pre-mobility:49 HR, BP During mobility: 54 HR Post-mobility:51 HR  Pt was received in bed and agreeable to session. Upon EOB pt expressed nausea will working on LE movement. Further mobility was deferred. Pt was returned to bed with all needs met,   [place] Specialist Please contact via SecureChat or Rehab office at [phone]

## 2022-02-12 NOTE — TOC Progression Note (Signed)
Transition of Care [facility]) - Progression Note    Patient Details  Name: [name] MRN: [id number] Date of Birth: 02-02-1932  Transition of Care [facility]) CM/SW [place], [place] Phone Number: 02/12/2022, 11:25 AM  Clinical Narrative:     CSW reached out to [place], was advised Josem Kaufmann is pending for pt.   Expected Discharge Plan: [place] Barriers to Discharge: Continued Medical Work up  Expected Discharge Plan and Services In-house Referral: Clinical Social Work     Living arrangements for the past 2 months: Single Family Home                                       Social Determinants of Health (SDOH) Interventions SDOH Screenings   Food Insecurity: No Food Insecurity (02/05/2022)  Housing: Low Risk  (02/05/2022)  Transportation Needs: No Transportation Needs (02/05/2022)  Utilities: Not At Risk (02/05/2022)  Tobacco Use: Low Risk  (02/03/2022)    Readmission Risk Interventions     No data to display

## 2022-02-13 DIAGNOSIS — R112 Nausea with vomiting, unspecified: Secondary | ICD-10-CM | POA: Diagnosis not present

## 2022-02-13 LAB — CBC WITH DIFFERENTIAL/PLATELET
Abs Immature Granulocytes: 0.04 10*3/uL (ref 0.00–0.07)
Basophils Absolute: 0.1 10*3/uL (ref 0.0–0.1)
Basophils Relative: 1 %
Eosinophils Absolute: 1.1 10*3/uL — ABNORMAL HIGH (ref 0.0–0.5)
Eosinophils Relative: 10 %
HCT: 38.1 % (ref 36.0–46.0)
Hemoglobin: 12.3 g/dL (ref 12.0–15.0)
Immature Granulocytes: 0 %
Lymphocytes Relative: 46 %
Lymphs Abs: 5 10*3/uL — ABNORMAL HIGH (ref 0.7–4.0)
MCH: 24.7 pg — ABNORMAL LOW (ref 26.0–34.0)
MCHC: 32.3 g/dL (ref 30.0–36.0)
MCV: 76.5 fL — ABNORMAL LOW (ref 80.0–100.0)
Monocytes Absolute: 0.9 10*3/uL (ref 0.1–1.0)
Monocytes Relative: 9 %
Neutro Abs: 3.6 10*3/uL (ref 1.7–7.7)
Neutrophils Relative %: 34 %
Platelets: 252 10*3/uL (ref 150–400)
RBC: 4.98 MIL/uL (ref 3.87–5.11)
RDW: 17.5 % — ABNORMAL HIGH (ref 11.5–15.5)
WBC: 10.7 10*3/uL — ABNORMAL HIGH (ref 4.0–10.5)
nRBC: 0 % (ref 0.0–0.2)

## 2022-02-13 LAB — COMPREHENSIVE METABOLIC PANEL
ALT: 22 U/L (ref 0–44)
AST: 19 U/L (ref 15–41)
Albumin: 3.2 g/dL — ABNORMAL LOW (ref 3.5–5.0)
Alkaline Phosphatase: 48 U/L (ref 38–126)
Anion gap: 8 (ref 5–15)
BUN: 34 mg/dL — ABNORMAL HIGH (ref 8–23)
CO2: 26 mmol/L (ref 22–32)
Calcium: 9.1 mg/dL (ref 8.9–10.3)
Chloride: 104 mmol/L (ref 98–111)
Creatinine, Ser: 0.93 mg/dL (ref 0.44–1.00)
GFR, Estimated: 59 mL/min — ABNORMAL LOW (ref >60)
Glucose, Bld: 131 mg/dL — ABNORMAL HIGH (ref 70–99)
Potassium: 4.4 mmol/L (ref 3.5–5.1)
Sodium: 138 mmol/L (ref 135–145)
Total Bilirubin: 0.5 mg/dL (ref 0.3–1.2)
Total Protein: 6 g/dL — ABNORMAL LOW (ref 6.5–8.1)

## 2022-02-13 LAB — PHOSPHORUS: Phosphorus: 3.5 mg/dL (ref 2.5–4.6)

## 2022-02-13 LAB — GLUCOSE, CAPILLARY
Glucose-Capillary: 135 mg/dL — ABNORMAL HIGH (ref 70–99)
Glucose-Capillary: 232 mg/dL — ABNORMAL HIGH (ref 70–99)
Glucose-Capillary: 86 mg/dL (ref 70–99)
Glucose-Capillary: 165 mg/dL — ABNORMAL HIGH (ref 70–99)

## 2022-02-13 LAB — MAGNESIUM: Magnesium: 1.7 mg/dL (ref 1.7–2.4)

## 2022-02-13 MED ORDER — POLYETHYLENE GLYCOL 3350 17 G PO PACK
17.0000 g | PACK | Freq: Two times a day (BID) | ORAL | Status: DC
  Administered 2022-02-13 – 2022-02-14 (×3): 17 g via ORAL
  Filled 2022-02-13 (×8): qty 1

## 2022-02-13 NOTE — Progress Notes (Signed)
Mobility Specialist - Progress Note   02/13/22 1023  Mobility  Activity Ambulated with assistance in room  Level of Assistance Contact guard assist, steadying assist  Assistive Device Front wheel walker  Distance Ambulated (ft) 5 ft  Activity Response Tolerated well  Mobility Referral Yes  $Mobility charge 1 Mobility    Pre-mobility: 153/50(81) BP Post-mobility: 162/27(91) BP  Pt received in bed and agreeable to mobility. Pt c/o slight nausea and dizziness during ambulation. Pt was returned to bed with all needs met.   Franki Monte  Mobility Specialist Please contact via Solicitor or Rehab office at [phone]

## 2022-02-13 NOTE — TOC Progression Note (Signed)
Transition of Care [facility]) - Progression Note    Patient Details  Name: [name] MRN: [id number] Date of Birth: 08-Apr-1932  Transition of Care [facility]) CM/SW [place], [place] Phone Number: 02/13/2022, 11:43 AM  Clinical Narrative:     Patients insurance authorization is still pending. Patient has SNF bed at [facility] CSW will continue to follow and assist with patients dc planning needs.   Expected Discharge Plan: [place] Barriers to Discharge: Continued Medical Work up  Expected Discharge Plan and Services In-house Referral: Clinical Social Work     Living arrangements for the past 2 months: Single Family Home                                       Social Determinants of Health (SDOH) Interventions SDOH Screenings   Food Insecurity: No Food Insecurity (02/05/2022)  Housing: Low Risk  (02/05/2022)  Transportation Needs: No Transportation Needs (02/05/2022)  Utilities: Not At Risk (02/05/2022)  Tobacco Use: Low Risk  (02/03/2022)    Readmission Risk Interventions     No data to display

## 2022-02-13 NOTE — Progress Notes (Signed)
Mobility Specialist - Progress Note   02/13/22 1137  Mobility  Activity Transferred from chair to bed  Level of Assistance Minimal assist, patient does 75% or more  Assistive Device Front wheel walker  Activity Response Tolerated well  Mobility Referral Yes  $Mobility charge 1 Mobility   Pt was received in chair requesting assistance back to bed. No complaints throughout transfer. Pt was returned to bed with all needs met. NT present.   Franki Monte  Mobility Specialist Please contact via Solicitor or Rehab office at [phone]

## 2022-02-13 NOTE — Progress Notes (Signed)
PROGRESS NOTE    [name]  TIW:[id number] DOB: 1932/04/13 DOA: 02/03/2022 PCP: Antony Contras, MD  Chief Complaint  Patient presents with   Emesis   Diarrhea    Brief Narrative:  89/F with history of DM2, hypertension, AAA, PVD, TIA, cerebral aneurysm status post coiling, GERD, duodenitis, IBS, anxiety.  Admitted last month in early December for new onset [name]-fib with RVR and was started on Coreg and Eliquis.  -Presented to the ED 1/4 with nausea vomiting and diarrhea -In the ED she was noted to be hypertensive, blood pressure 220, mildly hypoxic requiring 2 L O2.  Labs were unremarkable except for mildly elevated high-sensitivity troponin, flu and COVID PCR were negative, CT abdomen pelvis negative for acute findings, chest x-ray noted pulmonary vascular congestion and possible mild interstitial edema. -Complicated by Bridget -fib RVR -gastroenteritis improving, nausea persists -Recurrent [name]-fib with RVR, cardiology consulting   Assessment & Plan:   Principal Problem:   Nausea and vomiting Active Problems:   Elevated troponin   Diarrhea   DM (diabetes mellitus) (HCC)   Chest pain   Acute hypoxemic respiratory failure (HCC)   Hypertensive urgency   Paroxysmal atrial fibrillation (HCC)   Acute on chronic heart failure with preserved ejection fraction (HCC)   Atrial fibrillation (HCC)  Nausea/vomiting/diarrhea -Suspect viral gastroenteritis, flu and COVID PCR negative, CT abdomen pelvis without acute findings -Continue supportive care, no further diarrhea or vomiting -Diet as tolerated, improved appetite today -Increase activity, PT eval, patient declines rehab -> sounds like agreeable now -improved today, will follow   Orthostasis - LH and nausea after working with therapy  - decrease metoprolol dose with bradycardia (12.5 mg BID for now) - follow orthostatics - mild lightheadedness today - ted hose - consider abdominal binder   Atrial fibrillation with RVR -Recent echo  with preserved EF, required Cardizem drip on admission -Recurrent [name]-fib RVR this admission -I wonder if [name]-fib is contributing to her persistent nausea, appreciate cards input, now on metoprolol, dose reduced to 12.5 mg BID, now in sinus -continue Eliquis   Acute on chronic diastolic CHF Mild hypoxia -Likely triggered by [name]-fib, has been taking Lasix 3 times [name] week, chest x-ray with pulmonary vascular congestion and mild edema -Treated with 2 doses of IV Lasix, now euvolemic, hold further diuretics -trend, adjust diuretics as needed -Last echo 12/23 with EF 55-60%, grade 1 DD -Incentive spirometry, weaned off O2    elevated troponin -Likely secondary to demand in the setting of CHF, [name]-fib -Do not suspect ACS   Hypertensive urgency -Systolic as high as 250, now improved  -Continue metoprolol (dose reduced), diuretics as above   Insulin-dependent diabetes -A1c 6.2 on 01/02/2022 -Sensitive sliding scale insulin ACHS -Continue current dose of Lantus    DVT prophylaxis: eliquis Code Status: DNR Family Communication: none Disposition:   Status is: Inpatient Remains inpatient appropriate because: pending further improvemetn   Consultants:  cards  Procedures:  none  Antimicrobials:  Anti-infectives (From admission, onward)    None       Subjective: C/o mild LH when up with therapy (overall improved)  Objective: Vitals:   02/13/22 0738 02/13/22 0836 02/13/22 1130 02/13/22 1630  BP: (!) 188/64  (!) 158/94 (!) 178/70  Pulse: (!) 58 62 62 69  Resp: '16  18 16  '$ Temp: 98.3 F (36.8 C)  98.4 F (36.9 C) 98.2 F (36.8 C)  TempSrc: Oral  Oral Oral  SpO2: 98%  97% 94%  Weight:      Height:  Intake/Output Summary (Last 24 hours) at 02/13/2022 1948 Last data filed at 02/13/2022 1408 Gross per 24 hour  Intake 222 ml  Output 300 ml  Net -78 ml   Filed Weights   02/11/22 0401 02/12/22 0538 02/13/22 0500  Weight: 63.9 kg 65 kg 65.8 kg     Examination:  General: No acute distress. Cardiovascular: RRR Lungs: unlabored Abdomen: Soft, nontender, nondistended  Neurological: Alert and oriented 3. Moves all extremities 4 with equal strength. Cranial nerves II through XII grossly intact. Extremities: No clubbing or cyanosis. No edema.   Data Reviewed: I have personally reviewed following labs and imaging studies  CBC: Recent Labs  Lab 02/07/22 0157 02/09/22 0128 02/11/22 0224 02/12/22 0926 02/13/22 0200  WBC 9.4 11.7* 11.2* 8.8 10.7*  NEUTROABS  --   --   --   --  3.6  HGB 11.8* 12.4 11.4* 12.0 12.3  HCT 37.3 39.7 36.5 38.1 38.1  MCV 76.7* 76.9* 78.0* 77.9* 76.5*  PLT 249 270 233 246 086    Basic Metabolic Panel: Recent Labs  Lab 02/07/22 0157 02/09/22 0128 02/11/22 0224 02/12/22 0926 02/13/22 0200  NA 136 136 138 136 138  K 4.0 4.1 4.4 4.3 4.4  CL 103 105 104 102 104  CO2 '26 23 25 24 26  '$ GLUCOSE 99 112* 126* 246* 131*  BUN 26* 26* 33* 32* 34*  CREATININE 0.94 0.92 1.02* 0.98 0.93  CALCIUM 8.7* 8.5* 8.7* 8.7* 9.1  MG  --   --   --   --  1.7  PHOS  --   --   --   --  3.5    GFR: Estimated Creatinine Clearance: 38.9 mL/min (by C-G formula based on SCr of 0.93 mg/dL).  Liver Function Tests: Recent Labs  Lab 02/13/22 0200  AST 19  ALT 22  ALKPHOS 48  BILITOT 0.5  PROT 6.0*  ALBUMIN 3.2*     CBG: Recent Labs  Lab 02/12/22 1636 02/12/22 2057 02/13/22 0737 02/13/22 1136 02/13/22 1633  GLUCAP 107* 166* 135* 232* 86     Recent Results (from the past 240 hour(s))  Resp panel by RT-PCR (RSV, Flu [name]&B, Covid) Anterior Nasal Swab     Status: None   Collection Time: 02/03/22  8:05 PM   Specimen: Anterior Nasal Swab  Result Value Ref Range Status   SARS Coronavirus 2 by RT PCR NEGATIVE NEGATIVE Final    Comment: (NOTE) SARS-CoV-2 target nucleic acids are NOT DETECTED.  The SARS-CoV-2 RNA is generally detectable in upper respiratory specimens during the acute phase of infection. The  lowest concentration of SARS-CoV-2 viral copies this assay can detect is 138 copies/mL. Lakeva Hollon negative result does not preclude SARS-Cov-2 infection and should not be used as the sole basis for treatment or other patient management decisions. Nikol Lemar negative result may occur with  improper specimen collection/handling, submission of specimen other than nasopharyngeal swab, presence of viral mutation(s) within the areas targeted by this assay, and inadequate number of viral copies(<138 copies/mL). Shaylan Tutton negative result must be combined with clinical observations, patient history, and epidemiological information. The expected result is Negative.  Fact Sheet for Patients:  EntrepreneurPulse.com.au  Fact Sheet for Healthcare Providers:  IncredibleEmployment.be  This test is no t yet approved or cleared by the [place] FDA and  has been authorized for detection and/or diagnosis of SARS-CoV-2 by FDA under an Emergency Use Authorization (EUA). This EUA will remain  in effect (meaning this test can be used) for the duration of  the COVID-19 declaration under Section 564(b)(1) of the Act, 21 U.S.C.section 360bbb-3(b)(1), unless the authorization is terminated  or revoked sooner.       Influenza Desirre Eickhoff by PCR NEGATIVE NEGATIVE Final   Influenza B by PCR NEGATIVE NEGATIVE Final    Comment: (NOTE) The Xpert Xpress SARS-CoV-2/FLU/RSV plus assay is intended as an aid in the diagnosis of influenza from Nasopharyngeal swab specimens and should not be used as Savan [place]uta sole basis for treatment. Nasal washings and aspirates are unacceptable for Xpert Xpress SARS-CoV-2/FLU/RSV testing.  Fact Sheet for Patients: EntrepreneurPulse.com.au  Fact Sheet for Healthcare Providers: IncredibleEmployment.be  This test is not yet approved or cleared by the [place] FDA and has been authorized for detection and/or diagnosis of SARS-CoV-2 by FDA under  an Emergency Use Authorization (EUA). This EUA will remain in effect (meaning this test can be used) for the duration of the COVID-19 declaration under Section 564(b)(1) of the Act, 21 U.S.C. section 360bbb-3(b)(1), unless the authorization is terminated or revoked.     Resp Syncytial Virus by PCR NEGATIVE NEGATIVE Final    Comment: (NOTE) Fact Sheet for Patients: EntrepreneurPulse.com.au  Fact Sheet for Healthcare Providers: IncredibleEmployment.be  This test is not yet approved or cleared by the [place] FDA and has been authorized for detection and/or diagnosis of SARS-CoV-2 by FDA under an Emergency Use Authorization (EUA). This EUA will remain in effect (meaning this test can be used) for the duration of the COVID-19 declaration under Section 564(b)(1) of the Act, 21 U.S.C. section 360bbb-3(b)(1), unless the authorization is terminated or revoked.  Performed at [facility], [place] [address], [place], [place] [address]          Radiology Studies: No results found.      Scheduled Meds:  apixaban  5 mg Oral BID   insulin aspart  0-5 Units Subcutaneous QHS   insulin aspart  0-9 Units Subcutaneous TID WC   metoprolol tartrate  12.5 mg Oral BID   pantoprazole  40 mg Oral Q1200   polyethylene glycol  17 g Oral BID   Continuous Infusions:   LOS: 10 days    Time spent: over 30 min    Fayrene Helper, MD [facility]   To contact the attending provider between 7A-7P or the covering provider during after hours 7P-7A, please log into the web site [email or web address] and access using universal [place] password for that web site. If you do not have the password, please call the hospital operator.  02/13/2022, 7:48 PM

## 2022-02-13 NOTE — Progress Notes (Signed)
Occupational Therapy Treatment Patient Details Name: [name] MRN: [id number] DOB: December 12, 1932 Today's Date: 02/13/2022   History of present illness Pt is an 89 y.o. female admitted 02/03/21 with nausea, vomiting, diarrhea; suspect viral gastroenteritis. Pt also with HTN, afib with RVR. Recent admission 12/2021 for new onset afib with RVR. PMH includes DM2, HTN, AAA, PVD, TIA, cerebral aneurysm s/p coiling, IBS, duodenitis, anxiety.   OT comments  Pt progressing toward goals this session, completing seated ADLs with set up -min guard A, supervision for bed mobility, and supervision for transfers with RW. Pt reporting mild dizziness, BP WNL throughout session. Pt presenting with impairments listed below, will follow acutely. Updating d/c recommendation to SNF.   Recommendations for follow up therapy are one component of a multi-disciplinary discharge planning process, led by the attending physician.  Recommendations may be updated based on patient status, additional functional criteria and insurance authorization.    Follow Up Recommendations  Skilled nursing-short term rehab (<3 hours/day)     Assistance Recommended at Discharge Frequent or constant Supervision/Assistance  Patient can return home with the following  A little help with walking and/or transfers;A little help with bathing/dressing/bathroom;Assistance with cooking/housework;Assist for transportation;Help with stairs or ramp for entrance   Equipment Recommendations  None recommended by OT    Recommendations for Other Services PT consult    Precautions / Restrictions Precautions Precautions: Fall Precaution Comments: monitor BP Restrictions Weight Bearing Restrictions: No       Mobility Bed Mobility Overal bed mobility: Needs Assistance Bed Mobility: Supine to Sit, Sit to Supine     Supine to sit: Supervision, HOB elevated Sit to supine: Supervision        Transfers Overall transfer level: Needs  assistance Equipment used: Rolling walker (2 wheels) Transfers: Sit to/from Stand Sit to Stand: Supervision                 Balance Overall balance assessment: Needs assistance Sitting-balance support: No upper extremity supported, Feet supported Sitting balance-Leahy Scale: Good Sitting balance - Comments: completes seated ADLs without LOB   Standing balance support: Bilateral upper extremity supported, Reliant on assistive device for balance Standing balance-Leahy Scale: Poor Standing balance comment: reliant on external support                           ADL either performed or assessed with clinical judgement   ADL Overall ADL's : Needs assistance/impaired     Grooming: Set up;Sitting;Wash/dry face;Brushing hair Grooming Details (indicate cue type and reason): seated EOB x5 min     Lower Body Bathing: Moderate assistance;Sitting/lateral leans           Toilet Transfer: Min guard;Rolling walker (2 wheels);Ambulation;Regular Glass blower/designer Details (indicate cue type and reason): simulated via functional mobility         Functional mobility during ADLs: Min guard;Rolling walker (2 wheels)      Extremity/Trunk Assessment Upper Extremity Assessment Upper Extremity Assessment: Generalized weakness   Lower Extremity Assessment Lower Extremity Assessment: Defer to PT evaluation        Vision   Vision Assessment?: No apparent visual deficits   Perception Perception Perception: Not tested   Praxis Praxis Praxis: Not tested    Cognition Arousal/Alertness: Awake/alert Behavior During Therapy: WFL for tasks assessed/performed Overall Cognitive Status: Within Functional Limits for tasks assessed  General Comments: WFL  for simple tasks assessed, would benefit from higher level assessment        Exercises      Shoulder Instructions       General Comments VSS on RA    Pertinent  Vitals/ Pain       Pain Assessment Pain Assessment: Faces Pain Score: 2  Faces Pain Scale: Hurts a little bit Pain Location: generalized Pain Descriptors / Indicators: Tiring, Grimacing Pain Intervention(s): Limited activity within patient's tolerance  Home Living                                          Prior Functioning/Environment              Frequency  Min 2X/week        Progress Toward Goals  OT Goals(current goals can now be found in the care plan section)  Progress towards OT goals: Progressing toward goals  Acute Rehab OT Goals Patient Stated Goal: none stated OT Goal Formulation: With patient Time For Goal Achievement: 02/21/22 Potential to Achieve Goals: Good ADL Goals Pt Will Perform Lower Body Bathing: sit to/from stand;with supervision Pt Will Perform Lower Body Dressing: sit to/from stand;with supervision Pt Will Transfer to Toilet: with supervision;ambulating;regular height toilet Additional ADL Goal #1: pt will indep verbalize at least 3 energy conservation strategies to increase safety at d/c  Plan Frequency remains appropriate;Discharge plan remains appropriate    Co-evaluation                 AM-PAC OT "6 Clicks" Daily Activity     Outcome Measure   Help from another person eating meals?: None Help from another person taking care of personal grooming?: A Little Help from another person toileting, which includes using toliet, bedpan, or urinal?: A Lot Help from another person bathing (including washing, rinsing, drying)?: A Lot Help from another person to put on and taking off regular upper body clothing?: A Little Help from another person to put on and taking off regular lower body clothing?: A Lot 6 Click Score: 16    End of Session Equipment Utilized During Treatment: Rolling walker (2 wheels)  OT Visit Diagnosis: Unsteadiness on feet (R26.81);Muscle weakness (generalized) (M62.81)   Activity Tolerance Patient  tolerated treatment well   Patient Left with call bell/phone within reach;in chair;with chair alarm set   Nurse Communication Mobility status        Time: 6294-7654 OT Time Calculation (min): 19 min  Charges: OT General Charges $OT Visit: 1 Visit OT Treatments $Self Care/Home Management : 8-22 mins  Renaye Rakers, OTD, OTR/L SecureChat Preferred Acute Rehab [phone]   Renaye Rakers [place] 02/13/2022, 12:56 PM

## 2022-02-14 DIAGNOSIS — R112 Nausea with vomiting, unspecified: Secondary | ICD-10-CM | POA: Diagnosis not present

## 2022-02-14 LAB — CBC WITH DIFFERENTIAL/PLATELET
Abs Immature Granulocytes: 0.03 10*3/uL (ref 0.00–0.07)
Basophils Absolute: 0.1 10*3/uL (ref 0.0–0.1)
Basophils Relative: 1 %
Eosinophils Absolute: 0.9 10*3/uL — ABNORMAL HIGH (ref 0.0–0.5)
Eosinophils Relative: 10 %
HCT: 36.6 % (ref 36.0–46.0)
Hemoglobin: 11.8 g/dL — ABNORMAL LOW (ref 12.0–15.0)
Immature Granulocytes: 0 %
Lymphocytes Relative: 48 %
Lymphs Abs: 4.5 10*3/uL — ABNORMAL HIGH (ref 0.7–4.0)
MCH: 24.7 pg — ABNORMAL LOW (ref 26.0–34.0)
MCHC: 32.2 g/dL (ref 30.0–36.0)
MCV: 76.7 fL — ABNORMAL LOW (ref 80.0–100.0)
Monocytes Absolute: 0.8 10*3/uL (ref 0.1–1.0)
Monocytes Relative: 8 %
Neutro Abs: 3.2 10*3/uL (ref 1.7–7.7)
Neutrophils Relative %: 33 %
Platelets: 229 10*3/uL (ref 150–400)
RBC: 4.77 MIL/uL (ref 3.87–5.11)
RDW: 17.5 % — ABNORMAL HIGH (ref 11.5–15.5)
WBC: 9.5 10*3/uL (ref 4.0–10.5)
nRBC: 0 % (ref 0.0–0.2)

## 2022-02-14 LAB — COMPREHENSIVE METABOLIC PANEL
ALT: 21 U/L (ref 0–44)
AST: 18 U/L (ref 15–41)
Albumin: 3.1 g/dL — ABNORMAL LOW (ref 3.5–5.0)
Alkaline Phosphatase: 46 U/L (ref 38–126)
Anion gap: 7 (ref 5–15)
BUN: 31 mg/dL — ABNORMAL HIGH (ref 8–23)
CO2: 27 mmol/L (ref 22–32)
Calcium: 8.9 mg/dL (ref 8.9–10.3)
Chloride: 103 mmol/L (ref 98–111)
Creatinine, Ser: 1.04 mg/dL — ABNORMAL HIGH (ref 0.44–1.00)
GFR, Estimated: 52 mL/min — ABNORMAL LOW (ref >60)
Glucose, Bld: 132 mg/dL — ABNORMAL HIGH (ref 70–99)
Potassium: 4.6 mmol/L (ref 3.5–5.1)
Sodium: 137 mmol/L (ref 135–145)
Total Bilirubin: 0.4 mg/dL (ref 0.3–1.2)
Total Protein: 5.9 g/dL — ABNORMAL LOW (ref 6.5–8.1)

## 2022-02-14 LAB — GLUCOSE, CAPILLARY
Glucose-Capillary: 158 mg/dL — ABNORMAL HIGH (ref 70–99)
Glucose-Capillary: 139 mg/dL — ABNORMAL HIGH (ref 70–99)
Glucose-Capillary: 124 mg/dL — ABNORMAL HIGH (ref 70–99)
Glucose-Capillary: 200 mg/dL — ABNORMAL HIGH (ref 70–99)

## 2022-02-14 LAB — MAGNESIUM: Magnesium: 1.7 mg/dL (ref 1.7–2.4)

## 2022-02-14 LAB — PHOSPHORUS: Phosphorus: 4.1 mg/dL (ref 2.5–4.6)

## 2022-02-14 NOTE — Progress Notes (Signed)
PROGRESS NOTE    [name]  ZOX:[id number] DOB: Jun 29, 1932 DOA: 02/03/2022 PCP: Hailey Contras, MD  Chief Complaint  Patient presents with   Emesis   Diarrhea    Brief Narrative:  89/F with history of DM2, hypertension, AAA, PVD, TIA, cerebral aneurysm status post coiling, GERD, duodenitis, IBS, anxiety.  Admitted last month in early December for new onset [name] with RVR and was started on Coreg and Eliquis.  -Presented to the ED 1/4 with nausea vomiting and diarrhea -In the ED she was noted to be hypertensive, blood pressure 220, mildly hypoxic requiring 2 L O2.  Labs were unremarkable except for mildly elevated high-sensitivity troponin, flu and COVID PCR were negative, CT abdomen pelvis negative for acute findings, chest x-ray noted pulmonary vascular congestion and possible mild interstitial edema. -Complicated by [name]-fib RVR -gastroenteritis improving, nausea persists -Recurrent [name]-fib with RVR, cardiology consulting   Assessment & Plan:   Principal Problem:   Nausea and vomiting Active Problems:   Elevated troponin   Diarrhea   DM (diabetes mellitus) (HCC)   Chest pain   Acute hypoxemic respiratory failure (HCC)   Hypertensive urgency   Paroxysmal atrial fibrillation (HCC)   Acute on chronic heart failure with preserved ejection fraction (HCC)   Atrial fibrillation (HCC)  Nausea/vomiting/diarrhea -Suspect viral gastroenteritis, flu and COVID PCR negative, CT abdomen pelvis without acute findings -Continue supportive care, no further diarrhea or vomiting -Diet as tolerated, improved appetite today -Increase activity, PT eval, patient declines rehab -> sounds like agreeable now -improved today, will follow   Orthostasis - LH and nausea after working with therapy - overall improved, still some mild lightheadedness - decrease metoprolol dose with bradycardia (12.5 mg BID for now) - follow orthostatics - pending for today - mild lightheadedness today - ted hose (not on  yet, follow) - consider abdominal binder   Atrial fibrillation with RVR -Recent echo with preserved EF, required Cardizem drip on admission -Recurrent [name]-fib RVR this admission -I wonder if [name]-fib is contributing to her persistent nausea, appreciate cards input, now on metoprolol, dose reduced to 12.5 mg BID, now in sinus -continue Eliquis   Acute on chronic diastolic CHF Mild hypoxia -Likely triggered by [name]-fib, has been taking Lasix 3 times [name] week, chest x-ray with pulmonary vascular congestion and mild edema -Treated with 2 doses of IV Lasix, now euvolemic, hold further diuretics -trend, adjust diuretics as needed -Last echo 12/23 with EF 55-60%, grade 1 DD -Incentive spirometry, weaned off O2    elevated troponin -Likely secondary to demand in the setting of CHF, [name]-fib -Do not suspect ACS   Hypertensive urgency -Systolic as high as 811, now improved  -BP's on the high side, may need to tolerate higher BP's due to orthostasis, but will follow orthostasis -Continue metoprolol (dose reduced), diuretics as above   Insulin-dependent diabetes -A1c 6.2 on 01/02/2022 -Sensitive sliding scale insulin ACHS -Continue current dose of Lantus    DVT prophylaxis: eliquis Code Status: DNR Family Communication: none Disposition:   Status is: Inpatient Remains inpatient appropriate because: pending further improvemetn   Consultants:  cards  Procedures:  none  Antimicrobials:  Anti-infectives (From admission, onward)    None       Subjective: Continued mild lightheadedness  Objective: Vitals:   02/13/22 1630 02/13/22 2113 02/14/22 0528 02/14/22 0828  BP: (!) 178/70 (!) 148/59 (!) 172/61 (!) 156/71  Pulse: 69 61 63 65  Resp: '16 18 16   '$ Temp: 98.2 F (36.8 C) 98.5 F (36.9 C)  97.8 F (36.6 C)   TempSrc: Oral Oral Oral   SpO2: 94% 95% 97%   Weight:   65.6 kg   Height:        Intake/Output Summary (Last 24 hours) at 02/14/2022 1039 Last data filed at 02/14/2022  0530 Gross per 24 hour  Intake 222 ml  Output 300 ml  Net -78 ml   Filed Weights   02/12/22 0538 02/13/22 0500 02/14/22 0528  Weight: 65 kg 65.8 kg 65.6 kg    Examination:  General: No acute distress. Cardiovascular: RRR Lungs: unlabored Abdomen: Soft, nontender, nondistended  Neurological: Alert and oriented 3. Moves all extremities 4 with equal strength. Cranial nerves II through XII grossly intact. Extremities: No clubbing or cyanosis. No edema.   Data Reviewed: I have personally reviewed following labs and imaging studies  CBC: Recent Labs  Lab 02/09/22 0128 02/11/22 0224 02/12/22 0926 02/13/22 0200 02/14/22 0148  WBC 11.7* 11.2* 8.8 10.7* 9.5  NEUTROABS  --   --   --  3.6 3.2  HGB 12.4 11.4* 12.0 12.3 11.8*  HCT 39.7 36.5 38.1 38.1 36.6  MCV 76.9* 78.0* 77.9* 76.5* 76.7*  PLT 270 233 246 252 962    Basic Metabolic Panel: Recent Labs  Lab 02/09/22 0128 02/11/22 0224 02/12/22 0926 02/13/22 0200 02/14/22 0148  NA 136 138 136 138 137  K 4.1 4.4 4.3 4.4 4.6  CL 105 104 102 104 103  CO2 '23 25 24 26 27  '$ GLUCOSE 112* 126* 246* 131* 132*  BUN 26* 33* 32* 34* 31*  CREATININE 0.92 1.02* 0.98 0.93 1.04*  CALCIUM 8.5* 8.7* 8.7* 9.1 8.9  MG  --   --   --  1.7 1.7  PHOS  --   --   --  3.5 4.1    GFR: Estimated Creatinine Clearance: 34.7 mL/min (Tamasha Laplante) (by C-G formula based on SCr of 1.04 mg/dL (H)).  Liver Function Tests: Recent Labs  Lab 02/13/22 0200 02/14/22 0148  AST 19 18  ALT 22 21  ALKPHOS 48 46  BILITOT 0.5 0.4  PROT 6.0* 5.9*  ALBUMIN 3.2* 3.1*     CBG: Recent Labs  Lab 02/13/22 0737 02/13/22 1136 02/13/22 1633 02/13/22 2113 02/14/22 0814  GLUCAP 135* 232* 86 165* 158*     No results found for this or any previous visit (from the past 240 hour(s)).        Radiology Studies: No results found.      Scheduled Meds:  apixaban  5 mg Oral BID   insulin aspart  0-5 Units Subcutaneous QHS   insulin aspart  0-9 Units  Subcutaneous TID WC   metoprolol tartrate  12.5 mg Oral BID   pantoprazole  40 mg Oral Q1200   polyethylene glycol  17 g Oral BID   Continuous Infusions:   LOS: 11 days    Time spent: over 30 min    Hailey Helper, MD [facility]   To contact the attending provider between 7A-7P or the covering provider during after hours 7P-7A, please log into the web site [email or web address] and access using universal [place] password for that web site. If you do not have the password, please call the hospital operator.  02/14/2022, 10:39 AM

## 2022-02-14 NOTE — Progress Notes (Signed)
Mobility Specialist Progress Note   02/14/22 1609  Mobility  Activity Ambulated with assistance in room;Transferred from bed to chair  Level of Assistance Contact guard assist, steadying assist  Assistive Device Front wheel walker  Distance Ambulated (ft) 10 ft  Range of Motion/Exercises Active;All extremities  Activity Response Tolerated well   Patient received in supine and agreeable to participate. Was independent for bed mobility, stood and ambulated short distance around the bed to recliner with min guard and steady gait. Tolerated without complaint or incident. Was left in recliner with all needs met, call bell in reach.   [name], BS EXP Mobility Specialist Please contact via SecureChat or Rehab office at [phone]

## 2022-02-14 NOTE — TOC Progression Note (Addendum)
Transition of Care [facility]) - Progression Note    Patient Details  Name: [name] MRN: [id number] Date of Birth: 1932-12-08  Transition of Care [facility]) CM/SW [place], [place] Phone Number: 02/14/2022, 8:46 AM  Clinical Narrative:     Update- 2:56pm- Patients insurance authorization still pending. CSW will continue to follow and assist with patients dc planning needs.  CSW received message from patients insurance requesting updated PT/OT notes for review. CSW uploaded requested clinicals to navi portal. CSW awaiting determination. CSW will continue to follow and assist with patients dc planning needs.  Expected Discharge Plan: [place] Barriers to Discharge: Continued Medical Work up  Expected Discharge Plan and Services In-house Referral: Clinical Social Work     Living arrangements for the past 2 months: Single Family Home                                       Social Determinants of Health (SDOH) Interventions SDOH Screenings   Food Insecurity: No Food Insecurity (02/05/2022)  Housing: Low Risk  (02/05/2022)  Transportation Needs: No Transportation Needs (02/05/2022)  Utilities: Not At Risk (02/05/2022)  Tobacco Use: Low Risk  (02/03/2022)    Readmission Risk Interventions     No data to display

## 2022-02-15 DIAGNOSIS — R112 Nausea with vomiting, unspecified: Secondary | ICD-10-CM | POA: Diagnosis not present

## 2022-02-15 LAB — GLUCOSE, CAPILLARY
Glucose-Capillary: 143 mg/dL — ABNORMAL HIGH (ref 70–99)
Glucose-Capillary: 203 mg/dL — ABNORMAL HIGH (ref 70–99)

## 2022-02-15 MED ORDER — METOPROLOL TARTRATE 5 MG/5ML IV SOLN
INTRAVENOUS | Status: AC
  Administered 2022-02-15: 5 mg
  Filled 2022-02-15: qty 5

## 2022-02-15 MED ORDER — METOPROLOL TARTRATE 25 MG PO TABS
25.0000 mg | ORAL_TABLET | Freq: Three times a day (TID) | ORAL | Status: DC
  Administered 2022-02-15 – 2022-02-16 (×5): 25 mg via ORAL
  Filled 2022-02-15 (×5): qty 1

## 2022-02-15 MED ORDER — METOPROLOL TARTRATE 12.5 MG HALF TABLET: 12.5000 mg | ORAL_TABLET | Freq: Once | ORAL | Status: DC

## 2022-02-15 MED ORDER — METOPROLOL TARTRATE 12.5 MG HALF TABLET
12.5000 mg | ORAL_TABLET | Freq: Once | ORAL | Status: AC
  Administered 2022-02-15: 12.5 mg via ORAL

## 2022-02-15 MED ORDER — METOPROLOL TARTRATE 5 MG/5ML IV SOLN
5.0000 mg | INTRAVENOUS | Status: AC | PRN
  Administered 2022-02-15: 5 mg via INTRAVENOUS
  Filled 2022-02-15: qty 5

## 2022-02-15 MED ORDER — METOPROLOL TARTRATE 25 MG PO TABS: 25.0000 mg | ORAL_TABLET | Freq: Two times a day (BID) | ORAL | Status: DC

## 2022-02-15 MED ORDER — METOPROLOL TARTRATE 25 MG PO TABS
25.0000 mg | ORAL_TABLET | Freq: Once | ORAL | Status: DC
  Filled 2022-02-15: qty 1

## 2022-02-15 MED ORDER — METOPROLOL TARTRATE 5 MG/5ML IV SOLN
2.5000 mg | INTRAVENOUS | Status: AC | PRN
  Administered 2022-02-15 (×3): 2.5 mg via INTRAVENOUS
  Filled 2022-02-15 (×2): qty 5

## 2022-02-15 NOTE — TOC Progression Note (Signed)
Transition of Care [facility]) - Progression Note    Patient Details  Name: [name] MRN: [id number] Date of Birth: 08-15-1932  Transition of Care [facility]) CM/SW [place], [place] Phone Number: 02/15/2022, 9:51 AM  Clinical Narrative:     Patients insurance authorization has been approved. [place] ID# [id number]. Insurance authorization approved from 1/16-1/18. CSW informed MD. CSW will continue to follow and assist with patients dc planning needs.  Expected Discharge Plan: [place] Barriers to Discharge: Continued Medical Work up  Expected Discharge Plan and Services In-house Referral: Clinical Social Work     Living arrangements for the past 2 months: Single Family Home                                       Social Determinants of Health (SDOH) Interventions SDOH Screenings   Food Insecurity: No Food Insecurity (02/05/2022)  Housing: Low Risk  (02/05/2022)  Transportation Needs: No Transportation Needs (02/05/2022)  Utilities: Not At Risk (02/05/2022)  Tobacco Use: Low Risk  (02/03/2022)    Readmission Risk Interventions     No data to display

## 2022-02-15 NOTE — Progress Notes (Signed)
PT Cancellation Note  Patient Details Name: [name] MRN: [id number] DOB: 03/03/1932   Cancelled Treatment:    Reason Eval/Treat Not Completed: (P) Medical issues which prohibited therapy Pt in Afib HR in 140s. Pt reporting "my heart needs to rest." Pt will follow back for treatment tomorrow as appropriate.  Don Giarrusso B. Migdalia Dk PT, DPT Acute Rehabilitation Services Please use secure chat or  Call Office [phone]    [place] 02/15/2022, 2:54 PM

## 2022-02-15 NOTE — Progress Notes (Signed)
PROGRESS NOTE    [name]  NAT:[id number] DOB: 1932/02/09 DOA: 02/03/2022 PCP: Hailey Contras, MD  Chief Complaint  Patient presents with   Emesis   Diarrhea    Brief Narrative:  89/F with history of DM2, hypertension, AAA, PVD, TIA, cerebral aneurysm status post coiling, GERD, duodenitis, IBS, anxiety.  Admitted last month in early December for new onset [name]-fib with RVR and was started on Coreg and Eliquis.   She presented to the ED 1/4 with nausea vomiting and diarrhea  In the ED she was noted to be hypertensive, blood pressure 220, mildly hypoxic requiring 2 L O2.  Labs were unremarkable except for mildly elevated high-sensitivity troponin, flu and COVID PCR were negative, CT abdomen pelvis negative for acute findings, chest x-ray noted pulmonary vascular congestion and possible mild interstitial edema.  She was treated for suspected viral gastroenteritis.  Hospitalization complicated by volume overload and afib with RVR.  She'd been improving, but had orthostasis and mild sinus bradycardia and metoprolol had been decreased over past few days.  Now back in afib.  Assessment & Plan:   Principal Problem:   Nausea and vomiting Active Problems:   Elevated troponin   Diarrhea   DM (diabetes mellitus) (HCC)   Chest pain   Acute hypoxemic respiratory failure (HCC)   Hypertensive urgency   Paroxysmal atrial fibrillation (HCC)   Acute on chronic heart failure with preserved ejection fraction (HCC)   Atrial fibrillation (HCC)  Atrial fibrillation with RVR -Recent echo with preserved EF, required Cardizem drip on admission -Recurrent [name]-fib RVR this admission -I wonder if [name]-fib is contributing to her persistent nausea, appreciate cards input, now on metoprolol -back in RVR this morning - she was on metop, I'd reduced the dose due to bradycardia and symptomatic orthostasis over the past few days, but now back in RVR.  Will uptitrate as tolerated. -continue Eliquis -will ask  cardiology to come back by   Orthostasis - LH and nausea after working with therapy - overall improved, still some mild lightheadedness - metoprolol dose being adjusted  - follow orthostatics - pending for today (weren't done yesterday, pending today) - mild lightheadedness yesterday - hasn't been up yet today, continue compression stockings - consider abdominal binder if persistent   Nausea/vomiting/diarrhea -Suspect viral gastroenteritis, flu and COVID PCR negative, CT abdomen pelvis without acute findings -Continue supportive care, no further diarrhea or vomiting -Diet as tolerated, improved appetite today -resolved   Acute on chronic diastolic CHF Mild hypoxia -Likely triggered by [name]-fib, has been taking Lasix 3 times [name] week, chest x-ray with pulmonary vascular congestion and mild edema -Treated with 2 doses of IV Lasix, now euvolemic, hold further diuretics -Last echo 12/23 with EF 55-60%, grade 1 DD -Incentive spirometry, weaned off O2    elevated troponin -Likely secondary to demand in the setting of CHF, [name]-fib -Do not suspect ACS   Hypertensive urgency -Systolic as high as 427, now improved  -BP's on the high side, may need to tolerate higher BP's due to orthostasis, but will follow orthostasis - repeat set today -Continue metoprolol (dose reduced), diuretics as above   Insulin-dependent diabetes -A1c 6.2 on 01/02/2022 -Sensitive sliding scale insulin ACHS -Continue current dose of Lantus    DVT prophylaxis: eliquis Code Status: DNR Family Communication: none Disposition:   Status is: Inpatient Remains inpatient appropriate because: pending further improvemetn   Consultants:  cards  Procedures:  none  Antimicrobials:  Anti-infectives (From admission, onward)    None  Subjective: Hasn't been up yet today Denies complaints  Objective: Vitals:   02/14/22 2001 02/15/22 0314 02/15/22 0809 02/15/22 1015  BP: (!) 139/48 (!) 141/51 (!) 155/46 (!)  153/59  Pulse: 63 (!) 54 60 (!) 102  Resp: '18 18 20 20  '$ Temp: 98.2 F (36.8 C) 98.3 F (36.8 C) 98.2 F (36.8 C)   TempSrc: Oral Oral Oral   SpO2: 97% 94% 98%   Weight:  65.5 kg    Height:        Intake/Output Summary (Last 24 hours) at 02/15/2022 1018 Last data filed at 02/15/2022 0318 Gross per 24 hour  Intake --  Output 500 ml  Net -500 ml   Filed Weights   02/13/22 0500 02/14/22 0528 02/15/22 0314  Weight: 65.8 kg 65.6 kg 65.5 kg    Examination:  General: No acute distress. Cardiovascular: irregularly irregular, tachycardic  Lungs: unlabored Abdomen: Soft, nontender, nondistended Neurological: Alert and oriented 3. Moves all extremities 4 with equal strength. Cranial nerves II through XII grossly intact. Extremities: No clubbing or cyanosis. No edema.   Data Reviewed: I have personally reviewed following labs and imaging studies  CBC: Recent Labs  Lab 02/09/22 0128 02/11/22 0224 02/12/22 0926 02/13/22 0200 02/14/22 0148  WBC 11.7* 11.2* 8.8 10.7* 9.5  NEUTROABS  --   --   --  3.6 3.2  HGB 12.4 11.4* 12.0 12.3 11.8*  HCT 39.7 36.5 38.1 38.1 36.6  MCV 76.9* 78.0* 77.9* 76.5* 76.7*  PLT 270 233 246 252 409    Basic Metabolic Panel: Recent Labs  Lab 02/09/22 0128 02/11/22 0224 02/12/22 0926 02/13/22 0200 02/14/22 0148  NA 136 138 136 138 137  K 4.1 4.4 4.3 4.4 4.6  CL 105 104 102 104 103  CO2 '23 25 24 26 27  '$ GLUCOSE 112* 126* 246* 131* 132*  BUN 26* 33* 32* 34* 31*  CREATININE 0.92 1.02* 0.98 0.93 1.04*  CALCIUM 8.5* 8.7* 8.7* 9.1 8.9  MG  --   --   --  1.7 1.7  PHOS  --   --   --  3.5 4.1    GFR: Estimated Creatinine Clearance: 34.7 mL/min ([name]) (by C-G formula based on SCr of 1.04 mg/dL (H)).  Liver Function Tests: Recent Labs  Lab 02/13/22 0200 02/14/22 0148  AST 19 18  ALT 22 21  ALKPHOS 48 46  BILITOT 0.5 0.4  PROT 6.0* 5.9*  ALBUMIN 3.2* 3.1*     CBG: Recent Labs  Lab 02/14/22 0814 02/14/22 1201 02/14/22 1721  02/14/22 2132 02/15/22 0808  GLUCAP 158* 139* 124* 200* 143*     No results found for this or any previous visit (from the past 240 hour(s)).        Radiology Studies: No results found.      Scheduled Meds:  apixaban  5 mg Oral BID   insulin aspart  0-5 Units Subcutaneous QHS   insulin aspart  0-9 Units Subcutaneous TID WC   metoprolol tartrate  12.5 mg Oral BID   metoprolol tartrate  25 mg Oral Once   pantoprazole  40 mg Oral Q1200   polyethylene glycol  17 g Oral BID   Continuous Infusions:   LOS: 12 days    Time spent: over 30 min    Fayrene Helper, MD [facility]   To contact the attending provider between 7A-7P or the covering provider during after hours 7P-7A, please log into the web site [email or web address] and access using universal [place] password  for that web site. If you do not have the password, please call the hospital operator.  02/15/2022, 10:18 AM

## 2022-02-15 NOTE — Progress Notes (Signed)
Mobility Specialist - Progress Note   02/15/22 1544  Mobility  Activity Contraindicated/medical hold   Pt HR currently in the 140s at rest. Will follow up tomorrow.   Franki Monte  Mobility Specialist Please contact via [place] or Rehab office at [phone]

## 2022-02-16 DIAGNOSIS — R110 Nausea: Secondary | ICD-10-CM | POA: Diagnosis not present

## 2022-02-16 DIAGNOSIS — Z794 Long term (current) use of insulin: Secondary | ICD-10-CM

## 2022-02-16 DIAGNOSIS — E875 Hyperkalemia: Secondary | ICD-10-CM | POA: Diagnosis not present

## 2022-02-16 DIAGNOSIS — E1165 Type 2 diabetes mellitus with hyperglycemia: Secondary | ICD-10-CM

## 2022-02-16 DIAGNOSIS — I480 Paroxysmal atrial fibrillation: Secondary | ICD-10-CM | POA: Diagnosis not present

## 2022-02-16 LAB — MAGNESIUM: Magnesium: 1.9 mg/dL (ref 1.7–2.4)

## 2022-02-16 LAB — COMPREHENSIVE METABOLIC PANEL
ALT: 25 U/L (ref 0–44)
AST: 25 U/L (ref 15–41)
Albumin: 3.4 g/dL — ABNORMAL LOW (ref 3.5–5.0)
Alkaline Phosphatase: 60 U/L (ref 38–126)
Anion gap: 12 (ref 5–15)
BUN: 49 mg/dL — ABNORMAL HIGH (ref 8–23)
CO2: 20 mmol/L — ABNORMAL LOW (ref 22–32)
Calcium: 9.4 mg/dL (ref 8.9–10.3)
Chloride: 105 mmol/L (ref 98–111)
Creatinine, Ser: 1.13 mg/dL — ABNORMAL HIGH (ref 0.44–1.00)
GFR, Estimated: 47 mL/min — ABNORMAL LOW (ref >60)
Glucose, Bld: 167 mg/dL — ABNORMAL HIGH (ref 70–99)
Potassium: 5.3 mmol/L — ABNORMAL HIGH (ref 3.5–5.1)
Sodium: 137 mmol/L (ref 135–145)
Total Bilirubin: 1 mg/dL (ref 0.3–1.2)
Total Protein: 6.4 g/dL — ABNORMAL LOW (ref 6.5–8.1)

## 2022-02-16 LAB — CBC WITH DIFFERENTIAL/PLATELET
Abs Immature Granulocytes: 0.05 10*3/uL (ref 0.00–0.07)
Basophils Absolute: 0.1 10*3/uL (ref 0.0–0.1)
Basophils Relative: 1 %
Eosinophils Absolute: 0.7 10*3/uL — ABNORMAL HIGH (ref 0.0–0.5)
Eosinophils Relative: 6 %
HCT: 44.6 % (ref 36.0–46.0)
Hemoglobin: 13.9 g/dL (ref 12.0–15.0)
Immature Granulocytes: 0 %
Lymphocytes Relative: 40 %
Lymphs Abs: 4.9 10*3/uL — ABNORMAL HIGH (ref 0.7–4.0)
MCH: 24.1 pg — ABNORMAL LOW (ref 26.0–34.0)
MCHC: 31.2 g/dL (ref 30.0–36.0)
MCV: 77.4 fL — ABNORMAL LOW (ref 80.0–100.0)
Monocytes Absolute: 0.9 10*3/uL (ref 0.1–1.0)
Monocytes Relative: 7 %
Neutro Abs: 5.7 10*3/uL (ref 1.7–7.7)
Neutrophils Relative %: 46 %
Platelets: 200 10*3/uL (ref 150–400)
RBC: 5.76 MIL/uL — ABNORMAL HIGH (ref 3.87–5.11)
RDW: 18.7 % — ABNORMAL HIGH (ref 11.5–15.5)
WBC: 12.3 10*3/uL — ABNORMAL HIGH (ref 4.0–10.5)
nRBC: 0 % (ref 0.0–0.2)

## 2022-02-16 LAB — GLUCOSE, CAPILLARY
Glucose-Capillary: 137 mg/dL — ABNORMAL HIGH (ref 70–99)
Glucose-Capillary: 153 mg/dL — ABNORMAL HIGH (ref 70–99)
Glucose-Capillary: 160 mg/dL — ABNORMAL HIGH (ref 70–99)
Glucose-Capillary: 169 mg/dL — ABNORMAL HIGH (ref 70–99)
Glucose-Capillary: 179 mg/dL — ABNORMAL HIGH (ref 70–99)
Glucose-Capillary: 196 mg/dL — ABNORMAL HIGH (ref 70–99)
Glucose-Capillary: 204 mg/dL — ABNORMAL HIGH (ref 70–99)

## 2022-02-16 LAB — PHOSPHORUS: Phosphorus: 4.8 mg/dL — ABNORMAL HIGH (ref 2.5–4.6)

## 2022-02-16 MED ORDER — AMIODARONE LOAD VIA INFUSION
150.0000 mg | Freq: Once | INTRAVENOUS | Status: AC
  Administered 2022-02-16: 150 mg via INTRAVENOUS
  Filled 2022-02-16: qty 83.34

## 2022-02-16 MED ORDER — AMIODARONE HCL IN DEXTROSE 360-4.14 MG/200ML-% IV SOLN
30.0000 mg/h | INTRAVENOUS | Status: DC
  Administered 2022-02-16 – 2022-02-17 (×2): 30 mg/h via INTRAVENOUS
  Filled 2022-02-16 (×2): qty 200

## 2022-02-16 MED ORDER — SODIUM ZIRCONIUM CYCLOSILICATE 10 G PO PACK
10.0000 g | PACK | Freq: Once | ORAL | Status: AC
  Administered 2022-02-16: 10 g via ORAL
  Filled 2022-02-16: qty 1

## 2022-02-16 MED ORDER — AMIODARONE HCL IN DEXTROSE 360-4.14 MG/200ML-% IV SOLN
60.0000 mg/h | INTRAVENOUS | Status: AC
  Administered 2022-02-16 (×2): 60 mg/h via INTRAVENOUS
  Filled 2022-02-16 (×2): qty 200

## 2022-02-16 NOTE — Progress Notes (Signed)
PT Cancellation Note  Patient Details Name: [name] MRN: [id number] DOB: 1932/12/10   Cancelled Treatment:    Reason Eval/Treat Not Completed: (P) Patient declined, no reason specified Pt reports she just returned to bed and asked if PT could come back later. PT will follow back as able.   Tyjuan Demetro B. Migdalia Dk PT, DPT Acute Rehabilitation Services Please use secure chat or  Call Office [phone]    [place] 02/16/2022, 11:50 AM

## 2022-02-16 NOTE — Progress Notes (Signed)
PROGRESS NOTE    [name]  ZDG:[id number] DOB: August 18, 1932 DOA: 02/03/2022 PCP: Antony Contras, MD  Chief Complaint  Patient presents with   Emesis   Diarrhea    Brief Narrative:  89/F with history of DM2, hypertension, AAA, PVD, TIA, cerebral aneurysm status post coiling, GERD, duodenitis, IBS, anxiety.  Admitted last month in early December for new onset A-fib with RVR and was started on Coreg and Eliquis. She presented to the ED 1/4 with nausea vomiting and diarrhea. She was treated for suspected viral gastroenteritis.  Hospitalization complicated by volume overload and afib with RVR.    Assessment & Plan:   Atrial fibrillation with RVR -Recent echo with preserved EF, required Cardizem drip on admission -Recurrent A-fib RVR this admission Cardiology is following and managing.  She was placed on metoprolol but developed bradycardia and orthostatic hypotension so dose to be reduced.  Went back into RVR on 1/16. Currently on metoprolol 25 mg 3 times a day. Cardiology considering amiodarone. Continue apixaban. Heart rate seems to be in good control this morning though still in atrial fibrillation.   Orthostatic hypotension Improved after metoprolol dose was adjusted. Seen by Occupational Therapy.  Orthostatic vital signs noted to be stable.  Some increase in heart rate is noted.  Blood pressure did not drop.  Nausea/vomiting/diarrhea -Suspect viral gastroenteritis, flu and COVID PCR negative, CT abdomen pelvis without acute findings -Continue supportive care, no further diarrhea or vomiting   Acute on chronic diastolic CHF Mild hypoxia -Likely triggered by A-fib, has been taking Lasix 3 times a week, chest x-ray with pulmonary vascular congestion and mild edema -Treated with 2 doses of IV Lasix, now euvolemic, hold further diuretics -Last echo 12/23 with EF 55-60%, grade 1 DD -Incentive spirometry, weaned off O2    Elevated troponin -Likely secondary to demand in the  setting of CHF, A-fib -Do not suspect ACS   Hypertensive urgency -Systolic as high as 595, now improved  -Continue metoprolol (dose reduced), diuretics as above   Hyperkalemia Slightly elevated potassium level noted this morning.  Not noted to be on any potassium supplements.  No hemolysis mentioned.  Will give Lokelma x 1 and recheck labs tomorrow.  Insulin-dependent diabetes -A1c 6.2 on 01/02/2022 -Sensitive sliding scale insulin ACHS Not noted to be on long-acting insulin at this time.  DVT prophylaxis: eliquis Code Status: DNR Family Communication: none Disposition: SNF when medically stable  Status is: Inpatient Remains inpatient appropriate because: pending further improvemetn   Consultants:  cards  Procedures:  none  Antimicrobials:  Anti-infectives (From admission, onward)    None       Subjective: Feels well overall.  Denies any chest pain shortness of breath.  Objective: Vitals:   02/15/22 2248 02/16/22 0500 02/16/22 0553 02/16/22 0825  BP: (!) 143/89  (!) 124/99 (!) 126/99  Pulse: 66  (!) 101 87  Resp: '20  20 18  '$ Temp:    97.7 F (36.5 C)  TempSrc:    Oral  SpO2: 99%  94% 98%  Weight:  64.2 kg    Height:        Intake/Output Summary (Last 24 hours) at 02/16/2022 1025 Last data filed at 02/16/2022 0550 Gross per 24 hour  Intake --  Output 1250 ml  Net -1250 ml    Filed Weights   02/14/22 0528 02/15/22 0314 02/16/22 0500  Weight: 65.6 kg 65.5 kg 64.2 kg    Examination:  General appearance: Awake alert.  In no distress Resp: Clear to  auscultation bilaterally.  Normal effort Cardio: S1-S2 is irregularly irregular GI: Abdomen is soft.  Nontender nondistended.  Bowel sounds are present normal.  No masses organomegaly Extremities: No edema.  Full range of motion of lower extremities. Neurologic: Alert and oriented x3.  No focal neurological deficits.     Data Reviewed: I have personally reviewed following labs and imaging  studies  CBC: Recent Labs  Lab 02/11/22 0224 02/12/22 0926 02/13/22 0200 02/14/22 0148 02/16/22 0244  WBC 11.2* 8.8 10.7* 9.5 12.3*  NEUTROABS  --   --  3.6 3.2 5.7  HGB 11.4* 12.0 12.3 11.8* 13.9  HCT 36.5 38.1 38.1 36.6 44.6  MCV 78.0* 77.9* 76.5* 76.7* 77.4*  PLT 233 246 252 229 200     Basic Metabolic Panel: Recent Labs  Lab 02/11/22 0224 02/12/22 0926 02/13/22 0200 02/14/22 0148 02/16/22 0244  NA 138 136 138 137 137  K 4.4 4.3 4.4 4.6 5.3*  CL 104 102 104 103 105  CO2 '25 24 26 27 '$ 20*  GLUCOSE 126* 246* 131* 132* 167*  BUN 33* 32* 34* 31* 49*  CREATININE 1.02* 0.98 0.93 1.04* 1.13*  CALCIUM 8.7* 8.7* 9.1 8.9 9.4  MG  --   --  1.7 1.7 1.9  PHOS  --   --  3.5 4.1 4.8*     GFR: Estimated Creatinine Clearance: 31.6 mL/min (A) (by C-G formula based on SCr of 1.13 mg/dL (H)).  Liver Function Tests: Recent Labs  Lab 02/13/22 0200 02/14/22 0148 02/16/22 0244  AST '19 18 25  '$ ALT '22 21 25  '$ ALKPHOS 48 46 60  BILITOT 0.5 0.4 1.0  PROT 6.0* 5.9* 6.4*  ALBUMIN 3.2* 3.1* 3.4*      CBG: Recent Labs  Lab 02/14/22 2132 02/15/22 0808 02/15/22 1121 02/16/22 0046 02/16/22 0757  GLUCAP 200* 143* 203* 196* 179*     Radiology Studies: No results found.  Scheduled Meds:  apixaban  5 mg Oral BID   insulin aspart  0-5 Units Subcutaneous QHS   insulin aspart  0-9 Units Subcutaneous TID WC   metoprolol tartrate  25 mg Oral Q8H   pantoprazole  40 mg Oral Q1200   polyethylene glycol  17 g Oral BID   Continuous Infusions:   LOS: 13 days     Bonnielee Haff, MD [facility]   02/16/2022, 10:25 AM

## 2022-02-16 NOTE — Progress Notes (Signed)
Occupational Therapy Treatment Patient Details Name: [name] MRN: [id number] DOB: Sep 10, 1932 Today's Date: 02/16/2022   History of present illness Pt is an 89 y.o. female admitted 02/03/21 with nausea, vomiting, diarrhea; suspect viral gastroenteritis. Pt also with HTN, afib with RVR. Recent admission 12/2021 for new onset afib with RVR. PMH includes DM2, HTN, AAA, PVD, TIA, cerebral aneurysm s/p coiling, IBS, duodenitis, anxiety.   OT comments  Pt progressing towards goals, session limited by dizziness, pt needing min guard-min A for LB ADLs, able to wash BLE's and don undergarment sitting/standing at EOB. supervision for bed mobility and transfers with RW. Pt reporting mild dizziness/nausea, however BP WNL (see below), HR up to 125bpm during session. Pt presenting with impairments listed below, will follow acutely. Continue to recommend SNF at d/c.   BP seated pre-transfer 136/70 (91) BP seated post-transfer 152/118 (130) BP seated x3 min post transfer 156/91 (110)   Recommendations for follow up therapy are one component of a multi-disciplinary discharge planning process, led by the attending physician.  Recommendations may be updated based on patient status, additional functional criteria and insurance authorization.    Follow Up Recommendations  Skilled nursing-short term rehab (<3 hours/day)     Assistance Recommended at Discharge Frequent or constant Supervision/Assistance  Patient can return home with the following  A little help with walking and/or transfers;A little help with bathing/dressing/bathroom;Assistance with cooking/housework;Assist for transportation;Help with stairs or ramp for entrance   Equipment Recommendations  None recommended by OT    Recommendations for Other Services PT consult    Precautions / Restrictions Precautions Precautions: Fall Precaution Comments: monitor BP and HR Restrictions Weight Bearing Restrictions: No       Mobility Bed  Mobility Overal bed mobility: Needs Assistance Bed Mobility: Supine to Sit, Sit to Supine     Supine to sit: Supervision, HOB elevated Sit to supine: Supervision        Transfers Overall transfer level: Needs assistance Equipment used: Rolling walker (2 wheels) Transfers: Sit to/from Stand Sit to Stand: Supervision                 Balance Overall balance assessment: Needs assistance Sitting-balance support: No upper extremity supported, Feet supported Sitting balance-Leahy Scale: Good Sitting balance - Comments: completes seated ADLs without LOB   Standing balance support: Bilateral upper extremity supported, Reliant on assistive device for balance Standing balance-Leahy Scale: Poor Standing balance comment: unable to let go of RW to pull up underwear                           ADL either performed or assessed with clinical judgement   ADL Overall ADL's : Needs assistance/impaired             Lower Body Bathing: Minimal assistance;Sitting/lateral leans;Sit to/from stand Lower Body Bathing Details (indicate cue type and reason): washes peri area/lower legs while seated EOB, posterior lean Upper Body Dressing : Minimal assistance;Sitting   Lower Body Dressing: Minimal assistance;Sit to/from stand Lower Body Dressing Details (indicate cue type and reason): donning underwear Toilet Transfer: Min guard;Rolling walker (2 wheels);Ambulation;Regular Glass blower/designer Details (indicate cue type and reason): simulated via functional mobility Toileting- Clothing Manipulation and Hygiene: Supervision/safety;Sitting/lateral lean       Functional mobility during ADLs: Min guard;Rolling walker (2 wheels)      Extremity/Trunk Assessment Upper Extremity Assessment Upper Extremity Assessment: Generalized weakness   Lower Extremity Assessment Lower Extremity Assessment: Defer to PT evaluation  Vision   Vision Assessment?: No apparent visual  deficits   Perception Perception Perception: Not tested   Praxis Praxis Praxis: Not tested    Cognition Arousal/Alertness: Awake/alert Behavior During Therapy: WFL for tasks assessed/performed Overall Cognitive Status: Within Functional Limits for tasks assessed                                 General Comments: WFL  for simple tasks assessed, would benefit from higher level assessment        Exercises      Shoulder Instructions       General Comments HR up to 125bpm with seated ADL, multiple stands, and chair transfer    Pertinent Vitals/ Pain       Pain Assessment Pain Assessment: No/denies pain Pain Intervention(s): Monitored during session  Home Living                                          Prior Functioning/Environment              Frequency  Min 2X/week        Progress Toward Goals  OT Goals(current goals can now be found in the care plan section)  Progress towards OT goals: Progressing toward goals  Acute Rehab OT Goals Patient Stated Goal: none stated OT Goal Formulation: With patient Time For Goal Achievement: 02/21/22 Potential to Achieve Goals: Good ADL Goals Pt Will Perform Lower Body Bathing: sit to/from stand;with supervision Pt Will Perform Lower Body Dressing: sit to/from stand;with supervision Pt Will Transfer to Toilet: with supervision;ambulating;regular height toilet Additional ADL Goal #1: pt will indep verbalize at least 3 energy conservation strategies to increase safety at d/c  Plan Frequency remains appropriate;Discharge plan remains appropriate    Co-evaluation                 AM-PAC OT "6 Clicks" Daily Activity     Outcome Measure   Help from another person eating meals?: None Help from another person taking care of personal grooming?: A Little Help from another person toileting, which includes using toliet, bedpan, or urinal?: A Lot Help from another person bathing (including  washing, rinsing, drying)?: A Lot Help from another person to put on and taking off regular upper body clothing?: A Little Help from another person to put on and taking off regular lower body clothing?: A Lot 6 Click Score: 16    End of Session Equipment Utilized During Treatment: Rolling walker (2 wheels)  OT Visit Diagnosis: Unsteadiness on feet (R26.81);Muscle weakness (generalized) (M62.81)   Activity Tolerance Patient tolerated treatment well   Patient Left with call bell/phone within reach;in chair;with chair alarm set   Nurse Communication Mobility status        Time: 0258-5277 OT Time Calculation (min): 21 min  Charges: OT General Charges $OT Visit: 1 Visit OT Treatments $Self Care/Home Management : 8-22 mins  Renaye Rakers, OTD, OTR/L SecureChat Preferred Acute Rehab [phone]   Renaye Rakers [place] 02/16/2022, 10:17 AM

## 2022-02-16 NOTE — Progress Notes (Addendum)
Rounding Note    Patient Name: [name] Date of Encounter: 02/16/2022  [facility] Cardiologist: Fransico Him, MD   Subjective   Re-engage for Afib RVR. Converted to RVR 02/15/22 0840 with rates in the 140s, treated with IV lopressor and now on 25 mg lopressor TID.     Inpatient Medications    Scheduled Meds:  apixaban  5 mg Oral BID   insulin aspart  0-5 Units Subcutaneous QHS   insulin aspart  0-9 Units Subcutaneous TID WC   metoprolol tartrate  25 mg Oral Q8H   pantoprazole  40 mg Oral Q1200   polyethylene glycol  17 g Oral BID   Continuous Infusions:  PRN Meds: acetaminophen **OR** acetaminophen, ondansetron   Vital Signs    Vitals:   02/15/22 2021 02/15/22 2248 02/16/22 0500 02/16/22 0553  BP: (!) 149/82 (!) 143/89  (!) 124/99  Pulse: 98 66  (!) 101  Resp: (!) '24 20  20  '$ Temp: 98.1 F (36.7 C)     TempSrc: Oral     SpO2: 99% 99%  94%  Weight:   64.2 kg   Height:        Intake/Output Summary (Last 24 hours) at 02/16/2022 0749 Last data filed at 02/16/2022 0550 Gross per 24 hour  Intake --  Output 1250 ml  Net -1250 ml      02/16/2022    5:00 AM 02/15/2022    3:14 AM 02/14/2022    5:28 AM  Last 3 Weights  Weight (lbs) 141 lb 8.6 oz 144 lb 6.4 oz 144 lb 10 oz  Weight (kg) 64.2 kg 65.5 kg 65.6 kg      Telemetry    Converted from sinus bradycardia to Afib RVR 02/15/22 at 0840 with rates in the 140s, currently in the 80-90s  - Personally Reviewed  ECG    Afib with VR 136 - Personally Reviewed  Physical Exam   GEN: No acute distress.   Neck: No JVD Cardiac: irregularly irregular, no murmurs, rubs, or gallops.  Respiratory: Clear to auscultation bilaterally. GI: Soft, nontender, non-distended  MS: mild LE edema with compression socks on Neuro:  Nonfocal  Psych: Normal affect   Labs    High Sensitivity Troponin:   Recent Labs  Lab 02/03/22 1420 02/03/22 1600  TROPONINIHS 163* 147*     Chemistry Recent Labs  Lab  02/13/22 0200 02/14/22 0148 02/16/22 0244  NA 138 137 137  K 4.4 4.6 5.3*  CL 104 103 105  CO2 26 27 20*  GLUCOSE 131* 132* 167*  BUN 34* 31* 49*  CREATININE 0.93 1.04* 1.13*  CALCIUM 9.1 8.9 9.4  MG 1.7 1.7 1.9  PROT 6.0* 5.9* 6.4*  ALBUMIN 3.2* 3.1* 3.4*  AST '19 18 25  '$ ALT '22 21 25  '$ ALKPHOS 48 46 60  BILITOT 0.5 0.4 1.0  GFRNONAA 59* 52* 47*  ANIONGAP '8 7 12    '$ Lipids No results for input(s): "CHOL", "TRIG", "HDL", "LABVLDL", "LDLCALC", "CHOLHDL" in the last 168 hours.  Hematology Recent Labs  Lab 02/13/22 0200 02/14/22 0148 02/16/22 0244  WBC 10.7* 9.5 12.3*  RBC 4.98 4.77 5.76*  HGB 12.3 11.8* 13.9  HCT 38.1 36.6 44.6  MCV 76.5* 76.7* 77.4*  MCH 24.7* 24.7* 24.1*  MCHC 32.3 32.2 31.2  RDW 17.5* 17.5* 18.7*  PLT 252 229 200   Thyroid No results for input(s): "TSH", "FREET4" in the last 168 hours.  BNPNo results for input(s): "BNP", "PROBNP" in the  last 168 hours.  DDimer No results for input(s): "DDIMER" in the last 168 hours.   Radiology    No results found.  Cardiac Studies   Echo 01/03/22:  1. Basal inferior hypokinesis/akinesis. Poor acoustic windows Definity  used.     Compared to echo from June 2023 basal inferior changes are more  prominent. Left ventricular ejection fraction, by estimation, is 55 to  60%. The left ventricle has normal function. Left ventricular diastolic  parameters are consistent with Grade I  diastolic dysfunction (impaired relaxation).   2. Right ventricular systolic function is low normal. The right  ventricular size is normal. There is normal pulmonary artery systolic  pressure.   3. Left atrial size was mildly dilated.   4. Mild mitral valve regurgitation.   5. AV is mildly thickened, calcified There is very mildly restricted  motion of the valve . Peak and mean gradients through the valve are 11 and  5 mm Hg respectively. No signficant change when compared to echo from June  2023. Marland Kitchen The aortic valve is  tricuspid.    6. There is mild dilatation of the ascending aorta, measuring 39 mm.   7. The inferior vena cava is normal in size with greater than 50%  respiratory variability, suggesting right atrial pressure of 3 mmHg.   Patient Profile     89 y.o. female with a hx of hypertension, diabetes, peptic ulcer disease, carotid artery disease, HFpEF, ascending thoracic aortic aneurysm, MVP, remote cerebral aneurysm status post coiling, colon CA status post resection, paroxysmal atrial fibrillation who was seen 02/07/2022 for the evaluation of A-fib RVR at the request of Dr. Broadus John.    Coreg and cardizem D/C'ed, converted to sinus bradycardia, lopressor reduced to 50 mg BID. Cardiology asked to re-engage as BB has been reduced over the past several days due to bradycardia and symptomatic orthostasis resulting in return of Afib RVR   Assessment & Plan    Paroxysmal atrial fibrillation Afib with RVR Diagnosed during admission 12/2021 with focus on rate control with coreg. She presented with acute illness with RVR managed with both BB and cardizem --> converted to sinus bradycardia and cardizem stopped, maintained on lopressor 50 mg BID.  Cardiology asked to re-engage for return of Afib RVR with reduction in BB dose. - telemetry with Afib now in the 80-90s on lopressor 25 mg TID - she feels better today than yesterday - suspect we may need to revisit amiodarone  She reports some nausea but no actual emesis last night, no nausea today. Discussed amiodarone and possible side effects of nausea. She is agreeable. She reiterates not wanting to be electrically cardioverted. Hopefully amiodarone will allow [place] to reduce dose of BB and therefore reduce bradycardia if she converts as well as improve orthostatic hypotension (need readings today). Will discuss IV vs PO loading with attending.   Chronic anticoagulation Maintained on eliquis   Nausea, vomiting, diarrhea Per primary - treated for viral gastroenteritis, still  with intermittent nausea   HFpEF No longer requiring diuresis, but suspect we may need targeted doses given return of Afib RVR   Hypertension  Would  not be aggressive with BP control with concern for symptomatic orthostasis - check orthostatic vitals this morning      For questions or updates, please contact [place] Please consult [email or web address] for contact info under        Signed, Ledora Bottcher, PA  02/16/2022, 7:49 AM    I have examined the patient and  reviewed assessment and plan and discussed with patient.  Agree with above as stated.    Patient feeling better.  No palpitations at this time as her heart rate is somewhat better rate controlled.  I agree with trying to restore sinus rhythm with amiodarone.  Would prefer IV amiodarone given her issues with nausea.  Once she has had a reasonable load, could switch to a lower dose oral amiodarone.  Larae Grooms

## 2022-02-16 NOTE — TOC Progression Note (Addendum)
Transition of Care [facility]) - Progression Note    Patient Details  Name: [name] MRN: [id number] Date of Birth: [birth year]  Transition of Care [facility] CM/SW [place], [place] Phone Number: 02/16/2022, 1:04 PM  Clinical Narrative:     Patient has SNF bed at [facility] and rehab. Insurance authorization approved from 1/16-1/18. CSW will continue to follow and assist with patients dc planning needs.   Expected Discharge Plan: [place] Barriers to Discharge: Continued Medical Work up  Expected Discharge Plan and Services In-house Referral: Clinical Social Work     Living arrangements for the past 2 months: Single Family Home                                       Social Determinants of Health (SDOH) Interventions SDOH Screenings   Food Insecurity: No Food Insecurity (02/05/2022)  Housing: Low Risk  (02/05/2022)  Transportation Needs: No Transportation Needs (02/05/2022)  Utilities: Not At Risk (02/05/2022)  Tobacco Use: Low Risk  (02/03/2022)    Readmission Risk Interventions     No data to display

## 2022-02-16 NOTE — Progress Notes (Signed)
PT Cancellation Note  Patient Details Name: [name] MRN: [id number] DOB: December 14, 1932   Cancelled Treatment:    Reason Eval/Treat Not Completed: (P) Patient declined, no reason specified. Pt reports she just started amiodarone drip and does not want to work with therapy now. Asks if pt will come back tomorrow.   Araceli Coufal B. Migdalia Dk PT, DPT Acute Rehabilitation Services Please use secure chat or  Call Office [phone]     [place] 02/16/2022, 4:06 PM

## 2022-02-17 DIAGNOSIS — Z794 Long term (current) use of insulin: Secondary | ICD-10-CM | POA: Diagnosis not present

## 2022-02-17 DIAGNOSIS — I480 Paroxysmal atrial fibrillation: Secondary | ICD-10-CM | POA: Diagnosis not present

## 2022-02-17 DIAGNOSIS — E875 Hyperkalemia: Secondary | ICD-10-CM | POA: Diagnosis not present

## 2022-02-17 DIAGNOSIS — E1165 Type 2 diabetes mellitus with hyperglycemia: Secondary | ICD-10-CM | POA: Diagnosis not present

## 2022-02-17 LAB — BASIC METABOLIC PANEL
Anion gap: 8 (ref 5–15)
BUN: 53 mg/dL — ABNORMAL HIGH (ref 8–23)
CO2: 25 mmol/L (ref 22–32)
Calcium: 9.2 mg/dL (ref 8.9–10.3)
Chloride: 101 mmol/L (ref 98–111)
Creatinine, Ser: 1.35 mg/dL — ABNORMAL HIGH (ref 0.44–1.00)
GFR, Estimated: 38 mL/min — ABNORMAL LOW (ref >60)
Glucose, Bld: 160 mg/dL — ABNORMAL HIGH (ref 70–99)
Potassium: 4.5 mmol/L (ref 3.5–5.1)
Sodium: 134 mmol/L — ABNORMAL LOW (ref 135–145)

## 2022-02-17 LAB — GLUCOSE, CAPILLARY
Glucose-Capillary: 160 mg/dL — ABNORMAL HIGH (ref 70–99)
Glucose-Capillary: 214 mg/dL — ABNORMAL HIGH (ref 70–99)
Glucose-Capillary: 129 mg/dL — ABNORMAL HIGH (ref 70–99)
Glucose-Capillary: 224 mg/dL — ABNORMAL HIGH (ref 70–99)

## 2022-02-17 MED ORDER — AMIODARONE HCL 200 MG PO TABS
200.0000 mg | ORAL_TABLET | Freq: Two times a day (BID) | ORAL | Status: DC
  Administered 2022-02-17 – 2022-02-18 (×3): 200 mg via ORAL
  Filled 2022-02-17 (×3): qty 1

## 2022-02-17 MED ORDER — SODIUM CHLORIDE 0.45 % IV SOLN: INTRAVENOUS | Status: AC

## 2022-02-17 MED ORDER — METOPROLOL TARTRATE 12.5 MG HALF TABLET
12.5000 mg | ORAL_TABLET | Freq: Two times a day (BID) | ORAL | Status: DC
  Administered 2022-02-17 – 2022-02-18 (×2): 12.5 mg via ORAL
  Filled 2022-02-17 (×3): qty 1

## 2022-02-17 NOTE — Progress Notes (Addendum)
Rounding Note    Patient Name: [name] Date of Encounter: 02/17/2022  [facility] Cardiologist: Fransico Him, MD   Subjective   No acute overnight events. She converted back to sinus rhythm last night. No chest pain or shortness of breath. She denies any recurrent vomiting but continues to have some nausea. It sounds like this is likely a chronic issues.   Inpatient Medications    Scheduled Meds:  apixaban  5 mg Oral BID   insulin aspart  0-5 Units Subcutaneous QHS   insulin aspart  0-9 Units Subcutaneous TID WC   metoprolol tartrate  25 mg Oral Q8H   pantoprazole  40 mg Oral Q1200   polyethylene glycol  17 g Oral BID   Continuous Infusions:  amiodarone Stopped (02/17/22 0055)   PRN Meds: acetaminophen **OR** acetaminophen, ondansetron   Vital Signs    Vitals:   02/17/22 0047 02/17/22 0420 02/17/22 0435 02/17/22 0735  BP: 135/62 139/63  (!) 141/57  Pulse: (!) 51 (!) 51  (!) 56  Resp: 20 (!) 22  20  Temp: (!) 97.5 F (36.4 C) 97.6 F (36.4 C)  97.6 F (36.4 C)  TempSrc: Oral Oral  Oral  SpO2: 96% 98%  98%  Weight:   64.1 kg   Height:        Intake/Output Summary (Last 24 hours) at 02/17/2022 0949 Last data filed at 02/17/2022 0848 Gross per 24 hour  Intake 738.17 ml  Output 750 ml  Net -11.83 ml      02/17/2022    4:35 AM 02/16/2022    5:00 AM 02/15/2022    3:14 AM  Last 3 Weights  Weight (lbs) 141 lb 5 oz 141 lb 8.6 oz 144 lb 6.4 oz  Weight (kg) 64.1 kg 64.2 kg 65.5 kg      Telemetry    Converted back to sinus rhythm around 11pm on 12/18/2022. Currently in sinus bradycardia with rates in the high 40s to 50s. - Personally Reviewed  ECG    No new ECG tracing today. - Personally Reviewed  Physical Exam   GEN: No acute distress.   Neck: No JVD. Cardiac: Bradycardic with normal rhythm. No murmurs, rubs, or gallops.  Respiratory: No increased work of breathing. Faint crackles noted in left base but otherwise lungs clear to  auscultation.  GI: Soft, non-distended, and non-tender. MS: Trace to 1+ edema of bilateral lower extremities. No deformity. Skin: Warm and dry. Neuro:  No focal deficits. Psych: Normal affect. Responds appropriately.  Labs    High Sensitivity Troponin:   Recent Labs  Lab 02/03/22 1420 02/03/22 1600  TROPONINIHS 163* 147*     Chemistry Recent Labs  Lab 02/13/22 0200 02/14/22 0148 02/16/22 0244 02/17/22 0200  NA 138 137 137 134*  K 4.4 4.6 5.3* 4.5  CL 104 103 105 101  CO2 26 27 20* 25  GLUCOSE 131* 132* 167* 160*  BUN 34* 31* 49* 53*  CREATININE 0.93 1.04* 1.13* 1.35*  CALCIUM 9.1 8.9 9.4 9.2  MG 1.7 1.7 1.9  --   PROT 6.0* 5.9* 6.4*  --   ALBUMIN 3.2* 3.1* 3.4*  --   AST '19 18 25  '$ --   ALT '22 21 25  '$ --   ALKPHOS 48 46 60  --   BILITOT 0.5 0.4 1.0  --   GFRNONAA 59* 52* 47* 38*  ANIONGAP '8 7 12 8    '$ Lipids No results for input(s): "CHOL", "TRIG", "HDL", "LABVLDL", "LDLCALC", "  CHOLHDL" in the last 168 hours.  Hematology Recent Labs  Lab 02/13/22 0200 02/14/22 0148 02/16/22 0244  WBC 10.7* 9.5 12.3*  RBC 4.98 4.77 5.76*  HGB 12.3 11.8* 13.9  HCT 38.1 36.6 44.6  MCV 76.5* 76.7* 77.4*  MCH 24.7* 24.7* 24.1*  MCHC 32.3 32.2 31.2  RDW 17.5* 17.5* 18.7*  PLT 252 229 200   Thyroid No results for input(s): "TSH", "FREET4" in the last 168 hours.  BNPNo results for input(s): "BNP", "PROBNP" in the last 168 hours.  DDimer No results for input(s): "DDIMER" in the last 168 hours.   Radiology    No results found.  Cardiac Studies   Echocardiogram 01/03/2022: Impressions:  1. Basal inferior hypokinesis/akinesis. Poor acoustic windows Definity  used.     Compared to echo from June 2023 basal inferior changes are more  prominent. Left ventricular ejection fraction, by estimation, is 55 to  60%. The left ventricle has normal function. Left ventricular diastolic  parameters are consistent with Grade I  diastolic dysfunction (impaired relaxation).   2. Right  ventricular systolic function is low normal. The right  ventricular size is normal. There is normal pulmonary artery systolic  pressure.   3. Left atrial size was mildly dilated.   4. Mild mitral valve regurgitation.   5. AV is mildly thickened, calcified There is very mildly restricted  motion of the valve . Peak and mean gradients through the valve are 11 and  5 mm Hg respectively. No signficant change when compared to echo from June  2023. Marland Kitchen The aortic valve is  tricuspid.   6. There is mild dilatation of the ascending aorta, measuring 39 mm.   7. The inferior vena cava is normal in size with greater than 50%  respiratory variability, suggesting right atrial pressure of 3 mmHg.    Patient Profile     89 y.o. female with a history of HFpEF, paroxysmal atrial fibrillation on Eliquis, mitral valve prolapse, ascending thoracic aortic aneurysm, carotid artery disease, hypertension, diabetes, PUD, remote cerebral aneurysm s/p coiling, and colon cancer s/p resection who was admitted on 02/03/2022 with intractable nausea and vomiting felt to be due to viral gastroenteritis. She was also noted to have atrial fibrillation with RVR for which Cardiology was consulted. She converted to sinus bradycardia on 02/09/2022 with Diltiazem and beta-blocker. Cardiology signed off that time but patient developed recurrent atrial fibrillation on 02/15/2022 and Cardiology was asked to see again.  Assessment & Plan    Paroxysmal Atrial Fibrillation with RVR Sinus Bradycardia Patient was admitted with acute gastroenteritis and noted to have paroxysmal atrial fibrillation. Initially converted to sinus bradycardia on Diltiazem and sinus bradycardia bradycardia but went back into rapid atrial fibrillation on 02/15/2022 after reduction in beta-blocker. She was started on IV Amiodarone on 02/16/2022 and converted back to sinus bradycardia. - Maintaining sinus rhythm at this time. Rates in the high 40s to 50s. Currently in the  high 50s. - Currently on Lopressor '25mg'$  every 8 hours. Will decrease to 12.'5mg'$  twice daily given bradycardia. - Primary team stopped the IV Amiodarone earlier this morning due to bradycardia. I think we can restart this. Discussed with MD - will go ahead and switch to PO Amiodarone '200mg'$  twice daily. May decrease to '200mg'$  daily at discharge. - Continue chronic anticoagulation with Eliquis.  Chronic HFpEF Recent Echo in 12/2021 showed LVEF of 55-60% with basal inferior hypokinesis/ akinesis and grade 1 diastolic dysfunction. BNP 970 on admission. She initially received a couple of doses of  IV Lasix but has not required any diuresis since 1/5. - She has some mild lower extremity edema on exam but this is chronic for her. Otherwise, does not appear significantly volume overloaded. - She takes PO Lasix '20mg'$  on M/W/F at home. Can resume this if needed. - Continue to monitor volume status closely.  Mildly Elevated Troponin High-sensitivity troponin 163 >> 147. - No chest pain. - Troponin elevation not consistent with ACS. Suspect demand ischemia in setting of acute GI illness. No need for any additional inpatient evaluation.  Hypertension BP mildly elevated but reasonable. She has had some orthostatic hypotension this admission. - Will not aggressively treat this due to concerns with orthostatic symptoms. Will repeat orthostatic vital signs today.  AKI Creatinine has been trending up the past couple of days 0.93 >> 1.04 >> 1.13 >> 1.35.  - Continue to monitor closely. - Management per primary team.  Otherwise, per primary team: - Acute gastroenteritis with nausea/vomiting/diarrhea - Insulin dependent diabetes  - Hyperkalemia - resolved - PUD - Remote cerebral aneurysm s/p coiling - History of colon cancer   For questions or updates, please contact [place] Please consult [email or web address] for contact info under        Signed, Darreld Mclean, PA-C  02/17/2022, 9:49 AM     I have examined the patient and reviewed assessment and plan and discussed with patient.  Agree with above as stated.    Back in normal sinus rhythm.  Will decrease beta-blocker dose due to resting bradycardia.  Changed to oral amiodarone 200 mg twice daily for now.  Hopefully, can wean down to 200 mg daily by discharge.  Larae Grooms

## 2022-02-17 NOTE — Progress Notes (Signed)
Mobility Specialist - Progress Note   02/17/22 1332  Mobility  Activity Ambulated with assistance in room  Level of Assistance Minimal assist, patient does 75% or more  Assistive Device Front wheel walker  Distance Ambulated (ft) 5 ft  Activity Response Tolerated well  Mobility Referral Yes  $Mobility charge 1 Mobility    Pre-mobility: 163/56(87) BP Post-mobility:155/67(93) BP  Pt was received in bed and agreeable to mobility. Upon sitting EOB pt c/o dizziness ( BP 156/58(85)). Dizziness subsided as pt stayed EOB. Pt was able to stand and pivot with minimal assist. Pt was left in chair with all needs met and chair alarm on.   Franki Monte  Mobility Specialist Please contact via Solicitor or Rehab office at [phone]

## 2022-02-17 NOTE — Plan of Care (Signed)
  Problem: Health Behavior/Discharge Planning: Goal: Ability to identify and utilize available resources and services will improve Outcome: Progressing Goal: Ability to manage health-related needs will improve Outcome: Progressing   Problem: Skin Integrity: Goal: Risk for impaired skin integrity will decrease Outcome: Progressing   Problem: Clinical Measurements: Goal: Respiratory complications will improve Outcome: Progressing   Problem: Clinical Measurements: Goal: Cardiovascular complication will be avoided Outcome: Progressing   Problem: Activity: Goal: Risk for activity intolerance will decrease Outcome: Progressing   Problem: Nutrition: Goal: Adequate nutrition will be maintained Outcome: Progressing   Problem: Pain Managment: Goal: General experience of comfort will improve Outcome: Progressing   Problem: Safety: Goal: Ability to remain free from injury will improve Outcome: Progressing   Problem: Skin Integrity: Goal: Risk for impaired skin integrity will decrease Outcome: Progressing

## 2022-02-17 NOTE — TOC Progression Note (Signed)
Transition of Care [facility]) - Progression Note    Patient Details  Name: [name] MRN: [id number] Date of Birth: 18-Nov-1932  Transition of Care [facility]) CM/SW [place], [place] Phone Number: 02/17/2022, 11:21 AM  Clinical Narrative:     CSW called patients insurance authorization to see if  CSW would need to restart insurance authorization if patient is medically ready for dc tomorrow. Patients insurance informed CSW that there is a 6 day grace period and that patients insurance authorization is good through Sunday 1/21. CSW informed facility. CSW will continue to follow and assist with patients dc planning needs.  Expected Discharge Plan: [place] Barriers to Discharge: Continued Medical Work up  Expected Discharge Plan and Services In-house Referral: Clinical Social Work     Living arrangements for the past 2 months: Single Family Home                                       Social Determinants of Health (SDOH) Interventions SDOH Screenings   Food Insecurity: No Food Insecurity (02/05/2022)  Housing: Low Risk  (02/05/2022)  Transportation Needs: No Transportation Needs (02/05/2022)  Utilities: Not At Risk (02/05/2022)  Tobacco Use: Low Risk  (02/03/2022)    Readmission Risk Interventions     No data to display

## 2022-02-17 NOTE — Progress Notes (Signed)
PROGRESS NOTE    [name]  YYT:[id number] DOB: 08-Jun-1932 DOA: 02/03/2022 PCP: Antony Contras, MD  Chief Complaint  Patient presents with   Emesis   Diarrhea    Brief Narrative:  89/F with history of DM2, hypertension, AAA, PVD, TIA, cerebral aneurysm status post coiling, GERD, duodenitis, IBS, anxiety.  Admitted last month in early December for new onset A-fib with RVR and was started on Coreg and Eliquis. She presented to the ED 1/4 with nausea vomiting and diarrhea. She was treated for suspected viral gastroenteritis.  Hospitalization complicated by volume overload and afib with RVR.    Assessment & Plan:   Atrial fibrillation with RVR -Recent echo with preserved EF, required Cardizem drip on admission -Recurrent A-fib RVR this admission Cardiology is following and managing.  She was placed on metoprolol but developed bradycardia and orthostatic hypotension so dose to be reduced.  Went back into RVR on 1/16. Subsequently placed on amiodarone infusion. Management per cardiology.  Bradycardia noted overnight.  Dose of metoprolol decreased by cardiology. Continue apixaban.   Orthostatic hypotension Improved after metoprolol dose was adjusted.  Acute kidney injury Rise in BUN and creatinine noted.  Not thought to be volume overloaded by cardiology.  She could be somewhat volume depleted.  She was given furosemide during earlier part of this admission.  Will gently hydrate her for few hours and recheck labs tomorrow.  Nausea/vomiting/diarrhea -Suspect viral gastroenteritis, flu and COVID PCR negative, CT abdomen pelvis without acute findings -Continue supportive care, no further diarrhea or vomiting   Acute on chronic diastolic CHF Mild hypoxia -Likely triggered by A-fib, has been taking Lasix 3 times a week, chest x-ray with pulmonary vascular congestion and mild edema -Treated with 2 doses of IV Lasix.  Now could be hypovolemic as discussed above. -Last echo 12/23 with EF  55-60%, grade 1 DD -Incentive spirometry, weaned off O2    Elevated troponin -Likely secondary to demand in the setting of CHF, A-fib -Do not suspect ACS   Hypertensive urgency -Systolic as high as 275, now improved  -Continue metoprolol (dose reduced), diuretics as above   Hyperkalemia Slightly elevated potassium level noted yesterday morning.  Improved this morning after Lokelma.  Insulin-dependent diabetes -A1c 6.2 on 01/02/2022 -Sensitive sliding scale insulin ACHS Not noted to be on long-acting insulin at this time.  DVT prophylaxis: eliquis Code Status: DNR Family Communication: none Disposition: SNF when medically stable  Status is: Inpatient Remains inpatient appropriate because: pending further improvemetn   Consultants:  cards  Procedures:  none  Antimicrobials:  Anti-infectives (From admission, onward)    None       Subjective: Feels well.  Denies any complaints.  No chest pain palpitations or shortness of breath.  Objective: Vitals:   02/17/22 0047 02/17/22 0420 02/17/22 0435 02/17/22 0735  BP: 135/62 139/63  (!) 141/57  Pulse: (!) 51 (!) 51  (!) 56  Resp: 20 (!) 22  20  Temp: (!) 97.5 F (36.4 C) 97.6 F (36.4 C)  97.6 F (36.4 C)  TempSrc: Oral Oral  Oral  SpO2: 96% 98%  98%  Weight:   64.1 kg   Height:        Intake/Output Summary (Last 24 hours) at 02/17/2022 1111 Last data filed at 02/17/2022 0900 Gross per 24 hour  Intake 888.17 ml  Output 750 ml  Net 138.17 ml    Filed Weights   02/15/22 0314 02/16/22 0500 02/17/22 0435  Weight: 65.5 kg 64.2 kg 64.1 kg  Examination:  General appearance: Awake alert.  In no distress Resp: Clear to auscultation bilaterally.  Normal effort Cardio: S1-S2 is normal regular.  No S3-S4.  No rubs murmurs or bruit GI: Abdomen is soft.  Nontender nondistended.  Bowel sounds are present normal.  No masses organomegaly    Data Reviewed: I have personally reviewed following labs and imaging  studies  CBC: Recent Labs  Lab 02/11/22 0224 02/12/22 0926 02/13/22 0200 02/14/22 0148 02/16/22 0244  WBC 11.2* 8.8 10.7* 9.5 12.3*  NEUTROABS  --   --  3.6 3.2 5.7  HGB 11.4* 12.0 12.3 11.8* 13.9  HCT 36.5 38.1 38.1 36.6 44.6  MCV 78.0* 77.9* 76.5* 76.7* 77.4*  PLT 233 246 252 229 200     Basic Metabolic Panel: Recent Labs  Lab 02/12/22 0926 02/13/22 0200 02/14/22 0148 02/16/22 0244 02/17/22 0200  NA 136 138 137 137 134*  K 4.3 4.4 4.6 5.3* 4.5  CL 102 104 103 105 101  CO2 '24 26 27 '$ 20* 25  GLUCOSE 246* 131* 132* 167* 160*  BUN 32* 34* 31* 49* 53*  CREATININE 0.98 0.93 1.04* 1.13* 1.35*  CALCIUM 8.7* 9.1 8.9 9.4 9.2  MG  --  1.7 1.7 1.9  --   PHOS  --  3.5 4.1 4.8*  --      GFR: Estimated Creatinine Clearance: 26.5 mL/min (A) (by C-G formula based on SCr of 1.35 mg/dL (H)).  Liver Function Tests: Recent Labs  Lab 02/13/22 0200 02/14/22 0148 02/16/22 0244  AST '19 18 25  '$ ALT '22 21 25  '$ ALKPHOS 48 46 60  BILITOT 0.5 0.4 1.0  PROT 6.0* 5.9* 6.4*  ALBUMIN 3.2* 3.1* 3.4*      CBG: Recent Labs  Lab 02/16/22 0757 02/16/22 1212 02/16/22 1633 02/16/22 2104 02/17/22 0756  GLUCAP 179* 204* 137* 153* 160*     Radiology Studies: No results found.  Scheduled Meds:  amiodarone  200 mg Oral BID   apixaban  5 mg Oral BID   insulin aspart  0-5 Units Subcutaneous QHS   insulin aspart  0-9 Units Subcutaneous TID WC   metoprolol tartrate  12.5 mg Oral BID   pantoprazole  40 mg Oral Q1200   polyethylene glycol  17 g Oral BID   Continuous Infusions:  sodium chloride       LOS: 14 days     Bonnielee Haff, MD [facility]   02/17/2022, 11:11 AM

## 2022-02-17 NOTE — Progress Notes (Signed)
PT Cancellation Note  Patient Details Name: [name] MRN: [id number] DOB: 1932/11/30   Cancelled Treatment:    Reason Eval/Treat Not Completed: (P) Patient declined, no reason specified Pt reports she just got back to bed and does not want to get back up. Pt states "I'm happy my heart is behaving now." PT will follow back tomorrow.  Manley Fason B. Migdalia Dk PT, DPT Acute Rehabilitation Services Please use secure chat or  Call Office [phone]    [place] 02/17/2022, 3:36 PM

## 2022-02-18 ENCOUNTER — Ambulatory Visit: Payer: Medicare PPO | Admitting: Nurse Practitioner

## 2022-02-18 DIAGNOSIS — F411 Generalized anxiety disorder: Secondary | ICD-10-CM | POA: Diagnosis not present

## 2022-02-18 DIAGNOSIS — R1311 Dysphagia, oral phase: Secondary | ICD-10-CM | POA: Diagnosis not present

## 2022-02-18 DIAGNOSIS — I10 Essential (primary) hypertension: Secondary | ICD-10-CM | POA: Diagnosis not present

## 2022-02-18 DIAGNOSIS — E119 Type 2 diabetes mellitus without complications: Secondary | ICD-10-CM | POA: Diagnosis not present

## 2022-02-18 DIAGNOSIS — I4891 Unspecified atrial fibrillation: Secondary | ICD-10-CM | POA: Diagnosis not present

## 2022-02-18 DIAGNOSIS — I480 Paroxysmal atrial fibrillation: Secondary | ICD-10-CM | POA: Diagnosis not present

## 2022-02-18 DIAGNOSIS — Q231 Congenital insufficiency of aortic valve: Secondary | ICD-10-CM | POA: Diagnosis not present

## 2022-02-18 DIAGNOSIS — R2689 Other abnormalities of gait and mobility: Secondary | ICD-10-CM | POA: Diagnosis not present

## 2022-02-18 DIAGNOSIS — M6259 Muscle wasting and atrophy, not elsewhere classified, multiple sites: Secondary | ICD-10-CM | POA: Diagnosis not present

## 2022-02-18 DIAGNOSIS — M6281 Muscle weakness (generalized): Secondary | ICD-10-CM | POA: Diagnosis not present

## 2022-02-18 DIAGNOSIS — R1084 Generalized abdominal pain: Secondary | ICD-10-CM | POA: Diagnosis not present

## 2022-02-18 DIAGNOSIS — R112 Nausea with vomiting, unspecified: Secondary | ICD-10-CM | POA: Diagnosis not present

## 2022-02-18 DIAGNOSIS — I499 Cardiac arrhythmia, unspecified: Secondary | ICD-10-CM | POA: Diagnosis not present

## 2022-02-18 DIAGNOSIS — Z7401 Bed confinement status: Secondary | ICD-10-CM | POA: Diagnosis not present

## 2022-02-18 DIAGNOSIS — R279 Unspecified lack of coordination: Secondary | ICD-10-CM | POA: Diagnosis not present

## 2022-02-18 DIAGNOSIS — K219 Gastro-esophageal reflux disease without esophagitis: Secondary | ICD-10-CM | POA: Diagnosis not present

## 2022-02-18 LAB — BASIC METABOLIC PANEL
Anion gap: 7 (ref 5–15)
BUN: 47 mg/dL — ABNORMAL HIGH (ref 8–23)
CO2: 25 mmol/L (ref 22–32)
Calcium: 9.1 mg/dL (ref 8.9–10.3)
Chloride: 102 mmol/L (ref 98–111)
Creatinine, Ser: 1.15 mg/dL — ABNORMAL HIGH (ref 0.44–1.00)
GFR, Estimated: 46 mL/min — ABNORMAL LOW (ref >60)
Glucose, Bld: 148 mg/dL — ABNORMAL HIGH (ref 70–99)
Potassium: 4.4 mmol/L (ref 3.5–5.1)
Sodium: 134 mmol/L — ABNORMAL LOW (ref 135–145)

## 2022-02-18 LAB — GLUCOSE, CAPILLARY
Glucose-Capillary: 165 mg/dL — ABNORMAL HIGH (ref 70–99)
Glucose-Capillary: 268 mg/dL — ABNORMAL HIGH (ref 70–99)
Glucose-Capillary: 166 mg/dL — ABNORMAL HIGH (ref 70–99)

## 2022-02-18 MED ORDER — AMIODARONE HCL 200 MG PO TABS: ORAL_TABLET | ORAL | Status: DC

## 2022-02-18 MED ORDER — ALPRAZOLAM 0.5 MG PO TABS: 0.5000 mg | ORAL_TABLET | Freq: Two times a day (BID) | ORAL | 0 refills | Status: DC | PRN

## 2022-02-18 MED ORDER — METOPROLOL TARTRATE 25 MG PO TABS: 12.5000 mg | ORAL_TABLET | Freq: Two times a day (BID) | ORAL | Status: DC

## 2022-02-18 MED ORDER — ONDANSETRON HCL 4 MG PO TABS: 4.0000 mg | ORAL_TABLET | Freq: Three times a day (TID) | ORAL | 0 refills | Status: DC | PRN

## 2022-02-18 MED ORDER — AMIODARONE HCL 200 MG PO TABS: 200.0000 mg | ORAL_TABLET | Freq: Every day | ORAL | Status: DC

## 2022-02-18 MED ORDER — INSULIN GLARGINE 100 UNIT/ML SOLOSTAR PEN: 5.0000 [IU] | PEN_INJECTOR | Freq: Every morning | SUBCUTANEOUS | 11 refills | Status: DC

## 2022-02-18 MED ORDER — INSULIN GLARGINE 100 UNIT/ML SOLOSTAR PEN: 8.0000 [IU] | PEN_INJECTOR | Freq: Every morning | SUBCUTANEOUS | 11 refills | Status: DC

## 2022-02-18 MED ORDER — POLYETHYLENE GLYCOL 3350 17 G PO PACK: 17.0000 g | PACK | Freq: Two times a day (BID) | ORAL | 0 refills | Status: DC

## 2022-02-18 NOTE — Progress Notes (Signed)
Report called to receiving RN at [facility].

## 2022-02-18 NOTE — Progress Notes (Deleted)
Cardiology Office Note:    Date:  02/18/2022   ID:  [name], DOB 08-Nov-1932, MRN [id number]  PCP:  Antony Contras, MD   [facility] Providers Cardiologist:  Fransico Him, MD { Click to update primary MD,subspecialty MD or APP then REFRESH:1}    Referring MD: Antony Contras, MD   Chief Complaint: ***  History of Present Illness:    [name] is a *** 89 y.o. female with a hx of   Thoracic aortic aneurysm CT 08/2020: 44 mm CT 05/2021: 44 mm (HFpEF) heart failure with preserved ejection fraction  Admit 05/2021 Hypertension Hyperlipidemia Mitral valve prolapse Mild mitral regurgitation Echo 07/2021: Trivial MR Diabetes mellitus Carotid artery disease Peptic ulcer disease with hiatal hernia Cerebral aneurysm s/p coiling Hx of ?TIA Monitor x4 days (reaction to pads)-no arrhythmias during that time Mild pulmonary hypertension Echo 2019: PASP 40 Hx of chronic chest pain and shortness of breath Cardiac catheterization 2002: Normal coronary arteries Myoview 2017 low risk Colon CA Increased social stress Primary caregiver for her husband who has dementia Aortic atherosclerosis  Last cardiology clinic visit was 07/13/21 with Richardson Dopp, PA following admission 5/31-07/02/21 with acute CHF.  She presented with shortness of breath, leg swelling, markedly elevated BP. Mildly elevated BNP, mildly elevated hs Trops without significant delta, normal LV function, mild LVH on echo.  She reported stable weight since discharge.  Chest pain for over a year that comes and goes, had not changed recently, chronic shortness of breath that had improved since admission and remained stable.  Unable to tolerate BP < 140, she was not felt to be a candidate for [employer].  Following lab work that day, she was advised to decrease furosemide from 20 mg daily to 3 days a week and take extra 20 mg if weight increases 3+ pounds in 1 day. Advised to return for follow-up in 2 months.  Seen in  consult by Dr. Lovena Le during previous admission 12/2-12/4 Troponins elevated to 3000   Admission 1/4 for intractable nausea and vomiting who had atrial fibrillation with RVR  Past Medical History:  Diagnosis Date   Abdominal pain, chronic, left lower quadrant    Acute torn meniscus of knee    Adenomatous colon polyp 1970   carcinoma in situ   Allergic rhinitis    Amaurosis fugax 08/07/2012   Anxiety    Ascending aortic aneurysm ([place]) 12/07/2015   30m by chest CTA 08/2020   Benign essential tremor syndrome    Bicuspid aortic valve    no AS on 07/2019   Carotid stenosis    1039 bilateral by dopplers 03/2017.    colon ca dx'd 1970   surg only   DDD (degenerative disc disease)    Diverticulitis    Diverticulosis    [place]    Duodenitis    peptic, with gastric heterotopia   Endometriosis    s/p hysterectomy   Fundic gland polyposis of stomach    GERD (gastroesophageal reflux disease)    Heart murmur    Hiatal hernia 02/03/2005   History of cerebral aneurysm repair    s/p coiling   History of hemorrhoids    with bleeding   History of shingles    HLD (hyperlipidemia)    HTN (hypertension)    Hypercholesteremia    IBS (irritable bowel syndrome)    Iron deficiency    Lung nodule 07/12/2021   CT 05/2021: 3 mm right solid pulmonary nodule-no routine follow-up imaging recommended  Migraine    MVP (mitral valve prolapse)    Osteopenia    Peripheral neuropathy    Toe fracture, right    second toe   UTI (lower urinary tract infection)    Varicose vein     Past Surgical History:  Procedure Laterality Date   ABDOMINAL HYSTERECTOMY     APPENDECTOMY     CARPAL TUNNEL RELEASE  08/26/07   CATARACT EXTRACTION     CEREBRAL ANEURYSM REPAIR     COLON RESECTION     COLONOSCOPY  06/07/08   divertiulosis, internal hemorrhoids   COLONOSCOPY W/ BIOPSIES AND POLYPECTOMY  02/03/05   diverticulosis, 4 mm sessile polyps, internal and external hemorrhoids   CORONARY  ANGIOPLASTY WITH STENT PLACEMENT     ESOPHAGOGASTRODUODENOSCOPY  02/03/05   hiatal hernia, 6 benign gastric polyps   FLEXIBLE SIGMOIDOSCOPY     HAND SURGERY Right    INTRAOCULAR LENS INSERTION     right hand decompressive fasciotomy  08/26/07   , dorsal and volar   TONSILLECTOMY      Current Medications: No outpatient medications have been marked as taking for the 02/18/22 encounter (Appointment) with Ann Maki Lanice Schwab, NP.     Allergies:   Actos [pioglitazone], Amoxil [amoxicillin], Avandia [rosiglitazone], Fosamax [alendronate sodium], Ms contin [morphine], Ultram [tramadol], Amaryl [glimepiride], Januvia [sitagliptin], Lipitor [atorvastatin], Metformin and related, Prandin [repaglinide], Tradjenta [linagliptin], Zoloft [sertraline hcl], Asa [aspirin], Bactrim [sulfamethoxazole-trimethoprim], Bentyl [dicyclomine hcl], Cipro [ciprofloxacin hcl], Hctz [hydrochlorothiazide], Keflex [cephalexin], Morphine and related, Norvasc [amlodipine besylate], Oxycontin [oxycodone], Sulfa antibiotics, and Penicillins   Social History   Socioeconomic History   Marital status: Married    Spouse name: Charles   Number of children: 0   Years of education: Not on file   Highest education level: Not on file  Occupational History   Occupation: retired    Fish farm manager: RETIRED  Tobacco Use   Smoking status: Never   Smokeless tobacco: Never  Vaping Use   Vaping Use: Never used  Substance and Sexual Activity   Alcohol use: No   Drug use: No   Sexual activity: Not on file  Other Topics Concern   Not on file  Social History Narrative   Lives with husband    Right handed   Caffeine: 2 c of coffee a day   Social Determinants of Health   Financial Resource Strain: Not on file  Food Insecurity: No Food Insecurity (02/05/2022)   Hunger Vital Sign    Worried About Running Out of Food in the Last Year: Never true    Ran Out of Food in the Last Year: Never true  Transportation Needs: No Transportation Needs  (02/05/2022)   PRAPARE - Hydrologist (Medical): No    Lack of Transportation (Non-Medical): No  Physical Activity: Not on file  Stress: Not on file  Social Connections: Not on file     Family History: The patient's ***family history includes Colon cancer in an other family member; Diabetes in her brother, brother, paternal grandmother, and another family member; Heart disease in her brother, brother, and father; Hypertension in her brother; Kidney disease in her father; Microcephaly in her brother; Ovarian cancer in her mother.  ROS:   Please see the history of present illness.    *** All other systems reviewed and are negative.  Labs/Other Studies Reviewed:    The following studies were reviewed today:  ECHO COMPLETE WO IMAGING ENHANCING AGENT 07/01/2021 EF 60-65, no RWMA, mild LVH, normal RVSF,  trivial MR, trivial AI, aortic root 37 mm, ascending aorta 41 mm    Chest/abdomen/pelvis angio 06/30/2021 IMPRESSION: 1. Unchanged 4.4 cm ascending aortic aneurysm. Recommend annual imaging followup by CTA or MRA. This recommendation follows 2010 ACCF/AHA/AATS/ACR/ASA/SCA/SCAI/SIR/STS/SVM Guidelines for the Diagnosis and Management of Patients with Thoracic Aortic Disease. Circulation. 2010; [id number]. Aortic aneurysm NOS (ICD10-I71.9) 2. No evidence for aortic dissection. 3. No acute localizing process in the chest, abdomen or pelvis. 4. Stable mild cardiomegaly. 5. 3 mm right solid pulmonary nodule. No routine follow-up imaging is recommended per Fleischner Society Guidelines. These guidelines do not apply to immunocompromised patients and patients with cancer. Follow up in patients with significant comorbidities as clinically warranted. For lung cancer screening, adhere to Lung-RADS guidelines. Reference: Radiology. 2017; 284(1):228-43. 6. Colonic diverticulosis with large amount of stool in the rectum. 7.  Aortic Atherosclerosis (ICD10-I70.0).    Carotid US 01/16/2018 Bilateral ICA 1-39   Event monitor 08/24/2017 Sinus bradycardia, Normal sinus rhythm and sinust tachycardia with average herat rate 57bpm. The HR ranged from 46 to 120bpm. Occasional PVCs with PVC load < 1%   Myoview 05/18/2015 EF 64; no ischemia or infarction; low risk   Cardiac catheterization 08/17/2000 EF 62 Normal coronary arteries   Recent Labs: 01/01/2022: TSH 0.899 02/03/2022: B Natriuretic Peptide 970.6 02/16/2022: ALT 25; Hemoglobin 13.9; Magnesium 1.9; Platelets 200 02/18/2022: BUN 47; Creatinine, Ser 1.15; Potassium 4.4; Sodium 134  Recent Lipid Panel    Component Value Date/Time   CHOL 144 01/02/2022 1056   TRIG 101 01/02/2022 1056   HDL 32 (L) 01/02/2022 1056   CHOLHDL 4.5 01/02/2022 1056   VLDL 20 01/02/2022 1056   LDLCALC 92 01/02/2022 1056     Risk Assessment/Calculations:   {Does this patient have ATRIAL FIBRILLATION?:[id number]}       Physical Exam:    VS:  There were no vitals taken for this visit.    Wt Readings from Last 3 Encounters:  02/18/22 140 lb 3.2 oz (63.6 kg)  01/03/22 159 lb 2.8 oz (72.2 kg)  07/13/21 147 lb 9.6 oz (67 kg)     GEN: *** Well nourished, well developed in no acute distress HEENT: Normal NECK: No JVD; No carotid bruits CARDIAC: ***RRR, no murmurs, rubs, gallops RESPIRATORY:  Clear to auscultation without rales, wheezing or rhonchi  ABDOMEN: Soft, non-tender, non-distended MUSCULOSKELETAL:  No edema; No deformity. *** pedal pulses, ***bilaterally SKIN: Warm and dry NEUROLOGIC:  Alert and oriented x 3 PSYCHIATRIC:  Normal affect   EKG:  EKG is *** ordered today.  The ekg ordered today demonstrates ***       Diagnoses:    No diagnosis found. Assessment and Plan:       {Are you ordering a CV Procedure (e.g. stress test, cath, DCCV, TEE, etc)?   Press F2        :[id number]   Disposition:  Medication Adjustments/Labs and Tests Ordered: Current medicines are reviewed at length with the  patient today.  Concerns regarding medicines are outlined above.  No orders of the defined types were placed in this encounter.  No orders of the defined types were placed in this encounter.   There are no Patient Instructions on file for this visit.   Signed, Emmaline Life, NP  02/18/2022 5:35 AM    [place]

## 2022-02-18 NOTE — Care Management Important Message (Signed)
Important Message  Patient Details  Name: [name] MRN: [id number] Date of Birth: 1932-06-16   Medicare Important Message Given:  Yes     Shelda Altes 02/18/2022, 12:13 PM

## 2022-02-18 NOTE — Progress Notes (Signed)
   Reviewed telemetry. Patient is maintaining sinus rhythm with heart rates in the 50s to 60s. OK for discharge from a cardiac standpoint. Discussed medications with Dr. Irish Lack - recommend he be discharged on Amiodarone '200mg'$  twice daily for one week and then decrease to '200mg'$  once daily, Lopressor 12.'5mg'$  twice daily, and Eliquis '5mg'$  twice daily. Will arrange hospital follow-up visit.  Darreld Mclean, PA-C 02/18/2022 10:34 AM

## 2022-02-18 NOTE — Discharge Summary (Signed)
[facility]  Physician Discharge Summary   Patient ID: [name] MRN: [id number] DOB/AGE: [birth year] 89 y.o.  Admit date: 02/03/2022 Discharge date:   02/18/2022   PCP: Antony Contras, MD  DISCHARGE DIAGNOSES:     Elevated troponin   Diarrhea   DM (diabetes mellitus) ([place])   Chest pain   Acute hypoxemic respiratory failure ([place])   Hypertensive urgency   Paroxysmal atrial fibrillation (HCC)   Acute on chronic heart failure with preserved ejection fraction (HCC)   Atrial fibrillation ([place])   RECOMMENDATIONS FOR OUTPATIENT FOLLOW UP: Please check CBC and basic metabolic panel in 4 to 5 days Monitor CBGs   Home Health: Going to SNF Equipment/Devices: None  CODE STATUS: DNR  DISCHARGE CONDITION: fair  Diet recommendation: Heart healthy  INITIAL HISTORY: 89/F with history of DM2, hypertension, AAA, PVD, TIA, cerebral aneurysm status post coiling, GERD, duodenitis, IBS, anxiety.  Admitted last month in early December for new onset A-fib with RVR and was started on Coreg and Eliquis. She presented to the ED 1/4 with nausea vomiting and diarrhea. She was treated for suspected viral gastroenteritis.  Hospitalization complicated by volume overload and afib with RVR.     Consultations: Cardiology   HOSPITAL COURSE:   Atrial fibrillation with RVR -Recent echo with preserved EF, required Cardizem drip on admission -Recurrent A-fib RVR this admission Cardiology was consulted.  Metoprolol was initiated but due to bradycardia and orthostatic hypotension dose had to be reduced. Due to persistent RVR patient was started on amiodarone infusion and transition to oral amiodarone. Has been in sinus rhythm for 36 hours. Continue apixaban.  Cardiology will arrange outpatient follow-up.   Orthostatic hypotension Improved after metoprolol dose was adjusted. Will hold her other antihypertensives with close outpatient follow-up to determine resumption.   Acute kidney  injury Rise in BUN and creatinine noted.  Not thought to be volume overloaded by cardiology.  She could be somewhat volume depleted.  She was given furosemide during earlier part of this admission.  She was gently treated with improvement in renal function.  Will hold her ACE inhibitor for now due to this reason as well as due to orthostatic hypotension.  Will also hold her spironolactone.   Nausea/vomiting/diarrhea -Suspect viral gastroenteritis, flu and COVID PCR negative, CT abdomen pelvis without acute findings -Continue supportive care, no further diarrhea or vomiting Persistent nausea could be due to amiodarone.  Continue Zofran as needed.   Acute on chronic diastolic CHF Mild hypoxia -Likely triggered by A-fib, has been taking Lasix 3 times a week, chest x-ray with pulmonary vascular congestion and mild edema -Treated with 2 doses of IV Lasix.  Lasix can be taken as needed as she was doing previously. -Last echo 12/23 with EF 55-60%, grade 1 DD -Incentive spirometry, weaned off O2    Elevated troponin -Likely secondary to demand in the setting of CHF, A-fib   Hypertensive urgency -Systolic as high as 628, now improved  -Continue metoprolol (dose reduced), diuretics as above   Hyperkalemia Resolved   Insulin-dependent diabetes -A1c 6.2 on 01/02/2022 Resume her long-acting insulin but at a lower dose.  Patient is stable.  Okay for discharge to SNF today.   PERTINENT LABS:  The results of significant diagnostics from this hospitalization (including imaging, microbiology, ancillary and laboratory) are listed below for reference.     Labs:   Basic Metabolic Panel: Recent Labs  Lab 02/13/22 0200 02/14/22 0148 02/16/22 0244 02/17/22 0200 02/18/22 0326  NA 138 137 137 134* 134*  K 4.4 4.6 5.3* 4.5 4.4  CL 104 103 105 101 102  CO2 26 27 20* 25 25  GLUCOSE 131* 132* 167* 160* 148*  BUN 34* 31* 49* 53* 47*  CREATININE 0.93 1.04* 1.13* 1.35* 1.15*  CALCIUM 9.1 8.9 9.4  9.2 9.1  MG 1.7 1.7 1.9  --   --   PHOS 3.5 4.1 4.8*  --   --    Liver Function Tests: Recent Labs  Lab 02/13/22 0200 02/14/22 0148 02/16/22 0244  AST '19 18 25  '$ ALT '22 21 25  '$ ALKPHOS 48 46 60  BILITOT 0.5 0.4 1.0  PROT 6.0* 5.9* 6.4*  ALBUMIN 3.2* 3.1* 3.4*    CBC: Recent Labs  Lab 02/12/22 0926 02/13/22 0200 02/14/22 0148 02/16/22 0244  WBC 8.8 10.7* 9.5 12.3*  NEUTROABS  --  3.6 3.2 5.7  HGB 12.0 12.3 11.8* 13.9  HCT 38.1 38.1 36.6 44.6  MCV 77.9* 76.5* 76.7* 77.4*  PLT 246 252 229 200    BNP: BNP (last 3 results) Recent Labs    06/30/21 1830 01/01/22 2220 02/03/22 2253  BNP 139.4* 234.2* 970.6*     CBG: Recent Labs  Lab 02/17/22 1204 02/17/22 1617 02/17/22 2124 02/18/22 0700 02/18/22 0815  GLUCAP 214* 129* 224* 165* 268*     IMAGING STUDIES DG Chest Portable 1 View  Result Date: 02/03/2022 CLINICAL DATA:  Shortness of breath EXAM: PORTABLE CHEST 1 VIEW COMPARISON:  01/01/2022 FINDINGS: Cardiomegaly. Central vascular congestion. Possible mild interstitial edema. No pleural effusion or focal airspace disease. Aortic atherosclerosis. IMPRESSION: Cardiomegaly with central vascular congestion and possible mild interstitial edema. Electronically Signed   By: Donavan Foil M.D.   On: 02/03/2022 21:02   CT ABDOMEN PELVIS W CONTRAST  Result Date: 02/03/2022 CLINICAL DATA:  Left lower quadrant abdominal pain. EXAM: CT ABDOMEN AND PELVIS WITH CONTRAST TECHNIQUE: Multidetector CT imaging of the abdomen and pelvis was performed using the standard protocol following bolus administration of intravenous contrast. RADIATION DOSE REDUCTION: This exam was performed according to the departmental dose-optimization program which includes automated exposure control, adjustment of the mA and/or kV according to patient size and/or use of iterative reconstruction technique. CONTRAST:  36m OMNIPAQUE IOHEXOL 350 MG/ML SOLN COMPARISON:  Multiple prior CT examinations including  01/02/2022 and older FINDINGS: Lower chest: Mild linear opacity lung bases likely scar or atelectasis. No pleural effusion. The heart is enlarged. Hepatobiliary: Diffuse fatty liver infiltration. No enhancing liver lesion. Patent portal vein. The gallbladder is nondilated. Pancreas: Severe fatty atrophy of the pancreas. Spleen: Spleen is nonenlarged. Adrenals/Urinary Tract: Slight thickening of both adrenal glands is noted, nonspecific and not significantly changed from the recent [facility]. No enhancing renal mass. No collecting system dilatation. Preserved contour to the urinary bladder. Stomach/Bowel: On this non oral contrast exam, large bowel is of normal course and caliber. Left-sided colonic diverticula. Scattered stool. Stomach is nondilated. Small bowel is nondilated. No free air or free fluid. Vascular/Lymphatic: Normal caliber aorta and IVC. Minimal atherosclerotic calcifications along the aorta and branch vessels. No developing abnormal lymph node enlargement present in the abdomen and pelvis. Reproductive: Prostate is unremarkable. Other: No abdominal wall hernia or abnormality. No abdominopelvic ascites. Musculoskeletal: Scattered degenerative changes of the spine. There is trace anterolisthesis of L5 on S1. Multilevel disc bulging in the lumbar spine region with some canal narrowing. IMPRESSION: No bowel obstruction, free air or free fluid. Few colonic diverticula. Fatty liver infiltration. Electronically Signed   By: ALarose HiresD.  On: 02/03/2022 17:46    DISCHARGE EXAMINATION: Vitals:   02/17/22 1956 02/17/22 2318 02/18/22 0431 02/18/22 0810  BP: (!) 155/47 (!) 129/50 (!) 145/61 (!) 154/51  Pulse: 61 (!) 53 (!) 57 (!) 57  Resp: 19 (!) '21 19 18  '$ Temp: 97.6 F (36.4 C) 97.6 F (36.4 C) 98.1 F (36.7 C) 97.7 F (36.5 C)  TempSrc: Oral Oral Oral Oral  SpO2: 97% 94% 90% 92%  Weight:   63.6 kg   Height:       General appearance: Awake alert.  In no  distress Resp: Clear to auscultation bilaterally.  Normal effort Cardio: S1-S2 is normal regular.  No S3-S4.  No rubs murmurs or bruit GI: Abdomen is soft.  Nontender nondistended.  Bowel sounds are present normal.  No masses organomegaly Extremities: No edema.  Full range of motion of lower extremities.    DISPOSITION: SNF  Discharge Instructions     Call MD for:  difficulty breathing, headache or visual disturbances   Complete by: As directed    Call MD for:  extreme fatigue   Complete by: As directed    Call MD for:  persistant dizziness or light-headedness   Complete by: As directed    Call MD for:  persistant nausea and vomiting   Complete by: As directed    Call MD for:  severe uncontrolled pain   Complete by: As directed    Call MD for:  temperature >100.4   Complete by: As directed    Diet - low sodium heart healthy   Complete by: As directed    Discharge instructions   Complete by: As directed    Please review instructions on the discharge summary.  You were cared for by a hospitalist during your hospital stay. If you have any questions about your discharge medications or the care you received while you were in the hospital after you are discharged, you can call the unit and asked to speak with the hospitalist on call if the hospitalist that took care of you is not available. Once you are discharged, your primary care physician will handle any further medical issues. Please note that NO REFILLS for any discharge medications will be authorized once you are discharged, as it is imperative that you return to your primary care physician (or establish a relationship with a primary care physician if you do not have one) for your aftercare needs so that they can reassess your need for medications and monitor your lab values. If you do not have a primary care physician, you can call [phone] for a physician referral.   Increase activity slowly   Complete by: As directed           Allergies as of 02/18/2022       Reactions   Actos [pioglitazone] Other (See Comments)   Chronic pedal edema   Amoxil [amoxicillin] Other (See Comments)   Stomach upset   Avandia [rosiglitazone] Other (See Comments)   Chronic pedal edema   Fosamax [alendronate Sodium] Other (See Comments)   Unknown reaction   Ms Contin [morphine] Other (See Comments)   Body spasms    Ultram [tramadol] Shortness Of Breath, Palpitations   Amaryl [glimepiride] Other (See Comments)   Unknown reaction   Januvia [sitagliptin] Other (See Comments)   Unknown reaction   Lipitor [atorvastatin] Swelling   Myalgias    Metformin And Related Other (See Comments)   Gastritis    Prandin [repaglinide] Other (See Comments)   Abdominal pain  Tradjenta [linagliptin] Other (See Comments)   GI issues   Zoloft [sertraline Hcl] Other (See Comments)   Altered mental state   Asa [aspirin] Other (See Comments)   Bleeding ulcers    Bactrim [sulfamethoxazole-trimethoprim] Hives   Bentyl [dicyclomine Hcl] Other (See Comments)   Near syncope Weakness    Cipro [ciprofloxacin Hcl] Nausea And Vomiting, Other (See Comments)   Stomach upset   Hctz [hydrochlorothiazide] Other (See Comments)   Urinary issues   Keflex [cephalexin] Other (See Comments)   Unknown reaction   Morphine And Related Other (See Comments)   Body spasms   Norvasc [amlodipine Besylate] Other (See Comments)   Weakness Dizziness    Oxycontin [oxycodone] Other (See Comments)   Unknown reaction   Sulfa Antibiotics Hives   Penicillins Swelling, Rash        Medication List     STOP taking these medications    carvedilol 6.25 MG tablet Commonly known as: COREG   ezetimibe 10 MG tablet Commonly known as: ZETIA   hydrALAZINE 10 MG tablet Commonly known as: APRESOLINE   spironolactone 25 MG tablet Commonly known as: ALDACTONE   trandolapril 4 MG tablet Commonly known as: MAVIK       TAKE these medications    acetaminophen  500 MG tablet Commonly known as: TYLENOL Take 500 mg by mouth every 6 (six) hours as needed for mild pain, moderate pain, fever or headache.   ALPRAZolam 0.5 MG tablet Commonly known as: XANAX Take 1-2 tablets (0.5-1 mg total) by mouth 2 (two) times daily as needed for anxiety.   amiodarone 200 MG tablet Commonly known as: PACERONE Take '200mg'$  twice daily until 02/23/22 and then take '200mg'$  once daily   apixaban 5 MG Tabs tablet Commonly known as: ELIQUIS Take 1 tablet (5 mg total) by mouth 2 (two) times daily.   furosemide 20 MG tablet Commonly known as: LASIX TAKE ONE TAB EVERY MON, WED, FRI. TAKE ONE ADDITIONAL TAB IF WEIGHT GAIN OF 3+ LBS OVERNIGHT What changed: See the new instructions.   hydrocortisone 2.5 % rectal cream Commonly known as: ANUSOL-HC Apply to perianal area 3 times daily. What changed:  how much to take how to take this when to take this reasons to take this   insulin glargine 100 UNIT/ML Solostar Pen Commonly known as: LANTUS Inject 5 Units into the skin in the morning. What changed: how much to take   metoprolol tartrate 25 MG tablet Commonly known as: LOPRESSOR Take 0.5 tablets (12.5 mg total) by mouth 2 (two) times daily.   multivitamin tablet Take 1 tablet by mouth in the morning.   ondansetron 4 MG tablet Commonly known as: ZOFRAN Take 1 tablet (4 mg total) by mouth every 8 (eight) hours as needed for nausea or vomiting.   pantoprazole 40 MG tablet Commonly known as: PROTONIX TAKE 1 TABLET BY MOUTH EVERY DAY BEFORE BREAKFAST What changed: See the new instructions.   polyethylene glycol 17 g packet Commonly known as: MIRALAX / GLYCOLAX Take 17 g by mouth 2 (two) times daily.          Contact information for follow-up providers     Elgie Collard, PA-C Follow up.   Specialty: Cardiology Why: Hospital follow-up with Cardiology scheduled for 03/10/2022 at 10:30am. Please arrive 15 minutes early for check-in. If this date/time does not  work for you, please call our office to reschedule. Contact information: [address] [place] [address] [phone]  Contact information for after-discharge care     Destination     [facility] .   Service: Skilled Nursing Contact information: [address] [place] [phone]                     TOTAL DISCHARGE TIME: 45 minutes  [facility] Pager on [email or web address]  02/18/2022, 10:54 AM

## 2022-02-18 NOTE — TOC Progression Note (Signed)
Transition of Care [facility]) - Progression Note    Patient Details  Name: [name] MRN: [id number] Date of Birth: May 31, 1932  [facility] CM/SW [place], [place] Phone Number: 02/18/2022, 1:02 PM  Clinical Narrative:     CSW informed by MD patient medically ready for dc. CSW spoke with [place] with [facility] who confirmed she can accept patient today. CSW informed MD. Patient and patients daughter confirmed patient will transport by [facility]. CSW will continue to follow and assist with patients dc planning needs.   Expected Discharge Plan: [place] Barriers to Discharge: No Barriers Identified  Expected Discharge Plan and Services In-house Referral: Clinical Social Work     Living arrangements for the past 2 months: Single Family Home Expected Discharge Date: 02/18/22                                     Social Determinants of Health (SDOH) Interventions SDOH Screenings   Food Insecurity: No Food Insecurity (02/05/2022)  Housing: Low Risk  (02/05/2022)  Transportation Needs: No Transportation Needs (02/05/2022)  Utilities: Not At Risk (02/05/2022)  Tobacco Use: Low Risk  (02/03/2022)    Readmission Risk Interventions     No data to display

## 2022-02-18 NOTE — Progress Notes (Signed)
Mobility Specialist - Progress Note   02/18/22 1352  Mobility  Activity Ambulated with assistance in room  Level of Assistance Minimal assist, patient does 75% or more  Assistive Device Front wheel walker  Distance Ambulated (ft) 10 ft  Activity Response Tolerated well  Mobility Referral Yes  $Mobility charge 1 Mobility   Pt was received in bed and agreeable to mobility. Pt c/o slightly dizziness upon EOB. Pt was able to stand and ambulate with minimal assistance. Pt was returned to bed with all needs met.   Franki [place]  Mobility Specialist Please contact via Solicitor or Rehab office at [phone]

## 2022-02-18 NOTE — TOC Transition Note (Signed)
Transition of Care [facility]) - CM/SW Discharge Note   Patient Details  Name: [name] MRN: [id number] Date of Birth: [birth year]  Transition of Care [facility]) CM/SW Contact:  Milas Gain, [place] Phone Number: 02/18/2022, 1:05 PM   Clinical Narrative:     Patient will DC to: [facility]  Anticipated DC date: 02/18/2022  Family notified: Data processing manager by: Corey Harold   ?  Per MD patient ready for DC to [facility] . RN, patient, patient's family, and facility notified of DC. Discharge Summary sent to facility. RN given number for report tele# [phone] RM# [phone]. DC packet on chart. DNR signed by MD attached to patients DC packet.Ambulance transport requested for patient.  CSW signing off.    Final next level of care: Skilled Nursing Facility Barriers to Discharge: No Barriers Identified   Patient Goals and CMS Choice CMS Medicare.gov Compare Post Acute Care list provided to:: Patient Choice offered to / list presented to : Patient  Discharge Placement                Patient chooses bed at: [facility] Patient to be transferred to facility by: [place] Name of family member notified: Debbie Patient and family notified of of transfer: 02/18/22  Discharge Plan and Services Additional resources added to the After Visit Summary for   In-house Referral: Clinical Social Work                                   Social Determinants of Health ([place]) Interventions SDOH Screenings   Food Insecurity: No Food Insecurity (02/05/2022)  Housing: Low Risk  (02/05/2022)  Transportation Needs: No Transportation Needs (02/05/2022)  Utilities: Not At Risk (02/05/2022)  Tobacco Use: Low Risk  (02/03/2022)     Readmission Risk Interventions     No data to display

## 2022-02-18 NOTE — Progress Notes (Signed)
Physical Therapy Treatment Patient Details Name: [name] MRN: [id number] DOB: 1932/12/09 Today's Date: 02/18/2022   History of Present Illness Pt is an 89 y.o. female admitted 02/03/21 with nausea, vomiting, diarrhea; suspect viral gastroenteritis. Pt also with HTN, afib with RVR. Recent admission 12/2021 for new onset afib with RVR. PMH includes DM2, HTN, AAA, PVD, TIA, cerebral aneurysm s/p coiling, IBS, duodenitis, anxiety.    PT Comments    Pt reports feeling nauseous but is willing to try to get up to walk. Pt looking forward to going to SNF this afternoon. Pt is min A for bed mobility, min guard for transfers and min A for ambulation in room. HR and BP stable throughout. PT will benefit from continued PT at SNF to work on strength, balance and endurance.     Recommendations for follow up therapy are one component of a multi-disciplinary discharge planning process, led by the attending physician.  Recommendations may be updated based on patient status, additional functional criteria and insurance authorization.  Follow Up Recommendations  Skilled nursing-short term rehab (<3 hours/day) Can patient physically be transported by private vehicle: Yes   Assistance Recommended at Discharge Frequent or constant Supervision/Assistance (assist for all out of bed)  Patient can return home with the following A little help with walking and/or transfers;A little help with bathing/dressing/bathroom;Assistance with cooking/housework;Assist for transportation;Help with stairs or ramp for entrance   Equipment Recommendations  BSC/3in1 (report she has cane and RW)       Precautions / Restrictions Precautions Precautions: Fall Precaution Comments: monitor BP Restrictions Weight Bearing Restrictions: No     Mobility  Bed Mobility Overal bed mobility: Needs Assistance Bed Mobility: Supine to Sit     Supine to sit: Min assist, HOB elevated     General bed mobility comments: min A with  HoB elevated, for pt to pull against PT and use bedrail to pull to edge of bed    Transfers Overall transfer level: Needs assistance Equipment used: Rolling walker (2 wheels)   Sit to Stand: Min guard           General transfer comment: min guard and multimodal cuing for hand placement to power up, pt able to power up and self steady with using bed to brace on posterior LE    Ambulation/Gait Ambulation/Gait assistance: Min assist Gait Distance (Feet): 20 Feet Assistive device: Rolling walker (2 wheels) Gait Pattern/deviations: Step-through pattern, Decreased step length - right, Decreased step length - left, Shuffle, Trunk flexed Gait velocity: Decreased Gait velocity interpretation: <1.31 ft/sec, indicative of household ambulator   General Gait Details: contact guard min A for safety with ambulation to door and back to recliner, pt with slowed, mildly unsteady gait, no overt loss of balance          Balance Overall balance assessment: Needs assistance Sitting-balance support: No upper extremity supported, Feet supported Sitting balance-Leahy Scale: Good     Standing balance support: Bilateral upper extremity supported, Reliant on assistive device for balance Standing balance-Leahy Scale: Poor Standing balance comment: requires B UE support for dynamic balance.                            Cognition Arousal/Alertness: Awake/alert Behavior During Therapy: WFL for tasks assessed/performed Overall Cognitive Status: Within Functional Limits for tasks assessed  General Comments General comments (skin integrity, edema, etc.): HR max noted 78bpm with ambulation, BP stable      Pertinent Vitals/Pain Pain Assessment Pain Assessment: Faces Faces Pain Scale: Hurts a little bit Pain Location: stomach Pain Descriptors / Indicators: Cramping Pain Intervention(s): Limited activity within patient's  tolerance, Monitored during session, Repositioned     PT Goals (current goals can now be found in the care plan section) Acute Rehab PT Goals Patient Stated Goal: to go home PT Goal Formulation: With patient Time For Goal Achievement: 02/20/22 Potential to Achieve Goals: Good Progress towards PT goals: Progressing toward goals    Frequency    Min 2X/week      PT Plan Current plan remains appropriate;Discharge plan needs to be updated       AM-PAC PT "6 Clicks" Mobility   Outcome Measure  Help needed turning from your back to your side while in a flat bed without using bedrails?: A Little Help needed moving from lying on your back to sitting on the side of a flat bed without using bedrails?: A Little Help needed moving to and from a bed to a chair (including a wheelchair)?: A Little Help needed standing up from a chair using your arms (e.g., wheelchair or bedside chair)?: A Little Help needed to walk in hospital room?: Total Help needed climbing 3-5 steps with a railing? : A Lot 6 Click Score: 15    End of Session Equipment Utilized During Treatment: Gait belt Activity Tolerance: Patient tolerated treatment well Patient left: in bed;with call bell/phone within reach;with bed alarm set Nurse Communication: Mobility status PT Visit Diagnosis: Other abnormalities of gait and mobility (R26.89);Muscle weakness (generalized) (M62.81)     Time: 1696-7893 PT Time Calculation (min) (ACUTE ONLY): 21 min  Charges:  $Therapeutic Exercise: 8-22 mins                     Kedron Uno B. Migdalia Dk PT, DPT Acute Rehabilitation Services Please use secure chat or  Call Office [phone]    [place] 02/18/2022, 2:14 PM

## 2022-02-22 DIAGNOSIS — I10 Essential (primary) hypertension: Secondary | ICD-10-CM | POA: Diagnosis not present

## 2022-02-22 DIAGNOSIS — R112 Nausea with vomiting, unspecified: Secondary | ICD-10-CM | POA: Diagnosis not present

## 2022-02-22 DIAGNOSIS — I480 Paroxysmal atrial fibrillation: Secondary | ICD-10-CM | POA: Diagnosis not present

## 2022-02-23 DIAGNOSIS — F411 Generalized anxiety disorder: Secondary | ICD-10-CM | POA: Diagnosis not present

## 2022-02-23 DIAGNOSIS — R2689 Other abnormalities of gait and mobility: Secondary | ICD-10-CM | POA: Diagnosis not present

## 2022-02-23 DIAGNOSIS — I4891 Unspecified atrial fibrillation: Secondary | ICD-10-CM | POA: Diagnosis not present

## 2022-02-23 DIAGNOSIS — I10 Essential (primary) hypertension: Secondary | ICD-10-CM | POA: Diagnosis not present

## 2022-02-23 DIAGNOSIS — E119 Type 2 diabetes mellitus without complications: Secondary | ICD-10-CM | POA: Diagnosis not present

## 2022-02-23 DIAGNOSIS — K219 Gastro-esophageal reflux disease without esophagitis: Secondary | ICD-10-CM | POA: Diagnosis not present

## 2022-02-23 DIAGNOSIS — R112 Nausea with vomiting, unspecified: Secondary | ICD-10-CM | POA: Diagnosis not present

## 2022-02-23 DIAGNOSIS — I480 Paroxysmal atrial fibrillation: Secondary | ICD-10-CM | POA: Diagnosis not present

## 2022-02-23 DIAGNOSIS — M6281 Muscle weakness (generalized): Secondary | ICD-10-CM | POA: Diagnosis not present

## 2022-02-24 DIAGNOSIS — I10 Essential (primary) hypertension: Secondary | ICD-10-CM | POA: Diagnosis not present

## 2022-02-24 DIAGNOSIS — I4891 Unspecified atrial fibrillation: Secondary | ICD-10-CM | POA: Diagnosis not present

## 2022-02-24 DIAGNOSIS — I480 Paroxysmal atrial fibrillation: Secondary | ICD-10-CM | POA: Diagnosis not present

## 2022-02-24 DIAGNOSIS — Q231 Congenital insufficiency of aortic valve: Secondary | ICD-10-CM | POA: Diagnosis not present

## 2022-02-28 DIAGNOSIS — I4891 Unspecified atrial fibrillation: Secondary | ICD-10-CM | POA: Diagnosis not present

## 2022-02-28 DIAGNOSIS — I480 Paroxysmal atrial fibrillation: Secondary | ICD-10-CM | POA: Diagnosis not present

## 2022-03-02 DIAGNOSIS — I4891 Unspecified atrial fibrillation: Secondary | ICD-10-CM | POA: Diagnosis not present

## 2022-03-02 DIAGNOSIS — R2689 Other abnormalities of gait and mobility: Secondary | ICD-10-CM | POA: Diagnosis not present

## 2022-03-02 DIAGNOSIS — R112 Nausea with vomiting, unspecified: Secondary | ICD-10-CM | POA: Diagnosis not present

## 2022-03-02 DIAGNOSIS — M6281 Muscle weakness (generalized): Secondary | ICD-10-CM | POA: Diagnosis not present

## 2022-03-02 DIAGNOSIS — F411 Generalized anxiety disorder: Secondary | ICD-10-CM | POA: Diagnosis not present

## 2022-03-02 DIAGNOSIS — I480 Paroxysmal atrial fibrillation: Secondary | ICD-10-CM | POA: Diagnosis not present

## 2022-03-03 DIAGNOSIS — I4891 Unspecified atrial fibrillation: Secondary | ICD-10-CM | POA: Diagnosis not present

## 2022-03-03 DIAGNOSIS — I480 Paroxysmal atrial fibrillation: Secondary | ICD-10-CM | POA: Diagnosis not present

## 2022-03-09 NOTE — Progress Notes (Deleted)
Office Visit    Patient Name: [name] Date of Encounter: 03/09/2022  PCP:  Antony Contras, MD   [place]  Cardiologist:  Fransico Him, MD  Advanced Practice Provider:  No care team member to display Electrophysiologist:  None   HPI    [name] is a 89 y.o. female with a past medical history significant for thoracic aortic aneurysm (see CT 05/2021 44 mm), HFpEF, hypertension, hyperlipidemia, mitral valve prolapse, mitral valve regurgitation (07/2021 with trivial MR), diabetes mellitus, carotid artery disease, peptic ulcer disease with hiatal hernia, cerebral aneurysm status post coiling, history of TIA?,  Mild pulmonary hypertension, chronic chest pain and shortness of breath, colon cancer, increased social stress, and aortic atherosclerosis presents today for follow-up appointment.  She was seen by Dr. Radford Pax in July 2022.  Admitted 5/31 through 6/2 with acute CHF.  She presented with shortness of breath, leg swelling.  Her blood pressure was markedly elevated upon presentation (systolic 785Y).  She had improvement with IV furosemide.  Her BNP was mildly elevated at 139.  Her troponins were also mildly elevated without significant delta (10>> 21).  Chest CT demonstrated stable ascending thoracic aortic aneurysm at 4.4 cm.  There was a 3 mm right pulmonary nodule and no routine follow-up was recommended.  Echo demonstrated normal LVEF, mild LVH.  She returned for a posthospitalization follow-up 07/2021 and has been fairly stable since discharge.  Weight at home was unchanged.  She endorsed chest pain for over a year.  Comes and goes.  Has not changed.  No syncope, orthopnea.  Lower extremity edema that improved/resolves with elevation.  Chronic SOB with minimal activity.  Breathing had improved since admission and remains stable.  Today, she***  Past Medical History    Past Medical History:  Diagnosis Date   Abdominal pain, chronic, left lower quadrant     Acute torn meniscus of knee    Adenomatous colon polyp 1970   carcinoma in situ   Allergic rhinitis    Amaurosis fugax 08/07/2012   Anxiety    Ascending aortic aneurysm ([place]) 12/07/2015   36m by chest CTA 08/2020   Benign essential tremor syndrome    Bicuspid aortic valve    no AS on 07/2019   Carotid stenosis    1039 bilateral by dopplers 03/2017.    colon ca dx'd 1970   surg only   DDD (degenerative disc disease)    Diverticulitis    Diverticulosis    [place]    Duodenitis    peptic, with gastric heterotopia   Endometriosis    s/p hysterectomy   Fundic gland polyposis of stomach    GERD (gastroesophageal reflux disease)    Heart murmur    Hiatal hernia 02/03/2005   History of cerebral aneurysm repair    s/p coiling   History of hemorrhoids    with bleeding   History of shingles    HLD (hyperlipidemia)    HTN (hypertension)    Hypercholesteremia    IBS (irritable bowel syndrome)    Iron deficiency    Lung nodule 07/12/2021   CT 05/2021: 3 mm right solid pulmonary nodule-no routine follow-up imaging recommended   Migraine    MVP (mitral valve prolapse)    Osteopenia    Peripheral neuropathy    Toe fracture, right    second toe   UTI (lower urinary tract infection)    Varicose vein    Past Surgical History:  Procedure  Laterality Date   ABDOMINAL HYSTERECTOMY     APPENDECTOMY     CARPAL TUNNEL RELEASE  08/26/07   CATARACT EXTRACTION     CEREBRAL ANEURYSM REPAIR     COLON RESECTION     COLONOSCOPY  06/07/08   divertiulosis, internal hemorrhoids   COLONOSCOPY W/ BIOPSIES AND POLYPECTOMY  02/03/05   diverticulosis, 4 mm sessile polyps, internal and external hemorrhoids   CORONARY ANGIOPLASTY WITH STENT PLACEMENT     ESOPHAGOGASTRODUODENOSCOPY  02/03/05   hiatal hernia, 6 benign gastric polyps   FLEXIBLE SIGMOIDOSCOPY     HAND SURGERY Right    INTRAOCULAR LENS INSERTION     right hand decompressive fasciotomy  08/26/07   , dorsal and volar    TONSILLECTOMY      Allergies  Allergies  Allergen Reactions   Actos [Pioglitazone] Other (See Comments)    Chronic pedal edema   Amoxil [Amoxicillin] Other (See Comments)    Stomach upset   Avandia [Rosiglitazone] Other (See Comments)    Chronic pedal edema   Fosamax [Alendronate Sodium] Other (See Comments)    Unknown reaction   Ms Contin [Morphine] Other (See Comments)    Body spasms    Ultram [Tramadol] Shortness Of Breath and Palpitations   Amaryl [Glimepiride] Other (See Comments)    Unknown reaction   Januvia [Sitagliptin] Other (See Comments)    Unknown reaction   Lipitor [Atorvastatin] Swelling    Myalgias    Metformin And Related Other (See Comments)    Gastritis    Prandin [Repaglinide] Other (See Comments)    Abdominal pain   Tradjenta [Linagliptin] Other (See Comments)    GI issues   Zoloft [Sertraline Hcl] Other (See Comments)    Altered mental state   Asa [Aspirin] Other (See Comments)    Bleeding ulcers    Bactrim [Sulfamethoxazole-Trimethoprim] Hives   Bentyl [Dicyclomine Hcl] Other (See Comments)    Near syncope Weakness    Cipro [Ciprofloxacin Hcl] Nausea And Vomiting and Other (See Comments)    Stomach upset   Hctz [Hydrochlorothiazide] Other (See Comments)    Urinary issues   Keflex [Cephalexin] Other (See Comments)    Unknown reaction   Morphine And Related Other (See Comments)    Body spasms   Norvasc [Amlodipine Besylate] Other (See Comments)    Weakness Dizziness    Oxycontin [Oxycodone] Other (See Comments)    Unknown reaction   Sulfa Antibiotics Hives   Penicillins Swelling and Rash     EKGs/Labs/Other Studies Reviewed:   The following studies were reviewed today:  ECHO COMPLETE WO IMAGING ENHANCING AGENT 07/01/2021 EF 60-65, no RWMA, mild LVH, normal RVSF, trivial MR, trivial AI, aortic root 37 mm, ascending aorta 41 mm    Chest/abdomen/pelvis angio 06/30/2021 IMPRESSION: 1. Unchanged 4.4 cm ascending aortic aneurysm.  Recommend annual imaging followup by CTA or MRA. This recommendation follows 2010 ACCF/AHA/AATS/ACR/ASA/SCA/SCAI/SIR/STS/SVM Guidelines for the Diagnosis and Management of Patients with Thoracic Aortic Disease. Circulation. 2010; 121: W295-A213. Aortic aneurysm NOS (ICD10-I71.9) 2. No evidence for aortic dissection. 3. No acute localizing process in the chest, abdomen or pelvis. 4. Stable mild cardiomegaly. 5. 3 mm right solid pulmonary nodule. No routine follow-up imaging is recommended per Fleischner Society Guidelines. These guidelines do not apply to immunocompromised patients and patients with cancer. Follow up in patients with significant comorbidities as clinically warranted. For lung cancer screening, adhere to Lung-RADS guidelines. Reference: Radiology. 2017; 284(1):228-43. 6. Colonic diverticulosis with large amount of stool in the rectum. 7.  Aortic Atherosclerosis (  ICD10-I70.0).   Carotid US 01/16/2018 Bilateral ICA 1-39   Event monitor 08/24/2017 Sinus bradycardia, Normal sinus rhythm and sinust tachycardia with average herat rate 57bpm. The HR ranged from 46 to 120bpm. Occasional PVCs with PVC load < 1%   Myoview 05/18/2015 EF 64; no ischemia or infarction; low risk   Cardiac catheterization 08/17/2000 EF 62 Normal coronary arteries EKG:  EKG is *** ordered today.  The ekg ordered today demonstrates ***  Recent Labs: 01/01/2022: TSH 0.899 02/03/2022: B Natriuretic Peptide 970.6 02/16/2022: ALT 25; Hemoglobin 13.9; Magnesium 1.9; Platelets 200 02/18/2022: BUN 47; Creatinine, Ser 1.15; Potassium 4.4; Sodium 134  Recent Lipid Panel    Component Value Date/Time   CHOL 144 01/02/2022 1056   TRIG 101 01/02/2022 1056   HDL 32 (L) 01/02/2022 1056   CHOLHDL 4.5 01/02/2022 1056   VLDL 20 01/02/2022 1056   LDLCALC 92 01/02/2022 1056    Risk Assessment/Calculations:  {Does this patient have ATRIAL FIBRILLATION?[phone]}  Home Medications   No outpatient medications  have been marked as taking for the 03/10/22 encounter (Appointment) with Elgie Collard, PA-C.     Review of Systems   ***   All other systems reviewed and are otherwise negative except as noted above.  Physical Exam    VS:  There were no vitals taken for this visit. , BMI There is no height or weight on file to calculate BMI.  Wt Readings from Last 3 Encounters:  02/18/22 140 lb 3.2 oz (63.6 kg)  01/03/22 159 lb 2.8 oz (72.2 kg)  07/13/21 147 lb 9.6 oz (67 kg)     GEN: Well nourished, well developed, in no acute distress. HEENT: normal. Neck: Supple, no JVD, carotid bruits, or masses. Cardiac: ***RRR, no murmurs, rubs, or gallops. No clubbing, cyanosis, edema.  ***Radials/PT 2+ and equal bilaterally.  Respiratory:  ***Respirations regular and unlabored, clear to auscultation bilaterally. GI: Soft, nontender, nondistended. MS: No deformity or atrophy. Skin: Warm and dry, no rash. Neuro:  Strength and sensation are intact. Psych: Normal affect.  Assessment & Plan    Ascending aortic aneurysm HFpEF Hypertension Lung nodule  No BP recorded.  {Refresh Note OR Click here to enter BP  :1}***      Disposition: Follow up {follow up:15908} with Fransico Him, MD or APP.  Signed, Elgie Collard, PA-C 03/09/2022, 1:05 PM [facility]

## 2022-03-10 ENCOUNTER — Encounter (HOSPITAL_COMMUNITY): Payer: Self-pay | Admitting: *Deleted

## 2022-03-10 ENCOUNTER — Telehealth: Payer: Self-pay | Admitting: Cardiology

## 2022-03-10 ENCOUNTER — Ambulatory Visit: Payer: Medicare PPO | Attending: Physician Assistant | Admitting: Physician Assistant

## 2022-03-10 DIAGNOSIS — R911 Solitary pulmonary nodule: Secondary | ICD-10-CM

## 2022-03-10 DIAGNOSIS — F411 Generalized anxiety disorder: Secondary | ICD-10-CM | POA: Diagnosis not present

## 2022-03-10 DIAGNOSIS — E1142 Type 2 diabetes mellitus with diabetic polyneuropathy: Secondary | ICD-10-CM | POA: Diagnosis not present

## 2022-03-10 DIAGNOSIS — I480 Paroxysmal atrial fibrillation: Secondary | ICD-10-CM | POA: Diagnosis not present

## 2022-03-10 DIAGNOSIS — D649 Anemia, unspecified: Secondary | ICD-10-CM | POA: Diagnosis not present

## 2022-03-10 DIAGNOSIS — I5030 Unspecified diastolic (congestive) heart failure: Secondary | ICD-10-CM

## 2022-03-10 DIAGNOSIS — I2510 Atherosclerotic heart disease of native coronary artery without angina pectoris: Secondary | ICD-10-CM | POA: Diagnosis not present

## 2022-03-10 DIAGNOSIS — F0394 Unspecified dementia, unspecified severity, with anxiety: Secondary | ICD-10-CM | POA: Diagnosis not present

## 2022-03-10 DIAGNOSIS — I10 Essential (primary) hypertension: Secondary | ICD-10-CM

## 2022-03-10 DIAGNOSIS — E1151 Type 2 diabetes mellitus with diabetic peripheral angiopathy without gangrene: Secondary | ICD-10-CM | POA: Diagnosis not present

## 2022-03-10 DIAGNOSIS — I7121 Aneurysm of the ascending aorta, without rupture: Secondary | ICD-10-CM

## 2022-03-10 DIAGNOSIS — I5032 Chronic diastolic (congestive) heart failure: Secondary | ICD-10-CM | POA: Diagnosis not present

## 2022-03-10 DIAGNOSIS — I110 Hypertensive heart disease with heart failure: Secondary | ICD-10-CM | POA: Diagnosis not present

## 2022-03-10 NOTE — Telephone Encounter (Signed)
Left voicemail to call the clinic.

## 2022-03-10 NOTE — Telephone Encounter (Signed)
Pt c/o medication issue:  1. Name of Medication: Ezetimibe 10 mg  2. How are you currently taking this medication (dosage and times per day)?   3. Are you having a reaction (difficulty breathing--STAT)?   4. What is your medication issue? She wants to know if patient is supposed to be taking this medicine?she said the patient says she is not taking it  Pt c/o medication issue:  1. Name of Medication: Furosemide 20 mg, Pantoprazole,?? Mg  Spironolactone 25 mg and Trandolapril 4 mg  2. How are you currently taking this medication (dosage and times per day)?   3. Are you having a reaction (difficulty breathing--STAT)?   4. What is your medication issue? Clarification of  directions on how to take these medicine- she wants you to call the patient ad go over all the directions

## 2022-03-11 NOTE — Telephone Encounter (Signed)
Received call from patient's [facility] or [facility] regarding questions about patient's medication. Patient answering phone and able to review medications. Patient states that she had a recent stay in hospital from 02/03/22-02/18/22. She was in short term rehab for 1-2 weeks after that. She was supposed to see Nicholes Rough 03/10/22 yesterday for post hospitalization f/u but she states she was too weak to attend. She states she is dependent on a walker since her hospital stay and has limited family ability to help her attend visits.  Reviewed medications listed in epic, patient states several are missing. Reviewing discharge document from 02/18/22, several of her blood pressure meds (carvedilol, hydralazine, spironolactone, trandolapril) were discontinued but patient states she is now taking hydralazine and spironolactone again as she was put back on them at rehab. Patient states she has an appointment with Dr. Moreen Fowler 03/18/22 and has family assistance to get to that appt, she plans to review her meds with Dr. Moreen Fowler at that time.   Patient states she has been checking her HR and BP at home. Today her BP is 140/78 and HR is 78. She denies leg swelling and SOB. Offered to try and get patient in to discuss her meds sooner but patient declines at this time. Patient verifies she is still taking her metoprolol, amiodarone and eliquis as ordered.

## 2022-03-15 DIAGNOSIS — E1142 Type 2 diabetes mellitus with diabetic polyneuropathy: Secondary | ICD-10-CM | POA: Diagnosis not present

## 2022-03-15 DIAGNOSIS — F0394 Unspecified dementia, unspecified severity, with anxiety: Secondary | ICD-10-CM | POA: Diagnosis not present

## 2022-03-15 DIAGNOSIS — E1151 Type 2 diabetes mellitus with diabetic peripheral angiopathy without gangrene: Secondary | ICD-10-CM | POA: Diagnosis not present

## 2022-03-15 DIAGNOSIS — I480 Paroxysmal atrial fibrillation: Secondary | ICD-10-CM | POA: Diagnosis not present

## 2022-03-15 DIAGNOSIS — I2510 Atherosclerotic heart disease of native coronary artery without angina pectoris: Secondary | ICD-10-CM | POA: Diagnosis not present

## 2022-03-15 DIAGNOSIS — I110 Hypertensive heart disease with heart failure: Secondary | ICD-10-CM | POA: Diagnosis not present

## 2022-03-15 DIAGNOSIS — I5032 Chronic diastolic (congestive) heart failure: Secondary | ICD-10-CM | POA: Diagnosis not present

## 2022-03-15 DIAGNOSIS — F411 Generalized anxiety disorder: Secondary | ICD-10-CM | POA: Diagnosis not present

## 2022-03-15 DIAGNOSIS — D649 Anemia, unspecified: Secondary | ICD-10-CM | POA: Diagnosis not present

## 2022-03-17 DIAGNOSIS — D649 Anemia, unspecified: Secondary | ICD-10-CM | POA: Diagnosis not present

## 2022-03-17 DIAGNOSIS — I5032 Chronic diastolic (congestive) heart failure: Secondary | ICD-10-CM | POA: Diagnosis not present

## 2022-03-17 DIAGNOSIS — I2510 Atherosclerotic heart disease of native coronary artery without angina pectoris: Secondary | ICD-10-CM | POA: Diagnosis not present

## 2022-03-17 DIAGNOSIS — E1142 Type 2 diabetes mellitus with diabetic polyneuropathy: Secondary | ICD-10-CM | POA: Diagnosis not present

## 2022-03-17 DIAGNOSIS — I480 Paroxysmal atrial fibrillation: Secondary | ICD-10-CM | POA: Diagnosis not present

## 2022-03-17 DIAGNOSIS — F411 Generalized anxiety disorder: Secondary | ICD-10-CM | POA: Diagnosis not present

## 2022-03-17 DIAGNOSIS — F0394 Unspecified dementia, unspecified severity, with anxiety: Secondary | ICD-10-CM | POA: Diagnosis not present

## 2022-03-17 DIAGNOSIS — E1151 Type 2 diabetes mellitus with diabetic peripheral angiopathy without gangrene: Secondary | ICD-10-CM | POA: Diagnosis not present

## 2022-03-17 DIAGNOSIS — I110 Hypertensive heart disease with heart failure: Secondary | ICD-10-CM | POA: Diagnosis not present

## 2022-03-21 DIAGNOSIS — I5032 Chronic diastolic (congestive) heart failure: Secondary | ICD-10-CM | POA: Diagnosis not present

## 2022-03-21 DIAGNOSIS — I110 Hypertensive heart disease with heart failure: Secondary | ICD-10-CM | POA: Diagnosis not present

## 2022-03-21 DIAGNOSIS — D649 Anemia, unspecified: Secondary | ICD-10-CM | POA: Diagnosis not present

## 2022-03-21 DIAGNOSIS — F0394 Unspecified dementia, unspecified severity, with anxiety: Secondary | ICD-10-CM | POA: Diagnosis not present

## 2022-03-21 DIAGNOSIS — I2510 Atherosclerotic heart disease of native coronary artery without angina pectoris: Secondary | ICD-10-CM | POA: Diagnosis not present

## 2022-03-21 DIAGNOSIS — F411 Generalized anxiety disorder: Secondary | ICD-10-CM | POA: Diagnosis not present

## 2022-03-21 DIAGNOSIS — E1142 Type 2 diabetes mellitus with diabetic polyneuropathy: Secondary | ICD-10-CM | POA: Diagnosis not present

## 2022-03-21 DIAGNOSIS — I480 Paroxysmal atrial fibrillation: Secondary | ICD-10-CM | POA: Diagnosis not present

## 2022-03-21 DIAGNOSIS — E1151 Type 2 diabetes mellitus with diabetic peripheral angiopathy without gangrene: Secondary | ICD-10-CM | POA: Diagnosis not present

## 2022-03-24 DIAGNOSIS — I2510 Atherosclerotic heart disease of native coronary artery without angina pectoris: Secondary | ICD-10-CM | POA: Diagnosis not present

## 2022-03-24 DIAGNOSIS — I110 Hypertensive heart disease with heart failure: Secondary | ICD-10-CM | POA: Diagnosis not present

## 2022-03-24 DIAGNOSIS — D649 Anemia, unspecified: Secondary | ICD-10-CM | POA: Diagnosis not present

## 2022-03-24 DIAGNOSIS — F0394 Unspecified dementia, unspecified severity, with anxiety: Secondary | ICD-10-CM | POA: Diagnosis not present

## 2022-03-24 DIAGNOSIS — F411 Generalized anxiety disorder: Secondary | ICD-10-CM | POA: Diagnosis not present

## 2022-03-24 DIAGNOSIS — I480 Paroxysmal atrial fibrillation: Secondary | ICD-10-CM | POA: Diagnosis not present

## 2022-03-24 DIAGNOSIS — E1151 Type 2 diabetes mellitus with diabetic peripheral angiopathy without gangrene: Secondary | ICD-10-CM | POA: Diagnosis not present

## 2022-03-24 DIAGNOSIS — E1142 Type 2 diabetes mellitus with diabetic polyneuropathy: Secondary | ICD-10-CM | POA: Diagnosis not present

## 2022-03-24 DIAGNOSIS — I5032 Chronic diastolic (congestive) heart failure: Secondary | ICD-10-CM | POA: Diagnosis not present

## 2022-03-28 DIAGNOSIS — I2510 Atherosclerotic heart disease of native coronary artery without angina pectoris: Secondary | ICD-10-CM | POA: Diagnosis not present

## 2022-03-28 DIAGNOSIS — E1142 Type 2 diabetes mellitus with diabetic polyneuropathy: Secondary | ICD-10-CM | POA: Diagnosis not present

## 2022-03-28 DIAGNOSIS — F0394 Unspecified dementia, unspecified severity, with anxiety: Secondary | ICD-10-CM | POA: Diagnosis not present

## 2022-03-28 DIAGNOSIS — D649 Anemia, unspecified: Secondary | ICD-10-CM | POA: Diagnosis not present

## 2022-03-28 DIAGNOSIS — I5032 Chronic diastolic (congestive) heart failure: Secondary | ICD-10-CM | POA: Diagnosis not present

## 2022-03-28 DIAGNOSIS — E1151 Type 2 diabetes mellitus with diabetic peripheral angiopathy without gangrene: Secondary | ICD-10-CM | POA: Diagnosis not present

## 2022-03-28 DIAGNOSIS — F411 Generalized anxiety disorder: Secondary | ICD-10-CM | POA: Diagnosis not present

## 2022-03-28 DIAGNOSIS — I480 Paroxysmal atrial fibrillation: Secondary | ICD-10-CM | POA: Diagnosis not present

## 2022-03-28 DIAGNOSIS — I110 Hypertensive heart disease with heart failure: Secondary | ICD-10-CM | POA: Diagnosis not present

## 2022-03-31 DIAGNOSIS — E1151 Type 2 diabetes mellitus with diabetic peripheral angiopathy without gangrene: Secondary | ICD-10-CM | POA: Diagnosis not present

## 2022-03-31 DIAGNOSIS — I2510 Atherosclerotic heart disease of native coronary artery without angina pectoris: Secondary | ICD-10-CM | POA: Diagnosis not present

## 2022-03-31 DIAGNOSIS — I480 Paroxysmal atrial fibrillation: Secondary | ICD-10-CM | POA: Diagnosis not present

## 2022-03-31 DIAGNOSIS — I110 Hypertensive heart disease with heart failure: Secondary | ICD-10-CM | POA: Diagnosis not present

## 2022-03-31 DIAGNOSIS — F0394 Unspecified dementia, unspecified severity, with anxiety: Secondary | ICD-10-CM | POA: Diagnosis not present

## 2022-03-31 DIAGNOSIS — I5032 Chronic diastolic (congestive) heart failure: Secondary | ICD-10-CM | POA: Diagnosis not present

## 2022-03-31 DIAGNOSIS — F411 Generalized anxiety disorder: Secondary | ICD-10-CM | POA: Diagnosis not present

## 2022-03-31 DIAGNOSIS — D649 Anemia, unspecified: Secondary | ICD-10-CM | POA: Diagnosis not present

## 2022-03-31 DIAGNOSIS — E1142 Type 2 diabetes mellitus with diabetic polyneuropathy: Secondary | ICD-10-CM | POA: Diagnosis not present

## 2022-04-04 DIAGNOSIS — E1142 Type 2 diabetes mellitus with diabetic polyneuropathy: Secondary | ICD-10-CM | POA: Diagnosis not present

## 2022-04-04 DIAGNOSIS — I480 Paroxysmal atrial fibrillation: Secondary | ICD-10-CM | POA: Diagnosis not present

## 2022-04-04 DIAGNOSIS — D649 Anemia, unspecified: Secondary | ICD-10-CM | POA: Diagnosis not present

## 2022-04-04 DIAGNOSIS — F0394 Unspecified dementia, unspecified severity, with anxiety: Secondary | ICD-10-CM | POA: Diagnosis not present

## 2022-04-04 DIAGNOSIS — I110 Hypertensive heart disease with heart failure: Secondary | ICD-10-CM | POA: Diagnosis not present

## 2022-04-04 DIAGNOSIS — E1151 Type 2 diabetes mellitus with diabetic peripheral angiopathy without gangrene: Secondary | ICD-10-CM | POA: Diagnosis not present

## 2022-04-04 DIAGNOSIS — I5032 Chronic diastolic (congestive) heart failure: Secondary | ICD-10-CM | POA: Diagnosis not present

## 2022-04-04 DIAGNOSIS — F411 Generalized anxiety disorder: Secondary | ICD-10-CM | POA: Diagnosis not present

## 2022-04-04 DIAGNOSIS — I2510 Atherosclerotic heart disease of native coronary artery without angina pectoris: Secondary | ICD-10-CM | POA: Diagnosis not present

## 2022-04-07 DIAGNOSIS — D649 Anemia, unspecified: Secondary | ICD-10-CM | POA: Diagnosis not present

## 2022-04-07 DIAGNOSIS — F411 Generalized anxiety disorder: Secondary | ICD-10-CM | POA: Diagnosis not present

## 2022-04-07 DIAGNOSIS — F0394 Unspecified dementia, unspecified severity, with anxiety: Secondary | ICD-10-CM | POA: Diagnosis not present

## 2022-04-07 DIAGNOSIS — E1142 Type 2 diabetes mellitus with diabetic polyneuropathy: Secondary | ICD-10-CM | POA: Diagnosis not present

## 2022-04-07 DIAGNOSIS — I2510 Atherosclerotic heart disease of native coronary artery without angina pectoris: Secondary | ICD-10-CM | POA: Diagnosis not present

## 2022-04-07 DIAGNOSIS — E1151 Type 2 diabetes mellitus with diabetic peripheral angiopathy without gangrene: Secondary | ICD-10-CM | POA: Diagnosis not present

## 2022-04-07 DIAGNOSIS — I5032 Chronic diastolic (congestive) heart failure: Secondary | ICD-10-CM | POA: Diagnosis not present

## 2022-04-07 DIAGNOSIS — I110 Hypertensive heart disease with heart failure: Secondary | ICD-10-CM | POA: Diagnosis not present

## 2022-04-07 DIAGNOSIS — I480 Paroxysmal atrial fibrillation: Secondary | ICD-10-CM | POA: Diagnosis not present

## 2022-04-09 DIAGNOSIS — E1151 Type 2 diabetes mellitus with diabetic peripheral angiopathy without gangrene: Secondary | ICD-10-CM | POA: Diagnosis not present

## 2022-04-09 DIAGNOSIS — I5032 Chronic diastolic (congestive) heart failure: Secondary | ICD-10-CM | POA: Diagnosis not present

## 2022-04-09 DIAGNOSIS — I110 Hypertensive heart disease with heart failure: Secondary | ICD-10-CM | POA: Diagnosis not present

## 2022-04-09 DIAGNOSIS — F0394 Unspecified dementia, unspecified severity, with anxiety: Secondary | ICD-10-CM | POA: Diagnosis not present

## 2022-04-09 DIAGNOSIS — I480 Paroxysmal atrial fibrillation: Secondary | ICD-10-CM | POA: Diagnosis not present

## 2022-04-09 DIAGNOSIS — E1142 Type 2 diabetes mellitus with diabetic polyneuropathy: Secondary | ICD-10-CM | POA: Diagnosis not present

## 2022-04-09 DIAGNOSIS — I2510 Atherosclerotic heart disease of native coronary artery without angina pectoris: Secondary | ICD-10-CM | POA: Diagnosis not present

## 2022-04-09 DIAGNOSIS — F411 Generalized anxiety disorder: Secondary | ICD-10-CM | POA: Diagnosis not present

## 2022-04-09 DIAGNOSIS — D649 Anemia, unspecified: Secondary | ICD-10-CM | POA: Diagnosis not present

## 2022-04-13 DIAGNOSIS — F0394 Unspecified dementia, unspecified severity, with anxiety: Secondary | ICD-10-CM | POA: Diagnosis not present

## 2022-04-13 DIAGNOSIS — I2510 Atherosclerotic heart disease of native coronary artery without angina pectoris: Secondary | ICD-10-CM | POA: Diagnosis not present

## 2022-04-13 DIAGNOSIS — E1142 Type 2 diabetes mellitus with diabetic polyneuropathy: Secondary | ICD-10-CM | POA: Diagnosis not present

## 2022-04-13 DIAGNOSIS — E1151 Type 2 diabetes mellitus with diabetic peripheral angiopathy without gangrene: Secondary | ICD-10-CM | POA: Diagnosis not present

## 2022-04-13 DIAGNOSIS — I5032 Chronic diastolic (congestive) heart failure: Secondary | ICD-10-CM | POA: Diagnosis not present

## 2022-04-13 DIAGNOSIS — I110 Hypertensive heart disease with heart failure: Secondary | ICD-10-CM | POA: Diagnosis not present

## 2022-04-13 DIAGNOSIS — F411 Generalized anxiety disorder: Secondary | ICD-10-CM | POA: Diagnosis not present

## 2022-04-13 DIAGNOSIS — I480 Paroxysmal atrial fibrillation: Secondary | ICD-10-CM | POA: Diagnosis not present

## 2022-04-13 DIAGNOSIS — D649 Anemia, unspecified: Secondary | ICD-10-CM | POA: Diagnosis not present

## 2022-04-18 DIAGNOSIS — F411 Generalized anxiety disorder: Secondary | ICD-10-CM | POA: Diagnosis not present

## 2022-04-18 DIAGNOSIS — I480 Paroxysmal atrial fibrillation: Secondary | ICD-10-CM | POA: Diagnosis not present

## 2022-04-18 DIAGNOSIS — E1142 Type 2 diabetes mellitus with diabetic polyneuropathy: Secondary | ICD-10-CM | POA: Diagnosis not present

## 2022-04-18 DIAGNOSIS — E1151 Type 2 diabetes mellitus with diabetic peripheral angiopathy without gangrene: Secondary | ICD-10-CM | POA: Diagnosis not present

## 2022-04-18 DIAGNOSIS — I5032 Chronic diastolic (congestive) heart failure: Secondary | ICD-10-CM | POA: Diagnosis not present

## 2022-04-18 DIAGNOSIS — F0394 Unspecified dementia, unspecified severity, with anxiety: Secondary | ICD-10-CM | POA: Diagnosis not present

## 2022-04-18 DIAGNOSIS — D649 Anemia, unspecified: Secondary | ICD-10-CM | POA: Diagnosis not present

## 2022-04-18 DIAGNOSIS — I2510 Atherosclerotic heart disease of native coronary artery without angina pectoris: Secondary | ICD-10-CM | POA: Diagnosis not present

## 2022-04-18 DIAGNOSIS — I110 Hypertensive heart disease with heart failure: Secondary | ICD-10-CM | POA: Diagnosis not present

## 2022-04-26 DIAGNOSIS — I5032 Chronic diastolic (congestive) heart failure: Secondary | ICD-10-CM | POA: Diagnosis not present

## 2022-04-26 DIAGNOSIS — F0394 Unspecified dementia, unspecified severity, with anxiety: Secondary | ICD-10-CM | POA: Diagnosis not present

## 2022-04-26 DIAGNOSIS — I480 Paroxysmal atrial fibrillation: Secondary | ICD-10-CM | POA: Diagnosis not present

## 2022-04-26 DIAGNOSIS — E1151 Type 2 diabetes mellitus with diabetic peripheral angiopathy without gangrene: Secondary | ICD-10-CM | POA: Diagnosis not present

## 2022-04-26 DIAGNOSIS — I2510 Atherosclerotic heart disease of native coronary artery without angina pectoris: Secondary | ICD-10-CM | POA: Diagnosis not present

## 2022-04-26 DIAGNOSIS — E1142 Type 2 diabetes mellitus with diabetic polyneuropathy: Secondary | ICD-10-CM | POA: Diagnosis not present

## 2022-04-26 DIAGNOSIS — F411 Generalized anxiety disorder: Secondary | ICD-10-CM | POA: Diagnosis not present

## 2022-04-26 DIAGNOSIS — I110 Hypertensive heart disease with heart failure: Secondary | ICD-10-CM | POA: Diagnosis not present

## 2022-04-26 DIAGNOSIS — D649 Anemia, unspecified: Secondary | ICD-10-CM | POA: Diagnosis not present

## 2022-05-06 DIAGNOSIS — I110 Hypertensive heart disease with heart failure: Secondary | ICD-10-CM | POA: Diagnosis not present

## 2022-05-06 DIAGNOSIS — F0394 Unspecified dementia, unspecified severity, with anxiety: Secondary | ICD-10-CM | POA: Diagnosis not present

## 2022-05-06 DIAGNOSIS — I5032 Chronic diastolic (congestive) heart failure: Secondary | ICD-10-CM | POA: Diagnosis not present

## 2022-05-06 DIAGNOSIS — I480 Paroxysmal atrial fibrillation: Secondary | ICD-10-CM | POA: Diagnosis not present

## 2022-05-06 DIAGNOSIS — E1151 Type 2 diabetes mellitus with diabetic peripheral angiopathy without gangrene: Secondary | ICD-10-CM | POA: Diagnosis not present

## 2022-05-06 DIAGNOSIS — E1142 Type 2 diabetes mellitus with diabetic polyneuropathy: Secondary | ICD-10-CM | POA: Diagnosis not present

## 2022-05-06 DIAGNOSIS — D649 Anemia, unspecified: Secondary | ICD-10-CM | POA: Diagnosis not present

## 2022-05-06 DIAGNOSIS — F411 Generalized anxiety disorder: Secondary | ICD-10-CM | POA: Diagnosis not present

## 2022-05-06 DIAGNOSIS — I2510 Atherosclerotic heart disease of native coronary artery without angina pectoris: Secondary | ICD-10-CM | POA: Diagnosis not present

## 2022-05-23 NOTE — Progress Notes (Deleted)
Cardiology Office Note:    Date:  05/23/2022  ID:  [name], DOB 12/03/1932, MRN [id number] PCP: Tally Joe, MD  [facility] Providers Cardiologist:  Armanda Magic, MD { Click to update primary MD,subspecialty MD or APP then REFRESH:1}    {Click to Open Review  :1}   Patient Profile:   Paroxysmal atrial fibrillation  Admx in 12/2021 w AF w RVR, ? hsTrops c/w NSTEMI (pk 3162) Hx of NSTEMI 12/2021 In setting of AF w RVR Planned OP ischemic eval (pt declined cardiac catheterization) Thoracic aortic aneurysm CT 08/2020: 44 mm CT 05/2021: 44 mm Chest CTA 01/02/2022: Ascending thoracic aorta 46 mm (HFpEF) heart failure with preserved ejection fraction  Admit 05/2021 TTE 07/01/21: EF 60-65, no RWMA, mild LVH, normal RVSF, trivial MR, trivial AI, aortic root 37 mm, ascending aorta 41 mm  TTE 01/03/2022: Inferior HK/AK, EF 55-60, GR 1 DD, low normal RVSF, normal PASP, mild LAE, mild MR, mild dilation of ascending aorta at 39 mm, RAP 3, mean AV gradient 5 Hypertension Hyperlipidemia Mitral valve prolapse Mild mitral regurgitation Echo 07/2021: Trivial MR Diabetes mellitus Carotid artery disease Carotid US 01/16/2018: Bilateral ICA 1-39 Peptic ulcer disease with hiatal hernia Cerebral aneurysm s/p coiling Hx of ?TIA Monitor x4 days (reaction to pads)-no arrhythmias during that time Mild pulmonary hypertension Echo 2019: PASP 40 Hx of chronic chest pain and shortness of breath Cardiac catheterization 07/2000: Normal coronary arteries Myoview 05/18/2015: EF 64, no ischemia, low risk Colon CA Increased social stress Primary caregiver for her husband who has dementia Aortic atherosclerosis    History of Present Illness:   [name] is a 89 y.o. female *** She was last seen in June 2023.  She was admitted in December 2023 with chest pain and atrial fibrillation with rapid ventricular rate.  Her troponins were elevated (peak 3162).  EF was normal with inferior wall motion  abnormality.  She declined proceeding with cardiac catheterization due to associated risks.  She was to be set up for an outpatient noninvasive study.  She was placed on Eliquis for anticoagulation.  She was admitted again 1/4-1/19 with nausea, vomiting diarrhea due to suspected viral gastroenteritis.  Her hospitalization was complicated by atrial fibrillation with rapid rate and acute on chronic heart failure.  She had bradycardia and orthostatic hypotension necessitating reduced dose metoprolol.  She converted on amiodarone therapy.  She required IV Lasix.  Her troponins were minimally elevated without clear trend (163-147) consistent with demand ischemia.  She was discharged to SNF.***  ROS ***    Studies Reviewed:    EKG:  ***  *** Risk Assessment/Calculations:   {Does this patient have ATRIAL FIBRILLATION?[phone]} No BP recorded.  {Refresh Note OR Click here to enter BP  :1}***       Physical Exam:   VS:  There were no vitals taken for this visit.   Wt Readings from Last 3 Encounters:  02/18/22 140 lb 3.2 oz (63.6 kg)  01/03/22 159 lb 2.8 oz (72.2 kg)  07/13/21 147 lb 9.6 oz (67 kg)    Physical Exam***    ASSESSMENT AND PLAN:   No problem-specific Assessment & Plan notes found for this encounter. ***{  Ascending aortic aneurysm (HCC) Stable measurement by CT in May 2023 (44 mm).  She will need another CT in May 2024.   (HFpEF) heart failure with preserved ejection fraction (HCC) EF normal by echocardiogram 07/01/2021.  Overall, volume status stable.  She is NYHA IIb-III.  She cannot  tolerate blood pressures lower than 140s systolic.  I have recommended that she continue taking furosemide 20 mg daily.  We discussed when she would take an extra dose of furosemide.  Continue spironolactone 12.5 mg daily.  I do not think she would be able to tolerate Entresto.  We could consider SGLT2 inhibitor in the future.  Obtain BMET, magnesium today (she has a lot of PACs on exam).   Essential  hypertension Borderline control.  As noted, she cannot tolerate systolic blood pressures less than 140.  Continue furosemide 20 mg daily, hydralazine 10 mg 3 times a day, spironolactone 12.5 mg daily, trandolapril 4 mg twice daily.   Lung nodule 3 mm right solid pulmonary nodule noted on CT scan in the hospital.  Follow-up with PCP for further management.    :1}     {Are you ordering a CV Procedure (e.g. stress test, cath, DCCV, TEE, etc)?   Press F2        :[id number]}  Dispo:  No follow-ups on file.  Signed, Tereso Newcomer, PA-C

## 2022-05-24 ENCOUNTER — Ambulatory Visit: Payer: Medicare PPO | Attending: Physician Assistant | Admitting: Physician Assistant

## 2022-05-24 DIAGNOSIS — I252 Old myocardial infarction: Secondary | ICD-10-CM

## 2022-05-24 DIAGNOSIS — I5032 Chronic diastolic (congestive) heart failure: Secondary | ICD-10-CM

## 2022-05-24 DIAGNOSIS — I480 Paroxysmal atrial fibrillation: Secondary | ICD-10-CM

## 2022-07-15 ENCOUNTER — Telehealth: Payer: Self-pay | Admitting: Cardiology

## 2022-07-15 DIAGNOSIS — R001 Bradycardia, unspecified: Secondary | ICD-10-CM

## 2022-07-15 DIAGNOSIS — R002 Palpitations: Secondary | ICD-10-CM

## 2022-07-15 NOTE — Telephone Encounter (Signed)
Tried to call patient back, but number is ringing busy.

## 2022-07-15 NOTE — Telephone Encounter (Signed)
Called patient back about message. Patient complaining of low HR and weakness. Patient's BP HR 131/70  and HR 67 today. Patient stated her HR was as low as 59 yesterday. Encouraged patient to keep hydrated and make sure she's eating. Patient stated she has been eating and drinking plenty of fluid. Encouraged patient to keep active. Patient is taking metoprolol 12.5 mg BID, furosemide 20 mg PRN, hydralazine 10 mg TID, and amiodarone 200 mg daily. Will send message to Dr. Mayford Knife for advisement.

## 2022-07-15 NOTE — Telephone Encounter (Signed)
STAT if HR is under 50 or over 120 (normal HR is 60-100 beats per minute)  What is your heart rate? 67 this morning  Do you have a log of your heart rate readings (document readings)? 59 yesterday   Do you have any other symptoms? Patient states she has been feeling weak.  She states she have been having a low heart rate.

## 2022-07-15 NOTE — Telephone Encounter (Signed)
Called patient back with Dr. Norris Cross advisement for a 3 day monitor. Order placed and patient was agreeable to plan.

## 2022-07-18 ENCOUNTER — Ambulatory Visit: Payer: Medicare PPO | Attending: Cardiology

## 2022-07-18 DIAGNOSIS — R001 Bradycardia, unspecified: Secondary | ICD-10-CM

## 2022-07-18 DIAGNOSIS — R002 Palpitations: Secondary | ICD-10-CM

## 2022-07-18 NOTE — Progress Notes (Unsigned)
Enrolled patient for a 3 day Zio XT monitor to be mailed to patients home  

## 2022-07-26 DIAGNOSIS — R001 Bradycardia, unspecified: Secondary | ICD-10-CM | POA: Diagnosis not present

## 2022-07-26 DIAGNOSIS — R002 Palpitations: Secondary | ICD-10-CM

## 2022-07-28 ENCOUNTER — Other Ambulatory Visit: Payer: Self-pay | Admitting: Cardiology

## 2022-07-28 ENCOUNTER — Other Ambulatory Visit (HOSPITAL_COMMUNITY): Payer: Self-pay

## 2022-07-28 NOTE — Telephone Encounter (Signed)
Prescription refill request for Eliquis received. Indication: PAF Last office visit: 07/13/21  Wende Mott PA-C Scr: 1.15 on 02/18/22 Age: 89 Weight: 67kg  Based on above findings Eliquis 5mg  twice daily is the appropriate dose.  Refill approved.

## 2022-08-06 ENCOUNTER — Other Ambulatory Visit: Payer: Self-pay | Admitting: Cardiology

## 2022-08-11 DIAGNOSIS — R002 Palpitations: Secondary | ICD-10-CM | POA: Diagnosis not present

## 2022-08-11 DIAGNOSIS — R001 Bradycardia, unspecified: Secondary | ICD-10-CM | POA: Diagnosis not present

## 2022-08-17 ENCOUNTER — Telehealth: Payer: Self-pay

## 2022-08-17 NOTE — Telephone Encounter (Signed)
Called patient to discuss Dr. Norris Cross recommendation that she alert [place] if anything changes with her palpitations to call our office. Reviewed that if palpitations are more frequent, last longer or are more uncomfortable this could be a sign of condition worsening and to let us know. Reviewed ED precautions (call 911 if you feel SOB w/ chest pain that does not improve with rest.)

## 2022-08-17 NOTE — Telephone Encounter (Signed)
-----   Message from Armanda Magic sent at 08/14/2022  8:05 PM EDT ----- If palpitations become more frequent or last longer she needs to let me know ----- Message ----- From: Frutoso Schatz, RN Sent: 08/12/2022  10:38 AM EDT To: Quintella Reichert, MD  The patient has been notified of the result and verbalized understanding.  All questions (if any) were answered. Frutoso Schatz, RN 08/12/2022 10:37 AM   Patient reports palpitations at night when she is lying down occasionally. She states that they are not bothersome

## 2022-08-22 ENCOUNTER — Other Ambulatory Visit: Payer: Self-pay | Admitting: Cardiology

## 2022-08-27 ENCOUNTER — Other Ambulatory Visit: Payer: Self-pay | Admitting: Cardiology

## 2022-09-05 ENCOUNTER — Ambulatory Visit (INDEPENDENT_AMBULATORY_CARE_PROVIDER_SITE_OTHER): Payer: Medicare PPO

## 2022-09-05 ENCOUNTER — Ambulatory Visit: Payer: Medicare PPO | Admitting: Podiatry

## 2022-09-05 DIAGNOSIS — M79671 Pain in right foot: Secondary | ICD-10-CM

## 2022-09-05 DIAGNOSIS — B351 Tinea unguium: Secondary | ICD-10-CM

## 2022-09-05 DIAGNOSIS — M79672 Pain in left foot: Secondary | ICD-10-CM

## 2022-09-05 DIAGNOSIS — M79674 Pain in right toe(s): Secondary | ICD-10-CM | POA: Diagnosis not present

## 2022-09-05 DIAGNOSIS — M79675 Pain in left toe(s): Secondary | ICD-10-CM

## 2022-09-05 NOTE — Progress Notes (Signed)
Subjective:  Patient ID: [name], female    DOB: 1932/06/14,  MRN: [id number]  Patient notes nails are thick, discolored, elongated and painful in shoegear when trying to ambulate.    PCP is Tally Joe, MD.  Allergies  Allergen Reactions   Actos [Pioglitazone] Other (See Comments)    Chronic pedal edema   Amoxil [Amoxicillin] Other (See Comments)    Stomach upset   Avandia [Rosiglitazone] Other (See Comments)    Chronic pedal edema   Fosamax [Alendronate Sodium] Other (See Comments)    Unknown reaction   Ms Contin [Morphine] Other (See Comments)    Body spasms    Ultram [Tramadol] Shortness Of Breath and Palpitations   Amaryl [Glimepiride] Other (See Comments)    Unknown reaction   Januvia [Sitagliptin] Other (See Comments)    Unknown reaction   Lipitor [Atorvastatin] Swelling    Myalgias    Metformin And Related Other (See Comments)    Gastritis    Prandin [Repaglinide] Other (See Comments)    Abdominal pain   Tradjenta [Linagliptin] Other (See Comments)    GI issues   Zoloft [Sertraline Hcl] Other (See Comments)    Altered mental state   Asa [Aspirin] Other (See Comments)    Bleeding ulcers    Bactrim [Sulfamethoxazole-Trimethoprim] Hives   Bentyl [Dicyclomine Hcl] Other (See Comments)    Near syncope Weakness    Cipro [Ciprofloxacin Hcl] Nausea And Vomiting and Other (See Comments)    Stomach upset   Hctz [Hydrochlorothiazide] Other (See Comments)    Urinary issues   Keflex [Cephalexin] Other (See Comments)    Unknown reaction   Morphine And Codeine Other (See Comments)    Body spasms   Norvasc [Amlodipine Besylate] Other (See Comments)    Weakness Dizziness    Oxycontin [Oxycodone] Other (See Comments)    Unknown reaction   Sulfa Antibiotics Hives   Penicillins Swelling and Rash    Review of Systems: Negative except as noted in the HPI.  Objective:  There were no vitals filed for this visit.  [name] is a pleasant 89 y.o.  female in NAD. AAO x 3.  Vascular Examination: Capillary refill time is 3-5 seconds to toes bilateral. Palpable pedal pulses b/l LE. Digital hair present b/l. No pedal edema b/l. Skin temperature gradient WNL b/l. No varicosities b/l. No cyanosis or clubbing noted b/l.   Dermatological Examination: Pedal skin with normal turgor, texture and tone b/l. No open wounds. No interdigital macerations b/l. Toenails x10 are 3mm thick, discolored, dystrophic with subungual debris. There is pain with compression of the nail plates.  They are elongated x10     Latest Ref Rng & Units 01/02/2022   10:56 AM  Hemoglobin A1C  Hemoglobin-A1c 4.8 - 5.6 % 6.2    Assessment/Plan: 1. Right foot pain   2. Left foot pain   3. Pain due to onychomycosis of toenails of both feet    The mycotic toenails were sharply debrided x10 with sterile nail nippers and a power debriding burr to decrease bulk/thickness and length.    Return in about 3 months (around 12/06/2022) for [facility].   Clerance Lav, DPM, FACFAS [facility]     [address]  [place], [place] [address]                Office [phone]  Fax [phone]

## 2022-09-22 ENCOUNTER — Telehealth: Payer: Self-pay | Admitting: Cardiology

## 2022-09-22 NOTE — Telephone Encounter (Signed)
Advised that patient may need to do labs as she has not been seen in a year, patient would like to keep appointment as an in person visit. Also patient does not have computer access to be able to do a virtual visit.

## 2022-09-22 NOTE — Telephone Encounter (Signed)
Pt would like to know if she can change her appt on Monday 8/26 to a virtual appt. Please advise

## 2022-09-26 ENCOUNTER — Encounter: Payer: Self-pay | Admitting: Physician Assistant

## 2022-09-26 ENCOUNTER — Ambulatory Visit: Payer: Medicare PPO | Attending: Physician Assistant | Admitting: Physician Assistant

## 2022-09-26 VITALS — BP 130/80 | HR 73 | Ht 64.0 in | Wt 151.6 lb

## 2022-09-26 DIAGNOSIS — I712 Thoracic aortic aneurysm, without rupture, unspecified: Secondary | ICD-10-CM | POA: Diagnosis not present

## 2022-09-26 DIAGNOSIS — I2510 Atherosclerotic heart disease of native coronary artery without angina pectoris: Secondary | ICD-10-CM | POA: Diagnosis not present

## 2022-09-26 DIAGNOSIS — I10 Essential (primary) hypertension: Secondary | ICD-10-CM

## 2022-09-26 DIAGNOSIS — R911 Solitary pulmonary nodule: Secondary | ICD-10-CM | POA: Diagnosis not present

## 2022-09-26 DIAGNOSIS — I5032 Chronic diastolic (congestive) heart failure: Secondary | ICD-10-CM

## 2022-09-26 DIAGNOSIS — I480 Paroxysmal atrial fibrillation: Secondary | ICD-10-CM

## 2022-09-26 DIAGNOSIS — I7121 Aneurysm of the ascending aorta, without rupture: Secondary | ICD-10-CM

## 2022-09-26 DIAGNOSIS — I340 Nonrheumatic mitral (valve) insufficiency: Secondary | ICD-10-CM | POA: Diagnosis not present

## 2022-09-26 HISTORY — DX: Atherosclerotic heart disease of native coronary artery without angina pectoris: I25.10

## 2022-09-26 NOTE — Assessment & Plan Note (Signed)
Mild mitral regurgitation on most recent echocardiogram in December 2023.

## 2022-09-26 NOTE — Assessment & Plan Note (Signed)
Stable at 46mm on most recent CT in December 2023. -Plan for repeat CT in December 2024.

## 2022-09-26 NOTE — Assessment & Plan Note (Signed)
Coronary calcification noted on CT scan in December 2023, managed medically per patient preference.  Troponins were elevated in December 2023 in the setting of atrial fibrillation with rapid rate.  The seem to be consistent with non-ST elevation myocardial infarction versus demand ischemia.  She preferred conservative management and did not undergo cardiac catheterization. -Continue metoprolol tartrate 12.5 mg twice daily -Follow-up 6 months

## 2022-09-26 NOTE — Progress Notes (Signed)
Cardiology Office Note:    Date:  09/26/2022  ID:  [name], DOB 1932-02-03, MRN [id number] PCP: Tally Joe, MD  [facility] Providers Cardiologist:  Armanda Magic, MD       Patient Profile:      Thoracic aortic aneurysm CT 08/2020: 44 mm // CT 05/2021: 44 mm // CT 12/2021: 46 mm (HFpEF) heart failure with preserved ejection fraction  TTE 07/01/21: EF 60-65, no RWMA, mild LVH, normal RVSF, trivial MR, trivial AI, aortic root 37 mm, ascending aorta 41 mm  TTE 01/03/2022: Basal inferior HK/AK, EF 55-60, GR 1 DD, low normal RVSF, normal PASP, mild LAE, mild MR, trivial AI, mean AV gradient 5 mmHg, ascending aorta 39 mm, RAP 3 Paroxysmal atrial fibrillation  Admx 12/2021 w AF w RVR, +hsT (132>>2858) c/w NSTEMI (prob demand ischemia) Pt declined LHC; OP ischemic eval if pt ok to proceed to cath if abnl Spont conversion to NSR; Eliquis started Admx 01/2022 w gastroenteritis and recurrent AF w RVR + a/c CHF >> Amiodarone  Monitor 08/2022: Sinus bradycardia; 8 runs of SVT (atrial tachycardia) Coronary artery calcification on CT Cardiac catheterization 2002: Normal coronary arteries Myoview 2017 low risk Admx 12/2021 w ? hsT in setting of AF w RVR; pt preferred conservative mgmt Mitral valve prolapse; Mild mitral regurgitation Echo 07/2021: Trivial MR Echo 12/2021: Mild MR Carotid artery disease [place] 01/16/2018: Bilateral ICA 1-39 Hypertension Hyperlipidemia Diabetes mellitus Peptic ulcer disease with hiatal hernia Cerebral aneurysm s/p coiling Hx of ?TIA Monitor x4 days (reaction to pads)-no arrhythmias during that time Mild pulmonary hypertension Echo 2019: PASP 40 Hx of chronic chest pain and shortness of breath Colon CA Increased social stress Primary caregiver for her husband who has dementia Aortic atherosclerosis  Lung nodules           Discussed the use of AI scribe software for clinical note transcription with the patient, who gave verbal consent to  proceed.  History of Present Illness   [name], an 89 year old patient with a history of CHF, atrial fibrillation, CAD, mitral regurgitation, and thoracic aortic aneurysm, returns for follow-up. She was last seen in June 2023 and had two hospital admissions in December 2023 and January 2024. In December 2023, she was admitted with atrial fibrillation with a rapid rate, which spontaneously converted to sinus rhythm. Her hs-Troponins were markedly elevated, consistent with NSTEMI. She opted for conservative management and did not pursue invasive evaluation. In January 2024, she was readmitted with gastroenteritis, complicated by recurrent atrial fibrillation and decompensated heart failure. She was placed on amiodarone, which restored sinus rhythm.  The patient has been experiencing some high blood pressures, which she attributes to stress. She lives at home with her 43 year old husband, who has health problems, and they have caregivers. She reports that her heart rate increases due to stress when dealing with the caregiver. She also reports lower extremity swelling, which goes down at night and comes up during the day. She has been weighing herself and has been instructed to increase her furosemide dosage if she gains three pounds or more overnight. However, she has not had to do this very often. She reports no chest discomfort, passing out, or dizziness.  She has not had significant shortness of breath.  She sleeps flat with one pillow and has not had any significant weight gain.     ROS:  See HPI No melena, hematochezia, hematuria. She does have occasional bleeding hemorrhoids.    Studies Reviewed:   EKG Interpretation Date/Time:  Monday September 26 2022 13:38:45 EDT Ventricular Rate:  73 PR Interval:  170 QRS Duration:  82 QT Interval:  370 QTC Calculation: 407 R Axis:   62  Text Interpretation: Sinus rhythm with Premature atrial complexes with Abberant conduction Normal axis J point  elevation Non-specific ST-TW changes No significant change when compared to multiple prior ECGs Confirmed by Tereso Newcomer [phone]) on 09/26/2022 1:42:46 PM    Risk Assessment/Calculations:    CHA2DS2-VASc Score = 7   This indicates a 11.2% annual risk of stroke. The patient's score is based upon: CHF History: 1 HTN History: 1 Diabetes History: 1 Stroke History: 0 Vascular Disease History: 1 Age Score: 2 Gender Score: 1            Physical Exam:   VS:  BP 130/80   Pulse 73   Ht 5\' 4"  (1.626 m)   Wt 151 lb 9.6 oz (68.8 kg)   SpO2 97%   BMI 26.02 kg/m    Wt Readings from Last 3 Encounters:  09/26/22 151 lb 9.6 oz (68.8 kg)  02/18/22 140 lb 3.2 oz (63.6 kg)  01/03/22 159 lb 2.8 oz (72.2 kg)    Constitutional:      Appearance: Healthy appearance. Not in distress.  Neck:     Vascular: No JVR.  Pulmonary:     Breath sounds: Normal breath sounds. No wheezing. No rales.  Cardiovascular:     Normal rate. Regular rhythm.     Murmurs: There is no murmur.  Edema:    Peripheral edema (non-pitting) present.    Pretibial: bilateral 1+ edema of the pretibial area. Abdominal:     Palpations: Abdomen is soft.      Assessment and Plan:  Paroxysmal atrial fibrillation [facility]) Previous hospitalizations in December 2023 and January 2024 for atrial fibrillation.  She is currently in sinus rhythm on Amiodarone 200mg  daily. She notes palpitations with increased stress only. She did have a few episodes of ATach on monitor in July 2024.  -Continue Amiodarone 200mg  daily. -Consider additional Metoprolol tartrate 12.5 mg as needed for symptomatic tachycardia. -Labs today: CBC, comprehensive metabolic panel, and TSH to monitor for Amiodarone toxicity. -Encouraged annual eye exams.  (HFpEF) heart failure with preserved ejection fraction (HCC) Two hospitalizations several mos ago for decompensated heart failure in the setting of AFib with RVR.  Volume status stable. She has some leg edema that is  non-pitting and dependent. This is likely due to venous insufficiency. Overall volume status seems stable. She is NYHA IIb. -Continue Furosemide 20mg  MWF and Spironolactone 12.5mg  daily.  -Recommend she take extra Furosemide 20 mg for weight gain > 3 lbs in 1 day. -CMET today -Follow up 6 mos.  Ascending aortic aneurysm (HCC) Stable at 46mm on most recent CT in December 2023. -Plan for repeat CT in December 2024.  Coronary artery calcification seen on CT scan Coronary calcification noted on CT scan in December 2023, managed medically per patient preference.  Troponins were elevated in December 2023 in the setting of atrial fibrillation with rapid rate.  The seem to be consistent with non-ST elevation myocardial infarction versus demand ischemia.  She preferred conservative management and did not undergo cardiac catheterization. -Continue metoprolol tartrate 12.5 mg twice daily -Follow-up 6 months  Essential hypertension Blood pressure controlled.  Continue hydralazine 10 mg 3 times a day, metoprolol tartrate 25 mg twice daily, spironolactone 12.5 mg daily.  Mitral regurgitation Mild mitral regurgitation on most recent echocardiogram in December 2023.  Lung nodule 7 mm  nodule on CT in December 2023.  As noted, she will have a repeat CT in December 2024 for ongoing surveillance of thoracic aortic aneurysm.       Dispo:  Return in about 6 months (around 03/29/2023) for Routine Follow Up w/ Dr. Mayford Knife.  Signed, Tereso Newcomer, PA-C

## 2022-09-26 NOTE — Patient Instructions (Signed)
Medication Instructions:  Your physician recommends that you continue on your current medications as directed. Please refer to the Current Medication list given to you today.  *If you need a refill on your cardiac medications before your next appointment, please call your pharmacy*   Lab Work: TODAY:  CMET, CBC, & TSH  If you have labs (blood work) drawn today and your tests are completely normal, you will receive your results only by: MyChart Message (if you have MyChart) OR A paper copy in the mail If you have any lab test that is abnormal or we need to change your treatment, we will call you to review the results.   Testing/Procedures: Your physician recommends you have a Chest CT Aorta in December 2024.   Follow-Up: At [facility], you and your health needs are our priority.  As part of our continuing mission to provide you with exceptional heart care, we have created designated Provider Care Teams.  These Care Teams include your primary Cardiologist (physician) and Advanced Practice Providers (APPs -  Physician Assistants and Nurse Practitioners) who all work together to provide you with the care you need, when you need it.  We recommend signing up for the patient portal called "MyChart".  Sign up information is provided on this After Visit Summary.  MyChart is used to connect with patients for Virtual Visits (Telemedicine).  Patients are able to view lab/test results, encounter notes, upcoming appointments, etc.  Non-urgent messages can be sent to your provider as well.   To learn more about what you can do with MyChart, go to [email or web address].    Your next appointment:   6 month(s)  Provider:   Armanda Magic, MD     Other Instructions

## 2022-09-26 NOTE — Assessment & Plan Note (Signed)
Previous hospitalizations in December 2023 and January 2024 for atrial fibrillation.  She is currently in sinus rhythm on Amiodarone 200mg  daily. She notes palpitations with increased stress only. She did have a few episodes of ATach on monitor in July 2024.  -Continue Amiodarone 200mg  daily. -Consider additional Metoprolol tartrate 12.5 mg as needed for symptomatic tachycardia. -Labs today: CBC, comprehensive metabolic panel, and TSH to monitor for Amiodarone toxicity. -Encouraged annual eye exams.

## 2022-09-26 NOTE — Assessment & Plan Note (Signed)
Blood pressure controlled.  Continue hydralazine 10 mg 3 times a day, metoprolol tartrate 25 mg twice daily, spironolactone 12.5 mg daily.

## 2022-09-26 NOTE — Assessment & Plan Note (Signed)
7 mm nodule on CT in December 2023.  As noted, she will have a repeat CT in December 2024 for ongoing surveillance of thoracic aortic aneurysm.

## 2022-09-26 NOTE — Assessment & Plan Note (Signed)
Two hospitalizations several mos ago for decompensated heart failure in the setting of AFib with RVR.  Volume status stable. She has some leg edema that is non-pitting and dependent. This is likely due to venous insufficiency. Overall volume status seems stable. She is NYHA IIb. -Continue Furosemide 20mg  MWF and Spironolactone 12.5mg  daily.  -Recommend she take extra Furosemide 20 mg for weight gain > 3 lbs in 1 day. -CMET today -Follow up 6 mos.

## 2022-09-27 ENCOUNTER — Telehealth: Payer: Self-pay | Admitting: *Deleted

## 2022-09-27 DIAGNOSIS — Z79899 Other long term (current) drug therapy: Secondary | ICD-10-CM

## 2022-09-27 LAB — CBC
Hematocrit: 40.7 % (ref 34.0–46.6)
Hemoglobin: 12.7 g/dL (ref 11.1–15.9)
MCH: 25.4 pg — ABNORMAL LOW (ref 26.6–33.0)
MCHC: 31.2 g/dL — ABNORMAL LOW (ref 31.5–35.7)
MCV: 81 fL (ref 79–97)
Platelets: 263 10*3/uL (ref 150–450)
RBC: 5 x10E6/uL (ref 3.77–5.28)
RDW: 14.7 % (ref 11.7–15.4)
WBC: 10 10*3/uL (ref 3.4–10.8)

## 2022-09-27 LAB — COMPREHENSIVE METABOLIC PANEL
ALT: 13 IU/L (ref 0–32)
AST: 16 IU/L (ref 0–40)
Albumin: 4.4 g/dL (ref 3.7–4.7)
Alkaline Phosphatase: 77 IU/L (ref 44–121)
BUN/Creatinine Ratio: 44 — ABNORMAL HIGH (ref 12–28)
BUN: 44 mg/dL — ABNORMAL HIGH (ref 8–27)
Bilirubin Total: <0.2 mg/dL (ref 0.0–1.2)
CO2: 23 mmol/L (ref 20–29)
Calcium: 9.9 mg/dL (ref 8.7–10.3)
Chloride: 104 mmol/L (ref 96–106)
Creatinine, Ser: 0.99 mg/dL (ref 0.57–1.00)
Globulin, Total: 2.9 g/dL (ref 1.5–4.5)
Glucose: 129 mg/dL — ABNORMAL HIGH (ref 70–99)
Potassium: 4.8 mmol/L (ref 3.5–5.2)
Sodium: 141 mmol/L (ref 134–144)
Total Protein: 7.3 g/dL (ref 6.0–8.5)
eGFR: 55 mL/min/{1.73_m2} — ABNORMAL LOW (ref >59)

## 2022-09-27 LAB — TSH: TSH: 0.907 u[IU]/mL (ref 0.450–4.500)

## 2022-09-27 MED ORDER — SPIRONOLACTONE 25 MG PO TABS: 12.5000 mg | ORAL_TABLET | ORAL | Status: DC

## 2022-09-27 NOTE — Telephone Encounter (Signed)
-----   Message from [place] sent at 09/27/2022 11:45 AM EDT ----- Creatinine (kidney function) normal.  However, BUN increased which suggests dehydration.  Potassium normal.  Liver enzymes normal.  Hemoglobin normal.  TSH normal. PLAN:  -Decrease spironolactone to 12.5 mg every Monday, Wednesday, Friday -Call if she has to take as needed Lasix more often after decreasing Spironolactone  -BMET 1 week Tereso Newcomer, PA-C    09/27/2022 11:43 AM

## 2022-10-04 ENCOUNTER — Ambulatory Visit: Payer: Medicare PPO | Attending: Physician Assistant

## 2022-10-04 DIAGNOSIS — Z79899 Other long term (current) drug therapy: Secondary | ICD-10-CM

## 2022-10-05 LAB — BASIC METABOLIC PANEL
BUN/Creatinine Ratio: 37 — ABNORMAL HIGH (ref 12–28)
BUN: 37 mg/dL — ABNORMAL HIGH (ref 8–27)
CO2: 21 mmol/L (ref 20–29)
Calcium: 9.6 mg/dL (ref 8.7–10.3)
Chloride: 103 mmol/L (ref 96–106)
Creatinine, Ser: 0.99 mg/dL (ref 0.57–1.00)
Glucose: 193 mg/dL — ABNORMAL HIGH (ref 70–99)
Potassium: 4.5 mmol/L (ref 3.5–5.2)
Sodium: 140 mmol/L (ref 134–144)
eGFR: 55 mL/min/{1.73_m2} — ABNORMAL LOW (ref >59)

## 2022-10-06 NOTE — Progress Notes (Signed)
Pt has been made aware of normal result and verbalized understanding.  jw

## 2022-10-20 DIAGNOSIS — E785 Hyperlipidemia, unspecified: Secondary | ICD-10-CM | POA: Diagnosis not present

## 2022-10-20 DIAGNOSIS — F411 Generalized anxiety disorder: Secondary | ICD-10-CM | POA: Diagnosis not present

## 2022-10-20 DIAGNOSIS — E11630 Type 2 diabetes mellitus with periodontal disease: Secondary | ICD-10-CM | POA: Diagnosis not present

## 2022-10-20 DIAGNOSIS — D6869 Other thrombophilia: Secondary | ICD-10-CM | POA: Diagnosis not present

## 2022-10-20 DIAGNOSIS — I700 Atherosclerosis of aorta: Secondary | ICD-10-CM | POA: Diagnosis not present

## 2022-10-20 DIAGNOSIS — R269 Unspecified abnormalities of gait and mobility: Secondary | ICD-10-CM | POA: Diagnosis not present

## 2022-10-20 DIAGNOSIS — E1151 Type 2 diabetes mellitus with diabetic peripheral angiopathy without gangrene: Secondary | ICD-10-CM | POA: Diagnosis not present

## 2022-10-20 DIAGNOSIS — I129 Hypertensive chronic kidney disease with stage 1 through stage 4 chronic kidney disease, or unspecified chronic kidney disease: Secondary | ICD-10-CM | POA: Diagnosis not present

## 2022-10-20 DIAGNOSIS — K219 Gastro-esophageal reflux disease without esophagitis: Secondary | ICD-10-CM | POA: Diagnosis not present

## 2022-10-20 DIAGNOSIS — K5900 Constipation, unspecified: Secondary | ICD-10-CM | POA: Diagnosis not present

## 2022-10-20 DIAGNOSIS — Z8249 Family history of ischemic heart disease and other diseases of the circulatory system: Secondary | ICD-10-CM | POA: Diagnosis not present

## 2022-10-20 DIAGNOSIS — H9193 Unspecified hearing loss, bilateral: Secondary | ICD-10-CM | POA: Diagnosis not present

## 2022-10-20 DIAGNOSIS — J302 Other seasonal allergic rhinitis: Secondary | ICD-10-CM | POA: Diagnosis not present

## 2022-10-20 DIAGNOSIS — I252 Old myocardial infarction: Secondary | ICD-10-CM | POA: Diagnosis not present

## 2022-10-20 DIAGNOSIS — R001 Bradycardia, unspecified: Secondary | ICD-10-CM | POA: Diagnosis not present

## 2022-10-20 DIAGNOSIS — G250 Essential tremor: Secondary | ICD-10-CM | POA: Diagnosis not present

## 2022-10-20 DIAGNOSIS — E1122 Type 2 diabetes mellitus with diabetic chronic kidney disease: Secondary | ICD-10-CM | POA: Diagnosis not present

## 2022-10-20 DIAGNOSIS — R32 Unspecified urinary incontinence: Secondary | ICD-10-CM | POA: Diagnosis not present

## 2022-11-01 ENCOUNTER — Ambulatory Visit: Payer: Medicare PPO | Admitting: Neurology

## 2022-11-01 ENCOUNTER — Encounter: Payer: Self-pay | Admitting: Neurology

## 2022-11-04 DIAGNOSIS — N1831 Chronic kidney disease, stage 3a: Secondary | ICD-10-CM | POA: Diagnosis not present

## 2022-11-04 DIAGNOSIS — E1140 Type 2 diabetes mellitus with diabetic neuropathy, unspecified: Secondary | ICD-10-CM | POA: Diagnosis not present

## 2022-11-04 DIAGNOSIS — D6869 Other thrombophilia: Secondary | ICD-10-CM | POA: Diagnosis not present

## 2022-11-04 DIAGNOSIS — I480 Paroxysmal atrial fibrillation: Secondary | ICD-10-CM | POA: Diagnosis not present

## 2022-11-04 DIAGNOSIS — I130 Hypertensive heart and chronic kidney disease with heart failure and stage 1 through stage 4 chronic kidney disease, or unspecified chronic kidney disease: Secondary | ICD-10-CM | POA: Diagnosis not present

## 2022-11-04 DIAGNOSIS — E559 Vitamin D deficiency, unspecified: Secondary | ICD-10-CM | POA: Diagnosis not present

## 2022-11-04 DIAGNOSIS — E782 Mixed hyperlipidemia: Secondary | ICD-10-CM | POA: Diagnosis not present

## 2022-11-04 DIAGNOSIS — Z23 Encounter for immunization: Secondary | ICD-10-CM | POA: Diagnosis not present

## 2022-11-04 DIAGNOSIS — E1142 Type 2 diabetes mellitus with diabetic polyneuropathy: Secondary | ICD-10-CM | POA: Diagnosis not present

## 2022-11-04 DIAGNOSIS — E1122 Type 2 diabetes mellitus with diabetic chronic kidney disease: Secondary | ICD-10-CM | POA: Diagnosis not present

## 2022-11-04 DIAGNOSIS — I714 Abdominal aortic aneurysm, without rupture, unspecified: Secondary | ICD-10-CM | POA: Diagnosis not present

## 2022-12-03 ENCOUNTER — Other Ambulatory Visit: Payer: Self-pay | Admitting: Cardiology

## 2022-12-04 ENCOUNTER — Other Ambulatory Visit: Payer: Self-pay | Admitting: Cardiology

## 2022-12-05 ENCOUNTER — Telehealth: Payer: Self-pay | Admitting: Cardiology

## 2022-12-05 ENCOUNTER — Ambulatory Visit: Payer: Medicare PPO | Admitting: Podiatry

## 2022-12-05 DIAGNOSIS — I480 Paroxysmal atrial fibrillation: Secondary | ICD-10-CM

## 2022-12-05 MED ORDER — APIXABAN 5 MG PO TABS: 5.0000 mg | ORAL_TABLET | Freq: Two times a day (BID) | ORAL | 1 refills | Status: AC

## 2022-12-05 NOTE — Telephone Encounter (Signed)
Prescription refill request for Eliquis received. Indication: Afib  Last office visit: 09/26/22 Alben Spittle)  Scr: 0.99 (10/04/22)  Age: 89 Weight: 68.8kg  Appropriate dose. Refill sent.

## 2022-12-05 NOTE — Telephone Encounter (Signed)
*  STAT* If patient is at the pharmacy, call can be transferred to refill team.   1. Which medications need to be refilled? (please list name of each medication and dose if known) ELIQUIS 5 MG TABS tablet  2. Which pharmacy/location (including street and city if local pharmacy) is medication to be sent to?[id number] - [place], [place] - [address] AT [address]  3. Do they need a 30 day or 90 day supply?  90 day supply  Patient's daughter states the patient is completely out of medication.

## 2023-01-16 ENCOUNTER — Ambulatory Visit: Payer: Medicare PPO | Admitting: Podiatry

## 2023-01-23 ENCOUNTER — Telehealth (HOSPITAL_BASED_OUTPATIENT_CLINIC_OR_DEPARTMENT_OTHER): Payer: Self-pay | Admitting: Physician Assistant

## 2023-01-23 ENCOUNTER — Ambulatory Visit (HOSPITAL_BASED_OUTPATIENT_CLINIC_OR_DEPARTMENT_OTHER): Payer: Medicare PPO

## 2023-02-14 ENCOUNTER — Emergency Department (HOSPITAL_COMMUNITY): Payer: Medicare PPO

## 2023-02-14 ENCOUNTER — Encounter (HOSPITAL_COMMUNITY): Payer: Self-pay

## 2023-02-14 ENCOUNTER — Other Ambulatory Visit: Payer: Self-pay

## 2023-02-14 ENCOUNTER — Emergency Department (HOSPITAL_COMMUNITY)
Admission: EM | Admit: 2023-02-14 | Discharge: 2023-02-14 | Disposition: A | Discharge status: Home / Self Care | Payer: Medicare PPO | Attending: Emergency Medicine | Admitting: Emergency Medicine

## 2023-02-14 DIAGNOSIS — R7989 Other specified abnormal findings of blood chemistry: Secondary | ICD-10-CM | POA: Insufficient documentation

## 2023-02-14 DIAGNOSIS — R079 Chest pain, unspecified: Secondary | ICD-10-CM | POA: Diagnosis not present

## 2023-02-14 DIAGNOSIS — Z20822 Contact with and (suspected) exposure to covid-19: Secondary | ICD-10-CM | POA: Diagnosis not present

## 2023-02-14 DIAGNOSIS — Z7901 Long term (current) use of anticoagulants: Secondary | ICD-10-CM | POA: Diagnosis not present

## 2023-02-14 DIAGNOSIS — I2510 Atherosclerotic heart disease of native coronary artery without angina pectoris: Secondary | ICD-10-CM | POA: Diagnosis not present

## 2023-02-14 DIAGNOSIS — I509 Heart failure, unspecified: Secondary | ICD-10-CM | POA: Insufficient documentation

## 2023-02-14 DIAGNOSIS — I4891 Unspecified atrial fibrillation: Secondary | ICD-10-CM | POA: Insufficient documentation

## 2023-02-14 DIAGNOSIS — R509 Fever, unspecified: Secondary | ICD-10-CM | POA: Diagnosis not present

## 2023-02-14 DIAGNOSIS — Z79899 Other long term (current) drug therapy: Secondary | ICD-10-CM | POA: Insufficient documentation

## 2023-02-14 DIAGNOSIS — I110 Hypertensive heart disease with heart failure: Secondary | ICD-10-CM | POA: Diagnosis not present

## 2023-02-14 DIAGNOSIS — Z7401 Bed confinement status: Secondary | ICD-10-CM | POA: Diagnosis not present

## 2023-02-14 DIAGNOSIS — Z794 Long term (current) use of insulin: Secondary | ICD-10-CM | POA: Diagnosis not present

## 2023-02-14 DIAGNOSIS — I959 Hypotension, unspecified: Secondary | ICD-10-CM | POA: Diagnosis not present

## 2023-02-14 DIAGNOSIS — R739 Hyperglycemia, unspecified: Secondary | ICD-10-CM | POA: Diagnosis not present

## 2023-02-14 DIAGNOSIS — R0602 Shortness of breath: Secondary | ICD-10-CM | POA: Diagnosis not present

## 2023-02-14 DIAGNOSIS — I517 Cardiomegaly: Secondary | ICD-10-CM | POA: Diagnosis not present

## 2023-02-14 DIAGNOSIS — I7121 Aneurysm of the ascending aorta, without rupture: Secondary | ICD-10-CM | POA: Diagnosis not present

## 2023-02-14 DIAGNOSIS — I480 Paroxysmal atrial fibrillation: Secondary | ICD-10-CM | POA: Diagnosis not present

## 2023-02-14 DIAGNOSIS — I499 Cardiac arrhythmia, unspecified: Secondary | ICD-10-CM | POA: Diagnosis not present

## 2023-02-14 DIAGNOSIS — E119 Type 2 diabetes mellitus without complications: Secondary | ICD-10-CM | POA: Insufficient documentation

## 2023-02-14 DIAGNOSIS — I700 Atherosclerosis of aorta: Secondary | ICD-10-CM | POA: Diagnosis not present

## 2023-02-14 DIAGNOSIS — R531 Weakness: Secondary | ICD-10-CM | POA: Diagnosis not present

## 2023-02-14 DIAGNOSIS — R000 Tachycardia, unspecified: Secondary | ICD-10-CM | POA: Diagnosis not present

## 2023-02-14 LAB — HEPATIC FUNCTION PANEL
ALT: 15 U/L (ref 0–44)
AST: 29 U/L (ref 15–41)
Albumin: 3.2 g/dL — ABNORMAL LOW (ref 3.5–5.0)
Alkaline Phosphatase: 58 U/L (ref 38–126)
Bilirubin, Direct: 0.3 mg/dL — ABNORMAL HIGH (ref 0.0–0.2)
Indirect Bilirubin: 0.7 mg/dL (ref 0.3–0.9)
Total Bilirubin: 1 mg/dL (ref 0.0–1.2)
Total Protein: 6.4 g/dL — ABNORMAL LOW (ref 6.5–8.1)

## 2023-02-14 LAB — RESP PANEL BY RT-PCR (RSV, FLU A&B, COVID)  RVPGX2
Influenza A by PCR: NEGATIVE
Influenza B by PCR: NEGATIVE
Resp Syncytial Virus by PCR: NEGATIVE
SARS Coronavirus 2 by RT PCR: NEGATIVE

## 2023-02-14 LAB — MAGNESIUM: Magnesium: 1.7 mg/dL (ref 1.7–2.4)

## 2023-02-14 LAB — BASIC METABOLIC PANEL
Anion gap: 11 (ref 5–15)
BUN: 35 mg/dL — ABNORMAL HIGH (ref 8–23)
CO2: 22 mmol/L (ref 22–32)
Calcium: 9.1 mg/dL (ref 8.9–10.3)
Chloride: 108 mmol/L (ref 98–111)
Creatinine, Ser: 1.02 mg/dL — ABNORMAL HIGH (ref 0.44–1.00)
GFR, Estimated: 53 mL/min — ABNORMAL LOW (ref >60)
Glucose, Bld: 240 mg/dL — ABNORMAL HIGH (ref 70–99)
Potassium: 5 mmol/L (ref 3.5–5.1)
Sodium: 141 mmol/L (ref 135–145)

## 2023-02-14 LAB — TROPONIN I (HIGH SENSITIVITY)
Troponin I (High Sensitivity): 30 ng/L — ABNORMAL HIGH (ref <18)
Troponin I (High Sensitivity): 29 ng/L — ABNORMAL HIGH (ref <18)

## 2023-02-14 LAB — TSH: TSH: 1.135 u[IU]/mL (ref 0.350–4.500)

## 2023-02-14 LAB — BRAIN NATRIURETIC PEPTIDE: B Natriuretic Peptide: 683.8 pg/mL — ABNORMAL HIGH (ref 0.0–100.0)

## 2023-02-14 MED ORDER — DILTIAZEM HCL-DEXTROSE 125-5 MG/125ML-% IV SOLN (PREMIX): 5.0000 mg/h | INTRAVENOUS | Status: DC

## 2023-02-14 MED ORDER — METOPROLOL TARTRATE 25 MG PO TABS
12.5000 mg | ORAL_TABLET | Freq: Once | ORAL | Status: AC
  Administered 2023-02-14: 12.5 mg via ORAL
  Filled 2023-02-14: qty 1

## 2023-02-14 MED ORDER — AMIODARONE HCL IN DEXTROSE 360-4.14 MG/200ML-% IV SOLN
60.0000 mg/h | INTRAVENOUS | Status: AC
  Administered 2023-02-14: 60 mg/h via INTRAVENOUS
  Filled 2023-02-14: qty 200

## 2023-02-14 MED ORDER — AMIODARONE HCL IN DEXTROSE 360-4.14 MG/200ML-% IV SOLN
30.0000 mg/h | INTRAVENOUS | Status: DC
  Administered 2023-02-14: 30 mg/h via INTRAVENOUS

## 2023-02-14 MED ORDER — DILTIAZEM LOAD VIA INFUSION: 10.0000 mg | Freq: Once | INTRAVENOUS | Status: DC

## 2023-02-14 MED ORDER — METOPROLOL TARTRATE 5 MG/5ML IV SOLN
5.0000 mg | Freq: Once | INTRAVENOUS | Status: AC
  Administered 2023-02-14: 5 mg via INTRAVENOUS
  Filled 2023-02-14: qty 5

## 2023-02-14 NOTE — Consult Note (Addendum)
 Cardiology Consultation   Patient ID: [name] MRN: [id number]; DOB: 09-26-1932  Admit date: 02/14/2023 Date of Consult: 02/14/2023  PCP:  Seabron Lenis, MD   [facility] Providers Cardiologist:  Wilbert Bihari, MD   {  Patient Profile:   [name] is a 89 y.o. female with a hx of HFpEF, paroxysmal atrial fibrillation on Eliquis  , CAD, mitral regurgitation, diabetes, and thoracic aortic aneurysm who is being seen 02/14/2023 for the evaluation of atrial fibrillation with RVR at the request of Dr. Melvenia.  History of Present Illness:   Ms. Hook is an 89yo female with a history of HFpEF, paroxysmal atrial fibrillation on Eliquis , CAD, mitral regurgitation, diabetes, and thoracic aortic aneurysm. She presented to the [facility] ED via EMS today, 02/14/23, with chief complaints of palpitations and chest pains. Field EKG revealed atrial fibrillation with RVR, rates in the 150s PTA. She was given 500 cc IVF which seems to improve her rapid HR. In the ED patient denied any areas of pain and only complains of fatigue.   She was previously admitted in December 2023 and January 2024. In December 2023 she was admitted for atrial fibrillation with rapid HR at which time she spontaneously converted back to NSR. Her troponins in Dec. 2023 were 132>2,092>3,162>2,858 but she opted for conservative management and did not want an invasive ischemic workup. In January 2024 she was admitted fro gastroenteritis which was complicated by recurrent atrial fibrillation and decompensated heart failure. At this time she was started on amiodarone  which restored NSR.  She is a patient of Dr. Bihari and was last seen by Glendia Ferrier, PA-C on 09/26/22 for an outpatient follow up. At this visit she was in sinus rhythm. Her current regimen was amiodarone  200 mg daily and Lopressor  12.5 mg BID.  Patient reported that she only get palpitations when she has increased stress. She was taking Eliquis  5 mg BID for  anticoagulation.  In regards to her heart failure, it was likely in the setting of AFib with RVR and her volume status seemed stable at her last visit. She was currently on Lasix  20 mg MWF and spironolactone  12.5 mg daily. She was also recommended to take an additional 20 mg Lasix  if she had a weight gain of over 3 lb in 1 day. Her recently checked BMET was stable. For her hypertension, she was well controlled taking the previously mentioned Lopressor  and spironolactone  as well as hydralazine  10 mg TID.   After seeing the patient she reports that she does not currently have any chest pain or shortness of breath. She only remains to feel fatigued. She reported that she took her HR this morning and it was 113. She said she took all of her morning medications. She has been taking her weight daily and has not needed to take her PRN Lasix  because her weight was never changed by more than 1-2 lbs. She has chronic LE edema that is unchanged from her baseline. She denies any recent illnesses or known sick contacts. She says that she has been having added stress recently that has been accumulating as she is taking care of her 92yo husband.   Past Medical History:  Diagnosis Date   Abdominal pain, chronic, left lower quadrant    Acute torn meniscus of knee    Adenomatous colon polyp 1970   carcinoma in situ   Allergic rhinitis    Amaurosis fugax 08/07/2012   Anxiety    Ascending aortic aneurysm (HCC) 12/07/2015  44mm by chest CTA 08/2020   Benign essential tremor syndrome    Bicuspid aortic valve    no AS on 07/2019   Carotid stenosis    1039 bilateral by dopplers 03/2017.    colon ca dx'd 1970   surg only   Coronary artery calcification seen on CT scan 09/26/2022   - Cath in 2002 normal - Myoview in 2017 low risk - Admx in 12/2021 w AF w RVR and elevated hsT c/w NSTEMI - pt preferred conservative mgmt (no cath)    DDD (degenerative disc disease)    Diverticulitis    Diverticulosis    DM  (diabetes mellitus) (HCC)    Duodenitis    peptic, with gastric heterotopia   Endometriosis    s/p hysterectomy   Fundic gland polyposis of stomach    GERD (gastroesophageal reflux disease)    Heart murmur    Hiatal hernia 02/03/2005   History of cerebral aneurysm repair    s/p coiling   History of hemorrhoids    with bleeding   History of shingles    HLD (hyperlipidemia)    HTN (hypertension)    Hypercholesteremia    IBS (irritable bowel syndrome)    Iron deficiency    Lung nodule 07/12/2021   CT 05/2021: 3 mm right solid pulmonary nodule-no routine follow-up imaging recommended   Migraine    MVP (mitral valve prolapse)    Osteopenia    Peripheral neuropathy    Toe fracture, right    second toe   UTI (lower urinary tract infection)    Varicose vein    Past Surgical History:  Procedure Laterality Date   ABDOMINAL HYSTERECTOMY     APPENDECTOMY     CARPAL TUNNEL RELEASE  08/26/07   CATARACT EXTRACTION     CEREBRAL ANEURYSM REPAIR     COLON RESECTION     COLONOSCOPY  06/07/08   divertiulosis, internal hemorrhoids   COLONOSCOPY W/ BIOPSIES AND POLYPECTOMY  02/03/05   diverticulosis, 4 mm sessile polyps, internal and external hemorrhoids   CORONARY ANGIOPLASTY WITH STENT PLACEMENT     ESOPHAGOGASTRODUODENOSCOPY  02/03/05   hiatal hernia, 6 benign gastric polyps   FLEXIBLE SIGMOIDOSCOPY     HAND SURGERY Right    INTRAOCULAR LENS INSERTION     right hand decompressive fasciotomy  08/26/07   , dorsal and volar   TONSILLECTOMY      Home Medications:  Prior to Admission medications   Medication Sig Start Date End Date Taking? Authorizing Provider  apixaban  (ELIQUIS ) 5 MG TABS tablet Take 1 tablet (5 mg total) by mouth 2 (two) times daily. 12/05/22  Yes Turner, Wilbert SAUNDERS, MD  acetaminophen  (TYLENOL ) 500 MG tablet Take 500 mg by mouth every 6 (six) hours as needed for mild pain, moderate pain, fever or headache.    [provider]  ALPRAZolam  (XANAX ) 0.5 MG tablet Take 1-2  tablets (0.5-1 mg total) by mouth 2 (two) times daily as needed for anxiety. 02/18/22   Krishnan, Gokul, MD  amiodarone  (PACERONE ) 200 MG tablet Take 200mg  twice daily until 02/23/22 and then take 200mg  once daily 02/18/22   Krishnan, Gokul, MD  furosemide  (LASIX ) 20 MG tablet TAKE ONE TAB EVERY MON, WED, FRI. TAKE ONE ADDITIONAL TAB IF WEIGHT GAIN OF 3+ LBS OVERNIGHT 12/05/22   Turner, Wilbert SAUNDERS, MD  hydrALAZINE  (APRESOLINE ) 10 MG tablet TAKE 1 TABLET BY MOUTH THREE TIMES A DAY 08/22/22   Shlomo Wilbert SAUNDERS, MD  hydrocortisone  (ANUSOL -HC) 2.5 % rectal cream  Apply to perianal area 3 times daily. 11/14/17   Zehr, Jessica D, PA-C  insulin  glargine (LANTUS ) 100 UNIT/ML Solostar Pen Inject 5 Units into the skin in the morning. 02/18/22   Krishnan, Gokul, MD  metoprolol  tartrate (LOPRESSOR ) 25 MG tablet Take 0.5 tablets (12.5 mg total) by mouth 2 (two) times daily. 02/18/22   Krishnan, Gokul, MD  Multiple Vitamin (MULTIVITAMIN) tablet Take 1 tablet by mouth in the morning.    [provider]  ondansetron  (ZOFRAN ) 4 MG tablet Take 1 tablet (4 mg total) by mouth every 8 (eight) hours as needed for nausea or vomiting. 02/18/22   Krishnan, Gokul, MD  pantoprazole  (PROTONIX ) 40 MG tablet TAKE 1 TABLET BY MOUTH EVERY DAY BEFORE BREAKFAST 02/03/20   Avram Lupita BRAVO, MD  polyethylene glycol (MIRALAX  / GLYCOLAX ) 17 g packet Take 17 g by mouth 2 (two) times daily. 02/18/22   Krishnan, Gokul, MD  spironolactone  (ALDACTONE ) 25 MG tablet TAKE 1/2 TABLET BY MOUTH DAILY 12/05/22   Shlomo Wilbert SAUNDERS, MD   Inpatient Medications: Scheduled Meds:  Continuous Infusions:  amiodarone  30 mg/hr (02/14/23 1549)   PRN Meds:  Allergies:    Allergies  Allergen Reactions   Actos [Pioglitazone] Other (See Comments)    Chronic pedal edema   Amoxil  [Amoxicillin ] Other (See Comments)    Stomach upset   Avandia [Rosiglitazone] Other (See Comments)    Chronic pedal edema   Fosamax [Alendronate Sodium] Other (See Comments)    Unknown  reaction   Ms Contin  [Morphine ] Other (See Comments)    Body spasms    Ultram  [Tramadol ] Shortness Of Breath and Palpitations   Amaryl [Glimepiride] Other (See Comments)    Unknown reaction   Januvia [Sitagliptin] Other (See Comments)    Unknown reaction   Lipitor [Atorvastatin] Swelling    Myalgias    Metformin And Related Other (See Comments)    Gastritis    Prandin [Repaglinide] Other (See Comments)    Abdominal pain   Tradjenta [Linagliptin] Other (See Comments)    GI issues   Zoloft [Sertraline Hcl] Other (See Comments)    Altered mental state   Asa [Aspirin ] Other (See Comments)    Bleeding ulcers    Bactrim [Sulfamethoxazole-Trimethoprim] Hives   Bentyl [Dicyclomine Hcl] Other (See Comments)    Near syncope Weakness    Cipro  [Ciprofloxacin  Hcl] Nausea And Vomiting and Other (See Comments)    Stomach upset   Hctz [Hydrochlorothiazide ] Other (See Comments)    Urinary issues   Keflex [Cephalexin] Other (See Comments)    Unknown reaction   Morphine  And Codeine Other (See Comments)    Body spasms   Norvasc  [Amlodipine  Besylate] Other (See Comments)    Weakness Dizziness    Oxycontin [Oxycodone] Other (See Comments)    Unknown reaction   Sulfa Antibiotics Hives   Penicillins Swelling and Rash   Social History:   Social History   Socioeconomic History   Marital status: Married    Spouse name: Charles   Number of children: 0   Years of education: Not on file   Highest education level: Not on file  Occupational History   Occupation: retired    Associate Professor: RETIRED  Tobacco Use   Smoking status: Never   Smokeless tobacco: Never  Vaping Use   Vaping status: Never Used  Substance and Sexual Activity   Alcohol use: No   Drug use: No   Sexual activity: Not on file  Other Topics Concern   Not on file  Social  History Narrative   Lives with husband    Right handed   Caffeine: 2 c of coffee a day   Social Drivers of Corporate Investment Banker Strain: Not on  file  Food Insecurity: No Food Insecurity (02/05/2022)   Hunger Vital Sign    Worried About Running Out of Food in the Last Year: Never true    Ran Out of Food in the Last Year: Never true  Transportation Needs: No Transportation Needs (02/05/2022)   PRAPARE - Administrator, Civil Service (Medical): No    Lack of Transportation (Non-Medical): No  Physical Activity: Not on file  Stress: Not on file  Social Connections: Not on file  Intimate Partner Violence: Not At Risk (02/05/2022)   Humiliation, Afraid, Rape, and Kick questionnaire    Fear of Current or Ex-Partner: No    Emotionally Abused: No    Physically Abused: No    Sexually Abused: No    Family History:   Family History  Problem Relation Age of Onset   Ovarian cancer Mother    Heart disease Father    Kidney disease Father    Diabetes Paternal Grandmother    Diabetes Brother    Hypertension Brother    Heart disease Brother    Diabetes Brother    Heart disease Brother    Microcephaly Brother    Diabetes Other    Colon cancer Other     ROS:  Please see the history of present illness.  All other ROS reviewed and negative.     Physical Exam/Data:   Vitals:   02/14/23 1415 02/14/23 1530 02/14/23 1549 02/14/23 1645  BP: 119/70 124/83  133/60  Pulse: (!) 47 (!) 59  (!) 52  Resp: (!) 23 (!) 25  17  Temp:   97.6 F (36.4 C)   TempSrc:   Temporal   SpO2: 96% 96%  95%  Weight:      Height:       No intake or output data in the 24 hours ending 02/14/23 1701    02/14/2023   10:21 AM 09/26/2022    1:34 PM 02/18/2022    4:31 AM  Last 3 Weights  Weight (lbs) 153 lb 151 lb 9.6 oz 140 lb 3.2 oz  Weight (kg) 69.4 kg 68.765 kg 63.594 kg     Body mass index is 26.26 kg/m.  General: In no acute distress, resting in the bed  HEENT: normal Neck: no JVD Vascular: No carotid bruits; Distal pulses 2+ bilaterally Cardiac:  normal S1, S2; bradycardic, regular rhythm; no murmur  Lungs:  clear to auscultation  bilaterally Abd: soft, nontender Ext: bilateral LE edema similar to patient's baseline Musculoskeletal:  No deformities Skin: warm and dry  Neuro: no focal abnormalities noted Psych:  Normal affect   EKG:  The EKG was personally reviewed and demonstrates:  atrial fibrillation with rapid HR, 130 from 1/14 10:24 AM.  Updated EKG from 1/14 4:41 PM shows sinus bradycardia, HR 54  Relevant CV Studies: Echocardiogram 01/03/2022 IMPRESSIONS   1. Basal inferior hypokinesis/akinesis. Poor acoustic windows Definity   used.     Compared to echo from June 2023 basal inferior changes are more  prominent. Left ventricular ejection fraction, by estimation, is 55 to  60%. The left ventricle has normal function. Left ventricular diastolic  parameters are consistent with Grade I  diastolic dysfunction (impaired relaxation).   2. Right ventricular systolic function is low normal. The right  ventricular size is normal.  There is normal pulmonary artery systolic  pressure.   3. Left atrial size was mildly dilated.   4. Mild mitral valve regurgitation.   5. AV is mildly thickened, calcified There is very mildly restricted  motion of the valve . Peak and mean gradients through the valve are 11 and  5 mm Hg respectively. No signficant change when compared to echo from June  2023. [name] The aortic valve is  tricuspid.   6. There is mild dilatation of the ascending aorta, measuring 39 mm.   7. The inferior vena cava is normal in size with greater than 50%  respiratory variability, suggesting right atrial pressure of 3 mmHg.   FINDINGS   Left Ventricle: Basal inferior hypokinesis/akinesis. Poor acoustic  windows Definity  used.  Compared to echo from June 2023 basal inferior changes are more prominent.  Left ventricular ejection fraction, by estimation, is 55 to 60%. The left  ventricle has normal function. Definity  contrast agent was given IV to  delineate the left ventricular  endocardial borders. The left  ventricular internal cavity size was normal  in size. There is no left ventricular hypertrophy. Left ventricular  diastolic parameters are consistent with Grade I diastolic dysfunction  (impaired relaxation).   Right Ventricle: The right ventricular size is normal. Right vetricular  wall thickness was not assessed. Right ventricular systolic function is  low normal. There is normal pulmonary artery systolic pressure. The  tricuspid regurgitant velocity is 2.71  m/s, and with an assumed right atrial pressure of 3 mmHg, the estimated  right ventricular systolic pressure is 32.4 mmHg.   Left Atrium: Left atrial size was mildly dilated.   Right Atrium: Right atrial size was normal in size.   Pericardium: Trivial pericardial effusion is present.   Mitral Valve: There is mild thickening of the mitral valve leaflet(s).  Mild mitral valve regurgitation.   Tricuspid Valve: The tricuspid valve is normal in structure. Tricuspid  valve regurgitation is trivial.   Aortic Valve: AV is mildly thickened, calcified There is very mildly  restricted motion of the valve . Peak and mean gradients through the valve  are 11 and 5 mm Hg respectively. No signficant change when compared to  echo from June 2023. The aortic valve  is tricuspid. Aortic valve mean gradient measures 5.0 mmHg. Aortic valve  peak gradient measures 11.0 mmHg. Aortic valve area, by VTI measures 2.21  cm.   Pulmonic Valve: The pulmonic valve was normal in structure. Pulmonic valve  regurgitation is trivial.   Aorta: The aortic root is normal in size and structure. There is mild  dilatation of the ascending aorta, measuring 39 mm.   Venous: The inferior vena cava is normal in size with greater than 50%  respiratory variability, suggesting right atrial pressure of 3 mmHg.   IAS/Shunts: No atrial level shunt detected by color flow Doppler.   Laboratory Data:  High Sensitivity Troponin:   Recent Labs  Lab 02/14/23 1028  02/14/23 1354  TROPONINIHS 30* 29*     Chemistry Recent Labs  Lab 02/14/23 1028  NA 141  K 5.0  CL 108  CO2 22  GLUCOSE 240*  BUN 35*  CREATININE 1.02*  CALCIUM 9.1  MG 1.7  GFRNONAA 53*  ANIONGAP 11    Recent Labs  Lab 02/14/23 1028  PROT 6.4*  ALBUMIN 3.2*  AST 29  ALT 15  ALKPHOS 58  BILITOT 1.0   Lipids No results for input(s): CHOL, TRIG, HDL, LABVLDL, LDLCALC, CHOLHDL in the last  168 hours.  HematologyNo results for input(s): WBC, RBC, HGB, HCT, MCV, MCH, MCHC, RDW, PLT in the last 168 hours. Thyroid   Recent Labs  Lab 02/14/23 1034  TSH 1.135    BNP Recent Labs  Lab 02/14/23 1028  BNP 683.8*    DDimer No results for input(s): DDIMER in the last 168 hours.  Radiology/Studies:  CT Chest Wo Contrast Result Date: 02/14/2023 CLINICAL DATA:  Respiratory illness, nondiagnostic xray EXAM: CT CHEST WITHOUT CONTRAST TECHNIQUE: Multidetector CT imaging of the chest was performed following the standard protocol without IV contrast. RADIATION DOSE REDUCTION: This exam was performed according to the departmental dose-optimization program which includes automated exposure control, adjustment of the mA and/or kV according to patient size and/or use of iterative reconstruction technique. COMPARISON:  CT 01/02/2022 FINDINGS: Cardiovascular: Mild cardiomegaly. Trace pericardial fluid. Ascending thoracic aorta measures 4.6 x 4.4 cm in diameter, previously 4.6 cm. Aortic and coronary artery atherosclerosis. Central pulmonary vasculature is dilated. Mediastinum/Nodes: No enlarged mediastinal or axillary lymph nodes. Thyroid  gland, trachea, and esophagus demonstrate no significant findings. Lungs/Pleura: Previously seen nodules within the right upper lobe, lingula, and right lower lobe have resolved. No new pulmonary nodules. No focal airspace consolidation. No pleural effusion or pneumothorax. Upper Abdomen: Probable vascular calcification in the left  renal hilum. No acute abnormality within the included upper abdomen. Musculoskeletal: No new or acute osseous abnormality. No chest wall abnormality. IMPRESSION: 1. No acute intrathoracic findings. 2. Stable ascending thoracic aortic aneurysm measuring up to 4.6 cm. Recommend semi-annual imaging followup by CTA or MRA and referral to cardiothoracic surgery if not already obtained. This recommendation follows 2010 ACCF/AHA/AATS/ACR/ASA/SCA/SCAI/SIR/STS/SVM Guidelines for the Diagnosis and Management of Patients With Thoracic Aortic Disease. Circulation. 2010; 121: Z733-z630. Aortic aneurysm NOS (ICD10-I71.9) 3. Previously seen nodules within the right upper lobe, lingula, and right lower lobe have resolved, compatible with an infectious or inflammatory etiology. No new pulmonary nodules. 4. Mild cardiomegaly with dilated central pulmonary vasculature, suggestive of pulmonary arterial hypertension. 5. Aortic and coronary artery atherosclerosis (ICD10-I70.0). Electronically Signed   By: Mabel Converse D.O.   On: 02/14/2023 14:14   DG Chest 2 View Result Date: 02/14/2023 CLINICAL DATA:  Chest pain. EXAM: CHEST - 2 VIEW COMPARISON:  Chest radiograph dated February 03, 2022. FINDINGS: Patient is rotated to the left. Mild cardiomegaly, unchanged. Aortic atherosclerosis. No focal consolidation, pleural effusion, or pneumothorax. Overlapping telemetry wires. No acute osseous abnormality. IMPRESSION: Cardiomegaly.  No acute cardiopulmonary findings. Electronically Signed   By: Harrietta Sherry M.D.   On: 02/14/2023 10:51   Assessment and Plan:   Paroxysmal atrial fibrillation with RVR Field EKG demonstrated atrial fibrillation with RVR rates 150s  EKG in ED demonstrated atrial fibrillation with RVR HR 130 Patient has been previously hospitalized Dec 2023/Jan 2024 with AFib with RVR  PTA medications are amiodarone  200 mg daily, Lopressor  12.5 mg BID, Eliquis  5 mg BID for anticoagulation  -- Patient was given  Lopressor  5 mg IV + 12.5 mg PO and started on amiodarone  drip in the ED -- Updated EKG shows patient has successfully converted to sinus bradycardia, she has no alarming symptoms and is okay to discharge home from cardiology standpoint Patient will need help with social work to get transportation home  Follow up will be scheduled for patient with general cardiology  HFpEF Coronary artery calcification seen on CT scan Ascending aortic aneurysm  Echo from 12/2021 showed LVEF of 55-60% with basal inferior hypokinesis/ akinesis and grade 1 diastolic dysfunction  PTA medications are  Lasix  20 mg MWF, spironolactone  12.5 mg daily, with additional PRN Lasix  20 mg for weight gain of over 3 lb in 1 day  Troponins 30>29 BNP  683, compared to 970 1 yr ago  CXR showed: cardiomegaly. No acute cardiopulmonary findings  CT chest showed: stable ascending thoracic aortic aneurysm measuring up to 4.6 cm, resolution of previously noted pulmonary nodules, mild cardiomegaly with dilated central pulmonary vasculature, suggestive of pulmonary arterial hypertension, aortic and coronary artery atherosclerosis  Weight on admission: 153 lbs  Hypertension Most recent BP reading 124/83 PTA medications were Lopressor  12.5 mg BID, hydralazine  10 mg TID, spironolactone  25 mg daily  -- Patient has been given Lopressor  5 mg IV + 12.5 mg PO   Risk Assessment/Risk Scores:  New York  Heart Association (NYHA) Functional Class NYHA Class II  CHA2DS2-VASc Score = 7   This indicates a 11.2% annual risk of stroke. The patient's score is based upon: CHF History: 1 HTN History: 1 Diabetes History: 1 Stroke History: 0 Vascular Disease History: 1 Age Score: 2 Gender Score: 1       For questions or updates, please contact [facility] Please consult [email or web address] for contact info under   Signed, Waddell DELENA Donath, PA-C  02/14/2023 5:01 PM  ATTENDING ATTESTATION:  After conducting a review of all available  clinical information with the care team, interviewing the patient, and performing a physical exam, I agree with the findings and plan described in this note.   GEN: No acute distress.   HEENT:  MMM, no JVD, no scleral icterus Cardiac: Bradycardic, no murmurs, rubs, or gallops.  Respiratory: Clear to auscultation bilaterally. GI: Soft, nontender, non-distended  MS: No edema; No deformity. Neuro:  Nonfocal  Vasc:  +2 radial pulses; mild nonpitting edema  Patient is an 89 y.o. female with a hx of HFpEF, paroxysmal atrial fibrillation on Eliquis  , T2DM here with AF RVR.  She felt somewhat weak but did not notice palpitations.  Her evaluation here has been reassuring and she has converted to sinus bradycardia.  She now feels well.  OK to discharge from ED with close hospital follow up which we will arrange.  Lurena Red, MD Pager [phone]

## 2023-02-14 NOTE — ED Provider Notes (Signed)
 Patient signed to me by Dr. Melvenia pending cardiology recommendations.  Patient seen by cardiology recommend discharge home and the patient is in sinus rhythm.  Discussed with patient's daughter and informed her about patient's known thoracic aneurysm.  She is well aware of this.  Patient herself is resting in bed comfortably she is not on oxygen at this time.  Was sent home by ROME Dasie Faden, MD 02/14/23 1725

## 2023-02-14 NOTE — ED Triage Notes (Signed)
 Pt BIB EMS for afib RVR HR 190/ a- flutter. SHOB with exertion for the past week. Pt took metoprolol this morning. Axox4.

## 2023-02-14 NOTE — ED Notes (Signed)
 Patient transported to CT

## 2023-02-14 NOTE — ED Notes (Signed)
 Lollie Sails (Daughter) called asking for a update. [phone]

## 2023-02-14 NOTE — ED Notes (Signed)
 CCMD notified via telephone.

## 2023-02-14 NOTE — ED Provider Notes (Signed)
 [place] [facility] Provider Note   CSN: [id number] Arrival date & time: 02/14/23  1019     History  Chief Complaint  Patient presents with   Palpitations   Chest Pain    [name] is a 89 y.o. female.   Palpitations Associated symptoms: shortness of breath and weakness (Generalized)   Chest Pain Associated symptoms: fatigue, palpitations, shortness of breath and weakness (Generalized)   Patient presents for shortness of breath and fatigue.  Medical history includes HTN, aortic aneurysm, IBS, GERD, anxiety, DM, HLD, atrial fibrillation, CHF, CAD.  Home medications include amiodarone , Eliquis , Lasix , metoprolol .  Although she was not sure which medications she is currently taking, she does state that she has been adherent to her home medications.  She does state that she has been taking her Eliquis  and has not missed any doses over the past several weeks.  She did get her morning medications today.  About a week ago, she developed URI symptoms.  Since then, she has had fatigue and shortness of breath that is worsened with exertion.  She has some transient lightheadedness.  Severity of symptoms have waxed and waned over the past week.  Today, she experienced generalized weakness prompting a call to EMS.  Fire department arrived on scene and got vital signs which seem to be normal.  Patient initially did not want to come to the hospital.  When EMS arrived, they got a twelve-lead EKG which showed atrial fibrillation with RVR.  Heart rate has been in the range of 150s prior to arrival.  She did receive 500 cc IVF which did seem to improve rapid heart rate.  Currently, she denies any areas of pain.  She endorses fatigue only.     Home Medications Prior to Admission medications   Medication Sig Start Date End Date Taking? Authorizing Provider  acetaminophen  (TYLENOL ) 500 MG tablet Take 500 mg by mouth every 6 (six) hours as needed for mild pain,  moderate pain, fever or headache.   Yes [provider]  apixaban  (ELIQUIS ) 5 MG TABS tablet Take 1 tablet (5 mg total) by mouth 2 (two) times daily. 12/05/22  Yes Turner, Wilbert SAUNDERS, MD  furosemide  (LASIX ) 20 MG tablet TAKE ONE TAB EVERY MON, WED, FRI. TAKE ONE ADDITIONAL TAB IF WEIGHT GAIN OF 3+ LBS OVERNIGHT 12/05/22  Yes Turner, Traci R, MD  hydrALAZINE  (APRESOLINE ) 10 MG tablet TAKE 1 TABLET BY MOUTH THREE TIMES A DAY 08/22/22  Yes Turner, Wilbert SAUNDERS, MD  hydrocortisone  (ANUSOL -HC) 2.5 % rectal cream Apply to perianal area 3 times daily. 11/14/17  Yes Zehr, Jessica D, PA-C  insulin  glargine (LANTUS ) 100 UNIT/ML Solostar Pen Inject 5 Units into the skin in the morning. 02/18/22  Yes Krishnan, Gokul, MD  metoprolol  tartrate (LOPRESSOR ) 25 MG tablet Take 0.5 tablets (12.5 mg total) by mouth 2 (two) times daily. 02/18/22  Yes Krishnan, Gokul, MD  Multiple Vitamin (MULTIVITAMIN) tablet Take 1 tablet by mouth in the morning.   Yes [provider]  ondansetron  (ZOFRAN ) 4 MG tablet Take 1 tablet (4 mg total) by mouth every 8 (eight) hours as needed for nausea or vomiting. 02/18/22  Yes Krishnan, Gokul, MD  pantoprazole  (PROTONIX ) 40 MG tablet TAKE 1 TABLET BY MOUTH EVERY DAY BEFORE BREAKFAST 02/03/20  Yes Avram Lupita BRAVO, MD  spironolactone  (ALDACTONE ) 25 MG tablet TAKE 1/2 TABLET BY MOUTH DAILY Patient taking differently: Take 12.5 mg by mouth daily. Tues, thurs, and sat 12/05/22  Yes  Shlomo Wilbert SAUNDERS, MD  ALPRAZolam  (XANAX ) 0.5 MG tablet Take 1-2 tablets (0.5-1 mg total) by mouth 2 (two) times daily as needed for anxiety. Patient not taking: Reported on 02/14/2023 02/18/22   Krishnan, Gokul, MD  amiodarone  (PACERONE ) 200 MG tablet Take 200mg  twice daily until 02/23/22 and then take 200mg  once daily Patient not taking: Reported on 02/14/2023 02/18/22   Krishnan, Gokul, MD  polyethylene glycol (MIRALAX  / GLYCOLAX ) 17 g packet Take 17 g by mouth 2 (two) times daily. Patient not taking: Reported on  02/14/2023 02/18/22   Krishnan, Gokul, MD      Allergies    Actos [pioglitazone], Amoxil  [amoxicillin ], Avandia [rosiglitazone], Fosamax [alendronate sodium], Ms contin  [morphine ], Ultram  [tramadol ], Amaryl [glimepiride], Januvia [sitagliptin], Lipitor [atorvastatin], Metformin and related, Prandin [repaglinide], Tradjenta [linagliptin], Zoloft [sertraline hcl], Asa [aspirin ], Bactrim [sulfamethoxazole-trimethoprim], Bentyl [dicyclomine hcl], Cipro  [ciprofloxacin  hcl], Hctz [hydrochlorothiazide ], Keflex [cephalexin], Morphine  and codeine, Norvasc  [amlodipine  besylate], Oxycontin [oxycodone], Sulfa antibiotics, and Penicillins    Review of Systems   Review of Systems  Constitutional:  Positive for activity change, appetite change and fatigue.  Respiratory:  Positive for shortness of breath.   Cardiovascular:  Positive for palpitations.  Neurological:  Positive for weakness (Generalized) and light-headedness.  All other systems reviewed and are negative.   Physical Exam Updated Vital Signs BP 124/83   Pulse (!) 59   Temp (!) 97.3 F (36.3 C) (Temporal)   Resp (!) 25   Ht 5' 4 (1.626 m)   Wt 69.4 kg   SpO2 96%   BMI 26.26 kg/m  Physical Exam Vitals and nursing note reviewed.  Constitutional:      General: She is not in acute distress.    Appearance: She is well-developed. She is not ill-appearing, toxic-appearing or diaphoretic.  HENT:     Head: Normocephalic and atraumatic.  Eyes:     Conjunctiva/sclera: Conjunctivae normal.  Cardiovascular:     Rate and Rhythm: Tachycardia present. Rhythm irregular.     Heart sounds: No murmur heard. Pulmonary:     Effort: Pulmonary effort is normal. No respiratory distress.     Breath sounds: Normal breath sounds. No decreased breath sounds, wheezing, rhonchi or rales.  Chest:     Chest wall: No tenderness.  Abdominal:     Palpations: Abdomen is soft.     Tenderness: There is no abdominal tenderness.  Musculoskeletal:        General: No  swelling. Normal range of motion.     Cervical back: Normal range of motion and neck supple.     Right lower leg: No edema.     Left lower leg: No edema.  Skin:    General: Skin is warm and dry.     Coloration: Skin is not cyanotic or pale.  Neurological:     General: No focal deficit present.     Mental Status: She is alert and oriented to person, place, and time.  Psychiatric:        Mood and Affect: Mood normal.        Behavior: Behavior normal.     ED Results / Procedures / Treatments   Labs (all labs ordered are listed, but only abnormal results are displayed) Labs Reviewed  BASIC METABOLIC PANEL - Abnormal; Notable for the following components:      Result Value   Glucose, Bld 240 (*)    BUN 35 (*)    Creatinine, Ser 1.02 (*)    GFR, Estimated 53 (*)    All other  components within normal limits  HEPATIC FUNCTION PANEL - Abnormal; Notable for the following components:   Total Protein 6.4 (*)    Albumin 3.2 (*)    Bilirubin, Direct 0.3 (*)    All other components within normal limits  BRAIN NATRIURETIC PEPTIDE - Abnormal; Notable for the following components:   B Natriuretic Peptide 683.8 (*)    All other components within normal limits  TROPONIN I (HIGH SENSITIVITY) - Abnormal; Notable for the following components:   Troponin I (High Sensitivity) 30 (*)    All other components within normal limits  TROPONIN I (HIGH SENSITIVITY) - Abnormal; Notable for the following components:   Troponin I (High Sensitivity) 29 (*)    All other components within normal limits  RESP PANEL BY RT-PCR (RSV, FLU A&B, COVID)  RVPGX2  MAGNESIUM   TSH  CBC    EKG EKG Interpretation Date/Time:  Tuesday February 14 2023 10:24:48 EST Ventricular Rate:  130 PR Interval:    QRS Duration:  90 QT Interval:  298 QTC Calculation: 439 R Axis:   64  Text Interpretation: Atrial fibrillation Ventricular premature complex Inferior infarct, age indeterminate Confirmed by Melvenia Motto [phone]) on  02/14/2023 10:43:45 AM  Radiology CT Chest Wo Contrast Result Date: 02/14/2023 CLINICAL DATA:  Respiratory illness, nondiagnostic xray EXAM: CT CHEST WITHOUT CONTRAST TECHNIQUE: Multidetector CT imaging of the chest was performed following the standard protocol without IV contrast. RADIATION DOSE REDUCTION: This exam was performed according to the departmental dose-optimization program which includes automated exposure control, adjustment of the mA and/or kV according to patient size and/or use of iterative reconstruction technique. COMPARISON:  CT 01/02/2022 FINDINGS: Cardiovascular: Mild cardiomegaly. Trace pericardial fluid. Ascending thoracic aorta measures 4.6 x 4.4 cm in diameter, previously 4.6 cm. Aortic and coronary artery atherosclerosis. Central pulmonary vasculature is dilated. Mediastinum/Nodes: No enlarged mediastinal or axillary lymph nodes. Thyroid  gland, trachea, and esophagus demonstrate no significant findings. Lungs/Pleura: Previously seen nodules within the right upper lobe, lingula, and right lower lobe have resolved. No new pulmonary nodules. No focal airspace consolidation. No pleural effusion or pneumothorax. Upper Abdomen: Probable vascular calcification in the left renal hilum. No acute abnormality within the included upper abdomen. Musculoskeletal: No new or acute osseous abnormality. No chest wall abnormality. IMPRESSION: 1. No acute intrathoracic findings. 2. Stable ascending thoracic aortic aneurysm measuring up to 4.6 cm. Recommend semi-annual imaging followup by CTA or MRA and referral to cardiothoracic surgery if not already obtained. This recommendation follows 2010 ACCF/AHA/AATS/ACR/ASA/SCA/SCAI/SIR/STS/SVM Guidelines for the Diagnosis and Management of Patients With Thoracic Aortic Disease. Circulation. 2010; 121: Z733-z630. Aortic aneurysm NOS (ICD10-I71.9) 3. Previously seen nodules within the right upper lobe, lingula, and right lower lobe have resolved, compatible with an  infectious or inflammatory etiology. No new pulmonary nodules. 4. Mild cardiomegaly with dilated central pulmonary vasculature, suggestive of pulmonary arterial hypertension. 5. Aortic and coronary artery atherosclerosis (ICD10-I70.0). Electronically Signed   By: Mabel Converse D.O.   On: 02/14/2023 14:14   DG Chest 2 View Result Date: 02/14/2023 CLINICAL DATA:  Chest pain. EXAM: CHEST - 2 VIEW COMPARISON:  Chest radiograph dated February 03, 2022. FINDINGS: Patient is rotated to the left. Mild cardiomegaly, unchanged. Aortic atherosclerosis. No focal consolidation, pleural effusion, or pneumothorax. Overlapping telemetry wires. No acute osseous abnormality. IMPRESSION: Cardiomegaly.  No acute cardiopulmonary findings. Electronically Signed   By: Harrietta Sherry M.D.   On: 02/14/2023 10:51    Procedures Procedures    Medications Ordered in ED Medications  amiodarone  (NEXTERONE  PREMIX) 360-4.14 MG/200ML-% (  1.8 mg/mL) IV infusion (60 mg/hr Intravenous New Bag/Given 02/14/23 1052)  amiodarone  (NEXTERONE  PREMIX) 360-4.14 MG/200ML-% (1.8 mg/mL) IV infusion (has no administration in time range)  metoprolol  tartrate (LOPRESSOR ) injection 5 mg (5 mg Intravenous Given 02/14/23 1053)  metoprolol  tartrate (LOPRESSOR ) tablet 12.5 mg (12.5 mg Oral Given 02/14/23 1348)    ED Course/ Medical Decision Making/ A&P                                 Medical Decision Making Amount and/or Complexity of Data Reviewed Labs: ordered. Radiology: ordered.  Risk Prescription drug management.   This patient presents to the ED for concern of generalized weakness, this involves an extensive number of treatment options, and is a complaint that carries with it a high risk of complications and morbidity.  The differential diagnosis includes deconditioning, polypharmacy, arrhythmia, infection, metabolic derangements, dehydration, anemia, CHF   Co morbidities that complicate the patient evaluation  HTN, aortic  aneurysm, IBS, GERD, anxiety, DM, HLD, atrial fibrillation, CHF, CAD   Additional history obtained:  Additional history obtained from EMS External records from outside source obtained and reviewed including EMR   Lab Tests:  I Ordered, and personally interpreted labs.  The pertinent results include: Normal kidney function, normal electrolytes, slightly elevated troponin that is stable on repeat, elevated BNP.   Imaging Studies ordered:  I ordered imaging studies including chest x-ray, CT chest I independently visualized and interpreted imaging which showed no acute findings I agree with the radiologist interpretation   Cardiac Monitoring: / EKG:  The patient was maintained on a cardiac monitor.  I personally viewed and interpreted the cardiac monitored which showed an underlying rhythm of: Atrial fibrillation   Consultations Obtained:  I requested consultation with the cardiology team,  and discussed lab and imaging findings as well as pertinent plan - they recommend: Will see in consult patient had   Problem List / ED Course / Critical interventions / Medication management  Patient presenting for intermittent fatigue and shortness of breath over the past week, worsened today.  EMS noted atrial fibrillation with RVR prior to arrival.  She received 500 cc IVF.  On arrival, heart rate remains elevated in the range of 120.  Blood pressure is moderately elevated with SBP in the 150s.  She currently denies any areas of pain.  She is well-appearing on exam.  Her breathing is currently unlabored.  Lungs are clear to auscultation.  Patient was placed on bedside cardiac monitor.  EKG confirms atrial fibrillation with RVR.  Workup was initiated.  Per chart review, last cardiology visit was August.  At the time, she was in normal sinus rhythm, maintained on amiodarone .  Given that she is on amiodarone , will initiate amiodarone  gtt.  Metoprolol  was ordered for rate control.  Patient remained in  A-fib but did have a normal rate at rest.  On reassessment, patient found to be sleeping.  She is easily awakened.  She states that she has simply felt exhausted lately.  She does have a 63 year old husband at home.  Although she does have caregiver support, she feels wore out.  I reached out to cardiology, given the patient is typically a normal sinus rhythm.  Cardiology will see in consult.  Care of patient signed out to oncoming ED provider. I ordered medication including amiodarone  and metoprolol  for atrial fibrillation Reevaluation of the patient after these medicines showed that the patient improved I have reviewed  the patients home medicines and have made adjustments as needed   Social Determinants of Health:  Lives independently  CRITICAL CARE Performed by: Bernardino Fireman   Total critical care time: 32 minutes  Critical care time was exclusive of separately billable procedures and treating other patients.  Critical care was necessary to treat or prevent imminent or life-threatening deterioration.  Critical care was time spent personally by me on the following activities: development of treatment plan with patient and/or surrogate as well as nursing, discussions with consultants, evaluation of patient's response to treatment, examination of patient, obtaining history from patient or surrogate, ordering and performing treatments and interventions, ordering and review of laboratory studies, ordering and review of radiographic studies, pulse oximetry and re-evaluation of patient's condition.         Final Clinical Impression(s) / ED Diagnoses Final diagnoses:  Atrial fibrillation with RVR [facility])    Rx / DC Orders ED Discharge Orders     None         Fireman Bernardino, MD 02/14/23 1549

## 2023-02-15 ENCOUNTER — Ambulatory Visit (HOSPITAL_BASED_OUTPATIENT_CLINIC_OR_DEPARTMENT_OTHER): Payer: Medicare PPO

## 2023-02-19 ENCOUNTER — Other Ambulatory Visit: Payer: Self-pay | Admitting: Cardiology

## 2023-03-02 ENCOUNTER — Ambulatory Visit: Payer: Medicare PPO | Admitting: Physician Assistant

## 2023-03-16 NOTE — Progress Notes (Deleted)
 Cardiology Office Note:    Date:  03/16/2023   ID:  DEVONIA FARRO, DOB [birth year], MRN [id number]  PCP:  Tally Joe, MD   [facility] Providers Cardiologist:  Armanda Magic, MD { Click to update primary MD,subspecialty MD or APP then REFRESH:1}    Referring MD: Tally Joe, MD   No chief complaint on file. ***  History of Present Illness:    [name] is a 89 y.o. female with a hx of HFpEF, paroxysmal atrial fibrillation on Eliquis , CAD, mitral regurgitation, diabetes, and thoracic aortic aneurysm who is being seen 02/14/2023 for the evaluation of atrial fibrillation with RVR at the request of Dr. Durwin Nora.   She presented to the [facility] ED via EMS today, 02/14/23, with chief complaints of palpitations and chest pains. Field EKG revealed atrial fibrillation with RVR, rates in the 150s PTA. She was given 500 cc IVF which seems to improve her rapid HR. In the ED patient denied any areas of pain and only complains of fatigue.    She was previously admitted in December 2023 and January 2024. In December 2023 she was admitted for atrial fibrillation with rapid HR at which time she spontaneously converted back to NSR. Her troponins in Dec. 2023 were 132>2,092>3,162>2,858 but she opted for conservative management and did not want an invasive ischemic workup. In January 2024 she was admitted fro gastroenteritis which was complicated by recurrent atrial fibrillation and decompensated heart failure. At this time she was started on amiodarone which restored NSR.   She is a patient of Dr. Mayford Knife and was last seen by Tereso Newcomer, PA-C on 09/26/22 for an outpatient follow up. At this visit she was in sinus rhythm. Her current regimen was amiodarone 200 mg daily and Lopressor 12.5 mg BID.  Patient reported that she only get palpitations when she has increased stress. She was taking Eliquis 5 mg BID for anticoagulation.   In regards to her heart failure, it was likely in the setting of  AFib with RVR and her volume status seemed stable at her last visit. She was currently on Lasix 20 mg MWF and spironolactone 12.5 mg daily. She was also recommended to take an additional 20 mg Lasix if she had a weight gain of over 3 lb in 1 day. Her recently checked BMET was stable. For her hypertension, she was well controlled taking the previously mentioned Lopressor and spironolactone as well as hydralazine 10 mg TID.    After seeing the patient she reports that she does not currently have any chest pain or shortness of breath. She only remains to feel fatigued. She reported that she took her HR this morning and it was 113. She said she took all of her morning medications. She has been taking her weight daily and has not needed to take her PRN Lasix because her weight was never changed by more than 1-2 lbs. She has chronic LE edema that is unchanged from her baseline. She denies any recent illnesses or known sick contacts. She says that she has been having added stress recently that has been accumulating as she is taking care of her 92yo husband.   Paroxysmal atrial fibrillation with RVR Field EKG demonstrated atrial fibrillation with RVR rates 150s  EKG in ED demonstrated atrial fibrillation with RVR HR 130 Patient has been previously hospitalized Dec 2023/Jan 2024 with AFib with RVR  PTA medications are amiodarone 200 mg daily, Lopressor 12.5 mg BID, Eliquis 5 mg BID for anticoagulation  --  Patient was given Lopressor 5 mg IV + 12.5 mg PO and started on amiodarone drip in the ED -- Updated EKG shows patient has successfully converted to sinus bradycardia, she has no alarming symptoms and is okay to discharge home from cardiology standpoint Patient will need help with social work to get transportation home   Follow up will be scheduled for patient with general cardiology   HFpEF Coronary artery calcification seen on CT scan Ascending aortic aneurysm  Echo from 12/2021 showed LVEF of 55-60%  with basal inferior hypokinesis/ akinesis and grade 1 diastolic dysfunction  PTA medications are Lasix 20 mg MWF, spironolactone 12.5 mg daily, with additional PRN Lasix 20 mg for weight gain of over 3 lb in 1 day  Troponins 30>29 BNP  683, compared to 970 1 yr ago  CXR showed: cardiomegaly. No acute cardiopulmonary findings  CT chest showed: stable ascending thoracic aortic aneurysm measuring up to 4.6 cm, resolution of previously noted pulmonary nodules, mild cardiomegaly with dilated central pulmonary vasculature, suggestive of pulmonary arterial hypertension, aortic and coronary artery atherosclerosis  Weight on admission: 153 lbs   Hypertension Most recent BP reading 124/83 PTA medications were Lopressor 12.5 mg BID, hydralazine 10 mg TID, spironolactone 25 mg daily  -- Patient has been given Lopressor 5 mg IV + 12.5 mg PO      Past Medical History:  Diagnosis Date   Abdominal pain, chronic, left lower quadrant    Acute torn meniscus of knee    Adenomatous colon polyp 1970   carcinoma in situ   Allergic rhinitis    Amaurosis fugax 08/07/2012   Anxiety    Ascending aortic aneurysm (HCC) 12/07/2015   44mm by chest CTA 08/2020   Benign essential tremor syndrome    Bicuspid aortic valve    no AS on 07/2019   Carotid stenosis    1039 bilateral by dopplers 03/2017.    colon ca dx'd 1970   surg only   Coronary artery calcification seen on CT scan 09/26/2022   - Cath in 2002 normal - Myoview in 2017 low risk - Admx in 12/2021 w AF w RVR and elevated hsT c/w NSTEMI - pt preferred conservative mgmt (no cath)    DDD (degenerative disc disease)    Diverticulitis    Diverticulosis    DM (diabetes mellitus) (HCC)    Duodenitis    peptic, with gastric heterotopia   Endometriosis    s/p hysterectomy   Fundic gland polyposis of stomach    GERD (gastroesophageal reflux disease)    Heart murmur    Hiatal hernia 02/03/2005   History of cerebral aneurysm repair    s/p coiling    History of hemorrhoids    with bleeding   History of shingles    HLD (hyperlipidemia)    HTN (hypertension)    Hypercholesteremia    IBS (irritable bowel syndrome)    Iron deficiency    Lung nodule 07/12/2021   CT 05/2021: 3 mm right solid pulmonary nodule-no routine follow-up imaging recommended   Migraine    MVP (mitral valve prolapse)    Osteopenia    Peripheral neuropathy    Toe fracture, right    second toe   UTI (lower urinary tract infection)    Varicose vein     Past Surgical History:  Procedure Laterality Date   ABDOMINAL HYSTERECTOMY     APPENDECTOMY     CARPAL TUNNEL RELEASE  08/26/07   CATARACT EXTRACTION     CEREBRAL ANEURYSM  REPAIR     COLON RESECTION     COLONOSCOPY  06/07/08   divertiulosis, internal hemorrhoids   COLONOSCOPY W/ BIOPSIES AND POLYPECTOMY  02/03/05   diverticulosis, 4 mm sessile polyps, internal and external hemorrhoids   CORONARY ANGIOPLASTY WITH STENT PLACEMENT     ESOPHAGOGASTRODUODENOSCOPY  02/03/05   hiatal hernia, 6 benign gastric polyps   FLEXIBLE SIGMOIDOSCOPY     HAND SURGERY Right    INTRAOCULAR LENS INSERTION     right hand decompressive fasciotomy  08/26/07   , dorsal and volar   TONSILLECTOMY      Current Medications: No outpatient medications have been marked as taking for the 03/20/23 encounter (Appointment) with Filbert Schilder, NP.     Allergies:   Actos [pioglitazone], Amoxil [amoxicillin], Avandia [rosiglitazone], Fosamax [alendronate sodium], Ms contin [morphine], Ultram [tramadol], Amaryl [glimepiride], Januvia [sitagliptin], Lipitor [atorvastatin], Metformin and related, Prandin [repaglinide], Tradjenta [linagliptin], Zoloft [sertraline hcl], Asa [aspirin], Bactrim [sulfamethoxazole-trimethoprim], Bentyl [dicyclomine hcl], Cipro [ciprofloxacin hcl], Hctz [hydrochlorothiazide], Keflex [cephalexin], Morphine and codeine, Norvasc [amlodipine besylate], Oxycontin [oxycodone], Sulfa antibiotics, and Penicillins   Social History    Socioeconomic History   Marital status: Married    Spouse name: Charles   Number of children: 0   Years of education: Not on file   Highest education level: Not on file  Occupational History   Occupation: retired    Associate Professor: RETIRED  Tobacco Use   Smoking status: Never   Smokeless tobacco: Never  Vaping Use   Vaping status: Never Used  Substance and Sexual Activity   Alcohol use: No   Drug use: No   Sexual activity: Not on file  Other Topics Concern   Not on file  Social History Narrative   Lives with husband    Right handed   Caffeine: 2 c of coffee a day   Social Drivers of Corporate investment banker Strain: Not on file  Food Insecurity: No Food Insecurity (02/05/2022)   Hunger Vital Sign    Worried About Running Out of Food in the Last Year: Never true    Ran Out of Food in the Last Year: Never true  Transportation Needs: No Transportation Needs (02/05/2022)   PRAPARE - Administrator, Civil Service (Medical): No    Lack of Transportation (Non-Medical): No  Physical Activity: Not on file  Stress: Not on file  Social Connections: Not on file     Family History: The patient's ***family history includes Colon cancer in an other family member; Diabetes in her brother, brother, paternal grandmother, and another family member; Heart disease in her brother, brother, and father; Hypertension in her brother; Kidney disease in her father; Microcephaly in her brother; Ovarian cancer in her mother.  ROS:   Please see the history of present illness.    *** All other systems reviewed and are negative.  EKGs/Labs/Other Studies Reviewed:    The following studies were reviewed today: ***      Recent Labs: 09/26/2022: Hemoglobin 12.7; Platelets 263 02/14/2023: ALT 15; B Natriuretic Peptide 683.8; BUN 35; Creatinine, Ser 1.02; Magnesium 1.7; Potassium 5.0; Sodium 141; TSH 1.135  Recent Lipid Panel    Component Value Date/Time   CHOL 144 01/02/2022 1056   TRIG  101 01/02/2022 1056   HDL 32 (L) 01/02/2022 1056   CHOLHDL 4.5 01/02/2022 1056   VLDL 20 01/02/2022 1056   LDLCALC 92 01/02/2022 1056     Risk Assessment/Calculations:   {Does this patient have ATRIAL FIBRILLATION?[phone]}  No BP recorded.  {Refresh Note OR Click here to enter BP  :1}***         Physical Exam:    VS:  There were no vitals taken for this visit.    Wt Readings from Last 3 Encounters:  02/14/23 153 lb (69.4 kg)  09/26/22 151 lb 9.6 oz (68.8 kg)  02/18/22 140 lb 3.2 oz (63.6 kg)     GEN: *** Well nourished, well developed in no acute distress HEENT: Normal NECK: No JVD; No carotid bruits LYMPHATICS: No lymphadenopathy CARDIAC: ***RRR, no murmurs, rubs, gallops RESPIRATORY:  Clear to auscultation without rales, wheezing or rhonchi  ABDOMEN: Soft, non-tender, non-distended MUSCULOSKELETAL:  No edema; No deformity  SKIN: Warm and dry NEUROLOGIC:  Alert and oriented x 3 PSYCHIATRIC:  Normal affect   ASSESSMENT:    No diagnosis found. PLAN:    In order of problems listed above:  ***      {Are you ordering a CV Procedure (e.g. stress test, cath, DCCV, TEE, etc)?   Press F2        :[id number]}    Medication Adjustments/Labs and Tests Ordered: Current medicines are reviewed at length with the patient today.  Concerns regarding medicines are outlined above.  No orders of the defined types were placed in this encounter.  No orders of the defined types were placed in this encounter.   There are no Patient Instructions on file for this visit.   Signed, Georgie Chard, NP  03/16/2023 2:33 PM    [facility]

## 2023-03-20 ENCOUNTER — Ambulatory Visit: Payer: Medicare PPO | Attending: Cardiology | Admitting: Cardiology

## 2023-03-21 ENCOUNTER — Encounter: Payer: Self-pay | Admitting: Cardiology

## 2023-03-31 DIAGNOSIS — F419 Anxiety disorder, unspecified: Secondary | ICD-10-CM | POA: Diagnosis not present

## 2023-03-31 DIAGNOSIS — I671 Cerebral aneurysm, nonruptured: Secondary | ICD-10-CM | POA: Diagnosis not present

## 2023-03-31 DIAGNOSIS — I480 Paroxysmal atrial fibrillation: Secondary | ICD-10-CM | POA: Diagnosis not present

## 2023-03-31 DIAGNOSIS — G629 Polyneuropathy, unspecified: Secondary | ICD-10-CM | POA: Diagnosis not present

## 2023-03-31 DIAGNOSIS — E1140 Type 2 diabetes mellitus with diabetic neuropathy, unspecified: Secondary | ICD-10-CM | POA: Diagnosis not present

## 2023-03-31 DIAGNOSIS — E782 Mixed hyperlipidemia: Secondary | ICD-10-CM | POA: Diagnosis not present

## 2023-03-31 DIAGNOSIS — I10 Essential (primary) hypertension: Secondary | ICD-10-CM | POA: Diagnosis not present

## 2023-03-31 DIAGNOSIS — N1831 Chronic kidney disease, stage 3a: Secondary | ICD-10-CM | POA: Diagnosis not present

## 2023-03-31 DIAGNOSIS — E559 Vitamin D deficiency, unspecified: Secondary | ICD-10-CM | POA: Diagnosis not present

## 2023-04-04 DIAGNOSIS — Z111 Encounter for screening for respiratory tuberculosis: Secondary | ICD-10-CM | POA: Diagnosis not present

## 2023-04-12 ENCOUNTER — Telehealth: Payer: Self-pay | Admitting: Cardiology

## 2023-04-12 DIAGNOSIS — M25569 Pain in unspecified knee: Secondary | ICD-10-CM | POA: Diagnosis not present

## 2023-04-12 DIAGNOSIS — R32 Unspecified urinary incontinence: Secondary | ICD-10-CM | POA: Diagnosis not present

## 2023-04-12 DIAGNOSIS — I4891 Unspecified atrial fibrillation: Secondary | ICD-10-CM | POA: Diagnosis not present

## 2023-04-12 DIAGNOSIS — I10 Essential (primary) hypertension: Secondary | ICD-10-CM | POA: Diagnosis not present

## 2023-04-12 DIAGNOSIS — K219 Gastro-esophageal reflux disease without esophagitis: Secondary | ICD-10-CM | POA: Diagnosis not present

## 2023-04-12 DIAGNOSIS — Z7901 Long term (current) use of anticoagulants: Secondary | ICD-10-CM | POA: Diagnosis not present

## 2023-04-12 DIAGNOSIS — S31819A Unspecified open wound of right buttock, initial encounter: Secondary | ICD-10-CM | POA: Diagnosis not present

## 2023-04-12 DIAGNOSIS — E119 Type 2 diabetes mellitus without complications: Secondary | ICD-10-CM | POA: Diagnosis not present

## 2023-04-12 DIAGNOSIS — I5032 Chronic diastolic (congestive) heart failure: Secondary | ICD-10-CM

## 2023-04-12 DIAGNOSIS — K5900 Constipation, unspecified: Secondary | ICD-10-CM | POA: Diagnosis not present

## 2023-04-12 NOTE — Telephone Encounter (Signed)
 Spoke with Cordelia Pen from pt's assisted living facility. She stated that they need a order to take the pt's weight daily to see if she has gained excess weight each day. An order was put in and signed. Cordelia Pen also stated that the order for Lasix on her end does not say to take an extra dose if the pt gains 3 or more pounds over night and/or 5 or more pounds in a week. The prescription on our end that has that instruction was printed off and faxed to the facility as asked. Cordelia Pen verbalized understanding. All questions, if any, were answered.

## 2023-04-12 NOTE — Telephone Encounter (Signed)
 Caller Cordelia Pen) called to report to Dr. Mayford Knife that patient's weight yesterday was 162.6 lbs.  Caller stated they will need an order if patient is to have daily weights and/or weight gain notification.  Caller provided fax# [phone].

## 2023-04-19 DIAGNOSIS — I5032 Chronic diastolic (congestive) heart failure: Secondary | ICD-10-CM | POA: Diagnosis not present

## 2023-04-19 DIAGNOSIS — F411 Generalized anxiety disorder: Secondary | ICD-10-CM | POA: Diagnosis not present

## 2023-04-19 DIAGNOSIS — I480 Paroxysmal atrial fibrillation: Secondary | ICD-10-CM | POA: Diagnosis not present

## 2023-04-19 DIAGNOSIS — S31819D Unspecified open wound of right buttock, subsequent encounter: Secondary | ICD-10-CM | POA: Diagnosis not present

## 2023-04-19 DIAGNOSIS — S81801D Unspecified open wound, right lower leg, subsequent encounter: Secondary | ICD-10-CM | POA: Diagnosis not present

## 2023-04-19 DIAGNOSIS — E119 Type 2 diabetes mellitus without complications: Secondary | ICD-10-CM | POA: Diagnosis not present

## 2023-04-19 DIAGNOSIS — I2510 Atherosclerotic heart disease of native coronary artery without angina pectoris: Secondary | ICD-10-CM | POA: Diagnosis not present

## 2023-04-19 DIAGNOSIS — I7121 Aneurysm of the ascending aorta, without rupture: Secondary | ICD-10-CM | POA: Diagnosis not present

## 2023-04-19 DIAGNOSIS — I110 Hypertensive heart disease with heart failure: Secondary | ICD-10-CM | POA: Diagnosis not present

## 2023-04-21 DIAGNOSIS — E119 Type 2 diabetes mellitus without complications: Secondary | ICD-10-CM | POA: Diagnosis not present

## 2023-04-21 DIAGNOSIS — I480 Paroxysmal atrial fibrillation: Secondary | ICD-10-CM | POA: Diagnosis not present

## 2023-04-21 DIAGNOSIS — I110 Hypertensive heart disease with heart failure: Secondary | ICD-10-CM | POA: Diagnosis not present

## 2023-04-21 DIAGNOSIS — S81801D Unspecified open wound, right lower leg, subsequent encounter: Secondary | ICD-10-CM | POA: Diagnosis not present

## 2023-04-21 DIAGNOSIS — I7121 Aneurysm of the ascending aorta, without rupture: Secondary | ICD-10-CM | POA: Diagnosis not present

## 2023-04-21 DIAGNOSIS — F411 Generalized anxiety disorder: Secondary | ICD-10-CM | POA: Diagnosis not present

## 2023-04-21 DIAGNOSIS — S31819D Unspecified open wound of right buttock, subsequent encounter: Secondary | ICD-10-CM | POA: Diagnosis not present

## 2023-04-21 DIAGNOSIS — I5032 Chronic diastolic (congestive) heart failure: Secondary | ICD-10-CM | POA: Diagnosis not present

## 2023-04-21 DIAGNOSIS — I2510 Atherosclerotic heart disease of native coronary artery without angina pectoris: Secondary | ICD-10-CM | POA: Diagnosis not present

## 2023-04-21 DIAGNOSIS — I10 Essential (primary) hypertension: Secondary | ICD-10-CM | POA: Diagnosis not present

## 2023-04-25 DIAGNOSIS — S81801D Unspecified open wound, right lower leg, subsequent encounter: Secondary | ICD-10-CM | POA: Diagnosis not present

## 2023-04-25 DIAGNOSIS — I2510 Atherosclerotic heart disease of native coronary artery without angina pectoris: Secondary | ICD-10-CM | POA: Diagnosis not present

## 2023-04-25 DIAGNOSIS — I480 Paroxysmal atrial fibrillation: Secondary | ICD-10-CM | POA: Diagnosis not present

## 2023-04-25 DIAGNOSIS — I7121 Aneurysm of the ascending aorta, without rupture: Secondary | ICD-10-CM | POA: Diagnosis not present

## 2023-04-25 DIAGNOSIS — E119 Type 2 diabetes mellitus without complications: Secondary | ICD-10-CM | POA: Diagnosis not present

## 2023-04-25 DIAGNOSIS — I110 Hypertensive heart disease with heart failure: Secondary | ICD-10-CM | POA: Diagnosis not present

## 2023-04-25 DIAGNOSIS — I5032 Chronic diastolic (congestive) heart failure: Secondary | ICD-10-CM | POA: Diagnosis not present

## 2023-04-25 DIAGNOSIS — S31819D Unspecified open wound of right buttock, subsequent encounter: Secondary | ICD-10-CM | POA: Diagnosis not present

## 2023-04-25 DIAGNOSIS — F411 Generalized anxiety disorder: Secondary | ICD-10-CM | POA: Diagnosis not present

## 2023-04-26 DIAGNOSIS — I7121 Aneurysm of the ascending aorta, without rupture: Secondary | ICD-10-CM | POA: Diagnosis not present

## 2023-04-26 DIAGNOSIS — F411 Generalized anxiety disorder: Secondary | ICD-10-CM | POA: Diagnosis not present

## 2023-04-26 DIAGNOSIS — S81801D Unspecified open wound, right lower leg, subsequent encounter: Secondary | ICD-10-CM | POA: Diagnosis not present

## 2023-04-26 DIAGNOSIS — I2510 Atherosclerotic heart disease of native coronary artery without angina pectoris: Secondary | ICD-10-CM | POA: Diagnosis not present

## 2023-04-26 DIAGNOSIS — I5032 Chronic diastolic (congestive) heart failure: Secondary | ICD-10-CM | POA: Diagnosis not present

## 2023-04-26 DIAGNOSIS — S31819D Unspecified open wound of right buttock, subsequent encounter: Secondary | ICD-10-CM | POA: Diagnosis not present

## 2023-04-26 DIAGNOSIS — I480 Paroxysmal atrial fibrillation: Secondary | ICD-10-CM | POA: Diagnosis not present

## 2023-04-26 DIAGNOSIS — I110 Hypertensive heart disease with heart failure: Secondary | ICD-10-CM | POA: Diagnosis not present

## 2023-04-26 DIAGNOSIS — E119 Type 2 diabetes mellitus without complications: Secondary | ICD-10-CM | POA: Diagnosis not present

## 2023-04-28 DIAGNOSIS — I5032 Chronic diastolic (congestive) heart failure: Secondary | ICD-10-CM | POA: Diagnosis not present

## 2023-04-28 DIAGNOSIS — I7121 Aneurysm of the ascending aorta, without rupture: Secondary | ICD-10-CM | POA: Diagnosis not present

## 2023-04-28 DIAGNOSIS — I480 Paroxysmal atrial fibrillation: Secondary | ICD-10-CM | POA: Diagnosis not present

## 2023-04-28 DIAGNOSIS — F411 Generalized anxiety disorder: Secondary | ICD-10-CM | POA: Diagnosis not present

## 2023-04-28 DIAGNOSIS — E119 Type 2 diabetes mellitus without complications: Secondary | ICD-10-CM | POA: Diagnosis not present

## 2023-04-28 DIAGNOSIS — I110 Hypertensive heart disease with heart failure: Secondary | ICD-10-CM | POA: Diagnosis not present

## 2023-04-28 DIAGNOSIS — I2510 Atherosclerotic heart disease of native coronary artery without angina pectoris: Secondary | ICD-10-CM | POA: Diagnosis not present

## 2023-04-28 DIAGNOSIS — S31819D Unspecified open wound of right buttock, subsequent encounter: Secondary | ICD-10-CM | POA: Diagnosis not present

## 2023-04-28 DIAGNOSIS — S81801D Unspecified open wound, right lower leg, subsequent encounter: Secondary | ICD-10-CM | POA: Diagnosis not present

## 2023-05-01 DIAGNOSIS — I5032 Chronic diastolic (congestive) heart failure: Secondary | ICD-10-CM | POA: Diagnosis not present

## 2023-05-01 DIAGNOSIS — I110 Hypertensive heart disease with heart failure: Secondary | ICD-10-CM | POA: Diagnosis not present

## 2023-05-01 DIAGNOSIS — E119 Type 2 diabetes mellitus without complications: Secondary | ICD-10-CM | POA: Diagnosis not present

## 2023-05-01 DIAGNOSIS — I480 Paroxysmal atrial fibrillation: Secondary | ICD-10-CM | POA: Diagnosis not present

## 2023-05-01 DIAGNOSIS — S81801D Unspecified open wound, right lower leg, subsequent encounter: Secondary | ICD-10-CM | POA: Diagnosis not present

## 2023-05-01 DIAGNOSIS — S31819D Unspecified open wound of right buttock, subsequent encounter: Secondary | ICD-10-CM | POA: Diagnosis not present

## 2023-05-01 DIAGNOSIS — F411 Generalized anxiety disorder: Secondary | ICD-10-CM | POA: Diagnosis not present

## 2023-05-01 DIAGNOSIS — I7121 Aneurysm of the ascending aorta, without rupture: Secondary | ICD-10-CM | POA: Diagnosis not present

## 2023-05-01 DIAGNOSIS — I2510 Atherosclerotic heart disease of native coronary artery without angina pectoris: Secondary | ICD-10-CM | POA: Diagnosis not present

## 2023-05-02 DIAGNOSIS — I110 Hypertensive heart disease with heart failure: Secondary | ICD-10-CM | POA: Diagnosis not present

## 2023-05-02 DIAGNOSIS — I7121 Aneurysm of the ascending aorta, without rupture: Secondary | ICD-10-CM | POA: Diagnosis not present

## 2023-05-02 DIAGNOSIS — F411 Generalized anxiety disorder: Secondary | ICD-10-CM | POA: Diagnosis not present

## 2023-05-02 DIAGNOSIS — E119 Type 2 diabetes mellitus without complications: Secondary | ICD-10-CM | POA: Diagnosis not present

## 2023-05-02 DIAGNOSIS — I5032 Chronic diastolic (congestive) heart failure: Secondary | ICD-10-CM | POA: Diagnosis not present

## 2023-05-02 DIAGNOSIS — I2510 Atherosclerotic heart disease of native coronary artery without angina pectoris: Secondary | ICD-10-CM | POA: Diagnosis not present

## 2023-05-02 DIAGNOSIS — S81801D Unspecified open wound, right lower leg, subsequent encounter: Secondary | ICD-10-CM | POA: Diagnosis not present

## 2023-05-02 DIAGNOSIS — I480 Paroxysmal atrial fibrillation: Secondary | ICD-10-CM | POA: Diagnosis not present

## 2023-05-02 DIAGNOSIS — S31819D Unspecified open wound of right buttock, subsequent encounter: Secondary | ICD-10-CM | POA: Diagnosis not present

## 2023-05-03 DIAGNOSIS — I110 Hypertensive heart disease with heart failure: Secondary | ICD-10-CM | POA: Diagnosis not present

## 2023-05-03 DIAGNOSIS — E119 Type 2 diabetes mellitus without complications: Secondary | ICD-10-CM | POA: Diagnosis not present

## 2023-05-03 DIAGNOSIS — S31819D Unspecified open wound of right buttock, subsequent encounter: Secondary | ICD-10-CM | POA: Diagnosis not present

## 2023-05-04 DIAGNOSIS — E119 Type 2 diabetes mellitus without complications: Secondary | ICD-10-CM | POA: Diagnosis not present

## 2023-05-04 DIAGNOSIS — I480 Paroxysmal atrial fibrillation: Secondary | ICD-10-CM | POA: Diagnosis not present

## 2023-05-04 DIAGNOSIS — I7121 Aneurysm of the ascending aorta, without rupture: Secondary | ICD-10-CM | POA: Diagnosis not present

## 2023-05-04 DIAGNOSIS — I2510 Atherosclerotic heart disease of native coronary artery without angina pectoris: Secondary | ICD-10-CM | POA: Diagnosis not present

## 2023-05-04 DIAGNOSIS — S81801D Unspecified open wound, right lower leg, subsequent encounter: Secondary | ICD-10-CM | POA: Diagnosis not present

## 2023-05-04 DIAGNOSIS — S31819D Unspecified open wound of right buttock, subsequent encounter: Secondary | ICD-10-CM | POA: Diagnosis not present

## 2023-05-04 DIAGNOSIS — I110 Hypertensive heart disease with heart failure: Secondary | ICD-10-CM | POA: Diagnosis not present

## 2023-05-04 DIAGNOSIS — F411 Generalized anxiety disorder: Secondary | ICD-10-CM | POA: Diagnosis not present

## 2023-05-04 DIAGNOSIS — I5032 Chronic diastolic (congestive) heart failure: Secondary | ICD-10-CM | POA: Diagnosis not present

## 2023-05-08 DIAGNOSIS — N1831 Chronic kidney disease, stage 3a: Secondary | ICD-10-CM | POA: Diagnosis not present

## 2023-05-08 DIAGNOSIS — I4891 Unspecified atrial fibrillation: Secondary | ICD-10-CM | POA: Diagnosis not present

## 2023-05-08 DIAGNOSIS — E119 Type 2 diabetes mellitus without complications: Secondary | ICD-10-CM | POA: Diagnosis not present

## 2023-05-08 DIAGNOSIS — I2510 Atherosclerotic heart disease of native coronary artery without angina pectoris: Secondary | ICD-10-CM | POA: Diagnosis not present

## 2023-05-08 DIAGNOSIS — S81801D Unspecified open wound, right lower leg, subsequent encounter: Secondary | ICD-10-CM | POA: Diagnosis not present

## 2023-05-08 DIAGNOSIS — I7121 Aneurysm of the ascending aorta, without rupture: Secondary | ICD-10-CM | POA: Diagnosis not present

## 2023-05-08 DIAGNOSIS — S31819D Unspecified open wound of right buttock, subsequent encounter: Secondary | ICD-10-CM | POA: Diagnosis not present

## 2023-05-08 DIAGNOSIS — F411 Generalized anxiety disorder: Secondary | ICD-10-CM | POA: Diagnosis not present

## 2023-05-08 DIAGNOSIS — I480 Paroxysmal atrial fibrillation: Secondary | ICD-10-CM | POA: Diagnosis not present

## 2023-05-08 DIAGNOSIS — I130 Hypertensive heart and chronic kidney disease with heart failure and stage 1 through stage 4 chronic kidney disease, or unspecified chronic kidney disease: Secondary | ICD-10-CM | POA: Diagnosis not present

## 2023-05-08 DIAGNOSIS — E1122 Type 2 diabetes mellitus with diabetic chronic kidney disease: Secondary | ICD-10-CM | POA: Diagnosis not present

## 2023-05-08 DIAGNOSIS — I5032 Chronic diastolic (congestive) heart failure: Secondary | ICD-10-CM | POA: Diagnosis not present

## 2023-05-08 DIAGNOSIS — I110 Hypertensive heart disease with heart failure: Secondary | ICD-10-CM | POA: Diagnosis not present

## 2023-05-08 DIAGNOSIS — R609 Edema, unspecified: Secondary | ICD-10-CM | POA: Diagnosis not present

## 2023-05-10 DIAGNOSIS — F411 Generalized anxiety disorder: Secondary | ICD-10-CM | POA: Diagnosis not present

## 2023-05-10 DIAGNOSIS — E119 Type 2 diabetes mellitus without complications: Secondary | ICD-10-CM | POA: Diagnosis not present

## 2023-05-10 DIAGNOSIS — I110 Hypertensive heart disease with heart failure: Secondary | ICD-10-CM | POA: Diagnosis not present

## 2023-05-10 DIAGNOSIS — S31819D Unspecified open wound of right buttock, subsequent encounter: Secondary | ICD-10-CM | POA: Diagnosis not present

## 2023-05-10 DIAGNOSIS — I2510 Atherosclerotic heart disease of native coronary artery without angina pectoris: Secondary | ICD-10-CM | POA: Diagnosis not present

## 2023-05-10 DIAGNOSIS — I5032 Chronic diastolic (congestive) heart failure: Secondary | ICD-10-CM | POA: Diagnosis not present

## 2023-05-10 DIAGNOSIS — I480 Paroxysmal atrial fibrillation: Secondary | ICD-10-CM | POA: Diagnosis not present

## 2023-05-10 DIAGNOSIS — S81801D Unspecified open wound, right lower leg, subsequent encounter: Secondary | ICD-10-CM | POA: Diagnosis not present

## 2023-05-10 DIAGNOSIS — I7121 Aneurysm of the ascending aorta, without rupture: Secondary | ICD-10-CM | POA: Diagnosis not present

## 2023-05-11 DIAGNOSIS — S81801D Unspecified open wound, right lower leg, subsequent encounter: Secondary | ICD-10-CM | POA: Diagnosis not present

## 2023-05-11 DIAGNOSIS — I110 Hypertensive heart disease with heart failure: Secondary | ICD-10-CM | POA: Diagnosis not present

## 2023-05-11 DIAGNOSIS — I2510 Atherosclerotic heart disease of native coronary artery without angina pectoris: Secondary | ICD-10-CM | POA: Diagnosis not present

## 2023-05-11 DIAGNOSIS — E119 Type 2 diabetes mellitus without complications: Secondary | ICD-10-CM | POA: Diagnosis not present

## 2023-05-11 DIAGNOSIS — S31819D Unspecified open wound of right buttock, subsequent encounter: Secondary | ICD-10-CM | POA: Diagnosis not present

## 2023-05-11 DIAGNOSIS — I7121 Aneurysm of the ascending aorta, without rupture: Secondary | ICD-10-CM | POA: Diagnosis not present

## 2023-05-11 DIAGNOSIS — I480 Paroxysmal atrial fibrillation: Secondary | ICD-10-CM | POA: Diagnosis not present

## 2023-05-11 DIAGNOSIS — F411 Generalized anxiety disorder: Secondary | ICD-10-CM | POA: Diagnosis not present

## 2023-05-11 DIAGNOSIS — I5032 Chronic diastolic (congestive) heart failure: Secondary | ICD-10-CM | POA: Diagnosis not present

## 2023-05-16 DIAGNOSIS — S31819D Unspecified open wound of right buttock, subsequent encounter: Secondary | ICD-10-CM | POA: Diagnosis not present

## 2023-05-16 DIAGNOSIS — I110 Hypertensive heart disease with heart failure: Secondary | ICD-10-CM | POA: Diagnosis not present

## 2023-05-16 DIAGNOSIS — I5032 Chronic diastolic (congestive) heart failure: Secondary | ICD-10-CM | POA: Diagnosis not present

## 2023-05-16 DIAGNOSIS — S81801D Unspecified open wound, right lower leg, subsequent encounter: Secondary | ICD-10-CM | POA: Diagnosis not present

## 2023-05-16 DIAGNOSIS — E119 Type 2 diabetes mellitus without complications: Secondary | ICD-10-CM | POA: Diagnosis not present

## 2023-05-16 DIAGNOSIS — I480 Paroxysmal atrial fibrillation: Secondary | ICD-10-CM | POA: Diagnosis not present

## 2023-05-16 DIAGNOSIS — I2510 Atherosclerotic heart disease of native coronary artery without angina pectoris: Secondary | ICD-10-CM | POA: Diagnosis not present

## 2023-05-16 DIAGNOSIS — I7121 Aneurysm of the ascending aorta, without rupture: Secondary | ICD-10-CM | POA: Diagnosis not present

## 2023-05-16 DIAGNOSIS — F411 Generalized anxiety disorder: Secondary | ICD-10-CM | POA: Diagnosis not present

## 2023-05-17 DIAGNOSIS — E119 Type 2 diabetes mellitus without complications: Secondary | ICD-10-CM | POA: Diagnosis not present

## 2023-05-17 DIAGNOSIS — I5032 Chronic diastolic (congestive) heart failure: Secondary | ICD-10-CM | POA: Diagnosis not present

## 2023-05-17 DIAGNOSIS — I110 Hypertensive heart disease with heart failure: Secondary | ICD-10-CM | POA: Diagnosis not present

## 2023-05-17 DIAGNOSIS — I480 Paroxysmal atrial fibrillation: Secondary | ICD-10-CM | POA: Diagnosis not present

## 2023-05-17 DIAGNOSIS — F411 Generalized anxiety disorder: Secondary | ICD-10-CM | POA: Diagnosis not present

## 2023-05-17 DIAGNOSIS — S81801D Unspecified open wound, right lower leg, subsequent encounter: Secondary | ICD-10-CM | POA: Diagnosis not present

## 2023-05-17 DIAGNOSIS — I7121 Aneurysm of the ascending aorta, without rupture: Secondary | ICD-10-CM | POA: Diagnosis not present

## 2023-05-17 DIAGNOSIS — S31819D Unspecified open wound of right buttock, subsequent encounter: Secondary | ICD-10-CM | POA: Diagnosis not present

## 2023-05-17 DIAGNOSIS — I2510 Atherosclerotic heart disease of native coronary artery without angina pectoris: Secondary | ICD-10-CM | POA: Diagnosis not present

## 2023-05-18 DIAGNOSIS — N1831 Chronic kidney disease, stage 3a: Secondary | ICD-10-CM | POA: Diagnosis not present

## 2023-05-18 DIAGNOSIS — I130 Hypertensive heart and chronic kidney disease with heart failure and stage 1 through stage 4 chronic kidney disease, or unspecified chronic kidney disease: Secondary | ICD-10-CM | POA: Diagnosis not present

## 2023-05-19 ENCOUNTER — Encounter (HOSPITAL_COMMUNITY): Payer: Self-pay | Admitting: Family Medicine

## 2023-05-19 ENCOUNTER — Observation Stay (HOSPITAL_COMMUNITY)
Admission: EM | Admit: 2023-05-19 | Discharge: 2023-05-20 | Disposition: A | Discharge status: Other Acute Inpatient Hospital | Attending: Family Medicine | Admitting: Family Medicine

## 2023-05-19 ENCOUNTER — Emergency Department (HOSPITAL_COMMUNITY)

## 2023-05-19 ENCOUNTER — Other Ambulatory Visit: Payer: Self-pay

## 2023-05-19 DIAGNOSIS — Q600 Renal agenesis, unilateral: Secondary | ICD-10-CM | POA: Diagnosis not present

## 2023-05-19 DIAGNOSIS — Z794 Long term (current) use of insulin: Secondary | ICD-10-CM | POA: Insufficient documentation

## 2023-05-19 DIAGNOSIS — E1169 Type 2 diabetes mellitus with other specified complication: Secondary | ICD-10-CM

## 2023-05-19 DIAGNOSIS — R1110 Vomiting, unspecified: Secondary | ICD-10-CM | POA: Insufficient documentation

## 2023-05-19 DIAGNOSIS — Z79899 Other long term (current) drug therapy: Secondary | ICD-10-CM | POA: Diagnosis not present

## 2023-05-19 DIAGNOSIS — R0789 Other chest pain: Secondary | ICD-10-CM | POA: Diagnosis not present

## 2023-05-19 DIAGNOSIS — I2489 Other forms of acute ischemic heart disease: Secondary | ICD-10-CM | POA: Diagnosis not present

## 2023-05-19 DIAGNOSIS — Z7901 Long term (current) use of anticoagulants: Secondary | ICD-10-CM | POA: Insufficient documentation

## 2023-05-19 DIAGNOSIS — R778 Other specified abnormalities of plasma proteins: Secondary | ICD-10-CM | POA: Diagnosis not present

## 2023-05-19 DIAGNOSIS — E1142 Type 2 diabetes mellitus with diabetic polyneuropathy: Secondary | ICD-10-CM | POA: Diagnosis not present

## 2023-05-19 DIAGNOSIS — I480 Paroxysmal atrial fibrillation: Principal | ICD-10-CM | POA: Insufficient documentation

## 2023-05-19 DIAGNOSIS — I499 Cardiac arrhythmia, unspecified: Secondary | ICD-10-CM | POA: Diagnosis not present

## 2023-05-19 DIAGNOSIS — I110 Hypertensive heart disease with heart failure: Secondary | ICD-10-CM | POA: Insufficient documentation

## 2023-05-19 DIAGNOSIS — I517 Cardiomegaly: Secondary | ICD-10-CM | POA: Diagnosis not present

## 2023-05-19 DIAGNOSIS — K529 Noninfective gastroenteritis and colitis, unspecified: Secondary | ICD-10-CM

## 2023-05-19 DIAGNOSIS — Z9889 Other specified postprocedural states: Secondary | ICD-10-CM | POA: Insufficient documentation

## 2023-05-19 DIAGNOSIS — I7121 Aneurysm of the ascending aorta, without rupture: Secondary | ICD-10-CM | POA: Diagnosis not present

## 2023-05-19 DIAGNOSIS — Z8679 Personal history of other diseases of the circulatory system: Secondary | ICD-10-CM | POA: Insufficient documentation

## 2023-05-19 DIAGNOSIS — I5033 Acute on chronic diastolic (congestive) heart failure: Secondary | ICD-10-CM | POA: Diagnosis present

## 2023-05-19 DIAGNOSIS — I771 Stricture of artery: Secondary | ICD-10-CM | POA: Diagnosis not present

## 2023-05-19 DIAGNOSIS — Z7984 Long term (current) use of oral hypoglycemic drugs: Secondary | ICD-10-CM | POA: Diagnosis not present

## 2023-05-19 DIAGNOSIS — I5030 Unspecified diastolic (congestive) heart failure: Secondary | ICD-10-CM | POA: Diagnosis present

## 2023-05-19 DIAGNOSIS — R7989 Other specified abnormal findings of blood chemistry: Secondary | ICD-10-CM | POA: Insufficient documentation

## 2023-05-19 DIAGNOSIS — I5032 Chronic diastolic (congestive) heart failure: Secondary | ICD-10-CM | POA: Diagnosis not present

## 2023-05-19 DIAGNOSIS — I4891 Unspecified atrial fibrillation: Secondary | ICD-10-CM | POA: Diagnosis not present

## 2023-05-19 DIAGNOSIS — K219 Gastro-esophageal reflux disease without esophagitis: Secondary | ICD-10-CM | POA: Diagnosis not present

## 2023-05-19 DIAGNOSIS — G250 Essential tremor: Secondary | ICD-10-CM | POA: Diagnosis present

## 2023-05-19 DIAGNOSIS — I10 Essential (primary) hypertension: Secondary | ICD-10-CM | POA: Diagnosis present

## 2023-05-19 DIAGNOSIS — I700 Atherosclerosis of aorta: Secondary | ICD-10-CM | POA: Diagnosis not present

## 2023-05-19 DIAGNOSIS — G609 Hereditary and idiopathic neuropathy, unspecified: Secondary | ICD-10-CM | POA: Diagnosis present

## 2023-05-19 DIAGNOSIS — R000 Tachycardia, unspecified: Secondary | ICD-10-CM | POA: Diagnosis not present

## 2023-05-19 DIAGNOSIS — K5730 Diverticulosis of large intestine without perforation or abscess without bleeding: Secondary | ICD-10-CM | POA: Diagnosis not present

## 2023-05-19 DIAGNOSIS — R079 Chest pain, unspecified: Secondary | ICD-10-CM | POA: Diagnosis not present

## 2023-05-19 DIAGNOSIS — E119 Type 2 diabetes mellitus without complications: Secondary | ICD-10-CM

## 2023-05-19 LAB — URINALYSIS, ROUTINE W REFLEX MICROSCOPIC
Bacteria, UA: NONE SEEN
Bilirubin Urine: NEGATIVE
Glucose, UA: NEGATIVE mg/dL
Hgb urine dipstick: NEGATIVE
Ketones, ur: 20 mg/dL — AB
Leukocytes,Ua: NEGATIVE
Nitrite: NEGATIVE
Protein, ur: 100 mg/dL — AB
Specific Gravity, Urine: 1.032 — ABNORMAL HIGH (ref 1.005–1.030)
pH: 6 (ref 5.0–8.0)

## 2023-05-19 LAB — TROPONIN I (HIGH SENSITIVITY)
Troponin I (High Sensitivity): 83 ng/L — ABNORMAL HIGH (ref <18)
Troponin I (High Sensitivity): 151 ng/L (ref <18)
Troponin I (High Sensitivity): 411 ng/L (ref <18)
Troponin I (High Sensitivity): 527 ng/L (ref <18)

## 2023-05-19 LAB — COMPREHENSIVE METABOLIC PANEL WITH GFR
ALT: 13 U/L (ref 0–44)
AST: 19 U/L (ref 15–41)
Albumin: 3.7 g/dL (ref 3.5–5.0)
Alkaline Phosphatase: 69 U/L (ref 38–126)
Anion gap: 13 (ref 5–15)
BUN: 20 mg/dL (ref 8–23)
CO2: 26 mmol/L (ref 22–32)
Calcium: 9 mg/dL (ref 8.9–10.3)
Chloride: 104 mmol/L (ref 98–111)
Creatinine, Ser: 0.92 mg/dL (ref 0.44–1.00)
GFR, Estimated: 60 mL/min — ABNORMAL LOW (ref >60)
Glucose, Bld: 171 mg/dL — ABNORMAL HIGH (ref 70–99)
Potassium: 3.5 mmol/L (ref 3.5–5.1)
Sodium: 143 mmol/L (ref 135–145)
Total Bilirubin: 0.7 mg/dL (ref 0.0–1.2)
Total Protein: 7.2 g/dL (ref 6.5–8.1)

## 2023-05-19 LAB — CBC WITH DIFFERENTIAL/PLATELET
Abs Immature Granulocytes: 0.03 10*3/uL (ref 0.00–0.07)
Basophils Absolute: 0 10*3/uL (ref 0.0–0.1)
Basophils Relative: 0 %
Eosinophils Absolute: 0 10*3/uL (ref 0.0–0.5)
Eosinophils Relative: 0 %
HCT: 37.8 % (ref 36.0–46.0)
Hemoglobin: 12.1 g/dL (ref 12.0–15.0)
Immature Granulocytes: 0 %
Lymphocytes Relative: 14 %
Lymphs Abs: 1.7 10*3/uL (ref 0.7–4.0)
MCH: 25.2 pg — ABNORMAL LOW (ref 26.0–34.0)
MCHC: 32 g/dL (ref 30.0–36.0)
MCV: 78.8 fL — ABNORMAL LOW (ref 80.0–100.0)
Monocytes Absolute: 0.4 10*3/uL (ref 0.1–1.0)
Monocytes Relative: 3 %
Neutro Abs: 9.6 10*3/uL — ABNORMAL HIGH (ref 1.7–7.7)
Neutrophils Relative %: 83 %
Platelets: 222 10*3/uL (ref 150–400)
RBC: 4.8 MIL/uL (ref 3.87–5.11)
RDW: 15.9 % — ABNORMAL HIGH (ref 11.5–15.5)
WBC: 11.8 10*3/uL — ABNORMAL HIGH (ref 4.0–10.5)
nRBC: 0 % (ref 0.0–0.2)

## 2023-05-19 LAB — MAGNESIUM: Magnesium: 1.5 mg/dL — ABNORMAL LOW (ref 1.7–2.4)

## 2023-05-19 LAB — HEMOGLOBIN A1C
Hgb A1c MFr Bld: 6.6 % — ABNORMAL HIGH (ref 4.8–5.6)
Mean Plasma Glucose: 142.72 mg/dL

## 2023-05-19 LAB — LIPASE, BLOOD: Lipase: 22 U/L (ref 11–51)

## 2023-05-19 LAB — GLUCOSE, CAPILLARY: Glucose-Capillary: 166 mg/dL — ABNORMAL HIGH (ref 70–99)

## 2023-05-19 LAB — TSH: TSH: 0.462 u[IU]/mL (ref 0.350–4.500)

## 2023-05-19 MED ORDER — ORAL CARE MOUTH RINSE: 15.0000 mL | OROMUCOSAL | Status: DC | PRN

## 2023-05-19 MED ORDER — PANTOPRAZOLE SODIUM 40 MG PO TBEC
40.0000 mg | DELAYED_RELEASE_TABLET | Freq: Every day | ORAL | Status: DC
  Administered 2023-05-20: 40 mg via ORAL
  Filled 2023-05-19: qty 1

## 2023-05-19 MED ORDER — MAGNESIUM SULFATE 2 GM/50ML IV SOLN
2.0000 g | INTRAVENOUS | Status: AC
  Administered 2023-05-19: 2 g via INTRAVENOUS
  Filled 2023-05-19: qty 50

## 2023-05-19 MED ORDER — DILTIAZEM LOAD VIA INFUSION
20.0000 mg | Freq: Once | INTRAVENOUS | Status: AC
  Administered 2023-05-19: 20 mg via INTRAVENOUS
  Filled 2023-05-19: qty 20

## 2023-05-19 MED ORDER — IOHEXOL 350 MG/ML SOLN
100.0000 mL | Freq: Once | INTRAVENOUS | Status: AC | PRN
  Administered 2023-05-19: 100 mL via INTRAVENOUS

## 2023-05-19 MED ORDER — INSULIN ASPART 100 UNIT/ML IJ SOLN
0.0000 [IU] | Freq: Three times a day (TID) | INTRAMUSCULAR | Status: DC
  Administered 2023-05-20: 1 [IU] via SUBCUTANEOUS

## 2023-05-19 MED ORDER — APIXABAN 5 MG PO TABS
5.0000 mg | ORAL_TABLET | Freq: Two times a day (BID) | ORAL | Status: DC
  Administered 2023-05-19 – 2023-05-20 (×2): 5 mg via ORAL
  Filled 2023-05-19 (×2): qty 1

## 2023-05-19 MED ORDER — ONDANSETRON HCL 4 MG/2ML IJ SOLN: 4.0000 mg | Freq: Once | INTRAMUSCULAR | Status: DC

## 2023-05-19 MED ORDER — SODIUM CHLORIDE 0.9 % IV SOLN: INTRAVENOUS | Status: AC

## 2023-05-19 MED ORDER — ACETAMINOPHEN 325 MG PO TABS
650.0000 mg | ORAL_TABLET | ORAL | Status: DC | PRN
  Administered 2023-05-19: 650 mg via ORAL
  Filled 2023-05-19: qty 2

## 2023-05-19 MED ORDER — INSULIN GLARGINE-YFGN 100 UNIT/ML ~~LOC~~ SOLN
10.0000 [IU] | Freq: Every day | SUBCUTANEOUS | Status: DC
  Administered 2023-05-20: 10 [IU] via SUBCUTANEOUS
  Filled 2023-05-19: qty 0.1

## 2023-05-19 MED ORDER — ONDANSETRON HCL 4 MG/2ML IJ SOLN
4.0000 mg | Freq: Once | INTRAMUSCULAR | Status: AC
  Administered 2023-05-19: 4 mg via INTRAVENOUS
  Filled 2023-05-19: qty 2

## 2023-05-19 MED ORDER — FENTANYL CITRATE PF 50 MCG/ML IJ SOSY
50.0000 ug | PREFILLED_SYRINGE | Freq: Once | INTRAMUSCULAR | Status: AC
  Administered 2023-05-19: 50 ug via INTRAVENOUS
  Filled 2023-05-19: qty 1

## 2023-05-19 MED ORDER — METOPROLOL TARTRATE 12.5 MG HALF TABLET
12.5000 mg | ORAL_TABLET | Freq: Two times a day (BID) | ORAL | Status: DC
  Administered 2023-05-19 – 2023-05-20 (×2): 12.5 mg via ORAL
  Filled 2023-05-19 (×2): qty 1

## 2023-05-19 MED ORDER — ONDANSETRON HCL 4 MG/2ML IJ SOLN
4.0000 mg | Freq: Four times a day (QID) | INTRAMUSCULAR | Status: DC | PRN
Start: 2023-05-19 — End: 2023-05-20
  Administered 2023-05-20 (×2): 4 mg via INTRAVENOUS
  Filled 2023-05-19 (×2): qty 2

## 2023-05-19 MED ORDER — DILTIAZEM HCL-DEXTROSE 125-5 MG/125ML-% IV SOLN (PREMIX)
5.0000 mg/h | INTRAVENOUS | Status: DC
  Administered 2023-05-19: 5 mg/h via INTRAVENOUS
  Filled 2023-05-19: qty 125

## 2023-05-19 MED ORDER — DILTIAZEM HCL 25 MG/5ML IV SOLN
20.0000 mg | Freq: Once | INTRAVENOUS | Status: AC
  Administered 2023-05-19: 20 mg via INTRAVENOUS
  Filled 2023-05-19: qty 5

## 2023-05-19 NOTE — ED Triage Notes (Addendum)
 From [name]. CP since am and pressure to center of chest with radiation to left arm. Afib 190, 20mg  IV Cardizem  and rate improved to 110s. Pt got ASA and 2 nitros.

## 2023-05-19 NOTE — Significant Event (Signed)
 Patient's nurse notified me that patient's troponin went up and it is around 527 now.  Patient is chest pain-free rhythm converted back to sinus rhythm initially presented with A-fib with RVR and chest pain presently chest pain-free.  EKG shows normal sinus rhythm heart rate around 57 bpm with nonspecific ST changes.  I discussed with on-call cardiologist Dr. Teofilo Fellers who advised trending cardiac markers and cardiologist feels that this is type II ischemia and if the troponin shows increasing trend to call back cardiology.  Checking limited LV function with 2D echo.  [name].

## 2023-05-19 NOTE — Assessment & Plan Note (Addendum)
 Continue metoprolol  12.5mg  BID Awaiting med rec to verify below meds Continue hydralazine  10mg  TID  Spironolactone  12.5mg 

## 2023-05-19 NOTE — ED Provider Notes (Signed)
Care significant.  Provider.  See note for full HPI.  In summation 89 year old history of A-fib here for evaluation of chest pain which began this morning.  Associated palpitations and shortness of breath.  Was found to be in A-fib with RVR.  Aspirin  nitro with EMS.  Initially had improvement with single dose of Cardizem .  Unfortunately returned back to A-fib with RVR.  She was started on Cardizem  drip.  She is pending a CT angio for dissection.  History of aneurysm.  Plan for admission. Physical Exam  BP (!) 129/53   Pulse (!) 104   Temp 99.1 F (37.3 C)   Resp (!) 25   Ht 5\' 4"  (1.626 m)   Wt 75.3 kg   SpO2 98%   BMI 28.49 kg/m   Physical Exam Vitals and nursing note reviewed.  Constitutional:      General: She is not in acute distress.    Appearance: She is well-developed. She is ill-appearing. She is not toxic-appearing or diaphoretic.  HENT:     Head: Atraumatic.  Eyes:     Pupils: Pupils are equal, round, and reactive to light.  Cardiovascular:     Rate and Rhythm: Tachycardia present. Rhythm irregular.     Pulses:          Radial pulses are 2+ on the right side and 2+ on the left side.       Dorsalis pedis pulses are 2+ on the right side and 2+ on the left side.     Heart sounds: Normal heart sounds.  Pulmonary:     Effort: Pulmonary effort is normal. No respiratory distress.     Breath sounds: Normal breath sounds.  Abdominal:     General: Bowel sounds are normal. There is no distension.     Palpations: Abdomen is soft.     Tenderness: There is abdominal tenderness.  Musculoskeletal:        General: Normal range of motion.     Cervical back: Normal range of motion.     Right lower leg: No tenderness. No edema.     Left lower leg: No tenderness. No edema.  Skin:    General: Skin is warm and dry.     Capillary Refill: Capillary refill takes less than 2 seconds.  Neurological:     General: No focal deficit present.     Mental Status: She is alert.  Psychiatric:         Mood and Affect: Mood normal.     Procedures  .Critical Care  Performed by: Dickson Founds, PA-C Authorized by: Dickson Founds, PA-C   Critical care provider statement:    Critical care time (minutes):  34   Critical care was necessary to treat or prevent imminent or life-threatening deterioration of the following conditions:  Cardiac failure   Critical care was time spent personally by me on the following activities:  Development of treatment plan with patient or surrogate, discussions with consultants, evaluation of patient's response to treatment, examination of patient, ordering and review of laboratory studies, ordering and review of radiographic studies, ordering and performing treatments and interventions, pulse oximetry, re-evaluation of patient's condition and review of old charts   ED Course / MDM  Care significant.  Provider.  See note for full HPI.  In summation 89 year old history of A-fib here for evaluation of chest pain which began this morning.  Associated palpitations and shortness of breath.  Was found to be in A-fib with RVR.  Aspirin  nitro  with EMS.  Initially had improvement with single dose of Cardizem .  Unfortunately returned back to A-fib with RVR.  She was started on Cardizem  drip.  She is pending a CT angio for dissection.  Plan for admission.  Imaging personally viewed and interpreted: Trop 83--151>> she denies any chest pain, shortness of breath currently.  Does have some palpitations.  Suspect demand. CT angio without any acute abnormality Metabolic panel shows potassium 3.5, mag 1.5 started on magnesium  supplementation  Patient reassessed.  Heart rate improved with Cardizem .  Has had some intermittent nausea and vomiting.  Will admit for further management and workup.  I updated patient's family.  Patient reassessed.  Heart rate improved to 110, no current emesis.  I discussed with Dr. Francesco Inks with [facility] who agrees to evaluate patient for  admission.   Patient be admitted for A-fib with RVR, elevated troponins she is currently chest pain-free, I do suspect her elevated troponins are likely due to demand from her A-fib with RVR.  Considered PE however low suspicion for this at this time.  No cough, fever to suggest URI.  Low suspicion for sepsis.  Heart score 5 CHA2DS2-VASc 6 already anticoagulated   The patient appears reasonably stabilized for admission considering the current resources, flow, and capabilities available in the ED at this time, and I doubt any other [facility] requiring further screening and/or treatment in the ED prior to admission.    Clinical Course as of 05/19/23 1839  Fri May 19, 2023  1322 Troponin I (High Sensitivity)(!): 83 [AH]  1323 WBC(!): 11.8 And asking for abdominal pain medicine.  I have ordered this.  Troponin is elevated likely demand ischemia.  Patient's heart rate improved significantly but has rebounded.  Will add in a load and infusion [AH]  1422 Troponin I (High Sensitivity)(!!): 151 [AH]  1448 DG Chest 1 View [AH]  1518 Will need admission, Afib with RVR, CP, N/V/D today, some abd pain. Given single dose Cardizem  with improvement however returned, trop up here. Getting Dissection study.  DNR. Mag low getting supplement, on Cardizem  drip. On Eliquis .  [BH]  1539 Pulse Rate: 100 Patient's pulmonary seems to have responded well to Cardizem  drip and bolus. [AH]  1613 Reassessed heart rate 110.  She feels improved.  She states she did have an episode of emesis we will give Zofran , plan for admission.  She is anticoagulated denies any missed doses, we did discuss cardioversion however patient declines. [BH]  1655 Discussed with patient daughter Deedee Lybarger.  She is agreeable with admission.  It does list patient's husband as her emergency contact however he is currently in hospice.  Would need to call patient's daughter back if any change. [BH]  1716 HR 108 on recheck [BH]  1811 Dr. Francesco Inks with [facility]  [BH]    Clinical Course User Index [AH] Harris, Abigail, PA-C [BH] Calen Posch A, PA-C   Medical Decision Making Amount and/or Complexity of Data Reviewed Independent Historian:     Details: Daughter via telephone External Data Reviewed: labs, radiology, ECG and notes. Labs: ordered. Decision-making details documented in ED Course. Radiology: ordered and independent interpretation performed. Decision-making details documented in ED Course. ECG/medicine tests: ordered and independent interpretation performed. Decision-making details documented in ED Course.  Risk OTC drugs. Prescription drug management. Parenteral controlled substances. Decision regarding hospitalization. Diagnosis or treatment significantly limited by social determinants of health.          Dickson Founds, PA-C 05/19/23 1839    Onetha Bile, MD  05/25/23 1627  

## 2023-05-19 NOTE — ED Notes (Signed)
 Report called to [facility], Pt going to [id number].

## 2023-05-19 NOTE — Assessment & Plan Note (Signed)
Replete and trend 

## 2023-05-19 NOTE — H&P (Signed)
 History and Physical    Patient: [name]:[id number] DOB: [birth year] DOA: 05/19/2023 DOS: the patient was seen and examined on 05/19/2023 PCP: [facility]  Patient coming from: [place]: [name]- uses walker and cane to ambulate    Chief Complaint: chest pain and shortness of breath and vomiting and diarrhea   HPI: [name] is a 89 y.o. female with medical history significant of HTN, HLD, AAA, anxiety, HFpEF, T2DM, hx of cerebral aneurysm repair s/p coiling, peripheral neuropathy, GERD who presented to ED with complaints of chest pain and shortness of breath. She is really stressed right now as her husband is in hospice. She states this morning she had diarrhea and vomiting and started to have chest pain and palpitations. She continued to have diarrhea and vomiting. She has not had any more diarrhea since being in ED, but has had some vomiting episode. She had pain in her epigastric area, but it's better. No fever or chills at home. No other sick contacts.   Denies any fever/chills, vision changes/headaches, cough, abdominal pain, dysuria. She has had increased leg swelling (has edematous legs at baseline). No weight gain.   Admitted in 12/2021 for atrial fib with RVR and elevated troponin's. Cardiology suspected demand ischemia in setting of new atrial fibrillation. She did not want invasive procedure. Hospitalized in January 2024 for atrial fibrillation.   ER Course:  vitals: afebrile, bp: 152/81, HR; 119, RR: 18, oxygen: 92%RA Pertinent labs: wbc: 11.8, troponin 83>151, magnesium : 1.5,  CXR: no acute finding CTA/abdomen/chest/pelvic: No evidence of dissection or other acute aortic pathology. 2. Unchanged enlargement of the tubular ascending thoracic aorta, measuring up to 4.3 x 4.2 cm in caliber. Normal caliber of the distal arch and descending thoracic aorta, as well as the abdominal aorta. 3. Cardiomegaly and coronary artery disease. 4. Enlargement of the  main pulmonary artery, as can be seen in pulmonary hypertension. 5. Hepatic steatosis. 6. Descending and sigmoid diverticulosis without evidence of acute diverticulitis. In ED: cardizem  gtt started. Magnesium  repleted. [facility] asked to admit.    Review of Systems: As mentioned in the history of present illness. All other systems reviewed and are negative. Past Medical History:  Diagnosis Date   Abdominal pain, chronic, left lower quadrant    Acute torn meniscus of knee    Adenomatous colon polyp 1970   carcinoma in situ   Allergic rhinitis    Amaurosis fugax 08/07/2012   Anxiety    Ascending aortic aneurysm (HCC) 12/07/2015   44mm by chest CTA 08/2020   Benign essential tremor syndrome    Bicuspid aortic valve    no AS on 07/2019   Carotid stenosis    1039 bilateral by dopplers 03/2017.    colon ca dx'd 1970   surg only   Coronary artery calcification seen on CT scan 09/26/2022   - Cath in 2002 normal - Myoview in 2017 low risk - Admx in 12/2021 w AF w RVR and elevated hsT c/w NSTEMI - pt preferred conservative mgmt (no cath)    DDD (degenerative disc disease)    Diverticulitis    Diverticulosis    DM (diabetes mellitus) (HCC)    Duodenitis    peptic, with gastric heterotopia   Endometriosis    s/p hysterectomy   Fundic gland polyposis of stomach    GERD (gastroesophageal reflux disease)    Heart murmur    Hiatal hernia 02/03/2005   History of cerebral aneurysm repair    s/p coiling   History  of hemorrhoids    with bleeding   History of shingles    HLD (hyperlipidemia)    HTN (hypertension)    Hypercholesteremia    IBS (irritable bowel syndrome)    Iron deficiency    Lung nodule 07/12/2021   CT 05/2021: 3 mm right solid pulmonary nodule-no routine follow-up imaging recommended   Migraine    MVP (mitral valve prolapse)    Osteopenia    Peripheral neuropathy    Toe fracture, right    second toe   UTI (lower urinary tract infection)    Varicose vein    Past  Surgical History:  Procedure Laterality Date   ABDOMINAL HYSTERECTOMY     APPENDECTOMY     CARPAL TUNNEL RELEASE  08/26/07   CATARACT EXTRACTION     CEREBRAL ANEURYSM REPAIR     COLON RESECTION     COLONOSCOPY  06/07/08   divertiulosis, internal hemorrhoids   COLONOSCOPY W/ BIOPSIES AND POLYPECTOMY  02/03/05   diverticulosis, 4 mm sessile polyps, internal and external hemorrhoids   CORONARY ANGIOPLASTY WITH STENT PLACEMENT     ESOPHAGOGASTRODUODENOSCOPY  02/03/05   hiatal hernia, 6 benign gastric polyps   FLEXIBLE SIGMOIDOSCOPY     HAND SURGERY Right    INTRAOCULAR LENS INSERTION     right hand decompressive fasciotomy  08/26/07   , dorsal and volar   TONSILLECTOMY     Social History:  reports that she has never smoked. She has never used smokeless tobacco. She reports that she does not drink alcohol and does not use drugs.  Allergies  Allergen Reactions   Actos [Pioglitazone] Other (See Comments)    Chronic pedal edema   Amoxil  [Amoxicillin ] Other (See Comments)    Stomach upset   Avandia [Rosiglitazone] Other (See Comments)    Chronic pedal edema   Fosamax [Alendronate Sodium] Other (See Comments)    Unknown reaction   Ms Contin  [Morphine ] Other (See Comments)    Body spasms    Ultram  [Tramadol ] Shortness Of Breath and Palpitations   Amaryl [Glimepiride] Other (See Comments)    Unknown reaction   Januvia [Sitagliptin] Other (See Comments)    Unknown reaction   Lipitor [Atorvastatin] Swelling    Myalgias    Metformin And Related Other (See Comments)    Gastritis    Prandin [Repaglinide] Other (See Comments)    Abdominal pain   Tradjenta [Linagliptin] Other (See Comments)    GI issues   Zoloft [Sertraline Hcl] Other (See Comments)    Altered mental state   Scientist, forensic ] Other (See Comments)    Bleeding ulcers    Bactrim [Sulfamethoxazole-Trimethoprim] Hives   Bentyl [Dicyclomine Hcl] Other (See Comments)    Near syncope Weakness    Cipro  [Ciprofloxacin  Hcl] Nausea And  Vomiting and Other (See Comments)    Stomach upset   Hctz [Hydrochlorothiazide ] Other (See Comments)    Urinary issues   Keflex [Cephalexin] Other (See Comments)    Unknown reaction   Morphine  And Codeine Other (See Comments)    Body spasms   Norvasc  [Amlodipine  Besylate] Other (See Comments)    Weakness Dizziness    Oxycontin [Oxycodone] Other (See Comments)    Unknown reaction   Sulfa Antibiotics Hives   Penicillins Swelling and Rash    Family History  Problem Relation Age of Onset   Ovarian cancer Mother    Heart disease Father    Kidney disease Father    Diabetes Paternal Grandmother    Diabetes Brother    Hypertension  Brother    Heart disease Brother    Diabetes Brother    Heart disease Brother    Microcephaly Brother    Diabetes Other    Colon cancer Other     Prior to Admission medications   Medication Sig Start Date End Date Taking? Authorizing Provider  ACCU-CHEK GUIDE TEST test strip 1 each by Other route daily. 03/20/23   [provider]  acetaminophen  (TYLENOL ) 500 MG tablet Take 500 mg by mouth every 6 (six) hours as needed for mild pain, moderate pain, fever or headache.    [provider]  ALPRAZolam  (XANAX ) 0.5 MG tablet Take 1-2 tablets (0.5-1 mg total) by mouth 2 (two) times daily as needed for anxiety. Patient not taking: Reported on 02/14/2023 02/18/22   Krishnan, Gokul, MD  amiodarone  (PACERONE ) 200 MG tablet Take 200mg  twice daily until 02/23/22 and then take 200mg  once daily Patient not taking: Reported on 02/14/2023 02/18/22   Krishnan, Gokul, MD  apixaban  (ELIQUIS ) 5 MG TABS tablet Take 1 tablet (5 mg total) by mouth 2 (two) times daily. 12/05/22   Jacqueline Matsu, MD  furosemide  (LASIX ) 20 MG tablet TAKE ONE TAB EVERY MON, WED, FRI. TAKE ONE ADDITIONAL TAB IF WEIGHT GAIN OF 3+ LBS OVERNIGHT 12/05/22   Turner, Rufus Council, MD  hydrALAZINE  (APRESOLINE ) 10 MG tablet TAKE 1 TABLET BY MOUTH THREE TIMES A DAY 02/20/23   Jacqueline Matsu, MD   hydrocortisone  (ANUSOL -HC) 2.5 % rectal cream Apply to perianal area 3 times daily. 11/14/17   Zehr, Jessica D, PA-C  insulin  glargine (LANTUS ) 100 UNIT/ML Solostar Pen Inject 5 Units into the skin in the morning. 02/18/22   Krishnan, Gokul, MD  loperamide (IMODIUM A-D) 2 MG tablet Take 2 mg by mouth. 05/11/23   [provider]  metoprolol  tartrate (LOPRESSOR ) 25 MG tablet Take 0.5 tablets (12.5 mg total) by mouth 2 (two) times daily. 02/18/22   Krishnan, Gokul, MD  Multiple Vitamin (MULTIVITAMIN) tablet Take 1 tablet by mouth in the morning.    [provider]  ondansetron  (ZOFRAN ) 4 MG tablet Take 1 tablet (4 mg total) by mouth every 8 (eight) hours as needed for nausea or vomiting. 02/18/22   Krishnan, Gokul, MD  pantoprazole  (PROTONIX ) 40 MG tablet TAKE 1 TABLET BY MOUTH EVERY DAY BEFORE BREAKFAST 02/03/20   Kenney Peacemaker, MD  polyethylene glycol (MIRALAX  / GLYCOLAX ) 17 g packet Take 17 g by mouth 2 (two) times daily. Patient not taking: Reported on 02/14/2023 02/18/22   Krishnan, Gokul, MD  spironolactone  (ALDACTONE ) 25 MG tablet TAKE 1/2 TABLET BY MOUTH DAILY Patient taking differently: Take 12.5 mg by mouth daily. Tues, thurs, and sat 12/05/22   Jacqueline Matsu, MD    Physical Exam: Vitals:   05/19/23 1430 05/19/23 1606 05/19/23 1745 05/19/23 1900  BP: (!) 142/66 (!) 145/73 (!) 129/53   Pulse: 100 (!) 140 (!) 104   Resp: (!) 28 (!) 24 (!) 25   Temp:    98 F (36.7 C)  TempSrc:    Oral  SpO2: 98% 97% 98%   Weight:      Height:       General:  Appears calm and comfortable and is in NAD Eyes:  PERRL, EOMI, normal lids, iris ENT: HOH, lips & tongue, mmm; appropriate dentition Neck:  no LAD, masses or thyromegaly; no carotid bruits Cardiovascular:  RRR, no m/r/g. 1-2+ LE edema.  Respiratory:   CTA bilaterally with no wheezes/rales/rhonchi.  Normal respiratory effort. Abdomen:  soft, NT, ND, NABS Back:   normal alignment, no CVAT Skin:  no rash or induration seen on  limited exam Musculoskeletal:  grossly normal tone BUE/BLE, good ROM, no bony abnormality Lower extremity:  No LE edema.  Limited foot exam with no ulcerations.  2+ distal pulses. Psychiatric:  grossly normal mood and affect, speech fluent and appropriate, AOx3 Neurologic:  CN 2-12 grossly intact, moves all extremities in coordinated fashion, sensation intact   Radiological Exams on Admission: Independently reviewed - see discussion in A/P where applicable  CT Angio Chest/Abd/Pel for Dissection W and/or Wo Contrast Result Date: 05/19/2023 CLINICAL DATA:  Chest pain, emesis, aortic aneurysm suspected EXAM: CT ANGIOGRAPHY CHEST, ABDOMEN AND PELVIS TECHNIQUE: Non-contrast CT of the chest was initially obtained. Multidetector CT imaging through the chest, abdomen and pelvis was performed using the standard protocol during bolus administration of intravenous contrast. Multiplanar reconstructed images and MIPs were obtained and reviewed to evaluate the vascular anatomy. RADIATION DOSE REDUCTION: This exam was performed according to the departmental dose-optimization program which includes automated exposure control, adjustment of the mA and/or kV according to patient size and/or use of iterative reconstruction technique. CONTRAST:  OMNIPAQUE  IOHEXOL  350 MG/ML SOLN COMPARISON:  CT chest, 02/14/2023 FINDINGS: CTA CHEST FINDINGS VASCULAR Aorta: Satisfactory opacification of the aorta. Unchanged enlargement of the tubular ascending thoracic aorta, measuring up to 4.3 x 4.2 cm in caliber. Normal caliber of the distal arch and descending thoracic aorta. Moderate mixed calcific atherosclerosis. No evidence of dissection or other acute aortic pathology. Cardiovascular: No evidence of pulmonary embolism on limited non-tailored examination. Cardiomegaly. Left coronary artery calcifications. Enlargement of the main pulmonary artery measuring up to 3.5 cm in caliber. No pericardial effusion. Review of the MIP images  confirms the above findings. NON VASCULAR Mediastinum/Nodes: No enlarged mediastinal, hilar, or axillary lymph nodes. Thyroid  gland, trachea, and esophagus demonstrate no significant findings. Lungs/Pleura: Mild bibasilar scarring or atelectasis. No pleural effusion or pneumothorax. Musculoskeletal: No chest wall abnormality. No acute osseous findings. Review of the MIP images confirms the above findings. CTA ABDOMEN AND PELVIS FINDINGS VASCULAR Normal contour and caliber of the abdominal aorta. No evidence of aneurysm, dissection, or other acute aortic pathology. Standard branching pattern of the abdominal aorta with solitary bilateral renal arteries. Mild mixed calcific atherosclerosis. Review of the MIP images confirms the above findings. NON-VASCULAR Hepatobiliary: No solid liver abnormality is seen. Hepatic steatosis. No gallstones, gallbladder wall thickening, or biliary dilatation. Pancreas: Unremarkable. No pancreatic ductal dilatation or surrounding inflammatory changes. Spleen: Normal in size without significant abnormality. Adrenals/Urinary Tract: Adrenal glands are unremarkable. Kidneys are normal, without renal calculi, solid lesion, or hydronephrosis. Bladder is unremarkable. Stomach/Bowel: Stomach is within normal limits. Appendix not clearly visualized and may be surgically absent. No evidence of bowel wall thickening, distention, or inflammatory changes. Descending and sigmoid diverticulosis. Lymphatic: No enlarged abdominal or pelvic lymph nodes. Reproductive: Status post hysterectomy. Other: No abdominal wall hernia or abnormality. No ascites. Musculoskeletal: No acute osseous findings. IMPRESSION: 1. No evidence of dissection or other acute aortic pathology. 2. Unchanged enlargement of the tubular ascending thoracic aorta, measuring up to 4.3 x 4.2 cm in caliber. Normal caliber of the distal arch and descending thoracic aorta, as well as the abdominal aorta. 3. Cardiomegaly and coronary artery  disease. 4. Enlargement of the main pulmonary artery, as can be seen in pulmonary hypertension. 5. Hepatic steatosis. 6. Descending and sigmoid diverticulosis without evidence of acute diverticulitis. Aortic Atherosclerosis (ICD10-I70.0). Electronically Signed   By: Fredricka Jenny  M.D.   On: 05/19/2023 15:41   DG Chest 1 View Result Date: 05/19/2023 CLINICAL DATA:  [id number] Chest pain [id number] EXAM: CHEST  1 VIEW COMPARISON:  February 14, 2023 FINDINGS: No focal airspace consolidation, pleural effusion, or pneumothorax. Mild cardiomegaly. Tortuous aorta with aortic atherosclerosis. No acute fracture or destructive lesions. Multilevel thoracic osteophytosis. IMPRESSION: No acute cardiopulmonary abnormality. Electronically Signed   By: Rance Burrows M.D.   On: 05/19/2023 12:03    EKG: Independently reviewed.  Atrial fibrillation with rate 141; nonspecific ST changes with no evidence of acute ischemia   Labs on Admission: I have personally reviewed the available labs and imaging studies at the time of the admission.  Pertinent labs:   wbc: 11.8,  troponin 83>151,  magnesium : 1.5  Assessment and Plan: Principal Problem:   Atrial fibrillation with RVR (HCC) Active Problems:   Elevated troponin   Gastroenteritis   Hypomagnesemia   Essential hypertension   DM (diabetes mellitus) (HCC)   (HFpEF) heart failure with preserved ejection fraction (HCC)   Ascending aortic aneurysm (HCC)   GERD    Assessment and Plan: * Atrial fibrillation with RVR (HCC) 89 year old with known atrial fibrillation presented to ED with complaints of chest pain and shortness of breath in setting of gastroenteritis found to be in atrial fibrillation with RVR -obs to tele -started on cardizem  gtt and has converted to NSR with rate of 77 -start back her metoprolol  and will stop cardizem   -replete magnesium , check TSH  -repeat echo (worsening leg swelling/elevated troponin) -she states chest pain and shortness of breath  have resolved -troponin elevated, but likely demand ischemia in setting of RVR. Continue to cycle troponin -continue eliquis    Elevated troponin Likely demand ischemia in setting of atrial fib with RVR Will continue to cycle troponin Echo pending  On tele   Gastroenteritis Onset today with vomiting and diarrhea Enteric precautions Check stool PCR Anti emetics PRN   Hypomagnesemia Replete and trend   Essential hypertension Continue metoprolol  12.5mg  BID Awaiting med rec to verify below meds Continue hydralazine  10mg  TID  Spironolactone  12.5mg    DM (diabetes mellitus) (HCC) A1C pending, has been well controlled in past  Continue long acting insulin   SSI and accuchecks QAC/HS   (HFpEF) heart failure with preserved ejection fraction (HCC) Euvolemic  Echo 12/2021: normal EF and grade 1 DD. Basal hypokinesis  Continue medical management  Strict I/O and daily weights    Ascending aortic aneurysm (HCC) CTA today shows unchanged AAA 4.3x4.2 Continue to monitor   GERD Continue PPI     Advance Care Planning:   Code Status: Limited: Do not attempt resuscitation (DNR) -DNR-LIMITED -Do Not Intubate/DNI    Consults: none   DVT Prophylaxis: eliquis    Family Communication: updated daughter, Damyia Strider, by phone.   Severity of Illness: The appropriate patient status for this patient is OBSERVATION. Observation status is judged to be reasonable and necessary in order to provide the required intensity of service to ensure the patient's safety. The patient's presenting symptoms, physical exam findings, and initial radiographic and laboratory data in the context of their medical condition is felt to place them at decreased risk for further clinical deterioration. Furthermore, it is anticipated that the patient will be medically stable for discharge from the hospital within 2 midnights of admission.   Author: Raymona Caldwell, MD 05/19/2023 7:16 PM  For on call review  [email or web address].

## 2023-05-19 NOTE — Assessment & Plan Note (Addendum)
 Euvolemic  Echo 12/2021: normal EF and grade 1 DD. Basal hypokinesis  Continue medical management  Strict I/O and daily weights

## 2023-05-19 NOTE — Assessment & Plan Note (Addendum)
 89 year old with known atrial fibrillation presented to ED with complaints of chest pain and shortness of breath in setting of gastroenteritis found to be in atrial fibrillation with RVR -obs to tele -started on cardizem  gtt and has converted to NSR with rate of 77 -start back her metoprolol  and will stop cardizem   -replete magnesium , check TSH  -repeat echo (worsening leg swelling/elevated troponin) -she states chest pain and shortness of breath have resolved -troponin elevated, but likely demand ischemia in setting of RVR. Continue to cycle troponin -continue eliquis 

## 2023-05-19 NOTE — Assessment & Plan Note (Signed)
 Likely demand ischemia in setting of atrial fib with RVR Will continue to cycle troponin Echo pending  On tele

## 2023-05-19 NOTE — Assessment & Plan Note (Signed)
 Continue PPI ?

## 2023-05-19 NOTE — Assessment & Plan Note (Signed)
 Onset today with vomiting and diarrhea Enteric precautions Check stool PCR Anti emetics PRN

## 2023-05-19 NOTE — Assessment & Plan Note (Addendum)
 A1C pending, has been well controlled in past  Continue long acting insulin   SSI and accuchecks QAC/HS

## 2023-05-19 NOTE — Assessment & Plan Note (Signed)
 CTA today shows unchanged AAA 4.3x4.2 Continue to monitor

## 2023-05-19 NOTE — ED Provider Notes (Signed)
 [place] EMERGENCY DEPARTMENT AT [facility] Provider Note   CSN: [id number] Arrival date & time: 05/19/23  1042     History  Chief Complaint  Patient presents with  . Chest Pain  . Emesis    [name] is a 89 y.o. female who presents emergency department with racing heart nausea vomiting and diarrhea.  She has a past medical history of a ascending thoracic aortic aneurysm, atrial fibrillation anticoagulated on Eliquis  and compliant with medications.  Reports that she began having multiple episodes of nausea vomiting and diarrhea beginning early this morning.  She is having some chest pain right at this time.  She reports her vomitus as yellow in her stool as brown and watery. She denies fevers or chills.   Chest Pain Associated symptoms: vomiting   Emesis      Home Medications Prior to Admission medications   Medication Sig Start Date End Date Taking? Authorizing Provider  acetaminophen  (TYLENOL ) 500 MG tablet Take 500 mg by mouth every 6 (six) hours as needed for mild pain, moderate pain, fever or headache.    [provider]  ALPRAZolam  (XANAX ) 0.5 MG tablet Take 1-2 tablets (0.5-1 mg total) by mouth 2 (two) times daily as needed for anxiety. Patient not taking: Reported on 02/14/2023 02/18/22   Maylene Spear, MD  amiodarone  (PACERONE ) 200 MG tablet Take 200mg  twice daily until 02/23/22 and then take 200mg  once daily Patient not taking: Reported on 02/14/2023 02/18/22   Krishnan, Gokul, MD  apixaban  (ELIQUIS ) 5 MG TABS tablet Take 1 tablet (5 mg total) by mouth 2 (two) times daily. 12/05/22   Jacqueline Matsu, MD  furosemide  (LASIX ) 20 MG tablet TAKE ONE TAB EVERY MON, WED, FRI. TAKE ONE ADDITIONAL TAB IF WEIGHT GAIN OF 3+ LBS OVERNIGHT 12/05/22   Turner, Rufus Council, MD  hydrALAZINE  (APRESOLINE ) 10 MG tablet TAKE 1 TABLET BY MOUTH THREE TIMES A DAY 02/20/23   Turner, Rufus Council, MD  hydrocortisone  (ANUSOL -HC) 2.5 % rectal cream Apply to perianal area 3 times  daily. 11/14/17   Zehr, Jessica D, PA-C  insulin  glargine (LANTUS ) 100 UNIT/ML Solostar Pen Inject 5 Units into the skin in the morning. 02/18/22   Krishnan, Gokul, MD  metoprolol  tartrate (LOPRESSOR ) 25 MG tablet Take 0.5 tablets (12.5 mg total) by mouth 2 (two) times daily. 02/18/22   Krishnan, Gokul, MD  Multiple Vitamin (MULTIVITAMIN) tablet Take 1 tablet by mouth in the morning.    [provider]  ondansetron  (ZOFRAN ) 4 MG tablet Take 1 tablet (4 mg total) by mouth every 8 (eight) hours as needed for nausea or vomiting. 02/18/22   Krishnan, Gokul, MD  pantoprazole  (PROTONIX ) 40 MG tablet TAKE 1 TABLET BY MOUTH EVERY DAY BEFORE BREAKFAST 02/03/20   Kenney Peacemaker, MD  polyethylene glycol (MIRALAX  / GLYCOLAX ) 17 g packet Take 17 g by mouth 2 (two) times daily. Patient not taking: Reported on 02/14/2023 02/18/22   Maylene Spear, MD  spironolactone  (ALDACTONE ) 25 MG tablet TAKE 1/2 TABLET BY MOUTH DAILY Patient taking differently: Take 12.5 mg by mouth daily. Tues, thurs, and sat 12/05/22   Jacqueline Matsu, MD      Allergies    Actos [pioglitazone], Amoxil  [amoxicillin ], Avandia [rosiglitazone], Fosamax [alendronate sodium], Ms contin  [morphine ], Ultram  [tramadol ], Amaryl [glimepiride], Januvia [sitagliptin], Lipitor [atorvastatin], Metformin and related, Prandin [repaglinide], Tradjenta [linagliptin], Zoloft [sertraline hcl], Asa [aspirin ], Bactrim [sulfamethoxazole-trimethoprim], Bentyl [dicyclomine hcl], Cipro  [ciprofloxacin  hcl], Hctz [hydrochlorothiazide ], Keflex [cephalexin], Morphine  and codeine, Norvasc  [amlodipine  besylate], Oxycontin [  oxycodone], Sulfa antibiotics, and Penicillins    Review of Systems   Review of Systems  Cardiovascular:  Positive for chest pain.  Gastrointestinal:  Positive for vomiting.    Physical Exam Updated Vital Signs BP (!) 163/91   Pulse (!) 149   Temp 99.1 F (37.3 C)   Resp (!) 21   Ht 5\' 4"  (1.626 m)   Wt 75.3 kg   SpO2 94%   BMI 28.49 kg/m   Physical Exam Vitals and nursing note reviewed.  Constitutional:      General: She is not in acute distress.    Appearance: She is well-developed. She is not diaphoretic.  HENT:     Head: Normocephalic and atraumatic.     Right Ear: External ear normal.     Left Ear: External ear normal.     Nose: Nose normal.     Mouth/Throat:     Mouth: Mucous membranes are moist.  Eyes:     General: No scleral icterus.    Conjunctiva/sclera: Conjunctivae normal.  Cardiovascular:     Rate and Rhythm: Tachycardia present. Rhythm irregularly irregular.     Heart sounds: Normal heart sounds. No murmur heard.    No friction rub. No gallop.  Pulmonary:     Effort: Pulmonary effort is normal. No respiratory distress.     Breath sounds: Normal breath sounds.  Abdominal:     General: Bowel sounds are normal. There is no distension.     Palpations: Abdomen is soft. There is no mass.     Tenderness: There is no abdominal tenderness. There is no guarding.  Musculoskeletal:     Cervical back: Normal range of motion.  Skin:    General: Skin is warm and dry.  Neurological:     Mental Status: She is alert and oriented to person, place, and time.  Psychiatric:        Behavior: Behavior normal.     ED Results / Procedures / Treatments   Labs (all labs ordered are listed, but only abnormal results are displayed) Labs Reviewed  COMPREHENSIVE METABOLIC PANEL WITH GFR  CBC WITH DIFFERENTIAL/PLATELET  MAGNESIUM   LIPASE, BLOOD  URINALYSIS, ROUTINE W REFLEX MICROSCOPIC  TROPONIN I (HIGH SENSITIVITY)    EKG EKG Interpretation Date/Time:  Friday May 19 2023 10:51:48 EDT Ventricular Rate:  141 PR Interval:    QRS Duration:  86 QT Interval:  286 QTC Calculation: 438 R Axis:   54  Text Interpretation: Atrial fibrillation with rapid ventricular response Cannot rule out Inferior infarct , age undetermined Cannot rule out Anterior infarct , age undetermined Marked ST abnormality, possible lateral  subendocardial injury Abnormal ECG When compared with ECG of 14-Feb-2023 16:41, PREVIOUS ECG IS PRESENT Confirmed by Abby Hocking [phone]) on 05/19/2023 10:56:31 AM  Radiology No results found.  Procedures .Critical Care  Performed by: Tama Fails, PA-C Authorized by: Tama Fails, PA-C   Critical care provider statement:    Critical care time (minutes):  75   Critical care time was exclusive of:  Separately billable procedures and treating other patients   Critical care was necessary to treat or prevent imminent or life-threatening deterioration of the following conditions:  Cardiac failure   Critical care was time spent personally by me on the following activities:  Development of treatment plan with patient or surrogate, discussions with consultants, evaluation of patient's response to treatment, examination of patient, ordering and review of laboratory studies, ordering and review of radiographic studies, ordering and performing treatments and interventions, pulse  oximetry, re-evaluation of patient's condition and review of old charts     Medications Ordered in ED Medications  diltiazem  (CARDIZEM ) injection 20 mg (has no administration in time range)    ED Course/ Medical Decision Making/ A&P Clinical Course as of 05/19/23 1812  Fri May 19, 2023  1322 Troponin I (High Sensitivity)(!): 83 [AH]  1323 WBC(!): 11.8 And asking for abdominal pain medicine.  I have ordered this.  Troponin is elevated likely demand ischemia.  Patient's heart rate improved significantly but has rebounded.  Will add in a load and infusion [AH]  1422 Troponin I (High Sensitivity)(!!): 151 [AH]  1448 DG Chest 1 View [AH]  1518 Will need admission, Afib with RVR, CP, N/V/D today, some abd pain. Given single dose Cardizem  with improvement however returned, trop up here. Getting Dissection study.  DNR. Mag low getting supplement, on Cardizem  drip. On Eliquis .  [BH]  1539 Pulse Rate: 100 Patient's pulmonary  seems to have responded well to Cardizem  drip and bolus. [AH]  1613 Reassessed heart rate 110.  She feels improved.  She states she did have an episode of emesis we will give Zofran , plan for admission.  She is anticoagulated denies any missed doses, we did discuss cardioversion however patient declines. [BH]  1655 Discussed with patient daughter [name].  She is agreeable with admission.  It does list patient's husband as her emergency contact however he is currently in hospice.  Would need to call patient's daughter back if any change. [BH]  1716 HR 108 on recheck [BH]  1811 Dr. Francesco Inks with [facility] [BH]    Clinical Course User Index [AH] Reniya Mcclees, PA-C [BH] Henderly, Britni A, PA-C                                 Medical Decision Making Amount and/or Complexity of Data Reviewed Labs: ordered. Decision-making details documented in ED Course. Radiology: ordered. Decision-making details documented in ED Course.  Risk Prescription drug management. Decision regarding hospitalization.   This patient presents to the ED for concern of cp and abdominal pain , this involves an extensive number of treatment options, and is a complaint that carries with it a high risk of complications and morbidity.  DDX includes ACS, demand ischemia, dissection, CAP, AAA, gastroenteritis, appendicitis, Bowel obstruction, Bowel perforation. Gastroparesis, DKA, Hernia, Inflammatory bowel disease, mesenteric ischemia, pancreatitis, peritonitis SBP, volvulus.   Co morbidities: .  has a past medical history of Abdominal pain, chronic, left lower quadrant, Acute torn meniscus of knee, Adenomatous colon polyp (1970), Allergic rhinitis, Amaurosis fugax (08/07/2012), Anxiety, Ascending aortic aneurysm (HCC) (12/07/2015), Benign essential tremor syndrome, Bicuspid aortic valve, Carotid stenosis, colon ca (dx'd 1970), Coronary artery calcification seen on CT scan (09/26/2022), DDD (degenerative disc disease),  Diverticulitis, Diverticulosis, DM (diabetes mellitus) (HCC), Duodenitis, Endometriosis, Fundic gland polyposis of stomach, GERD (gastroesophageal reflux disease), Heart murmur, Hiatal hernia (02/03/2005), History of cerebral aneurysm repair, History of hemorrhoids, History of shingles, HLD (hyperlipidemia), HTN (hypertension), Hypercholesteremia, IBS (irritable bowel syndrome), Iron deficiency, Lung nodule (07/12/2021), Migraine, MVP (mitral valve prolapse), Osteopenia, Peripheral neuropathy, Toe fracture, right, UTI (lower urinary tract infection), and Varicose vein.   Social Determinants of Health:  . SDOH Screenings   Food Insecurity: No Food Insecurity (02/05/2022)  Housing: Low Risk  (02/05/2022)  Transportation Needs: No Transportation Needs (02/05/2022)  Utilities: Not At Risk (02/05/2022)  Tobacco Use: Low Risk  (02/14/2023)     Additional history:  Additional history obtained from EMS triage    Lab Tests:  I Ordered, and personally interpreted labs.  The pertinent results include:   Troponin 83 <<151 likely   Imaging Studies:  I ordered imaging studies including CXR  I independently visualized and interpreted imaging which showed no acute findings I agree with the radiologist interpretation  CTA C/A/P pending  Cardiac Monitoring/ECG:  .The patient was maintained on a cardiac monitor.  I personally viewed and interpreted the cardiac monitored which showed an underlying rhythm of: Afib w rvr  Medicines ordered and prescription drug management:  I ordered medication including  Medications  diltiazem  (CARDIZEM ) 1 mg/mL load via infusion 20 mg (20 mg Intravenous Bolus from Bag 05/19/23 1438)    And  diltiazem  (CARDIZEM ) 125 mg in dextrose  5% 125 mL (1 mg/mL) infusion (10 mg/hr Intravenous Rate/Dose Change 05/19/23 1631)  ondansetron  (ZOFRAN ) injection 4 mg (has no administration in time range)  diltiazem  (CARDIZEM ) injection 20 mg (20 mg Intravenous Given 05/19/23 1159)   magnesium  sulfate IVPB 2 g 50 mL (0 g Intravenous Stopped 05/19/23 1444)  ondansetron  (ZOFRAN ) injection 4 mg (4 mg Intravenous Given 05/19/23 1328)  fentaNYL  (SUBLIMAZE ) injection 50 mcg (50 mcg Intravenous Given 05/19/23 1332)  iohexol  (OMNIPAQUE ) 350 MG/ML injection 100 mL (100 mLs Intravenous Contrast Given 05/19/23 1526)   for afib w rvr, abdominal pain Reevaluation of the patient after these medicines showed that the patient improved I have reviewed the patients home medicines and have made adjustments as needed  Test Considered:  .  Critical Interventions:  .Cardizem  load and infusion  Consultations Obtained:   Problem List / ED Course:  .No diagnosis found.  MDM: Patient with afib/rvr, elevated troponin likely due to demand ischemia. CTA pending S/o to PA Henderly Will need admission  .          Final Clinical Impression(s) / ED Diagnoses Final diagnoses:  None    Rx / DC Orders ED Discharge Orders          Ordered    Amb referral to AFIB Clinic        05/19/23 1133              Tama Fails, PA-C 05/19/23 1812    Flonnie Humphrey, DO 05/24/23 [phone]

## 2023-05-19 NOTE — ED Notes (Signed)
 Per lab, Troponin 151, [place], [place], notified.

## 2023-05-19 NOTE — ED Notes (Signed)
 Notified MD [facility] of patients increasing Troponin levels (SEE CHART), HR 55 BPM, Pt denies any chest pain at this time. Pt denies any cardiac stents when asked.

## 2023-05-19 NOTE — ED Notes (Addendum)
 Asked unit secretary to page cross cover MD, attempting to notify of increasing troponin, Charge nurse Sherline Distel aware of situation.

## 2023-05-20 ENCOUNTER — Observation Stay (HOSPITAL_COMMUNITY)

## 2023-05-20 DIAGNOSIS — I4891 Unspecified atrial fibrillation: Secondary | ICD-10-CM | POA: Diagnosis not present

## 2023-05-20 DIAGNOSIS — I10 Essential (primary) hypertension: Secondary | ICD-10-CM | POA: Diagnosis not present

## 2023-05-20 DIAGNOSIS — Z7401 Bed confinement status: Secondary | ICD-10-CM | POA: Diagnosis not present

## 2023-05-20 DIAGNOSIS — R531 Weakness: Secondary | ICD-10-CM | POA: Diagnosis not present

## 2023-05-20 LAB — BASIC METABOLIC PANEL WITH GFR
Anion gap: 11 (ref 5–15)
BUN: 16 mg/dL (ref 8–23)
CO2: 25 mmol/L (ref 22–32)
Calcium: 8.3 mg/dL — ABNORMAL LOW (ref 8.9–10.3)
Chloride: 105 mmol/L (ref 98–111)
Creatinine, Ser: 0.9 mg/dL (ref 0.44–1.00)
GFR, Estimated: >60 mL/min (ref >60)
Glucose, Bld: 161 mg/dL — ABNORMAL HIGH (ref 70–99)
Potassium: 3.8 mmol/L (ref 3.5–5.1)
Sodium: 141 mmol/L (ref 135–145)

## 2023-05-20 LAB — MAGNESIUM: Magnesium: 1.9 mg/dL (ref 1.7–2.4)

## 2023-05-20 LAB — CBC
HCT: 32.3 % — ABNORMAL LOW (ref 36.0–46.0)
Hemoglobin: 10.3 g/dL — ABNORMAL LOW (ref 12.0–15.0)
MCH: 25.2 pg — ABNORMAL LOW (ref 26.0–34.0)
MCHC: 31.9 g/dL (ref 30.0–36.0)
MCV: 79 fL — ABNORMAL LOW (ref 80.0–100.0)
Platelets: 186 10*3/uL (ref 150–400)
RBC: 4.09 MIL/uL (ref 3.87–5.11)
RDW: 16.2 % — ABNORMAL HIGH (ref 11.5–15.5)
WBC: 6.7 10*3/uL (ref 4.0–10.5)
nRBC: 0 % (ref 0.0–0.2)

## 2023-05-20 LAB — TROPONIN I (HIGH SENSITIVITY)
Troponin I (High Sensitivity): 815 ng/L (ref <18)
Troponin I (High Sensitivity): 762 ng/L (ref <18)

## 2023-05-20 LAB — GLUCOSE, CAPILLARY
Glucose-Capillary: 139 mg/dL — ABNORMAL HIGH (ref 70–99)
Glucose-Capillary: 133 mg/dL — ABNORMAL HIGH (ref 70–99)

## 2023-05-20 MED ORDER — FUROSEMIDE 20 MG PO TABS: ORAL_TABLET | ORAL | 2 refills | Status: DC

## 2023-05-20 MED ORDER — INSULIN GLARGINE 100 UNIT/ML SOLOSTAR PEN: 18.0000 [IU] | PEN_INJECTOR | Freq: Every morning | SUBCUTANEOUS | 0 refills | Status: AC

## 2023-05-20 MED ORDER — ONDANSETRON HCL 4 MG PO TABS: 4.0000 mg | ORAL_TABLET | Freq: Three times a day (TID) | ORAL | 0 refills | Status: AC | PRN

## 2023-05-20 MED ORDER — METOPROLOL TARTRATE 25 MG PO TABS: 25.0000 mg | ORAL_TABLET | Freq: Two times a day (BID) | ORAL | Status: DC

## 2023-05-20 MED ORDER — POLYETHYLENE GLYCOL 3350 17 G PO PACK: 17.0000 g | PACK | Freq: Every day | ORAL | Status: AC | PRN

## 2023-05-20 MED ORDER — SPIRONOLACTONE 25 MG PO TABS: 12.5000 mg | ORAL_TABLET | Freq: Every day | ORAL | 0 refills | Status: DC

## 2023-05-20 NOTE — Discharge Summary (Signed)
 Physician Discharge Summary   Patient: [name] MRN: [id number] DOB: September 23, 1932  Admit date:     05/19/2023  Discharge date: 05/20/23  Discharge Physician: Ephriam Hashimoto   [facility]: [facility]     Recommendations at discharge:  Follow up with PCP within 1 week Check BMP and CBC in 1 week (discharge Cr 0.9, discharge Hgb 10.3) Obtain an echocardiogram within 2 weeks     Discharge Diagnoses: Principal Problem:   Atrial fibrillation with RVR (HCC) Active Problems:   Demand ischemia ruled out, myocardial injury due to atrial fibrillation   Vomiting   Hypomagnesemia   Essential hypertension   DM (diabetes mellitus) (HCC)   (HFpEF) heart failure with preserved ejection fraction (HCC)   Ascending aortic aneurysm [facility])      Hospital Course: 89 y.o. F with pAF on Eliquis , AAA, chronic HFpEF, DM, HTN, HLD, and hx of cerebral aneurysm repair status post coiling who presented with chest pain and shortness of breath and palpitations as well as vomiting and diarrhea.  In the ER, found to be in rapid A-fib.  Started on diltiazem  drip and admitted.  CTA chest showed coronary calcifications, unchanged AAA, and no other new findings.      * Atrial fibrillation with RVR (HCC) TSH normal, magnesium  low.  Magnesium  was supplemented and she was started on diltiazem  drip.  Overnight she converted to sinus rhythm, and she was transitioned to oral metoprolol  with dose increased.    In the morning she was able to ambulate without symptoms.  Discharged on new higher dose of metoprolol , Eliquis  continued.  Myocardial injury Due to A-fib.  No chest pain or ischemic changes on ECG to suggest ischemia.  No further ischemia workup recommended in the hospital.  Recommend outpatient echocardiogram in 2 to 4 weeks.  Vomiting, possible gastroenteritis Overnight her vomiting resolved with conversion to sinus rhythm.  She had no diarrhea.  Able to tolerate oral diet.   Discharged with as needed ondansetron    Chronic (HFpEF) heart failure with preserved ejection fraction (HCC) Appears euvolemic here.  Discharged on home furosemide  and spironolactone .  I suspect she is at risk for dehydration, and recommend discharge on 3 times weekly furosemide  and 3 times weekly spironolactone  as before.               The [place]  Controlled Substances Registry was reviewed for this patient prior to discharge.  Consultants: None   Disposition: Assisted living Diet recommendation:  Discharge Diet Orders (From admission, onward)     Start     Ordered   05/20/23 0000  Diet - low sodium heart healthy        05/20/23 1039             DISCHARGE MEDICATION: Allergies as of 05/20/2023       Reactions   Actos [pioglitazone] Other (See Comments)   Chronic pedal edema   Amoxil  [amoxicillin ] Other (See Comments)   Stomach upset   Avandia [rosiglitazone] Other (See Comments)   Chronic pedal edema   Fosamax [alendronate Sodium] Other (See Comments)   Unknown reaction   Ms Contin  [morphine ] Other (See Comments)   Body spasms    Ultram  [tramadol ] Shortness Of Breath, Palpitations   Amaryl [glimepiride] Other (See Comments)   Unknown reaction   Januvia [sitagliptin] Other (See Comments)   Unknown reaction   Lipitor [atorvastatin] Swelling   Myalgias    Metformin And Related Other (See Comments)   Gastritis    Prandin [  repaglinide] Other (See Comments)   Abdominal pain   Tradjenta [linagliptin] Other (See Comments)   GI issues   Zoloft [sertraline Hcl] Other (See Comments)   Altered mental state   Eller Gut ] Other (See Comments)   Bleeding ulcers    Bactrim [sulfamethoxazole-trimethoprim] Hives   Bentyl [dicyclomine Hcl] Other (See Comments)   Near syncope Weakness    Cipro  [ciprofloxacin  Hcl] Nausea And Vomiting, Other (See Comments)   Stomach upset   Hctz [hydrochlorothiazide ] Other (See Comments)   Urinary issues   Keflex  [cephalexin] Other (See Comments)   Unknown reaction   Morphine  And Codeine Other (See Comments)   Body spasms   Norvasc  [amlodipine  Besylate] Other (See Comments)   Weakness Dizziness    Oxycontin [oxycodone] Other (See Comments)   Unknown reaction   Sulfa Antibiotics Hives   Penicillins Swelling, Rash        Medication List     TAKE these medications    Accu-Chek Guide Test test strip Generic drug: glucose blood 1 each by Other route daily.   acetaminophen  500 MG tablet Commonly known as: TYLENOL  Take 500 mg by mouth every 6 (six) hours as needed for mild pain, moderate pain, fever or headache.   apixaban  5 MG Tabs tablet Commonly known as: Eliquis  Take 1 tablet (5 mg total) by mouth 2 (two) times daily.   docusate sodium  100 MG capsule Commonly known as: COLACE Take 100 mg by mouth daily as needed for moderate constipation.   furosemide  20 MG tablet Commonly known as: LASIX  Take one tab every Mon, Wed, Fri. Take one additional tab if weight gain of 3+ lbs overnight What changed: See the new instructions.   hydrALAZINE  10 MG tablet Commonly known as: APRESOLINE  TAKE 1 TABLET BY MOUTH THREE TIMES A DAY   insulin  glargine 100 UNIT/ML Solostar Pen Commonly known as: LANTUS  Inject 18 Units into the skin in the morning.   metoprolol  tartrate 25 MG tablet Commonly known as: LOPRESSOR  Take 1 tablet (25 mg total) by mouth 2 (two) times daily. What changed: how much to take   multivitamin tablet Take 1 tablet by mouth in the morning.   ondansetron  4 MG tablet Commonly known as: ZOFRAN  Take 1 tablet (4 mg total) by mouth every 8 (eight) hours as needed for nausea or vomiting.   pantoprazole  40 MG tablet Commonly known as: PROTONIX  TAKE 1 TABLET BY MOUTH EVERY DAY BEFORE BREAKFAST   polyethylene glycol 17 g packet Commonly known as: MIRALAX  / GLYCOLAX  Take 17 g by mouth daily as needed for moderate constipation.   spironolactone  25 MG tablet Commonly known  as: ALDACTONE  Take 0.5 tablets (12.5 mg total) by mouth daily. Tues, thurs, and sat        Follow-up Information     [facility] Schedule an appointment as soon as possible for a visit in 1 week(s).   Contact information: [address] [place] [address] [phone]                 Discharge Instructions     Amb referral to AFIB Clinic   Complete by: As directed    Diet - low sodium heart healthy   Complete by: As directed    Discharge instructions   Complete by: As directed    **IMPORTANT DISCHARGE INSTRUCTIONS**   From Dr. Darlyn Eke: You were admitted for vomiting and fast heart rate  The fast heart rate was from Afib, and we treated it here with medicines  to slow the heart rate  INCREASE your metoprolol  from 12.5 mg twice daily to 25 mg twice daily  REDUCE your furosemide  to three times weekly as before  CONTINUE your other medicines (spironolactone  three times weekly, hydralazine  three times daily, and Eliquis  twice daily)  For nausea, you were treated here with ondansetron /Zofran  4 mg  If you have nausea this week, you can continue to use that as needed.  Eat a bland diet and make sure to eat frequent small meals.   Increase activity slowly   Complete by: As directed        Discharge Exam: Filed Weights   05/19/23 1113 05/19/23 2252  Weight: 75.3 kg 75.1 kg    General: Pt is alert, awake, not in acute distress Cardiovascular: RRR, nl S1-S2, no murmurs appreciated.   No LE edema.   Respiratory: Normal respiratory rate and rhythm.  CTAB without rales or wheezes. Abdominal: Abdomen soft and non-tender.  No distension or HSM.   Neuro/Psych: Strength symmetric in upper and lower extremities.  Judgment and insight appear normal.   Condition at discharge: good  The results of significant diagnostics from this hospitalization (including imaging, microbiology, ancillary and laboratory) are listed below for reference.    Imaging Studies: CT Angio Chest/Abd/Pel for Dissection W and/or Wo Contrast Result Date: 05/19/2023 CLINICAL DATA:  Chest pain, emesis, aortic aneurysm suspected EXAM: CT ANGIOGRAPHY CHEST, ABDOMEN AND PELVIS TECHNIQUE: Non-contrast CT of the chest was initially obtained. Multidetector CT imaging through the chest, abdomen and pelvis was performed using the standard protocol during bolus administration of intravenous contrast. Multiplanar reconstructed images and MIPs were obtained and reviewed to evaluate the vascular anatomy. RADIATION DOSE REDUCTION: This exam was performed according to the departmental dose-optimization program which includes automated exposure control, adjustment of the mA and/or kV according to patient size and/or use of iterative reconstruction technique. CONTRAST:  OMNIPAQUE  IOHEXOL  350 MG/ML SOLN COMPARISON:  CT chest, 02/14/2023 FINDINGS: CTA CHEST FINDINGS VASCULAR Aorta: Satisfactory opacification of the aorta. Unchanged enlargement of the tubular ascending thoracic aorta, measuring up to 4.3 x 4.2 cm in caliber. Normal caliber of the distal arch and descending thoracic aorta. Moderate mixed calcific atherosclerosis. No evidence of dissection or other acute aortic pathology. Cardiovascular: No evidence of pulmonary embolism on limited non-tailored examination. Cardiomegaly. Left coronary artery calcifications. Enlargement of the main pulmonary artery measuring up to 3.5 cm in caliber. No pericardial effusion. Review of the MIP images confirms the above findings. NON VASCULAR Mediastinum/Nodes: No enlarged mediastinal, hilar, or axillary lymph nodes. Thyroid  gland, trachea, and esophagus demonstrate no significant findings. Lungs/Pleura: Mild bibasilar scarring or atelectasis. No pleural effusion or pneumothorax. Musculoskeletal: No chest wall abnormality. No acute osseous findings. Review of the MIP images confirms the above findings. CTA ABDOMEN AND PELVIS FINDINGS VASCULAR  Normal contour and caliber of the abdominal aorta. No evidence of aneurysm, dissection, or other acute aortic pathology. Standard branching pattern of the abdominal aorta with solitary bilateral renal arteries. Mild mixed calcific atherosclerosis. Review of the MIP images confirms the above findings. NON-VASCULAR Hepatobiliary: No solid liver abnormality is seen. Hepatic steatosis. No gallstones, gallbladder wall thickening, or biliary dilatation. Pancreas: Unremarkable. No pancreatic ductal dilatation or surrounding inflammatory changes. Spleen: Normal in size without significant abnormality. Adrenals/Urinary Tract: Adrenal glands are unremarkable. Kidneys are normal, without renal calculi, solid lesion, or hydronephrosis. Bladder is unremarkable. Stomach/Bowel: Stomach is within normal limits. Appendix not clearly visualized and may be surgically absent. No evidence of bowel wall thickening, distention, or  inflammatory changes. Descending and sigmoid diverticulosis. Lymphatic: No enlarged abdominal or pelvic lymph nodes. Reproductive: Status post hysterectomy. Other: No abdominal wall hernia or abnormality. No ascites. Musculoskeletal: No acute osseous findings. IMPRESSION: 1. No evidence of dissection or other acute aortic pathology. 2. Unchanged enlargement of the tubular ascending thoracic aorta, measuring up to 4.3 x 4.2 cm in caliber. Normal caliber of the distal arch and descending thoracic aorta, as well as the abdominal aorta. 3. Cardiomegaly and coronary artery disease. 4. Enlargement of the main pulmonary artery, as can be seen in pulmonary hypertension. 5. Hepatic steatosis. 6. Descending and sigmoid diverticulosis without evidence of acute diverticulitis. Aortic Atherosclerosis (ICD10-I70.0). Electronically Signed   By: Fredricka Jenny M.D.   On: 05/19/2023 15:41   DG Chest 1 View Result Date: 05/19/2023 CLINICAL DATA:  [id number] Chest pain [id number] EXAM: CHEST  1 VIEW COMPARISON:  February 14, 2023  FINDINGS: No focal airspace consolidation, pleural effusion, or pneumothorax. Mild cardiomegaly. Tortuous aorta with aortic atherosclerosis. No acute fracture or destructive lesions. Multilevel thoracic osteophytosis. IMPRESSION: No acute cardiopulmonary abnormality. Electronically Signed   By: Rance Burrows M.D.   On: 05/19/2023 12:03    Microbiology: Results for orders placed or performed during the hospital encounter of 02/14/23  Resp panel by RT-PCR (RSV, Flu A&B, Covid) Anterior Nasal Swab     Status: None   Collection Time: 02/14/23 10:54 AM   Specimen: Anterior Nasal Swab  Result Value Ref Range Status   SARS Coronavirus 2 by RT PCR NEGATIVE NEGATIVE Final   Influenza A by PCR NEGATIVE NEGATIVE Final   Influenza B by PCR NEGATIVE NEGATIVE Final    Comment: (NOTE) The Xpert Xpress SARS-CoV-2/FLU/RSV plus assay is intended as an aid in the diagnosis of influenza from Nasopharyngeal swab specimens and should not be used as a sole basis for treatment. Nasal washings and aspirates are unacceptable for Xpert Xpress SARS-CoV-2/FLU/RSV testing.  Fact Sheet for Patients: BloggerCourse.com  Fact Sheet for Healthcare Providers: SeriousBroker.it  This test is not yet approved or cleared by the [place]  FDA and has been authorized for detection and/or diagnosis of SARS-CoV-2 by FDA under an Emergency Use Authorization (EUA). This EUA will remain in effect (meaning this test can be used) for the duration of the COVID-19 declaration under Section 564(b)(1) of the Act, 21 U.S.C. section 360bbb-3(b)(1), unless the authorization is terminated or revoked.     Resp Syncytial Virus by PCR NEGATIVE NEGATIVE Final    Comment: (NOTE) Fact Sheet for Patients: BloggerCourse.com  Fact Sheet for Healthcare Providers: SeriousBroker.it  This test is not yet approved or cleared by the [place] FDA and has been authorized for detection and/or diagnosis of SARS-CoV-2 by FDA under an Emergency Use Authorization (EUA). This EUA will remain in effect (meaning this test can be used) for the duration of the COVID-19 declaration under Section 564(b)(1) of the Act, 21 U.S.C. section 360bbb-3(b)(1), unless the authorization is terminated or revoked.  Performed at [facility], [address] [place], [place] [address]     Labs: CBC: Recent Labs  Lab 05/19/23 1150 05/20/23 0447  WBC 11.8* 6.7  NEUTROABS 9.6*  --   HGB 12.1 10.3*  HCT 37.8 32.3*  MCV 78.8* 79.0*  PLT 222 186   Basic Metabolic Panel: Recent Labs  Lab 05/19/23 1150 05/20/23 0447  NA 143 141  K 3.5 3.8  CL 104 105  CO2 26 25  GLUCOSE 171* 161*  BUN 20 16  CREATININE 0.92  0.90  CALCIUM 9.0 8.3*  MG 1.5* 1.9   Liver Function Tests: Recent Labs  Lab 05/19/23 1150  AST 19  ALT 13  ALKPHOS 69  BILITOT 0.7  PROT 7.2  ALBUMIN 3.7   CBG: Recent Labs  Lab 05/19/23 2308 05/20/23 0743  GLUCAP 166* 139*    Discharge time spent: approximately 45 minutes spent on discharge counseling, evaluation of patient on day of discharge, and coordination of discharge planning with nursing, social work, pharmacy and case management  Signed: Ephriam Hashimoto, MD [facility] 05/20/2023

## 2023-05-20 NOTE — TOC Transition Note (Signed)
 Transition of Care [facility]) - Discharge Note   Patient Details  Name: [name] MRN: [id number] Date of Birth: 06-15-1932  Transition of [facility] CM/SW Contact:  Carmon Christen, LCSWA Phone Number: 05/20/2023, 11:25 AM   Clinical Narrative:     Patient will DC to: [facility]   Anticipated DC date: 05/20/2023  Family notified: Debbie  Transport by: Lyna Sandhoff  ?  Per MD patient ready for DC to [facility]. RN, patient, patient's family, and facility notified of DC. Discharge Summary and FL2 sent to facility. RN given number for report tele# [phone] RM# 57. DC packet on chart. DNR signed by MD attached to patients DC packet.Ambulance transport requested for patient.  CSW signing off.   Final next level of care: Assisted Living Barriers to Discharge: No Barriers Identified   Patient Goals and CMS Choice Patient states their goals for this hospitalization and ongoing recovery are:: ALF   Choice offered to / list presented to : Patient      Discharge Placement              Patient chooses bed at:  [facility]) Patient to be transferred to facility by: [facility] Name of family member notified: Debbie Patient and family notified of of transfer: 05/20/23  Discharge Plan and Services Additional resources added to the After Visit Summary for   In-house Referral: Clinical Social Work                                   Social Drivers of Health (SDOH) Interventions SDOH Screenings   Food Insecurity: No Food Insecurity (05/19/2023)  Housing: Low Risk  (05/19/2023)  Transportation Needs: No Transportation Needs (05/19/2023)  Utilities: Not At Risk (05/19/2023)  Social Connections: Moderately Isolated (05/19/2023)  Tobacco Use: Low Risk  (05/19/2023)     Readmission Risk Interventions     No data to display

## 2023-05-20 NOTE — Plan of Care (Signed)
  Problem: Tissue Perfusion: Goal: Adequacy of tissue perfusion will improve Outcome: Progressing   Problem: Education: Goal: Knowledge of disease or condition will improve Outcome: Progressing   Problem: Activity: Goal: Ability to tolerate increased activity will improve Outcome: Progressing   Problem: Health Behavior/Discharge Planning: Goal: Ability to safely manage health-related needs after discharge will improve Outcome: Progressing   Problem: Education: Goal: Knowledge of General Education information will improve Description: Including pain rating scale, medication(s)/side effects and non-pharmacologic comfort measures Outcome: Progressing

## 2023-05-20 NOTE — Progress Notes (Signed)
 New patient to floor. [place] is from [place] with husband, who is on hospice. Daughter is the best contact.  Patient does have some left sided weakness of both arm and leg. She said she has had this for a long time, post a stroke 25 years ago and also a brain aneurysm. Patient also has tremors, verified by record to be benign essential tremors.   On skin she has one healing abrasion on right shin. She said she fell outside onto the cement 6 weeks ago. Fall risk.

## 2023-05-20 NOTE — Care Management Obs Status (Signed)
 MEDICARE OBSERVATION STATUS NOTIFICATION   Patient Details  Name: [name] MRN: [id number] Date of Birth: [birth year]   Medicare Observation Status Notification Given:  Yes    Moxie Kalil G., RN 05/20/2023, 9:19 AM

## 2023-05-20 NOTE — NC FL2 (Signed)
 [place]  MEDICAID FL2 LEVEL OF CARE FORM     IDENTIFICATION  Patient Name: [name] Birthdate: 09-07-1932 Sex: female Admission Date (Current Location): 05/19/2023  [facility] and [place] Number:  Producer, television/film/video and Address:  [facility] [place], [address] [place], [place] [address]      Provider Number: [id number]  Attending Physician Name and Address:  Ephriam Hashimoto, *  Relative Name and Phone Number:  Stephenie Einstein (daughter) [phone]    Current Level of Care: Hospital Recommended Level of Care: Assisted Living Facility Prior Approval Number:    Date Approved/Denied:   PASRR Number:    Discharge Plan: Other (Comment) (ALF)    Current Diagnoses: Patient Active Problem List   Diagnosis Date Noted   Atrial fibrillation with RVR (HCC) 05/19/2023   Hypomagnesemia 05/19/2023   Gastroenteritis 05/19/2023   Coronary artery calcification seen on CT scan 09/26/2022   Mitral regurgitation 09/26/2022   Acute hypoxemic respiratory failure (HCC) 02/03/2022   Hypertensive urgency 02/03/2022   (HFpEF) heart failure with preserved ejection fraction (HCC) 02/03/2022   Nausea and vomiting 02/03/2022   Elevated troponin 01/02/2022   Hyperlipidemia 01/02/2022   Old MI (myocardial infarction) 01/02/2022   Lung nodule 07/12/2021   Weakness 06/30/2021   Ankle edema, bilateral 01/21/2021   Sprain of left rotator cuff capsule 01/21/2021   Cervical radiculopathy due to degenerative joint disease of spine 01/21/2021   Left wrist pain 08/06/2019   Bicuspid aortic valve    Constipation 11/14/2017   Rectal bleeding 11/14/2017   Carotid stenosis    Heart palpitations 03/23/2017   Ascending aortic aneurysm (HCC) 12/07/2015   SOB (shortness of breath) 12/07/2015   Orthostatic hypotension 04/02/2014   Hematochezia 04/02/2014   Chest pain    Near syncope 04/01/2014   DM (diabetes mellitus) (HCC) 04/01/2014   Diverticulosis 04/01/2014   Unruptured  cerebral aneurysm 01/08/2014   Multifactorial gait disorder 01/08/2014   Aneurysm, cerebral, nonruptured 08/07/2012   Amaurosis fugax 08/07/2012   Hereditary and idiopathic peripheral neuropathy 08/07/2012   Abnormal x-ray of lumbar spine 08/29/2010   Low back pain 08/24/2010   Anxiety state 03/26/2010   Essential hypertension 03/26/2010   Bilateral lower abdominal pain 03/26/2010   GERD 12/22/2008   Diarrhea 12/22/2008   IRON DEFICIENCY 05/22/2008   Irritable bowel syndrome 04/07/2008   History of colonic polyps 04/07/2008    Orientation RESPIRATION BLADDER Height & Weight     Self, Time, Situation, Place  Normal Continent Weight: 165 lb 9.1 oz (75.1 kg) Height:  5\' 4"  (162.6 cm)  BEHAVIORAL SYMPTOMS/MOOD NEUROLOGICAL BOWEL NUTRITION STATUS      Continent Diet (Low Sodium Heart Healthy)  AMBULATORY STATUS COMMUNICATION OF NEEDS Skin     Verbally Other (Comment) (Abrasion,Leg,R,Lower,Wound/Incision LDAs)                       Personal Care Assistance Level of Assistance              Functional Limitations Info  Sight, Hearing, Speech Sight Info: Impaired (Reading glasses) Hearing Info: Impaired Speech Info: Adequate    SPECIAL CARE FACTORS FREQUENCY  PT (By licensed PT), OT (By licensed OT)     PT Frequency: 3x min weekly OT Frequency: 3x min weekly            Contractures Contractures Info: Not present    Additional Factors Info  Code Status, Allergies, Insulin  Sliding Scale Code Status Info: DNR Allergies Info:  Actos (pioglitazone),Amoxil  (amoxicillin ),Avandia (rosiglitazone),Fosamax (alendronate Sodium),Ms Contin  (morphine ),Ultram  (tramadol ),Amaryl (glimepiride),Januvia (sitagliptin),Lipitor (atorvastatin),Metformin And Related,Prandin (repaglinide),Tradjenta (linagliptin),Zoloft (sertraline Hcl),Asa (aspirin ),Bactrim (sulfamethoxazole-trimethoprim),Bentyl (dicyclomine Hcl),Cipro  (ciprofloxacin  Hcl),Hctz (hydrochlorothiazide ),Keflex  (cephalexin),Morphine  And Codeine,Norvasc  (amlodipine  Besylate),Oxycontin (oxycodone),Sulfa Antibiotics,Penicillins   Insulin  Sliding Scale Info: insulin  aspart (novoLOG ) injection 0-9 Units 3 times daily with meals,insulin  glargine-yfgn (SEMGLEE ) injection 10 Units daily       TAKE these medications     Accu-Chek Guide Test test strip Generic drug: glucose blood 1 each by Other route daily.    acetaminophen  500 MG tablet Commonly known as: TYLENOL  Take 500 mg by mouth every 6 (six) hours as needed for mild pain, moderate pain, fever or headache.    apixaban  5 MG Tabs tablet Commonly known as: Eliquis  Take 1 tablet (5 mg total) by mouth 2 (two) times daily.    docusate sodium  100 MG capsule Commonly known as: COLACE Take 100 mg by mouth daily as needed for moderate constipation.    furosemide  20 MG tablet Commonly known as: LASIX  Take one tab every Mon, Wed, Fri. Take one additional tab if weight gain of 3+ lbs overnight What changed: See the new instructions.    hydrALAZINE  10 MG tablet Commonly known as: APRESOLINE  TAKE 1 TABLET BY MOUTH THREE TIMES A DAY    insulin  glargine 100 UNIT/ML Solostar Pen Commonly known as: LANTUS  Inject 18 Units into the skin in the morning.    metoprolol  tartrate 25 MG tablet Commonly known as: LOPRESSOR  Take 1 tablet (25 mg total) by mouth 2 (two) times daily. What changed: how much to take    multivitamin tablet Take 1 tablet by mouth in the morning.    ondansetron  4 MG tablet Commonly known as: ZOFRAN  Take 1 tablet (4 mg total) by mouth every 8 (eight) hours as needed for nausea or vomiting.    pantoprazole  40 MG tablet Commonly known as: PROTONIX  TAKE 1 TABLET BY MOUTH EVERY DAY BEFORE BREAKFAST    polyethylene glycol 17 g packet Commonly known as: MIRALAX  / GLYCOLAX  Take 17 g by mouth daily as needed for moderate constipation.    spironolactone  25 MG tablet Commonly known as: ALDACTONE  Take 0.5 tablets (12.5 mg total) by  mouth daily. Tues, thurs, and sat     Relevant Imaging Results:  Relevant Lab Results:   Additional Information SSN-[id number]  Carmon Christen, LCSWA

## 2023-05-20 NOTE — TOC Initial Note (Signed)
 Transition of Care [facility]) - Initial/Assessment Note    Patient Details  Name: [name] MRN: [id number] Date of Birth: July 06, 1932  [facility] CM/SW Contact:    Carmon Christen, LCSWA Phone Number: 05/20/2023, 10:47 AM  Clinical Narrative:                   CSW spoke with patient who informed CSW she comes from [facility]. Patient confirmed plan to return when medically ready for dc. All questions answered. No further questions reported at this time. CSW spoke with patients daughter who confirmed patient will transport to ALF by [facility]. CSW awaiting call back from ALF to coordinate patients return.  Expected Discharge Plan: Assisted Living Barriers to Discharge: No Barriers Identified   Patient Goals and CMS Choice Patient states their goals for this hospitalization and ongoing recovery are:: ALF   Choice offered to / list presented to : Patient      Expected Discharge Plan and Services In-house Referral: Clinical Social Work     Living arrangements for the past 2 months: Assisted Living Facility Expected Discharge Date: 05/20/23                                    Prior Living Arrangements/Services Living arrangements for the past 2 months: Assisted Living Facility Lives with:: Facility Resident Patient language and need for interpreter reviewed:: Yes Do you feel safe going back to the place where you live?: Yes      Need for Family Participation in Patient Care: Yes (Comment) Care giver support system in place?: Yes (comment)   Criminal Activity/Legal Involvement Pertinent to Current Situation/Hospitalization: No - Comment as needed  Activities of Daily Living   ADL Screening (condition at time of admission) Independently performs ADLs?: Yes (appropriate for developmental age) Is the patient deaf or have difficulty hearing?: Yes Does the patient have difficulty seeing, even when wearing glasses/contacts?: No Does the patient  have difficulty concentrating, remembering, or making decisions?: Yes  Permission Sought/Granted Permission sought to share information with : Case Manager, Family Supports, Oceanographer granted to share information with : Yes, Verbal Permission Granted     Permission granted to share info w AGENCY: ALF        Emotional Assessment   Attitude/Demeanor/Rapport: Gracious Affect (typically observed): Calm Orientation: : Oriented to Self, Oriented to Place, Oriented to  Time, Oriented to Situation Alcohol / Substance Use: Not Applicable Psych Involvement: No (comment)  Admission diagnosis:  Atrial fibrillation with RVR (HCC) [I48.91] Patient Active Problem List   Diagnosis Date Noted   Atrial fibrillation with RVR (HCC) 05/19/2023   Hypomagnesemia 05/19/2023   Gastroenteritis 05/19/2023   Coronary artery calcification seen on CT scan 09/26/2022   Mitral regurgitation 09/26/2022   Acute hypoxemic respiratory failure (HCC) 02/03/2022   Hypertensive urgency 02/03/2022   (HFpEF) heart failure with preserved ejection fraction (HCC) 02/03/2022   Nausea and vomiting 02/03/2022   Elevated troponin 01/02/2022   Hyperlipidemia 01/02/2022   Old MI (myocardial infarction) 01/02/2022   Lung nodule 07/12/2021   Weakness 06/30/2021   Ankle edema, bilateral 01/21/2021   Sprain of left rotator cuff capsule 01/21/2021   Cervical radiculopathy due to degenerative joint disease of spine 01/21/2021   Left wrist pain 08/06/2019   Bicuspid aortic valve    Constipation 11/14/2017   Rectal bleeding 11/14/2017   Carotid stenosis  Heart palpitations 03/23/2017   Ascending aortic aneurysm (HCC) 12/07/2015   SOB (shortness of breath) 12/07/2015   Orthostatic hypotension 04/02/2014   Hematochezia 04/02/2014   Chest pain    Near syncope 04/01/2014   DM (diabetes mellitus) (HCC) 04/01/2014   Diverticulosis 04/01/2014   Unruptured cerebral aneurysm 01/08/2014    Multifactorial gait disorder 01/08/2014   Aneurysm, cerebral, nonruptured 08/07/2012   Amaurosis fugax 08/07/2012   Hereditary and idiopathic peripheral neuropathy 08/07/2012   Abnormal x-ray of lumbar spine 08/29/2010   Low back pain 08/24/2010   Anxiety state 03/26/2010   Essential hypertension 03/26/2010   Bilateral lower abdominal pain 03/26/2010   GERD 12/22/2008   Diarrhea 12/22/2008   IRON DEFICIENCY 05/22/2008   Irritable bowel syndrome 04/07/2008   History of colonic polyps 04/07/2008   PCP:  Gerda Knows, Pllc Pharmacy:   [phone] - [place], [place] - [address] AT [address] OF [facility] [address] [place] [address] Phone: [phone] Fax: [phone]  [facility] [address] [place] [address] Phone: [phone] Fax: [phone]     Social Drivers of Health (SDOH) Social History: SDOH Screenings   Food Insecurity: No Food Insecurity (05/19/2023)  Housing: Low Risk  (05/19/2023)  Transportation Needs: No Transportation Needs (05/19/2023)  Utilities: Not At Risk (05/19/2023)  Social Connections: Moderately Isolated (05/19/2023)  Tobacco Use: Low Risk  (05/19/2023)   SDOH Interventions:     Readmission Risk Interventions     No data to display

## 2023-05-22 DIAGNOSIS — A09 Infectious gastroenteritis and colitis, unspecified: Secondary | ICD-10-CM | POA: Diagnosis not present

## 2023-05-22 DIAGNOSIS — I4891 Unspecified atrial fibrillation: Secondary | ICD-10-CM | POA: Diagnosis not present

## 2023-05-22 DIAGNOSIS — I5A Non-ischemic myocardial injury (non-traumatic): Secondary | ICD-10-CM | POA: Diagnosis not present

## 2023-05-22 DIAGNOSIS — I130 Hypertensive heart and chronic kidney disease with heart failure and stage 1 through stage 4 chronic kidney disease, or unspecified chronic kidney disease: Secondary | ICD-10-CM | POA: Diagnosis not present

## 2023-05-22 DIAGNOSIS — I5032 Chronic diastolic (congestive) heart failure: Secondary | ICD-10-CM | POA: Diagnosis not present

## 2023-05-23 ENCOUNTER — Telehealth: Payer: Self-pay | Admitting: Emergency Medicine

## 2023-05-23 NOTE — Telephone Encounter (Signed)
 The patient has been called and reports her husband just passed (died 05/24/23), pt needs rescheduling with Dr Leala Prince after mourning period.  Please wait a couple of weeks to call her.  Condolences given and pt encouraged to call if any she needs anything

## 2023-05-23 NOTE — Telephone Encounter (Signed)
-----   Message from Morey Ar sent at 05/23/2023  4:22 PM EDT ----- Can we look into changing this patient's follow up to [facility]. She is coming from an assisted living facility in [place] and it seems a little silly to have her come all the way out to [place]. I don't mind seeing her but this really does not serve the patient best in my opinion.   Thank you!  DW

## 2023-05-25 ENCOUNTER — Ambulatory Visit: Admitting: Student

## 2023-05-25 DIAGNOSIS — E119 Type 2 diabetes mellitus without complications: Secondary | ICD-10-CM | POA: Diagnosis not present

## 2023-05-25 DIAGNOSIS — I4891 Unspecified atrial fibrillation: Secondary | ICD-10-CM | POA: Diagnosis not present

## 2023-05-25 DIAGNOSIS — I110 Hypertensive heart disease with heart failure: Secondary | ICD-10-CM | POA: Diagnosis not present

## 2023-05-25 DIAGNOSIS — S81801D Unspecified open wound, right lower leg, subsequent encounter: Secondary | ICD-10-CM | POA: Diagnosis not present

## 2023-05-25 DIAGNOSIS — I2510 Atherosclerotic heart disease of native coronary artery without angina pectoris: Secondary | ICD-10-CM | POA: Diagnosis not present

## 2023-05-25 DIAGNOSIS — S31819D Unspecified open wound of right buttock, subsequent encounter: Secondary | ICD-10-CM | POA: Diagnosis not present

## 2023-05-25 DIAGNOSIS — I5032 Chronic diastolic (congestive) heart failure: Secondary | ICD-10-CM | POA: Diagnosis not present

## 2023-05-25 DIAGNOSIS — I480 Paroxysmal atrial fibrillation: Secondary | ICD-10-CM | POA: Diagnosis not present

## 2023-05-25 DIAGNOSIS — I7121 Aneurysm of the ascending aorta, without rupture: Secondary | ICD-10-CM | POA: Diagnosis not present

## 2023-05-25 DIAGNOSIS — F411 Generalized anxiety disorder: Secondary | ICD-10-CM | POA: Diagnosis not present

## 2023-05-30 DIAGNOSIS — I110 Hypertensive heart disease with heart failure: Secondary | ICD-10-CM | POA: Diagnosis not present

## 2023-05-30 DIAGNOSIS — I4891 Unspecified atrial fibrillation: Secondary | ICD-10-CM | POA: Diagnosis not present

## 2023-05-30 DIAGNOSIS — S81801D Unspecified open wound, right lower leg, subsequent encounter: Secondary | ICD-10-CM | POA: Diagnosis not present

## 2023-05-30 DIAGNOSIS — F411 Generalized anxiety disorder: Secondary | ICD-10-CM | POA: Diagnosis not present

## 2023-05-30 DIAGNOSIS — N1831 Chronic kidney disease, stage 3a: Secondary | ICD-10-CM | POA: Diagnosis not present

## 2023-05-30 DIAGNOSIS — I480 Paroxysmal atrial fibrillation: Secondary | ICD-10-CM | POA: Diagnosis not present

## 2023-05-30 DIAGNOSIS — I2510 Atherosclerotic heart disease of native coronary artery without angina pectoris: Secondary | ICD-10-CM | POA: Diagnosis not present

## 2023-05-30 DIAGNOSIS — I5032 Chronic diastolic (congestive) heart failure: Secondary | ICD-10-CM | POA: Diagnosis not present

## 2023-05-30 DIAGNOSIS — E119 Type 2 diabetes mellitus without complications: Secondary | ICD-10-CM | POA: Diagnosis not present

## 2023-05-30 DIAGNOSIS — S31819D Unspecified open wound of right buttock, subsequent encounter: Secondary | ICD-10-CM | POA: Diagnosis not present

## 2023-05-30 DIAGNOSIS — I130 Hypertensive heart and chronic kidney disease with heart failure and stage 1 through stage 4 chronic kidney disease, or unspecified chronic kidney disease: Secondary | ICD-10-CM | POA: Diagnosis not present

## 2023-05-30 DIAGNOSIS — I7121 Aneurysm of the ascending aorta, without rupture: Secondary | ICD-10-CM | POA: Diagnosis not present

## 2023-05-30 DIAGNOSIS — I10 Essential (primary) hypertension: Secondary | ICD-10-CM | POA: Diagnosis not present

## 2023-05-30 DIAGNOSIS — I509 Heart failure, unspecified: Secondary | ICD-10-CM | POA: Diagnosis not present

## 2023-06-01 DIAGNOSIS — E119 Type 2 diabetes mellitus without complications: Secondary | ICD-10-CM | POA: Diagnosis not present

## 2023-06-01 DIAGNOSIS — S81801D Unspecified open wound, right lower leg, subsequent encounter: Secondary | ICD-10-CM | POA: Diagnosis not present

## 2023-06-01 DIAGNOSIS — I110 Hypertensive heart disease with heart failure: Secondary | ICD-10-CM | POA: Diagnosis not present

## 2023-06-01 DIAGNOSIS — I5032 Chronic diastolic (congestive) heart failure: Secondary | ICD-10-CM | POA: Diagnosis not present

## 2023-06-01 DIAGNOSIS — F411 Generalized anxiety disorder: Secondary | ICD-10-CM | POA: Diagnosis not present

## 2023-06-01 DIAGNOSIS — S31819D Unspecified open wound of right buttock, subsequent encounter: Secondary | ICD-10-CM | POA: Diagnosis not present

## 2023-06-01 DIAGNOSIS — I7121 Aneurysm of the ascending aorta, without rupture: Secondary | ICD-10-CM | POA: Diagnosis not present

## 2023-06-01 DIAGNOSIS — I480 Paroxysmal atrial fibrillation: Secondary | ICD-10-CM | POA: Diagnosis not present

## 2023-06-01 DIAGNOSIS — I2510 Atherosclerotic heart disease of native coronary artery without angina pectoris: Secondary | ICD-10-CM | POA: Diagnosis not present

## 2023-06-03 ENCOUNTER — Other Ambulatory Visit: Payer: Self-pay | Admitting: Cardiology

## 2023-06-05 DIAGNOSIS — I5032 Chronic diastolic (congestive) heart failure: Secondary | ICD-10-CM | POA: Diagnosis not present

## 2023-06-05 DIAGNOSIS — I4891 Unspecified atrial fibrillation: Secondary | ICD-10-CM | POA: Diagnosis not present

## 2023-06-05 DIAGNOSIS — I130 Hypertensive heart and chronic kidney disease with heart failure and stage 1 through stage 4 chronic kidney disease, or unspecified chronic kidney disease: Secondary | ICD-10-CM | POA: Diagnosis not present

## 2023-06-05 DIAGNOSIS — M170 Bilateral primary osteoarthritis of knee: Secondary | ICD-10-CM | POA: Diagnosis not present

## 2023-06-05 DIAGNOSIS — F419 Anxiety disorder, unspecified: Secondary | ICD-10-CM | POA: Diagnosis not present

## 2023-06-05 DIAGNOSIS — N1831 Chronic kidney disease, stage 3a: Secondary | ICD-10-CM | POA: Diagnosis not present

## 2023-06-05 DIAGNOSIS — G4700 Insomnia, unspecified: Secondary | ICD-10-CM | POA: Diagnosis not present

## 2023-06-05 MED ORDER — SPIRONOLACTONE 25 MG PO TABS: 12.5000 mg | ORAL_TABLET | Freq: Every day | ORAL | 0 refills | Status: DC

## 2023-06-05 NOTE — Addendum Note (Signed)
 Addended by: Debrah Fan, Ewing Fandino L on: 06/05/2023 09:26 AM   Modules accepted: Orders

## 2023-06-06 DIAGNOSIS — I480 Paroxysmal atrial fibrillation: Secondary | ICD-10-CM | POA: Diagnosis not present

## 2023-06-06 DIAGNOSIS — S31819D Unspecified open wound of right buttock, subsequent encounter: Secondary | ICD-10-CM | POA: Diagnosis not present

## 2023-06-06 DIAGNOSIS — S81801D Unspecified open wound, right lower leg, subsequent encounter: Secondary | ICD-10-CM | POA: Diagnosis not present

## 2023-06-06 DIAGNOSIS — I7121 Aneurysm of the ascending aorta, without rupture: Secondary | ICD-10-CM | POA: Diagnosis not present

## 2023-06-06 DIAGNOSIS — E119 Type 2 diabetes mellitus without complications: Secondary | ICD-10-CM | POA: Diagnosis not present

## 2023-06-06 DIAGNOSIS — I2510 Atherosclerotic heart disease of native coronary artery without angina pectoris: Secondary | ICD-10-CM | POA: Diagnosis not present

## 2023-06-06 DIAGNOSIS — I110 Hypertensive heart disease with heart failure: Secondary | ICD-10-CM | POA: Diagnosis not present

## 2023-06-06 DIAGNOSIS — I5032 Chronic diastolic (congestive) heart failure: Secondary | ICD-10-CM | POA: Diagnosis not present

## 2023-06-06 DIAGNOSIS — F411 Generalized anxiety disorder: Secondary | ICD-10-CM | POA: Diagnosis not present

## 2023-06-07 DIAGNOSIS — I7121 Aneurysm of the ascending aorta, without rupture: Secondary | ICD-10-CM | POA: Diagnosis not present

## 2023-06-07 DIAGNOSIS — I2510 Atherosclerotic heart disease of native coronary artery without angina pectoris: Secondary | ICD-10-CM | POA: Diagnosis not present

## 2023-06-07 DIAGNOSIS — E119 Type 2 diabetes mellitus without complications: Secondary | ICD-10-CM | POA: Diagnosis not present

## 2023-06-07 DIAGNOSIS — S81801D Unspecified open wound, right lower leg, subsequent encounter: Secondary | ICD-10-CM | POA: Diagnosis not present

## 2023-06-07 DIAGNOSIS — I480 Paroxysmal atrial fibrillation: Secondary | ICD-10-CM | POA: Diagnosis not present

## 2023-06-07 DIAGNOSIS — I110 Hypertensive heart disease with heart failure: Secondary | ICD-10-CM | POA: Diagnosis not present

## 2023-06-07 DIAGNOSIS — F411 Generalized anxiety disorder: Secondary | ICD-10-CM | POA: Diagnosis not present

## 2023-06-07 DIAGNOSIS — I5032 Chronic diastolic (congestive) heart failure: Secondary | ICD-10-CM | POA: Diagnosis not present

## 2023-06-07 DIAGNOSIS — S31819D Unspecified open wound of right buttock, subsequent encounter: Secondary | ICD-10-CM | POA: Diagnosis not present

## 2023-06-12 DIAGNOSIS — F4321 Adjustment disorder with depressed mood: Secondary | ICD-10-CM | POA: Diagnosis not present

## 2023-06-13 DIAGNOSIS — I110 Hypertensive heart disease with heart failure: Secondary | ICD-10-CM | POA: Diagnosis not present

## 2023-06-13 DIAGNOSIS — I5032 Chronic diastolic (congestive) heart failure: Secondary | ICD-10-CM | POA: Diagnosis not present

## 2023-06-13 DIAGNOSIS — S81801D Unspecified open wound, right lower leg, subsequent encounter: Secondary | ICD-10-CM | POA: Diagnosis not present

## 2023-06-13 DIAGNOSIS — E119 Type 2 diabetes mellitus without complications: Secondary | ICD-10-CM | POA: Diagnosis not present

## 2023-06-13 DIAGNOSIS — S31819D Unspecified open wound of right buttock, subsequent encounter: Secondary | ICD-10-CM | POA: Diagnosis not present

## 2023-06-13 DIAGNOSIS — I2510 Atherosclerotic heart disease of native coronary artery without angina pectoris: Secondary | ICD-10-CM | POA: Diagnosis not present

## 2023-06-13 DIAGNOSIS — I480 Paroxysmal atrial fibrillation: Secondary | ICD-10-CM | POA: Diagnosis not present

## 2023-06-13 DIAGNOSIS — F411 Generalized anxiety disorder: Secondary | ICD-10-CM | POA: Diagnosis not present

## 2023-06-13 DIAGNOSIS — I7121 Aneurysm of the ascending aorta, without rupture: Secondary | ICD-10-CM | POA: Diagnosis not present

## 2023-06-16 DIAGNOSIS — E119 Type 2 diabetes mellitus without complications: Secondary | ICD-10-CM | POA: Diagnosis not present

## 2023-06-16 DIAGNOSIS — S81801D Unspecified open wound, right lower leg, subsequent encounter: Secondary | ICD-10-CM | POA: Diagnosis not present

## 2023-06-16 DIAGNOSIS — S31819D Unspecified open wound of right buttock, subsequent encounter: Secondary | ICD-10-CM | POA: Diagnosis not present

## 2023-06-16 DIAGNOSIS — I5032 Chronic diastolic (congestive) heart failure: Secondary | ICD-10-CM | POA: Diagnosis not present

## 2023-06-16 DIAGNOSIS — I7121 Aneurysm of the ascending aorta, without rupture: Secondary | ICD-10-CM | POA: Diagnosis not present

## 2023-06-16 DIAGNOSIS — I110 Hypertensive heart disease with heart failure: Secondary | ICD-10-CM | POA: Diagnosis not present

## 2023-06-16 DIAGNOSIS — I480 Paroxysmal atrial fibrillation: Secondary | ICD-10-CM | POA: Diagnosis not present

## 2023-06-16 DIAGNOSIS — F411 Generalized anxiety disorder: Secondary | ICD-10-CM | POA: Diagnosis not present

## 2023-06-16 DIAGNOSIS — I2510 Atherosclerotic heart disease of native coronary artery without angina pectoris: Secondary | ICD-10-CM | POA: Diagnosis not present

## 2023-06-18 ENCOUNTER — Inpatient Hospital Stay (HOSPITAL_COMMUNITY)

## 2023-06-18 ENCOUNTER — Emergency Department (HOSPITAL_COMMUNITY)

## 2023-06-18 ENCOUNTER — Other Ambulatory Visit: Payer: Self-pay

## 2023-06-18 ENCOUNTER — Encounter (HOSPITAL_COMMUNITY): Payer: Self-pay

## 2023-06-18 ENCOUNTER — Inpatient Hospital Stay (HOSPITAL_COMMUNITY)
Admission: EM | Admit: 2023-06-18 | Discharge: 2023-06-23 | DRG: 291 | Disposition: A | Discharge status: Nursing Home (Includes ICF) | Source: Skilled Nursing Facility | Attending: Family Medicine | Admitting: Family Medicine

## 2023-06-18 ENCOUNTER — Other Ambulatory Visit (HOSPITAL_COMMUNITY)

## 2023-06-18 DIAGNOSIS — I716 Thoracoabdominal aortic aneurysm, without rupture, unspecified: Secondary | ICD-10-CM | POA: Diagnosis present

## 2023-06-18 DIAGNOSIS — E1122 Type 2 diabetes mellitus with diabetic chronic kidney disease: Secondary | ICD-10-CM | POA: Diagnosis present

## 2023-06-18 DIAGNOSIS — Z794 Long term (current) use of insulin: Secondary | ICD-10-CM | POA: Diagnosis not present

## 2023-06-18 DIAGNOSIS — S0003XA Contusion of scalp, initial encounter: Secondary | ICD-10-CM

## 2023-06-18 DIAGNOSIS — F419 Anxiety disorder, unspecified: Secondary | ICD-10-CM | POA: Diagnosis present

## 2023-06-18 DIAGNOSIS — D631 Anemia in chronic kidney disease: Secondary | ICD-10-CM | POA: Diagnosis not present

## 2023-06-18 DIAGNOSIS — R9389 Abnormal findings on diagnostic imaging of other specified body structures: Secondary | ICD-10-CM | POA: Diagnosis not present

## 2023-06-18 DIAGNOSIS — R000 Tachycardia, unspecified: Secondary | ICD-10-CM | POA: Diagnosis not present

## 2023-06-18 DIAGNOSIS — Z634 Disappearance and death of family member: Secondary | ICD-10-CM

## 2023-06-18 DIAGNOSIS — Z882 Allergy status to sulfonamides status: Secondary | ICD-10-CM

## 2023-06-18 DIAGNOSIS — N1831 Chronic kidney disease, stage 3a: Secondary | ICD-10-CM | POA: Diagnosis not present

## 2023-06-18 DIAGNOSIS — I700 Atherosclerosis of aorta: Secondary | ICD-10-CM | POA: Diagnosis not present

## 2023-06-18 DIAGNOSIS — I7121 Aneurysm of the ascending aorta, without rupture: Secondary | ICD-10-CM | POA: Diagnosis not present

## 2023-06-18 DIAGNOSIS — E876 Hypokalemia: Secondary | ICD-10-CM | POA: Diagnosis present

## 2023-06-18 DIAGNOSIS — Z885 Allergy status to narcotic agent status: Secondary | ICD-10-CM

## 2023-06-18 DIAGNOSIS — W19XXXA Unspecified fall, initial encounter: Secondary | ICD-10-CM | POA: Diagnosis not present

## 2023-06-18 DIAGNOSIS — Z86008 Personal history of in-situ neoplasm of other site: Secondary | ICD-10-CM

## 2023-06-18 DIAGNOSIS — R55 Syncope and collapse: Secondary | ICD-10-CM | POA: Diagnosis present

## 2023-06-18 DIAGNOSIS — I4891 Unspecified atrial fibrillation: Secondary | ICD-10-CM | POA: Diagnosis present

## 2023-06-18 DIAGNOSIS — Z880 Allergy status to penicillin: Secondary | ICD-10-CM

## 2023-06-18 DIAGNOSIS — R8281 Pyuria: Secondary | ICD-10-CM | POA: Diagnosis present

## 2023-06-18 DIAGNOSIS — Z800 Family history of malignant neoplasm of digestive organs: Secondary | ICD-10-CM

## 2023-06-18 DIAGNOSIS — I340 Nonrheumatic mitral (valve) insufficiency: Secondary | ICD-10-CM | POA: Diagnosis not present

## 2023-06-18 DIAGNOSIS — K219 Gastro-esophageal reflux disease without esophagitis: Secondary | ICD-10-CM | POA: Diagnosis present

## 2023-06-18 DIAGNOSIS — R4182 Altered mental status, unspecified: Secondary | ICD-10-CM

## 2023-06-18 DIAGNOSIS — Z7901 Long term (current) use of anticoagulants: Secondary | ICD-10-CM

## 2023-06-18 DIAGNOSIS — E7800 Pure hypercholesterolemia, unspecified: Secondary | ICD-10-CM | POA: Diagnosis present

## 2023-06-18 DIAGNOSIS — S0101XA Laceration without foreign body of scalp, initial encounter: Secondary | ICD-10-CM | POA: Diagnosis present

## 2023-06-18 DIAGNOSIS — I252 Old myocardial infarction: Secondary | ICD-10-CM | POA: Diagnosis not present

## 2023-06-18 DIAGNOSIS — G319 Degenerative disease of nervous system, unspecified: Secondary | ICD-10-CM | POA: Diagnosis not present

## 2023-06-18 DIAGNOSIS — Z841 Family history of disorders of kidney and ureter: Secondary | ICD-10-CM

## 2023-06-18 DIAGNOSIS — R079 Chest pain, unspecified: Secondary | ICD-10-CM | POA: Diagnosis not present

## 2023-06-18 DIAGNOSIS — W1830XA Fall on same level, unspecified, initial encounter: Secondary | ICD-10-CM | POA: Diagnosis present

## 2023-06-18 DIAGNOSIS — I5033 Acute on chronic diastolic (congestive) heart failure: Secondary | ICD-10-CM | POA: Diagnosis present

## 2023-06-18 DIAGNOSIS — I130 Hypertensive heart and chronic kidney disease with heart failure and stage 1 through stage 4 chronic kidney disease, or unspecified chronic kidney disease: Secondary | ICD-10-CM | POA: Diagnosis not present

## 2023-06-18 DIAGNOSIS — E1142 Type 2 diabetes mellitus with diabetic polyneuropathy: Secondary | ICD-10-CM | POA: Diagnosis present

## 2023-06-18 DIAGNOSIS — Z860101 Personal history of adenomatous and serrated colon polyps: Secondary | ICD-10-CM

## 2023-06-18 DIAGNOSIS — R0902 Hypoxemia: Secondary | ICD-10-CM | POA: Diagnosis present

## 2023-06-18 DIAGNOSIS — Z8249 Family history of ischemic heart disease and other diseases of the circulatory system: Secondary | ICD-10-CM

## 2023-06-18 DIAGNOSIS — I10 Essential (primary) hypertension: Secondary | ICD-10-CM

## 2023-06-18 DIAGNOSIS — Z8041 Family history of malignant neoplasm of ovary: Secondary | ICD-10-CM

## 2023-06-18 DIAGNOSIS — S199XXA Unspecified injury of neck, initial encounter: Secondary | ICD-10-CM | POA: Diagnosis not present

## 2023-06-18 DIAGNOSIS — I480 Paroxysmal atrial fibrillation: Secondary | ICD-10-CM | POA: Diagnosis present

## 2023-06-18 DIAGNOSIS — J439 Emphysema, unspecified: Secondary | ICD-10-CM | POA: Diagnosis not present

## 2023-06-18 DIAGNOSIS — Z833 Family history of diabetes mellitus: Secondary | ICD-10-CM

## 2023-06-18 DIAGNOSIS — R58 Hemorrhage, not elsewhere classified: Secondary | ICD-10-CM | POA: Diagnosis not present

## 2023-06-18 DIAGNOSIS — I2510 Atherosclerotic heart disease of native coronary artery without angina pectoris: Secondary | ICD-10-CM | POA: Diagnosis present

## 2023-06-18 DIAGNOSIS — Z79899 Other long term (current) drug therapy: Secondary | ICD-10-CM

## 2023-06-18 DIAGNOSIS — Z881 Allergy status to other antibiotic agents status: Secondary | ICD-10-CM

## 2023-06-18 DIAGNOSIS — Z85038 Personal history of other malignant neoplasm of large intestine: Secondary | ICD-10-CM

## 2023-06-18 DIAGNOSIS — S06310A Contusion and laceration of right cerebrum without loss of consciousness, initial encounter: Secondary | ICD-10-CM | POA: Diagnosis not present

## 2023-06-18 DIAGNOSIS — I517 Cardiomegaly: Secondary | ICD-10-CM | POA: Diagnosis not present

## 2023-06-18 DIAGNOSIS — Z886 Allergy status to analgesic agent status: Secondary | ICD-10-CM

## 2023-06-18 DIAGNOSIS — Z955 Presence of coronary angioplasty implant and graft: Secondary | ICD-10-CM

## 2023-06-18 DIAGNOSIS — Z888 Allergy status to other drugs, medicaments and biological substances status: Secondary | ICD-10-CM

## 2023-06-18 DIAGNOSIS — Z7401 Bed confinement status: Secondary | ICD-10-CM | POA: Diagnosis not present

## 2023-06-18 LAB — CBC WITH DIFFERENTIAL/PLATELET
Abs Immature Granulocytes: 0.09 10*3/uL — ABNORMAL HIGH (ref 0.00–0.07)
Basophils Absolute: 0.1 10*3/uL (ref 0.0–0.1)
Basophils Relative: 1 %
Eosinophils Absolute: 0.2 10*3/uL (ref 0.0–0.5)
Eosinophils Relative: 2 %
HCT: 36.7 % (ref 36.0–46.0)
Hemoglobin: 11.3 g/dL — ABNORMAL LOW (ref 12.0–15.0)
Immature Granulocytes: 1 %
Lymphocytes Relative: 44 %
Lymphs Abs: 5.3 10*3/uL — ABNORMAL HIGH (ref 0.7–4.0)
MCH: 24.4 pg — ABNORMAL LOW (ref 26.0–34.0)
MCHC: 30.8 g/dL (ref 30.0–36.0)
MCV: 79.3 fL — ABNORMAL LOW (ref 80.0–100.0)
Monocytes Absolute: 0.8 10*3/uL (ref 0.1–1.0)
Monocytes Relative: 7 %
Neutro Abs: 5.6 10*3/uL (ref 1.7–7.7)
Neutrophils Relative %: 45 %
Platelets: 202 10*3/uL (ref 150–400)
RBC: 4.63 MIL/uL (ref 3.87–5.11)
RDW: 16.3 % — ABNORMAL HIGH (ref 11.5–15.5)
WBC: 12 10*3/uL — ABNORMAL HIGH (ref 4.0–10.5)
nRBC: 0 % (ref 0.0–0.2)

## 2023-06-18 LAB — GLUCOSE, CAPILLARY
Glucose-Capillary: 132 mg/dL — ABNORMAL HIGH (ref 70–99)
Glucose-Capillary: 122 mg/dL — ABNORMAL HIGH (ref 70–99)
Glucose-Capillary: 141 mg/dL — ABNORMAL HIGH (ref 70–99)

## 2023-06-18 LAB — COMPREHENSIVE METABOLIC PANEL WITH GFR
ALT: 15 U/L (ref 0–44)
AST: 23 U/L (ref 15–41)
Albumin: 3.6 g/dL (ref 3.5–5.0)
Alkaline Phosphatase: 61 U/L (ref 38–126)
Anion gap: 14 (ref 5–15)
BUN: 15 mg/dL (ref 8–23)
CO2: 25 mmol/L (ref 22–32)
Calcium: 9 mg/dL (ref 8.9–10.3)
Chloride: 103 mmol/L (ref 98–111)
Creatinine, Ser: 0.95 mg/dL (ref 0.44–1.00)
GFR, Estimated: 58 mL/min — ABNORMAL LOW (ref >60)
Glucose, Bld: 162 mg/dL — ABNORMAL HIGH (ref 70–99)
Potassium: 3.3 mmol/L — ABNORMAL LOW (ref 3.5–5.1)
Sodium: 142 mmol/L (ref 135–145)
Total Bilirubin: 0.6 mg/dL (ref 0.0–1.2)
Total Protein: 6.6 g/dL (ref 6.5–8.1)

## 2023-06-18 LAB — URINALYSIS, ROUTINE W REFLEX MICROSCOPIC
Bacteria, UA: NONE SEEN
Bilirubin Urine: NEGATIVE
Glucose, UA: NEGATIVE mg/dL
Hgb urine dipstick: NEGATIVE
Ketones, ur: NEGATIVE mg/dL
Nitrite: NEGATIVE
Protein, ur: 100 mg/dL — AB
Specific Gravity, Urine: 1.015 (ref 1.005–1.030)
pH: 6 (ref 5.0–8.0)

## 2023-06-18 LAB — CBG MONITORING, ED
Glucose-Capillary: 155 mg/dL — ABNORMAL HIGH (ref 70–99)
Glucose-Capillary: 140 mg/dL — ABNORMAL HIGH (ref 70–99)

## 2023-06-18 LAB — D-DIMER, QUANTITATIVE: D-Dimer, Quant: 4.61 ug{FEU}/mL — ABNORMAL HIGH (ref 0.00–0.50)

## 2023-06-18 LAB — MAGNESIUM: Magnesium: 1.6 mg/dL — ABNORMAL LOW (ref 1.7–2.4)

## 2023-06-18 LAB — BRAIN NATRIURETIC PEPTIDE: B Natriuretic Peptide: 309.7 pg/mL — ABNORMAL HIGH (ref 0.0–100.0)

## 2023-06-18 MED ORDER — SODIUM CHLORIDE 0.9 % IV SOLN: INTRAVENOUS | Status: DC

## 2023-06-18 MED ORDER — CARVEDILOL 6.25 MG PO TABS
6.2500 mg | ORAL_TABLET | Freq: Two times a day (BID) | ORAL | Status: DC
  Administered 2023-06-18: 6.25 mg via ORAL
  Filled 2023-06-18: qty 1

## 2023-06-18 MED ORDER — PANTOPRAZOLE SODIUM 40 MG PO TBEC
40.0000 mg | DELAYED_RELEASE_TABLET | Freq: Every day | ORAL | Status: DC
  Administered 2023-06-18 – 2023-06-23 (×6): 40 mg via ORAL
  Filled 2023-06-18 (×6): qty 1

## 2023-06-18 MED ORDER — PERFLUTREN LIPID MICROSPHERE
1.0000 mL | INTRAVENOUS | Status: AC | PRN
  Administered 2023-06-18: 3 mL via INTRAVENOUS

## 2023-06-18 MED ORDER — ACETAMINOPHEN 500 MG PO TABS
1000.0000 mg | ORAL_TABLET | Freq: Once | ORAL | Status: AC
  Administered 2023-06-18: 1000 mg via ORAL
  Filled 2023-06-18: qty 2

## 2023-06-18 MED ORDER — INSULIN GLARGINE-YFGN 100 UNIT/ML ~~LOC~~ SOLN
10.0000 [IU] | Freq: Every day | SUBCUTANEOUS | Status: DC
  Administered 2023-06-18: 10 [IU] via SUBCUTANEOUS
  Filled 2023-06-18: qty 0.1

## 2023-06-18 MED ORDER — DILTIAZEM HCL-DEXTROSE 125-5 MG/125ML-% IV SOLN (PREMIX)
5.0000 mg/h | INTRAVENOUS | Status: DC
  Administered 2023-06-18: 7.5 mg/h via INTRAVENOUS
  Administered 2023-06-18: 5 mg/h via INTRAVENOUS
  Filled 2023-06-18: qty 125

## 2023-06-18 MED ORDER — ONDANSETRON HCL 4 MG/2ML IJ SOLN
4.0000 mg | Freq: Once | INTRAMUSCULAR | Status: AC
Start: 2023-06-18 — End: 2023-06-18
  Administered 2023-06-18: 4 mg via INTRAVENOUS
  Filled 2023-06-18: qty 2

## 2023-06-18 MED ORDER — DOCUSATE SODIUM 100 MG PO CAPS
100.0000 mg | ORAL_CAPSULE | Freq: Every day | ORAL | Status: DC | PRN
  Administered 2023-06-23: 100 mg via ORAL
  Filled 2023-06-18: qty 1

## 2023-06-18 MED ORDER — ACETAMINOPHEN 500 MG PO TABS
500.0000 mg | ORAL_TABLET | Freq: Four times a day (QID) | ORAL | Status: DC | PRN
  Administered 2023-06-18 – 2023-06-23 (×11): 500 mg via ORAL
  Filled 2023-06-18 (×12): qty 1

## 2023-06-18 MED ORDER — POTASSIUM CHLORIDE CRYS ER 20 MEQ PO TBCR
40.0000 meq | EXTENDED_RELEASE_TABLET | Freq: Two times a day (BID) | ORAL | Status: AC
  Administered 2023-06-18 (×2): 40 meq via ORAL
  Filled 2023-06-18 (×2): qty 2

## 2023-06-18 MED ORDER — INSULIN ASPART 100 UNIT/ML IJ SOLN
0.0000 [IU] | Freq: Three times a day (TID) | INTRAMUSCULAR | Status: DC
  Administered 2023-06-18 (×2): 2 [IU] via SUBCUTANEOUS
  Administered 2023-06-18: 3 [IU] via SUBCUTANEOUS
  Administered 2023-06-19: 5 [IU] via SUBCUTANEOUS
  Administered 2023-06-19: 3 [IU] via SUBCUTANEOUS
  Administered 2023-06-20: 2 [IU] via SUBCUTANEOUS
  Administered 2023-06-20: 3 [IU] via SUBCUTANEOUS
  Administered 2023-06-20 – 2023-06-21 (×2): 5 [IU] via SUBCUTANEOUS
  Administered 2023-06-21 – 2023-06-22 (×3): 2 [IU] via SUBCUTANEOUS
  Administered 2023-06-22: 3 [IU] via SUBCUTANEOUS
  Administered 2023-06-22: 2 [IU] via SUBCUTANEOUS
  Administered 2023-06-23 (×2): 3 [IU] via SUBCUTANEOUS

## 2023-06-18 MED ORDER — METOPROLOL TARTRATE 25 MG PO TABS
25.0000 mg | ORAL_TABLET | Freq: Two times a day (BID) | ORAL | Status: DC
  Administered 2023-06-18: 25 mg via ORAL
  Filled 2023-06-18: qty 1

## 2023-06-18 MED ORDER — APIXABAN 5 MG PO TABS
5.0000 mg | ORAL_TABLET | Freq: Two times a day (BID) | ORAL | Status: DC
  Administered 2023-06-18 – 2023-06-23 (×12): 5 mg via ORAL
  Filled 2023-06-18 (×12): qty 1

## 2023-06-18 MED ORDER — MAGNESIUM SULFATE 2 GM/50ML IV SOLN
2.0000 g | Freq: Once | INTRAVENOUS | Status: AC
  Administered 2023-06-18: 2 g via INTRAVENOUS
  Filled 2023-06-18: qty 50

## 2023-06-18 MED ORDER — IOHEXOL 350 MG/ML SOLN
75.0000 mL | Freq: Once | INTRAVENOUS | Status: AC | PRN
  Administered 2023-06-18: 75 mL via INTRAVENOUS

## 2023-06-18 MED ORDER — ONDANSETRON HCL 4 MG/2ML IJ SOLN
4.0000 mg | Freq: Four times a day (QID) | INTRAMUSCULAR | Status: DC | PRN
Start: 2023-06-18 — End: 2023-06-24
  Administered 2023-06-18 – 2023-06-23 (×12): 4 mg via INTRAVENOUS
  Filled 2023-06-18 (×12): qty 2

## 2023-06-18 MED ORDER — LORAZEPAM 2 MG/ML IJ SOLN
0.5000 mg | Freq: Once | INTRAMUSCULAR | Status: AC
  Administered 2023-06-18: 0.5 mg via INTRAVENOUS
  Filled 2023-06-18: qty 1

## 2023-06-18 MED ORDER — ONDANSETRON HCL 4 MG/2ML IJ SOLN
4.0000 mg | Freq: Once | INTRAMUSCULAR | Status: AC
  Administered 2023-06-18: 4 mg via INTRAVENOUS
  Filled 2023-06-18: qty 2

## 2023-06-18 NOTE — Progress Notes (Addendum)
 Pt arrived to 4E, VSS Cardizem  drip was maxed out at 15, AAOx4 but memory impairment noted, called family they came to bedside, CHG done, oriented to unit, call light within reach.   Keane Passe, RN 06/18/2023   Head Laceration recorded and measured WOC team Notified, no followup orders for wound care.      Media Information

## 2023-06-18 NOTE — Plan of Care (Signed)
  Problem: Education: Goal: Ability to describe self-care measures that may prevent or decrease complications (Diabetes Survival Skills Education) will improve Outcome: Progressing   Problem: Coping: Goal: Ability to adjust to condition or change in health will improve Outcome: Progressing   Problem: Fluid Volume: Goal: Ability to maintain a balanced intake and output will improve Outcome: Progressing   Problem: Skin Integrity: Goal: Risk for impaired skin integrity will decrease Outcome: Progressing   Problem: Education: Goal: Knowledge of General Education information will improve Description: Including pain rating scale, medication(s)/side effects and non-pharmacologic comfort measures Outcome: Progressing   Problem: Clinical Measurements: Goal: Cardiovascular complication will be avoided Outcome: Progressing   Problem: Activity: Goal: Risk for activity intolerance will decrease Outcome: Progressing

## 2023-06-18 NOTE — Progress Notes (Signed)
 Chaplain responds to Level 2 fall on thinners while in ED and provides reassurance to pt, who repeats the word "help" and says little else. Chaplain holds her hand until she is taken to CT. No family present. No further spiritual care needs.

## 2023-06-18 NOTE — H&P (Addendum)
 History and Physical    Patient: [name] ZOX:[id number] DOB: 22-May-1932 DOA: 06/18/2023 DOS: the patient was seen and examined on 06/18/2023 PCP: [facility]  Patient coming from: ALF/ILF  Chief Complaint:  Chief Complaint  Patient presents with   Fall   HPI: [name] is a 89 y.o. female with medical history significant of CAD, AAA, s/p coiling of cerebral artery aneurysm, Afib, HTN, HLD, DM2, p/w unwitnessed fall and now with AFRVR.  Pt is a poor historian unable to provide adequate medical history. She is only able to report that she remembers going to the restroom last night, and waking up in the hospital. She is unaware of the immediate events preceding her arrival to the ED. Unfortunately, there is no answer by her daughter Stephenie Einstein in [place] or her facility [place] [facility].  From what I can gather per Epic review, pt was BIBA after being found down by staffing at 0330.  In the ED, the pt was hypertensive, tachypneic, and hyoxic (requiring 2L [place]). Labs notable for K 3.3, Mg 1.6, WBC 12. Head CT w/ NAICA. Pt noted to be in [facility] and admitted to medicine for ongoing care.  Review of Systems: As mentioned in the history of present illness. All other systems reviewed and are negative. Past Medical History:  Diagnosis Date   Abdominal pain, chronic, left lower quadrant    Acute torn meniscus of knee    Adenomatous colon polyp 1970   carcinoma in situ   Allergic rhinitis    Amaurosis fugax 08/07/2012   Anxiety    Ascending aortic aneurysm (HCC) 12/07/2015   44mm by chest CTA 08/2020   Benign essential tremor syndrome    Bicuspid aortic valve    no AS on 07/2019   Carotid stenosis    1039 bilateral by dopplers 03/2017.    colon ca dx'd 1970   surg only   Coronary artery calcification seen on CT scan 09/26/2022   - Cath in 2002 normal - Myoview in 2017 low risk - Admx in 12/2021 w AF w RVR and elevated hsT c/w NSTEMI - pt preferred conservative  mgmt (no cath)    DDD (degenerative disc disease)    Diverticulitis    Diverticulosis    DM (diabetes mellitus) (HCC)    Duodenitis    peptic, with gastric heterotopia   Endometriosis    s/p hysterectomy   Fundic gland polyposis of stomach    GERD (gastroesophageal reflux disease)    Heart murmur    Hiatal hernia 02/03/2005   History of cerebral aneurysm repair    s/p coiling   History of hemorrhoids    with bleeding   History of shingles    HLD (hyperlipidemia)    HTN (hypertension)    Hypercholesteremia    IBS (irritable bowel syndrome)    Iron deficiency    Lung nodule 07/12/2021   CT 05/2021: 3 mm right solid pulmonary nodule-no routine follow-up imaging recommended   Migraine    MVP (mitral valve prolapse)    Osteopenia    Peripheral neuropathy    Toe fracture, right    second toe   UTI (lower urinary tract infection)    Varicose vein    Past Surgical History:  Procedure Laterality Date   ABDOMINAL HYSTERECTOMY     APPENDECTOMY     CARPAL TUNNEL RELEASE  08/26/07   CATARACT EXTRACTION     CEREBRAL ANEURYSM REPAIR     COLON RESECTION  COLONOSCOPY  06/07/08   divertiulosis, internal hemorrhoids   COLONOSCOPY W/ BIOPSIES AND POLYPECTOMY  02/03/05   diverticulosis, 4 mm sessile polyps, internal and external hemorrhoids   CORONARY ANGIOPLASTY WITH STENT PLACEMENT     ESOPHAGOGASTRODUODENOSCOPY  02/03/05   hiatal hernia, 6 benign gastric polyps   FLEXIBLE SIGMOIDOSCOPY     HAND SURGERY Right    INTRAOCULAR LENS INSERTION     right hand decompressive fasciotomy  08/26/07   , dorsal and volar   TONSILLECTOMY     Social History:  reports that she has never smoked. She has never used smokeless tobacco. She reports that she does not drink alcohol and does not use drugs.  Allergies  Allergen Reactions   Actos [Pioglitazone] Other (See Comments)    Chronic pedal edema   Amoxil  [Amoxicillin ] Other (See Comments)    Stomach upset   Avandia [Rosiglitazone] Other (See  Comments)    Chronic pedal edema   Fosamax [Alendronate Sodium] Other (See Comments)    Unknown reaction   Ms Contin  [Morphine ] Other (See Comments)    Body spasms    Ultram  [Tramadol ] Shortness Of Breath and Palpitations   Amaryl [Glimepiride] Other (See Comments)    Unknown reaction   Januvia [Sitagliptin] Other (See Comments)    Unknown reaction   Lipitor [Atorvastatin] Swelling    Myalgias    Metformin And Related Other (See Comments)    Gastritis    Prandin [Repaglinide] Other (See Comments)    Abdominal pain   Tradjenta [Linagliptin] Other (See Comments)    GI issues   Zoloft [Sertraline Hcl] Other (See Comments)    Altered mental state   Asa [Aspirin ] Other (See Comments)    Bleeding ulcers    Bactrim [Sulfamethoxazole-Trimethoprim] Hives   Bentyl [Dicyclomine Hcl] Other (See Comments)    Near syncope Weakness    Cipro  [Ciprofloxacin  Hcl] Nausea And Vomiting and Other (See Comments)    Stomach upset   Hctz [Hydrochlorothiazide ] Other (See Comments)    Urinary issues   Keflex [Cephalexin] Other (See Comments)    Unknown reaction   Morphine  And Codeine Other (See Comments)    Body spasms   Norvasc  [Amlodipine  Besylate] Other (See Comments)    Weakness Dizziness    Oxycontin [Oxycodone] Other (See Comments)    Unknown reaction   Sulfa Antibiotics Hives   Penicillins Swelling and Rash    Family History  Problem Relation Age of Onset   Ovarian cancer Mother    Heart disease Father    Kidney disease Father    Diabetes Paternal Grandmother    Diabetes Brother    Hypertension Brother    Heart disease Brother    Diabetes Brother    Heart disease Brother    Microcephaly Brother    Diabetes Other    Colon cancer Other     Prior to Admission medications   Medication Sig Start Date End Date Taking? Authorizing Provider  acetaminophen  (TYLENOL ) 500 MG tablet Take 500 mg by mouth every 6 (six) hours as needed for mild pain, moderate pain, fever or headache.   Yes  [provider]  apixaban  (ELIQUIS ) 5 MG TABS tablet Take 1 tablet (5 mg total) by mouth 2 (two) times daily. 12/05/22  Yes Turner, Rufus Council, MD  docusate sodium  (COLACE) 100 MG capsule Take 100 mg by mouth daily as needed for moderate constipation.   Yes [provider]  furosemide  (LASIX ) 20 MG tablet Take one tab every Mon, Wed, Fri. Take one  additional tab if weight gain of 3+ lbs overnight 05/20/23  Yes Danford, Willis Harter, MD  hydrALAZINE  (APRESOLINE ) 10 MG tablet TAKE 1 TABLET BY MOUTH THREE TIMES A DAY Patient taking differently: Take 10 mg by mouth 4 (four) times daily. Give 1 tablet by mouth four times a day for hypertension. 02/20/23  Yes Turner, Rufus Council, MD  hydrALAZINE  (APRESOLINE ) 25 MG tablet Take 25 mg by mouth as needed (HTN). Give 1 tablet by mouth daily as needed for SBP >170.  Repeat BP in one hour after administration and notify Marijean Shouts, NP if BP remains >170   Yes [provider]  insulin  glargine (LANTUS ) 100 UNIT/ML Solostar Pen Inject 18 Units into the skin in the morning. 05/20/23  Yes Danford, Willis Harter, MD  LORazepam (ATIVAN) 0.5 MG tablet Take 0.5 mg by mouth every 8 (eight) hours as needed for anxiety.   Yes [provider]  melatonin 3 MG TABS tablet Take 3 mg by mouth at bedtime.   Yes [provider]  metoprolol  tartrate (LOPRESSOR ) 25 MG tablet Take 1 tablet (25 mg total) by mouth 2 (two) times daily. Patient taking differently: Take 25 mg by mouth 2 (two) times daily. Give 1 tablet by mouth two times a day for atrial fibrillation. Hold if SBP is <100 or HR is <60 05/20/23  Yes Danford, Willis Harter, MD  Multiple Vitamin (MULTIVITAMIN) tablet Take 1 tablet by mouth in the morning.   Yes [provider]  ondansetron  (ZOFRAN ) 4 MG tablet Take 1 tablet (4 mg total) by mouth every 8 (eight) hours as needed for nausea or vomiting. 05/20/23  Yes Danford, Willis Harter, MD  pantoprazole  (PROTONIX ) 40 MG tablet TAKE 1  TABLET BY MOUTH EVERY DAY BEFORE BREAKFAST Patient taking differently: Take 40 mg by mouth daily with supper. 02/03/20  Yes Kenney Peacemaker, MD  polyethylene glycol (MIRALAX  / GLYCOLAX ) 17 g packet Take 17 g by mouth daily as needed for moderate constipation. 05/20/23  Yes Danford, Willis Harter, MD  spironolactone  (ALDACTONE ) 25 MG tablet Take 0.5 tablets (12.5 mg total) by mouth daily. Patient taking differently: Take 12.5 mg by mouth 3 (three) times a week. Give 0.5 tablet by mouth one time a day every Tues, Thur, and Sat. 06/05/23  Yes Jacqueline Matsu, MD    Physical Exam: Vitals:   06/18/23 0645 06/18/23 0715 06/18/23 0730 06/18/23 0752  BP: (!) 213/83 (!) 164/117  (!) 203/115  Pulse: 80 (!) 155 (!) 114 (!) 143  Resp: (!) 28 (!) 23 14 (!) 31  Temp:    98.2 F (36.8 C)  TempSrc:    Oral  SpO2: 100% 98% 98% 99%   General: Alert, oriented x3, resting comfortably in no acute distress Respiratory: Lungs clear to auscultation bilaterally with normal respiratory effort; no w/r/r Cardiovascular: Irregular irregular; w/o murmurs  Data Reviewed:  Lab Results  Component Value Date   WBC 12.0 (H) 06/18/2023   HGB 11.3 (L) 06/18/2023   HCT 36.7 06/18/2023   MCV 79.3 (L) 06/18/2023   PLT 202 06/18/2023   Lab Results  Component Value Date   GLUCOSE 162 (H) 06/18/2023   CALCIUM 9.0 06/18/2023   NA 142 06/18/2023   K 3.3 (L) 06/18/2023   CO2 25 06/18/2023   CL 103 06/18/2023   BUN 15 06/18/2023   CREATININE 0.95 06/18/2023   Lab Results  Component Value Date   ALT 15 06/18/2023   AST 23 06/18/2023   ALKPHOS 61 06/18/2023  BILITOT 0.6 06/18/2023   Lab Results  Component Value Date   INR 1.1 01/01/2022   INR 1.05 01/31/2016   INR 0.97 04/01/2014    Radiology: DG Chest Portable 1 View Result Date: 06/18/2023 CLINICAL DATA:  Syncopal episode. EXAM: PORTABLE CHEST 1 VIEW COMPARISON:  Portable chest 05/19/2023. FINDINGS: The lungs are clear with mild chronic elevation of the  right diaphragm. The inferior left CP sulcus was excluded from the exam. The heart is enlarged but unchanged. No vascular congestion is seen. Stable mediastinum with aortic tortuosity and calcification. There is a tangle of overlying monitor wiring. No new osseous findings. Osteopenia. IMPRESSION: No evidence of acute chest disease. Stable cardiomegaly. Aortic atherosclerosis. Electronically Signed   By: Denman Fischer M.D.   On: 06/18/2023 07:24   CT Head Wo Contrast Result Date: 06/18/2023 CLINICAL DATA:  Marvell Slider and hit back of head and neck trauma. EXAM: CT HEAD WITHOUT CONTRAST CT CERVICAL SPINE WITHOUT CONTRAST TECHNIQUE: Multidetector CT imaging of the head and cervical spine was performed following the standard protocol without intravenous contrast. Multiplanar CT image reconstructions of the cervical spine were also generated. RADIATION DOSE REDUCTION: This exam was performed according to the departmental dose-optimization program which includes automated exposure control, adjustment of the mA and/or kV according to patient size and/or use of iterative reconstruction technique. COMPARISON:  Head CT 06/30/2021, CTA and neck and reformats 01/16/2018. FINDINGS: CT HEAD FINDINGS Brain: No hemorrhage, mass effect or acute infarct is seen. There are dystrophic calcifications in the falx. There is mild atrophy, small-vessel disease and atrophic ventriculomegaly, and a chronic linear infarct in the inferior left cerebellar hemisphere. There is no midline shift.  The basal cisterns are clear Vascular: There is spray artifact from aneurysm coils again noted in the right cavernous sinus region and there appears to have been a prior attempted stenting of the right petrous ICA. No hyperdense central vessel is seen through the metal artifacts. There are carotid calcifications. Skull: Negative for fractures or focal lesions. Hyperostosis frontalis interna. Large dorsal right parieto-occipital scalp hematoma.  Sinuses/Orbits: No acute findings. S shaped nasal septum. Old lens replacements. Other: None. CT CERVICAL SPINE FINDINGS Alignment: The patient's head is turned to the right but there is no C1-2 offset. There is no widening of the anterior atlanto dental joint. There is chronic minimal degenerative anterolisthesis at C3-4, C4-5 and C7-T1, seen previously. Mild reversed lordosis. There is no traumatic or new listhesis. Skull base and vertebrae: No acute fracture is evident. There is generalized osteopenia. No primary bone lesion or focal pathologic process. Soft tissues and spinal canal: No prevertebral fluid or swelling. No visible canal hematoma. There is mild calcification in the proximal left cervical ICA. No thyroid  or laryngeal mass. Disc levels: there is disc space loss c3-4 through c6-7 with bidirectional endplate osteophytes, normal disc heights c2-3 and c7-t1. There are mild posterior disc osteophyte complexes from C3-4 through C6-7, slightly deforming the ventral cord surface without frank compression. Moderate facet hypertrophy is seen along with uncinate spurring. Acquired foraminal stenosis is moderate to severe on the right and mild on the left at C3-4, bilaterally mild C4-5, moderate on the left and mild on the right at C5-6, mild on the right at C6-7. Upper chest: There is bilateral apical scarring change. Otherwise negative. Other: None. IMPRESSION: 1. No acute intracranial CT findings or depressed skull fractures. 2. Large dorsal right parieto-occipital scalp hematoma. 3. Atrophy, small-vessel disease and chronic linear left cerebral infarct 4. Aneurysm coils in the right  cavernous sinus region and prior attempted stenting of the right petrous ICA. 5. Osteopenia and degenerative change without evidence of cervical fractures. 6. Reversed lordosis and minimal degenerative listhesis. 7. Carotid calcifications. Electronically Signed   By: Denman Fischer M.D.   On: 06/18/2023 05:36   CT Cervical Spine  Wo Contrast Result Date: 06/18/2023 CLINICAL DATA:  Marvell Slider and hit back of head and neck trauma. EXAM: CT HEAD WITHOUT CONTRAST CT CERVICAL SPINE WITHOUT CONTRAST TECHNIQUE: Multidetector CT imaging of the head and cervical spine was performed following the standard protocol without intravenous contrast. Multiplanar CT image reconstructions of the cervical spine were also generated. RADIATION DOSE REDUCTION: This exam was performed according to the departmental dose-optimization program which includes automated exposure control, adjustment of the mA and/or kV according to patient size and/or use of iterative reconstruction technique. COMPARISON:  Head CT 06/30/2021, CTA and neck and reformats 01/16/2018. FINDINGS: CT HEAD FINDINGS Brain: No hemorrhage, mass effect or acute infarct is seen. There are dystrophic calcifications in the falx. There is mild atrophy, small-vessel disease and atrophic ventriculomegaly, and a chronic linear infarct in the inferior left cerebellar hemisphere. There is no midline shift.  The basal cisterns are clear Vascular: There is spray artifact from aneurysm coils again noted in the right cavernous sinus region and there appears to have been a prior attempted stenting of the right petrous ICA. No hyperdense central vessel is seen through the metal artifacts. There are carotid calcifications. Skull: Negative for fractures or focal lesions. Hyperostosis frontalis interna. Large dorsal right parieto-occipital scalp hematoma. Sinuses/Orbits: No acute findings. S shaped nasal septum. Old lens replacements. Other: None. CT CERVICAL SPINE FINDINGS Alignment: The patient's head is turned to the right but there is no C1-2 offset. There is no widening of the anterior atlanto dental joint. There is chronic minimal degenerative anterolisthesis at C3-4, C4-5 and C7-T1, seen previously. Mild reversed lordosis. There is no traumatic or new listhesis. Skull base and vertebrae: No acute fracture is  evident. There is generalized osteopenia. No primary bone lesion or focal pathologic process. Soft tissues and spinal canal: No prevertebral fluid or swelling. No visible canal hematoma. There is mild calcification in the proximal left cervical ICA. No thyroid  or laryngeal mass. Disc levels: there is disc space loss c3-4 through c6-7 with bidirectional endplate osteophytes, normal disc heights c2-3 and c7-t1. There are mild posterior disc osteophyte complexes from C3-4 through C6-7, slightly deforming the ventral cord surface without frank compression. Moderate facet hypertrophy is seen along with uncinate spurring. Acquired foraminal stenosis is moderate to severe on the right and mild on the left at C3-4, bilaterally mild C4-5, moderate on the left and mild on the right at C5-6, mild on the right at C6-7. Upper chest: There is bilateral apical scarring change. Otherwise negative. Other: None. IMPRESSION: 1. No acute intracranial CT findings or depressed skull fractures. 2. Large dorsal right parieto-occipital scalp hematoma. 3. Atrophy, small-vessel disease and chronic linear left cerebral infarct 4. 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 5. Osteopenia and degenerative change without evidence of cervical fractures. 6. Reversed lordosis and minimal degenerative listhesis. 7. Carotid calcifications. Electronically Signed   By: Denman Fischer M.D.   On: 06/18/2023 05:36    Assessment and Plan: 55F h/o CAD, AAA, s/p coiling of cerebral artery aneurysm, Afib, HTN, HLD, DM2, p/w unwitnesed fall and now with AFRVR.  AFRVR GLF Presumed syncope Pt presented with unwitnessed GLF. Noted to  have AFRVR in ED. Missed AM doses of metoprolol  pta. CT head NAICA. CXR neg for PNA. Urine culture pending. -PT consulted; apprec eval/recs -Tele -Continue IV diltiazem ; titrate prn -D/c PTA metoprolol  25mg  BID -Start coreg  6.25mg  BID for rate and BP  control -MIVF: NS at 75cc/h for 24h -F/u orthostatic VS -F/u TTE -F/u urine and blood cultures to r/o infectious etiologies for AFRVR  R posterior scalp laceration -WOC consulted; apprec eval/recs  Hypokalemia Hypomagnesemia -Replete prn  DM2 -SSI alone for now; consider resuming long-acting insulin  prn  Advance Care Planning:   Code Status: Full Code   Consults: N/A  Family Communication: N/A  Severity of Illness: The appropriate patient status for this patient is INPATIENT. Inpatient status is judged to be reasonable and necessary in order to provide the required intensity of service to ensure the patient's safety. The patient's presenting symptoms, physical exam findings, and initial radiographic and laboratory data in the context of their chronic comorbidities is felt to place them at high risk for further clinical deterioration. Furthermore, it is not anticipated that the patient will be medically stable for discharge from the hospital within 2 midnights of admission.   * I certify that at the point of admission it is my clinical judgment that the patient will require inpatient hospital care spanning beyond 2 midnights from the point of admission due to high intensity of service, high risk for further deterioration and high frequency of surveillance required.*  Author: Arne Langdon, MD 06/18/2023 7:56 AM  For on call review [email or web address].

## 2023-06-18 NOTE — Plan of Care (Signed)

## 2023-06-18 NOTE — Progress Notes (Signed)
  Echocardiogram 2D Echocardiogram has been performed.  Dunia Pringle 06/18/2023, 6:16 PM

## 2023-06-18 NOTE — ED Provider Notes (Signed)
 [place] EMERGENCY DEPARTMENT AT [facility] Provider Note   CSN: [id number] Arrival date & time: 06/18/23  0409     History  Chief Complaint  Patient presents with   [name]    [name] is a 89 y.o. female.  Patient with past medical history significant for hypertension, ascending aortic aneurysm, IBS, anxiety, type II DM, atrial fibrillation currently anticoagulated on Eliquis  presents to the emergency department as a level 2 trauma due to an unwitnessed fall on blood thinners.  Patient with a hematoma and small laceration to the back of the scalp.  Patient is normally conscious alert and oriented x 3 according to facility staff and according to chart review but patient is currently only oriented to person.  She will respond to her name but only says "help".  She does not answer yes or no to any review of systems questions.  Level 5 caveat due to patient's current mental status.  C-collar in place upon arrival placed by EMS.   Fall       Home Medications Prior to Admission medications   Medication Sig Start Date End Date Taking? Authorizing Provider  ACCU-CHEK GUIDE TEST test strip 1 each by Other route daily. 03/20/23   [provider]  acetaminophen  (TYLENOL ) 500 MG tablet Take 500 mg by mouth every 6 (six) hours as needed for mild pain, moderate pain, fever or headache.    [provider]  apixaban  (ELIQUIS ) 5 MG TABS tablet Take 1 tablet (5 mg total) by mouth 2 (two) times daily. 12/05/22   Jacqueline Matsu, MD  docusate sodium  (COLACE) 100 MG capsule Take 100 mg by mouth daily as needed for moderate constipation.    [provider]  furosemide  (LASIX ) 20 MG tablet Take one tab every Mon, Wed, Fri. Take one additional tab if weight gain of 3+ lbs overnight 05/20/23   Danford, Willis Harter, MD  hydrALAZINE  (APRESOLINE ) 10 MG tablet TAKE 1 TABLET BY MOUTH THREE TIMES A DAY 02/20/23   Jacqueline Matsu, MD  insulin  glargine (LANTUS ) 100 UNIT/ML  Solostar Pen Inject 18 Units into the skin in the morning. 05/20/23   Danford, Willis Harter, MD  metoprolol  tartrate (LOPRESSOR ) 25 MG tablet Take 1 tablet (25 mg total) by mouth 2 (two) times daily. 05/20/23   Danford, Willis Harter, MD  Multiple Vitamin (MULTIVITAMIN) tablet Take 1 tablet by mouth in the morning.    [provider]  ondansetron  (ZOFRAN ) 4 MG tablet Take 1 tablet (4 mg total) by mouth every 8 (eight) hours as needed for nausea or vomiting. 05/20/23   Danford, Willis Harter, MD  pantoprazole  (PROTONIX ) 40 MG tablet TAKE 1 TABLET BY MOUTH EVERY DAY BEFORE BREAKFAST 02/03/20   Kenney Peacemaker, MD  polyethylene glycol (MIRALAX  / GLYCOLAX ) 17 g packet Take 17 g by mouth daily as needed for moderate constipation. 05/20/23   Danford, Willis Harter, MD  spironolactone  (ALDACTONE ) 25 MG tablet Take 0.5 tablets (12.5 mg total) by mouth daily. 06/05/23   Jacqueline Matsu, MD      Allergies    Actos [pioglitazone], Amoxil  [amoxicillin ], Avandia [rosiglitazone], Fosamax [alendronate sodium], Ms contin  [morphine ], Ultram  [tramadol ], Amaryl [glimepiride], Januvia [sitagliptin], Lipitor [atorvastatin], Metformin and related, Prandin [repaglinide], Tradjenta [linagliptin], Zoloft [sertraline hcl], Asa [aspirin ], Bactrim [sulfamethoxazole-trimethoprim], Bentyl [dicyclomine hcl], Cipro  [ciprofloxacin  hcl], Hctz [hydrochlorothiazide ], Keflex [cephalexin], Morphine  and codeine, Norvasc  [amlodipine  besylate], Oxycontin [oxycodone], Sulfa antibiotics, and Penicillins    Review of Systems   Review of Systems  Physical Exam Updated Vital Signs BP (!) 214/87   Pulse 75   Temp 97.6 F (36.4 C)   Resp (!) 27   SpO2 91%  Physical Exam Vitals and nursing note reviewed.  Constitutional:      General: She is not in acute distress.    Appearance: She is well-developed.  HENT:     Head: Normocephalic.     Comments: Small hematoma noted to the posterior scalp on the patient's right side Eyes:      Conjunctiva/sclera: Conjunctivae normal.  Cardiovascular:     Rate and Rhythm: Normal rate and regular rhythm.  Pulmonary:     Effort: Pulmonary effort is normal. No respiratory distress.     Breath sounds: Normal breath sounds.  Abdominal:     Palpations: Abdomen is soft.     Tenderness: There is no abdominal tenderness.  Musculoskeletal:        General: No swelling.     Cervical back: Neck supple.  Skin:    General: Skin is warm and dry.     Capillary Refill: Capillary refill takes less than 2 seconds.  Neurological:     Mental Status: She is alert.     Comments: Patient alert only to person, does not follow commands     ED Results / Procedures / Treatments   Labs (all labs ordered are listed, but only abnormal results are displayed) Labs Reviewed  COMPREHENSIVE METABOLIC PANEL WITH GFR - Abnormal; Notable for the following components:      Result Value   Potassium 3.3 (*)    Glucose, Bld 162 (*)    GFR, Estimated 58 (*)    All other components within normal limits  CBC WITH DIFFERENTIAL/PLATELET - Abnormal; Notable for the following components:   WBC 12.0 (*)    Hemoglobin 11.3 (*)    MCV 79.3 (*)    MCH 24.4 (*)    RDW 16.3 (*)    Lymphs Abs 5.3 (*)    Abs Immature Granulocytes 0.09 (*)    All other components within normal limits  MAGNESIUM  - Abnormal; Notable for the following components:   Magnesium  1.6 (*)    All other components within normal limits  URINALYSIS, ROUTINE W REFLEX MICROSCOPIC    EKG None  Radiology CT Head Wo Contrast Result Date: 06/18/2023 CLINICAL DATA:  Marvell Slider and hit back of head and neck trauma. EXAM: CT HEAD WITHOUT CONTRAST CT CERVICAL SPINE WITHOUT CONTRAST TECHNIQUE: Multidetector CT imaging of the head and cervical spine was performed following the standard protocol without intravenous contrast. Multiplanar CT image reconstructions of the cervical spine were also generated. RADIATION DOSE REDUCTION: This exam was performed according  to the departmental dose-optimization program which includes automated exposure control, adjustment of the mA and/or kV according to patient size and/or use of iterative reconstruction technique. COMPARISON:  Head CT 06/30/2021, CTA and neck and reformats 01/16/2018. FINDINGS: CT HEAD FINDINGS Brain: No hemorrhage, mass effect or acute infarct is seen. There are dystrophic calcifications in the falx. There is mild atrophy, small-vessel disease and atrophic ventriculomegaly, and a chronic linear infarct in the inferior left cerebellar hemisphere. There is no midline shift.  The basal cisterns are clear Vascular: There is spray artifact from aneurysm coils again noted in the right cavernous sinus region and there appears to have been a prior attempted stenting of the right petrous ICA. No hyperdense central vessel is seen through the metal artifacts. There are carotid calcifications. Skull: Negative for fractures or focal lesions.  Hyperostosis frontalis interna. Large dorsal right parieto-occipital scalp hematoma. Sinuses/Orbits: No acute findings. S shaped nasal septum. Old lens replacements. Other: None. CT CERVICAL SPINE FINDINGS Alignment: The patient's head is turned to the right but there is no C1-2 offset. There is no widening of the anterior atlanto dental joint. There is chronic minimal degenerative anterolisthesis at C3-4, C4-5 and C7-T1, seen previously. Mild reversed lordosis. There is no traumatic or new listhesis. Skull base and vertebrae: No acute fracture is evident. There is generalized osteopenia. No primary bone lesion or focal pathologic process. Soft tissues and spinal canal: No prevertebral fluid or swelling. No visible canal hematoma. There is mild calcification in the proximal left cervical ICA. No thyroid  or laryngeal mass. Disc levels: there is disc space loss c3-4 through c6-7 with bidirectional endplate osteophytes, normal disc heights c2-3 and c7-t1. There are mild posterior disc  osteophyte complexes from C3-4 through C6-7, slightly deforming the ventral cord surface without frank compression. Moderate facet hypertrophy is seen along with uncinate spurring. Acquired foraminal stenosis is moderate to severe on the right and mild on the left at C3-4, bilaterally mild C4-5, moderate on the left and mild on the right at C5-6, mild on the right at C6-7. Upper chest: There is bilateral apical scarring change. Otherwise negative. Other: None. IMPRESSION: 1. No acute intracranial CT findings or depressed skull fractures. 2. Large dorsal right parieto-occipital scalp hematoma. 3. Atrophy, small-vessel disease and chronic linear left cerebral infarct 4. 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 5. Osteopenia and degenerative change without evidence of cervical fractures. 6. Reversed lordosis and minimal degenerative listhesis. 7. Carotid calcifications. Electronically Signed   By: Denman Fischer M.D.   On: 06/18/2023 05:36   CT Cervical Spine Wo Contrast Result Date: 06/18/2023 CLINICAL DATA:  Marvell Slider and hit back of head and neck trauma. EXAM: CT HEAD WITHOUT CONTRAST CT CERVICAL SPINE WITHOUT CONTRAST TECHNIQUE: Multidetector CT imaging of the head and cervical spine was performed following the standard protocol without intravenous contrast. Multiplanar CT image reconstructions of the cervical spine were also generated. RADIATION DOSE REDUCTION: This exam was performed according to the departmental dose-optimization program which includes automated exposure control, adjustment of the mA and/or kV according to patient size and/or use of iterative reconstruction technique. COMPARISON:  Head CT 06/30/2021, CTA and neck and reformats 01/16/2018. FINDINGS: CT HEAD FINDINGS Brain: No hemorrhage, mass effect or acute infarct is seen. There are dystrophic calcifications in the falx. There is mild atrophy, small-vessel disease and atrophic  ventriculomegaly, and a chronic linear infarct in the inferior left cerebellar hemisphere. There is no midline shift.  The basal cisterns are clear Vascular: There is spray artifact from aneurysm coils again noted in the right cavernous sinus region and there appears to have been a prior attempted stenting of the right petrous ICA. No hyperdense central vessel is seen through the metal artifacts. There are carotid calcifications. Skull: Negative for fractures or focal lesions. Hyperostosis frontalis interna. Large dorsal right parieto-occipital scalp hematoma. Sinuses/Orbits: No acute findings. S shaped nasal septum. Old lens replacements. Other: None. CT CERVICAL SPINE FINDINGS Alignment: The patient's head is turned to the right but there is no C1-2 offset. There is no widening of the anterior atlanto dental joint. There is chronic minimal degenerative anterolisthesis at C3-4, C4-5 and C7-T1, seen previously. Mild reversed lordosis. There is no traumatic or new listhesis. Skull base and vertebrae: No acute fracture is evident. There is  generalized osteopenia. No primary bone lesion or focal pathologic process. Soft tissues and spinal canal: No prevertebral fluid or swelling. No visible canal hematoma. There is mild calcification in the proximal left cervical ICA. No thyroid  or laryngeal mass. Disc levels: there is disc space loss c3-4 through c6-7 with bidirectional endplate osteophytes, normal disc heights c2-3 and c7-t1. There are mild posterior disc osteophyte complexes from C3-4 through C6-7, slightly deforming the ventral cord surface without frank compression. Moderate facet hypertrophy is seen along with uncinate spurring. Acquired foraminal stenosis is moderate to severe on the right and mild on the left at C3-4, bilaterally mild C4-5, moderate on the left and mild on the right at C5-6, mild on the right at C6-7. Upper chest: There is bilateral apical scarring change. Otherwise negative. Other: None.  IMPRESSION: 1. No acute intracranial CT findings or depressed skull fractures. 2. Large dorsal right parieto-occipital scalp hematoma. 3. Atrophy, small-vessel disease and chronic linear left cerebral infarct 4. 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 5. Osteopenia and degenerative change without evidence of cervical fractures. 6. Reversed lordosis and minimal degenerative listhesis. 7. Carotid calcifications. Electronically Signed   By: Denman Fischer M.D.   On: 06/18/2023 05:36    Procedures Procedures    Medications Ordered in ED Medications  ondansetron  (ZOFRAN ) injection 4 mg (has no administration in time range)  acetaminophen  (TYLENOL ) tablet 1,000 mg (has no administration in time range)  ondansetron  (ZOFRAN ) injection 4 mg (4 mg Intravenous Given 06/18/23 0443)    ED Course/ Medical Decision Making/ A&P                                 Medical Decision Making Amount and/or Complexity of Data Reviewed Labs: ordered. Radiology: ordered.  Risk Prescription drug management.   This patient presents to the ED for concern of unwitnessed fall and altered mental status, this involves an extensive number of treatment options, and is a complaint that carries with it a high risk of complications and morbidity.  The differential diagnosis includes intracranial abnormality, infection, metabolic abnormality, fracture, dislocation, others   Co morbidities that complicate the patient evaluation  Anxiety, type II DM   Additional history obtained:  Additional history obtained from patient's daughter, EMS External records from outside source obtained and reviewed including recent admission note   Lab Tests:  I Ordered, and personally interpreted labs.  The pertinent results include: Mild leukocytosis with a white count of 12,000   Imaging Studies ordered:  I ordered imaging studies including CT head and C-spine  without contrast I independently visualized and interpreted imaging which showed  1. No acute intracranial CT findings or depressed skull fractures.  2. Large dorsal right parieto-occipital scalp hematoma.  3. Atrophy, small-vessel disease and chronic linear left cerebral  infarct  4. 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  5. Osteopenia and degenerative change without evidence of cervical  fractures.  6. Reversed lordosis and minimal degenerative listhesis.  7. Carotid calcifications.   I agree with the radiologist interpretation   Cardiac Monitoring: / EKG:  The patient was maintained on a cardiac monitor.  I personally viewed and interpreted the cardiac monitored which showed an underlying rhythm of: Sinus rhythm   Problem List / ED Course / Critical interventions / Medication management   I ordered  medication including Zofran  for nausea, tylenol  for headache Reevaluation of the patient after these medicines showed that the patient improved I have reviewed the patients home medicines and have made adjustments as needed   Test / Admission - Considered:  No acute findings on head CT to explain patient's altered mental status. Patient's daughter endorses that patient has been feeling severely stressed due to the recent passing of her husband. Patient care being transferred at this time to [facility], PA-C. Urinalysis pending. Disposition pending lab results and reassessment.          Final Clinical Impression(s) / ED Diagnoses Final diagnoses:  Fall, initial encounter  Hematoma of scalp, initial encounter  Altered mental status, unspecified altered mental status type    Rx / DC Orders ED Discharge Orders     None         Delories Fetter 06/18/23 9604    Edson Graces, MD 06/18/23 [phone]

## 2023-06-18 NOTE — ED Provider Notes (Signed)
  Physical Exam  BP (!) 146/114   Pulse (!) 52   Temp 98.2 F (36.8 C) (Oral)   Resp (!) 34   SpO2 98%   Physical Exam Vitals and nursing note reviewed.  Constitutional:      Appearance: She is ill-appearing.  HENT:     Head: Normocephalic.  Eyes:     Pupils: Pupils are equal, round, and reactive to light.  Cardiovascular:     Rate and Rhythm: Normal rate.  Pulmonary:     Effort: Pulmonary effort is normal.  Abdominal:     General: Abdomen is flat.  Neurological:     Mental Status: She is alert. Mental status is at baseline.     Procedures  Procedures  ED Course / MDM   Clinical Course as of 06/18/23 2725  [name] Jun 18, 2023  3664 [name]): MODERATE [JS]  0722 WBC, UA: 21-50 [JS]    Clinical Course User Index [JS] Hailey Schuelke, PA-C   Medical Decision Making Amount and/or Complexity of Data Reviewed Labs: ordered. Decision-making details documented in ED Course. Radiology: ordered.  Risk OTC drugs. Prescription drug management. Decision regarding hospitalization.   Patient care assumed from [facility]. PA at shift change, please see note for a full HPI. Briefly, patient here s/p f unwitnessed fall at independent facility.  Here with a negative workup thus far, she is anticoagulated on Eliquis .  CBC with a slight leukocytosis, hemoglobin is 11.3, CMP with slight decrease in potassium but otherwise stable.  Magnesium  is normal.  Given Zofran  for nausea, along with Tylenol  for headache.  This patient usually is Aox4, but mental status is improving. CT HEAD/Cervical spine without any acute findings.  Plan: Admission for encephalopathy, and UA to r/o infection.   Facility has not been called.   7:33 AM informed by nurse [place] back patient in Afib with a HR in the upper ~100, BP is also elevated therefore Diltiazem  infusion was provided. UA with leukocytes and WBC 21-50 will send this for culture.   7:56 AM I spoke to stepdaughter at the bedside, patient is  now answering questions, she is ANO x 4, I did speak to hospitalist in order to have patient admitted for her A-fib with RVR, with a heart rate in the 140s.  Dealt infusion has been started.  Portions of this note were generated with Scientist, clinical (histocompatibility and immunogenetics). Dictation errors may occur despite best attempts at proofreading.         Hailey Coad, PA-C 06/18/23 4034    [name], Hailey M, MD 06/19/23 [phone]

## 2023-06-18 NOTE — ED Notes (Signed)
 [facility] sent down including blood bank

## 2023-06-18 NOTE — ED Triage Notes (Signed)
 Patient coming from [place] with an unwitnessed fall. Facility stated to EMS that patient normally answers all question appropriately, but with EMS she only stated her name and "help me". EMS noted a hematoma and laceration on the back of patient's head. EMS VS 108/70 BP 155 CBG 97% 70 HR

## 2023-06-18 NOTE — ED Notes (Addendum)
 This nurse updated PA Abelardo Hoehn regarding patient's elevated BP and new onset of nausea.

## 2023-06-18 NOTE — Consult Note (Signed)
 WOC Nurse Consult Note: Reason for Consult: head hematoma and laceration Image requested  Wound type:trauma Wound bed: dried blood Pressure Injury POA: NA  Dressing procedure/placement/frequency: No topical care needed    Discussed POC with  bedside nurse.  Re consult if needed, will not follow at this time. Thanks  Lekesha Claw [employer], RN,CWOCN, CNS, CWON-AP [phone])

## 2023-06-19 DIAGNOSIS — I4891 Unspecified atrial fibrillation: Secondary | ICD-10-CM | POA: Diagnosis not present

## 2023-06-19 LAB — GLUCOSE, CAPILLARY
Glucose-Capillary: 166 mg/dL — ABNORMAL HIGH (ref 70–99)
Glucose-Capillary: 207 mg/dL — ABNORMAL HIGH (ref 70–99)
Glucose-Capillary: 120 mg/dL — ABNORMAL HIGH (ref 70–99)
Glucose-Capillary: 141 mg/dL — ABNORMAL HIGH (ref 70–99)

## 2023-06-19 LAB — CBC
HCT: 33.5 % — ABNORMAL LOW (ref 36.0–46.0)
Hemoglobin: 10.2 g/dL — ABNORMAL LOW (ref 12.0–15.0)
MCH: 24.1 pg — ABNORMAL LOW (ref 26.0–34.0)
MCHC: 30.4 g/dL (ref 30.0–36.0)
MCV: 79.2 fL — ABNORMAL LOW (ref 80.0–100.0)
Platelets: 202 10*3/uL (ref 150–400)
RBC: 4.23 MIL/uL (ref 3.87–5.11)
RDW: 16.2 % — ABNORMAL HIGH (ref 11.5–15.5)
WBC: 9.7 10*3/uL (ref 4.0–10.5)
nRBC: 0 % (ref 0.0–0.2)

## 2023-06-19 LAB — BASIC METABOLIC PANEL WITH GFR
Anion gap: 8 (ref 5–15)
BUN: 13 mg/dL (ref 8–23)
CO2: 27 mmol/L (ref 22–32)
Calcium: 8.9 mg/dL (ref 8.9–10.3)
Chloride: 106 mmol/L (ref 98–111)
Creatinine, Ser: 0.99 mg/dL (ref 0.44–1.00)
GFR, Estimated: 55 mL/min — ABNORMAL LOW (ref >60)
Glucose, Bld: 120 mg/dL — ABNORMAL HIGH (ref 70–99)
Potassium: 4.9 mmol/L (ref 3.5–5.1)
Sodium: 141 mmol/L (ref 135–145)

## 2023-06-19 LAB — URINE CULTURE: Culture: NO GROWTH

## 2023-06-19 LAB — ECHOCARDIOGRAM COMPLETE: S' Lateral: 2.8 cm

## 2023-06-19 LAB — MAGNESIUM: Magnesium: 2.1 mg/dL (ref 1.7–2.4)

## 2023-06-19 MED ORDER — HYDRALAZINE HCL 10 MG PO TABS
10.0000 mg | ORAL_TABLET | Freq: Two times a day (BID) | ORAL | Status: DC
  Administered 2023-06-19 – 2023-06-23 (×8): 10 mg via ORAL
  Filled 2023-06-19 (×9): qty 1

## 2023-06-19 MED ORDER — INSULIN GLARGINE-YFGN 100 UNIT/ML ~~LOC~~ SOPN
10.0000 [IU] | PEN_INJECTOR | Freq: Every day | SUBCUTANEOUS | Status: DC
  Filled 2023-06-19: qty 3

## 2023-06-19 MED ORDER — HYDRALAZINE HCL 10 MG PO TABS
10.0000 mg | ORAL_TABLET | Freq: Three times a day (TID) | ORAL | Status: DC
  Administered 2023-06-19: 10 mg via ORAL
  Filled 2023-06-19: qty 1

## 2023-06-19 MED ORDER — MELATONIN 3 MG PO TABS
3.0000 mg | ORAL_TABLET | Freq: Every day | ORAL | Status: DC
  Administered 2023-06-19 – 2023-06-23 (×5): 3 mg via ORAL
  Filled 2023-06-19 (×5): qty 1

## 2023-06-19 MED ORDER — CARVEDILOL 12.5 MG PO TABS
12.5000 mg | ORAL_TABLET | Freq: Two times a day (BID) | ORAL | Status: DC
  Administered 2023-06-19 – 2023-06-20 (×3): 12.5 mg via ORAL
  Filled 2023-06-19 (×3): qty 1

## 2023-06-19 MED ORDER — LORAZEPAM 0.5 MG PO TABS
0.5000 mg | ORAL_TABLET | Freq: Every day | ORAL | Status: DC | PRN
  Administered 2023-06-19 – 2023-06-23 (×5): 0.5 mg via ORAL
  Filled 2023-06-19 (×5): qty 1

## 2023-06-19 MED ORDER — ACETAMINOPHEN 500 MG PO TABS
500.0000 mg | ORAL_TABLET | Freq: Once | ORAL | Status: AC
  Administered 2023-06-19: 500 mg via ORAL
  Filled 2023-06-19: qty 1

## 2023-06-19 MED ORDER — INSULIN GLARGINE-YFGN 100 UNIT/ML ~~LOC~~ SOLN
10.0000 [IU] | Freq: Every day | SUBCUTANEOUS | Status: DC
  Administered 2023-06-19 – 2023-06-23 (×5): 10 [IU] via SUBCUTANEOUS
  Filled 2023-06-19 (×6): qty 0.1

## 2023-06-19 MED ORDER — FUROSEMIDE 10 MG/ML IJ SOLN
40.0000 mg | Freq: Every day | INTRAMUSCULAR | Status: DC
  Administered 2023-06-19: 40 mg via INTRAVENOUS
  Filled 2023-06-19: qty 4

## 2023-06-19 NOTE — Progress Notes (Addendum)
 PROGRESS NOTE    [name]  GEX:[id number] DOB: February 21, 1932 DOA: 06/18/2023 PCP: [facility]   Brief Narrative: 89 year old with past medical history significant for CAD, AAA, status post coiling of cerebral artery aneurysm, A-fib, hypertension, hyperlipidemia, diabetes presents with unwitnessed fall, was found to be in A-fib with RVR.  Patient is a poor historian.  She remember going to the restroom last night and waking up in the hospital.    Assessment & Plan:   Principal Problem:   Atrial fibrillation with rapid ventricular response (HCC)   1-A-fib with RVR Presumed syncope: Patient presented with unwitnessed fall found to be in A-fib with RVR She missed a.m. dose of metoprolol  prior to admission. CT head: No acute intracranial abnormalities. Large dorsal right parieto-occipital scalp hematoma.  Chest x-ray, urine culture PT OT consulted Started on Coreg  6.25 twice daily She was started on IV diltiazem  Follow echo pending Per Daughter Amiodarone  was stop by cardiology, some time ago Orthostatic Positive: will order TED hose, will also change hydralazine  from TID to BID>   Acute on chronic Diastolic Dysfunction: Per daughter patient has been retaining more fluids.  BNP elevated.  She is on lasix  3 times week or as needed.  On Spironolactone  Tree times a week.  Start IV lasix  daily while I the hospital/   Ground-level fall: -PT , OT consulted.    Right posterior scalp laceration -local care.   Hypokalemia, hypomagnesemia -Replaced.   Diabetes type 2: -SSI -Will resume Semglee  10 units daily.   Pyuria; Urine culture no growth to date  Estimated body mass index is 28.65 kg/m as calculated from the following:   Height as of this encounter: 5\' 4"  (1.626 m).   Weight as of this encounter: 75.7 kg.   DVT prophylaxis: Eliquis .  Code Status: Full code Family Communication: daughter over phone Disposition Plan:  Status is: Inpatient Remains  inpatient appropriate because: management of A Fib    Consultants:  None  Procedures:  ECHo pending  Antimicrobials:    Subjective: She is alert, denies chest pain. Doesn't remember events that brought her to hospital.   Objective: Vitals:   06/18/23 2348 06/19/23 0442 06/19/23 0526 06/19/23 0639  BP: (!) 154/80 (!) 189/84 (!) 187/87 (!) 174/77  Pulse: (!) 111 74 77 72  Resp: 20 20 (!) 32   Temp: 98.3 F (36.8 C) 97.6 F (36.4 C)    TempSrc: Oral Oral    SpO2: 91% 94% 93% 93%  Weight:      Height:        Intake/Output Summary (Last 24 hours) at 06/19/2023 0726 Last data filed at 06/19/2023 0444 Gross per 24 hour  Intake 443.81 ml  Output 300 ml  Net 143.81 ml   Filed Weights   06/18/23 1336  Weight: 75.7 kg    Examination:  General exam: Appears calm and comfortable  Respiratory system: Clear to auscultation. Respiratory effort normal. Cardiovascular system: S1 & S2 heard, RRR. No JVD, murmurs, rubs, gallops or clicks. No pedal edema. Gastrointestinal system: Abdomen is nondistended, soft and nontender. No organomegaly or masses felt. Normal bowel sounds heard. Central nervous system: Alert and oriented. No focal neurological deficits. Extremities: Symmetric 5 x 5 power.    Data Reviewed: I have personally reviewed following labs and imaging studies  CBC: Recent Labs  Lab 06/18/23 0415  WBC 12.0*  NEUTROABS 5.6  HGB 11.3*  HCT 36.7  MCV 79.3*  PLT 202   Basic Metabolic Panel: Recent Labs  Lab 06/18/23 0415  NA 142  K 3.3*  CL 103  CO2 25  GLUCOSE 162*  BUN 15  CREATININE 0.95  CALCIUM 9.0  MG 1.6*   GFR: Estimated Creatinine Clearance: 40.8 mL/min (by C-G formula based on SCr of 0.95 mg/dL). Liver Function Tests: Recent Labs  Lab 06/18/23 0415  AST 23  ALT 15  ALKPHOS 61  BILITOT 0.6  PROT 6.6  ALBUMIN 3.6   No results for input(s): "LIPASE", "AMYLASE" in the last 168 hours. No results for input(s): "AMMONIA" in the last 168  hours. Coagulation Profile: No results for input(s): "INR", "PROTIME" in the last 168 hours. Cardiac Enzymes: No results for input(s): "CKTOTAL", "CKMB", "CKMBINDEX", "TROPONINI" in the last 168 hours. BNP (last 3 results) No results for input(s): "PROBNP" in the last 8760 hours. HbA1C: No results for input(s): "HGBA1C" in the last 72 hours. CBG: Recent Labs  Lab 06/18/23 1158 06/18/23 1330 06/18/23 1630 06/18/23 2102 06/19/23 0616  GLUCAP 140* 132* 122* 141* 166*   Lipid Profile: No results for input(s): "CHOL", "HDL", "LDLCALC", "TRIG", "CHOLHDL", "LDLDIRECT" in the last 72 hours. Thyroid  Function Tests: No results for input(s): "TSH", "T4TOTAL", "FREET4", "T3FREE", "THYROIDAB" in the last 72 hours. Anemia Panel: No results for input(s): "VITAMINB12", "FOLATE", "FERRITIN", "TIBC", "IRON", "RETICCTPCT" in the last 72 hours. Sepsis Labs: No results for input(s): "PROCALCITON", "LATICACIDVEN" in the last 168 hours.  No results found for this or any previous visit (from the past 240 hours).       Radiology Studies: CT Angio Chest Pulmonary Embolism (PE) W or WO Contrast Result Date: 06/18/2023 CLINICAL DATA:  Recent fall with chest pain, initial encounter EXAM: CT ANGIOGRAPHY CHEST WITH CONTRAST TECHNIQUE: Multidetector CT imaging of the chest was performed using the standard protocol during bolus administration of intravenous contrast. Multiplanar CT image reconstructions and MIPs were obtained to evaluate the vascular anatomy. RADIATION DOSE REDUCTION: This exam was performed according to the departmental dose-optimization program which includes automated exposure control, adjustment of the mA and/or kV according to patient size and/or use of iterative reconstruction technique. CONTRAST:  75mL OMNIPAQUE  IOHEXOL  350 MG/ML SOLN COMPARISON:  Chest x-ray from earlier in the same day, CT from 05/19/2023 FINDINGS: Cardiovascular: Atherosclerotic calcifications of the thoracic aorta are  noted. The ascending aorta measures 4.5 cm in greatest dimension. No dissection is seen. The pulmonary artery shows a normal branching pattern bilaterally. No pulmonary embolus is seen. Heart is mildly enlarged in size. Mild coronary calcifications are noted. Mediastinum/Nodes: Thoracic inlet is within normal limits. No hilar or mediastinal adenopathy is noted. The esophagus as visualized is within normal limits. Lungs/Pleura: Mild emphysematous changes are noted in the lungs bilaterally. No focal infiltrate or effusion is seen. No parenchymal nodules are noted. Upper Abdomen: No acute abnormality. Musculoskeletal: Degenerative changes of the thoracic spine are seen. No acute bony abnormality is noted. Review of the MIP images confirms the above findings. IMPRESSION: No evidence of pulmonary embolism. Dilatation of the ascending aorta to 4.5 cm. Ascending thoracic aortic aneurysm. Recommend semi-annual imaging followup by CTA or MRA and referral to cardiothoracic surgery if not already obtained. This recommendation follows 2010 ACCF/AHA/AATS/ACR/ASA/SCA/SCAI/SIR/STS/SVM Guidelines for the Diagnosis and Management of Patients With Thoracic Aortic Disease. Circulation. 2010; 121: Z610-R604. Aortic aneurysm NOS (ICD10-I71.9) No other focal abnormality is noted. Aortic Atherosclerosis (ICD10-I70.0) and Emphysema (ICD10-J43.9). Electronically Signed   By: Violeta Grey M.D.   On: 06/18/2023 23:48   DG Chest Portable 1 View Result Date: 06/18/2023 CLINICAL DATA:  Syncopal  episode. EXAM: PORTABLE CHEST 1 VIEW COMPARISON:  Portable chest 05/19/2023. FINDINGS: The lungs are clear with mild chronic elevation of the right diaphragm. The inferior left CP sulcus was excluded from the exam. The heart is enlarged but unchanged. No vascular congestion is seen. Stable mediastinum with aortic tortuosity and calcification. There is a tangle of overlying monitor wiring. No new osseous findings. Osteopenia. IMPRESSION: No evidence of  acute chest disease. Stable cardiomegaly. Aortic atherosclerosis. Electronically Signed   By: Denman Fischer M.D.   On: 06/18/2023 07:24   CT Head Wo Contrast Result Date: 06/18/2023 CLINICAL DATA:  Marvell Slider and hit back of head and neck trauma. EXAM: CT HEAD WITHOUT CONTRAST CT CERVICAL SPINE WITHOUT CONTRAST TECHNIQUE: Multidetector CT imaging of the head and cervical spine was performed following the standard protocol without intravenous contrast. Multiplanar CT image reconstructions of the cervical spine were also generated. RADIATION DOSE REDUCTION: This exam was performed according to the departmental dose-optimization program which includes automated exposure control, adjustment of the mA and/or kV according to patient size and/or use of iterative reconstruction technique. COMPARISON:  Head CT 06/30/2021, CTA and neck and reformats 01/16/2018. FINDINGS: CT HEAD FINDINGS Brain: No hemorrhage, mass effect or acute infarct is seen. There are dystrophic calcifications in the falx. There is mild atrophy, small-vessel disease and atrophic ventriculomegaly, and a chronic linear infarct in the inferior left cerebellar hemisphere. There is no midline shift.  The basal cisterns are clear Vascular: There is spray artifact from aneurysm coils again noted in the right cavernous sinus region and there appears to have been a prior attempted stenting of the right petrous ICA. No hyperdense central vessel is seen through the metal artifacts. There are carotid calcifications. Skull: Negative for fractures or focal lesions. Hyperostosis frontalis interna. Large dorsal right parieto-occipital scalp hematoma. Sinuses/Orbits: No acute findings. S shaped nasal septum. Old lens replacements. Other: None. CT CERVICAL SPINE FINDINGS Alignment: The patient's head is turned to the right but there is no C1-2 offset. There is no widening of the anterior atlanto dental joint. There is chronic minimal degenerative anterolisthesis at C3-4,  C4-5 and C7-T1, seen previously. Mild reversed lordosis. There is no traumatic or new listhesis. Skull base and vertebrae: No acute fracture is evident. There is generalized osteopenia. No primary bone lesion or focal pathologic process. Soft tissues and spinal canal: No prevertebral fluid or swelling. No visible canal hematoma. There is mild calcification in the proximal left cervical ICA. No thyroid  or laryngeal mass. Disc levels: there is disc space loss c3-4 through c6-7 with bidirectional endplate osteophytes, normal disc heights c2-3 and c7-t1. There are mild posterior disc osteophyte complexes from C3-4 through C6-7, slightly deforming the ventral cord surface without frank compression. Moderate facet hypertrophy is seen along with uncinate spurring. Acquired foraminal stenosis is moderate to severe on the right and mild on the left at C3-4, bilaterally mild C4-5, moderate on the left and mild on the right at C5-6, mild on the right at C6-7. Upper chest: There is bilateral apical scarring change. Otherwise negative. Other: None. IMPRESSION: 1. No acute intracranial CT findings or depressed skull fractures. 2. Large dorsal right parieto-occipital scalp hematoma. 3. Atrophy, small-vessel disease and chronic linear left cerebral infarct 4. 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 5. Osteopenia and degenerative change without evidence of cervical fractures. 6. Reversed lordosis and minimal degenerative listhesis. 7. Carotid calcifications. Electronically Signed   By: Aleck Hurdle.D.  On: 06/18/2023 05:36   CT Cervical Spine Wo Contrast Result Date: 06/18/2023 CLINICAL DATA:  Marvell Slider and hit back of head and neck trauma. EXAM: CT HEAD WITHOUT CONTRAST CT CERVICAL SPINE WITHOUT CONTRAST TECHNIQUE: Multidetector CT imaging of the head and cervical spine was performed following the standard protocol without intravenous contrast. Multiplanar CT  image reconstructions of the cervical spine were also generated. RADIATION DOSE REDUCTION: This exam was performed according to the departmental dose-optimization program which includes automated exposure control, adjustment of the mA and/or kV according to patient size and/or use of iterative reconstruction technique. COMPARISON:  Head CT 06/30/2021, CTA and neck and reformats 01/16/2018. FINDINGS: CT HEAD FINDINGS Brain: No hemorrhage, mass effect or acute infarct is seen. There are dystrophic calcifications in the falx. There is mild atrophy, small-vessel disease and atrophic ventriculomegaly, and a chronic linear infarct in the inferior left cerebellar hemisphere. There is no midline shift.  The basal cisterns are clear Vascular: There is spray artifact from aneurysm coils again noted in the right cavernous sinus region and there appears to have been a prior attempted stenting of the right petrous ICA. No hyperdense central vessel is seen through the metal artifacts. There are carotid calcifications. Skull: Negative for fractures or focal lesions. Hyperostosis frontalis interna. Large dorsal right parieto-occipital scalp hematoma. Sinuses/Orbits: No acute findings. S shaped nasal septum. Old lens replacements. Other: None. CT CERVICAL SPINE FINDINGS Alignment: The patient's head is turned to the right but there is no C1-2 offset. There is no widening of the anterior atlanto dental joint. There is chronic minimal degenerative anterolisthesis at C3-4, C4-5 and C7-T1, seen previously. Mild reversed lordosis. There is no traumatic or new listhesis. Skull base and vertebrae: No acute fracture is evident. There is generalized osteopenia. No primary bone lesion or focal pathologic process. Soft tissues and spinal canal: No prevertebral fluid or swelling. No visible canal hematoma. There is mild calcification in the proximal left cervical ICA. No thyroid  or laryngeal mass. Disc levels: there is disc space loss c3-4  through c6-7 with bidirectional endplate osteophytes, normal disc heights c2-3 and c7-t1. There are mild posterior disc osteophyte complexes from C3-4 through C6-7, slightly deforming the ventral cord surface without frank compression. Moderate facet hypertrophy is seen along with uncinate spurring. Acquired foraminal stenosis is moderate to severe on the right and mild on the left at C3-4, bilaterally mild C4-5, moderate on the left and mild on the right at C5-6, mild on the right at C6-7. Upper chest: There is bilateral apical scarring change. Otherwise negative. Other: None. IMPRESSION: 1. No acute intracranial CT findings or depressed skull fractures. 2. Large dorsal right parieto-occipital scalp hematoma. 3. Atrophy, small-vessel disease and chronic linear left cerebral infarct 4. 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 5. Osteopenia and degenerative change without evidence of cervical fractures. 6. Reversed lordosis and minimal degenerative listhesis. 7. Carotid calcifications. Electronically Signed   By: Denman Fischer M.D.   On: 06/18/2023 05:36        Scheduled Meds:  apixaban   5 mg Oral BID   carvedilol   12.5 mg Oral BID WC   insulin  aspart  0-15 Units Subcutaneous TID WC   pantoprazole   40 mg Oral Q supper   Continuous Infusions:   LOS: 1 day    Time spent: 35 minutes    Zyler Hyson A Monte Zinni, MD [facility]   If 7PM-7AM, please contact night-coverage [email or web address]  06/19/2023, 7:26 AM

## 2023-06-19 NOTE — Progress Notes (Signed)
 PT Cancellation Note  Patient Details Name: [name] MRN: [id number] DOB: 12/09/1932   Cancelled Treatment:    Reason Eval/Treat Not Completed: Medical issues which prohibited therapy  Patient reporting nausea and just finished eating. Will return later today (she prefers 3:30!).    Gayle Kava, PT Acute Rehabilitation Services  Office [phone]   Guilford Leep 06/19/2023, 10:44 AM

## 2023-06-19 NOTE — Evaluation (Signed)
 Physical Therapy Evaluation Patient Details Name: [name] MRN: [id number] DOB: February 11, 1932 Today's Date: 06/19/2023  History of Present Illness  89 y.o. female admitted 06/18/23 due to unwitnessed fall, posterior scalp laceration, and +afib with RVR. Pt with no recall of fall up through waking up in the hospital. Does not recall feeling dizzy prior.  PMH includes DM2, CAD, HTN, AAA, PVD, TIA, cerebral aneurysm s/p coiling, IBS, duodenitis, anxiety, afib.  Clinical Impression   Pt admitted secondary to problem above with deficits below. PTA patient was residing in ALF and using RW for ambulation. Pt currently required min assist for bed mobility (reports she is normally independent), CGA for transfers and gait with RW. Anticipate patient will benefit from PT to address problems listed below. Will continue to follow acutely to maximize functional mobility, independence, and safety. Patient could benefit from HHPT at her ALF (resuming PT as she reports currently receiving PT services).          If plan is discharge home, recommend the following: A little help with walking and/or transfers;A little help with bathing/dressing/bathroom;Direct supervision/assist for medications management;Direct supervision/assist for financial management;Assist for transportation;Help with stairs or ramp for entrance   Can travel by private vehicle        Equipment Recommendations None recommended by PT  Recommendations for Other Services       Functional Status Assessment Patient has had a recent decline in their functional status and demonstrates the ability to make significant improvements in function in a reasonable and predictable amount of time.     Precautions / Restrictions Precautions Precautions: Fall;Other (comment) Precaution/Restrictions Comments: +orthostasis      Mobility  Bed Mobility Overal bed mobility: Needs Assistance Bed Mobility: Supine to Sit, Sit to Supine     Supine to  sit: Min assist, HOB elevated Sit to supine: Min assist   General bed mobility comments: assist to raise torso; to raise legs onto bed    Transfers Overall transfer level: Needs assistance Equipment used: Rolling walker (2 wheels) Transfers: Sit to/from Stand Sit to Stand: Contact guard assist           General transfer comment: cues for hand placement    Ambulation/Gait Ambulation/Gait assistance: Contact guard assist Gait Distance (Feet): 40 Feet Assistive device: Rolling walker (2 wheels) Gait Pattern/deviations: Step-through pattern, Decreased stride length, Trunk flexed   Gait velocity interpretation: 1.31 - 2.62 ft/sec, indicative of limited community ambulator   General Gait Details: remained in room due to significant drop in BP with nursing with orthostatics with pt reporting feeling "shaky"  Stairs            Wheelchair Mobility     Tilt Bed    Modified Rankin (Stroke Patients Only)       Balance Overall balance assessment: Mild deficits observed, not formally tested                                           Pertinent Vitals/Pain Pain Assessment Pain Assessment: No/denies pain    Home Living Family/patient expects to be discharged to:: Assisted living                 Home Equipment: Agricultural consultant (2 wheels);Cane - single point;Wheelchair - manual;Shower seat      Prior Function Prior Level of Function : Needs assist  Mobility Comments: modified independent with RW (sometimes cane) ADLs Comments: has supervision when in the shower (but she does it herself!)     Extremity/Trunk Assessment   Upper Extremity Assessment Upper Extremity Assessment: Generalized weakness    Lower Extremity Assessment Lower Extremity Assessment: Generalized weakness    Cervical / Trunk Assessment Cervical / Trunk Assessment: Normal  Communication   Communication Communication: Impaired Factors Affecting  Communication: Hearing impaired    Cognition Arousal: Alert Behavior During Therapy: Anxious                           PT - Cognition Comments: initially stating she lives at home and later stated [name] Following commands: Intact       Cueing Cueing Techniques: Verbal cues, Gestural cues     General Comments General comments (skin integrity, edema, etc.): pt initially anxious re: participating with PT stating she has a PT at the facility    Exercises     Assessment/Plan    PT Assessment Patient needs continued PT services  PT Problem List Decreased strength;Decreased balance;Decreased mobility;Decreased cognition;Decreased knowledge of use of DME;Decreased safety awareness;Cardiopulmonary status limiting activity       PT Treatment Interventions DME instruction;Gait training;Functional mobility training;Therapeutic activities;Therapeutic exercise;Balance training;Cognitive remediation;Patient/family education    PT Goals (Current goals can be found in the Care Plan section)  Acute Rehab PT Goals Patient Stated Goal: return to ALF and her PT, [name] PT Goal Formulation: With patient Time For Goal Achievement: 07/03/23 Potential to Achieve Goals: Good    Frequency Min 2X/week     Co-evaluation               AM-PAC PT "6 Clicks" Mobility  Outcome Measure Help needed turning from your back to your side while in a flat bed without using bedrails?: None Help needed moving from lying on your back to sitting on the side of a flat bed without using bedrails?: A Little Help needed moving to and from a bed to a chair (including a wheelchair)?: A Little Help needed standing up from a chair using your arms (e.g., wheelchair or bedside chair)?: A Little Help needed to walk in hospital room?: A Little Help needed climbing 3-5 steps with a railing? : A Lot 6 Click Score: 18    End of Session Equipment Utilized During Treatment: Gait belt Activity  Tolerance: Patient limited by fatigue Patient left: in bed;with call bell/phone within reach;with bed alarm set Nurse Communication: Mobility status;Other (comment) (BP came up with ambulation) PT Visit Diagnosis: Difficulty in walking, not elsewhere classified (R26.2)    Time: 2130-8657 PT Time Calculation (min) (ACUTE ONLY): 21 min   Charges:   PT Evaluation $PT Eval Low Complexity: 1 Low   PT General Charges $$ ACUTE PT VISIT: 1 Visit          Gayle Kava, PT Acute Rehabilitation Services  Office [phone]   [place] Leep 06/19/2023, 3:45 PM

## 2023-06-19 NOTE — Progress Notes (Signed)
 Pt HR Afib btn 100-142 unsustained. Hailey Lash, DO notified.  Orders received and carried out.[name]  0442 Pt HR is 72, sinus rhythm  0530 Pt Bp 187/87. C.Hall, DO notified.    Orders received and carried out.  0981 Pt Bp 174/77.

## 2023-06-19 NOTE — Progress Notes (Signed)
 While patient is sleeping o2 sats drop in mid to upper 80's Applied o2 2 liters [place]. while sleeping.

## 2023-06-19 NOTE — TOC Initial Note (Signed)
 Transition of Care [facility]) - Initial/Assessment Note    Patient Details  Name: [name] MRN: [id number] Date of Birth: 25-Nov-1932  Transition of Care [name]) CM/SW Contact:    Valery Gaucher, LCSW Phone Number: 06/19/2023, 4:54 PM  Clinical Narrative:          CSW met with at bedside. CSW introduced self and explained role. Patient confirmed she is form [name] and the plan is for her to return once stable. All questions answered.   TOC will continue to follow and assist with discharge planning.  Liddie Reel, MSW, LCSW Clinical Social Worker           Expected Discharge Plan: Home w Home Health Services Barriers to Discharge: Continued Medical Work up   Patient Goals and CMS Choice            Expected Discharge Plan and Services                                              Prior Living Arrangements/Services                       Activities of Daily Living   ADL Screening (condition at time of admission) Independently performs ADLs?: No Does the patient have a NEW difficulty with bathing/dressing/toileting/self-feeding that is expected to last >3 days?: Yes (Initiates electronic notice to provider for possible OT consult) Does the patient have a NEW difficulty with getting in/out of bed, walking, or climbing stairs that is expected to last >3 days?: Yes (Initiates electronic notice to provider for possible PT consult) Does the patient have a NEW difficulty with communication that is expected to last >3 days?: Yes (Initiates electronic notice to provider for possible SLP consult) Is the patient deaf or have difficulty hearing?: Yes Does the patient have difficulty seeing, even when wearing glasses/contacts?: Yes Does the patient have difficulty concentrating, remembering, or making decisions?: Yes  Permission Sought/Granted                  Emotional Assessment              Admission diagnosis:  Atrial fibrillation with  rapid ventricular response (HCC) [I48.91] Hematoma of scalp, initial encounter [S00.03XA] Fall, initial encounter [W19.XXXA] Altered mental status, unspecified altered mental status type [R41.82] Atrial fibrillation, unspecified type [name]) [I48.91] Patient Active Problem List   Diagnosis Date Noted   Atrial fibrillation with rapid ventricular response (HCC) 06/18/2023   Atrial fibrillation with RVR (HCC) 05/19/2023   Hypomagnesemia 05/19/2023   Gastroenteritis 05/19/2023   Coronary artery calcification seen on CT scan 09/26/2022   Mitral regurgitation 09/26/2022   Acute hypoxemic respiratory failure (HCC) 02/03/2022   Hypertensive urgency 02/03/2022   (HFpEF) heart failure with preserved ejection fraction (HCC) 02/03/2022   Nausea and vomiting 02/03/2022   Elevated troponin 01/02/2022   Hyperlipidemia 01/02/2022   Old MI (myocardial infarction) 01/02/2022   Lung nodule 07/12/2021   Weakness 06/30/2021   Ankle edema, bilateral 01/21/2021   Sprain of left rotator cuff capsule 01/21/2021   Cervical radiculopathy due to degenerative joint disease of spine 01/21/2021   Left wrist pain 08/06/2019   Bicuspid aortic valve    Constipation 11/14/2017   Rectal bleeding 11/14/2017   Carotid stenosis    Heart palpitations 03/23/2017   Ascending aortic aneurysm (HCC) 12/07/2015   SOB (  shortness of breath) 12/07/2015   Orthostatic hypotension 04/02/2014   Hematochezia 04/02/2014   Chest pain    Near syncope 04/01/2014   DM (diabetes mellitus) (HCC) 04/01/2014   Diverticulosis 04/01/2014   Unruptured cerebral aneurysm 01/08/2014   Multifactorial gait disorder 01/08/2014   Aneurysm, cerebral, nonruptured 08/07/2012   Amaurosis fugax 08/07/2012   Hereditary and idiopathic peripheral neuropathy 08/07/2012   Abnormal x-ray of lumbar spine 08/29/2010   Low back pain 08/24/2010   Anxiety state 03/26/2010   Essential hypertension 03/26/2010   Bilateral lower abdominal pain 03/26/2010   GERD  12/22/2008   Diarrhea 12/22/2008   IRON DEFICIENCY 05/22/2008   Irritable bowel syndrome 04/07/2008   History of colonic polyps 04/07/2008   PCP:  [facility]da [facility] Pharmacy:   [facility] [phone] - [place], [place] - [address] AT [address] [facility] [address] [place] [address] Phone: [phone] Fax: [phone]  [facility] [address] [place] [address] Phone: [phone] Fax: [phone]  [facility] - [place], [place] - [id number] [address] [place] [address] Phone: [phone] Fax: [phone]     Social Drivers of Health (SDOH) Social History: SDOH Screenings   Food Insecurity: No Food Insecurity (06/18/2023)  Housing: Low Risk  (06/18/2023)  Transportation Needs: No Transportation Needs (06/18/2023)  Utilities: Not At Risk (06/18/2023)  Social Connections: Moderately Isolated (06/18/2023)  Tobacco Use: Low Risk  (06/18/2023)   SDOH Interventions:     Readmission Risk Interventions     No data to display

## 2023-06-20 DIAGNOSIS — I10 Essential (primary) hypertension: Secondary | ICD-10-CM

## 2023-06-20 DIAGNOSIS — W19XXXA Unspecified fall, initial encounter: Secondary | ICD-10-CM

## 2023-06-20 DIAGNOSIS — I4891 Unspecified atrial fibrillation: Secondary | ICD-10-CM | POA: Diagnosis not present

## 2023-06-20 DIAGNOSIS — I5033 Acute on chronic diastolic (congestive) heart failure: Secondary | ICD-10-CM

## 2023-06-20 LAB — CBC
HCT: 33.6 % — ABNORMAL LOW (ref 36.0–46.0)
Hemoglobin: 10.4 g/dL — ABNORMAL LOW (ref 12.0–15.0)
MCH: 24.2 pg — ABNORMAL LOW (ref 26.0–34.0)
MCHC: 31 g/dL (ref 30.0–36.0)
MCV: 78.1 fL — ABNORMAL LOW (ref 80.0–100.0)
Platelets: 209 10*3/uL (ref 150–400)
RBC: 4.3 MIL/uL (ref 3.87–5.11)
RDW: 16.2 % — ABNORMAL HIGH (ref 11.5–15.5)
WBC: 10 10*3/uL (ref 4.0–10.5)
nRBC: 0 % (ref 0.0–0.2)

## 2023-06-20 LAB — GLUCOSE, CAPILLARY
Glucose-Capillary: 140 mg/dL — ABNORMAL HIGH (ref 70–99)
Glucose-Capillary: 231 mg/dL — ABNORMAL HIGH (ref 70–99)
Glucose-Capillary: 155 mg/dL — ABNORMAL HIGH (ref 70–99)
Glucose-Capillary: 140 mg/dL — ABNORMAL HIGH (ref 70–99)

## 2023-06-20 LAB — BASIC METABOLIC PANEL WITH GFR
Anion gap: 8 (ref 5–15)
BUN: 16 mg/dL (ref 8–23)
CO2: 29 mmol/L (ref 22–32)
Calcium: 8.9 mg/dL (ref 8.9–10.3)
Chloride: 102 mmol/L (ref 98–111)
Creatinine, Ser: 1.09 mg/dL — ABNORMAL HIGH (ref 0.44–1.00)
GFR, Estimated: 49 mL/min — ABNORMAL LOW (ref >60)
Glucose, Bld: 138 mg/dL — ABNORMAL HIGH (ref 70–99)
Potassium: 3.9 mmol/L (ref 3.5–5.1)
Sodium: 139 mmol/L (ref 135–145)

## 2023-06-20 MED ORDER — HYDRALAZINE HCL 20 MG/ML IJ SOLN
10.0000 mg | INTRAMUSCULAR | Status: DC | PRN
  Administered 2023-06-20 – 2023-06-22 (×2): 10 mg via INTRAVENOUS
  Filled 2023-06-20 (×2): qty 1

## 2023-06-20 MED ORDER — CARVEDILOL 6.25 MG PO TABS
6.2500 mg | ORAL_TABLET | Freq: Two times a day (BID) | ORAL | Status: DC
  Administered 2023-06-20: 6.25 mg via ORAL
  Filled 2023-06-20: qty 1

## 2023-06-20 MED ORDER — AMIODARONE HCL 200 MG PO TABS
200.0000 mg | ORAL_TABLET | Freq: Two times a day (BID) | ORAL | Status: DC
  Administered 2023-06-20 – 2023-06-23 (×7): 200 mg via ORAL
  Filled 2023-06-20 (×7): qty 1

## 2023-06-20 MED ORDER — DILTIAZEM HCL 25 MG/5ML IV SOLN
20.0000 mg | Freq: Once | INTRAVENOUS | Status: AC
  Administered 2023-06-20: 20 mg via INTRAVENOUS
  Filled 2023-06-20: qty 5

## 2023-06-20 MED ORDER — METOPROLOL TARTRATE 25 MG PO TABS
25.0000 mg | ORAL_TABLET | Freq: Two times a day (BID) | ORAL | Status: DC
  Administered 2023-06-20 – 2023-06-23 (×7): 25 mg via ORAL
  Filled 2023-06-20 (×7): qty 1

## 2023-06-20 MED ORDER — FUROSEMIDE 10 MG/ML IJ SOLN
20.0000 mg | Freq: Every day | INTRAMUSCULAR | Status: DC
  Administered 2023-06-20 – 2023-06-21 (×2): 20 mg via INTRAVENOUS
  Filled 2023-06-20 (×2): qty 2

## 2023-06-20 MED ORDER — GERHARDT'S BUTT CREAM
TOPICAL_CREAM | Freq: Two times a day (BID) | CUTANEOUS | Status: DC
  Filled 2023-06-20: qty 60

## 2023-06-20 NOTE — Progress Notes (Signed)
 B.P. ranging from 203/82 to 190/74 Heart rate S.R. 60's-70's .Dr Yehuda Helms text paged see new orders for Hydralazine  10 mg I.V. for SBP greater than 180 q 4 hrs prn

## 2023-06-20 NOTE — Care Management Important Message (Signed)
 Important Message  Patient Details  Name: [name] MRN: [id number] Date of Birth: August 10, 1932   Important Message Given:  Yes - Medicare IM     Felix Host 06/20/2023, 5:03 PM

## 2023-06-20 NOTE — TOC Progression Note (Signed)
 Transition of Care [facility]) - Progression Note    Patient Details  Name: [name] MRN: [id number] Date of Birth: Jun 21, 1932  Transition of Care [facility]) CM/SW Contact  Valery Gaucher, [place] Phone Number: 06/20/2023, 11:15 AM  Clinical Narrative:     [facility] visit with patient- CSW informed to contact her at [phone], once patient is stable for discharge.  Liddie Reel, MSW, LCSW Clinical Social Worker    Expected Discharge Plan: Home w Home Health Services Barriers to Discharge: Continued Medical Work up  Expected Discharge Plan and Services                                               Social Determinants of Health (SDOH) Interventions SDOH Screenings   Food Insecurity: No Food Insecurity (06/18/2023)  Housing: Low Risk  (06/18/2023)  Transportation Needs: No Transportation Needs (06/18/2023)  Utilities: Not At Risk (06/18/2023)  Social Connections: Moderately Isolated (06/18/2023)  Tobacco Use: Low Risk  (06/18/2023)    Readmission Risk Interventions     No data to display

## 2023-06-20 NOTE — Progress Notes (Addendum)
 PROGRESS NOTE    [name]  ZOX:[id number] DOB: 1932-08-30 DOA: 06/18/2023 PCP: Gerda Knows, Pllc   Brief Narrative: 89 year old woman with PMH of CAD, AAA, status post coiling of cerebral artery aneurysm, A-fib, hypertension, hyperlipidemia, diabetes who presented with unwitnessed fall, was found to be in A-fib with RVR.  Patient is a poor historian.  She remember going to the restroom and then waking up in the hospital.    Assessment & Plan:   Principal Problem:   Atrial fibrillation with rapid ventricular response (HCC)   A-fib with RVR Patient presented with unwitnessed fall found to be in A-fib with RVR She missed a.m. dose of metoprolol  prior to admission. Per Daughter Amiodarone  was stopped by cardiology some time ago CT head: No acute intracranial abnormalities. Large dorsal right parieto-occipital scalp hematoma.  Chest x-ray, urine culture PT OT consulted Started on Coreg  6.25 twice daily She was started on IV diltiazem , which was discontinued on 5/18. TTE reveals EF 65 to 70%, mild LVH, left atrial dilatation. Patient went back into RVR on 06/20/2023. - Diltiazem  push ordered. - Continue Coreg . -Consult cardiology.  Presumed syncope: Orthostatic Positive - TED hose ordered,  - Changed hydralazine  from TID to BID  Acute on chronic Diastolic Dysfunction: Per daughter patient has been retaining more fluids.  BNP elevated.  She is on lasix  3 times week or as needed.  On Spironolactone  Tree times a week.  - Started on IV lasix . Dose reduced to 20 mg daily due to increase in creatinine.   Ground-level fall: -PT/OT consulted and HHPT recommended.   Right posterior scalp laceration Due to fall..  -local care.   Hypokalemia, hypomagnesemia - Replete as needed.   Diabetes type 2, controlled Last A1c (05/19/2023) was 6.6. -SSI -Semglee  10 units daily.   Pyuria; Urine culture no growth to date  Estimated body mass index is 28.65 kg/m as  calculated from the following:   Height as of this encounter: 5\' 4"  (1.626 m).   Weight as of this encounter: 75.7 kg.   DVT prophylaxis: Eliquis .  Code Status: Full code Family Communication: n/a Disposition Plan:  Status is: Inpatient Remains inpatient appropriate because: management of A Fib    Consultants:  None   Subjective: Patient complains of nausea.  She denies lightheadedness.  She admits to feeling weak. HR 130s to 150s this morning. Diltiazem  push ordered. Cardiology consulted.   Objective: Vitals:   06/20/23 1058 06/20/23 1100 06/20/23 1104 06/20/23 1134  BP:   (!) 88/46 (!) 106/56  Pulse: 94 90 95 87  Resp: (!) 28 (!) 27 20 (!) 23  Temp:      TempSrc:      SpO2: 98% 98% 98% 98%  Weight:      Height:        Intake/Output Summary (Last 24 hours) at 06/20/2023 1217 Last data filed at 06/20/2023 1100 Gross per 24 hour  Intake 240 ml  Output 1650 ml  Net -1410 ml   Filed Weights   06/18/23 1336  Weight: 75.7 kg   Physical Exam   General: Alert, oriented X3  Eyes: Pupils equal, reactive  Oral cavity: moist mucous membranes  Head: Atraumatic, normocephalic  Neck: supple  Chest: clear to auscultation. No crackles, no wheezes  CVS: S1,S2 RRR. Tachycardia Abd: No distention, soft, non-tender. No masses palpable  Extr: No edema   MSK: No joint deformities or swelling  Neurological: Grossly intact.    Data Reviewed: I have personally reviewed following labs and  imaging studies  CBC: Recent Labs  Lab 06/18/23 0415 06/19/23 0902 06/20/23 0424  WBC 12.0* 9.7 10.0  NEUTROABS 5.6  --   --   HGB 11.3* 10.2* 10.4*  HCT 36.7 33.5* 33.6*  MCV 79.3* 79.2* 78.1*  PLT 202 202 209   Basic Metabolic Panel: Recent Labs  Lab 06/18/23 0415 06/19/23 0902 06/20/23 0424  NA 142 141 139  K 3.3* 4.9 3.9  CL 103 106 102  CO2 25 27 29   GLUCOSE 162* 120* 138*  BUN 15 13 16   CREATININE 0.95 0.99 1.09*  CALCIUM 9.0 8.9 8.9  MG 1.6* 2.1  --     GFR: Estimated Creatinine Clearance: 35.5 mL/min (A) (by C-G formula based on SCr of 1.09 mg/dL (H)). Liver Function Tests: Recent Labs  Lab 06/18/23 0415  AST 23  ALT 15  ALKPHOS 61  BILITOT 0.6  PROT 6.6  ALBUMIN 3.6   No results for input(s): "LIPASE", "AMYLASE" in the last 168 hours. No results for input(s): "AMMONIA" in the last 168 hours. Coagulation Profile: No results for input(s): "INR", "PROTIME" in the last 168 hours. Cardiac Enzymes: No results for input(s): "CKTOTAL", "CKMB", "CKMBINDEX", "TROPONINI" in the last 168 hours. BNP (last 3 results) No results for input(s): "PROBNP" in the last 8760 hours. HbA1C: No results for input(s): "HGBA1C" in the last 72 hours. CBG: Recent Labs  Lab 06/19/23 0616 06/19/23 1142 06/19/23 1647 06/19/23 2115 06/20/23 0610  GLUCAP 166* 207* 120* 141* 140*   Lipid Profile: No results for input(s): "CHOL", "HDL", "LDLCALC", "TRIG", "CHOLHDL", "LDLDIRECT" in the last 72 hours. Thyroid  Function Tests: No results for input(s): "TSH", "T4TOTAL", "FREET4", "T3FREE", "THYROIDAB" in the last 72 hours. Anemia Panel: No results for input(s): "VITAMINB12", "FOLATE", "FERRITIN", "TIBC", "IRON", "RETICCTPCT" in the last 72 hours. Sepsis Labs: No results for input(s): "PROCALCITON", "LATICACIDVEN" in the last 168 hours.  Recent Results (from the past 240 hours)  Urine Culture     Status: None   Collection Time: 06/18/23  7:35 AM   Specimen: Urine, Clean Catch  Result Value Ref Range Status   Specimen Description URINE, CLEAN CATCH  Final   Special Requests NONE  Final   Culture   Final    NO GROWTH Performed at [facility], [address] [place], [place] [address]    Report Status 06/19/2023 FINAL  Final         Radiology Studies: ECHOCARDIOGRAM COMPLETE Result Date: 06/19/2023    ECHOCARDIOGRAM REPORT   Patient Name:   [name] Date of Exam: 06/18/2023 Medical Rec #:  [id number]       Height:       64.0 in  Accession #:    [id number]      Weight:       166.9 lb Date of Birth:  10-03-1932       BSA:          1.812 m Patient Age:    89 years        BP:           120/57 mmHg Patient Gender: F               HR:           113 bpm. Exam Location:  Inpatient Procedure: 2D Echo (Both Spectral and Color Flow Doppler were utilized during            procedure). Indications:    syncope  History:  Patient has prior history of Echocardiogram examinations, most                 recent 01/03/2022. CAD and ascending aortic aneurysm,                 Arrythmias:Atrial Fibrillation; Risk Factors:Hypertension,                 Dyslipidemia and Diabetes.  Sonographer:    Dione Franks RDCS Referring Phys: [id number] Arne Langdon IMPRESSIONS  1. Left ventricular ejection fraction, by estimation, is 65 to 70%. The left ventricle has normal function. The left ventricle has no regional wall motion abnormalities. There is mild left ventricular hypertrophy. Left ventricular diastolic parameters are indeterminate.  2. Right ventricular systolic function is normal. The right ventricular size is mildly enlarged. There is mildly elevated pulmonary artery systolic pressure. The estimated right ventricular systolic pressure is 44.0 mmHg.  3. Left atrial size was moderately dilated.  4. Right atrial size was moderately dilated.  5. The mitral valve is degenerative. Mild mitral valve regurgitation. No evidence of mitral stenosis.  6. Tricuspid valve regurgitation is mild to moderate.  7. The aortic valve is abnormal. There is moderate calcification of the aortic valve. Aortic valve regurgitation is trivial. No aortic stenosis is present.  8. Aortic dilatation noted. There is borderline dilatation of the aortic root and of the ascending aorta, measuring 41 mm.  9. The inferior vena cava is normal in size with greater than 50% respiratory variability, suggesting right atrial pressure of 3 mmHg. 10. Cannot exclude a small PFO. FINDINGS  Left Ventricle:  Left ventricular ejection fraction, by estimation, is 65 to 70%. The left ventricle has normal function. The left ventricle has no regional wall motion abnormalities. Definity  contrast agent was given IV to delineate the left ventricular  endocardial borders. The left ventricular internal cavity size was normal in size. There is mild left ventricular hypertrophy. Left ventricular diastolic parameters are indeterminate. Right Ventricle: The right ventricular size is mildly enlarged. No increase in right ventricular wall thickness. Right ventricular systolic function is normal. There is mildly elevated pulmonary artery systolic pressure. The tricuspid regurgitant velocity is 3.20 m/s, and with an assumed right atrial pressure of 3 mmHg, the estimated right ventricular systolic pressure is 44.0 mmHg. Left Atrium: Left atrial size was moderately dilated. Right Atrium: Right atrial size was moderately dilated. Pericardium: Trivial pericardial effusion is present. Mitral Valve: The mitral valve is degenerative in appearance. Mild mitral valve regurgitation. No evidence of mitral valve stenosis. Tricuspid Valve: The tricuspid valve is grossly normal. Tricuspid valve regurgitation is mild to moderate. No evidence of tricuspid stenosis. Aortic Valve: The aortic valve is abnormal. There is moderate calcification of the aortic valve. Aortic valve regurgitation is trivial. No aortic stenosis is present. Pulmonic Valve: The pulmonic valve was normal in structure. Pulmonic valve regurgitation is trivial. No evidence of pulmonic stenosis. Aorta: Aortic dilatation noted. There is borderline dilatation of the aortic root and of the ascending aorta, measuring 41 mm. Venous: The inferior vena cava is normal in size with greater than 50% respiratory variability, suggesting right atrial pressure of 3 mmHg. IAS/Shunts: Cannot exclude a small PFO.  LEFT VENTRICLE PLAX 2D LVIDd:         4.30 cm LVIDs:         2.80 cm LV PW:         1.10 cm  LV IVS:        1.30 cm LVOT diam:  2.00 cm LV SV:         49 LV SV Index:   27 LVOT Area:     3.14 cm  RIGHT VENTRICLE          IVC RV Basal diam:  2.80 cm  IVC diam: 1.80 cm TAPSE (M-mode): 1.6 cm LEFT ATRIUM             Index        RIGHT ATRIUM           Index LA diam:        3.90 cm 2.15 cm/m   RA Area:     19.00 cm LA Vol (A2C):   77.5 ml 42.78 ml/m  RA Volume:   52.00 ml  28.71 ml/m LA Vol (A4C):   54.2 ml 29.92 ml/m LA Biplane Vol: 68.8 ml 37.98 ml/m  AORTIC VALVE LVOT Vmax:   100.00 cm/s LVOT Vmean:  67.000 cm/s LVOT VTI:    0.155 m  AORTA Ao Root diam: 4.10 cm Ao Asc diam:  4.00 cm TRICUSPID VALVE TR Peak grad:   41.0 mmHg TR Vmax:        320.00 cm/s  SHUNTS Systemic VTI:  0.16 m Systemic Diam: 2.00 cm Grady Lawman MD Electronically signed by Grady Lawman MD Signature Date/Time: 06/19/2023/10:03:48 PM    Final    CT Angio Chest Pulmonary Embolism (PE) W or WO Contrast Result Date: 06/18/2023 CLINICAL DATA:  Recent fall with chest pain, initial encounter EXAM: CT ANGIOGRAPHY CHEST WITH CONTRAST TECHNIQUE: Multidetector CT imaging of the chest was performed using the standard protocol during bolus administration of intravenous contrast. Multiplanar CT image reconstructions and MIPs were obtained to evaluate the vascular anatomy. RADIATION DOSE REDUCTION: This exam was performed according to the departmental dose-optimization program which includes automated exposure control, adjustment of the mA and/or kV according to patient size and/or use of iterative reconstruction technique. CONTRAST:  75mL OMNIPAQUE  IOHEXOL  350 MG/ML SOLN COMPARISON:  Chest x-ray from earlier in the same day, CT from 05/19/2023 FINDINGS: Cardiovascular: Atherosclerotic calcifications of the thoracic aorta are noted. The ascending aorta measures 4.5 cm in greatest dimension. No dissection is seen. The pulmonary artery shows a normal branching pattern bilaterally. No pulmonary embolus is seen. Heart is mildly enlarged in  size. Mild coronary calcifications are noted. Mediastinum/Nodes: Thoracic inlet is within normal limits. No hilar or mediastinal adenopathy is noted. The esophagus as visualized is within normal limits. Lungs/Pleura: Mild emphysematous changes are noted in the lungs bilaterally. No focal infiltrate or effusion is seen. No parenchymal nodules are noted. Upper Abdomen: No acute abnormality. Musculoskeletal: Degenerative changes of the thoracic spine are seen. No acute bony abnormality is noted. Review of the MIP images confirms the above findings. IMPRESSION: No evidence of pulmonary embolism. Dilatation of the ascending aorta to 4.5 cm. Ascending thoracic aortic aneurysm. Recommend semi-annual imaging followup by CTA or MRA and referral to cardiothoracic surgery if not already obtained. This recommendation follows 2010 ACCF/AHA/AATS/ACR/ASA/SCA/SCAI/SIR/STS/SVM Guidelines for the Diagnosis and Management of Patients With Thoracic Aortic Disease. Circulation. 2010; 121: U981-X914. Aortic aneurysm NOS (ICD10-I71.9) No other focal abnormality is noted. Aortic Atherosclerosis (ICD10-I70.0) and Emphysema (ICD10-J43.9). Electronically Signed   By: Violeta Grey M.D.   On: 06/18/2023 23:48        Scheduled Meds:  apixaban   5 mg Oral BID   carvedilol   12.5 mg Oral BID WC   furosemide   20 mg Intravenous Daily   hydrALAZINE   10 mg Oral BID  insulin  aspart  0-15 Units Subcutaneous TID WC   insulin  glargine-yfgn  10 Units Subcutaneous Daily   melatonin  3 mg Oral QHS   pantoprazole   40 mg Oral Q supper   Continuous Infusions:   LOS: 2 days    Time spent: 50 minutes    MDALA-GAUSI, Jilda Most, MD [facility]   If 7PM-7AM, please contact night-coverage [email or web address]  06/20/2023, 12:17 PM

## 2023-06-20 NOTE — Progress Notes (Signed)
 Notified MD this morning that patient is in A Fib RVR with rates 130-155. No new orders at this time. Patient stated she just woke up startled and was having a dream about her husband. Patient is slightly panic at this time. RN gave scheduled coreg  and ativan  for the anxiety.   MD notified again regarding no change in HR. Per MD, they will come see patient. No other orders at this time.

## 2023-06-20 NOTE — Progress Notes (Signed)
 [facility] night cross cover note:   I was notified by the patient's RN that the patient's elevated systolic blood pressures in the range of 190s to low 200s, with corresponding heart rates in the 60s to 70s.  No report of any associated new symptoms.  Patient with a history of hypertension, with current antihypertensive medications that include Coreg  as well as oral hydralazine .  I subsequently added order for as needed IV hydralazine  for systolic blood pressure greater than 180 mmHg.     Camelia Cavalier, DO Hospitalist

## 2023-06-20 NOTE — Plan of Care (Signed)

## 2023-06-20 NOTE — Consult Note (Addendum)
 Cardiology Consultation   Patient ID: [name] MRN: [id number]; DOB: 1932/08/01  Admit date: 06/18/2023 Date of Consult: 06/20/2023  PCP:  [name]   [facility] Providers Cardiologist:  Gaylyn Keas, MD     Patient Profile:   [name] is a 89 y.o. female with a hx of HFpEF, paroxsymal atrial fibrillation, coronary artery calcification, mitral regurgitation, DM, thoracic aortic aneurysm who is being seen 06/20/2023 for the evaluation of atrial fibrillation at the request of Dr. Melonie Square.  History of Present Illness:   [name] is an 89 yo female with PMH noted above. Has been followed by Dr. Micael Adas. Admitted 12/2021 with atrial fibrillation with elevated troponins that peaked at 3162 but she opted for conservative management/no ischemic work up. Admitted again 01/2022 with gastroenteritis which was complicated by recurrent atrial fibrillation and HF. Started on amiodarone  that admission.   Presented to the ED 02/2023 with recurrent atrial fibrillation and fatigue. Found to be in afib RVR. Converted to sinus rhythm while in the ED. Continued on amiodarone  200mg  daily, metoprolol  12.5mg  BID, Eliquis  5mg  BID. Was not volume overloaded at that time. Discharged from the ED with outpatient follow up.   Presented to the ED on 5/18 with unwitnessed fall from ALF. Reports she got up in the middle of the night, possibly to go to the bathroom. Doesn't remember much, says she woke up in the hospital. Brought in via EMS, noted to be confused on arrival in the ED  Labs on admission showed Na 142, K 3.3, Cr 0.95, BNP 309, WBC 12, Hgb 11.3. CT head/neck with large scalp hematoma, prior aneurysm coils. CT angio without PE, atherosclerotic calcifications of thoracic aorta. EKG initially showed sinus rhythm, but developed atrial fibrillation with RVR. Placed on IV Diltiazem  and converted to sinus rhythm. Admitted to IM for further management. Echo 5/18 with LVEF of  65-70%, no rWMA, mild LVH, normal RV, mildly elevated PA pressure, moderate biatrial enlargement, mild MR, moderate aortic calcification. Developed recurrent atrial fibrillation with RVR this morning around 8am, given IVP Diltiazem  but BP soft. Cardiology asked to consult.   In talking with patient, her husband passed away about 3 weeks ago. They were in the ALF together, married for 50 years. Uses a cane or walker to get around. Denies any symptoms of palpitations prior to admission.    Past Medical History:  Diagnosis Date   Abdominal pain, chronic, left lower quadrant    Acute torn meniscus of knee    Adenomatous colon polyp 1970   carcinoma in situ   Allergic rhinitis    Amaurosis fugax 08/07/2012   Anxiety    Ascending aortic aneurysm (HCC) 12/07/2015   44mm by chest CTA 08/2020   Benign essential tremor syndrome    Bicuspid aortic valve    no AS on 07/2019   Carotid stenosis    1039 bilateral by dopplers 03/2017.    colon ca dx'd 1970   surg only   Coronary artery calcification seen on CT scan 09/26/2022   - Cath in 2002 normal - Myoview in 2017 low risk - Admx in 12/2021 w AF w RVR and elevated hsT c/w NSTEMI - pt preferred conservative mgmt (no cath)    DDD (degenerative disc disease)    Diverticulitis    Diverticulosis    DM (diabetes mellitus) (HCC)    Duodenitis    peptic, with gastric heterotopia   Endometriosis    s/p hysterectomy   Fundic gland polyposis  of stomach    GERD (gastroesophageal reflux disease)    Heart murmur    Hiatal hernia 02/03/2005   History of cerebral aneurysm repair    s/p coiling   History of hemorrhoids    with bleeding   History of shingles    HLD (hyperlipidemia)    HTN (hypertension)    Hypercholesteremia    IBS (irritable bowel syndrome)    Iron deficiency    Lung nodule 07/12/2021   CT 05/2021: 3 mm right solid pulmonary nodule-no routine follow-up imaging recommended   Migraine    MVP (mitral valve prolapse)    Osteopenia     Peripheral neuropathy    Toe fracture, right    second toe   UTI (lower urinary tract infection)    Varicose vein     Past Surgical History:  Procedure Laterality Date   ABDOMINAL HYSTERECTOMY     APPENDECTOMY     CARPAL TUNNEL RELEASE  08/26/07   CATARACT EXTRACTION     CEREBRAL ANEURYSM REPAIR     COLON RESECTION     COLONOSCOPY  06/07/08   divertiulosis, internal hemorrhoids   COLONOSCOPY W/ BIOPSIES AND POLYPECTOMY  02/03/05   diverticulosis, 4 mm sessile polyps, internal and external hemorrhoids   CORONARY ANGIOPLASTY WITH STENT PLACEMENT     ESOPHAGOGASTRODUODENOSCOPY  02/03/05   hiatal hernia, 6 benign gastric polyps   FLEXIBLE SIGMOIDOSCOPY     HAND SURGERY Right    INTRAOCULAR LENS INSERTION     right hand decompressive fasciotomy  08/26/07   , dorsal and volar   TONSILLECTOMY      Inpatient Medications: Scheduled Meds:  apixaban   5 mg Oral BID   carvedilol   12.5 mg Oral BID WC   furosemide   20 mg Intravenous Daily   hydrALAZINE   10 mg Oral BID   insulin  aspart  0-15 Units Subcutaneous TID WC   insulin  glargine-yfgn  10 Units Subcutaneous Daily   melatonin  3 mg Oral QHS   pantoprazole   40 mg Oral Q supper   Continuous Infusions:  PRN Meds: acetaminophen , docusate sodium , hydrALAZINE , LORazepam , ondansetron  (ZOFRAN ) IV  Allergies:    Allergies  Allergen Reactions   Actos [Pioglitazone] Other (See Comments)    Chronic pedal edema   Amoxil  [Amoxicillin ] Other (See Comments)    Stomach upset   Avandia [Rosiglitazone] Other (See Comments)    Chronic pedal edema   Fosamax [Alendronate Sodium] Other (See Comments)    Unknown reaction   Ms Contin  [Morphine ] Other (See Comments)    Body spasms    Ultram  [Tramadol ] Shortness Of Breath and Palpitations   Amaryl [Glimepiride] Other (See Comments)    Unknown reaction   Januvia [Sitagliptin] Other (See Comments)    Unknown reaction   Lipitor [Atorvastatin] Swelling    Myalgias    Metformin And Related Other  (See Comments)    Gastritis    Prandin [Repaglinide] Other (See Comments)    Abdominal pain   Tradjenta [Linagliptin] Other (See Comments)    GI issues   Zoloft [Sertraline Hcl] Other (See Comments)    Altered mental state   Asa [Aspirin ] Other (See Comments)    Bleeding ulcers    Bactrim [Sulfamethoxazole-Trimethoprim] Hives   Bentyl [Dicyclomine Hcl] Other (See Comments)    Near syncope Weakness    Cipro  [Ciprofloxacin  Hcl] Nausea And Vomiting and Other (See Comments)    Stomach upset   Hctz [Hydrochlorothiazide ] Other (See Comments)    Urinary issues   Keflex [Cephalexin] Other (  See Comments)    Unknown reaction   Morphine  And Codeine Other (See Comments)    Body spasms   Norvasc  [Amlodipine  Besylate] Other (See Comments)    Weakness Dizziness    Oxycontin [Oxycodone] Other (See Comments)    Unknown reaction   Sulfa Antibiotics Hives   Penicillins Swelling and Rash    Social History:   Social History   Socioeconomic History   Marital status: Married    Spouse name: Charles   Number of children: 0   Years of education: Not on file   Highest education level: Not on file  Occupational History   Occupation: retired    Associate Professor: RETIRED  Tobacco Use   Smoking status: Never   Smokeless tobacco: Never  Vaping Use   Vaping status: Never Used  Substance and Sexual Activity   Alcohol use: No   Drug use: No   Sexual activity: Not Currently  Other Topics Concern   Not on file  Social History Narrative   Lives with husband    Right handed   Caffeine: 2 c of coffee a day   Social Drivers of Corporate investment banker Strain: Not on file  Food Insecurity: No Food Insecurity (06/18/2023)   Hunger Vital Sign    Worried About Running Out of Food in the Last Year: Never true    Ran Out of Food in the Last Year: Never true  Transportation Needs: No Transportation Needs (06/18/2023)   PRAPARE - Administrator, Civil Service (Medical): No    Lack of  Transportation (Non-Medical): No  Physical Activity: Not on file  Stress: Not on file  Social Connections: Moderately Isolated (06/18/2023)   Social Connection and Isolation Panel [NHANES]    Frequency of Communication with Friends and Family: More than three times a week    Frequency of Social Gatherings with Friends and Family: More than three times a week    Attends Religious Services: Never    Database administrator or Organizations: No    Attends Banker Meetings: Never    Marital Status: Married  Catering manager Violence: Not At Risk (06/18/2023)   Humiliation, Afraid, Rape, and Kick questionnaire    Fear of Current or Ex-Partner: No    Emotionally Abused: No    Physically Abused: No    Sexually Abused: No    Family History:    Family History  Problem Relation Age of Onset   Ovarian cancer Mother    Heart disease Father    Kidney disease Father    Diabetes Paternal Grandmother    Diabetes Brother    Hypertension Brother    Heart disease Brother    Diabetes Brother    Heart disease Brother    Microcephaly Brother    Diabetes Other    Colon cancer Other      ROS:  Please see the history of present illness.   All other ROS reviewed and negative.     Physical Exam/Data:   Vitals:   06/20/23 1058 06/20/23 1100 06/20/23 1104 06/20/23 1134  BP:   (!) 88/46 (!) 106/56  Pulse: 94 90 95 87  Resp: (!) 28 (!) 27 20 (!) 23  Temp:      TempSrc:      SpO2: 98% 98% 98% 98%  Weight:      Height:        Intake/Output Summary (Last 24 hours) at 06/20/2023 1347 Last data filed at 06/20/2023 1100 Gross  per 24 hour  Intake 240 ml  Output 1650 ml  Net -1410 ml      06/18/2023    1:36 PM 05/19/2023   10:52 PM 05/19/2023   11:13 AM  Last 3 Weights  Weight (lbs) 166 lb 14.2 oz 165 lb 9.1 oz 166 lb  Weight (kg) 75.7 kg 75.1 kg 75.297 kg     Body mass index is 28.65 kg/m.  General:  Well nourished, well developed, in no acute distress HEENT: normal Neck: no  JVD Vascular: No carotid bruits; Cardiac:  normal S1, S2; Irreg Irreg; no murmur  Lungs:  Diminished in bases Abd: soft, nontender Ext: no edema Musculoskeletal:  No deformities,  Skin: warm and dry  Neuro:  no focal abnormalities noted Psych:  Normal affect   EKG:  The EKG was personally reviewed and demonstrates:  Sinus Rhythm 74bpm Atrial fibrillation with RVR, 155bpm Telemetry:  Telemetry was personally reviewed and demonstrates:  Sinus Rhythm--> atrial fibrillation with RVR around 8am with gradual improvement in rates  Relevant CV Studies:  Echo: 06/18/2023  IMPRESSIONS     1. Left ventricular ejection fraction, by estimation, is 65 to 70%. The  left ventricle has normal function. The left ventricle has no regional  wall motion abnormalities. There is mild left ventricular hypertrophy.  Left ventricular diastolic parameters  are indeterminate.   2. Right ventricular systolic function is normal. The right ventricular  size is mildly enlarged. There is mildly elevated pulmonary artery  systolic pressure. The estimated right ventricular systolic pressure is  44.0 mmHg.   3. Left atrial size was moderately dilated.   4. Right atrial size was moderately dilated.   5. The mitral valve is degenerative. Mild mitral valve regurgitation. No  evidence of mitral stenosis.   6. Tricuspid valve regurgitation is mild to moderate.   7. The aortic valve is abnormal. There is moderate calcification of the  aortic valve. Aortic valve regurgitation is trivial. No aortic stenosis is  present.   8. Aortic dilatation noted. There is borderline dilatation of the aortic  root and of the ascending aorta, measuring 41 mm.   9. The inferior vena cava is normal in size with greater than 50%  respiratory variability, suggesting right atrial pressure of 3 mmHg.  10. Cannot exclude a small PFO.   FINDINGS   Left Ventricle: Left ventricular ejection fraction, by estimation, is 65  to 70%. The left  ventricle has normal function. The left ventricle has no  regional wall motion abnormalities. Definity  contrast agent was given IV  to delineate the left ventricular   endocardial borders. The left ventricular internal cavity size was normal  in size. There is mild left ventricular hypertrophy. Left ventricular  diastolic parameters are indeterminate.   Right Ventricle: The right ventricular size is mildly enlarged. No  increase in right ventricular wall thickness. Right ventricular systolic  function is normal. There is mildly elevated pulmonary artery systolic  pressure. The tricuspid regurgitant  velocity is 3.20 m/s, and with an assumed right atrial pressure of 3 mmHg,  the estimated right ventricular systolic pressure is 44.0 mmHg.   Left Atrium: Left atrial size was moderately dilated.   Right Atrium: Right atrial size was moderately dilated.   Pericardium: Trivial pericardial effusion is present.   Mitral Valve: The mitral valve is degenerative in appearance. Mild mitral  valve regurgitation. No evidence of mitral valve stenosis.   Tricuspid Valve: The tricuspid valve is grossly normal. Tricuspid valve  regurgitation is mild  to moderate. No evidence of tricuspid stenosis.   Aortic Valve: The aortic valve is abnormal. There is moderate  calcification of the aortic valve. Aortic valve regurgitation is trivial.  No aortic stenosis is present.   Pulmonic Valve: The pulmonic valve was normal in structure. Pulmonic valve  regurgitation is trivial. No evidence of pulmonic stenosis.   Aorta: Aortic dilatation noted. There is borderline dilatation of the  aortic root and of the ascending aorta, measuring 41 mm.   Venous: The inferior vena cava is normal in size with greater than 50%  respiratory variability, suggesting right atrial pressure of 3 mmHg.   IAS/Shunts: Cannot exclude a small PFO.    Laboratory Data:  High Sensitivity Troponin:  No results for input(s):  "TROPONINIHS" in the last 720 hours.   Chemistry Recent Labs  Lab 06/18/23 0415 06/19/23 0902 06/20/23 0424  NA 142 141 139  K 3.3* 4.9 3.9  CL 103 106 102  CO2 25 27 29   GLUCOSE 162* 120* 138*  BUN 15 13 16   CREATININE 0.95 0.99 1.09*  CALCIUM 9.0 8.9 8.9  MG 1.6* 2.1  --   GFRNONAA 58* 55* 49*  ANIONGAP 14 8 8     Recent Labs  Lab 06/18/23 0415  PROT 6.6  ALBUMIN 3.6  AST 23  ALT 15  ALKPHOS 61  BILITOT 0.6   Lipids No results for input(s): "CHOL", "TRIG", "HDL", "LABVLDL", "LDLCALC", "CHOLHDL" in the last 168 hours.  Hematology Recent Labs  Lab 06/18/23 0415 06/19/23 0902 06/20/23 0424  WBC 12.0* 9.7 10.0  RBC 4.63 4.23 4.30  HGB 11.3* 10.2* 10.4*  HCT 36.7 33.5* 33.6*  MCV 79.3* 79.2* 78.1*  MCH 24.4* 24.1* 24.2*  MCHC 30.8 30.4 31.0  RDW 16.3* 16.2* 16.2*  PLT 202 202 209   Thyroid  No results for input(s): "TSH", "FREET4" in the last 168 hours.  BNP Recent Labs  Lab 06/18/23 0552  BNP 309.7*    DDimer  Recent Labs  Lab 06/18/23 1327  DDIMER 4.61*     Radiology/Studies:  ECHOCARDIOGRAM COMPLETE Result Date: 06/19/2023    ECHOCARDIOGRAM REPORT   Patient Name:   [name] Date of Exam: 06/18/2023 Medical Rec #:  [id number]       Height:       64.0 in Accession #:    [id number]      Weight:       166.9 lb Date of Birth:  Jun 02, 1932       BSA:          1.812 m Patient Age:    89 years        BP:           120/57 mmHg Patient Gender: F               HR:           113 bpm. Exam Location:  Inpatient Procedure: 2D Echo (Both Spectral and Color Flow Doppler were utilized during            procedure). Indications:    syncope  History:        Patient has prior history of Echocardiogram examinations, most                 recent 01/03/2022. CAD and ascending aortic aneurysm,                 Arrythmias:Atrial Fibrillation; Risk Factors:Hypertension,  Dyslipidemia and Diabetes.  Sonographer:    Dione Franks RDCS Referring Phys: [id number] Arne Langdon IMPRESSIONS  1. Left ventricular ejection fraction, by estimation, is 65 to 70%. The left ventricle has normal function. The left ventricle has no regional wall motion abnormalities. There is mild left ventricular hypertrophy. Left ventricular diastolic parameters are indeterminate.  2. Right ventricular systolic function is normal. The right ventricular size is mildly enlarged. There is mildly elevated pulmonary artery systolic pressure. The estimated right ventricular systolic pressure is 44.0 mmHg.  3. Left atrial size was moderately dilated.  4. Right atrial size was moderately dilated.  5. The mitral valve is degenerative. Mild mitral valve regurgitation. No evidence of mitral stenosis.  6. Tricuspid valve regurgitation is mild to moderate.  7. The aortic valve is abnormal. There is moderate calcification of the aortic valve. Aortic valve regurgitation is trivial. No aortic stenosis is present.  8. Aortic dilatation noted. There is borderline dilatation of the aortic root and of the ascending aorta, measuring 41 mm.  9. The inferior vena cava is normal in size with greater than 50% respiratory variability, suggesting right atrial pressure of 3 mmHg. 10. Cannot exclude a small PFO. FINDINGS  Left Ventricle: Left ventricular ejection fraction, by estimation, is 65 to 70%. The left ventricle has normal function. The left ventricle has no regional wall motion abnormalities. Definity  contrast agent was given IV to delineate the left ventricular  endocardial borders. The left ventricular internal cavity size was normal in size. There is mild left ventricular hypertrophy. Left ventricular diastolic parameters are indeterminate. Right Ventricle: The right ventricular size is mildly enlarged. No increase in right ventricular wall thickness. Right ventricular systolic function is normal. There is mildly elevated pulmonary artery systolic pressure. The tricuspid regurgitant velocity is 3.20 m/s, and with an assumed  right atrial pressure of 3 mmHg, the estimated right ventricular systolic pressure is 44.0 mmHg. Left Atrium: Left atrial size was moderately dilated. Right Atrium: Right atrial size was moderately dilated. Pericardium: Trivial pericardial effusion is present. Mitral Valve: The mitral valve is degenerative in appearance. Mild mitral valve regurgitation. No evidence of mitral valve stenosis. Tricuspid Valve: The tricuspid valve is grossly normal. Tricuspid valve regurgitation is mild to moderate. No evidence of tricuspid stenosis. Aortic Valve: The aortic valve is abnormal. There is moderate calcification of the aortic valve. Aortic valve regurgitation is trivial. No aortic stenosis is present. Pulmonic Valve: The pulmonic valve was normal in structure. Pulmonic valve regurgitation is trivial. No evidence of pulmonic stenosis. Aorta: Aortic dilatation noted. There is borderline dilatation of the aortic root and of the ascending aorta, measuring 41 mm. Venous: The inferior vena cava is normal in size with greater than 50% respiratory variability, suggesting right atrial pressure of 3 mmHg. IAS/Shunts: Cannot exclude a small PFO.  LEFT VENTRICLE PLAX 2D LVIDd:         4.30 cm LVIDs:         2.80 cm LV PW:         1.10 cm LV IVS:        1.30 cm LVOT diam:     2.00 cm LV SV:         49 LV SV Index:   27 LVOT Area:     3.14 cm  RIGHT VENTRICLE          IVC RV Basal diam:  2.80 cm  IVC diam: 1.80 cm TAPSE (M-mode): 1.6 cm LEFT ATRIUM  Index        RIGHT ATRIUM           Index LA diam:        3.90 cm 2.15 cm/m   RA Area:     19.00 cm LA Vol (A2C):   77.5 ml 42.78 ml/m  RA Volume:   52.00 ml  28.71 ml/m LA Vol (A4C):   54.2 ml 29.92 ml/m LA Biplane Vol: 68.8 ml 37.98 ml/m  AORTIC VALVE LVOT Vmax:   100.00 cm/s LVOT Vmean:  67.000 cm/s LVOT VTI:    0.155 m  AORTA Ao Root diam: 4.10 cm Ao Asc diam:  4.00 cm TRICUSPID VALVE TR Peak grad:   41.0 mmHg TR Vmax:        320.00 cm/s  SHUNTS Systemic VTI:  0.16 m  Systemic Diam: 2.00 cm Grady Lawman MD Electronically signed by Grady Lawman MD Signature Date/Time: 06/19/2023/10:03:48 PM    Final    CT Angio Chest Pulmonary Embolism (PE) W or WO Contrast Result Date: 06/18/2023 CLINICAL DATA:  Recent fall with chest pain, initial encounter EXAM: CT ANGIOGRAPHY CHEST WITH CONTRAST TECHNIQUE: Multidetector CT imaging of the chest was performed using the standard protocol during bolus administration of intravenous contrast. Multiplanar CT image reconstructions and MIPs were obtained to evaluate the vascular anatomy. RADIATION DOSE REDUCTION: This exam was performed according to the departmental dose-optimization program which includes automated exposure control, adjustment of the mA and/or kV according to patient size and/or use of iterative reconstruction technique. CONTRAST:  75mL OMNIPAQUE  IOHEXOL  350 MG/ML SOLN COMPARISON:  Chest x-ray from earlier in the same day, CT from 05/19/2023 FINDINGS: Cardiovascular: Atherosclerotic calcifications of the thoracic aorta are noted. The ascending aorta measures 4.5 cm in greatest dimension. No dissection is seen. The pulmonary artery shows a normal branching pattern bilaterally. No pulmonary embolus is seen. Heart is mildly enlarged in size. Mild coronary calcifications are noted. Mediastinum/Nodes: Thoracic inlet is within normal limits. No hilar or mediastinal adenopathy is noted. The esophagus as visualized is within normal limits. Lungs/Pleura: Mild emphysematous changes are noted in the lungs bilaterally. No focal infiltrate or effusion is seen. No parenchymal nodules are noted. Upper Abdomen: No acute abnormality. Musculoskeletal: Degenerative changes of the thoracic spine are seen. No acute bony abnormality is noted. Review of the MIP images confirms the above findings. IMPRESSION: No evidence of pulmonary embolism. Dilatation of the ascending aorta to 4.5 cm. Ascending thoracic aortic aneurysm. Recommend semi-annual  imaging followup by CTA or MRA and referral to cardiothoracic surgery if not already obtained. This recommendation follows 2010 ACCF/AHA/AATS/ACR/ASA/SCA/SCAI/SIR/STS/SVM Guidelines for the Diagnosis and Management of Patients With Thoracic Aortic Disease. Circulation. 2010; 121: U981-X914. Aortic aneurysm NOS (ICD10-I71.9) No other focal abnormality is noted. Aortic Atherosclerosis (ICD10-I70.0) and Emphysema (ICD10-J43.9). Electronically Signed   By: Violeta Grey M.D.   On: 06/18/2023 23:48   DG Chest Portable 1 View Result Date: 06/18/2023 CLINICAL DATA:  Syncopal episode. EXAM: PORTABLE CHEST 1 VIEW COMPARISON:  Portable chest 05/19/2023. FINDINGS: The lungs are clear with mild chronic elevation of the right diaphragm. The inferior left CP sulcus was excluded from the exam. The heart is enlarged but unchanged. No vascular congestion is seen. Stable mediastinum with aortic tortuosity and calcification. There is a tangle of overlying monitor wiring. No new osseous findings. Osteopenia. IMPRESSION: No evidence of acute chest disease. Stable cardiomegaly. Aortic atherosclerosis. Electronically Signed   By: Denman Fischer M.D.   On: 06/18/2023 07:24   CT Head Wo Contrast Result  Date: 06/18/2023 CLINICAL DATA:  Marvell Slider and hit back of head and neck trauma. EXAM: CT HEAD WITHOUT CONTRAST CT CERVICAL SPINE WITHOUT CONTRAST TECHNIQUE: Multidetector CT imaging of the head and cervical spine was performed following the standard protocol without intravenous contrast. Multiplanar CT image reconstructions of the cervical spine were also generated. RADIATION DOSE REDUCTION: This exam was performed according to the departmental dose-optimization program which includes automated exposure control, adjustment of the mA and/or kV according to patient size and/or use of iterative reconstruction technique. COMPARISON:  Head CT 06/30/2021, CTA and neck and reformats 01/16/2018. FINDINGS: CT HEAD FINDINGS Brain: No hemorrhage, mass  effect or acute infarct is seen. There are dystrophic calcifications in the falx. There is mild atrophy, small-vessel disease and atrophic ventriculomegaly, and a chronic linear infarct in the inferior left cerebellar hemisphere. There is no midline shift.  The basal cisterns are clear Vascular: There is spray artifact from aneurysm coils again noted in the right cavernous sinus region and there appears to have been a prior attempted stenting of the right petrous ICA. No hyperdense central vessel is seen through the metal artifacts. There are carotid calcifications. Skull: Negative for fractures or focal lesions. Hyperostosis frontalis interna. Large dorsal right parieto-occipital scalp hematoma. Sinuses/Orbits: No acute findings. S shaped nasal septum. Old lens replacements. Other: None. CT CERVICAL SPINE FINDINGS Alignment: The patient's head is turned to the right but there is no C1-2 offset. There is no widening of the anterior atlanto dental joint. There is chronic minimal degenerative anterolisthesis at C3-4, C4-5 and C7-T1, seen previously. Mild reversed lordosis. There is no traumatic or new listhesis. Skull base and vertebrae: No acute fracture is evident. There is generalized osteopenia. No primary bone lesion or focal pathologic process. Soft tissues and spinal canal: No prevertebral fluid or swelling. No visible canal hematoma. There is mild calcification in the proximal left cervical ICA. No thyroid  or laryngeal mass. Disc levels: there is disc space loss c3-4 through c6-7 with bidirectional endplate osteophytes, normal disc heights c2-3 and c7-t1. There are mild posterior disc osteophyte complexes from C3-4 through C6-7, slightly deforming the ventral cord surface without frank compression. Moderate facet hypertrophy is seen along with uncinate spurring. Acquired foraminal stenosis is moderate to severe on the right and mild on the left at C3-4, bilaterally mild C4-5, moderate on the left and mild on  the right at C5-6, mild on the right at C6-7. Upper chest: There is bilateral apical scarring change. Otherwise negative. Other: None. IMPRESSION: 1. No acute intracranial CT findings or depressed skull fractures. 2. Large dorsal right parieto-occipital scalp hematoma. 3. Atrophy, small-vessel disease and chronic linear left cerebral infarct 4. 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 5. Osteopenia and degenerative change without evidence of cervical fractures. 6. Reversed lordosis and minimal degenerative listhesis. 7. Carotid calcifications. Electronically Signed   By: Denman Fischer M.D.   On: 06/18/2023 05:36   CT Cervical Spine Wo Contrast Result Date: 06/18/2023 CLINICAL DATA:  Marvell Slider and hit back of head and neck trauma. EXAM: CT HEAD WITHOUT CONTRAST CT CERVICAL SPINE WITHOUT CONTRAST TECHNIQUE: Multidetector CT imaging of the head and cervical spine was performed following the standard protocol without intravenous contrast. Multiplanar CT image reconstructions of the cervical spine were also generated. RADIATION DOSE REDUCTION: This exam was performed according to the departmental dose-optimization program which includes automated exposure control, adjustment of the mA and/or kV according to patient size and/or use of  iterative reconstruction technique. COMPARISON:  Head CT 06/30/2021, CTA and neck and reformats 01/16/2018. FINDINGS: CT HEAD FINDINGS Brain: No hemorrhage, mass effect or acute infarct is seen. There are dystrophic calcifications in the falx. There is mild atrophy, small-vessel disease and atrophic ventriculomegaly, and a chronic linear infarct in the inferior left cerebellar hemisphere. There is no midline shift.  The basal cisterns are clear Vascular: There is spray artifact from aneurysm coils again noted in the right cavernous sinus region and there appears to have been a prior attempted stenting of the right petrous ICA.  No hyperdense central vessel is seen through the metal artifacts. There are carotid calcifications. Skull: Negative for fractures or focal lesions. Hyperostosis frontalis interna. Large dorsal right parieto-occipital scalp hematoma. Sinuses/Orbits: No acute findings. S shaped nasal septum. Old lens replacements. Other: None. CT CERVICAL SPINE FINDINGS Alignment: The patient's head is turned to the right but there is no C1-2 offset. There is no widening of the anterior atlanto dental joint. There is chronic minimal degenerative anterolisthesis at C3-4, C4-5 and C7-T1, seen previously. Mild reversed lordosis. There is no traumatic or new listhesis. Skull base and vertebrae: No acute fracture is evident. There is generalized osteopenia. No primary bone lesion or focal pathologic process. Soft tissues and spinal canal: No prevertebral fluid or swelling. No visible canal hematoma. There is mild calcification in the proximal left cervical ICA. No thyroid  or laryngeal mass. Disc levels: there is disc space loss c3-4 through c6-7 with bidirectional endplate osteophytes, normal disc heights c2-3 and c7-t1. There are mild posterior disc osteophyte complexes from C3-4 through C6-7, slightly deforming the ventral cord surface without frank compression. Moderate facet hypertrophy is seen along with uncinate spurring. Acquired foraminal stenosis is moderate to severe on the right and mild on the left at C3-4, bilaterally mild C4-5, moderate on the left and mild on the right at C5-6, mild on the right at C6-7. Upper chest: There is bilateral apical scarring change. Otherwise negative. Other: None. IMPRESSION: 1. No acute intracranial CT findings or depressed skull fractures. 2. Large dorsal right parieto-occipital scalp hematoma. 3. Atrophy, small-vessel disease and chronic linear left cerebral infarct 4. 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 5. Osteopenia and  degenerative change without evidence of cervical fractures. 6. Reversed lordosis and minimal degenerative listhesis. 7. Carotid calcifications. Electronically Signed   By: Denman Fischer M.D.   On: 06/18/2023 05:36     Assessment and Plan:   [name] is a 89 y.o. female with a hx of HFpEF, paroxsymal atrial fibrillation, coronary artery calcification, mitral regurgitation, DM, thoracic aortic aneurysm who is being seen 06/20/2023 for the evaluation of atrial fibrillation at the request of Dr. Melonie Square.  Atrial fibrillation with RVR -- known hx of the same, present with fall and developed recurrent atrial fibrillation while in the ED. Initially treated with IV diltiazem  and converted to sinus rhythm. Developed recurrent atrial fibrillation with RVR this morning. Given IV Diltiazem  20mg  x1. BP dropped briefly to 88 systolic Had previously been on amiodarone , unclear why this was stopped at prior hospitalization -- currently on coreg  12.5mg  BID (BPs high on admission), on metoprolol  PTA. -- will resume amiodarone  200mg  BID (as her rates in the 60s when in sinus rhythm) as she is mostly asymptomatic with rates with stable BPs -- Will reduce coreg  to 6.25mg  BID with soft BPs and resumption of amiodarone  -- continue Eliquis  5mg  BID  Fall Possible syncope --  events leading up to fall are unclear, most recent orthostatics negative -- has TED hose  Acute on Chronic HFpEF -- CXR negative on admission, BNP elevated 309, does report some shortness of breath on exam. Feels "winded" with simple conversation. -- s/p IV lasix , net - 1.1L -- does has diminished lung sounds in bases, ok to continue IV lasix  through today. Likely back to PO tomorrow  HTN -- BP significantly elevated on admission, improved. BP dropped in the 80s this afternoon after receiving IV Diltiazem  -- reduced coreg  to 6.25mg  BID, continue hydralazine  10mg  BID (home spiro held for now)  Risk Assessment/Risk Scores:   [place] Heart Association (NYHA) Functional Class NYHA Class II  CHA2DS2-VASc Score = 7   This indicates a 11.2% annual risk of stroke. The patient's score is based upon: CHF History: 1 HTN History: 1 Diabetes History: 1 Stroke History: 0 Vascular Disease History: 1 Age Score: 2 Gender Score: 1    For questions or updates, please contact [facility] Please consult [email or web address] for contact info under    Signed, Johnie Nailer, NP  06/20/2023 1:47 PM

## 2023-06-21 DIAGNOSIS — I480 Paroxysmal atrial fibrillation: Secondary | ICD-10-CM

## 2023-06-21 DIAGNOSIS — I5033 Acute on chronic diastolic (congestive) heart failure: Secondary | ICD-10-CM

## 2023-06-21 DIAGNOSIS — I4891 Unspecified atrial fibrillation: Secondary | ICD-10-CM | POA: Diagnosis not present

## 2023-06-21 LAB — BASIC METABOLIC PANEL WITH GFR
Anion gap: 8 (ref 5–15)
BUN: 19 mg/dL (ref 8–23)
CO2: 30 mmol/L (ref 22–32)
Calcium: 8.5 mg/dL — ABNORMAL LOW (ref 8.9–10.3)
Chloride: 101 mmol/L (ref 98–111)
Creatinine, Ser: 1.13 mg/dL — ABNORMAL HIGH (ref 0.44–1.00)
GFR, Estimated: 47 mL/min — ABNORMAL LOW (ref >60)
Glucose, Bld: 141 mg/dL — ABNORMAL HIGH (ref 70–99)
Potassium: 4.1 mmol/L (ref 3.5–5.1)
Sodium: 139 mmol/L (ref 135–145)

## 2023-06-21 LAB — GLUCOSE, CAPILLARY
Glucose-Capillary: 125 mg/dL — ABNORMAL HIGH (ref 70–99)
Glucose-Capillary: 226 mg/dL — ABNORMAL HIGH (ref 70–99)
Glucose-Capillary: 136 mg/dL — ABNORMAL HIGH (ref 70–99)
Glucose-Capillary: 178 mg/dL — ABNORMAL HIGH (ref 70–99)

## 2023-06-21 LAB — TSH: TSH: 1.313 u[IU]/mL (ref 0.350–4.500)

## 2023-06-21 LAB — MAGNESIUM: Magnesium: 1.7 mg/dL (ref 1.7–2.4)

## 2023-06-21 MED ORDER — SPIRONOLACTONE 12.5 MG HALF TABLET
12.5000 mg | ORAL_TABLET | Freq: Every day | ORAL | Status: DC
  Administered 2023-06-21 – 2023-06-23 (×3): 12.5 mg via ORAL
  Filled 2023-06-21 (×3): qty 1

## 2023-06-21 MED ORDER — MAGNESIUM SULFATE 2 GM/50ML IV SOLN
2.0000 g | Freq: Once | INTRAVENOUS | Status: AC
  Administered 2023-06-21: 2 g via INTRAVENOUS
  Filled 2023-06-21: qty 50

## 2023-06-21 NOTE — Plan of Care (Signed)
   Problem: Nutritional: Goal: Maintenance of adequate nutrition will improve Outcome: Progressing

## 2023-06-21 NOTE — Progress Notes (Addendum)
 Progress Note  Patient Name: [name] Date of Encounter: 06/21/2023  Primary Cardiologist: Gaylyn Keas, MD  Subjective   Feeling better today. Still SOB with some activity. Converted to NSR with occasional PACs just before 7am. No chest pain.  Inpatient Medications    Scheduled Meds:  amiodarone   200 mg Oral BID   apixaban   5 mg Oral BID   furosemide   20 mg Intravenous Daily   Gerhardt's butt cream   Topical BID   hydrALAZINE   10 mg Oral BID   insulin  aspart  0-15 Units Subcutaneous TID WC   insulin  glargine-yfgn  10 Units Subcutaneous Daily   melatonin  3 mg Oral QHS   metoprolol  tartrate  25 mg Oral BID   pantoprazole   40 mg Oral Q supper   Continuous Infusions:  PRN Meds: acetaminophen , docusate sodium , hydrALAZINE , LORazepam , ondansetron  (ZOFRAN ) IV   Vital Signs    Vitals:   06/20/23 1900 06/20/23 2132 06/21/23 0253 06/21/23 0726  BP: 133/66 (!) 157/90 134/82 (!) 165/68  Pulse: (!) 107  (!) 111 68  Resp: 20  20 (!) 24  Temp: 98.4 F (36.9 C)  98 F (36.7 C) 98.3 F (36.8 C)  TempSrc: Oral  Oral Oral  SpO2:    92%  Weight:      Height:        Intake/Output Summary (Last 24 hours) at 06/21/2023 1009 Last data filed at 06/21/2023 0800 Gross per 24 hour  Intake 360 ml  Output 750 ml  Net -390 ml      06/18/2023    1:36 PM 05/19/2023   10:52 PM 05/19/2023   11:13 AM  Last 3 Weights  Weight (lbs) 166 lb 14.2 oz 165 lb 9.1 oz 166 lb  Weight (kg) 75.7 kg 75.1 kg 75.297 kg     Telemetry    Converted to NSR with PACs around 6:51am - Personally Reviewed  Physical Exam   GEN: No acute distress.  HEENT: Normocephalic, atraumatic, sclera non-icteric. Neck: No JVD or bruits. Cardiac: RRR no murmurs, rubs, or gallops.  Respiratory: Mildly decreased BS throughout bilaterally without wheezing, rales or rhonchi. Breathing is unlabored. GI: Soft, nontender, non-distended, BS +x 4. MS: no deformity. Extremities: No clubbing or cyanosis. Mild BLE edema  with TED hose in place. Distal pedal pulses are 2+ and equal bilaterally. Neuro:  AAOx3. Follows commands. Psych:  Responds to questions appropriately with a normal affect.  Labs    High Sensitivity Troponin:  No results for input(s): "TROPONINIHS" in the last 720 hours.    Cardiac EnzymesNo results for input(s): "TROPONINI" in the last 168 hours. No results for input(s): "TROPIPOC" in the last 168 hours.   Chemistry Recent Labs  Lab 06/18/23 0415 06/19/23 0902 06/20/23 0424 06/21/23 0320  NA 142 141 139 139  K 3.3* 4.9 3.9 4.1  CL 103 106 102 101  CO2 25 27 29 30   GLUCOSE 162* 120* 138* 141*  BUN 15 13 16 19   CREATININE 0.95 0.99 1.09* 1.13*  CALCIUM 9.0 8.9 8.9 8.5*  PROT 6.6  --   --   --   ALBUMIN 3.6  --   --   --   AST 23  --   --   --   ALT 15  --   --   --   ALKPHOS 61  --   --   --   BILITOT 0.6  --   --   --   GFRNONAA 58* 55* 49*  47*  ANIONGAP 14 8 8 8      Hematology Recent Labs  Lab 06/18/23 0415 06/19/23 0902 06/20/23 0424  WBC 12.0* 9.7 10.0  RBC 4.63 4.23 4.30  HGB 11.3* 10.2* 10.4*  HCT 36.7 33.5* 33.6*  MCV 79.3* 79.2* 78.1*  MCH 24.4* 24.1* 24.2*  MCHC 30.8 30.4 31.0  RDW 16.3* 16.2* 16.2*  PLT 202 202 209    BNP Recent Labs  Lab 06/18/23 0552  BNP 309.7*     DDimer  Recent Labs  Lab 06/18/23 1327  DDIMER 4.61*     Radiology    No results found.  Cardiac Studies   Echo 06/18/23   1. Left ventricular ejection fraction, by estimation, is 65 to 70%. The  left ventricle has normal function. The left ventricle has no regional  wall motion abnormalities. There is mild left ventricular hypertrophy.  Left ventricular diastolic parameters  are indeterminate.   2. Right ventricular systolic function is normal. The right ventricular  size is mildly enlarged. There is mildly elevated pulmonary artery  systolic pressure. The estimated right ventricular systolic pressure is  44.0 mmHg.   3. Left atrial size was moderately dilated.    4. Right atrial size was moderately dilated.   5. The mitral valve is degenerative. Mild mitral valve regurgitation. No  evidence of mitral stenosis.   6. Tricuspid valve regurgitation is mild to moderate.   7. The aortic valve is abnormal. There is moderate calcification of the  aortic valve. Aortic valve regurgitation is trivial. No aortic stenosis is  present.   8. Aortic dilatation noted. There is borderline dilatation of the aortic  root and of the ascending aorta, measuring 41 mm.   9. The inferior vena cava is normal in size with greater than 50%  respiratory variability, suggesting right atrial pressure of 3 mmHg.  10. Cannot exclude a small PFO.    Patient Profile     89 y.o. female with chronic HFpEF, paroxsymal atrial fibrillation, coronary artery calcification with elevated troponin 12/2021 in setting of afib (opted for conservative management), elevated troponin 05/2023 felt due to demand ischemia, mitral regurgitation, DM, thoracic aortic aneurysm, probable CKD 3a by labs, anemia. Prior admissions 12/2021, 01/2022, 02/2023, 05/2023 with atrial fib - converted to NSR on diltiazem  drip as of last admission. Lost her husband of 50 years 3 weeks ago (they were in ALF together). Presented to the ED 06/18/23 with unwitnessed fall from ALF - got up to go to the bathroom, doesn't remember much, confused on arrival to the ED. Initially in NSR but developed AF RVR during admission.  Assessment & Plan   1. Unwitnessed fall (mechanical vs syncope) with confusion upon presentation to ED 2. Recurrent atrial fibrillation with RVR, labile blood pressure this admission 3. QT prolongation - known hx of the same. Initially in NSR with prolonged QT on presentation then developed recurrent AF in the ED. Treated with IV diltiazem  and converted to sinus rhythm. Developed recurrent atrial fibrillation with RVR 5/20 and given IV diltiazem  bolus, then BP dropped briefly to 88 systolic. Had previously been  on amiodarone , unclear why this was stopped at prior hospitalization (last on this 02/2023, no longer taking as of 05/2023, daughter indicated stopped by cardiology but I do not see any notes mentioning this), restarted this admission - metoprolol  briefly changed to carvedilol  due to BP then changed back to metoprolol  given soft BP - current rx: amiodarone  200mg  BID, metoprolol  tartrate 25mg  BID, hydralazine  10mg  BID, apixaban  5mg   BID - add updated TSH to labs (normal 1 mo ago) - will review updated plan with MD - await input from MD whether post dc monitoring is anticipated vs observation for recurrent events - will order updated EKG to assess QT prolongation - difficult to assess on telemetry  4. Borderline hypoxia - appears this was an issue during previous hospitalization - was on [place] O2 earlier this admission - had O2 sats in the high 80s earlier this AM, possibly sleeping at that time (raising question of OSA) - but currently 94-96% - have requested ambulatory O2 assessment by care order  5. Acute on chronic HFpEF - may be driven by #2 - CXR negative on admission, BNP elevated 309 - s/p low dose IV Lasix , unclear if I/O's accurate, no updated weight available yet (will order) - will pause further dosing and review with MD  - ? Consider MRA - pending O2 assessment as above as well  6. Microcytic anemia - chronic appearing waxing/waning anemia over the years though 12-13 in 2024, now 10-11 range - anemia panel in AM - ordered hemoccult - per primary team  7. Coronary artery calcifications - prior troponin elevations noted, with significant elevation 12/2021 of 3k, opting for conservative management at that time, also elevation to 815 in 05/2023 felt due to demand ischemia - not checked this admission but EKG showed diffuse ST depression suspected rate related - d/w Dr. Veryl Gottron - anticipate conservative strategy with treatment of underlying Afib - no ASA given Eliquis /anemia, already  on BB, prior intolerances to pravastatin  + atorvastatin noted  6. Mild MR, mild-moderate TR, trivial AI, possible small PFO - follow clinically as outpatient  7. Ascending TAA - 4.5cm by CT this admission, no acute intervention needed, can review plan for monitoring as outpatient   8. Hypokalemia, hypomagnesemia - K improved - Mag remains borderline low again, will supplement - may need standing supplementation in the future  For questions or updates, please contact [facility] Please consult [email or web address] for contact info under Cardiology/STEMI.  Signed, Kimoni Pickerill N Huck Ashworth, PA-C 06/21/2023, 10:09 AM

## 2023-06-21 NOTE — Progress Notes (Signed)
 EKG reviewed, QTC confirmed normal at . Shows NSR with PACs with nonspecific STT changes.

## 2023-06-21 NOTE — Progress Notes (Signed)
 Physical Therapy Treatment Patient Details Name: [name] MRN: [id number] DOB: 26-Nov-1932 Today's Date: 06/21/2023   History of Present Illness 89 y.o. female admitted 06/18/23 due to unwitnessed fall, posterior scalp laceration, and +afib with RVR. Pt with no recall of fall up through waking up in the hospital. Does not recall feeling dizzy prior.  PMH includes DM2, CAD, HTN, AAA, PVD, TIA, cerebral aneurysm s/p coiling, IBS, duodenitis, anxiety, afib.    PT Comments  Pt up in chair on arrival, pleasant and agreeable to session with good progress towards acute goals. Pt demonstrating increased activity tolerance this session, progressing gait distance with RW for support and grossly CGA for safety. Pt without c/o dizziness/lightheadedness throughout mobility however noted DOE 2/4 with SpO2 94-98% on RA. Pt was educated on continued walker use to maximize functional independence, safety, and decrease risk for falls with pt verbalizing understanding. Pt continues to benefit from skilled PT services to progress toward functional mobility goals.     If plan is discharge home, recommend the following: A little help with walking and/or transfers;A little help with bathing/dressing/bathroom;Direct supervision/assist for medications management;Direct supervision/assist for financial management;Assist for transportation;Help with stairs or ramp for entrance   Can travel by private vehicle        Equipment Recommendations  None recommended by PT    Recommendations for Other Services       Precautions / Restrictions Precautions Precautions: Fall;Other (comment) Precaution/Restrictions Comments: +orthostasis Restrictions Weight Bearing Restrictions Per Provider Order: No     Mobility  Bed Mobility Overal bed mobility: Needs Assistance         Sit to supine: Supervision   General bed mobility comments: up in chair on arrival, supervision with cues to center once supine     Transfers Overall transfer level: Needs assistance Equipment used: Rolling walker (2 wheels) Transfers: Sit to/from Stand Sit to Stand: Contact guard assist           General transfer comment: cues for hand placement    Ambulation/Gait Ambulation/Gait assistance: Contact guard assist Gait Distance (Feet): 80 Feet Assistive device: Rolling walker (2 wheels) Gait Pattern/deviations: Step-through pattern, Decreased stride length, Trunk flexed Gait velocity: decr     General Gait Details: no c/o dizziness, some DOE 2/4, SpO2 94-98% on RA   Stairs             Wheelchair Mobility     Tilt Bed    Modified Rankin (Stroke Patients Only)       Balance Overall balance assessment: Mild deficits observed, not formally tested                                          Communication Communication Communication: Impaired Factors Affecting Communication: Hearing impaired  Cognition Arousal: Alert Behavior During Therapy: WFL for tasks assessed/performed                             Following commands: Intact      Cueing Cueing Techniques: Verbal cues, Gestural cues  Exercises      General Comments        Pertinent Vitals/Pain Pain Assessment Pain Assessment: No/denies pain    Home Living                          Prior Function  PT Goals (current goals can now be found in the care plan section) Acute Rehab PT Goals Patient Stated Goal: return to ALF and her PT, [name] PT Goal Formulation: With patient Time For Goal Achievement: 07/03/23 Progress towards PT goals: Progressing toward goals    Frequency    Min 2X/week      PT Plan      Co-evaluation              AM-PAC PT "6 Clicks" Mobility   Outcome Measure  Help needed turning from your back to your side while in a flat bed without using bedrails?: None Help needed moving from lying on your back to sitting on the side of a flat bed  without using bedrails?: A Little Help needed moving to and from a bed to a chair (including a wheelchair)?: A Little Help needed standing up from a chair using your arms (e.g., wheelchair or bedside chair)?: A Little Help needed to walk in hospital room?: A Little Help needed climbing 3-5 steps with a railing? : A Lot 6 Click Score: 18    End of Session Equipment Utilized During Treatment: Gait belt Activity Tolerance: Patient limited by fatigue Patient left: in bed;with call bell/phone within reach;with bed alarm set Nurse Communication: Mobility status;Other (comment) (SpO2 levels) PT Visit Diagnosis: Difficulty in walking, not elsewhere classified (R26.2)     Time: 0981-1914 PT Time Calculation (min) (ACUTE ONLY): 24 min  Charges:    $Gait Training: 8-22 mins $Therapeutic Activity: 8-22 mins PT General Charges $$ ACUTE PT VISIT: 1 Visit                     Jhada Risk R. PTA Acute Rehabilitation Services Office: [phone]   Agapito Horseman 06/21/2023, 1:54 PM

## 2023-06-21 NOTE — Plan of Care (Signed)

## 2023-06-21 NOTE — Progress Notes (Signed)
 PROGRESS NOTE    [name]  ION:[id number] DOB: Nov 02, 1932 DOA: 06/18/2023 PCP: [facility]   Brief Narrative:  This 89 year old female with PMH significant of CAD, AAA, status post coiling of cerebral artery aneurysm, A-fib, hypertension, hyperlipidemia, diabetes who presented with unwitnessed fall, She was found to be in A-fib with RVR.  Patient is a poor historian.  She remember going to the restroom and then waking up in the hospital.  She was admitted for further evaluation.  Cardiology is consulted.  Assessment & Plan:   Principal Problem:   Atrial fibrillation with rapid ventricular response (HCC)   A-fib with RVR: Patient presented with unwitnessed fall and found to be in A-fib with RVR. She has missed a.m. dose of metoprolol  prior to the admission. Per Daughter Amiodarone  was stopped by cardiology some time ago. Unclear documentation. CT head: No acute intracranial abnormalities. Large dorsal right parieto-occipital scalp hematoma.  Chest x-ray, urine culture > unremarkable. PT OT evaluation pending. Started Coreg  6.25 twice daily She was started on IV diltiazem , which was discontinued on 5/18. TTE reveals EF 65 to 70%, mild LVH, left atrial dilatation. Patient went back into RVR on 06/20/2023. Cardiology consulted.  Continue amiodarone  200 mg twice daily, metoprolol  25 mg twice daily, hydralazine  10 mg twice daily and Eliquis  5 mg twice daily.   Presumed syncope: Orthostatic BP Positive. Continue TED hose.  Changed hydralazine  from TID to BID   Acute on chronic Diastolic Dysfunction: Per daughter patient has been retaining more fluids.  BNP slightly elevated 309 She is on lasix  3 times week or as needed.  On Spironolactone  Three times a week.  Started on IV lasix . Dose reduced to 20 mg daily due to increase in creatinine.    Ground-level fall: PT/OT consulted and recommended HHPT   Right posterior scalp laceration: Due to fall..  Continue  local care.    Hypokalemia, hypomagnesemia - Replete as needed.    Diabetes type 2, controlled" Last A1c (05/19/2023) was 6.6. Continue SSI Continue Semglee  10 units daily.    Pyuria; Urine culture no growth to date.   Estimated body mass index is 28.65 kg/m as calculated from the following:   Height as of this encounter: 5\' 4"  (1.626 m).   Weight as of this encounter: 75.7 kg.     DVT prophylaxis: Eliquis  Code Status: Full code Family Communication:  No family at bed side Disposition Plan:  Status is: Inpatient Remains inpatient appropriate because: Admitted for A-fib with RVR.  Cardiology is consulted.    Consultants:  Cardiology  Procedures:  Antimicrobials:  Anti-infectives (From admission, onward)    None      Subjective: Patient was seen and examined at bedside.  Overnight events noted. Patient reports she is feeling better,  heart rate is much controlled. She denies any pain, Shortness of breath.   Objective: Vitals:   06/21/23 1022 06/21/23 1107 06/21/23 1110 06/21/23 1208  BP:  (!) 124/56  (!) 121/90  Pulse:  62  68  Resp:  (!) 30 (!) 25   Temp:  97.9 F (36.6 C)  97.8 F (36.6 C)  TempSrc:  Oral  Oral  SpO2:  94%  96%  Weight: 75 kg     Height:        Intake/Output Summary (Last 24 hours) at 06/21/2023 1316 Last data filed at 06/21/2023 1100 Gross per 24 hour  Intake 480 ml  Output 100 ml  Net 380 ml   Filed Weights   06/18/23  1336 06/21/23 1022  Weight: 75.7 kg 75 kg    Examination:  General exam: Appears calm and comfortable, not in any acute distress. Respiratory system: CTA Bilaterally. Respiratory effort normal.  RR 16 Cardiovascular system: S1 & S2 heard, RRR. No JVD, murmurs, rubs, gallops or clicks. Gastrointestinal system: Abdomen is non distended, soft and non tender.  Normal bowel sounds heard. Central nervous system: Alert and oriented x 3. No focal neurological deficits. Extremities: Edema+, no cyanosis, no  clubbing Skin: No rashes, lesions or ulcers Psychiatry: Judgement and insight appear normal. Mood & affect appropriate.     Data Reviewed: I have personally reviewed following labs and imaging studies  CBC: Recent Labs  Lab 06/18/23 0415 06/19/23 0902 06/20/23 0424  WBC 12.0* 9.7 10.0  NEUTROABS 5.6  --   --   HGB 11.3* 10.2* 10.4*  HCT 36.7 33.5* 33.6*  MCV 79.3* 79.2* 78.1*  PLT 202 202 209   Basic Metabolic Panel: Recent Labs  Lab 06/18/23 0415 06/19/23 0902 06/20/23 0424 06/21/23 0320  NA 142 141 139 139  K 3.3* 4.9 3.9 4.1  CL 103 106 102 101  CO2 25 27 29 30   GLUCOSE 162* 120* 138* 141*  BUN 15 13 16 19   CREATININE 0.95 0.99 1.09* 1.13*  CALCIUM 9.0 8.9 8.9 8.5*  MG 1.6* 2.1  --  1.7   GFR: Estimated Creatinine Clearance: 34.1 mL/min (A) (by C-G formula based on SCr of 1.13 mg/dL (H)). Liver Function Tests: Recent Labs  Lab 06/18/23 0415  AST 23  ALT 15  ALKPHOS 61  BILITOT 0.6  PROT 6.6  ALBUMIN 3.6   No results for input(s): "LIPASE", "AMYLASE" in the last 168 hours. No results for input(s): "AMMONIA" in the last 168 hours. Coagulation Profile: No results for input(s): "INR", "PROTIME" in the last 168 hours. Cardiac Enzymes: No results for input(s): "CKTOTAL", "CKMB", "CKMBINDEX", "TROPONINI" in the last 168 hours. BNP (last 3 results) No results for input(s): "PROBNP" in the last 8760 hours. HbA1C: No results for input(s): "HGBA1C" in the last 72 hours. CBG: Recent Labs  Lab 06/20/23 1227 06/20/23 1621 06/20/23 2129 06/21/23 0612 06/21/23 1112  GLUCAP 231* 155* 140* 125* 226*   Lipid Profile: No results for input(s): "CHOL", "HDL", "LDLCALC", "TRIG", "CHOLHDL", "LDLDIRECT" in the last 72 hours. Thyroid  Function Tests: Recent Labs    06/21/23 0319  TSH 1.313   Anemia Panel: No results for input(s): "VITAMINB12", "FOLATE", "FERRITIN", "TIBC", "IRON", "RETICCTPCT" in the last 72 hours. Sepsis Labs: No results for input(s):  "PROCALCITON", "LATICACIDVEN" in the last 168 hours.  Recent Results (from the past 240 hours)  Urine Culture     Status: None   Collection Time: 06/18/23  7:35 AM   Specimen: Urine, Clean Catch  Result Value Ref Range Status   Specimen Description URINE, CLEAN CATCH  Final   Special Requests NONE  Final   Culture   Final    NO GROWTH Performed at [facility], [address], [place], [place] [address]    Report Status 06/19/2023 FINAL  Final    Radiology Studies: No results found.  Scheduled Meds:  amiodarone   200 mg Oral BID   apixaban   5 mg Oral BID   Gerhardt's butt cream   Topical BID   hydrALAZINE   10 mg Oral BID   insulin  aspart  0-15 Units Subcutaneous TID WC   insulin  glargine-yfgn  10 Units Subcutaneous Daily   melatonin  3 mg Oral QHS  metoprolol  tartrate  25 mg Oral BID   pantoprazole   40 mg Oral Q supper   spironolactone   12.5 mg Oral Daily   Continuous Infusions:     LOS: 3 days    Time spent: 50 mins    Magdalene School, MD [facility]   If 7PM-7AM, please contact night-coverage

## 2023-06-21 NOTE — Progress Notes (Signed)
 Heart Failure Navigator Progress Note  Assessed for Heart & Vascular TOC clinic readiness.  Patient with EF 65-70%. Has a [facility] appt on 6/3. Will not schedule HF TOC at this time.   Navigator available for reassessment of patient.   Jerilyn Monte, PharmD, BCPS Heart Failure Stewardship Pharmacist Phone [phone]

## 2023-06-21 NOTE — Progress Notes (Signed)
 SATURATION QUALIFICATIONS: (This note is used to comply with regulatory documentation for home oxygen)  Patient Saturations on Room Air at Rest = 98%  Patient Saturations on Room Air while Ambulating = 94%  Please briefly explain why patient needs home oxygen: N/A: Pt maintains safe saturation on room air at rest and with mobility.    Beverly Buckler. PTA Acute Rehabilitation Services Office: [phone]

## 2023-06-22 DIAGNOSIS — I4891 Unspecified atrial fibrillation: Secondary | ICD-10-CM | POA: Diagnosis not present

## 2023-06-22 DIAGNOSIS — I10 Essential (primary) hypertension: Secondary | ICD-10-CM

## 2023-06-22 LAB — BASIC METABOLIC PANEL WITH GFR
Anion gap: 7 (ref 5–15)
BUN: 22 mg/dL (ref 8–23)
CO2: 31 mmol/L (ref 22–32)
Calcium: 8.5 mg/dL — ABNORMAL LOW (ref 8.9–10.3)
Chloride: 101 mmol/L (ref 98–111)
Creatinine, Ser: 1.17 mg/dL — ABNORMAL HIGH (ref 0.44–1.00)
GFR, Estimated: 45 mL/min — ABNORMAL LOW (ref >60)
Glucose, Bld: 142 mg/dL — ABNORMAL HIGH (ref 70–99)
Potassium: 4 mmol/L (ref 3.5–5.1)
Sodium: 139 mmol/L (ref 135–145)

## 2023-06-22 LAB — PHOSPHORUS: Phosphorus: 3.8 mg/dL (ref 2.5–4.6)

## 2023-06-22 LAB — IRON AND TIBC
Iron: 31 ug/dL (ref 28–170)
Saturation Ratios: 11 % (ref 10.4–31.8)
TIBC: 293 ug/dL (ref 250–450)
UIBC: 262 ug/dL

## 2023-06-22 LAB — FOLATE: Folate: 17.8 ng/mL (ref >5.9)

## 2023-06-22 LAB — RETICULOCYTES
Immature Retic Fract: 23 % — ABNORMAL HIGH (ref 2.3–15.9)
RBC.: 4.09 MIL/uL (ref 3.87–5.11)
Retic Count, Absolute: 60.5 10*3/uL (ref 19.0–186.0)
Retic Ct Pct: 1.5 % (ref 0.4–3.1)

## 2023-06-22 LAB — CBC
HCT: 31.9 % — ABNORMAL LOW (ref 36.0–46.0)
Hemoglobin: 10 g/dL — ABNORMAL LOW (ref 12.0–15.0)
MCH: 24.5 pg — ABNORMAL LOW (ref 26.0–34.0)
MCHC: 31.3 g/dL (ref 30.0–36.0)
MCV: 78.2 fL — ABNORMAL LOW (ref 80.0–100.0)
Platelets: 208 10*3/uL (ref 150–400)
RBC: 4.08 MIL/uL (ref 3.87–5.11)
RDW: 16.2 % — ABNORMAL HIGH (ref 11.5–15.5)
WBC: 8.9 10*3/uL (ref 4.0–10.5)
nRBC: 0 % (ref 0.0–0.2)

## 2023-06-22 LAB — GLUCOSE, CAPILLARY
Glucose-Capillary: 139 mg/dL — ABNORMAL HIGH (ref 70–99)
Glucose-Capillary: 165 mg/dL — ABNORMAL HIGH (ref 70–99)
Glucose-Capillary: 139 mg/dL — ABNORMAL HIGH (ref 70–99)
Glucose-Capillary: 154 mg/dL — ABNORMAL HIGH (ref 70–99)

## 2023-06-22 LAB — FERRITIN: Ferritin: 33 ng/mL (ref 11–307)

## 2023-06-22 LAB — MAGNESIUM: Magnesium: 2 mg/dL (ref 1.7–2.4)

## 2023-06-22 LAB — VITAMIN B12: Vitamin B-12: 468 pg/mL (ref 180–914)

## 2023-06-22 NOTE — Plan of Care (Signed)

## 2023-06-22 NOTE — Progress Notes (Signed)
 Progress Note  Patient Name: [name] Date of Encounter: 06/22/2023  Primary Cardiologist: Gaylyn Keas, MD  Subjective   Feeling well currently, no complaints. Reports she felt her heart racing earlier this AM. Telemetry shows NSR throughout, rare PAC. No CP or SOB.  Inpatient Medications    Scheduled Meds:  amiodarone   200 mg Oral BID   apixaban   5 mg Oral BID   Gerhardt's butt cream   Topical BID   hydrALAZINE   10 mg Oral BID   insulin  aspart  0-15 Units Subcutaneous TID WC   insulin  glargine-yfgn  10 Units Subcutaneous Daily   melatonin  3 mg Oral QHS   metoprolol  tartrate  25 mg Oral BID   pantoprazole   40 mg Oral Q supper   spironolactone   12.5 mg Oral Daily   Continuous Infusions:  PRN Meds: acetaminophen , docusate sodium , hydrALAZINE , LORazepam , ondansetron  (ZOFRAN ) IV   Vital Signs    Vitals:   06/21/23 2052 06/21/23 2319 06/22/23 0258 06/22/23 0729  BP: (!) 142/75 (!) 131/59 (!) 120/50 (!) 168/73  Pulse: 74 60 (!) 50 (!) 56  Resp:  20 17 20   Temp:  97.9 F (36.6 C)  98.3 F (36.8 C)  TempSrc:  Oral  Oral  SpO2:  97% 100% 95%  Weight:      Height:        Intake/Output Summary (Last 24 hours) at 06/22/2023 0810 Last data filed at 06/22/2023 0400 Gross per 24 hour  Intake 238 ml  Output 200 ml  Net 38 ml      06/21/2023   10:22 AM 06/18/2023    1:36 PM 05/19/2023   10:52 PM  Last 3 Weights  Weight (lbs) 165 lb 5.5 oz 166 lb 14.2 oz 165 lb 9.1 oz  Weight (kg) 75 kg 75.7 kg 75.1 kg     Telemetry    NSR, rare PAC - Personally Reviewed  Physical Exam   GEN: No acute distress.  HEENT: Normocephalic, atraumatic, sclera non-icteric. Neck: No JVD or bruits. Cardiac: RRR no murmurs, rubs, or gallops.  Respiratory: Mildly decreased BS throughout bilaterally without wheezing, rales or rhonchi. Breathing is unlabored. GI: Soft, nontender, non-distended, BS +x 4. MS: no deformity. Extremities: No clubbing or cyanosis. Mild BLE edema. Distal  pedal pulses are 2+ and equal bilaterally. Neuro:  AAOx3. Follows commands. Psych:  Responds to questions appropriately with a normal affect.   Labs    High Sensitivity Troponin:  No results for input(s): "TROPONINIHS" in the last 720 hours.    Cardiac EnzymesNo results for input(s): "TROPONINI" in the last 168 hours. No results for input(s): "TROPIPOC" in the last 168 hours.   Chemistry Recent Labs  Lab 06/18/23 0415 06/19/23 0902 06/20/23 0424 06/21/23 0320 06/22/23 0320  NA 142   < > 139 139 139  K 3.3*   < > 3.9 4.1 4.0  CL 103   < > 102 101 101  CO2 25   < > 29 30 31   GLUCOSE 162*   < > 138* 141* 142*  BUN 15   < > 16 19 22   CREATININE 0.95   < > 1.09* 1.13* 1.17*  CALCIUM 9.0   < > 8.9 8.5* 8.5*  PROT 6.6  --   --   --   --   ALBUMIN 3.6  --   --   --   --   AST 23  --   --   --   --   ALT  15  --   --   --   --   ALKPHOS 61  --   --   --   --   BILITOT 0.6  --   --   --   --   GFRNONAA 58*   < > 49* 47* 45*  ANIONGAP 14   < > 8 8 7    < > = values in this interval not displayed.     Hematology Recent Labs  Lab 06/19/23 0902 06/20/23 0424 06/22/23 0320  WBC 9.7 10.0 8.9  RBC 4.23 4.30 4.08  4.09  HGB 10.2* 10.4* 10.0*  HCT 33.5* 33.6* 31.9*  MCV 79.2* 78.1* 78.2*  MCH 24.1* 24.2* 24.5*  MCHC 30.4 31.0 31.3  RDW 16.2* 16.2* 16.2*  PLT 202 209 208    BNP Recent Labs  Lab 06/18/23 0552  BNP 309.7*     DDimer  Recent Labs  Lab 06/18/23 1327  DDIMER 4.61*     Radiology    No results found.  Cardiac Studies   Echo 06/18/23   1. Left ventricular ejection fraction, by estimation, is 65 to 70%. The  left ventricle has normal function. The left ventricle has no regional  wall motion abnormalities. There is mild left ventricular hypertrophy.  Left ventricular diastolic parameters  are indeterminate.   2. Right ventricular systolic function is normal. The right ventricular  size is mildly enlarged. There is mildly elevated pulmonary artery   systolic pressure. The estimated right ventricular systolic pressure is  44.0 mmHg.   3. Left atrial size was moderately dilated.   4. Right atrial size was moderately dilated.   5. The mitral valve is degenerative. Mild mitral valve regurgitation. No  evidence of mitral stenosis.   6. Tricuspid valve regurgitation is mild to moderate.   7. The aortic valve is abnormal. There is moderate calcification of the  aortic valve. Aortic valve regurgitation is trivial. No aortic stenosis is  present.   8. Aortic dilatation noted. There is borderline dilatation of the aortic  root and of the ascending aorta, measuring 41 mm.   9. The inferior vena cava is normal in size with greater than 50%  respiratory variability, suggesting right atrial pressure of 3 mmHg.  10. Cannot exclude a small PFO.   Patient Profile     89 y.o. female with chronic HFpEF, paroxsymal atrial fibrillation, coronary artery calcification with elevated troponin 12/2021 in setting of afib (opted for conservative management), elevated troponin 05/2023 felt due to demand ischemia, mitral regurgitation, DM, thoracic aortic aneurysm, probable CKD 3a by labs, anemia. Prior admissions 12/2021, 01/2022, 02/2023, 05/2023 with atrial fib - converted to NSR on diltiazem  drip as of last admission. Lost her husband of 50 years 3 weeks ago (they were in ALF together). Presented to the ED 06/18/23 with unwitnessed fall from ALF - got up to go to the bathroom, doesn't remember much, confused on arrival to the ED. Initially in NSR but developed AF RVR during admission.  Assessment & Plan    1. Unwitnessed fall (mechanical vs syncope) with confusion upon presentation to ED 2. Recurrent atrial fibrillation with RVR, labile blood pressure this admission 3. QT prolongation (resolved) - known history of PAF. Initially in NSR with prolonged QT on presentation then developed recurrent AF in the ED. Treated with IV diltiazem  and converted to sinus rhythm.  Developed recurrent atrial fibrillation with RVR 5/20 and given IV diltiazem  bolus, then BP dropped briefly to 88 systolic. Had previously been  on amiodarone , unclear why this was stopped at prior hospitalization (last on this 02/2023, no longer taking as of 05/2023, daughter indicated stopped by cardiology but I do not see any notes mentioning this), restarted this admission - metoprolol  briefly changed to carvedilol  due to BP then changed back to metoprolol  given soft BP - current rx: amiodarone  200mg  BID, metoprolol  tartrate 25mg  BID, hydralazine  10mg  BID, apixaban  5mg  BID, spironolactone  12.5mg  daily - continue for now - await input from MD whether post dc monitoring is anticipated vs observation for recurrent events  4. Borderline hypoxia - appears this was an issue during previous hospitalization - resolved, ambulatory O2 assessment yesterday normal   5. Acute on chronic HFpEF - may be driven by #2, CXR negative on admission, BNP elevated 309 - s/p IV Lasix  x 3 days (on MWF PTA), transitioned to spironolactone  12.5mg  daily 06/21/23 - pending MD advisement on home Lasix  dose vs PRN use with new spironolactone    6. Microcytic anemia - chronic appearing waxing/waning anemia over the years though 12-13 in 2024, now 10-11 range - iron studies still WNL but low end of normal, suspect would benefit from supplementation, but will defer to primary team - hemoccult ordered (no bleeding reported) - will need OP f/u PCP as well   7. Coronary artery calcifications - prior troponin elevations noted in previous admissions with significant elevation 12/2021 of 3k, opting for conservative management at that time, also elevation to 815 in 05/2023 felt due to demand ischemia - not checked this admission but EKG showed diffuse ST depression suspected rate related - d/w Dr. Veryl Gottron - anticipate conservative strategy with treatment of underlying Afib - no ASA given Eliquis /anemia, already on BB, prior  intolerances to pravastatin  + atorvastatin noted   6. Mild MR, mild-moderate TR, trivial AI, possible small PFO - follow clinically as outpatient   7. Ascending TAA - 4.5cm by CT this admission, no acute intervention needed, can review plan for monitoring as outpatient   8. Hypokalemia, hypomagnesemia, suspected CKD 3a by labs - K, Mg improved with supplementation - follow on new spironolactone    Tentatively has f/u on 6/3 with M. Lenze PA-C.  For questions or updates, please contact [facility] Please consult [email or web address] for contact info under Cardiology/STEMI.  Signed, Manuel Lawhead N Elray Dains, PA-C 06/22/2023, 8:10 AM

## 2023-06-22 NOTE — TOC Progression Note (Signed)
 Transition of Care [facility]) - Progression Note    Patient Details  Name: [name] MRN: [id number] Date of Birth: 09-Apr-1932  [facility] CM/SW Contact  Valery Gaucher, [place] Phone Number: 06/22/2023, 10:08 AM  Clinical Narrative:     CSW contacted Valinda Gault w/[name]- informed anticipated d/c tomorrow. She states she has visited with the patient and does not anticipate any concerns at discharge. Valinda Gault advised TOC will need to contact their Health & Wellness Director Rowena at [phone] once stable for discharge.   TOC will continue to follow and assist with discharge planning.  Liddie Reel, MSW, LCSW Clinical Social Worker    Expected Discharge Plan: Assisted Living Barriers to Discharge: Continued Medical Work up  Expected Discharge Plan and Services In-house Referral: Clinical Social Work   Post Acute Care Choice: Resumption of Svcs/PTA Provider, Home Health Living arrangements for the past 2 months: Assisted Living Facility                                       Social Determinants of Health (SDOH) Interventions SDOH Screenings   Food Insecurity: No Food Insecurity (06/18/2023)  Housing: Low Risk  (06/18/2023)  Transportation Needs: No Transportation Needs (06/18/2023)  Utilities: Not At Risk (06/18/2023)  Social Connections: Moderately Isolated (06/18/2023)  Tobacco Use: Low Risk  (06/18/2023)    Readmission Risk Interventions     No data to display

## 2023-06-22 NOTE — Progress Notes (Signed)
 PROGRESS NOTE    [name]  YNW:[id number] DOB: 1932-10-02 DOA: 06/18/2023 [facility]CP: [facility]   Brief Narrative:  This 89 year old female with PMH significant of CAD, AAA, status post coiling of cerebral artery aneurysm, A-fib, hypertension, hyperlipidemia, diabetes who presented with unwitnessed fall, She was found to be in A-fib with RVR.  Patient is a poor historian.  She remember going to the restroom and then waking up in the hospital.  She was admitted for further evaluation.  Cardiology is consulted.  Assessment & Plan:   Principal Problem:   Atrial fibrillation with rapid ventricular response (HCC) Active Problems:   Paroxysmal atrial fibrillation (HCC)   Acute on chronic diastolic heart failure (HCC)   A-fib with RVR: Patient presented with unwitnessed fall and found to be in A-fib with RVR. She has missed a.m. dose of metoprolol  prior to the admission. Per Daughter Amiodarone  was stopped by cardiology some time ago. Unclear documentation. CT head: No acute intracranial abnormalities. Large dorsal right parieto-occipital scalp hematoma.  Chest x-ray, urine culture > unremarkable. PT OT evaluation pending. Started Coreg  6.25 twice daily She was started on IV diltiazem , which was discontinued on 5/18. TTE reveals EF 65 to 70%, mild LVH, left atrial dilatation. Patient went back into A.fib with RVR on 06/20/2023. Cardiology consulted.  Continue amiodarone  200 mg twice daily, metoprolol  25 mg twice daily, hydralazine  10 mg twice daily and Eliquis  5 mg twice daily.  Telemetry shows NSR throughout.   Presumed syncope: Orthostatic BP Positive. Continue TED hose.  Changed hydralazine  from TID to BID   Acute on chronic Diastolic Dysfunction: Per daughter patient has been retaining more fluids.  BNP slightly elevated 309 She is on lasix  3 times week or as needed.  On Spironolactone  Three times a week.  Started on IV lasix . Dose reduced to 20 mg daily due to  increase in creatinine.    Ground-level fall: PT/OT consulted and recommended HHPT   Right posterior scalp laceration: Due to fall..  Continue local care.    Hypokalemia, hypomagnesemia - Replete as needed.    Diabetes type 2, controlled" Last A1c (05/19/2023) was 6.6. Continue SSI Continue Semglee  10 units daily.    Pyuria; Urine culture no growth to date.   Estimated body mass index is 28.65 kg/m as calculated from the following:   Height as of this encounter: 5\' 4"  (1.626 m).   Weight as of this encounter: 75.7 kg.     DVT prophylaxis: Eliquis  Code Status: Full code Family Communication:  No family at bed side Disposition Plan:  Status is: Inpatient Remains inpatient appropriate because: Admitted for A-fib with RVR.  Cardiology is consulted.    Consultants:  Cardiology  Procedures:  Antimicrobials:  Anti-infectives (From admission, onward)    None      Subjective: Patient was seen and examined at bedside.  Overnight events noted. Patient reports she is feeling better, reports heart racing in the morning but telemetry shows normal sinus rhythm. She denies any pain, Shortness of breath.   Objective: Vitals:   06/21/23 2319 06/22/23 0258 06/22/23 0729 06/22/23 1152  BP: (!) 131/59 (!) 120/50 (!) 168/73 (!) 186/79  Pulse: 60 (!) 50 (!) 56 (!) 55  Resp: 20 17 20 20   Temp: 97.9 F (36.6 C)  98.3 F (36.8 C) 97.7 F (36.5 C)  TempSrc: Oral  Oral Oral  SpO2: 97% 100% 95% 94%  Weight:      Height:        Intake/Output Summary (  Last 24 hours) at 06/22/2023 1216 Last data filed at 06/22/2023 1153 Gross per 24 hour  Intake 598 ml  Output 500 ml  Net 98 ml   Filed Weights   06/18/23 1336 06/21/23 1022  Weight: 75.7 kg 75 kg    Examination:  General exam: Appears calm and comfortable, not in any acute distress. Respiratory system: CTA Bilaterally. Respiratory effort normal.  RR 14 Cardiovascular system: S1 & S2 heard, RRR. No JVD, murmurs, rubs,  gallops or clicks. Gastrointestinal system: Abdomen is non distended, soft and non tender.  Normal bowel sounds heard. Central nervous system: Alert and oriented x 3. No focal neurological deficits. Extremities: Edema+, no cyanosis, no clubbing Skin: No rashes, lesions or ulcers Psychiatry: Judgement and insight appear normal. Mood & affect appropriate.     Data Reviewed: I have personally reviewed following labs and imaging studies  CBC: Recent Labs  Lab 06/18/23 0415 06/19/23 0902 06/20/23 0424 06/22/23 0320  WBC 12.0* 9.7 10.0 8.9  NEUTROABS 5.6  --   --   --   HGB 11.3* 10.2* 10.4* 10.0*  HCT 36.7 33.5* 33.6* 31.9*  MCV 79.3* 79.2* 78.1* 78.2*  PLT 202 202 209 208   Basic Metabolic Panel: Recent Labs  Lab 06/18/23 0415 06/19/23 0902 06/20/23 0424 06/21/23 0320 06/22/23 0320  NA 142 141 139 139 139  K 3.3* 4.9 3.9 4.1 4.0  CL 103 106 102 101 101  CO2 25 27 29 30 31   GLUCOSE 162* 120* 138* 141* 142*  BUN 15 13 16 19 22   CREATININE 0.95 0.99 1.09* 1.13* 1.17*  CALCIUM 9.0 8.9 8.9 8.5* 8.5*  MG 1.6* 2.1  --  1.7 2.0  PHOS  --   --   --   --  3.8   GFR: Estimated Creatinine Clearance: 33 mL/min (A) (by C-G formula based on SCr of 1.17 mg/dL (H)). Liver Function Tests: Recent Labs  Lab 06/18/23 0415  AST 23  ALT 15  ALKPHOS 61  BILITOT 0.6  PROT 6.6  ALBUMIN 3.6   No results for input(s): "LIPASE", "AMYLASE" in the last 168 hours. No results for input(s): "AMMONIA" in the last 168 hours. Coagulation Profile: No results for input(s): "INR", "PROTIME" in the last 168 hours. Cardiac Enzymes: No results for input(s): "CKTOTAL", "CKMB", "CKMBINDEX", "TROPONINI" in the last 168 hours. BNP (last 3 results) No results for input(s): "PROBNP" in the last 8760 hours. HbA1C: No results for input(s): "HGBA1C" in the last 72 hours. CBG: Recent Labs  Lab 06/21/23 1112 06/21/23 1553 06/21/23 2135 06/22/23 0604 06/22/23 1150  GLUCAP 226* 136* 178* 139* 165*    Lipid Profile: No results for input(s): "CHOL", "HDL", "LDLCALC", "TRIG", "CHOLHDL", "LDLDIRECT" in the last 72 hours. Thyroid  Function Tests: Recent Labs    06/21/23 0319  TSH 1.313   Anemia Panel: Recent Labs    06/22/23 0320  VITAMINB12 468  FOLATE 17.8  FERRITIN 33  TIBC 293  IRON 31  RETICCTPCT 1.5   Sepsis Labs: No results for input(s): "PROCALCITON", "LATICACIDVEN" in the last 168 hours.  Recent Results (from the past 240 hours)  Urine Culture     Status: None   Collection Time: 06/18/23  7:35 AM   Specimen: Urine, Clean Catch  Result Value Ref Range Status   Specimen Description URINE, CLEAN CATCH  Final   Special Requests NONE  Final   Culture   Final    NO GROWTH Performed at [facility], [address] [place],  [place] [address]    Report Status 06/19/2023 FINAL  Final    Radiology Studies: No results found.  Scheduled Meds:  amiodarone   200 mg Oral BID   apixaban   5 mg Oral BID   Gerhardt's butt cream   Topical BID   hydrALAZINE   10 mg Oral BID   insulin  aspart  0-15 Units Subcutaneous TID WC   insulin  glargine-yfgn  10 Units Subcutaneous Daily   melatonin  3 mg Oral QHS   metoprolol  tartrate  25 mg Oral BID   pantoprazole   40 mg Oral Q supper   spironolactone   12.5 mg Oral Daily   Continuous Infusions:     LOS: 4 days    Time spent: 35 mins    Magdalene School, MD [facility]   If 7PM-7AM, please contact night-coverage

## 2023-06-22 NOTE — Progress Notes (Signed)
 Cardiology Office Note:  .   Date:  07/04/2023  ID:  [name], DOB 12/10/1932, MRN [id number] [facility]CP: [facility]  [facility] Providers Cardiologist:  Gaylyn Keas, MD    History of Present Illness: .   [name] is a 89 y.o. female with chronic HFpEF, paroxsymal atrial fibrillation, coronary artery calcification with elevated troponin 12/2021 in setting of afib (opted for conservative management), elevated troponin 05/2023 felt due to demand ischemia, mitral regurgitation, DM, thoracic aortic aneurysm, probable CKD 3a by labs, anemia. Prior admissions 12/2021, 01/2022, 02/2023, 05/2023 with atrial fib - converted to NSR on diltiazem  drip as of last admission. Lost her husband of 50 years 3 weeks ago (they were in ALF together). Presented to the ED 06/18/23 with unwitnessed fall from ALF - got up to go to the bathroom, doesn't remember much, confused on arrival to the ED. Initially in NSR but developed AF RVR during admission. Amiodarone  had been stopped-unclear reason and restarted. Given one dose of lasix  and euvolemic. Discharged 06/23/23. Lost her husband 4 weeks ago.  Patient comes in alone from [place]. She's weak as she didn't have breakfast and is a diabetic. Doing PT twice a week. Hasn't felt anymore Afib. Some swelling in her legs-says it's no worse since the hospital.      ROS:    Studies Reviewed: Aaron Aas         Prior CV Studies:    Echo 06/18/23   1. Left ventricular ejection fraction, by estimation, is 65 to 70%. The  left ventricle has normal function. The left ventricle has no regional  wall motion abnormalities. There is mild left ventricular hypertrophy.  Left ventricular diastolic parameters  are indeterminate.   2. Right ventricular systolic function is normal. The right ventricular  size is mildly enlarged. There is mildly elevated pulmonary artery  systolic pressure. The estimated right ventricular systolic pressure is  44.0 mmHg.   3. Left  atrial size was moderately dilated.   4. Right atrial size was moderately dilated.   5. The mitral valve is degenerative. Mild mitral valve regurgitation. No  evidence of mitral stenosis.   6. Tricuspid valve regurgitation is mild to moderate.   7. The aortic valve is abnormal. There is moderate calcification of the  aortic valve. Aortic valve regurgitation is trivial. No aortic stenosis is  present.   8. Aortic dilatation noted. There is borderline dilatation of the aortic  root and of the ascending aorta, measuring 41 mm.   9. The inferior vena cava is normal in size with greater than 50%  respiratory variability, suggesting right atrial pressure of 3 mmHg.  10. Cannot exclude a small PFO.     Risk Assessment/Calculations:    CHA2DS2-VASc Score = 7   This indicates a 11.2% annual risk of stroke. The patient's score is based upon: CHF History: 1 HTN History: 1 Diabetes History: 1 Stroke History: 0 Vascular Disease History: 1 Age Score: 2 Gender Score: 1    HYPERTENSION CONTROL Vitals:   07/04/23 0836 07/04/23 0924  BP: (!) 155/56 (!) 140/68    The patient's blood pressure is elevated above target today.  In order to address the patient's elevated BP: Blood pressure will be monitored at home to determine if medication changes need to be made.; A current anti-hypertensive medication was adjusted today.          Physical Exam:   VS:  BP (!) 140/68   Pulse (!) 49   Ht  5\' 4"  (1.626 m)   SpO2 98%   BMI 28.04 kg/m    Wt Readings from Last 3 Encounters:  06/23/23 163 lb 5.8 oz (74.1 kg)  05/19/23 165 lb 9.1 oz (75.1 kg)  02/14/23 153 lb (69.4 kg)    GEN: Well nourished, well developed in no acute distress NECK: No JVD; No carotid bruits CARDIAC:  RRR, 2/6 systolic murmur LSB RESPIRATORY:  Clear to auscultation without rales, wheezing or rhonchi  ABDOMEN: Soft, non-tender, non-distended EXTREMITIES:  2-3  edema; No deformity   ASSESSMENT AND PLAN: .    Recurrent  Afib with RVR/QT prolongations converted to NSR - current rx: amiodarone  200mg  BID-reduce to 200 mg once daily, metoprolol  tartrate 25mg  BID,  apixaban  5mg  BID,       Acute on Chronic HFpEF-with increased edema-give lasix  40 mg for 2 days then back to regular dosing, 2 gm sodium diet  HTN-BP running high. Increase hydralazine  10 mg tid. Many drug intolerances.   Coronary artery calcifications - prior troponin elevations noted in previous admissions with significant elevation 12/2021 of 3k, opting for conservative management at that time, also elevation to 815 in 05/2023 felt due to demand ischemia - not checked this admission but EKG showed diffuse ST depression suspected rate related - d/w Dr. Veryl Gottron - anticipate conservative strategy with treatment of underlying Afib - no ASA given Eliquis /anemia, already on BB, prior intolerances to pravastatin  + atorvastatin noted  Mild MR, mild-moderate TR, trivial AI, possible small PFO - follow clinically as outpatient   Ascending TAA - 4.5cm by CT  no acute intervention needed, can review plan for monitoring as outpatient           Dispo: f/u Dr. Micael Adas 2 months  Signed, Theotis Flake, PA-C

## 2023-06-23 DIAGNOSIS — I4891 Unspecified atrial fibrillation: Secondary | ICD-10-CM | POA: Diagnosis not present

## 2023-06-23 LAB — BASIC METABOLIC PANEL WITH GFR
Anion gap: 8 (ref 5–15)
BUN: 22 mg/dL (ref 8–23)
CO2: 31 mmol/L (ref 22–32)
Calcium: 8.8 mg/dL — ABNORMAL LOW (ref 8.9–10.3)
Chloride: 100 mmol/L (ref 98–111)
Creatinine, Ser: 1.08 mg/dL — ABNORMAL HIGH (ref 0.44–1.00)
GFR, Estimated: 49 mL/min — ABNORMAL LOW (ref >60)
Glucose, Bld: 175 mg/dL — ABNORMAL HIGH (ref 70–99)
Potassium: 4.7 mmol/L (ref 3.5–5.1)
Sodium: 139 mmol/L (ref 135–145)

## 2023-06-23 LAB — GLUCOSE, CAPILLARY
Glucose-Capillary: 155 mg/dL — ABNORMAL HIGH (ref 70–99)
Glucose-Capillary: 190 mg/dL — ABNORMAL HIGH (ref 70–99)
Glucose-Capillary: 116 mg/dL — ABNORMAL HIGH (ref 70–99)
Glucose-Capillary: 137 mg/dL — ABNORMAL HIGH (ref 70–99)

## 2023-06-23 LAB — CBC
HCT: 33.8 % — ABNORMAL LOW (ref 36.0–46.0)
Hemoglobin: 10.7 g/dL — ABNORMAL LOW (ref 12.0–15.0)
MCH: 24.9 pg — ABNORMAL LOW (ref 26.0–34.0)
MCHC: 31.7 g/dL (ref 30.0–36.0)
MCV: 78.8 fL — ABNORMAL LOW (ref 80.0–100.0)
Platelets: 235 10*3/uL (ref 150–400)
RBC: 4.29 MIL/uL (ref 3.87–5.11)
RDW: 16.3 % — ABNORMAL HIGH (ref 11.5–15.5)
WBC: 9.7 10*3/uL (ref 4.0–10.5)
nRBC: 0 % (ref 0.0–0.2)

## 2023-06-23 LAB — MAGNESIUM: Magnesium: 1.9 mg/dL (ref 1.7–2.4)

## 2023-06-23 MED ORDER — ALUM & MAG HYDROXIDE-SIMETH 200-200-20 MG/5ML PO SUSP
30.0000 mL | Freq: Once | ORAL | Status: AC
  Administered 2023-06-23: 30 mL via ORAL
  Filled 2023-06-23: qty 30

## 2023-06-23 MED ORDER — HYDRALAZINE HCL 10 MG PO TABS: 10.0000 mg | ORAL_TABLET | Freq: Two times a day (BID) | ORAL | 0 refills | Status: DC

## 2023-06-23 MED ORDER — ALUMINUM & MAGNESIUM HYDROXIDE 200-200 MG/5ML PO SUSP: 15.0000 mL | ORAL | 0 refills | Status: AC | PRN

## 2023-06-23 MED ORDER — AMIODARONE HCL 200 MG PO TABS: 200.0000 mg | ORAL_TABLET | Freq: Two times a day (BID) | ORAL | 0 refills | Status: DC

## 2023-06-23 NOTE — Discharge Summary (Signed)
 Physician Discharge Summary  IDALI LAFEVER ZOX:[id number] DOB: 1932/06/25 DOA: 06/18/2023  [facility]P: [facility]  Admit date: 06/18/2023  Discharge date: 06/23/2023  Admitted From: Home  Disposition:  Home Health Services  Recommendations for Outpatient Follow-up:  Follow up with PCP in 1-2 weeks. Please obtain BMP/CBC in one week. Advised to follow-up with Cardiology as scheduled. Advised to take amiodarone  200 mg twice daily for A-fib.  Home Health: None Equipment/Devices:None  Discharge Condition: Stable CODE STATUS:Full code Diet recommendation: Heart Healthy   Brief [facility] Course: This 89 year old female with PMH significant of CAD, AAA, status post coiling of cerebral artery aneurysm, A-fib, hypertension, hyperlipidemia, diabetes who presented with unwitnessed fall, She was found to be in A-fib with RVR. Patient is a poor historian. She remember going to the restroom and then waking up in the hospital. She was admitted for further evaluation.Cardiology was consulted.  Workup so far unremarkable.  CT head negative for acute abnormality.  Patient was started on Cardizem  IV and she converted to normal sinus rhythm.  TTE showed LVEF 65 to 70%, mild LVH, left atrial dilatation. Medications adjusted as per cardiology. Patient has made significant improvement and feels better, She wants to be discharged. Cardiology signed off,  Patient will follow-up outpatient.  Discharge Diagnoses:  Principal Problem:   Atrial fibrillation with rapid ventricular response (HCC) Active Problems:   Paroxysmal atrial fibrillation (HCC)   Acute on chronic diastolic heart failure (HCC)   Primary hypertension  A-fib with RVR: Patient presented with unwitnessed fall and found to be in A-fib with RVR. She has missed a.m. dose of metoprolol  prior to the admission. Per Daughter Amiodarone  was stopped by cardiology some time ago. Unclear documentation. CT head: No acute intracranial  abnormalities. Large dorsal right parieto-occipital scalp hematoma.  Chest x-ray, urine culture > unremarkable. PT OT recommended home health services. Started Coreg  6.25 twice daily She was started on IV diltiazem , which was discontinued on 5/18. TTE reveals EF 65 to 70%, mild LVH, left atrial dilatation. Patient went back into A.fib with RVR on 06/20/2023. Cardiology consulted.  Continue amiodarone  200 mg twice daily, metoprolol  25 mg twice daily, hydralazine  10 mg twice daily and Eliquis  5 mg twice daily.  Telemetry shows NSR throughout. Cardiology signed off.   Presumed syncope: Orthostatic BP Positive. Continue TED hose.  Changed hydralazine  from TID to BID   Acute on chronic Diastolic Dysfunction: Per daughter patient has been retaining more fluids.  BNP slightly elevated 309 She is on lasix  3 times week or as needed.  On Spironolactone  Three times a week.  Started on IV lasix . Dose reduced to 20 mg daily due to increase in creatinine.    Ground-level fall: PT/OT consulted and recommended HHPT   Right posterior scalp laceration: Due to fall..  Continue local care.    Hypokalemia, hypomagnesemia - Replete as needed.    Diabetes type 2, controlled" Last A1c (05/19/2023) was 6.6. Continue SSI Continue Semglee  10 units daily.    Pyuria; Urine culture no growth to date.   Estimated body mass index is 28.65 kg/m as calculated from the following:   Height as of this encounter: 5\' 4"  (1.626 m).   Weight as of this encounter: 75.7 kg.    Discharge Instructions  Discharge Instructions     Call MD for:  difficulty breathing, headache or visual disturbances   Complete by: As directed    Call MD for:  persistant dizziness or light-headedness   Complete by: As directed  Call MD for:  persistant nausea and vomiting   Complete by: As directed    Diet - low sodium heart healthy   Complete by: As directed    Diet Carb Modified   Complete by: As directed    Discharge  instructions   Complete by: As directed    Advised to follow-up with primary care physician in 1 week. Advised to follow-up with cardiology as scheduled. Advised to take amiodarone  200 mg twice daily for A-fib.   Discharge wound care:   Complete by: As directed    Follow-up PCP as scheduled.   Increase activity slowly   Complete by: As directed       Allergies as of 06/23/2023       Reactions   Actos [pioglitazone] Other (See Comments)   Chronic pedal edema   Amoxil  [amoxicillin ] Other (See Comments)   Stomach upset   Avandia [rosiglitazone] Other (See Comments)   Chronic pedal edema   Fosamax [alendronate Sodium] Other (See Comments)   Unknown reaction   Ms Contin  [morphine ] Other (See Comments)   Body spasms    Ultram  [tramadol ] Shortness Of Breath, Palpitations   Amaryl [glimepiride] Other (See Comments)   Unknown reaction   Januvia [sitagliptin] Other (See Comments)   Unknown reaction   Lipitor [atorvastatin] Swelling   Myalgias    Metformin And Related Other (See Comments)   Gastritis    Prandin [repaglinide] Other (See Comments)   Abdominal pain   Tradjenta [linagliptin] Other (See Comments)   GI issues   Zoloft [sertraline Hcl] Other (See Comments)   Altered mental state   Asa [aspirin ] Other (See Comments)   Bleeding ulcers    Bactrim [sulfamethoxazole-trimethoprim] Hives   Bentyl [dicyclomine Hcl] Other (See Comments)   Near syncope Weakness    Cipro  [ciprofloxacin  Hcl] Nausea And Vomiting, Other (See Comments)   Stomach upset   Hctz [hydrochlorothiazide ] Other (See Comments)   Urinary issues   Keflex [cephalexin] Other (See Comments)   Unknown reaction   Morphine  And Codeine Other (See Comments)   Body spasms   Norvasc  [amlodipine  Besylate] Other (See Comments)   Weakness Dizziness    Oxycontin [oxycodone] Other (See Comments)   Unknown reaction   Sulfa Antibiotics Hives   Penicillins Swelling, Rash        Medication List     TAKE these  medications    acetaminophen  500 MG tablet Commonly known as: TYLENOL  Take 500 mg by mouth every 6 (six) hours as needed for mild pain, moderate pain, fever or headache.   aluminum-magnesium  hydroxide 200-200 MG/5ML suspension Take 15 mLs by mouth as needed for indigestion.   amiodarone  200 MG tablet Commonly known as: PACERONE  Take 1 tablet (200 mg total) by mouth 2 (two) times daily.   apixaban  5 MG Tabs tablet Commonly known as: Eliquis  Take 1 tablet (5 mg total) by mouth 2 (two) times daily.   docusate sodium  100 MG capsule Commonly known as: COLACE Take 100 mg by mouth daily as needed for moderate constipation.   furosemide  20 MG tablet Commonly known as: LASIX  Take one tab every Mon, Wed, Fri. Take one additional tab if weight gain of 3+ lbs overnight   hydrALAZINE  25 MG tablet Commonly known as: APRESOLINE  Take 25 mg by mouth as needed (HTN). Give 1 tablet by mouth daily as needed for SBP >170.  Repeat BP in one hour after administration and notify Marijean Shouts, NP if BP remains >170 What changed: Another medication with the same  name was changed. Make sure you understand how and when to take each.   hydrALAZINE  10 MG tablet Commonly known as: APRESOLINE  Take 1 tablet (10 mg total) by mouth 2 (two) times daily. What changed: when to take this   insulin  glargine 100 UNIT/ML Solostar Pen Commonly known as: LANTUS  Inject 18 Units into the skin in the morning.   LORazepam  0.5 MG tablet Commonly known as: ATIVAN  Take 0.5 mg by mouth every 8 (eight) hours as needed for anxiety.   melatonin 3 MG Tabs tablet Take 3 mg by mouth at bedtime.   metoprolol  tartrate 25 MG tablet Commonly known as: LOPRESSOR  Take 1 tablet (25 mg total) by mouth 2 (two) times daily. What changed: additional instructions   multivitamin tablet Take 1 tablet by mouth in the morning.   ondansetron  4 MG tablet Commonly known as: ZOFRAN  Take 1 tablet (4 mg total) by mouth every 8 (eight)  hours as needed for nausea or vomiting.   pantoprazole  40 MG tablet Commonly known as: PROTONIX  TAKE 1 TABLET BY MOUTH EVERY DAY BEFORE BREAKFAST What changed: See the new instructions.   polyethylene glycol 17 g packet Commonly known as: MIRALAX  / GLYCOLAX  Take 17 g by mouth daily as needed for moderate constipation.   spironolactone  25 MG tablet Commonly known as: ALDACTONE  Take 0.5 tablets (12.5 mg total) by mouth daily. What changed:  when to take this additional instructions               Discharge Care Instructions  (From admission, onward)           Start     Ordered   06/23/23 0000  Discharge wound care:       Comments: Follow-up PCP as scheduled.   06/23/23 1012            Follow-up Information     Flo Hummingbird, PA-C Follow up.   Specialty: Cardiology Why: [facility] - [address] office (new building) - cardiology follow-up has been moved to Tuesday July 04, 2023 at 8:15 AM (Arrive by 8:00 AM). Ludwig Safer is one of our PAs that works with Dr. Charl Concha team. Contact information: [address] [place] [address] [phone]         [facility] [place], [place] Follow up.   Why: ([facility]) HHPTOT- services to resume Contact information: [address] [place] [place] [address] [phone]         [facility] Follow up in 1 week(s).   Contact information: [address] [place] [address] [phone]                Allergies  Allergen Reactions   Actos [Pioglitazone] Other (See Comments)    Chronic pedal edema   Amoxil  [Amoxicillin ] Other (See Comments)    Stomach upset   Avandia [Rosiglitazone] Other (See Comments)    Chronic pedal edema   Fosamax [Alendronate Sodium] Other (See Comments)    Unknown reaction   Ms Contin  [Morphine ] Other (See Comments)    Body spasms    Ultram  [Tramadol ] Shortness Of Breath and Palpitations   Amaryl  [Glimepiride] Other (See Comments)    Unknown reaction   Januvia [Sitagliptin] Other (See Comments)    Unknown reaction   Lipitor [Atorvastatin] Swelling    Myalgias    Metformin And Related Other (See Comments)    Gastritis    Prandin [Repaglinide] Other (See Comments)    Abdominal pain  Tradjenta [Linagliptin] Other (See Comments)    GI issues   Zoloft [Sertraline Hcl] Other (See Comments)    Altered mental state   Asa [Aspirin ] Other (See Comments)    Bleeding ulcers    Bactrim [Sulfamethoxazole-Trimethoprim] Hives   Bentyl [Dicyclomine Hcl] Other (See Comments)    Near syncope Weakness    Cipro  [Ciprofloxacin  Hcl] Nausea And Vomiting and Other (See Comments)    Stomach upset   Hctz [Hydrochlorothiazide ] Other (See Comments)    Urinary issues   Keflex [Cephalexin] Other (See Comments)    Unknown reaction   Morphine  And Codeine Other (See Comments)    Body spasms   Norvasc  [Amlodipine  Besylate] Other (See Comments)    Weakness Dizziness    Oxycontin [Oxycodone] Other (See Comments)    Unknown reaction   Sulfa Antibiotics Hives   Penicillins Swelling and Rash    Consultations: Cardiology   Procedures/Studies: ECHOCARDIOGRAM COMPLETE Result Date: 06/19/2023    ECHOCARDIOGRAM REPORT   Patient Name:   [name] Date of Exam: 06/18/2023 Medical Rec #:  [id number]       Height:       64.0 in Accession #:    [id number]      Weight:       166.9 lb Date of Birth:  06/26/1932       BSA:          1.812 m Patient Age:    89 years        BP:           120/57 mmHg Patient Gender: F               HR:           113 bpm. Exam Location:  Inpatient Procedure: 2D Echo (Both Spectral and Color Flow Doppler were utilized during            procedure). Indications:    syncope  History:        Patient has prior history of Echocardiogram examinations, most                 recent 01/03/2022. CAD and ascending aortic aneurysm,                 Arrythmias:Atrial Fibrillation; Risk  Factors:Hypertension,                 Dyslipidemia and Diabetes.  Sonographer:    Dione Franks RDCS Referring Phys: [id number] Arne Langdon IMPRESSIONS  1. Left ventricular ejection fraction, by estimation, is 65 to 70%. The left ventricle has normal function. The left ventricle has no regional wall motion abnormalities. There is mild left ventricular hypertrophy. Left ventricular diastolic parameters are indeterminate.  2. Right ventricular systolic function is normal. The right ventricular size is mildly enlarged. There is mildly elevated pulmonary artery systolic pressure. The estimated right ventricular systolic pressure is 44.0 mmHg.  3. Left atrial size was moderately dilated.  4. Right atrial size was moderately dilated.  5. The mitral valve is degenerative. Mild mitral valve regurgitation. No evidence of mitral stenosis.  6. Tricuspid valve regurgitation is mild to moderate.  7. The aortic valve is abnormal. There is moderate calcification of the aortic valve. Aortic valve regurgitation is trivial. No aortic stenosis is present.  8. Aortic dilatation noted. There is borderline dilatation of the aortic root and of the ascending aorta, measuring 41 mm.  9. The inferior vena cava is normal in size with greater than 50% respiratory variability,  suggesting right atrial pressure of 3 mmHg. 10. Cannot exclude a small PFO. FINDINGS  Left Ventricle: Left ventricular ejection fraction, by estimation, is 65 to 70%. The left ventricle has normal function. The left ventricle has no regional wall motion abnormalities. Definity  contrast agent was given IV to delineate the left ventricular  endocardial borders. The left ventricular internal cavity size was normal in size. There is mild left ventricular hypertrophy. Left ventricular diastolic parameters are indeterminate. Right Ventricle: The right ventricular size is mildly enlarged. No increase in right ventricular wall thickness. Right ventricular systolic function is  normal. There is mildly elevated pulmonary artery systolic pressure. The tricuspid regurgitant velocity is 3.20 m/s, and with an assumed right atrial pressure of 3 mmHg, the estimated right ventricular systolic pressure is 44.0 mmHg. Left Atrium: Left atrial size was moderately dilated. Right Atrium: Right atrial size was moderately dilated. Pericardium: Trivial pericardial effusion is present. Mitral Valve: The mitral valve is degenerative in appearance. Mild mitral valve regurgitation. No evidence of mitral valve stenosis. Tricuspid Valve: The tricuspid valve is grossly normal. Tricuspid valve regurgitation is mild to moderate. No evidence of tricuspid stenosis. Aortic Valve: The aortic valve is abnormal. There is moderate calcification of the aortic valve. Aortic valve regurgitation is trivial. No aortic stenosis is present. Pulmonic Valve: The pulmonic valve was normal in structure. Pulmonic valve regurgitation is trivial. No evidence of pulmonic stenosis. Aorta: Aortic dilatation noted. There is borderline dilatation of the aortic root and of the ascending aorta, measuring 41 mm. Venous: The inferior vena cava is normal in size with greater than 50% respiratory variability, suggesting right atrial pressure of 3 mmHg. IAS/Shunts: Cannot exclude a small PFO.  LEFT VENTRICLE PLAX 2D LVIDd:         4.30 cm LVIDs:         2.80 cm LV PW:         1.10 cm LV IVS:        1.30 cm LVOT diam:     2.00 cm LV SV:         49 LV SV Index:   27 LVOT Area:     3.14 cm  RIGHT VENTRICLE          IVC RV Basal diam:  2.80 cm  IVC diam: 1.80 cm TAPSE (M-mode): 1.6 cm LEFT ATRIUM             Index        RIGHT ATRIUM           Index LA diam:        3.90 cm 2.15 cm/m   RA Area:     19.00 cm LA Vol (A2C):   77.5 ml 42.78 ml/m  RA Volume:   52.00 ml  28.71 ml/m LA Vol (A4C):   54.2 ml 29.92 ml/m LA Biplane Vol: 68.8 ml 37.98 ml/m  AORTIC VALVE LVOT Vmax:   100.00 cm/s LVOT Vmean:  67.000 cm/s LVOT VTI:    0.155 m  AORTA Ao Root  diam: 4.10 cm Ao Asc diam:  4.00 cm TRICUSPID VALVE TR Peak grad:   41.0 mmHg TR Vmax:        320.00 cm/s  SHUNTS Systemic VTI:  0.16 m Systemic Diam: 2.00 cm Grady Lawman MD Electronically signed by Grady Lawman MD Signature Date/Time: 06/19/2023/10:03:48 PM    Final    CT Angio Chest Pulmonary Embolism (PE) W or WO Contrast Result Date: 06/18/2023 CLINICAL DATA:  Recent fall with chest pain, initial  encounter EXAM: CT ANGIOGRAPHY CHEST WITH CONTRAST TECHNIQUE: Multidetector CT imaging of the chest was performed using the standard protocol during bolus administration of intravenous contrast. Multiplanar CT image reconstructions and MIPs were obtained to evaluate the vascular anatomy. RADIATION DOSE REDUCTION: This exam was performed according to the departmental dose-optimization program which includes automated exposure control, adjustment of the mA and/or kV according to patient size and/or use of iterative reconstruction technique. CONTRAST:  75mL OMNIPAQUE  IOHEXOL  350 MG/ML SOLN COMPARISON:  Chest x-ray from earlier in the same day, CT from 05/19/2023 FINDINGS: Cardiovascular: Atherosclerotic calcifications of the thoracic aorta are noted. The ascending aorta measures 4.5 cm in greatest dimension. No dissection is seen. The pulmonary artery shows a normal branching pattern bilaterally. No pulmonary embolus is seen. Heart is mildly enlarged in size. Mild coronary calcifications are noted. Mediastinum/Nodes: Thoracic inlet is within normal limits. No hilar or mediastinal adenopathy is noted. The esophagus as visualized is within normal limits. Lungs/Pleura: Mild emphysematous changes are noted in the lungs bilaterally. No focal infiltrate or effusion is seen. No parenchymal nodules are noted. Upper Abdomen: No acute abnormality. Musculoskeletal: Degenerative changes of the thoracic spine are seen. No acute bony abnormality is noted. Review of the MIP images confirms the above findings. IMPRESSION: No  evidence of pulmonary embolism. Dilatation of the ascending aorta to 4.5 cm. Ascending thoracic aortic aneurysm. Recommend semi-annual imaging followup by CTA or MRA and referral to cardiothoracic surgery if not already obtained. This recommendation follows 2010 ACCF/AHA/AATS/ACR/ASA/SCA/SCAI/SIR/STS/SVM Guidelines for the Diagnosis and Management of Patients With Thoracic Aortic Disease. Circulation. 2010; 121: W098-J191. Aortic aneurysm NOS (ICD10-I71.9) No other focal abnormality is noted. Aortic Atherosclerosis (ICD10-I70.0) and Emphysema (ICD10-J43.9). Electronically Signed   By: Violeta Grey M.D.   On: 06/18/2023 23:48   DG Chest Portable 1 View Result Date: 06/18/2023 CLINICAL DATA:  Syncopal episode. EXAM: PORTABLE CHEST 1 VIEW COMPARISON:  Portable chest 05/19/2023. FINDINGS: The lungs are clear with mild chronic elevation of the right diaphragm. The inferior left CP sulcus was excluded from the exam. The heart is enlarged but unchanged. No vascular congestion is seen. Stable mediastinum with aortic tortuosity and calcification. There is a tangle of overlying monitor wiring. No new osseous findings. Osteopenia. IMPRESSION: No evidence of acute chest disease. Stable cardiomegaly. Aortic atherosclerosis. Electronically Signed   By: Denman Fischer M.D.   On: 06/18/2023 07:24   CT Head Wo Contrast Result Date: 06/18/2023 CLINICAL DATA:  Marvell Slider and hit back of head and neck trauma. EXAM: CT HEAD WITHOUT CONTRAST CT CERVICAL SPINE WITHOUT CONTRAST TECHNIQUE: Multidetector CT imaging of the head and cervical spine was performed following the standard protocol without intravenous contrast. Multiplanar CT image reconstructions of the cervical spine were also generated. RADIATION DOSE REDUCTION: This exam was performed according to the departmental dose-optimization program which includes automated exposure control, adjustment of the mA and/or kV according to patient size and/or use of iterative reconstruction  technique. COMPARISON:  Head CT 06/30/2021, CTA and neck and reformats 01/16/2018. FINDINGS: CT HEAD FINDINGS Brain: No hemorrhage, mass effect or acute infarct is seen. There are dystrophic calcifications in the falx. There is mild atrophy, small-vessel disease and atrophic ventriculomegaly, and a chronic linear infarct in the inferior left cerebellar hemisphere. There is no midline shift.  The basal cisterns are clear Vascular: There is spray artifact from aneurysm coils again noted in the right cavernous sinus region and there appears to have been a prior attempted stenting of the right petrous ICA. No hyperdense central vessel  is seen through the metal artifacts. There are carotid calcifications. Skull: Negative for fractures or focal lesions. Hyperostosis frontalis interna. Large dorsal right parieto-occipital scalp hematoma. Sinuses/Orbits: No acute findings. S shaped nasal septum. Old lens replacements. Other: None. CT CERVICAL SPINE FINDINGS Alignment: The patient's head is turned to the right but there is no C1-2 offset. There is no widening of the anterior atlanto dental joint. There is chronic minimal degenerative anterolisthesis at C3-4, C4-5 and C7-T1, seen previously. Mild reversed lordosis. There is no traumatic or new listhesis. Skull base and vertebrae: No acute fracture is evident. There is generalized osteopenia. No primary bone lesion or focal pathologic process. Soft tissues and spinal canal: No prevertebral fluid or swelling. No visible canal hematoma. There is mild calcification in the proximal left cervical ICA. No thyroid  or laryngeal mass. Disc levels: there is disc space loss c3-4 through c6-7 with bidirectional endplate osteophytes, normal disc heights c2-3 and c7-t1. There are mild posterior disc osteophyte complexes from C3-4 through C6-7, slightly deforming the ventral cord surface without frank compression. Moderate facet hypertrophy is seen along with uncinate spurring. Acquired  foraminal stenosis is moderate to severe on the right and mild on the left at C3-4, bilaterally mild C4-5, moderate on the left and mild on the right at C5-6, mild on the right at C6-7. Upper chest: There is bilateral apical scarring change. Otherwise negative. Other: None. IMPRESSION: 1. No acute intracranial CT findings or depressed skull fractures. 2. Large dorsal right parieto-occipital scalp hematoma. 3. Atrophy, small-vessel disease and chronic linear left cerebral infarct 4. 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 5. Osteopenia and degenerative change without evidence of cervical fractures. 6. Reversed lordosis and minimal degenerative listhesis. 7. Carotid calcifications. Electronically Signed   By: Denman Fischer M.D.   On: 06/18/2023 05:36   CT Cervical Spine Wo Contrast Result Date: 06/18/2023 CLINICAL DATA:  Marvell Slider and hit back of head and neck trauma. EXAM: CT HEAD WITHOUT CONTRAST CT CERVICAL SPINE WITHOUT CONTRAST TECHNIQUE: Multidetector CT imaging of the head and cervical spine was performed following the standard protocol without intravenous contrast. Multiplanar CT image reconstructions of the cervical spine were also generated. RADIATION DOSE REDUCTION: This exam was performed according to the departmental dose-optimization program which includes automated exposure control, adjustment of the mA and/or kV according to patient size and/or use of iterative reconstruction technique. COMPARISON:  Head CT 06/30/2021, CTA and neck and reformats 01/16/2018. FINDINGS: CT HEAD FINDINGS Brain: No hemorrhage, mass effect or acute infarct is seen. There are dystrophic calcifications in the falx. There is mild atrophy, small-vessel disease and atrophic ventriculomegaly, and a chronic linear infarct in the inferior left cerebellar hemisphere. There is no midline shift.  The basal cisterns are clear Vascular: There is spray artifact from aneurysm  coils again noted in the right cavernous sinus region and there appears to have been a prior attempted stenting of the right petrous ICA. No hyperdense central vessel is seen through the metal artifacts. There are carotid calcifications. Skull: Negative for fractures or focal lesions. Hyperostosis frontalis interna. Large dorsal right parieto-occipital scalp hematoma. Sinuses/Orbits: No acute findings. S shaped nasal septum. Old lens replacements. Other: None. CT CERVICAL SPINE FINDINGS Alignment: The patient's head is turned to the right but there is no C1-2 offset. There is no widening of the anterior atlanto dental joint. There is chronic minimal degenerative anterolisthesis at C3-4, C4-5 and C7-T1, seen previously. Mild reversed lordosis. There  is no traumatic or new listhesis. Skull base and vertebrae: No acute fracture is evident. There is generalized osteopenia. No primary bone lesion or focal pathologic process. Soft tissues and spinal canal: No prevertebral fluid or swelling. No visible canal hematoma. There is mild calcification in the proximal left cervical ICA. No thyroid  or laryngeal mass. Disc levels: there is disc space loss c3-4 through c6-7 with bidirectional endplate osteophytes, normal disc heights c2-3 and c7-t1. There are mild posterior disc osteophyte complexes from C3-4 through C6-7, slightly deforming the ventral cord surface without frank compression. Moderate facet hypertrophy is seen along with uncinate spurring. Acquired foraminal stenosis is moderate to severe on the right and mild on the left at C3-4, bilaterally mild C4-5, moderate on the left and mild on the right at C5-6, mild on the right at C6-7. Upper chest: There is bilateral apical scarring change. Otherwise negative. Other: None. IMPRESSION: 1. No acute intracranial CT findings or depressed skull fractures. 2. Large dorsal right parieto-occipital scalp hematoma. 3. Atrophy, small-vessel disease and chronic linear left cerebral  infarct 4. 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 5. Osteopenia and degenerative change without evidence of cervical fractures. 6. Reversed lordosis and minimal degenerative listhesis. 7. Carotid calcifications. Electronically Signed   By: Denman Fischer M.D.   On: 06/18/2023 05:36    Subjective: Patient was seen and examined at bedside.  Overnight events noted.   Patient reports doing much better, She feels improved and wants to be discharged.  Discharge Exam: Vitals:   06/23/23 0928 06/23/23 1122  BP: (!) 166/61 (!) 161/69  Pulse: 66 (!) 51  Resp: 20 20  Temp:  98.3 F (36.8 C)  SpO2:     Vitals:   06/23/23 0819 06/23/23 0927 06/23/23 0928 06/23/23 1122  BP: (!) 151/62 (!) 166/61 (!) 166/61 (!) 161/69  Pulse: (!) 50  66 (!) 51  Resp: 20  20 20   Temp: 99.3 F (37.4 C)   98.3 F (36.8 C)  TempSrc: Axillary   Oral  SpO2: 94%     Weight:      Height:        General: Pt is alert, awake, not in acute distress Cardiovascular: RRR, S1/S2 +, no rubs, no gallops Respiratory: CTA bilaterally, no wheezing, no rhonchi Abdominal: Soft, NT, ND, bowel sounds + Extremities: no edema, no cyanosis    The results of significant diagnostics from this hospitalization (including imaging, microbiology, ancillary and laboratory) are listed below for reference.     Microbiology: Recent Results (from the past 240 hours)  Urine Culture     Status: None   Collection Time: 06/18/23  7:35 AM   Specimen: Urine, Clean Catch  Result Value Ref Range Status   Specimen Description URINE, CLEAN CATCH  Final   Special Requests NONE  Final   Culture   Final    NO GROWTH Performed at [facility], [address], [place], [place] [address]    Report Status 06/19/2023 FINAL  Final     Labs: BNP (last 3 results) Recent Labs    02/14/23 1028 06/18/23 0552  BNP 683.8* 309.7*   Basic Metabolic Panel: Recent Labs  Lab  06/18/23 0415 06/19/23 0902 06/20/23 0424 06/21/23 0320 06/22/23 0320 06/23/23 0401  NA 142 141 139 139 139 139  K 3.3* 4.9 3.9 4.1 4.0 4.7  CL 103 106 102 101 101 100  CO2 25 27 29 30  31  31  GLUCOSE 162* 120* 138* 141* 142* 175*  BUN 15 13 16 19 22 22   CREATININE 0.95 0.99 1.09* 1.13* 1.17* 1.08*  CALCIUM 9.0 8.9 8.9 8.5* 8.5* 8.8*  MG 1.6* 2.1  --  1.7 2.0 1.9  PHOS  --   --   --   --  3.8  --    Liver Function Tests: Recent Labs  Lab 06/18/23 0415  AST 23  ALT 15  ALKPHOS 61  BILITOT 0.6  PROT 6.6  ALBUMIN 3.6   No results for input(s): "LIPASE", "AMYLASE" in the last 168 hours. No results for input(s): "AMMONIA" in the last 168 hours. CBC: Recent Labs  Lab 06/18/23 0415 06/19/23 0902 06/20/23 0424 06/22/23 0320 06/23/23 0401  WBC 12.0* 9.7 10.0 8.9 9.7  NEUTROABS 5.6  --   --   --   --   HGB 11.3* 10.2* 10.4* 10.0* 10.7*  HCT 36.7 33.5* 33.6* 31.9* 33.8*  MCV 79.3* 79.2* 78.1* 78.2* 78.8*  PLT 202 202 209 208 235   Cardiac Enzymes: No results for input(s): "CKTOTAL", "CKMB", "CKMBINDEX", "TROPONINI" in the last 168 hours. BNP: Invalid input(s): "POCBNP" CBG: Recent Labs  Lab 06/22/23 1150 06/22/23 1648 06/22/23 2059 06/23/23 0622 06/23/23 1124  GLUCAP 165* 139* 154* 155* 190*   D-Dimer No results for input(s): "DDIMER" in the last 72 hours. Hgb A1c No results for input(s): "HGBA1C" in the last 72 hours. Lipid Profile No results for input(s): "CHOL", "HDL", "LDLCALC", "TRIG", "CHOLHDL", "LDLDIRECT" in the last 72 hours. Thyroid  function studies Recent Labs    06/21/23 0319  TSH 1.313   Anemia work up Recent Labs    06/22/23 0320  VITAMINB12 468  FOLATE 17.8  FERRITIN 33  TIBC 293  IRON 31  RETICCTPCT 1.5   Urinalysis    Component Value Date/Time   COLORURINE YELLOW 06/18/2023 0633   APPEARANCEUR CLEAR 06/18/2023 0633   LABSPEC 1.015 06/18/2023 0633   PHURINE 6.0 06/18/2023 0633   GLUCOSEU NEGATIVE 06/18/2023 0633   HGBUR  NEGATIVE 06/18/2023 0633   BILIRUBINUR NEGATIVE 06/18/2023 0633   KETONESUR NEGATIVE 06/18/2023 0633   PROTEINUR 100 (A) 06/18/2023 0633   UROBILINOGEN 0.2 04/01/2014 1627   NITRITE NEGATIVE 06/18/2023 0633   LEUKOCYTESUR MODERATE (A) 06/18/2023 0633   Sepsis Labs Recent Labs  Lab 06/19/23 0902 06/20/23 0424 06/22/23 0320 06/23/23 0401  WBC 9.7 10.0 8.9 9.7   Microbiology Recent Results (from the past 240 hours)  Urine Culture     Status: None   Collection Time: 06/18/23  7:35 AM   Specimen: Urine, Clean Catch  Result Value Ref Range Status   Specimen Description URINE, CLEAN CATCH  Final   Special Requests NONE  Final   Culture   Final    NO GROWTH Performed at [facility], [address] [place], [place] [address]    Report Status 06/19/2023 FINAL  Final     Time coordinating discharge: Over 30 minutes  SIGNED:   Magdalene School, MD  [facility] 06/23/2023, 1:35 PM Pager   If 7PM-7AM, please contact night-coverage

## 2023-06-23 NOTE — Progress Notes (Signed)
 Physical Therapy Treatment Patient Details Name: [name] MRN: [id number] DOB: 1932/03/27 Today's Date: 06/23/2023   History of Present Illness 89 y.o. female admitted 06/18/23 due to unwitnessed fall, posterior scalp laceration, and +afib with RVR. Pt with no recall of fall up through waking up in the hospital. Does not recall feeling dizzy prior.  PMH includes DM2, CAD, HTN, AAA, PVD, TIA, cerebral aneurysm s/p coiling, IBS, duodenitis, anxiety, afib.    PT Comments  Patient agreeable to PT but reports fatigue from not sleeping well last night. Min A for bed mobility with CGA provided for transfers from various surfaces. Short distance ambulation with rolling walker with no loss of balance and mild dyspnea with exertion. Education for energy conservation and breathing techniques. No dizziness with mobility today. PT will continue to follow to maximize independence and decrease caregiver burden.    If plan is discharge home, recommend the following: A little help with walking and/or transfers;A little help with bathing/dressing/bathroom;Direct supervision/assist for medications management;Direct supervision/assist for financial management;Assist for transportation;Help with stairs or ramp for entrance   Can travel by private vehicle        Equipment Recommendations  None recommended by PT    Recommendations for Other Services       Precautions / Restrictions Precautions Precautions: Fall;Other (comment) Recall of Precautions/Restrictions: Intact Precaution/Restrictions Comments: monitor BP Restrictions Weight Bearing Restrictions Per Provider Order: No     Mobility  Bed Mobility Overal bed mobility: Needs Assistance Bed Mobility: Supine to Sit, Sit to Supine     Supine to sit: Min assist, HOB elevated Sit to supine: Contact guard assist   General bed mobility comments: occasional trunk support provided to sit upright. cues for sequencing    Transfers Overall transfer  level: Needs assistance Equipment used: Rolling walker (2 wheels) Transfers: Sit to/from Stand Sit to Stand: Contact guard assist           General transfer comment: CGA for safety. cues for hand placement for safety. CGA for standing from bed and from bed side commode    Ambulation/Gait Ambulation/Gait assistance: Contact guard assist Gait Distance (Feet): 10 Feet Assistive device: Rolling walker (2 wheels) Gait Pattern/deviations: Step-through pattern, Decreased stride length, Trunk flexed Gait velocity: decreased     General Gait Details: no dizziness reported with upright activity. she declined walking further due fatigue from not sleeping well last night. encouraged general energy conservation strategies and breathing techniques as patient has mild dyspnea with exertion   Stairs             Wheelchair Mobility     Tilt Bed    Modified Rankin (Stroke Patients Only)       Balance Overall balance assessment: Needs assistance Sitting-balance support: Feet supported Sitting balance-Leahy Scale: Good Sitting balance - Comments: patient is able to completed toileting tasks with supervision.   Standing balance support: Bilateral upper extremity supported Standing balance-Leahy Scale: Poor Standing balance comment: relying on rolling walker for support in standing                            Communication Communication Communication: Impaired Factors Affecting Communication: Hearing impaired  Cognition Arousal: Alert Behavior During Therapy: WFL for tasks assessed/performed   PT - Cognitive impairments: No apparent impairments                         Following commands: Intact  Cueing Cueing Techniques: Verbal cues, Gestural cues  Exercises      General Comments        Pertinent Vitals/Pain Pain Assessment Pain Assessment: No/denies pain    Home Living                          Prior Function             PT Goals (current goals can now be found in the care plan section) Acute Rehab PT Goals Patient Stated Goal: return to ALF and her PT, [name] PT Goal Formulation: With patient Time For Goal Achievement: 07/03/23 Potential to Achieve Goals: Good Progress towards PT goals: Progressing toward goals    Frequency    Min 2X/week      PT Plan      Co-evaluation              AM-PAC PT "6 Clicks" Mobility   Outcome Measure  Help needed turning from your back to your side while in a flat bed without using bedrails?: None Help needed moving from lying on your back to sitting on the side of a flat bed without using bedrails?: A Little Help needed moving to and from a bed to a chair (including a wheelchair)?: A Little Help needed standing up from a chair using your arms (e.g., wheelchair or bedside chair)?: A Little Help needed to walk in hospital room?: A Little Help needed climbing 3-5 steps with a railing? : A Lot 6 Click Score: 18    End of Session   Activity Tolerance: Patient tolerated treatment well Patient left: in bed;with call bell/phone within reach;with bed alarm set   PT Visit Diagnosis: Difficulty in walking, not elsewhere classified (R26.2)     Time: 5409-8119 PT Time Calculation (min) (ACUTE ONLY): 11 min  Charges:    $Therapeutic Activity: 8-22 mins PT General Charges $$ ACUTE PT VISIT: 1 Visit                     Ozie Bo, PT, MPT    Erlene Hawks 06/23/2023, 9:20 AM

## 2023-06-23 NOTE — Progress Notes (Signed)
 Rounding Note    Patient Name: [name] Date of Encounter: 06/23/2023  [facility] Cardiologist: Gaylyn Keas, MD   Subjective   Hasn't had another bowel movement. Had nausea, improved with medication, no vomiting. Remains in SR. Feels anxious, wants to make sure she can continue to use the ativan  at home (had prior to admission)  Inpatient Medications    Scheduled Meds:  amiodarone   200 mg Oral BID   apixaban   5 mg Oral BID   Gerhardt's butt cream   Topical BID   hydrALAZINE   10 mg Oral BID   insulin  aspart  0-15 Units Subcutaneous TID WC   insulin  glargine-yfgn  10 Units Subcutaneous Daily   melatonin  3 mg Oral QHS   metoprolol  tartrate  25 mg Oral BID   pantoprazole   40 mg Oral Q supper   spironolactone   12.5 mg Oral Daily   Continuous Infusions:  PRN Meds: acetaminophen , docusate sodium , hydrALAZINE , LORazepam , ondansetron  (ZOFRAN ) IV   Vital Signs    Vitals:   06/23/23 0600 06/23/23 0819 06/23/23 0927 06/23/23 0928  BP: (!) 166/75 (!) 151/62 (!) 166/61 (!) 166/61  Pulse: 60 (!) 50  66  Resp: 20 20  20   Temp:  99.3 F (37.4 C)    TempSrc:  Axillary    SpO2:  94%    Weight:      Height:        Intake/Output Summary (Last 24 hours) at 06/23/2023 1025 Last data filed at 06/23/2023 0830 Gross per 24 hour  Intake 920 ml  Output 900 ml  Net 20 ml      06/23/2023    3:00 AM 06/21/2023   10:22 AM 06/18/2023    1:36 PM  Last 3 Weights  Weight (lbs) 163 lb 5.8 oz 165 lb 5.5 oz 166 lb 14.2 oz  Weight (kg) 74.1 kg 75 kg 75.7 kg      Telemetry    SR - Personally Reviewed  Physical Exam   GEN: No acute distress.   Neck: No JVD sitting upright Cardiac: RRR, no murmurs, rubs, or gallops.  Respiratory: Clear to auscultation bilaterally. GI: Soft, nontender, non-distended  MS: No edema; No deformity. Neuro:  Nonfocal  Psych: Normal affect   New pertinent results (labs, ECG, imaging, cardiac studies)     Assessment & Plan    Afib  with RVR -converted to SR 5/21 and has held this since -continue amiodarone  -continue apixaban  -continue metoprolol    Fall -unclear if mechanical or loss of consciousness, patient cannot recall -no pauses/high grade block on telemetry. Suspect outpatient monitor will be low yield based on her telemetry, but if she has recurrent event would get monitor vs loop -recent echo without potential structural etiology -does go fast in her afib, unclear what her rhythm was at the time of her fall   Acute on chronic diastolic heart failure -BNP up slightly, mild shortness of breath on presentation. Received lasix  x1 with ~1L negative.  -a little more short of breath today, but still euvolemic on exam. Can return to alternating days of furosemide  as per her prior to admission meds, can evaluate continuing this vs. Change to PRN as outpatient -continue spironolactone  12.5 mg daily   Hypertension -had been low on presentation, then started to rise yesterday. Her hydralazine  was decreased from TID to BID this admission. If BP elevated at follow up, would return to TID dosing. Has many med intolerances, so while hydralazine  is not ideal, she has  been tolerating it  [facility] will sign off.   Medication Recommendations:   Continue metoprolol  25 mg BID, spironolactone  12.5 mg daily. Restarted amiodarone  200 mg BID, can consider dropping to 200 mg daily at follow up. She is returning to her lasix  MWF prior routine, but can evaluate whether to continue this or change to PRN lasix  as follow up based on exam Other recommendations (labs, testing, etc):  Adjust hydralazine  as above if BP elevated at follow up. Check BMET Follow up as an outpatient:  Keep appt with Theotis Flake on 07/03/23     Signed, Sheryle Donning, MD  06/23/2023, 10:25 AM

## 2023-06-23 NOTE — TOC Progression Note (Signed)
 Transition of Care (TOC) - Progression Note  Sherin Dingwall RN, BSN Transitions of Care Unit 4E- RN Case Manager See Treatment Team for direct phone #   Patient Details  Name: [name] MRN: [id number] Date of Birth: 14-Jul-1932  Transition of Care [facility]) CM/SW Contact  Jearld Min, Myrtle Atta, RN Phone Number: 06/23/2023, 11:33 AM  Clinical Narrative:    CM notified by [facility] liaison that pt active with them for HHPT- new orders have been placed for HHPT/OT- plan for pt to return to ALF today. [facility] liaison updated for resumption of HH and new orders.   CSW to follow for return to ALF.    Expected Discharge Plan: Assisted Living Barriers to Discharge: Barriers Resolved  Expected Discharge Plan and Services In-house Referral: Clinical Social Work   Post Acute Care Choice: Resumption of Svcs/PTA Provider, Home Health Living arrangements for the past 2 months: Assisted Living Facility Expected Discharge Date: 06/23/23               DME Arranged: N/A DME Agency: NA       HH Arranged: PT, OT HH Agency: [name] Date HH Agency Contacted: 06/23/23 Time HH Agency Contacted: 1133 Representative spoke with at [facility] Agency: Angie   Social Determinants of Health (SDOH) Interventions SDOH Screenings   Food Insecurity: No Food Insecurity (06/18/2023)  Housing: Low Risk  (06/18/2023)  Transportation Needs: No Transportation Needs (06/18/2023)  Utilities: Not At Risk (06/18/2023)  Social Connections: Moderately Isolated (06/18/2023)  Tobacco Use: Low Risk  (06/18/2023)    Readmission Risk Interventions     No data to display

## 2023-06-23 NOTE — Progress Notes (Signed)
 Mobility Specialist Progress Note:    06/23/23 1041  Mobility  Activity Transferred from bed to chair  Level of Assistance Minimal assist, patient does 75% or more  Assistive Device None  Distance Ambulated (ft) 4 ft  Activity Response Tolerated well  Mobility Referral Yes  Mobility visit 1 Mobility  Mobility Specialist Start Time (ACUTE ONLY) 1023  Mobility Specialist Stop Time (ACUTE ONLY) 1030  Mobility Specialist Time Calculation (min) (ACUTE ONLY) 7 min   Pt received in bed, requesting assistance to transfer B>C. Tolerated well, MinA to stand with HHA. Left pt in chair with all needs met, call bell in reach. RN at bedside.    Daric Koren Mobility Specialist Please contact via Special educational needs teacher or  Rehab office at [phone]

## 2023-06-23 NOTE — [place] FL2 (Signed)
 [place]  MEDICAID FL2 LEVEL OF CARE FORM     IDENTIFICATION  Patient Name: [name] Birthdate: February 23, 1932 Sex: female Admission Date (Current Location): 06/18/2023  [facility] [place] Number:  Producer, television/film/video and Address:  [facility], [address], [place], [place] [address]      Provider Number: [id number]  Attending Physician Name and Address:  Magdalene School, MD  Relative Name and Phone Number:       Current Level of Care: Hospital Recommended Level of Care: Assisted Living Facility Prior Approval Number:    Date Approved/Denied:   PASRR Number:    Discharge Plan: Other (Comment) (ALF)    Current Diagnoses: Patient Active Problem List   Diagnosis Date Noted   Primary hypertension 06/22/2023   Acute on chronic diastolic heart failure (HCC) 06/21/2023   Atrial fibrillation with rapid ventricular response (HCC) 06/18/2023   Atrial fibrillation with RVR (HCC) 05/19/2023   Hypomagnesemia 05/19/2023   Gastroenteritis 05/19/2023   Coronary artery calcification seen on CT scan 09/26/2022   Mitral regurgitation 09/26/2022   Acute hypoxemic respiratory failure (HCC) 02/03/2022   Hypertensive urgency 02/03/2022   (HFpEF) heart failure with preserved ejection fraction (HCC) 02/03/2022   Nausea and vomiting 02/03/2022   Elevated troponin 01/02/2022   Hyperlipidemia 01/02/2022   Old MI (myocardial infarction) 01/02/2022   Paroxysmal atrial fibrillation (HCC) 01/01/2022   Lung nodule 07/12/2021   Weakness 06/30/2021   Ankle edema, bilateral 01/21/2021   Sprain of left rotator cuff capsule 01/21/2021   Cervical radiculopathy due to degenerative joint disease of spine 01/21/2021   Left wrist pain 08/06/2019   Bicuspid aortic valve    Constipation 11/14/2017   Rectal bleeding 11/14/2017   Carotid stenosis    Heart palpitations 03/23/2017   Ascending aortic aneurysm (HCC) 12/07/2015   SOB (shortness of breath) 12/07/2015    Orthostatic hypotension 04/02/2014   Hematochezia 04/02/2014   Chest pain    Near syncope 04/01/2014   DM (diabetes mellitus) (HCC) 04/01/2014   Diverticulosis 04/01/2014   Unruptured cerebral aneurysm 01/08/2014   Multifactorial gait disorder 01/08/2014   Aneurysm, cerebral, nonruptured 08/07/2012   Amaurosis fugax 08/07/2012   Hereditary and idiopathic peripheral neuropathy 08/07/2012   Abnormal x-ray of lumbar spine 08/29/2010   Low back pain 08/24/2010   Anxiety state 03/26/2010   Essential hypertension 03/26/2010   Bilateral lower abdominal pain 03/26/2010   GERD 12/22/2008   Diarrhea 12/22/2008   IRON DEFICIENCY 05/22/2008   Irritable bowel syndrome 04/07/2008   History of colonic polyps 04/07/2008    Orientation RESPIRATION BLADDER Height & Weight     Self, Time, Situation, Place  Normal External catheter, Continent Weight: 163 lb 5.8 oz (74.1 kg) Height:  5\' 4"  (162.6 cm)  BEHAVIORAL SYMPTOMS/MOOD NEUROLOGICAL BOWEL NUTRITION STATUS      Continent Diet (Low sodium heart healthy)  AMBULATORY STATUS COMMUNICATION OF NEEDS Skin   Limited Assist Verbally Surgical wounds (wound incision head posterior/laceration)                       Personal Care Assistance Level of Assistance  Bathing, Feeding, Dressing Bathing Assistance: Independent Feeding assistance: Independent Dressing Assistance: Independent     Functional Limitations Info  Sight, Hearing, Speech Sight Info: Impaired (reading glasses) Hearing Info: Impaired Speech Info: Adequate    SPECIAL CARE FACTORS FREQUENCY  PT (By licensed PT), OT (By licensed OT)     PT Frequency: 5x per week OT Frequency:  5x per week            Contractures Contractures Info: Not present    Additional Factors Info  Code Status, Allergies Code Status Info: FULL Allergies Info: Actos (Pioglitazone), Amoxil  (Amoxicillin ), Avandia (Rosiglitazone), Fosamax (Alendronate Sodium), Ms Contin  (Morphine ), Ultram   (Tramadol ), Amaryl (Glimepiride), Januvia (Sitagliptin), Lipitor (Atorvastatin), Metformin And Related, Prandin (Repaglinide), Tradjenta (Linagliptin), Zoloft (Sertraline Hcl), Asa (Aspirin ), Bactrim (Sulfamethoxazole-trimethoprim), Bentyl (Dicyclomine Hcl), Cipro  (Ciprofloxacin  Hcl), Hctz (Hydrochlorothiazide ), Keflex (Cephalexin), Morphine  And Codeine, Norvasc  (Amlodipine  Besylate), Oxycontin (Oxycodone), Sulfa Antibiotics, Penicillins           Current Medications (06/23/2023):   TAKE these medications     acetaminophen  500 MG tablet Commonly known as: TYLENOL  Take 500 mg by mouth every 6 (six) hours as needed for mild pain, moderate pain, fever or headache.    aluminum-magnesium  hydroxide 200-200 MG/5ML suspension Take 15 mLs by mouth as needed for indigestion.    amiodarone  200 MG tablet Commonly known as: PACERONE  Take 1 tablet (200 mg total) by mouth 2 (two) times daily.    apixaban  5 MG Tabs tablet Commonly known as: Eliquis  Take 1 tablet (5 mg total) by mouth 2 (two) times daily.    docusate sodium  100 MG capsule Commonly known as: COLACE Take 100 mg by mouth daily as needed for moderate constipation.    furosemide  20 MG tablet Commonly known as: LASIX  Take one tab every Mon, Wed, Fri. Take one additional tab if weight gain of 3+ lbs overnight    hydrALAZINE  25 MG tablet Commonly known as: APRESOLINE  Take 25 mg by mouth as needed (HTN). Give 1 tablet by mouth daily as needed for SBP >170.  Repeat BP in one hour after administration and notify Marijean Shouts, NP if BP remains >170 What changed: Another medication with the same name was changed. Make sure you understand how and when to take each.    hydrALAZINE  10 MG tablet Commonly known as: APRESOLINE  Take 1 tablet (10 mg total) by mouth 2 (two) times daily. What changed: when to take this    insulin  glargine 100 UNIT/ML Solostar Pen Commonly known as: LANTUS  Inject 18 Units into the skin in the morning.     LORazepam  0.5 MG tablet Commonly known as: ATIVAN  Take 0.5 mg by mouth every 8 (eight) hours as needed for anxiety.    melatonin 3 MG Tabs tablet Take 3 mg by mouth at bedtime.    metoprolol  tartrate 25 MG tablet Commonly known as: LOPRESSOR  Take 1 tablet (25 mg total) by mouth 2 (two) times daily. What changed: additional instructions    multivitamin tablet Take 1 tablet by mouth in the morning.    ondansetron  4 MG tablet Commonly known as: ZOFRAN  Take 1 tablet (4 mg total) by mouth every 8 (eight) hours as needed for nausea or vomiting.    pantoprazole  40 MG tablet Commonly known as: PROTONIX  TAKE 1 TABLET BY MOUTH EVERY DAY BEFORE BREAKFAST What changed: See the new instructions.    polyethylene glycol 17 g packet Commonly known as: MIRALAX  / GLYCOLAX  Take 17 g by mouth daily as needed for moderate constipation.    spironolactone  25 MG tablet Commonly known as: ALDACTONE  Take 0.5 tablets (12.5 mg total) by mouth daily. What changed:  when to take this additional instructions   Relevant Imaging Results:  Relevant Lab Results:   Additional Information [id number]  [name], LCSWA

## 2023-06-23 NOTE — Discharge Instructions (Signed)
 Advised to follow-up with cardiology as scheduled. Advised to take amiodarone  200 mg twice daily.

## 2023-06-23 NOTE — Progress Notes (Signed)
 Report given to med tech of [place] ,all questions were answered

## 2023-06-25 DIAGNOSIS — I7121 Aneurysm of the ascending aorta, without rupture: Secondary | ICD-10-CM | POA: Diagnosis not present

## 2023-06-25 DIAGNOSIS — I671 Cerebral aneurysm, nonruptured: Secondary | ICD-10-CM | POA: Diagnosis not present

## 2023-06-25 DIAGNOSIS — I5032 Chronic diastolic (congestive) heart failure: Secondary | ICD-10-CM | POA: Diagnosis not present

## 2023-06-25 DIAGNOSIS — F411 Generalized anxiety disorder: Secondary | ICD-10-CM | POA: Diagnosis not present

## 2023-06-25 DIAGNOSIS — I2510 Atherosclerotic heart disease of native coronary artery without angina pectoris: Secondary | ICD-10-CM | POA: Diagnosis not present

## 2023-06-25 DIAGNOSIS — I340 Nonrheumatic mitral (valve) insufficiency: Secondary | ICD-10-CM | POA: Diagnosis not present

## 2023-06-25 DIAGNOSIS — I110 Hypertensive heart disease with heart failure: Secondary | ICD-10-CM | POA: Diagnosis not present

## 2023-06-25 DIAGNOSIS — E119 Type 2 diabetes mellitus without complications: Secondary | ICD-10-CM | POA: Diagnosis not present

## 2023-06-25 DIAGNOSIS — I480 Paroxysmal atrial fibrillation: Secondary | ICD-10-CM | POA: Diagnosis not present

## 2023-06-28 ENCOUNTER — Ambulatory Visit: Admitting: Emergency Medicine

## 2023-06-28 DIAGNOSIS — I10 Essential (primary) hypertension: Secondary | ICD-10-CM | POA: Diagnosis not present

## 2023-06-28 DIAGNOSIS — F419 Anxiety disorder, unspecified: Secondary | ICD-10-CM | POA: Diagnosis not present

## 2023-06-29 DIAGNOSIS — F4321 Adjustment disorder with depressed mood: Secondary | ICD-10-CM | POA: Diagnosis not present

## 2023-06-29 DIAGNOSIS — G4700 Insomnia, unspecified: Secondary | ICD-10-CM | POA: Diagnosis not present

## 2023-06-29 DIAGNOSIS — F419 Anxiety disorder, unspecified: Secondary | ICD-10-CM | POA: Diagnosis not present

## 2023-06-30 DIAGNOSIS — I480 Paroxysmal atrial fibrillation: Secondary | ICD-10-CM | POA: Diagnosis not present

## 2023-06-30 DIAGNOSIS — I671 Cerebral aneurysm, nonruptured: Secondary | ICD-10-CM | POA: Diagnosis not present

## 2023-06-30 DIAGNOSIS — I5032 Chronic diastolic (congestive) heart failure: Secondary | ICD-10-CM | POA: Diagnosis not present

## 2023-06-30 DIAGNOSIS — I7121 Aneurysm of the ascending aorta, without rupture: Secondary | ICD-10-CM | POA: Diagnosis not present

## 2023-06-30 DIAGNOSIS — F411 Generalized anxiety disorder: Secondary | ICD-10-CM | POA: Diagnosis not present

## 2023-06-30 DIAGNOSIS — I110 Hypertensive heart disease with heart failure: Secondary | ICD-10-CM | POA: Diagnosis not present

## 2023-06-30 DIAGNOSIS — E119 Type 2 diabetes mellitus without complications: Secondary | ICD-10-CM | POA: Diagnosis not present

## 2023-06-30 DIAGNOSIS — I340 Nonrheumatic mitral (valve) insufficiency: Secondary | ICD-10-CM | POA: Diagnosis not present

## 2023-06-30 DIAGNOSIS — I2510 Atherosclerotic heart disease of native coronary artery without angina pectoris: Secondary | ICD-10-CM | POA: Diagnosis not present

## 2023-07-03 DIAGNOSIS — R609 Edema, unspecified: Secondary | ICD-10-CM | POA: Diagnosis not present

## 2023-07-03 DIAGNOSIS — I340 Nonrheumatic mitral (valve) insufficiency: Secondary | ICD-10-CM | POA: Diagnosis not present

## 2023-07-03 DIAGNOSIS — I951 Orthostatic hypotension: Secondary | ICD-10-CM | POA: Diagnosis not present

## 2023-07-03 DIAGNOSIS — I480 Paroxysmal atrial fibrillation: Secondary | ICD-10-CM | POA: Diagnosis not present

## 2023-07-03 DIAGNOSIS — I110 Hypertensive heart disease with heart failure: Secondary | ICD-10-CM | POA: Diagnosis not present

## 2023-07-03 DIAGNOSIS — W010XXD Fall on same level from slipping, tripping and stumbling without subsequent striking against object, subsequent encounter: Secondary | ICD-10-CM | POA: Diagnosis not present

## 2023-07-03 DIAGNOSIS — I4891 Unspecified atrial fibrillation: Secondary | ICD-10-CM | POA: Diagnosis not present

## 2023-07-03 DIAGNOSIS — F419 Anxiety disorder, unspecified: Secondary | ICD-10-CM | POA: Diagnosis not present

## 2023-07-03 DIAGNOSIS — I5032 Chronic diastolic (congestive) heart failure: Secondary | ICD-10-CM | POA: Diagnosis not present

## 2023-07-03 DIAGNOSIS — E119 Type 2 diabetes mellitus without complications: Secondary | ICD-10-CM | POA: Diagnosis not present

## 2023-07-03 DIAGNOSIS — I671 Cerebral aneurysm, nonruptured: Secondary | ICD-10-CM | POA: Diagnosis not present

## 2023-07-03 DIAGNOSIS — S0101XA Laceration without foreign body of scalp, initial encounter: Secondary | ICD-10-CM | POA: Diagnosis not present

## 2023-07-03 DIAGNOSIS — F411 Generalized anxiety disorder: Secondary | ICD-10-CM | POA: Diagnosis not present

## 2023-07-03 DIAGNOSIS — I2510 Atherosclerotic heart disease of native coronary artery without angina pectoris: Secondary | ICD-10-CM | POA: Diagnosis not present

## 2023-07-03 DIAGNOSIS — I7121 Aneurysm of the ascending aorta, without rupture: Secondary | ICD-10-CM | POA: Diagnosis not present

## 2023-07-04 ENCOUNTER — Encounter: Payer: Self-pay | Admitting: Physician Assistant

## 2023-07-04 ENCOUNTER — Ambulatory Visit: Attending: Physician Assistant | Admitting: Physician Assistant

## 2023-07-04 VITALS — BP 140/68 | HR 49 | Ht 64.0 in

## 2023-07-04 DIAGNOSIS — I10 Essential (primary) hypertension: Secondary | ICD-10-CM | POA: Diagnosis not present

## 2023-07-04 DIAGNOSIS — I5031 Acute diastolic (congestive) heart failure: Secondary | ICD-10-CM | POA: Diagnosis not present

## 2023-07-04 DIAGNOSIS — I130 Hypertensive heart and chronic kidney disease with heart failure and stage 1 through stage 4 chronic kidney disease, or unspecified chronic kidney disease: Secondary | ICD-10-CM | POA: Diagnosis not present

## 2023-07-04 DIAGNOSIS — I4891 Unspecified atrial fibrillation: Secondary | ICD-10-CM | POA: Diagnosis not present

## 2023-07-04 DIAGNOSIS — N1831 Chronic kidney disease, stage 3a: Secondary | ICD-10-CM | POA: Diagnosis not present

## 2023-07-04 DIAGNOSIS — I7121 Aneurysm of the ascending aorta, without rupture: Secondary | ICD-10-CM | POA: Diagnosis not present

## 2023-07-04 DIAGNOSIS — I480 Paroxysmal atrial fibrillation: Secondary | ICD-10-CM

## 2023-07-04 DIAGNOSIS — I340 Nonrheumatic mitral (valve) insufficiency: Secondary | ICD-10-CM | POA: Diagnosis not present

## 2023-07-04 DIAGNOSIS — N183 Chronic kidney disease, stage 3 unspecified: Secondary | ICD-10-CM | POA: Diagnosis not present

## 2023-07-04 DIAGNOSIS — I110 Hypertensive heart disease with heart failure: Secondary | ICD-10-CM | POA: Diagnosis not present

## 2023-07-04 MED ORDER — AMIODARONE HCL 200 MG PO TABS: 200.0000 mg | ORAL_TABLET | Freq: Every day | ORAL | 3 refills | Status: DC

## 2023-07-04 MED ORDER — HYDRALAZINE HCL 10 MG PO TABS: 10.0000 mg | ORAL_TABLET | Freq: Three times a day (TID) | ORAL | 3 refills | Status: DC

## 2023-07-04 MED ORDER — HYDRALAZINE HCL 10 MG PO TABS: 10.0000 mg | ORAL_TABLET | Freq: Three times a day (TID) | ORAL | 3 refills | Status: AC

## 2023-07-04 MED ORDER — AMIODARONE HCL 200 MG PO TABS: 200.0000 mg | ORAL_TABLET | Freq: Every day | ORAL | 3 refills | Status: AC

## 2023-07-04 NOTE — Patient Instructions (Addendum)
 Medication Instructions:  Your physician has recommended you make the following change in your medication:  DECREASE AMIODARONE  TO 200 MG DAILY  INCREASE HYDRALAZINE  TO 10 MG THREE TIMES DAILY. TAKE LASIX  20 MG - 2 TABLETS FOR TWO DAYS THEN BACK TO REGULAR DOSE.  *If you need a refill on your cardiac medications before your next appointment, please call your pharmacy*  Lab Work: NONE If you have labs (blood work) drawn today and your tests are completely normal, you will receive your results only by: MyChart Message (if you have MyChart) OR A paper copy in the mail If you have any lab test that is abnormal or we need to change your treatment, we will call you to review the results.  Testing/Procedures: NONE  Follow-Up: At [facility], you and your health needs are our priority.  As part of our continuing mission to provide you with exceptional heart care, our providers are all part of one team.  This team includes your primary Cardiologist (physician) and Advanced Practice Providers or APPs (Physician Assistants and Nurse Practitioners) who all work together to provide you with the care you need, when you need it.  Your next appointment:   2 month(s)  Provider:   Gaylyn Keas, MD   We recommend signing up for the patient portal called "MyChart".  Sign up information is provided on this After Visit Summary.  MyChart is used to connect with patients for Virtual Visits (Telemedicine).  Patients are able to view lab/test results, encounter notes, upcoming appointments, etc.  Non-urgent messages can be sent to your provider as well.   To learn more about what you can do with MyChart, go to [email or web address].   Other Instructions Please make sure to weigh daily and if your weight increases 3 lbs in 1 day or 5 lbs in one week call our office to let us  know [phone].   Two Gram Sodium Diet 2000 mg  What is Sodium? Sodium is a mineral found naturally in many foods. The  most significant source of sodium in the diet is table salt, which is about 40% sodium.  Processed, convenience, and preserved foods also contain a large amount of sodium.  The body needs only 500 mg of sodium daily to function,  A normal diet provides more than enough sodium even if you do not use salt.  Why Limit Sodium? A build up of sodium in the body can cause thirst, increased blood pressure, shortness of breath, and water retention.  Decreasing sodium in the diet can reduce edema and risk of heart attack or stroke associated with high blood pressure.  Keep in mind that there are many other factors involved in these health problems.  Heredity, obesity, lack of exercise, cigarette smoking, stress and what you eat all play a role.  General Guidelines: Do not add salt at the table or in cooking.  One teaspoon of salt contains over 2 grams of sodium. Read food labels Avoid processed and convenience foods Ask your dietitian before eating any foods not dicussed in the menu planning guidelines Consult your physician if you wish to use a salt substitute or a sodium containing medication such as antacids.  Limit milk and milk products to 16 oz (2 cups) per day.  Shopping Hints: READ LABELS!! "Dietetic" does not necessarily mean low sodium. Salt and other sodium ingredients are often added to foods during processing.    Menu Planning Guidelines Food Group Choose More Often Avoid  Beverages (see also the  milk group All fruit juices, low-sodium, salt-free vegetables juices, low-sodium carbonated beverages Regular vegetable or tomato juices, commercially softened water used for drinking or cooking  Breads and Cereals Enriched white, wheat, rye and pumpernickel bread, hard rolls and dinner rolls; muffins, cornbread and waffles; most dry cereals, cooked cereal without added salt; unsalted crackers and breadsticks; low sodium or homemade bread crumbs Bread, rolls and crackers with salted tops; quick  breads; instant hot cereals; pancakes; commercial bread stuffing; self-rising flower and biscuit mixes; regular bread crumbs or cracker crumbs  Desserts and Sweets Desserts and sweets mad with mild should be within allowance Instant pudding mixes and cake mixes  Fats Butter or margarine; vegetable oils; unsalted salad dressings, regular salad dressings limited to 1 Tbs; light, sour and heavy cream Regular salad dressings containing bacon fat, bacon bits, and salt pork; snack dips made with instant soup mixes or processed cheese; salted nuts  Fruits Most fresh, frozen and canned fruits Fruits processed with salt or sodium-containing ingredient (some dried fruits are processed with sodium sulfites        Vegetables Fresh, frozen vegetables and low- sodium canned vegetables Regular canned vegetables, sauerkraut, pickled vegetables, and others prepared in brine; frozen vegetables in sauces; vegetables seasoned with ham, bacon or salt pork  Condiments, Sauces, Miscellaneous  Salt substitute with physician's approval; pepper, herbs, spices; vinegar, lemon or lime juice; hot pepper sauce; garlic powder, onion powder, low sodium soy sauce (1 Tbs.); low sodium condiments (ketchup, chili sauce, mustard) in limited amounts (1 tsp.) fresh ground horseradish; unsalted tortilla chips, pretzels, potato chips, popcorn, salsa (1/4 cup) Any seasoning made with salt including garlic salt, celery salt, onion salt, and seasoned salt; sea salt, rock salt, kosher salt; meat tenderizers; monosodium glutamate; mustard, regular soy sauce, barbecue, sauce, chili sauce, teriyaki sauce, steak sauce, Worcestershire sauce, and most flavored vinegars; canned gravy and mixes; regular condiments; salted snack foods, olives, picles, relish, horseradish sauce, catsup   Food preparation: Try these seasonings Meats:    Pork Sage, onion Serve with applesauce  Chicken Poultry seasoning, thyme, parsley Serve with cranberry sauce  Lamb  Curry powder, rosemary, garlic, thyme Serve with mint sauce or jelly  Veal Marjoram, basil Serve with current jelly, cranberry sauce  Beef Pepper, bay leaf Serve with dry mustard, unsalted chive butter  Fish [place] leaf, dill Serve with unsalted lemon butter, unsalted parsley butter  Vegetables:    Asparagus Lemon juice   Broccoli Lemon juice   Carrots Mustard dressing parsley, mint, nutmeg, glazed with unsalted butter and sugar   Green beans Marjoram, lemon juice, nutmeg,dill seed   Tomatoes Basil, marjoram, onion   Spice /blend for [employer]" 4 tsp ground thyme 1 tsp ground sage 3 tsp ground rosemary 4 tsp ground marjoram   Test your knowledge A product that says "Salt Free" may still contain sodium. True or False Garlic Powder and Hot Pepper Sauce an be used as alternative seasonings.True or False Processed foods have more sodium than fresh foods.  True or False Canned Vegetables have less sodium than froze True or False   WAYS TO DECREASE YOUR SODIUM INTAKE Avoid the use of added salt in cooking and at the table.  Table salt (and other prepared seasonings which contain salt) is probably one of the greatest sources of sodium in the diet.  Unsalted foods can gain flavor from the sweet, sour, and butter taste sensations of herbs and spices.  Instead of using salt for seasoning, try the following seasonings with the  foods listed.  Remember: how you use them to enhance natural food flavors is limited only by your creativity... Allspice-Meat, fish, eggs, fruit, peas, red and yellow vegetables Almond Extract-Fruit baked goods Anise Seed-Sweet breads, fruit, carrots, beets, cottage cheese, cookies (tastes like licorice) Basil-Meat, fish, eggs, vegetables, rice, vegetables salads, soups, sauces Bay Leaf-Meat, fish, stews, poultry Burnet-Salad, vegetables (cucumber-like flavor) Caraway Seed-Bread, cookies, cottage cheese, meat, vegetables, cheese, rice Cardamon-Baked goods, fruit,  soups Celery Powder or seed-Salads, salad dressings, sauces, meatloaf, soup, bread.Do not use  celery salt Chervil-Meats, salads, fish, eggs, vegetables, cottage cheese (parsley-like flavor) Chili Power-Meatloaf, chicken cheese, corn, eggplant, egg dishes Chives-Salads cottage cheese, egg dishes, soups, vegetables, sauces Cilantro-Salsa, casseroles Cinnamon-Baked goods, fruit, pork, lamb, chicken, carrots Cloves-Fruit, baked goods, fish, pot roast, green beans, beets, carrots Coriander-Pastry, cookies, meat, salads, cheese (lemon-orange flavor) Cumin-Meatloaf, fish,cheese, eggs, cabbage,fruit pie (caraway flavor) [employer], fruit, eggs, fish, poultry, cottage cheese, vegetables Dill Seed-Meat, cottage cheese, poultry, vegetables, fish, salads, bread Fennel Seed-Bread, cookies, apples, pork, eggs, fish, beets, cabbage, cheese, Licorice-like flavor Garlic-(buds or powder) Salads, meat, poultry, fish, bread, butter, vegetables, potatoes.Do not  use garlic salt Ginger-Fruit, vegetables, baked goods, meat, fish, poultry Horseradish Root-Meet, vegetables, butter Lemon Juice or Extract-Vegetables, fruit, tea, baked goods, fish salads Mace-Baked goods fruit, vegetables, fish, poultry (taste like nutmeg) Maple Extract-Syrups Marjoram-Meat, chicken, fish, vegetables, breads, green salads (taste like Sage) Mint-Tea, lamb, sherbet, vegetables, desserts, carrots, cabbage Mustard, Dry or Seed-Cheese, eggs, meats, vegetables, poultry Nutmeg-Baked goods, fruit, chicken, eggs, vegetables, desserts Onion Powder-Meat, fish, poultry, vegetables, cheese, eggs, bread, rice salads (Do not use   Onion salt) Orange Extract-Desserts, baked goods Oregano-Pasta, eggs, cheese, onions, pork, lamb, fish, chicken, vegetables, green salads Paprika-Meat, fish, poultry, eggs, cheese, vegetables Parsley Flakes-Butter, vegetables, meat fish, poultry, eggs, bread, salads (certain forms may   Contain  sodium Pepper-Meat fish, poultry, vegetables, eggs Peppermint Extract-Desserts, baked goods Poppy Seed-Eggs, bread, cheese, fruit dressings, baked goods, noodles, vegetables, cottage  [employer], poultry, meat, fish, cauliflower, turnips,eggs bread Saffron-Rice, bread, veal, chicken, fish, eggs Sage-Meat, fish, poultry, onions, eggplant, tomateos, pork, stews Savory-Eggs, salads, poultry, meat, rice, vegetables, soups, pork Tarragon-Meat, poultry, fish, eggs, butter, vegetables (licorice-like flavor)  Thyme-Meat, poultry, fish, eggs, vegetables, (clover-like flavor), sauces, soups Tumeric-Salads, butter, eggs, fish, rice, vegetables (saffron-like flavor) Vanilla Extract-Baked goods, candy Vinegar-Salads, vegetables, meat marinades Walnut Extract-baked goods, candy   2. Choose your Foods Wisely   The following is a list of foods to avoid which are high in sodium:  Meats-Avoid all smoked, canned, salt cured, dried and kosher meat and fish as well as Anchovies   [employer] meats:Bologna, Liverwurst, Pastrami Canned meat or fish  Marinated herring Caviar    Pepperoni Corned Beef   Pizza Dried chipped beef  Salami Frozen breaded fish or meat Salt pork Frankfurters or hot dogs  Sardines Gefilte fish   Sausage Ham (boiled ham, Proscuitto Smoked butt    spiced ham)   Spam      TV Dinners Vegetables Canned vegetables (Regular) Relish Canned mushrooms  Sauerkraut Olives    Tomato juice Pickles  Bakery and Dessert Products Canned puddings  Cream pies Cheesecake   Decorated cakes Cookies  Beverages/Juices Tomato juice, regular  Gatorade   V-8 vegetable juice, regular  Breads and Cereals Biscuit mixes   Salted potato chips, corn chips, pretzels Bread stuffing mixes  Salted crackers and rolls Pancake and waffle mixes Self-rising flour  Seasonings Accent    Meat sauces Barbecue sauce  Meat tenderizer Catsup    Monosodium  glutamate (MSG) Celery salt   Onion salt Chili sauce   Prepared mustard Garlic salt   Salt, seasoned salt, sea salt Gravy mixes   Soy sauce Horseradish   Steak sauce Ketchup   Tartar sauce Lite salt    Teriyaki sauce Marinade mixes   Worcestershire sauce  Others Baking powder   Cocoa and cocoa mixes Baking soda   Commercial casserole mixes Candy-caramels, chocolate  Dehydrated soups    Bars, fudge,nougats  Instant rice and pasta mixes Canned broth or soup  Maraschino cherries Cheese, aged and processed cheese and cheese spreads  Learning Assessment Quiz  Indicated T (for True) or F (for False) for each of the following statements:  _____ Fresh fruits and vegetables and unprocessed grains are generally low in sodium _____ Water may contain a considerable amount of sodium, depending on the source _____ You can always tell if a food is high in sodium by tasting it _____ Certain laxatives my be high in sodium and should be avoided unless prescribed   by a physician or pharmacist _____ Salt substitutes may be used freely by anyone on a sodium restricted diet _____ Sodium is present in table salt, food additives and as a natural component of   most foods _____ Table salt is approximately 90% sodium _____ Limiting sodium intake may help prevent excess fluid accumulation in the body _____ On a sodium-restricted diet, seasonings such as bouillon soy sauce, and    cooking wine should be used in place of table salt _____ On an ingredient list, a product which lists monosodium glutamate as the first   ingredient is an appropriate food to include on a low sodium diet  Circle the best answer(s) to the following statements (Hint: there may be more than one correct answer)  11. On a low-sodium diet, some acceptable snack items are:    A. Olives  F. Bean dip   K. Grapefruit juice    B. Salted Pretzels G. Commercial Popcorn   L. Canned peaches    C. Carrot Sticks  H. Bouillon   M. Unsalted  nuts   D. [place] fries  I. Peanut butter crackers N. Salami   E. Sweet pickles J. Tomato Juice   O. Pizza  12.  Seasonings that may be used freely on a reduced - sodium diet include   A. Lemon wedges F.Monosodium glutamate K. Celery seed    B.Soysauce   G. Pepper   L. Mustard powder   C. Sea salt  H. Cooking wine  M. Onion flakes   D. Vinegar  E. Prepared horseradish N. Salsa   E. Sage   J. Worcestershire sauce  O. Chutney

## 2023-07-05 DIAGNOSIS — I480 Paroxysmal atrial fibrillation: Secondary | ICD-10-CM | POA: Diagnosis not present

## 2023-07-05 DIAGNOSIS — I340 Nonrheumatic mitral (valve) insufficiency: Secondary | ICD-10-CM | POA: Diagnosis not present

## 2023-07-05 DIAGNOSIS — I671 Cerebral aneurysm, nonruptured: Secondary | ICD-10-CM | POA: Diagnosis not present

## 2023-07-05 DIAGNOSIS — I2510 Atherosclerotic heart disease of native coronary artery without angina pectoris: Secondary | ICD-10-CM | POA: Diagnosis not present

## 2023-07-05 DIAGNOSIS — F411 Generalized anxiety disorder: Secondary | ICD-10-CM | POA: Diagnosis not present

## 2023-07-05 DIAGNOSIS — I5032 Chronic diastolic (congestive) heart failure: Secondary | ICD-10-CM | POA: Diagnosis not present

## 2023-07-05 DIAGNOSIS — I7121 Aneurysm of the ascending aorta, without rupture: Secondary | ICD-10-CM | POA: Diagnosis not present

## 2023-07-05 DIAGNOSIS — E119 Type 2 diabetes mellitus without complications: Secondary | ICD-10-CM | POA: Diagnosis not present

## 2023-07-05 DIAGNOSIS — I110 Hypertensive heart disease with heart failure: Secondary | ICD-10-CM | POA: Diagnosis not present

## 2023-07-07 DIAGNOSIS — I5032 Chronic diastolic (congestive) heart failure: Secondary | ICD-10-CM | POA: Diagnosis not present

## 2023-07-07 DIAGNOSIS — E119 Type 2 diabetes mellitus without complications: Secondary | ICD-10-CM | POA: Diagnosis not present

## 2023-07-07 DIAGNOSIS — F411 Generalized anxiety disorder: Secondary | ICD-10-CM | POA: Diagnosis not present

## 2023-07-07 DIAGNOSIS — I480 Paroxysmal atrial fibrillation: Secondary | ICD-10-CM | POA: Diagnosis not present

## 2023-07-07 DIAGNOSIS — I110 Hypertensive heart disease with heart failure: Secondary | ICD-10-CM | POA: Diagnosis not present

## 2023-07-07 DIAGNOSIS — I340 Nonrheumatic mitral (valve) insufficiency: Secondary | ICD-10-CM | POA: Diagnosis not present

## 2023-07-07 DIAGNOSIS — I7121 Aneurysm of the ascending aorta, without rupture: Secondary | ICD-10-CM | POA: Diagnosis not present

## 2023-07-07 DIAGNOSIS — I2510 Atherosclerotic heart disease of native coronary artery without angina pectoris: Secondary | ICD-10-CM | POA: Diagnosis not present

## 2023-07-07 DIAGNOSIS — I671 Cerebral aneurysm, nonruptured: Secondary | ICD-10-CM | POA: Diagnosis not present

## 2023-07-10 DIAGNOSIS — K649 Unspecified hemorrhoids: Secondary | ICD-10-CM | POA: Diagnosis not present

## 2023-07-10 DIAGNOSIS — I480 Paroxysmal atrial fibrillation: Secondary | ICD-10-CM | POA: Diagnosis not present

## 2023-07-10 DIAGNOSIS — F411 Generalized anxiety disorder: Secondary | ICD-10-CM | POA: Diagnosis not present

## 2023-07-10 DIAGNOSIS — I110 Hypertensive heart disease with heart failure: Secondary | ICD-10-CM | POA: Diagnosis not present

## 2023-07-10 DIAGNOSIS — I2510 Atherosclerotic heart disease of native coronary artery without angina pectoris: Secondary | ICD-10-CM | POA: Diagnosis not present

## 2023-07-10 DIAGNOSIS — I4891 Unspecified atrial fibrillation: Secondary | ICD-10-CM | POA: Diagnosis not present

## 2023-07-10 DIAGNOSIS — I340 Nonrheumatic mitral (valve) insufficiency: Secondary | ICD-10-CM | POA: Diagnosis not present

## 2023-07-10 DIAGNOSIS — E119 Type 2 diabetes mellitus without complications: Secondary | ICD-10-CM | POA: Diagnosis not present

## 2023-07-10 DIAGNOSIS — F4321 Adjustment disorder with depressed mood: Secondary | ICD-10-CM | POA: Diagnosis not present

## 2023-07-10 DIAGNOSIS — I671 Cerebral aneurysm, nonruptured: Secondary | ICD-10-CM | POA: Diagnosis not present

## 2023-07-10 DIAGNOSIS — I7121 Aneurysm of the ascending aorta, without rupture: Secondary | ICD-10-CM | POA: Diagnosis not present

## 2023-07-10 DIAGNOSIS — I5032 Chronic diastolic (congestive) heart failure: Secondary | ICD-10-CM | POA: Diagnosis not present

## 2023-07-11 DIAGNOSIS — E119 Type 2 diabetes mellitus without complications: Secondary | ICD-10-CM | POA: Diagnosis not present

## 2023-07-11 DIAGNOSIS — F411 Generalized anxiety disorder: Secondary | ICD-10-CM | POA: Diagnosis not present

## 2023-07-11 DIAGNOSIS — I340 Nonrheumatic mitral (valve) insufficiency: Secondary | ICD-10-CM | POA: Diagnosis not present

## 2023-07-11 DIAGNOSIS — I2510 Atherosclerotic heart disease of native coronary artery without angina pectoris: Secondary | ICD-10-CM | POA: Diagnosis not present

## 2023-07-11 DIAGNOSIS — I480 Paroxysmal atrial fibrillation: Secondary | ICD-10-CM | POA: Diagnosis not present

## 2023-07-11 DIAGNOSIS — I5032 Chronic diastolic (congestive) heart failure: Secondary | ICD-10-CM | POA: Diagnosis not present

## 2023-07-11 DIAGNOSIS — I7121 Aneurysm of the ascending aorta, without rupture: Secondary | ICD-10-CM | POA: Diagnosis not present

## 2023-07-11 DIAGNOSIS — I110 Hypertensive heart disease with heart failure: Secondary | ICD-10-CM | POA: Diagnosis not present

## 2023-07-11 DIAGNOSIS — I671 Cerebral aneurysm, nonruptured: Secondary | ICD-10-CM | POA: Diagnosis not present

## 2023-07-12 DIAGNOSIS — I340 Nonrheumatic mitral (valve) insufficiency: Secondary | ICD-10-CM | POA: Diagnosis not present

## 2023-07-12 DIAGNOSIS — F411 Generalized anxiety disorder: Secondary | ICD-10-CM | POA: Diagnosis not present

## 2023-07-12 DIAGNOSIS — I7121 Aneurysm of the ascending aorta, without rupture: Secondary | ICD-10-CM | POA: Diagnosis not present

## 2023-07-12 DIAGNOSIS — I2510 Atherosclerotic heart disease of native coronary artery without angina pectoris: Secondary | ICD-10-CM | POA: Diagnosis not present

## 2023-07-12 DIAGNOSIS — I480 Paroxysmal atrial fibrillation: Secondary | ICD-10-CM | POA: Diagnosis not present

## 2023-07-12 DIAGNOSIS — I110 Hypertensive heart disease with heart failure: Secondary | ICD-10-CM | POA: Diagnosis not present

## 2023-07-12 DIAGNOSIS — I671 Cerebral aneurysm, nonruptured: Secondary | ICD-10-CM | POA: Diagnosis not present

## 2023-07-12 DIAGNOSIS — E119 Type 2 diabetes mellitus without complications: Secondary | ICD-10-CM | POA: Diagnosis not present

## 2023-07-12 DIAGNOSIS — I5032 Chronic diastolic (congestive) heart failure: Secondary | ICD-10-CM | POA: Diagnosis not present

## 2023-07-13 ENCOUNTER — Other Ambulatory Visit: Payer: Self-pay

## 2023-07-13 ENCOUNTER — Inpatient Hospital Stay (HOSPITAL_COMMUNITY)
Admission: EM | Admit: 2023-07-13 | Discharge: 2023-07-19 | DRG: 291 | Disposition: A | Discharge status: Skilled Nursing Facility | Source: Skilled Nursing Facility | Attending: Internal Medicine | Admitting: Internal Medicine

## 2023-07-13 ENCOUNTER — Encounter (HOSPITAL_COMMUNITY): Payer: Self-pay

## 2023-07-13 ENCOUNTER — Emergency Department (HOSPITAL_COMMUNITY)

## 2023-07-13 DIAGNOSIS — Z882 Allergy status to sulfonamides status: Secondary | ICD-10-CM

## 2023-07-13 DIAGNOSIS — K219 Gastro-esophageal reflux disease without esophagitis: Secondary | ICD-10-CM | POA: Diagnosis present

## 2023-07-13 DIAGNOSIS — R739 Hyperglycemia, unspecified: Secondary | ICD-10-CM | POA: Diagnosis not present

## 2023-07-13 DIAGNOSIS — N179 Acute kidney failure, unspecified: Secondary | ICD-10-CM | POA: Diagnosis not present

## 2023-07-13 DIAGNOSIS — Z833 Family history of diabetes mellitus: Secondary | ICD-10-CM | POA: Diagnosis not present

## 2023-07-13 DIAGNOSIS — I480 Paroxysmal atrial fibrillation: Secondary | ICD-10-CM | POA: Diagnosis not present

## 2023-07-13 DIAGNOSIS — Z800 Family history of malignant neoplasm of digestive organs: Secondary | ICD-10-CM

## 2023-07-13 DIAGNOSIS — Z886 Allergy status to analgesic agent status: Secondary | ICD-10-CM

## 2023-07-13 DIAGNOSIS — I2510 Atherosclerotic heart disease of native coronary artery without angina pectoris: Secondary | ICD-10-CM | POA: Diagnosis present

## 2023-07-13 DIAGNOSIS — M8580 Other specified disorders of bone density and structure, unspecified site: Secondary | ICD-10-CM | POA: Diagnosis present

## 2023-07-13 DIAGNOSIS — R41841 Cognitive communication deficit: Secondary | ICD-10-CM | POA: Diagnosis not present

## 2023-07-13 DIAGNOSIS — E1142 Type 2 diabetes mellitus with diabetic polyneuropathy: Secondary | ICD-10-CM | POA: Diagnosis not present

## 2023-07-13 DIAGNOSIS — I7121 Aneurysm of the ascending aorta, without rupture: Secondary | ICD-10-CM | POA: Diagnosis present

## 2023-07-13 DIAGNOSIS — R001 Bradycardia, unspecified: Secondary | ICD-10-CM | POA: Diagnosis not present

## 2023-07-13 DIAGNOSIS — Z79899 Other long term (current) drug therapy: Secondary | ICD-10-CM

## 2023-07-13 DIAGNOSIS — Z841 Family history of disorders of kidney and ureter: Secondary | ICD-10-CM | POA: Diagnosis not present

## 2023-07-13 DIAGNOSIS — I110 Hypertensive heart disease with heart failure: Principal | ICD-10-CM | POA: Diagnosis present

## 2023-07-13 DIAGNOSIS — Z794 Long term (current) use of insulin: Secondary | ICD-10-CM | POA: Diagnosis not present

## 2023-07-13 DIAGNOSIS — I517 Cardiomegaly: Secondary | ICD-10-CM | POA: Diagnosis not present

## 2023-07-13 DIAGNOSIS — E119 Type 2 diabetes mellitus without complications: Secondary | ICD-10-CM

## 2023-07-13 DIAGNOSIS — I071 Rheumatic tricuspid insufficiency: Secondary | ICD-10-CM | POA: Diagnosis present

## 2023-07-13 DIAGNOSIS — E1169 Type 2 diabetes mellitus with other specified complication: Secondary | ICD-10-CM | POA: Diagnosis not present

## 2023-07-13 DIAGNOSIS — R600 Localized edema: Secondary | ICD-10-CM | POA: Diagnosis not present

## 2023-07-13 DIAGNOSIS — Z7984 Long term (current) use of oral hypoglycemic drugs: Secondary | ICD-10-CM

## 2023-07-13 DIAGNOSIS — K2100 Gastro-esophageal reflux disease with esophagitis, without bleeding: Secondary | ICD-10-CM | POA: Diagnosis not present

## 2023-07-13 DIAGNOSIS — I509 Heart failure, unspecified: Secondary | ICD-10-CM | POA: Insufficient documentation

## 2023-07-13 DIAGNOSIS — E785 Hyperlipidemia, unspecified: Secondary | ICD-10-CM | POA: Diagnosis not present

## 2023-07-13 DIAGNOSIS — E8770 Fluid overload, unspecified: Secondary | ICD-10-CM | POA: Diagnosis not present

## 2023-07-13 DIAGNOSIS — Z8041 Family history of malignant neoplasm of ovary: Secondary | ICD-10-CM

## 2023-07-13 DIAGNOSIS — I5030 Unspecified diastolic (congestive) heart failure: Secondary | ICD-10-CM | POA: Diagnosis present

## 2023-07-13 DIAGNOSIS — I5033 Acute on chronic diastolic (congestive) heart failure: Secondary | ICD-10-CM | POA: Diagnosis present

## 2023-07-13 DIAGNOSIS — I10 Essential (primary) hypertension: Secondary | ICD-10-CM | POA: Diagnosis not present

## 2023-07-13 DIAGNOSIS — Z881 Allergy status to other antibiotic agents status: Secondary | ICD-10-CM

## 2023-07-13 DIAGNOSIS — I252 Old myocardial infarction: Secondary | ICD-10-CM

## 2023-07-13 DIAGNOSIS — R457 State of emotional shock and stress, unspecified: Secondary | ICD-10-CM | POA: Diagnosis not present

## 2023-07-13 DIAGNOSIS — R9389 Abnormal findings on diagnostic imaging of other specified body structures: Secondary | ICD-10-CM | POA: Diagnosis not present

## 2023-07-13 DIAGNOSIS — Z885 Allergy status to narcotic agent status: Secondary | ICD-10-CM

## 2023-07-13 DIAGNOSIS — Z860101 Personal history of adenomatous and serrated colon polyps: Secondary | ICD-10-CM

## 2023-07-13 DIAGNOSIS — R609 Edema, unspecified: Secondary | ICD-10-CM | POA: Diagnosis not present

## 2023-07-13 DIAGNOSIS — R0602 Shortness of breath: Secondary | ICD-10-CM | POA: Diagnosis not present

## 2023-07-13 DIAGNOSIS — D509 Iron deficiency anemia, unspecified: Secondary | ICD-10-CM | POA: Diagnosis present

## 2023-07-13 DIAGNOSIS — Z955 Presence of coronary angioplasty implant and graft: Secondary | ICD-10-CM | POA: Diagnosis not present

## 2023-07-13 DIAGNOSIS — E7800 Pure hypercholesterolemia, unspecified: Secondary | ICD-10-CM | POA: Diagnosis present

## 2023-07-13 DIAGNOSIS — R2689 Other abnormalities of gait and mobility: Secondary | ICD-10-CM | POA: Diagnosis not present

## 2023-07-13 DIAGNOSIS — F411 Generalized anxiety disorder: Secondary | ICD-10-CM | POA: Diagnosis not present

## 2023-07-13 DIAGNOSIS — Z7401 Bed confinement status: Secondary | ICD-10-CM | POA: Diagnosis not present

## 2023-07-13 DIAGNOSIS — Z85038 Personal history of other malignant neoplasm of large intestine: Secondary | ICD-10-CM | POA: Diagnosis not present

## 2023-07-13 DIAGNOSIS — E1140 Type 2 diabetes mellitus with diabetic neuropathy, unspecified: Secondary | ICD-10-CM

## 2023-07-13 DIAGNOSIS — Z880 Allergy status to penicillin: Secondary | ICD-10-CM

## 2023-07-13 DIAGNOSIS — Z7901 Long term (current) use of anticoagulants: Secondary | ICD-10-CM

## 2023-07-13 DIAGNOSIS — Z8619 Personal history of other infectious and parasitic diseases: Secondary | ICD-10-CM

## 2023-07-13 DIAGNOSIS — E8779 Other fluid overload: Principal | ICD-10-CM

## 2023-07-13 DIAGNOSIS — Z90710 Acquired absence of both cervix and uterus: Secondary | ICD-10-CM

## 2023-07-13 DIAGNOSIS — Z8249 Family history of ischemic heart disease and other diseases of the circulatory system: Secondary | ICD-10-CM | POA: Diagnosis not present

## 2023-07-13 DIAGNOSIS — M6281 Muscle weakness (generalized): Secondary | ICD-10-CM | POA: Diagnosis not present

## 2023-07-13 DIAGNOSIS — R0600 Dyspnea, unspecified: Secondary | ICD-10-CM | POA: Diagnosis not present

## 2023-07-13 DIAGNOSIS — R918 Other nonspecific abnormal finding of lung field: Secondary | ICD-10-CM | POA: Diagnosis not present

## 2023-07-13 DIAGNOSIS — R1312 Dysphagia, oropharyngeal phase: Secondary | ICD-10-CM | POA: Diagnosis not present

## 2023-07-13 DIAGNOSIS — I951 Orthostatic hypotension: Secondary | ICD-10-CM | POA: Diagnosis not present

## 2023-07-13 LAB — CBC WITH DIFFERENTIAL/PLATELET
Abs Immature Granulocytes: 0.03 10*3/uL (ref 0.00–0.07)
Basophils Absolute: 0.1 10*3/uL (ref 0.0–0.1)
Basophils Relative: 1 %
Eosinophils Absolute: 0.1 10*3/uL (ref 0.0–0.5)
Eosinophils Relative: 1 %
HCT: 36 % (ref 36.0–46.0)
Hemoglobin: 11.2 g/dL — ABNORMAL LOW (ref 12.0–15.0)
Immature Granulocytes: 0 %
Lymphocytes Relative: 39 %
Lymphs Abs: 3.7 10*3/uL (ref 0.7–4.0)
MCH: 24.5 pg — ABNORMAL LOW (ref 26.0–34.0)
MCHC: 31.1 g/dL (ref 30.0–36.0)
MCV: 78.8 fL — ABNORMAL LOW (ref 80.0–100.0)
Monocytes Absolute: 0.6 10*3/uL (ref 0.1–1.0)
Monocytes Relative: 6 %
Neutro Abs: 5 10*3/uL (ref 1.7–7.7)
Neutrophils Relative %: 53 %
Platelets: 224 10*3/uL (ref 150–400)
RBC: 4.57 MIL/uL (ref 3.87–5.11)
RDW: 16.6 % — ABNORMAL HIGH (ref 11.5–15.5)
WBC: 9.5 10*3/uL (ref 4.0–10.5)
nRBC: 0 % (ref 0.0–0.2)

## 2023-07-13 LAB — BASIC METABOLIC PANEL WITH GFR
Anion gap: 12 (ref 5–15)
BUN: 15 mg/dL (ref 8–23)
CO2: 24 mmol/L (ref 22–32)
Calcium: 8.9 mg/dL (ref 8.9–10.3)
Chloride: 104 mmol/L (ref 98–111)
Creatinine, Ser: 1.03 mg/dL — ABNORMAL HIGH (ref 0.44–1.00)
GFR, Estimated: 52 mL/min — ABNORMAL LOW (ref >60)
Glucose, Bld: 178 mg/dL — ABNORMAL HIGH (ref 70–99)
Potassium: 4.1 mmol/L (ref 3.5–5.1)
Sodium: 140 mmol/L (ref 135–145)

## 2023-07-13 LAB — BRAIN NATRIURETIC PEPTIDE: B Natriuretic Peptide: 586.7 pg/mL — ABNORMAL HIGH (ref 0.0–100.0)

## 2023-07-13 LAB — GLUCOSE, CAPILLARY: Glucose-Capillary: 116 mg/dL — ABNORMAL HIGH (ref 70–99)

## 2023-07-13 MED ORDER — AMIODARONE HCL 200 MG PO TABS
200.0000 mg | ORAL_TABLET | Freq: Every day | ORAL | Status: DC
  Administered 2023-07-14 – 2023-07-19 (×6): 200 mg via ORAL
  Filled 2023-07-13 (×6): qty 1

## 2023-07-13 MED ORDER — PANTOPRAZOLE SODIUM 40 MG PO TBEC
40.0000 mg | DELAYED_RELEASE_TABLET | Freq: Every day | ORAL | Status: DC
  Administered 2023-07-14 – 2023-07-19 (×6): 40 mg via ORAL
  Filled 2023-07-13 (×6): qty 1

## 2023-07-13 MED ORDER — INSULIN ASPART 100 UNIT/ML IJ SOLN: 0.0000 [IU] | Freq: Every day | INTRAMUSCULAR | Status: DC

## 2023-07-13 MED ORDER — POLYETHYLENE GLYCOL 3350 17 G PO PACK: 17.0000 g | PACK | Freq: Every day | ORAL | Status: DC | PRN

## 2023-07-13 MED ORDER — FUROSEMIDE 10 MG/ML IJ SOLN
40.0000 mg | Freq: Once | INTRAMUSCULAR | Status: AC
  Administered 2023-07-13: 40 mg via INTRAVENOUS
  Filled 2023-07-13: qty 4

## 2023-07-13 MED ORDER — INSULIN ASPART 100 UNIT/ML IJ SOLN
0.0000 [IU] | Freq: Three times a day (TID) | INTRAMUSCULAR | Status: DC
  Administered 2023-07-14: 3 [IU] via SUBCUTANEOUS
  Administered 2023-07-14: 2 [IU] via SUBCUTANEOUS
  Administered 2023-07-15: 5 [IU] via SUBCUTANEOUS
  Administered 2023-07-15: 3 [IU] via SUBCUTANEOUS
  Administered 2023-07-15 – 2023-07-16 (×2): 2 [IU] via SUBCUTANEOUS
  Administered 2023-07-16: 3 [IU] via SUBCUTANEOUS
  Administered 2023-07-16 – 2023-07-17 (×2): 2 [IU] via SUBCUTANEOUS
  Administered 2023-07-17: 3 [IU] via SUBCUTANEOUS
  Administered 2023-07-18: 2 [IU] via SUBCUTANEOUS
  Administered 2023-07-18: 3 [IU] via SUBCUTANEOUS
  Administered 2023-07-19: 2 [IU] via SUBCUTANEOUS
  Administered 2023-07-19: 3 [IU] via SUBCUTANEOUS

## 2023-07-13 MED ORDER — SODIUM CHLORIDE 0.9 % IV SOLN: 250.0000 mL | INTRAVENOUS | Status: AC | PRN

## 2023-07-13 MED ORDER — FUROSEMIDE 10 MG/ML IJ SOLN
40.0000 mg | Freq: Two times a day (BID) | INTRAMUSCULAR | Status: DC
  Administered 2023-07-14: 40 mg via INTRAVENOUS
  Filled 2023-07-13: qty 4

## 2023-07-13 MED ORDER — ONDANSETRON HCL 4 MG/2ML IJ SOLN: 4.0000 mg | Freq: Four times a day (QID) | INTRAMUSCULAR | Status: DC | PRN

## 2023-07-13 MED ORDER — SPIRONOLACTONE 12.5 MG HALF TABLET
12.5000 mg | ORAL_TABLET | Freq: Every day | ORAL | Status: DC
  Administered 2023-07-14 – 2023-07-19 (×5): 12.5 mg via ORAL
  Filled 2023-07-13 (×6): qty 1

## 2023-07-13 MED ORDER — METOPROLOL TARTRATE 25 MG PO TABS
25.0000 mg | ORAL_TABLET | Freq: Two times a day (BID) | ORAL | Status: DC
  Administered 2023-07-13 – 2023-07-14 (×3): 25 mg via ORAL
  Filled 2023-07-13 (×4): qty 1

## 2023-07-13 MED ORDER — SODIUM CHLORIDE 0.9% FLUSH: 3.0000 mL | INTRAVENOUS | Status: DC | PRN

## 2023-07-13 MED ORDER — INSULIN GLARGINE-YFGN 100 UNIT/ML ~~LOC~~ SOLN
18.0000 [IU] | Freq: Every day | SUBCUTANEOUS | Status: DC
  Administered 2023-07-14 – 2023-07-19 (×6): 18 [IU] via SUBCUTANEOUS
  Filled 2023-07-13 (×6): qty 0.18

## 2023-07-13 MED ORDER — LORAZEPAM 1 MG PO TABS
0.5000 mg | ORAL_TABLET | Freq: Once | ORAL | Status: AC
  Administered 2023-07-13: 0.5 mg via ORAL
  Filled 2023-07-13: qty 1

## 2023-07-13 MED ORDER — ONDANSETRON HCL 4 MG PO TABS
4.0000 mg | ORAL_TABLET | Freq: Three times a day (TID) | ORAL | Status: DC | PRN
  Administered 2023-07-16: 4 mg via ORAL
  Filled 2023-07-13: qty 1

## 2023-07-13 MED ORDER — ACETAMINOPHEN 500 MG PO TABS
500.0000 mg | ORAL_TABLET | Freq: Four times a day (QID) | ORAL | Status: DC | PRN
  Administered 2023-07-14 – 2023-07-19 (×11): 500 mg via ORAL
  Filled 2023-07-13 (×11): qty 1

## 2023-07-13 MED ORDER — APIXABAN 5 MG PO TABS
5.0000 mg | ORAL_TABLET | Freq: Two times a day (BID) | ORAL | Status: DC
  Administered 2023-07-13 – 2023-07-19 (×12): 5 mg via ORAL
  Filled 2023-07-13 (×12): qty 1

## 2023-07-13 MED ORDER — HYDRALAZINE HCL 10 MG PO TABS
10.0000 mg | ORAL_TABLET | Freq: Three times a day (TID) | ORAL | Status: DC
  Administered 2023-07-13 – 2023-07-19 (×17): 10 mg via ORAL
  Filled 2023-07-13 (×17): qty 1

## 2023-07-13 MED ORDER — ALUM & MAG HYDROXIDE-SIMETH 200-200-20 MG/5ML PO SUSP: 15.0000 mL | Freq: Two times a day (BID) | ORAL | Status: DC | PRN

## 2023-07-13 MED ORDER — MELATONIN 3 MG PO TABS
3.0000 mg | ORAL_TABLET | Freq: Every day | ORAL | Status: DC
  Administered 2023-07-13 – 2023-07-18 (×6): 3 mg via ORAL
  Filled 2023-07-13 (×6): qty 1

## 2023-07-13 MED ORDER — BUSPIRONE HCL 5 MG PO TABS
5.0000 mg | ORAL_TABLET | Freq: Two times a day (BID) | ORAL | Status: DC
  Administered 2023-07-14 – 2023-07-19 (×11): 5 mg via ORAL
  Filled 2023-07-13 (×11): qty 1

## 2023-07-13 MED ORDER — ADULT MULTIVITAMIN W/MINERALS CH
1.0000 | ORAL_TABLET | Freq: Every morning | ORAL | Status: DC
  Administered 2023-07-14 – 2023-07-19 (×6): 1 via ORAL
  Filled 2023-07-13 (×6): qty 1

## 2023-07-13 MED ORDER — SODIUM CHLORIDE 0.9% FLUSH
3.0000 mL | Freq: Two times a day (BID) | INTRAVENOUS | Status: DC
  Administered 2023-07-13 – 2023-07-19 (×8): 3 mL via INTRAVENOUS

## 2023-07-13 NOTE — H&P (Signed)
 History and Physical    Patient: [name] [id number] DOB: 1933-01-08 DOA: 07/13/2023 DOS: the patient was seen and examined on 07/13/2023 PCP: [facility]  Patient coming from: SN[facility]  Chief Complaint:  Chief Complaint  Patient presents with   SOB   Bil Leg Swelling   Anxious   HPI: [name] is a 89 y.o. female with medical history significant of ascending aortic aneurysm, bicuspid aortic valve, anxiety disorder, carotid artery stenosis, type 2 diabetes, hiatal hernia, suspected coronary artery disease, peripheral neuropathy, atrial fibrillation iron deficiency anemia, who is a resident at [facility] brought in with shortness of breath and worsening lower extremity edema.  Patient reported that she believes she has been taking her medicine but no relief.  Denied any chest pain.  Denied any fever or chills.  She does have exertional dyspnea on the 2+ pedal edema.  Patient's symptoms started in April when she lost her husband of 56 years.  Since then she has had increased anxiety but then associated with the shortness of breath.  In the ER he received a dose of Lasix .  She does have some PND and orthopnea.  Patient has been admitted with acute exacerbation of diastolic CHF.  She had an echocardiogram recently that showed normal EF.  Review of Systems: As mentioned in the history of present illness. All other systems reviewed and are negative. Past Medical History:  Diagnosis Date   Abdominal pain, chronic, left lower quadrant    Acute torn meniscus of knee    Adenomatous colon polyp 1970   carcinoma in situ   Allergic rhinitis    Amaurosis fugax 08/07/2012   Anxiety    Ascending aortic aneurysm (HCC) 12/07/2015   44mm by chest CTA 08/2020   Benign essential tremor syndrome    Bicuspid aortic valve    no AS on 07/2019   Carotid stenosis    1039 bilateral by dopplers 03/2017.    colon ca dx'd 1970   surg only   Coronary artery calcification seen on CT scan  09/26/2022   - Cath in 2002 normal - Myoview in 2017 low risk - Admx in 12/2021 w AF w RVR and elevated hsT c/w NSTEMI - pt preferred conservative mgmt (no cath)    DDD (degenerative disc disease)    Diverticulitis    Diverticulosis    DM (diabetes mellitus) (HCC)    Duodenitis    peptic, with gastric heterotopia   Endometriosis    s/p hysterectomy   Fundic gland polyposis of stomach    GERD (gastroesophageal reflux disease)    Heart murmur    Hiatal hernia 02/03/2005   History of cerebral aneurysm repair    s/p coiling   History of hemorrhoids    with bleeding   History of shingles    HLD (hyperlipidemia)    HTN (hypertension)    Hypercholesteremia    IBS (irritable bowel syndrome)    Iron deficiency    Lung nodule 07/12/2021   CT 05/2021: 3 mm right solid pulmonary nodule-no routine follow-up imaging recommended   Migraine    MVP (mitral valve prolapse)    Osteopenia    Peripheral neuropathy    Toe fracture, right    second toe   UTI (lower urinary tract infection)    Varicose vein    Past Surgical History:  Procedure Laterality Date   ABDOMINAL HYSTERECTOMY     APPENDECTOMY     CARPAL TUNNEL RELEASE  08/26/07   CATARACT EXTRACTION  CEREBRAL ANEURYSM REPAIR     COLON RESECTION     COLONOSCOPY  06/07/08   divertiulosis, internal hemorrhoids   COLONOSCOPY W/ BIOPSIES AND POLYPECTOMY  02/03/05   diverticulosis, 4 mm sessile polyps, internal and external hemorrhoids   CORONARY ANGIOPLASTY WITH STENT PLACEMENT     ESOPHAGOGASTRODUODENOSCOPY  02/03/05   hiatal hernia, 6 benign gastric polyps   FLEXIBLE SIGMOIDOSCOPY     HAND SURGERY Right    INTRAOCULAR LENS INSERTION     right hand decompressive fasciotomy  08/26/07   , dorsal and volar   TONSILLECTOMY     Social History:  reports that she has never smoked. She has never used smokeless tobacco. She reports that she does not drink alcohol and does not use drugs.  Allergies  Allergen Reactions   Amoxicillin  Other  (See Comments)    Stomach upset  Other Reaction(s): severe pain in side   Fosamax [Alendronate Sodium] Other (See Comments)    Unknown reaction   Ms Contin  Glynn.Gleason ] Other (See Comments)    Body spasms    Pioglitazone Other (See Comments)    Chronic pedal edema  Other Reaction(s): (chronic pedal edema)   Rosiglitazone Other (See Comments)    Chronic pedal edema  Other Reaction(s): (chronic pedal edema)   Ultram  [Tramadol ] Shortness Of Breath and Palpitations   Atorvastatin Swelling    Myalgias  Other Reaction(s): myalgias   Glimepiride Other (See Comments)    Unknown reaction  Other Reaction(s): low blood sugars   Linagliptin Other (See Comments)    GI issues  Other Reaction(s): GI upset   Metformin And Related Other (See Comments)    Gastritis    Repaglinide Other (See Comments)    Abdominal pain  Other Reaction(s): abdominal pain   Sitagliptin Other (See Comments)    Unknown reaction  Other Reaction(s): gastritis   Zoloft [Sertraline Hcl] Other (See Comments)    Altered mental state   Aspirin  Other (See Comments)    Bleeding ulcers  Other Reaction(s): stomach ulcers   Bentyl [Dicyclomine Hcl] Other (See Comments)    Near syncope Weakness    Cephalexin Other (See Comments)    Unknown reaction  Other Reaction(s): unknown   Cipro  [Ciprofloxacin  Hcl] Nausea And Vomiting and Other (See Comments)    Stomach upset   Ciprofloxacin  Nausea And Vomiting    Severe stomach upset  Other Reaction(s): unknown   Dicyclomine     Other reaction(s): Unknown  Other Reaction(s): unknown   Hctz [Hydrochlorothiazide ] Other (See Comments)    Urinary issues   Morphine  And Codeine Other (See Comments)    Body spasms   Norvasc  [Amlodipine  Besylate] Other (See Comments)    Weakness Dizziness    Oxycontin [Oxycodone] Other (See Comments)    Unknown reaction   Penicillin G     Other Reaction(s): unknown   Sulfa Antibiotics Hives    Other Reaction(s): welps   Penicillins  Swelling and Rash   Sulfamethoxazole-Trimethoprim Hives and Rash    Family History  Problem Relation Age of Onset   Ovarian cancer Mother    Heart disease Father    Kidney disease Father    Diabetes Paternal Grandmother    Diabetes Brother    Hypertension Brother    Heart disease Brother    Diabetes Brother    Heart disease Brother    Microcephaly Brother    Diabetes Other    Colon cancer Other     Prior to Admission medications   Medication Sig Start Date  End Date Taking? Authorizing Provider  acetaminophen  (TYLENOL ) 500 MG tablet Take 500 mg by mouth every 6 (six) hours as needed for mild pain, moderate pain, fever or headache.    [provider]  aluminum -magnesium  hydroxide 200-200 MG/5ML suspension Take 15 mLs by mouth as needed for indigestion. 06/23/23   Magdalene School, MD  amiodarone  (PACERONE ) 200 MG tablet Take 1 tablet (200 mg total) by mouth daily. 07/04/23   Flo Hummingbird, PA-C  apixaban  (ELIQUIS ) 5 MG TABS tablet Take 1 tablet (5 mg total) by mouth 2 (two) times daily. 12/05/22   Jacqueline Matsu, MD  busPIRone (BUSPAR) 5 MG tablet Take 5 mg by mouth 2 (two) times daily.    [provider]  docusate sodium  (COLACE) 100 MG capsule Take 100 mg by mouth daily as needed for moderate constipation.    [provider]  furosemide  (LASIX ) 20 MG tablet Take one tab every Mon, Wed, Fri. Take one additional tab if weight gain of 3+ lbs overnight 05/20/23   Danford, Willis Harter, MD  hydrALAZINE  (APRESOLINE ) 10 MG tablet Take 1 tablet (10 mg total) by mouth 3 (three) times daily. 07/04/23   Flo Hummingbird, PA-C  insulin  glargine (LANTUS ) 100 UNIT/ML Solostar Pen Inject 18 Units into the skin in the morning. 05/20/23   Danford, Willis Harter, MD  melatonin 3 MG TABS tablet Take 3 mg by mouth at bedtime.    [provider]  metoprolol  tartrate (LOPRESSOR ) 25 MG tablet Take 1 tablet (25 mg total) by mouth 2 (two) times daily. 05/20/23   Danford,  Willis Harter, MD  Multiple Vitamin (MULTIVITAMIN) tablet Take 1 tablet by mouth in the morning.    [provider]  ondansetron  (ZOFRAN ) 4 MG tablet Take 1 tablet (4 mg total) by mouth every 8 (eight) hours as needed for nausea or vomiting. 05/20/23   Danford, Willis Harter, MD  pantoprazole  (PROTONIX ) 40 MG tablet TAKE 1 TABLET BY MOUTH EVERY DAY BEFORE BREAKFAST 02/03/20   Kenney Peacemaker, MD  polyethylene glycol (MIRALAX  / GLYCOLAX ) 17 g packet Take 17 g by mouth daily as needed for moderate constipation. 05/20/23   Danford, Willis Harter, MD  spironolactone  (ALDACTONE ) 25 MG tablet Take 0.5 tablets (12.5 mg total) by mouth daily. 06/05/23   Jacqueline Matsu, MD    Physical Exam: Vitals:   07/13/23 1800 07/13/23 1815 07/13/23 1830 07/13/23 1935  BP: (!) 157/120 (!) 173/81 (!) 187/176   Pulse: (!) 142 (!) 54 (!) 57   Resp: (!) 25 18 17    Temp:      TempSrc:      SpO2: 95% 98% 98% 100%  Weight:      Height:       Constitutional: Acutely ill looking, NAD, calm, comfortable Eyes: PERRL, lids and conjunctivae normal ENMT: Mucous membranes are moist. Posterior pharynx clear of any exudate or lesions.Normal dentition.  Neck: normal, supple, no masses, no thyromegaly Respiratory: Coarse breath sound with some basal crackles bilaterally, no wheezing. Normal respiratory effort. No accessory muscle use.  Cardiovascular: Regular rate and rhythm, no murmurs / rubs / gallops. 2 + extremity edema. 2+ pedal pulses. No carotid bruits.  Abdomen: no tenderness, no masses palpated. No hepatosplenomegaly. Bowel sounds positive.  Musculoskeletal: Good range of motion, no joint swelling or tenderness, Skin: no rashes, lesions, ulcers. No induration Neurologic: CN 2-12 grossly intact. Sensation intact, DTR normal. Strength 5/5 in all 4.  Psychiatric: Normal judgment and insight. Alert and oriented x 3.  Anxious mood  Data Reviewed:  Temperature 98.2, blood pressure 204/58, pulse 142 respirate 32,  oxygen sat 95% on room air.  Glucose 178 creatinine 1.03 the rest of the chemistry within normal.  BNP 586 hemoglobin 11.2 chest x-ray-showed retrocardiac airspace consolidation probably atelectasis or developing pneumonia.  EKG showed atrial flutter fibrillation with controlled rate  Assessment and Plan:  #1 acute exacerbation of CHF: Diastolic CHF.  Patient will be admitted.  Initiate diuresis, continue other cardiac medications including beta-blockers.  Consider cardiology consult.  #2 atrial fibrillation: Rate controlled.  Continue beta-blockers with anticoagulation.  #3 non-insulin -dependent diabetes: Will initiate sliding scale insulin .  Continue to monitor glucose AC and at bedtime  #4 essential hypertension: Continue home regimen.  Blood pressure appears controlled  #5 GERD: Will continue PPIs.  #6 anxiety disorder: Continue anxiolytics.    Advance Care Planning:   Code Status: Full Code   Consults: None but may consider cardiac cath in the morning.  Family Communication: No family at bedside  Severity of Illness: The appropriate patient status for this patient is INPATIENT. Inpatient status is judged to be reasonable and necessary in order to provide the required intensity of service to ensure the patient's safety. The patient's presenting symptoms, physical exam findings, and initial radiographic and laboratory data in the context of their chronic comorbidities is felt to place them at high risk for further clinical deterioration. Furthermore, it is not anticipated that the patient will be medically stable for discharge from the hospital within 2 midnights of admission.   * I certify that at the point of admission it is my clinical judgment that the patient will require inpatient hospital care spanning beyond 2 midnights from the point of admission due to high intensity of service, high risk for further deterioration and high frequency of surveillance  required.*  AuthorCarolin Chyle, MD 07/13/2023 8:12 PM  For on call review [email or web address].

## 2023-07-13 NOTE — ED Triage Notes (Signed)
 Pt BIB [facility] from [name] d/t recent development of SOB, worsening swelling in bil lower legs plus worsening anxiety. EMS reports pt has had a lot going on lately with losing her husband, recent falls, moving into a new place, etc. Upon EMS arrival she is A/Ox4, anxious, rales in bil lower lobes, 95% on RA. Pt reports having to sit up to sleep at night, & denies missing any recent doses of her meds for her DM, A-Fib & HTN. Rates leg pain 6/10, 200/80, 60 bpm & last CBG was 332.

## 2023-07-13 NOTE — ED Provider Notes (Signed)
 [place] EMERGENCY DEPARTMENT AT [facility] Provider Note  CSN: [id number] Arrival date & time: 07/13/23 1543  Chief Complaint(s) SOB, Bil Leg Swelling, and Anxious  HPI [name] is a 89 y.o. female here today for lower extremity and shortness of breath.  Patient has a history of atrial fibrillation, heart failure, takes furosemide  and spironolactone .  Patient also reports feeling anxious.  She recently lost her husband, and has reported that at that is caused increased anxiety.  She states she has been unable to lay down to sleep due to what she believes to be increased fluid.   Past Medical History Past Medical History:  Diagnosis Date   Abdominal pain, chronic, left lower quadrant    Acute torn meniscus of knee    Adenomatous colon polyp 1970   carcinoma in situ   Allergic rhinitis    Amaurosis fugax 08/07/2012   Anxiety    Ascending aortic aneurysm (HCC) 12/07/2015   44mm by chest CTA 08/2020   Benign essential tremor syndrome    Bicuspid aortic valve    no AS on 07/2019   Carotid stenosis    1039 bilateral by dopplers 03/2017.    colon ca dx'd 1970   surg only   Coronary artery calcification seen on CT scan 09/26/2022   - Cath in 2002 normal - Myoview in 2017 low risk - Admx in 12/2021 w AF w RVR and elevated hsT c/w NSTEMI - pt preferred conservative mgmt (no cath)    DDD (degenerative disc disease)    Diverticulitis    Diverticulosis    DM (diabetes mellitus) (HCC)    Duodenitis    peptic, with gastric heterotopia   Endometriosis    s/p hysterectomy   Fundic gland polyposis of stomach    GERD (gastroesophageal reflux disease)    Heart murmur    Hiatal hernia 02/03/2005   History of cerebral aneurysm repair    s/p coiling   History of hemorrhoids    with bleeding   History of shingles    HLD (hyperlipidemia)    HTN (hypertension)    Hypercholesteremia    IBS (irritable bowel syndrome)    Iron deficiency    Lung nodule 07/12/2021   CT  05/2021: 3 mm right solid pulmonary nodule-no routine follow-up imaging recommended   Migraine    MVP (mitral valve prolapse)    Osteopenia    Peripheral neuropathy    Toe fracture, right    second toe   UTI (lower urinary tract infection)    Varicose vein    Patient Active Problem List   Diagnosis Date Noted   Primary hypertension 06/22/2023   Acute on chronic diastolic heart failure (HCC) 06/21/2023   Atrial fibrillation with rapid ventricular response (HCC) 06/18/2023   Atrial fibrillation with RVR (HCC) 05/19/2023   Hypomagnesemia 05/19/2023   Gastroenteritis 05/19/2023   Coronary artery calcification seen on CT scan 09/26/2022   Mitral regurgitation 09/26/2022   Acute hypoxemic respiratory failure (HCC) 02/03/2022   Hypertensive urgency 02/03/2022   (HFpEF) heart failure with preserved ejection fraction (HCC) 02/03/2022   Nausea and vomiting 02/03/2022   Elevated troponin 01/02/2022   Hyperlipidemia 01/02/2022   Old MI (myocardial infarction) 01/02/2022   Paroxysmal atrial fibrillation (HCC) 01/01/2022   Lung nodule 07/12/2021   Weakness 06/30/2021   Ankle edema, bilateral 01/21/2021   Sprain of left rotator cuff capsule 01/21/2021   Cervical radiculopathy due to degenerative joint disease of spine 01/21/2021   Left wrist pain  08/06/2019   Bicuspid aortic valve    Constipation 11/14/2017   Rectal bleeding 11/14/2017   Carotid stenosis    Heart palpitations 03/23/2017   Ascending aortic aneurysm (HCC) 12/07/2015   SOB (shortness of breath) 12/07/2015   Orthostatic hypotension 04/02/2014   Hematochezia 04/02/2014   Chest pain    Near syncope 04/01/2014   DM (diabetes mellitus) (HCC) 04/01/2014   Diverticulosis 04/01/2014   Unruptured cerebral aneurysm 01/08/2014   Multifactorial gait disorder 01/08/2014   Aneurysm, cerebral, nonruptured 08/07/2012   Amaurosis fugax 08/07/2012   Hereditary and idiopathic peripheral neuropathy 08/07/2012   Abnormal x-ray of lumbar  spine 08/29/2010   Low back pain 08/24/2010   Anxiety state 03/26/2010   Essential hypertension 03/26/2010   Bilateral lower abdominal pain 03/26/2010   GERD 12/22/2008   Diarrhea 12/22/2008   IRON DEFICIENCY 05/22/2008   Irritable bowel syndrome 04/07/2008   History of colonic polyps 04/07/2008   Home Medication(s) Prior to Admission medications   Medication Sig Start Date End Date Taking? Authorizing Provider  acetaminophen  (TYLENOL ) 500 MG tablet Take 500 mg by mouth every 6 (six) hours as needed for mild pain, moderate pain, fever or headache.    [provider]  aluminum -magnesium  hydroxide 200-200 MG/5ML suspension Take 15 mLs by mouth as needed for indigestion. 06/23/23   Magdalene School, MD  amiodarone  (PACERONE ) 200 MG tablet Take 1 tablet (200 mg total) by mouth daily. 07/04/23   Flo Hummingbird, PA-C  apixaban  (ELIQUIS ) 5 MG TABS tablet Take 1 tablet (5 mg total) by mouth 2 (two) times daily. 12/05/22   Jacqueline Matsu, MD  busPIRone (BUSPAR) 5 MG tablet Take 5 mg by mouth 2 (two) times daily.    [provider]  docusate sodium  (COLACE) 100 MG capsule Take 100 mg by mouth daily as needed for moderate constipation.    [provider]  furosemide  (LASIX ) 20 MG tablet Take one tab every Mon, Wed, Fri. Take one additional tab if weight gain of 3+ lbs overnight 05/20/23   Danford, Willis Harter, MD  hydrALAZINE  (APRESOLINE ) 10 MG tablet Take 1 tablet (10 mg total) by mouth 3 (three) times daily. 07/04/23   Flo Hummingbird, PA-C  insulin  glargine (LANTUS ) 100 UNIT/ML Solostar Pen Inject 18 Units into the skin in the morning. 05/20/23   Danford, Willis Harter, MD  melatonin 3 MG TABS tablet Take 3 mg by mouth at bedtime.    [provider]  metoprolol  tartrate (LOPRESSOR ) 25 MG tablet Take 1 tablet (25 mg total) by mouth 2 (two) times daily. 05/20/23   Danford, Willis Harter, MD  Multiple Vitamin (MULTIVITAMIN) tablet Take 1 tablet by mouth in the morning.     [provider]  ondansetron  (ZOFRAN ) 4 MG tablet Take 1 tablet (4 mg total) by mouth every 8 (eight) hours as needed for nausea or vomiting. 05/20/23   Danford, Willis Harter, MD  pantoprazole  (PROTONIX ) 40 MG tablet TAKE 1 TABLET BY MOUTH EVERY DAY BEFORE BREAKFAST 02/03/20   Kenney Peacemaker, MD  polyethylene glycol (MIRALAX  / GLYCOLAX ) 17 g packet Take 17 g by mouth daily as needed for moderate constipation. 05/20/23   Danford, Willis Harter, MD  spironolactone  (ALDACTONE ) 25 MG tablet Take 0.5 tablets (12.5 mg total) by mouth daily. 06/05/23   Jacqueline Matsu, MD  Past Surgical History Past Surgical History:  Procedure Laterality Date   ABDOMINAL HYSTERECTOMY     APPENDECTOMY     CARPAL TUNNEL RELEASE  08/26/07   CATARACT EXTRACTION     CEREBRAL ANEURYSM REPAIR     COLON RESECTION     COLONOSCOPY  06/07/08   divertiulosis, internal hemorrhoids   COLONOSCOPY W/ BIOPSIES AND POLYPECTOMY  02/03/05   diverticulosis, 4 mm sessile polyps, internal and external hemorrhoids   CORONARY ANGIOPLASTY WITH STENT PLACEMENT     ESOPHAGOGASTRODUODENOSCOPY  02/03/05   hiatal hernia, 6 benign gastric polyps   FLEXIBLE SIGMOIDOSCOPY     HAND SURGERY Right    INTRAOCULAR LENS INSERTION     right hand decompressive fasciotomy  08/26/07   , dorsal and volar   TONSILLECTOMY     Family History Family History  Problem Relation Age of Onset   Ovarian cancer Mother    Heart disease Father    Kidney disease Father    Diabetes Paternal Grandmother    Diabetes Brother    Hypertension Brother    Heart disease Brother    Diabetes Brother    Heart disease Brother    Microcephaly Brother    Diabetes Other    Colon cancer Other     Social History Social History   Tobacco Use   Smoking status: Never   Smokeless tobacco: Never  Vaping Use   Vaping status: Never Used   Substance Use Topics   Alcohol use: No   Drug use: No   Allergies Amoxicillin , Fosamax [alendronate sodium], Ms contin  [morphine ], Pioglitazone, Rosiglitazone, Ultram  [tramadol ], Atorvastatin, Glimepiride, Linagliptin, Metformin and related, Repaglinide, Sitagliptin, Zoloft [sertraline hcl], Aspirin , Bentyl [dicyclomine hcl], Cephalexin, Cipro  [ciprofloxacin  hcl], Ciprofloxacin , Dicyclomine, Hctz [hydrochlorothiazide ], Morphine  and codeine, Norvasc  [amlodipine  besylate], Oxycontin [oxycodone], Penicillin g, Sulfa antibiotics, Penicillins, and Sulfamethoxazole-trimethoprim  Review of Systems Review of Systems  Physical Exam Vital Signs  I have reviewed the triage vital signs BP (!) 167/90 (BP Location: Left Arm)   Pulse (!) 49   Temp 98.2 F (36.8 C) (Oral)   Resp (!) 24   Ht 5' 4 (1.626 m)   Wt 76.7 kg   SpO2 97%   BMI 29.01 kg/m   Physical Exam Vitals and nursing note reviewed.  Constitutional:      Appearance: She is not toxic-appearing.  HENT:     Head: Normocephalic and atraumatic.   Cardiovascular:     Rate and Rhythm: Normal rate. Rhythm irregular.  Pulmonary:     Effort: Pulmonary effort is normal.     Breath sounds: Rales present.  Abdominal:     General: Abdomen is flat.     Palpations: Abdomen is soft.   Musculoskeletal:     Comments: 2+ lower extremity edema bilaterally   Skin:    Comments: No redness, no erythema of the lower extremities   Neurological:     General: No focal deficit present.     Mental Status: She is alert.     ED Results and Treatments Labs (all labs ordered are listed, but only abnormal results are displayed) Labs Reviewed  BRAIN NATRIURETIC PEPTIDE  CBC WITH DIFFERENTIAL/PLATELET  BASIC METABOLIC PANEL WITH GFR  Radiology No results found.  Pertinent labs & imaging results that were available during  my care of the patient were reviewed by me and considered in my medical decision making (see MDM for details).  Medications Ordered in ED Medications - No data to display                                                                                                                                   Procedures Procedures  (including critical care time)  Medical Decision Making / ED Course   This patient presents to the ED for concern of leg swelling and shortness of breath, this involves an extensive number of treatment options, and is a complaint that carries with it a high risk of complications and morbidity.  The differential diagnosis includes heart failure, fluid overload, pulmonary edema, less likely DVT.  MDM: On exam, patient not on any supplemental O2.  Is mildly tachypneic.  Does have fairly significant lower extremity edema.  Will check a BNP, chest x-ray on the patient.  Basic blood work ordered.  EKG ordered.  Reassessment 6:05 PM-patient's chest x-ray negative, question of atelectasis versus early pneumonia, or think is likely atelectasis.  Her BNP is up 200 points to previous.  She is having more difficulty with ambulation.  Do think patient would benefit from additional diuresis, medical management before returning to her skilled nursing facility.  Patient is recently moved into the skilled nursing facility, and I think there is a bit of an adjustment.    Additional history obtained:  -External records from outside source obtained and reviewed including: Chart review including previous notes, labs, imaging, consultation notes   Lab Tests: -I ordered, reviewed, and interpreted labs.   The pertinent results include:   Labs Reviewed  BRAIN NATRIURETIC PEPTIDE  CBC WITH DIFFERENTIAL/PLATELET  BASIC METABOLIC PANEL WITH GFR      EKG bigeminy, no acute ischemia  EKG Interpretation Date/Time:    Ventricular Rate:    PR Interval:    QRS Duration:    QT Interval:     QTC Calculation:   R Axis:      Text Interpretation:           Imaging Studies ordered: I ordered imaging studies including chest x-ray I independently visualized and interpreted imaging. I agree with the radiologist interpretation   Medicines ordered and prescription drug management: No orders of the defined types were placed in this encounter.   -I have reviewed the patients home medicines and have made adjustments as needed   Cardiac Monitoring: The patient was maintained on a cardiac monitor.  I personally viewed and interpreted the cardiac monitored which showed an underlying rhythm of: Sinus rhythm  Social Determinants of Health:  Factors impacting patients care include: Advanced age, and skilled nursing resident   Reevaluation: After the interventions noted above, I reevaluated the patient and found that they have :improved  Co morbidities  that complicate the patient evaluation  Past Medical History:  Diagnosis Date   Abdominal pain, chronic, left lower quadrant    Acute torn meniscus of knee    Adenomatous colon polyp 1970   carcinoma in situ   Allergic rhinitis    Amaurosis fugax 08/07/2012   Anxiety    Ascending aortic aneurysm (HCC) 12/07/2015   44mm by chest CTA 08/2020   Benign essential tremor syndrome    Bicuspid aortic valve    no AS on 07/2019   Carotid stenosis    1039 bilateral by dopplers 03/2017.    colon ca dx'd 1970   surg only   Coronary artery calcification seen on CT scan 09/26/2022   - Cath in 2002 normal - Myoview in 2017 low risk - Admx in 12/2021 w AF w RVR and elevated hsT c/w NSTEMI - pt preferred conservative mgmt (no cath)    DDD (degenerative disc disease)    Diverticulitis    Diverticulosis    DM (diabetes mellitus) (HCC)    Duodenitis    peptic, with gastric heterotopia   Endometriosis    s/p hysterectomy   Fundic gland polyposis of stomach    GERD (gastroesophageal reflux disease)    Heart murmur    Hiatal hernia  02/03/2005   History of cerebral aneurysm repair    s/p coiling   History of hemorrhoids    with bleeding   History of shingles    HLD (hyperlipidemia)    HTN (hypertension)    Hypercholesteremia    IBS (irritable bowel syndrome)    Iron deficiency    Lung nodule 07/12/2021   CT 05/2021: 3 mm right solid pulmonary nodule-no routine follow-up imaging recommended   Migraine    MVP (mitral valve prolapse)    Osteopenia    Peripheral neuropathy    Toe fracture, right    second toe   UTI (lower urinary tract infection)    Varicose vein          Final Clinical Impression(s) / ED Diagnoses Final diagnoses:  None     @PCDICTATION @    Afton Horse T, DO 07/13/23 1829

## 2023-07-13 NOTE — Progress Notes (Signed)
   07/13/23 2014  Vitals  Temp 98.1 F (36.7 C)  Temp Source Oral  BP (!) 169/78  MAP (mmHg) 97  BP Location Right Arm  BP Method Automatic  Patient Position (if appropriate) Lying  Pulse Rate (!) 54  Pulse Rate Source Monitor  Resp 18  Level of Consciousness  Level of Consciousness Alert  MEWS COLOR  MEWS Score Color Green  Oxygen Therapy  SpO2 99 %  O2 Device Room Air  Pain Assessment  Pain Scale 0-10  Pain Score 0  Height and Weight  Height 5' 4 (1.626 m)  Weight 76.8 kg  Type of Scale Used Bed  BSA (Calculated - sq m) 1.86 sq meters  BMI (Calculated) 29.05  Weight in (lb) to have BMI = 25 145.3  MEWS Score  MEWS Temp 0  MEWS Systolic 0  MEWS Pulse 0  MEWS RR 0  MEWS LOC 0  MEWS Score 0   Admitted pt to rm 3E05 from ED, pt alert and oriented x 4, denies any pain. Oriented to room, call bell placed within reach, placed on cardiac monitor CCMD made aware.

## 2023-07-14 DIAGNOSIS — K2100 Gastro-esophageal reflux disease with esophagitis, without bleeding: Secondary | ICD-10-CM

## 2023-07-14 DIAGNOSIS — F411 Generalized anxiety disorder: Secondary | ICD-10-CM

## 2023-07-14 DIAGNOSIS — I10 Essential (primary) hypertension: Secondary | ICD-10-CM | POA: Diagnosis not present

## 2023-07-14 DIAGNOSIS — I5033 Acute on chronic diastolic (congestive) heart failure: Secondary | ICD-10-CM | POA: Diagnosis not present

## 2023-07-14 DIAGNOSIS — I7121 Aneurysm of the ascending aorta, without rupture: Secondary | ICD-10-CM

## 2023-07-14 DIAGNOSIS — E1169 Type 2 diabetes mellitus with other specified complication: Secondary | ICD-10-CM

## 2023-07-14 DIAGNOSIS — I480 Paroxysmal atrial fibrillation: Secondary | ICD-10-CM

## 2023-07-14 DIAGNOSIS — E785 Hyperlipidemia, unspecified: Secondary | ICD-10-CM

## 2023-07-14 LAB — GLUCOSE, CAPILLARY
Glucose-Capillary: 123 mg/dL — ABNORMAL HIGH (ref 70–99)
Glucose-Capillary: 198 mg/dL — ABNORMAL HIGH (ref 70–99)
Glucose-Capillary: 95 mg/dL (ref 70–99)
Glucose-Capillary: 155 mg/dL — ABNORMAL HIGH (ref 70–99)

## 2023-07-14 LAB — BASIC METABOLIC PANEL WITH GFR
Anion gap: 10 (ref 5–15)
BUN: 14 mg/dL (ref 8–23)
CO2: 32 mmol/L (ref 22–32)
Calcium: 8.9 mg/dL (ref 8.9–10.3)
Chloride: 102 mmol/L (ref 98–111)
Creatinine, Ser: 1.07 mg/dL — ABNORMAL HIGH (ref 0.44–1.00)
GFR, Estimated: 50 mL/min — ABNORMAL LOW (ref >60)
Glucose, Bld: 150 mg/dL — ABNORMAL HIGH (ref 70–99)
Potassium: 4.1 mmol/L (ref 3.5–5.1)
Sodium: 144 mmol/L (ref 135–145)

## 2023-07-14 MED ORDER — SODIUM CHLORIDE 0.9% FLUSH
10.0000 mL | Freq: Two times a day (BID) | INTRAVENOUS | Status: DC
  Administered 2023-07-14 – 2023-07-19 (×10): 10 mL

## 2023-07-14 MED ORDER — LOSARTAN POTASSIUM 25 MG PO TABS
25.0000 mg | ORAL_TABLET | Freq: Every day | ORAL | Status: DC
  Administered 2023-07-14 – 2023-07-19 (×6): 25 mg via ORAL
  Filled 2023-07-14 (×6): qty 1

## 2023-07-14 MED ORDER — EMPAGLIFLOZIN 10 MG PO TABS
10.0000 mg | ORAL_TABLET | Freq: Every day | ORAL | Status: DC
  Administered 2023-07-14 – 2023-07-19 (×6): 10 mg via ORAL
  Filled 2023-07-14 (×6): qty 1

## 2023-07-14 MED ORDER — FUROSEMIDE 10 MG/ML IJ SOLN
60.0000 mg | Freq: Two times a day (BID) | INTRAMUSCULAR | Status: DC
  Administered 2023-07-14: 60 mg via INTRAVENOUS
  Filled 2023-07-14: qty 6

## 2023-07-14 MED ORDER — CHLORHEXIDINE GLUCONATE CLOTH 2 % EX PADS
6.0000 | MEDICATED_PAD | Freq: Every day | CUTANEOUS | Status: DC
  Administered 2023-07-14 – 2023-07-19 (×6): 6 via TOPICAL

## 2023-07-14 MED ORDER — LORAZEPAM 0.5 MG PO TABS
0.5000 mg | ORAL_TABLET | Freq: Four times a day (QID) | ORAL | Status: DC | PRN
  Administered 2023-07-14 – 2023-07-18 (×8): 0.5 mg via ORAL
  Filled 2023-07-14 (×8): qty 1

## 2023-07-14 NOTE — Hospital Course (Addendum)
 Mrs. Krupka was admitted to the hospital with the working diagnosis of heart failure exacerbation.   89 yo female with the past medical history of ascending aortic aneurysm, T2DM, atrial fibrillation and iron deficiency anemia who presented with dyspnea and edema. Patient reported worsening exertional dyspnea, lower extremity edema, PND and orthopnea.  Recent hospitalization 05/18 to 06/23/23 for atrial fibrillation with rapid ventricular response. Patient was placed on amiodarone , converted to sinus rhythm and was discharged home with home health services to assisted living facility. On her initial physical examination her blood pressure was 157/120, HR 54  RR 25 and 02 saturation 95%. Lungs with rales bilaterally, with coarse breath sounds, no wheezing, heart with S1 and S2 present and regular with no gallops or rubs, abdomen with no distention and positive lower extremity edema.   Na 140, K 4.1 Cl 104 bicarbonate 24 glucose 178 bun 15 cr 1,0  BNP 586  Wbc 9,5 hgb 11.2 plt 224   Chest radiograph with left rotation, hypoinflation, positive cardiomegaly with bilateral hilar vascular congestion, mild fluid in the right fissure, with no effusions.   EKG 80 bpm, normal axis, normal intervals, qtc 445 sinus rhythm with PAC, with no significant ST segment or T wave changes.   06/14 improved volume status, positive anxiety and deconditioning  06/15 patient had atrial fibrillation with RVR, mild elevation of high sensitive troponin. At 12:40 converted back to sinus rhythm.  06/16 patient very weak and deconditioned, plan to transfer to SNF. Today she is medically stable.  06/17 patient is medically stable for transfer to SNF. Continue physical therapy and occupational therapy.

## 2023-07-14 NOTE — Assessment & Plan Note (Signed)
 Patient was placed on insulin  sliding scale for glucose cover and monitoring during her hospitalization,  Basal insulin  18 units.  Her glucose remained well controlled.

## 2023-07-14 NOTE — Assessment & Plan Note (Addendum)
 Continue blood pressure control with metoprolol , uncontrolled hypertension.  Add losartan 25 mg po daily. Hydralazine  10 mg tid

## 2023-07-14 NOTE — Progress Notes (Signed)

## 2023-07-14 NOTE — Assessment & Plan Note (Signed)
 Continue blood pressure control with metoprolol , losartan and hydralazine  .  Continue diuresis

## 2023-07-14 NOTE — Assessment & Plan Note (Signed)
 Continue pantoprazole.

## 2023-07-14 NOTE — Assessment & Plan Note (Addendum)
 Echocardiogram with preserved LV systolic function with EF 65 to 70%, mild LVH, RV systolic function preserved,  LA and RA with moderate dilatation, mild to moderate tricuspid valve regurgitation, RVSP 44. mmHg. Cannot exclude small PFO.   Patient was placed on IV furosemide  for diuresis, negative fluid balance was achieved, -7894 ml, with significant improvement in her symptoms.   Continue spironolactone  and SGLT 2 inh.  Continue with metoprolol , hydralazine  and losartan Loop diuretic with Furosemide  20 mg daily.

## 2023-07-14 NOTE — Assessment & Plan Note (Signed)
Continue with buspirone. ? ?

## 2023-07-14 NOTE — Progress Notes (Signed)
 MEWS Progress Note  Patient Details Name: [name] MRN: [id number] DOB: 30-Jun-1932 Today's Date: 07/14/2023   MEWS Flowsheet Documentation:  Assess: MEWS Score Temp: 98.2 F (36.8 C) BP: 135/77 MAP (mmHg): 93 Pulse Rate: (!) 111 ECG Heart Rate: (!) 114 Resp: 20 Level of Consciousness: Alert SpO2: 94 % O2 Device: Room Air Assess: MEWS Score MEWS Temp: 0 MEWS Systolic: 0 MEWS Pulse: 2 MEWS RR: 0 MEWS LOC: 0 MEWS Score: 2 MEWS Score Color: Yellow Assess: SIRS CRITERIA SIRS Temperature : 0 SIRS Respirations : 0 SIRS Pulse: 1 SIRS WBC: 0 SIRS Score Sum : 1 Assess: if the MEWS score is Yellow or Red Were vital signs accurate and taken at a resting state?: Yes Does the patient meet 2 or more of the SIRS criteria?: No MEWS guidelines implemented : Yes, yellow Treat MEWS Interventions: Considered administering scheduled or prn medications/treatments as ordered Take Vital Signs Increase Vital Sign Frequency : Yellow: Q2hr x1, continue Q4hrs until patient remains green for 12hrs Escalate MEWS: Escalate: Yellow: Discuss with charge nurse and consider notifying provider and/or RRT        Albertha Alosa 07/14/2023, 7:58 PM

## 2023-07-14 NOTE — Assessment & Plan Note (Signed)
 Patient converted to atrial fibrillation, will continue amiodarone  and metoprolol .  Continue anticoagulation with apixaban .

## 2023-07-14 NOTE — TOC CM/SW Note (Signed)
 Transition of Care [facility]) - Inpatient Brief Assessment   Patient Details  Name: MARGO LAMA MRN: [id number] Date of Birth: Apr 27, 1932  [facility] CM/SW Contact:    Jennett Model, RN Phone Number: 07/14/2023, 3:35 PM   Clinical Narrative: From [facility], she states her spouse passed recently, she  has PCP and insurance on file, states has  [facility] services in place at this time and has walker and a cane  at home.  States ambulance will transport them home at dc and two step daughters is support system, states gets medications from [facility] on [address] Pta self ambulatory with walker.  Patient gives this NCM permission to speak with step daughters, she name one in particular, Debbie. NCM will make CSW aware patient is from ALF.   Transition of Care Asessment: Insurance and Status: Insurance coverage has been reviewed Patient has primary care physician: Yes Home environment has been reviewed: from [facility] Prior level of function:: ambulatory with walker Prior/Current Home Services: Current home services (walker, cane) Social Drivers of Health Review: SDOH reviewed no interventions necessary Readmission risk has been reviewed: Yes Transition of care needs: no transition of care needs at this time

## 2023-07-14 NOTE — Progress Notes (Signed)
 Progress Note   Patient: [name] ZOX:[id number] DOB: 03-16-1932 DOA: 07/13/2023     1 DOS: the patient was seen and examined on 07/14/2023   Brief hospital course: [name] was admitted to the hospital with the working diagnosis of heart failure exacerbation.   89 yo female with the past medical history of ascending aortic aneurysm, T2DM, atrial fibrillation and iron deficiency anemia who presented with dyspnea and edema. Patient reported worsening exertional dyspnea, lower extremity edema, PND and orthopnea.  Recent hospitalization 05/18 to 06/23/23 for atrial fibrillation with rapid ventricular response. Patient was placed on amiodarone , converted to sinus rhythm and was discharged home with home health services to assisted living facility. On her initial physical examination her blood pressure was 157/120, HR 54  RR 25 and 02 saturation 95%. Lungs with rales bilaterally, with coarse breath sounds, no wheezing, heart with S1 and S2 present and regular with no gallops or rubs, abdomen with no distention and positive lower extremity edema.   Na 140, K 4.1 Cl 104 bicarbonate 24 glucose 178 bun 15 cr 1,0  BNP 586  Wbc 9,5 hgb 11.2 plt 224   Chest radiograph with left rotation, hypoinflation, positive cardiomegaly with bilateral hilar vascular congestion, mild fluid in the right fissure, with no effusions.   EKG 80 bpm, normal axis, normal intervals, qtc 445 sinus rhythm with PAC, with no significant ST segment or T wave changes.   Assessment and Plan: * Acute on chronic diastolic CHF (congestive heart failure) (HCC) Echocardiogram with preserved LV systolic function with EF 65 to 70%, mild LVH, RV systolic function preserved,  LA and RA with moderate dilatation, mild to moderate tricuspid valve regurgitation, RVSP 44. mmHg. Cannot exclude small PFO.   Continue volume overloaded.  Systolic blood pressure 160 mmHg range.   Plan to continue diuresis with IV furosemide  increase dose  to 60 mg IV bid Continue spironolactone  and add SGLT 2 inh to augment diuresis.  Continue metoprolol , add ARB   Essential hypertension Continue blood pressure control with metoprolol , uncontrolled hypertension.  Add losartan 25 mg po daily. Hydralazine  10 mg tid   Paroxysmal atrial fibrillation (HCC) Patient converted to atrial fibrillation, will continue amiodarone  and metoprolol .  Continue anticoagulation with apixaban .   Type 2 diabetes mellitus with hyperlipidemia (HCC) Continue glucose cover and monitoring with insulin  sliding scale and basal insulin   Her fasting glucose today is 150 mg/dl    GERD Continue pantoprazole .   Ascending aortic aneurysm (HCC) Continue blood pressure control with metoprolol  and hydralazine , will add ARB.  Continue diuresis   Anxiety state Continue with buspirone      Subjective: Patient is feeling better, edema has improved but not back to baseline, no chest pain.   Physical Exam: Vitals:   07/13/23 2014 07/14/23 0046 07/14/23 0440 07/14/23 0746  BP: (!) 169/78 (!) 158/52 (!) 142/47 (!) 171/92  Pulse: (!) 54 (!) 48 (!) 45 (!) 50  Resp: 18 18 20  (!) 21  Temp: 98.1 F (36.7 C) 98.1 F (36.7 C) 97.6 F (36.4 C) 97.7 F (36.5 C)  TempSrc: Oral Axillary Axillary Oral  SpO2: 99% 95% 95% 94%  Weight: 76.8 kg  76 kg   Height: 5' 4 (1.626 m)      Neurology awake and alert ENT with mild pallor Cardiovascular with S1 and S2 present, irregularly irregular with no gallops or rubs positive systolic murmur at the apex Respiratory with mild rales at bases with no wheezing or rhonchi  Abdomen with no distention  Lower extremity edema +++  Data Reviewed:    Family Communication: no family at the bedside   Disposition: Status is: Inpatient Remains inpatient appropriate because: IV diuresis   Planned Discharge Destination: Assisted living facility     Author: Albertus Alt, MD 07/14/2023 10:45 AM  For on call review  [email or web address].

## 2023-07-14 NOTE — Progress Notes (Signed)
 PT Cancellation Note  Patient Details Name: [name] MRN: [id number] DOB: 1932-08-17   Cancelled Treatment:    Reason Eval/Treat Not Completed: Patient declined, no reason specified (PT consult appreciated and chart reviewed. Pt reported not feeling up to participate d/t taking lasix , feeling fatigued, and being fearful of falling. Educated pt on the role of PT and that she has no restrictions. Pt refused reporting tomorrow will be better.) Will follow-up for PT evaluation as schedule permits.   Glenford Lanes, PT, DPT Acute Rehabilitation Services Office: [phone] Secure Chat Preferred  Riva Chester 07/14/2023, 2:40 PM

## 2023-07-14 NOTE — Progress Notes (Signed)
 Heart Failure Navigator Progress Note  Assessed for Heart & Vascular TOC clinic readiness.  Patient does not meet criteria due to has a scheduled [facility] appointment on 8/20. No HF TOC per Dr. Sunnie England.   Navigator will sign off at this time.   Randie Bustle, BSN, Scientist, clinical (histocompatibility and immunogenetics) Only

## 2023-07-15 DIAGNOSIS — E1169 Type 2 diabetes mellitus with other specified complication: Secondary | ICD-10-CM | POA: Diagnosis not present

## 2023-07-15 DIAGNOSIS — N179 Acute kidney failure, unspecified: Secondary | ICD-10-CM

## 2023-07-15 DIAGNOSIS — I5033 Acute on chronic diastolic (congestive) heart failure: Secondary | ICD-10-CM | POA: Diagnosis not present

## 2023-07-15 DIAGNOSIS — I10 Essential (primary) hypertension: Secondary | ICD-10-CM | POA: Diagnosis not present

## 2023-07-15 DIAGNOSIS — I480 Paroxysmal atrial fibrillation: Secondary | ICD-10-CM | POA: Diagnosis not present

## 2023-07-15 LAB — GLUCOSE, CAPILLARY
Glucose-Capillary: 141 mg/dL — ABNORMAL HIGH (ref 70–99)
Glucose-Capillary: 174 mg/dL — ABNORMAL HIGH (ref 70–99)
Glucose-Capillary: 215 mg/dL — ABNORMAL HIGH (ref 70–99)
Glucose-Capillary: 168 mg/dL — ABNORMAL HIGH (ref 70–99)

## 2023-07-15 LAB — BASIC METABOLIC PANEL WITH GFR
Anion gap: 10 (ref 5–15)
BUN: 30 mg/dL — ABNORMAL HIGH (ref 8–23)
CO2: 31 mmol/L (ref 22–32)
Calcium: 8.9 mg/dL (ref 8.9–10.3)
Chloride: 97 mmol/L — ABNORMAL LOW (ref 98–111)
Creatinine, Ser: 1.34 mg/dL — ABNORMAL HIGH (ref 0.44–1.00)
GFR, Estimated: 38 mL/min — ABNORMAL LOW (ref >60)
Glucose, Bld: 161 mg/dL — ABNORMAL HIGH (ref 70–99)
Potassium: 3.6 mmol/L (ref 3.5–5.1)
Sodium: 138 mmol/L (ref 135–145)

## 2023-07-15 LAB — MAGNESIUM: Magnesium: 1.8 mg/dL (ref 1.7–2.4)

## 2023-07-15 MED ORDER — MAGNESIUM SULFATE 2 GM/50ML IV SOLN
2.0000 g | Freq: Once | INTRAVENOUS | Status: AC
  Administered 2023-07-15: 2 g via INTRAVENOUS
  Filled 2023-07-15: qty 50

## 2023-07-15 NOTE — Evaluation (Signed)
 Occupational Therapy Evaluation Patient Details Name: [name] MRN: [id number] DOB: [birth year] Today's Date: 07/15/2023   History of Present Illness   Patient is 89 yo female admitted on 07/13/23 for CHF exacerbation. PMH significant for T2DM, CAD, HTN, AAA, PVD, TIA, cerebral aneurysm s/p coiling, IBS, duodenitis, anxiety, afib.     Clinical Impressions Pt admitted based on above, and was seen based on problem list below. PTA pt was living at ALF and was independent with ADLs. Today pt is requiring set up  to CGA for ADLs. Pt reporting high levels of anxiety, and awaiting anxiety meds from RN, bed mobility and functional transfers were declined. Education session completed on fall prevention and signs of CHF exacerbation, handout provided. Recommendation of HHOT to promote safety within the home environment. OT will continue to follow acutely to maximize functional independence.        If plan is discharge home, recommend the following:   A little help with walking and/or transfers;A little help with bathing/dressing/bathroom     Functional Status Assessment   Patient has had a recent decline in their functional status and demonstrates the ability to make significant improvements in function in a reasonable and predictable amount of time.     Equipment Recommendations   None recommended by OT      Precautions/Restrictions   Precautions Precautions: Fall Recall of Precautions/Restrictions: Intact     Mobility Bed Mobility Overal bed mobility: Needs Assistance   General bed mobility comments: Pt declining EOB d/t anxiety    Transfers Overall transfer level: Needs assistance     General transfer comment: Pt declining d/t just getting in bed and awaiting anxiety meds      Balance Overall balance assessment: Needs assistance     ADL either performed or assessed with clinical judgement   ADL Overall ADL's : Needs assistance/impaired Eating/Feeding:  Set up;Sitting   Grooming: Set up;Sitting   Upper Body Bathing: Set up;Sitting   Lower Body Bathing: Contact guard assist;Sit to/from stand   Upper Body Dressing : Set up;Sitting   Lower Body Dressing: Minimal assistance;Sit to/from stand   Toilet Transfer: Contact guard assist;Ambulation;Rolling walker (2 wheels)   Toileting- Clothing Manipulation and Hygiene: Contact guard assist;Sit to/from stand       Functional mobility during ADLs: Contact guard assist;Rolling walker (2 wheels) General ADL Comments: Based on clinical judgement d/t pt declining ADLs d/t anxiety     Vision Baseline Vision/History: 0 No visual deficits Patient Visual Report: No change from baseline Vision Assessment?: No apparent visual deficits            Pertinent Vitals/Pain Pain Assessment Pain Assessment: No/denies pain     Extremity/Trunk Assessment Upper Extremity Assessment Upper Extremity Assessment: Generalized weakness   Lower Extremity Assessment Lower Extremity Assessment: Defer to PT evaluation   Cervical / Trunk Assessment Cervical / Trunk Assessment: Normal   Communication Communication Communication: Impaired Factors Affecting Communication: Hearing impaired   Cognition Arousal: Alert Behavior During Therapy: Anxious Cognition: No apparent impairments       OT - Cognition Comments: Incredibly anxious, takes meds for management at base     Following commands: Intact       Cueing  General Comments   Cueing Techniques: Verbal cues  Access Code: [email or web address]  URL: [email or web address]  Date: 07/15/2023  Prepared by: Charls Cooks    Patient Education  - How to Prevent Falls  - My Heart Failure Action Plan  Home Living Family/patient expects to be discharged to:: Assisted living     Home Equipment: Rolling Walker (2 wheels);Cane - single point;Wheelchair - manual;Shower seat          Prior Functioning/Environment Prior Level of  Function : Independent/Modified Independent             Mobility Comments: Use of RW,pt reports 1 fall in last few months, extreme fear of falling ADLs Comments: Assist with shower transfers    OT Problem List: Decreased strength;Decreased range of motion;Decreased activity tolerance;Impaired balance (sitting and/or standing);Cardiopulmonary status limiting activity   OT Treatment/Interventions: Self-care/ADL training;Therapeutic exercise;Energy conservation;DME and/or AE instruction;Therapeutic activities;Patient/family education;Balance training      OT Goals(Current goals can be found in the care plan section)   Acute Rehab OT Goals Patient Stated Goal: To rest OT Goal Formulation: With patient Time For Goal Achievement: 07/29/23 Potential to Achieve Goals: Good   OT Frequency:  Min 2X/week       AM-PAC OT 6 Clicks Daily Activity     Outcome Measure Help from another person eating meals?: None Help from another person taking care of personal grooming?: A Little Help from another person toileting, which includes using toliet, bedpan, or urinal?: A Little Help from another person bathing (including washing, rinsing, drying)?: A Little Help from another person to put on and taking off regular upper body clothing?: A Little Help from another person to put on and taking off regular lower body clothing?: A Little 6 Click Score: 19   End of Session Nurse Communication: Mobility status  Activity Tolerance: Other (comment) (Limited by anxiety) Patient left: in bed;with call bell/phone within reach;with bed alarm set  OT Visit Diagnosis: Unsteadiness on feet (R26.81);Other abnormalities of gait and mobility (R26.89);Muscle weakness (generalized) (M62.81)                Time: 1610-9604 OT Time Calculation (min): 17 min Charges:  OT General Charges $OT Visit: 1 Visit OT Evaluation $OT Eval Moderate Complexity: 1 Mod  Delmer Ferraris, OT  Acute Rehabilitation Services Office  [phone] Secure chat preferred   Mickael Alamo 07/15/2023, 4:41 PM

## 2023-07-15 NOTE — Progress Notes (Addendum)
 Progress Note   Patient: [name] WGN:[id number] DOB: 01/29/1933 DOA: 07/13/2023     2 DOS: the patient was seen and examined on 07/15/2023   Brief hospital course: Mrs. Frede was admitted to the hospital with the working diagnosis of heart failure exacerbation.   89 yo female with the past medical history of ascending aortic aneurysm, T2DM, atrial fibrillation and iron deficiency anemia who presented with dyspnea and edema. Patient reported worsening exertional dyspnea, lower extremity edema, PND and orthopnea.  Recent hospitalization 05/18 to 06/23/23 for atrial fibrillation with rapid ventricular response. Patient was placed on amiodarone , converted to sinus rhythm and was discharged home with home health services to assisted living facility. On her initial physical examination her blood pressure was 157/120, HR 54  RR 25 and 02 saturation 95%. Lungs with rales bilaterally, with coarse breath sounds, no wheezing, heart with S1 and S2 present and regular with no gallops or rubs, abdomen with no distention and positive lower extremity edema.   Na 140, K 4.1 Cl 104 bicarbonate 24 glucose 178 bun 15 cr 1,0  BNP 586  Wbc 9,5 hgb 11.2 plt 224   Chest radiograph with left rotation, hypoinflation, positive cardiomegaly with bilateral hilar vascular congestion, mild fluid in the right fissure, with no effusions.   EKG 80 bpm, normal axis, normal intervals, qtc 445 sinus rhythm with PAC, with no significant ST segment or T wave changes.   06/14 improved volume status, positive anxiety and deconditioning   Assessment and Plan: * Acute on chronic diastolic CHF (congestive heart failure) (HCC) Echocardiogram with preserved LV systolic function with EF 65 to 70%, mild LVH, RV systolic function preserved,  LA and RA with moderate dilatation, mild to moderate tricuspid valve regurgitation, RVSP 44. mmHg. Cannot exclude small PFO.   Improved volume status Urine output is 3,600 ml  Systolic  blood pressure 130 mmHg range.   Continue spironolactone  and SGLT 2 inh to augment diuresis.  Continue metoprolol  and losartan Hold on loop diuretic for now.   Essential hypertension Continue blood pressure control with metoprolol , hydralazine  and losartan.  Continue blood pressure monitoring   Paroxysmal atrial fibrillation (HCC) Continue amiodarone  and metoprolol .  Patient is back on sinus rhythm per telemetry review  Continue anticoagulation with apixaban .   AKI (acute kidney injury) (HCC) Renal function with serum cr at 1,34 with K at 3,6 and serum bicarbonate at 31  Na 138 and Mg 1,8   Add 2 g mag sulfate to avoid hypomagnesemia.  Follow up renal function and electrolytes in am.   Type 2 diabetes mellitus with hyperlipidemia (HCC) Continue glucose cover and monitoring with insulin  sliding scale and basal insulin   Her fasting glucose today is 161 mg/dl    GERD Continue pantoprazole .   Ascending aortic aneurysm (HCC) Continue blood pressure control with metoprolol , losartan and hydralazine  .  Continue diuresis   Anxiety state Continue with buspirone , add as needed lorazepam .       Subjective: patient with improvement in peripheral edema, she is having anxiety and this is triggering dyspnea, no chest pain, tolerating po well.   Physical Exam: Vitals:   07/15/23 1235 07/15/23 1247 07/15/23 1611 07/15/23 1614  BP: 111/65 (!) 137/55 (!) 130/118 (!) 130/118  Pulse: (!) 54 (!) 57    Resp: 12 17 17    Temp:      TempSrc:      SpO2: 96% 95% 97%   Weight:      Height:  Neurology awake and alert ENT with mild pallor Cardiovascular with S1 and S2 present and regular with no gallops, rubs or murmurs Respiratory with no rales or wheezing, no rhonchi, poor inspiratory effort and distant breath sounds Abdomen with no distention, soft and non tender Positive non pitting lower extremity edema, lymphedema  Data Reviewed:    Family Communication: no family at the  bedside   Disposition: Status is: Inpatient Remains inpatient appropriate because: recovering from heart failure   Planned Discharge Destination: Home     Author: Albertus Alt, MD 07/15/2023 4:37 PM  For on call review [email or web address].

## 2023-07-15 NOTE — Assessment & Plan Note (Signed)
 Stable renal function with serum cr at 1,17 with K at 4,4 and serum bicarbonate at 28  Na 137   Continue with furosemide  20 mg po daily.

## 2023-07-15 NOTE — Plan of Care (Signed)

## 2023-07-15 NOTE — Evaluation (Signed)
 Physical Therapy Evaluation Patient Details Name: [name] MRN: [id number] DOB: 04-03-1932 Today's Date: 07/15/2023  History of Present Illness  Patient is 89 yo female admitted on 07/13/23 for CHF exacerbation. PMH significant for T2DM, CAD, HTN, AAA, PVD, TIA, cerebral aneurysm s/p coiling, IBS, duodenitis, anxiety, afib.  Clinical Impression  At baseline, patient lives in ALF and completes mobility/ADLs independently. She reports using 2WW for ambulation within her apartment but has not ambulated outside apartment secondary to fear of falling and lack of confidence. She has been working with [facility] physical therapy. Patient presents today with decreased activity tolerance, generalized weakness, and impaired functional mobility. Moderate B LE edema present but patient reports this has been improving. Unable to tolerate LE elevated in chair due to reports of difficulty breathing. Patient ambulated 10 feet x1 and 20 feet x1 with 2WW and CGA. Reported some dizziness and shortness of breath. Vital signs remained WNL on room air. Returned to chair end of session. Patient would continue to benefit from skilled acute PT services to address the above impairments and reduce risk of falls upon returning to ALF. Recommend continued [facility] PT services.         If plan is discharge home, recommend the following: A little help with walking and/or transfers;A little help with bathing/dressing/bathroom;Direct supervision/assist for medications management;Assist for transportation   Can travel by private vehicle        Equipment Recommendations None recommended by PT;Other (comment) (Patient has 2WW for ambulation.)  Recommendations for Other Services       Functional Status Assessment Patient has had a recent decline in their functional status and demonstrates the ability to make significant improvements in function in a reasonable and predictable amount of time.     Precautions / Restrictions  Precautions Precautions: Fall Restrictions Weight Bearing Restrictions Per Provider Order: No      Mobility  Bed Mobility               General bed mobility comments: bed mobility not assessed. Patient seated upright in chair and returned to chair.    Transfers Overall transfer level: Needs assistance Equipment used: Rolling walker (2 wheels) Transfers: Sit to/from Stand Sit to Stand: Contact guard assist           General transfer comment: CGA for safe transfer to 2WW x2 reps, cues for hand placement    Ambulation/Gait Ambulation/Gait assistance: Contact guard assist Gait Distance (Feet): 30 Feet (10 feet x1 and 20 feet x1) Assistive device: Rolling walker (2 wheels) Gait Pattern/deviations: Step-through pattern, Decreased stride length, Trunk flexed, Shuffle Gait velocity: decreased Gait velocity interpretation: <1.31 ft/sec, indicative of household ambulator   General Gait Details: Patient did report some dizziness with ambulation however vitals signs remained WNL.      Balance Overall balance assessment: Needs assistance Sitting-balance support: Feet supported Sitting balance-Leahy Scale: Good Sitting balance - Comments: supervision seated upright in chair without back support   Standing balance support: Bilateral upper extremity supported Standing balance-Leahy Scale: Poor Standing balance comment: requires bilateral UE support on 2WW for balance                             Pertinent Vitals/Pain Pain Assessment Pain Assessment: No/denies pain    Home Living Family/patient expects to be discharged to:: Assisted living                 Home Equipment: Rolling Walker (2 wheels);Cane - single  point;Wheelchair - manual;Shower seat      Prior Function Prior Level of Function : Independent/Modified Independent             Mobility Comments: Patient reports modI with RW for ambulation within her ALF apartment. She has not been to  dining hall secondary to unable to walk this distance. Reports 1 fall in the past few months and fear of falling ever since. ADLs Comments: Reports bathing and dressing herself. Meals delivered to room.     Extremity/Trunk Assessment        Lower Extremity Assessment Lower Extremity Assessment: Generalized weakness    Cervical / Trunk Assessment Cervical / Trunk Assessment: Normal  Communication   Communication Communication: No apparent difficulties    Cognition Arousal: Alert Behavior During Therapy: WFL for tasks assessed/performed   PT - Cognitive impairments: No apparent impairments                         Following commands: Intact       Cueing Cueing Techniques: Verbal cues, Tactile cues     General Comments      Exercises     Assessment/Plan    PT Assessment Patient needs continued PT services  PT Problem List Decreased strength;Decreased balance;Decreased mobility;Decreased cognition;Decreased knowledge of use of DME;Decreased safety awareness;Cardiopulmonary status limiting activity;Decreased activity tolerance       PT Treatment Interventions DME instruction;Gait training;Functional mobility training;Therapeutic activities;Therapeutic exercise;Balance training;Cognitive remediation;Patient/family education    PT Goals (Current goals can be found in the Care Plan section)  Acute Rehab PT Goals Patient Stated Goal: return to ALF PT Goal Formulation: With patient Time For Goal Achievement: 07/29/23 Potential to Achieve Goals: Good    Frequency Min 2X/week     Co-evaluation               AM-PAC PT 6 Clicks Mobility  Outcome Measure Help needed turning from your back to your side while in a flat bed without using bedrails?: A Little Help needed moving from lying on your back to sitting on the side of a flat bed without using bedrails?: A Little Help needed moving to and from a bed to a chair (including a wheelchair)?: A  Little Help needed standing up from a chair using your arms (e.g., wheelchair or bedside chair)?: A Little Help needed to walk in hospital room?: A Little Help needed climbing 3-5 steps with a railing? : A Lot 6 Click Score: 17    End of Session Equipment Utilized During Treatment: Gait belt Activity Tolerance: Patient tolerated treatment well Patient left: with call bell/phone within reach;in chair;Other (comment) (attempted to raise LE for edema management however patient reported it was difficult to breathe with her LE elevated) Nurse Communication: Mobility status PT Visit Diagnosis: Difficulty in walking, not elsewhere classified (R26.2)    Time: 1030-1104 PT Time Calculation (min) (ACUTE ONLY): 34 min   Charges:   PT Evaluation $PT Eval Low Complexity: 1 Low PT Treatments $Therapeutic Activity: 8-22 mins PT General Charges $$ ACUTE PT VISIT: 1 Visit         Mariano Shiver, PT, DPT [facility] Acute Rehabilitation Office: [phone]   [name] 07/15/2023, 1:01 PM

## 2023-07-16 DIAGNOSIS — I10 Essential (primary) hypertension: Secondary | ICD-10-CM | POA: Diagnosis not present

## 2023-07-16 DIAGNOSIS — N179 Acute kidney failure, unspecified: Secondary | ICD-10-CM

## 2023-07-16 DIAGNOSIS — I480 Paroxysmal atrial fibrillation: Secondary | ICD-10-CM | POA: Diagnosis not present

## 2023-07-16 DIAGNOSIS — I5033 Acute on chronic diastolic (congestive) heart failure: Secondary | ICD-10-CM | POA: Diagnosis not present

## 2023-07-16 LAB — BASIC METABOLIC PANEL WITH GFR
Anion gap: 11 (ref 5–15)
BUN: 34 mg/dL — ABNORMAL HIGH (ref 8–23)
CO2: 30 mmol/L (ref 22–32)
Calcium: 8.6 mg/dL — ABNORMAL LOW (ref 8.9–10.3)
Chloride: 98 mmol/L (ref 98–111)
Creatinine, Ser: 1.18 mg/dL — ABNORMAL HIGH (ref 0.44–1.00)
GFR, Estimated: 44 mL/min — ABNORMAL LOW (ref >60)
Glucose, Bld: 149 mg/dL — ABNORMAL HIGH (ref 70–99)
Potassium: 4.2 mmol/L (ref 3.5–5.1)
Sodium: 139 mmol/L (ref 135–145)

## 2023-07-16 LAB — TROPONIN I (HIGH SENSITIVITY)
Troponin I (High Sensitivity): 65 ng/L — ABNORMAL HIGH (ref <18)
Troponin I (High Sensitivity): 86 ng/L — ABNORMAL HIGH (ref <18)

## 2023-07-16 LAB — CBC WITH DIFFERENTIAL/PLATELET
Abs Immature Granulocytes: 0.02 10*3/uL (ref 0.00–0.07)
Basophils Absolute: 0.1 10*3/uL (ref 0.0–0.1)
Basophils Relative: 1 %
Eosinophils Absolute: 0.3 10*3/uL (ref 0.0–0.5)
Eosinophils Relative: 3 %
HCT: 37 % (ref 36.0–46.0)
Hemoglobin: 11.7 g/dL — ABNORMAL LOW (ref 12.0–15.0)
Immature Granulocytes: 0 %
Lymphocytes Relative: 51 %
Lymphs Abs: 5.3 10*3/uL — ABNORMAL HIGH (ref 0.7–4.0)
MCH: 24 pg — ABNORMAL LOW (ref 26.0–34.0)
MCHC: 31.6 g/dL (ref 30.0–36.0)
MCV: 76 fL — ABNORMAL LOW (ref 80.0–100.0)
Monocytes Absolute: 0.8 10*3/uL (ref 0.1–1.0)
Monocytes Relative: 7 %
Neutro Abs: 3.9 10*3/uL (ref 1.7–7.7)
Neutrophils Relative %: 38 %
Platelets: 236 10*3/uL (ref 150–400)
RBC: 4.87 MIL/uL (ref 3.87–5.11)
RDW: 15.7 % — ABNORMAL HIGH (ref 11.5–15.5)
WBC: 10.4 10*3/uL (ref 4.0–10.5)
nRBC: 0 % (ref 0.0–0.2)

## 2023-07-16 LAB — GLUCOSE, CAPILLARY
Glucose-Capillary: 140 mg/dL — ABNORMAL HIGH (ref 70–99)
Glucose-Capillary: 182 mg/dL — ABNORMAL HIGH (ref 70–99)
Glucose-Capillary: 142 mg/dL — ABNORMAL HIGH (ref 70–99)
Glucose-Capillary: 176 mg/dL — ABNORMAL HIGH (ref 70–99)

## 2023-07-16 LAB — MAGNESIUM: Magnesium: 2.4 mg/dL (ref 1.7–2.4)

## 2023-07-16 MED ORDER — METOPROLOL TARTRATE 25 MG/10 ML ORAL SUSPENSION
25.0000 mg | Freq: Two times a day (BID) | ORAL | Status: DC
  Administered 2023-07-16 – 2023-07-19 (×5): 25 mg via ORAL
  Filled 2023-07-16 (×7): qty 10

## 2023-07-16 MED ORDER — HYDRALAZINE HCL 20 MG/ML IJ SOLN
10.0000 mg | INTRAMUSCULAR | Status: DC | PRN
  Administered 2023-07-16: 10 mg via INTRAVENOUS
  Filled 2023-07-16: qty 1

## 2023-07-16 MED ORDER — DICLOFENAC SODIUM 1 % EX GEL
2.0000 g | Freq: Four times a day (QID) | CUTANEOUS | Status: DC
  Administered 2023-07-16 – 2023-07-19 (×9): 2 g via TOPICAL
  Filled 2023-07-16: qty 100

## 2023-07-16 MED ORDER — METOPROLOL TARTRATE 5 MG/5ML IV SOLN: 5.0000 mg | INTRAVENOUS | Status: DC | PRN

## 2023-07-16 MED ORDER — FUROSEMIDE 20 MG PO TABS
20.0000 mg | ORAL_TABLET | Freq: Every day | ORAL | Status: DC
  Administered 2023-07-16 – 2023-07-19 (×4): 20 mg via ORAL
  Filled 2023-07-16 (×4): qty 1

## 2023-07-16 MED ORDER — METOPROLOL TARTRATE 5 MG/5ML IV SOLN
2.5000 mg | Freq: Once | INTRAVENOUS | Status: AC
  Administered 2023-07-16: 2.5 mg via INTRAVENOUS
  Filled 2023-07-16: qty 5

## 2023-07-16 NOTE — Plan of Care (Signed)
   Problem: Health Behavior/Discharge Planning: Goal: Ability to manage health-related needs will improve Outcome: Progressing

## 2023-07-16 NOTE — Progress Notes (Signed)
 After getting patient's weight. RN and NT placed patient comfortably on bed. CCMD called and claimed patient went to on and off Afib - Aflutter. RN checked patient and obtain vital signs. Patient also complained chest pain and left arm pain. She described pain as heaviness. MD notified and ordered EKG, CBC and IV lopressor . Patient also claimed she felt anxious. RN gave PRN ativan .

## 2023-07-16 NOTE — Plan of Care (Signed)

## 2023-07-16 NOTE — Progress Notes (Addendum)
 Progress Note   Patient: [name] ZOX:[id number] DOB: 12-28-1932 DOA: 07/13/2023     3 DOS: the patient was seen and examined on 07/16/2023   Brief hospital course: Mrs. Widjaja was admitted to the hospital with the working diagnosis of heart failure exacerbation.   89 yo female with the past medical history of ascending aortic aneurysm, T2DM, atrial fibrillation and iron deficiency anemia who presented with dyspnea and edema. Patient reported worsening exertional dyspnea, lower extremity edema, PND and orthopnea.  Recent hospitalization 05/18 to 06/23/23 for atrial fibrillation with rapid ventricular response. Patient was placed on amiodarone , converted to sinus rhythm and was discharged home with home health services to assisted living facility. On her initial physical examination her blood pressure was 157/120, HR 54  RR 25 and 02 saturation 95%. Lungs with rales bilaterally, with coarse breath sounds, no wheezing, heart with S1 and S2 present and regular with no gallops or rubs, abdomen with no distention and positive lower extremity edema.   Na 140, K 4.1 Cl 104 bicarbonate 24 glucose 178 bun 15 cr 1,0  BNP 586  Wbc 9,5 hgb 11.2 plt 224   Chest radiograph with left rotation, hypoinflation, positive cardiomegaly with bilateral hilar vascular congestion, mild fluid in the right fissure, with no effusions.   EKG 80 bpm, normal axis, normal intervals, qtc 445 sinus rhythm with PAC, with no significant ST segment or T wave changes.   06/14 improved volume status, positive anxiety and deconditioning  06/15 patient had atrial fibrillation with RVR, mild elevation of high sensitive troponin. At 12:40 converted back to sinus rhythm.    Assessment and Plan: * Acute on chronic diastolic CHF (congestive heart failure) (HCC) Echocardiogram with preserved LV systolic function with EF 65 to 70%, mild LVH, RV systolic function preserved,  LA and RA with moderate dilatation, mild to moderate  tricuspid valve regurgitation, RVSP 44. mmHg. Cannot exclude small PFO.   Improved volume status Urine output is 1, 700 ml  Systolic blood pressure 160 mmHg range.   Continue spironolactone  and SGLT 2 inh to augment diuresis.  Resume metoprolol  and continue with losartan Resume loop diuretic with furosemide  20 mg daily.   Essential hypertension Continue blood pressure control with hydralazine  and losartan.  Resume metoprolol  25 mg po bid.  Continue blood pressure monitoring   Paroxysmal atrial fibrillation (HCC) Continue amiodarone  and resume metoprolol .  Patient is back on sinus rhythm per telemetry review  Add PRN IV metoprolol  in case of HR 130 bpm or grater. (Atrial fibrillation)  Continue anticoagulation with apixaban .   AKI (acute kidney injury) (HCC) Improved renal function with serum cr at 1,18 with K at 4,2 and serum bicarbonate at 30  Na 139 and Mg 2,4    Continue close follow up renal function and electrolytes.  Resume oral furosemide  20 mg po daily.   Type 2 diabetes mellitus with hyperlipidemia (HCC) Continue glucose cover and monitoring with insulin  sliding scale and basal insulin   Her fasting glucose today is 149 mg/dl    GERD Continue pantoprazole .   Ascending aortic aneurysm (HCC) Continue blood pressure control with losartan and hydralazine  .  Resume metoprolol  and furosemide    Anxiety state Continue with buspirone , add as needed lorazepam .     Subjective: Patient with no chest pain or dyspnea, she had shoulder pain more left than right, anxiety has improved with as needed lorazepam .   Physical Exam: Vitals:   07/16/23 0544 07/16/23 0616 07/16/23 0700 07/16/23 1300  BP: (!) 197/61 119/82  114/61 102/61  Pulse:   (!) 117 82  Resp:   (!) 22 (!) 22  Temp:    98 F (36.7 C)  TempSrc:   Oral Oral  SpO2:   94% 95%  Weight:      Height:       Neurology awake and alert ENT with mild pallor with no icterus Cardiovascular with S1 and S2 present and  regular with no gallops or rubs, no murmurs Respiratory with no rales or wheezing, no rhonchi  Abdomen with no distention  Positive bilateral non pitting lower extremity edema, likely lymphedema  Data Reviewed:    Family Communication: no family at the bedside   Disposition: Status is: Inpatient Remains inpatient appropriate because: uncontrolled atrial fibrillation   Planned Discharge Destination: assisted living facility     Author: Albertus Alt, MD 07/16/2023 4:13 PM  For on call review [email or web address].

## 2023-07-16 NOTE — Progress Notes (Addendum)
 TRH night cross cover note:   I was notified by the patient's RN that the pt's BP is running high, most recently 197/61. This is slightly higher than the SBP's throughout the rest of tonight's shift, during which time her SP was mainly noted to be in the 160's mmHg in this patient who is hospitalized for acute decompensated heart failure.  Other vital signs appear stable, including heart rates in the 50s to 60s, respiratory rate 14-20, and oxygen saturation in the high 90s on room air.  Patient states she feels slightly dizzy at this time, but otherwise without additional acute complaint, and without any report of acute focal neuro deficits.   I subsequently added as needed IV hydralazine  for systolic blood pressure greater than 180 mmHg.     Update: Upon assisting the patient to have bedside scale for daily weight, the patient was noted to go back into atrial fibrillation with RVR, with sustained heart rates in the 120s, and most recent blood pressure  119/82.  This is in the setting of a known history of paroxysmal atrial fibrillation for which the patient is chronically anticoagulated on Eliquis .  She is also on amiodarone  as well as oral metoprolol .  Upon going into atrial fibrillation with RVR this morning, the patient reports some mild chest pressure.  Additional vital signs at this time notable for afebrile, respiratory rate in the low 20s, oxygen saturation 98% on room air.   Labs this morning are notable for potassium 4.2 as well as magnesium  level 2.4.  In the setting of her history of iron deficiency anemia while on Eliquis , will also check CBC this morning.  I have ordered a one-time dose of Lopressor  2.5 mg IV x 1 dose now, and ordered stat EKG, which, in comparison to most recent prior EKG from 07/13/2023 shows atrial fibrillation with RVR, heart rate 113, T wave flattening in lead II, ST depression in leads I, aVL, V4, and V5, which appears new relative to most recent prior EKG on 07/13/2023,  will demonstrating no evidence of ST elevation.  Suspect that these ST changes are rate related as opposed to representing type I ischemia due to acute plaque rupture. However, in setting of concominant CP, will check troponin, with plan to repeat EKG with improving rate control to confirm suspected rate related ST changes.    Camelia Cavalier, DO Hospitalist

## 2023-07-17 DIAGNOSIS — I480 Paroxysmal atrial fibrillation: Secondary | ICD-10-CM | POA: Diagnosis not present

## 2023-07-17 DIAGNOSIS — I10 Essential (primary) hypertension: Secondary | ICD-10-CM | POA: Diagnosis not present

## 2023-07-17 DIAGNOSIS — I5033 Acute on chronic diastolic (congestive) heart failure: Secondary | ICD-10-CM | POA: Diagnosis not present

## 2023-07-17 DIAGNOSIS — N179 Acute kidney failure, unspecified: Secondary | ICD-10-CM | POA: Diagnosis not present

## 2023-07-17 LAB — BASIC METABOLIC PANEL WITH GFR
Anion gap: 11 (ref 5–15)
BUN: 29 mg/dL — ABNORMAL HIGH (ref 8–23)
CO2: 28 mmol/L (ref 22–32)
Calcium: 8.7 mg/dL — ABNORMAL LOW (ref 8.9–10.3)
Chloride: 98 mmol/L (ref 98–111)
Creatinine, Ser: 1.17 mg/dL — ABNORMAL HIGH (ref 0.44–1.00)
GFR, Estimated: 45 mL/min — ABNORMAL LOW (ref >60)
Glucose, Bld: 205 mg/dL — ABNORMAL HIGH (ref 70–99)
Potassium: 4.4 mmol/L (ref 3.5–5.1)
Sodium: 137 mmol/L (ref 135–145)

## 2023-07-17 LAB — GLUCOSE, CAPILLARY
Glucose-Capillary: 114 mg/dL — ABNORMAL HIGH (ref 70–99)
Glucose-Capillary: 132 mg/dL — ABNORMAL HIGH (ref 70–99)
Glucose-Capillary: 184 mg/dL — ABNORMAL HIGH (ref 70–99)
Glucose-Capillary: 137 mg/dL — ABNORMAL HIGH (ref 70–99)

## 2023-07-17 NOTE — Progress Notes (Signed)
 Progress Note   Patient: [name]:[id number] DOB: 1932/09/24 DOA: 07/13/2023     4 DOS: the patient was seen and examined on 07/17/2023   Brief hospital course: [name] was admitted to the hospital with the working diagnosis of heart failure exacerbation.   89 yo female with the past medical history of ascending aortic aneurysm, T2DM, atrial fibrillation and iron deficiency anemia who presented with dyspnea and edema. Patient reported worsening exertional dyspnea, lower extremity edema, PND and orthopnea.  Recent hospitalization 05/18 to 06/23/23 for atrial fibrillation with rapid ventricular response. Patient was placed on amiodarone , converted to sinus rhythm and was discharged home with home health services to assisted living facility. On her initial physical examination her blood pressure was 157/120, HR 54  RR 25 and 02 saturation 95%. Lungs with rales bilaterally, with coarse breath sounds, no wheezing, heart with S1 and S2 present and regular with no gallops or rubs, abdomen with no distention and positive lower extremity edema.   Na 140, K 4.1 Cl 104 bicarbonate 24 glucose 178 bun 15 cr 1,0  BNP 586  Wbc 9,5 hgb 11.2 plt 224   Chest radiograph with left rotation, hypoinflation, positive cardiomegaly with bilateral hilar vascular congestion, mild fluid in the right fissure, with no effusions.   EKG 80 bpm, normal axis, normal intervals, qtc 445 sinus rhythm with PAC, with no significant ST segment or T wave changes.   06/14 improved volume status, positive anxiety and deconditioning  06/15 patient had atrial fibrillation with RVR, mild elevation of high sensitive troponin. At 12:40 converted back to sinus rhythm.  06/16 patient very weak and deconditioned, plan to transfer to SNF. Today she is medically stable.   Assessment and Plan: * Acute on chronic diastolic CHF (congestive heart failure) (HCC) Echocardiogram with preserved LV systolic function with EF 65 to 70%,  mild LVH, RV systolic function preserved,  LA and RA with moderate dilatation, mild to moderate tricuspid valve regurgitation, RVSP 44. mmHg. Cannot exclude small PFO.   Improved volume status Systolic blood pressure 160 mmHg range.   Continue spironolactone  and SGLT 2 inh to augment diuresis.  Continue with metoprolol  and continue with losartan Furosemide  20 mg daily.   Essential hypertension Continue blood pressure control with hydralazine  and losartan.  metoprolol  25 mg po bid.  Continue blood pressure monitoring   Paroxysmal atrial fibrillation (HCC) Continue amiodarone  and metoprolol .  Patient last night converted back to sinus rhythm.  Continue anticoagulation with apixaban .   AKI (acute kidney injury) (HCC) Stable renal function with serum cr at 1,17 with K at 4,4 and serum bicarbonate at 28  Na 137   Continue with furosemide  20 mg po daily.   Type 2 diabetes mellitus with hyperlipidemia (HCC) Continue glucose cover and monitoring with insulin  sliding scale and basal insulin   Her  glucose today is 205 mg/dl  Capillary 811, 914 and 132   GERD Continue pantoprazole .   Ascending aortic aneurysm (HCC) Continue blood pressure control with losartan and hydralazine  .  Continue with metoprolol  and furosemide    Anxiety state Continue with buspirone , add as needed lorazepam .       Subjective: Patient with no chest pain or dyspnea, her edema is back to baseline, she continue very weak and deconditioned, not yet back to her baseline.,   Physical Exam: Vitals:   07/17/23 0738 07/17/23 0902 07/17/23 0918 07/17/23 1149  BP: (!) 156/60 (!) 156/60 (!) 156/60 126/61  Pulse: 60  60   Resp: 19  20  Temp: 98.4 F (36.9 C)     TempSrc: Oral     SpO2: 95%     Weight:      Height:       Neurology awake and alert, deconditioned ENT with mild pallor Cardiovascular with S1 and S2 present and regular with no gallops, rubs or murmurs Respiratory with no rales or wheezing, no  rhonchi  Abdomen with no distention  Positive non pitting lower extremity edema, moderate, consistent with lymphedema.  Data Reviewed:    Family Communication: no family at the bedside   Disposition: Status is: Inpatient Remains inpatient appropriate because: pending transfer to SNF   Planned Discharge Destination: Skilled nursing facility     Author: Albertus Alt, MD 07/17/2023 2:57 PM  For on call review [email or web address].

## 2023-07-17 NOTE — Care Management Important Message (Signed)
 Important Message  Patient Details  Name: [name] MRN: [id number] Date of Birth: August 05, 1932   Important Message Given:  Yes - Medicare IM     Janith Melnick 07/17/2023, 10:04 AM

## 2023-07-17 NOTE — Progress Notes (Signed)
 Mobility Specialist Progress Note:   07/17/23 0935  Mobility  Activity Ambulated with assistance in room  Level of Assistance Contact guard assist, steadying assist  Assistive Device Front wheel walker  Distance Ambulated (ft) 15 ft  Activity Response Tolerated well  Mobility Referral Yes  Mobility visit 1 Mobility  Mobility Specialist Start Time (ACUTE ONLY) 0935  Mobility Specialist Stop Time (ACUTE ONLY) 0950  Mobility Specialist Time Calculation (min) (ACUTE ONLY) 15 min   Pt agreeable to mobility session. Required contact assist to ambulate around bed to chair. Pt c/o generalized weakness, no other complaints. Left in chair with all needs met.   Oneda Big Mobility Specialist Please contact via SecureChat or  Rehab office at [phone]

## 2023-07-17 NOTE — TOC PASRR Note (Signed)
 30 Day PASRR Note   Patient Details  Name: [name] Date of Birth: 1932/05/09   Transition of Care [facility]) CM/SW Contact:    Tandy Fam, LCSW Phone Number: 07/17/2023, 2:07 PM  To Whom It May Concern:  Please be advised that this patient will require a short-term nursing home stay - anticipated 30 days or less for rehabilitation and strengthening.   The plan is for return home.

## 2023-07-17 NOTE — Care Management Important Message (Signed)
 Important Message  Patient Details  Name: [name] MRN: [id number] Date of Birth: 25-Apr-1932   Important Message Given:  Yes - Medicare IM     Janith Melnick 07/17/2023, 10:12 AM

## 2023-07-17 NOTE — Progress Notes (Signed)
 Physical Therapy Treatment Patient Details Name: [name] MRN: [id number] DOB: 05-Mar-1932 Today's Date: 07/17/2023   History of Present Illness Patient is 89 yo female admitted on 07/13/23 for CHF exacerbation. PMH significant for T2DM, CAD, HTN, AAA, PVD, TIA, cerebral aneurysm s/p coiling, IBS, duodenitis, anxiety, afib.    PT Comments  Pt making good progress, but not ready to be alone in ALF apartment without assist.  Emphasis on general exercises including sit to stands and sitting exercise for U and LE's prior to gait to hall/back with RW and mild stability assist.  HR brady at 52-55 with minor rise with exercise and SpO2 mid 90's on RA.     If plan is discharge home, recommend the following: A little help with walking and/or transfers;A little help with bathing/dressing/bathroom;Direct supervision/assist for medications management;Assist for transportation   Can travel by private vehicle     No  Equipment Recommendations   (TBA next venue)    Recommendations for Other Services       Precautions / Restrictions Precautions Precautions: Fall Recall of Precautions/Restrictions: Intact Precaution/Restrictions Comments: monitor BP     Mobility  Bed Mobility               General bed mobility comments: up in the chair on arrival    Transfers Overall transfer level: Needs assistance Equipment used: Rolling walker (2 wheels) Transfers: Sit to/from Stand (x10  as exercise) Sit to Stand: Contact guard assist, Min assist                Ambulation/Gait Ambulation/Gait assistance: Contact guard assist Gait Distance (Feet): 35 Feet Assistive device: Rolling walker (2 wheels) Gait Pattern/deviations: Step-through pattern, Decreased stride length, Trunk flexed, Shuffle   Gait velocity interpretation: <1.31 ft/sec, indicative of household ambulator   General Gait Details: some mild dizziness that did not significantly interfere with gait  in the  [employer]             Wheelchair Mobility     Tilt Bed    Modified Rankin (Stroke Patients Only)       Balance     Sitting balance-Leahy Scale: Good     Standing balance support: Single extremity supported, Bilateral upper extremity supported Standing balance-Leahy Scale: Poor Standing balance comment: reliant on the RW                            Communication Communication Communication: Impaired;No apparent difficulties Factors Affecting Communication: Hearing impaired  Cognition Arousal: Alert Behavior During Therapy: Anxious   PT - Cognitive impairments: No apparent impairments                         Following commands: Intact      Cueing Cueing Techniques: Verbal cues  Exercises General Exercises - Lower Extremity Long Arc Quad: AROM, Both, 10 reps, Seated Hip Flexion/Marching: AROM, Both, 10 reps, Seated Other Exercises Other Exercises: sit to stand from chair height x10 Other Exercises: bicep/tricep presses with graded resistance x10 bil   rest between each exercise    General Comments        Pertinent Vitals/Pain Pain Assessment Pain Assessment: Faces Faces Pain Scale: Hurts a little bit Pain Location: head Pain Descriptors / Indicators: Aching Pain Intervention(s): Monitored during session    Home Living  Prior Function            PT Goals (current goals can now be found in the care plan section) Acute Rehab PT Goals PT Goal Formulation: With patient Time For Goal Achievement: 07/29/23 Potential to Achieve Goals: Good Progress towards PT goals: Progressing toward goals    Frequency    Min 2X/week      PT Plan      Co-evaluation              AM-PAC PT 6 Clicks Mobility   Outcome Measure  Help needed turning from your back to your side while in a flat bed without using bedrails?: A Little Help needed moving from lying on your back to sitting on the  side of a flat bed without using bedrails?: A Little Help needed moving to and from a bed to a chair (including a wheelchair)?: A Little Help needed standing up from a chair using your arms (e.g., wheelchair or bedside chair)?: A Little Help needed to walk in hospital room?: A Little Help needed climbing 3-5 steps with a railing? : A Lot 6 Click Score: 17    End of Session   Activity Tolerance: Patient tolerated treatment well Patient left: with call bell/phone within reach;in chair;Other (comment) Nurse Communication: Mobility status PT Visit Diagnosis: Difficulty in walking, not elsewhere classified (R26.2)     Time: 1610-9604 PT Time Calculation (min) (ACUTE ONLY): 34 min  Charges:    $Gait Training: 8-22 mins $Therapeutic Exercise: 8-22 mins PT General Charges $$ ACUTE PT VISIT: 1 Visit                     07/17/2023  Nohemi Batters., PT Acute Rehabilitation Services [phone]  (office)   Durell Gilding Kishan [place] 07/17/2023, 1:21 PM

## 2023-07-17 NOTE — TOC Initial Note (Signed)
 Transition of Care [employer]) - Initial/Assessment Note    Patient Details  Name: [name] MRN: [id number] Date of Birth: May 14, 1932  Transition of Care [facility]) CM/SW Contact:    Tandy Fam, LCSW Phone Number: 07/17/2023, 2:09 PM  Clinical Narrative:      CSW updated by MD that patient requesting SNF, does not feel ready to return to ALF. CSW coordinated with PT assigned, PT is in agreement. CSW completed referral and sent to [facility], awaiting review. Patient's PASRR went to manual review, CSW to upload documents to [facility] for review. CSW to follow.             Expected Discharge Plan: Skilled Nursing Facility Barriers to Discharge: Awaiting State Approval (PASRR), Continued Medical Work up, English as a second language teacher   Patient Goals and CMS Choice Patient states their goals for this hospitalization and ongoing recovery are:: to get rehab at [facility].gov Compare Post Acute Care list provided to:: Patient Choice offered to / list presented to : Patient [place] ownership interest in [facility].provided to:: Patient    Expected Discharge Plan and Services       Living arrangements for the past 2 months: Assisted Living Facility                                      Prior Living Arrangements/Services Living arrangements for the past 2 months: Assisted Living Facility Lives with:: Facility Resident Patient language and need for interpreter reviewed:: No        Need for Family Participation in Patient Care: No (Comment) Care giver support system in place?: No (comment)   Criminal Activity/Legal Involvement Pertinent to Current Situation/Hospitalization: No - Comment as needed  Activities of Daily Living   ADL Screening (condition at time of admission) Independently performs ADLs?: No Does the patient have a NEW difficulty with bathing/dressing/toileting/self-feeding that is expected to last >3 days?: Yes (Initiates electronic notice  to provider for possible OT consult) Does the patient have a NEW difficulty with getting in/out of bed, walking, or climbing stairs that is expected to last >3 days?: Yes (Initiates electronic notice to provider for possible PT consult) Does the patient have a NEW difficulty with communication that is expected to last >3 days?: No Is the patient deaf or have difficulty hearing?: Yes Does the patient have difficulty seeing, even when wearing glasses/contacts?: No Does the patient have difficulty concentrating, remembering, or making decisions?: No  Permission Sought/Granted Permission sought to share information with : Facility Industrial/product designer granted to share information with : Yes, Verbal Permission Granted     Permission granted to share info w AGENCY: SNF        Emotional Assessment Appearance:: Appears stated age Attitude/Demeanor/Rapport: Engaged Affect (typically observed): Appropriate Orientation: : Oriented to Self, Oriented to Place, Oriented to  Time, Oriented to Situation Alcohol / Substance Use: Not Applicable Psych Involvement: No (comment)  Admission diagnosis:  Peripheral edema [R60.0] CHF exacerbation (HCC) [I50.9] Other hypervolemia [E87.79] Patient Active Problem List   Diagnosis Date Noted   AKI (acute kidney injury) (HCC) 07/15/2023   CHF exacerbation (HCC) 07/13/2023   Primary hypertension 06/22/2023   Acute on chronic diastolic heart failure (HCC) 06/21/2023   Atrial fibrillation with rapid ventricular response (HCC) 06/18/2023   Atrial fibrillation with RVR (HCC) 05/19/2023   Hypomagnesemia 05/19/2023   Gastroenteritis 05/19/2023   Coronary artery calcification seen on CT  scan 09/26/2022   Mitral regurgitation 09/26/2022   Acute hypoxemic respiratory failure (HCC) 02/03/2022   Hypertensive urgency 02/03/2022   Acute on chronic diastolic CHF (congestive heart failure) (HCC) 02/03/2022   Nausea and vomiting 02/03/2022   Elevated  troponin 01/02/2022   Hyperlipidemia 01/02/2022   Old MI (myocardial infarction) 01/02/2022   Paroxysmal atrial fibrillation (HCC) 01/01/2022   Lung nodule 07/12/2021   Weakness 06/30/2021   Ankle edema, bilateral 01/21/2021   Sprain of left rotator cuff capsule 01/21/2021   Cervical radiculopathy due to degenerative joint disease of spine 01/21/2021   Left wrist pain 08/06/2019   Bicuspid aortic valve    Constipation 11/14/2017   Rectal bleeding 11/14/2017   Carotid stenosis    Heart palpitations 03/23/2017   Ascending aortic aneurysm (HCC) 12/07/2015   SOB (shortness of breath) 12/07/2015   Orthostatic hypotension 04/02/2014   Hematochezia 04/02/2014   Chest pain    Near syncope 04/01/2014   Type 2 diabetes mellitus with hyperlipidemia (HCC) 04/01/2014   Diverticulosis 04/01/2014   Unruptured cerebral aneurysm 01/08/2014   Multifactorial gait disorder 01/08/2014   Aneurysm, cerebral, nonruptured 08/07/2012   Amaurosis fugax 08/07/2012   Hereditary and idiopathic peripheral neuropathy 08/07/2012   Abnormal x-ray of lumbar spine 08/29/2010   Low back pain 08/24/2010   Anxiety state 03/26/2010   Essential hypertension 03/26/2010   Bilateral lower abdominal pain 03/26/2010   GERD 12/22/2008   Diarrhea 12/22/2008   IRON DEFICIENCY 05/22/2008   Irritable bowel syndrome 04/07/2008   History of colonic polyps 04/07/2008   PCP:  [facility] Pharmacy:   [facility] [address] [place] [address] Phone: [phone] Fax: [phone]  [facility] [place], [place] - [phone] [address] [place] [address] Phone: [phone] Fax: [phone]     Social Drivers of Health (SDOH) Social History: SDOH Screenings   Food Insecurity: No Food Insecurity (07/13/2023)  Housing: Low Risk  (07/13/2023)  Transportation Needs: No Transportation Needs (07/13/2023)  Utilities: Not At Risk  (07/13/2023)  Social Connections: Socially Isolated (07/13/2023)  Tobacco Use: Low Risk  (07/13/2023)   SDOH Interventions:     Readmission Risk Interventions    07/14/2023    3:33 PM  Readmission Risk Prevention Plan  Transportation Screening Complete  PCP or Specialist Appt within 3-5 Days Complete  HRI or Home Care Consult Complete  Social Work Consult for Recovery Care Planning/Counseling Complete  Palliative Care Screening Not Applicable  Medication Review Oceanographer) Complete

## 2023-07-17 NOTE — [place] FL2 (Signed)
 [name]  MEDICAID FL2 LEVEL OF CARE FORM     IDENTIFICATION  Patient Name: [name] Birthdate: [birth year] Sex: female Admission Date (Current Location): 07/13/2023  [facility] and [place] Number:  Producer, television/film/video and Address:  [facility], [address] [place], [place] [address]      Provider Number: [id number]  Attending Physician Name and Address:  Albertus Alt  Relative Name and Phone Number:       Current Level of Care: Hospital Recommended Level of Care: Skilled Nursing Facility Prior Approval Number:    Date Approved/Denied:   PASRR Number: Manual review  Discharge Plan: SNF    Current Diagnoses: Patient Active Problem List   Diagnosis Date Noted   AKI (acute kidney injury) (HCC) 07/15/2023   CHF exacerbation (HCC) 07/13/2023   Primary hypertension 06/22/2023   Acute on chronic diastolic heart failure (HCC) 06/21/2023   Atrial fibrillation with rapid ventricular response (HCC) 06/18/2023   Atrial fibrillation with RVR (HCC) 05/19/2023   Hypomagnesemia 05/19/2023   Gastroenteritis 05/19/2023   Coronary artery calcification seen on CT scan 09/26/2022   Mitral regurgitation 09/26/2022   Acute hypoxemic respiratory failure (HCC) 02/03/2022   Hypertensive urgency 02/03/2022   Acute on chronic diastolic CHF (congestive heart failure) (HCC) 02/03/2022   Nausea and vomiting 02/03/2022   Elevated troponin 01/02/2022   Hyperlipidemia 01/02/2022   Old MI (myocardial infarction) 01/02/2022   Paroxysmal atrial fibrillation (HCC) 01/01/2022   Lung nodule 07/12/2021   Weakness 06/30/2021   Ankle edema, bilateral 01/21/2021   Sprain of left rotator cuff capsule 01/21/2021   Cervical radiculopathy due to degenerative joint disease of spine 01/21/2021   Left wrist pain 08/06/2019   Bicuspid aortic valve    Constipation 11/14/2017   Rectal bleeding 11/14/2017   Carotid stenosis    Heart palpitations 03/23/2017    Ascending aortic aneurysm (HCC) 12/07/2015   SOB (shortness of breath) 12/07/2015   Orthostatic hypotension 04/02/2014   Hematochezia 04/02/2014   Chest pain    Near syncope 04/01/2014   Type 2 diabetes mellitus with hyperlipidemia (HCC) 04/01/2014   Diverticulosis 04/01/2014   Unruptured cerebral aneurysm 01/08/2014   Multifactorial gait disorder 01/08/2014   Aneurysm, cerebral, nonruptured 08/07/2012   Amaurosis fugax 08/07/2012   Hereditary and idiopathic peripheral neuropathy 08/07/2012   Abnormal x-ray of lumbar spine 08/29/2010   Low back pain 08/24/2010   Anxiety state 03/26/2010   Essential hypertension 03/26/2010   Bilateral lower abdominal pain 03/26/2010   GERD 12/22/2008   Diarrhea 12/22/2008   IRON DEFICIENCY 05/22/2008   Irritable bowel syndrome 04/07/2008   History of colonic polyps 04/07/2008    Orientation RESPIRATION BLADDER Height & Weight     Self, Time, Situation, Place  Normal Continent Weight: 154 lb 15.7 oz (70.3 kg) Height:  5' 4 (162.6 cm)  BEHAVIORAL SYMPTOMS/MOOD NEUROLOGICAL BOWEL NUTRITION STATUS      Continent Diet (heart healthy/carb modified)  AMBULATORY STATUS COMMUNICATION OF NEEDS Skin   Limited Assist Verbally Other (Comment) (closed head laceration, no dressing; moisture associated skin damage on buttocks, no dressing)                       Personal Care Assistance Level of Assistance  Bathing, Feeding, Dressing Bathing Assistance: Limited assistance Feeding assistance: Limited assistance Dressing Assistance: Limited assistance     Functional Limitations Info  Sight, Hearing Sight Info: Impaired Hearing Info: Impaired      SPECIAL CARE FACTORS  FREQUENCY  PT (By licensed PT), OT (By licensed OT)     PT Frequency: 5x/wk OT Frequency: 5x/wk            Contractures Contractures Info: Not present    Additional Factors Info  Code Status, Allergies, Psychotropic Code Status Info: Full Allergies Info: Amoxicillin ,  Fosamax (Alendronate Sodium), Ms Contin  (Morphine ), Pioglitazone, Rosiglitazone, Ultram  (Tramadol ), Atorvastatin, Glimepiride, Linagliptin, Metformin And Related, Repaglinide, Sitagliptin, Zoloft (Sertraline Hcl), Aspirin , Bentyl (Dicyclomine Hcl), Cephalexin, Cipro  (Ciprofloxacin  Hcl), Ciprofloxacin , Dicyclomine, Hctz (Hydrochlorothiazide ), Morphine  And Codeine, Norvasc  (Amlodipine  Besylate), Oxycontin (Oxycodone), Penicillin G, Sulfa Antibiotics, Penicillins, Sulfamethoxazole-trimethoprim Psychotropic Info: Buspar  5mg  2x/day; Ativan  0.5mg  every 6 hours PRN         Current Medications (07/17/2023):  This is the current hospital active medication list Current Facility-Administered Medications  Medication Dose Route Frequency Provider Last Rate Last Admin   acetaminophen  (TYLENOL ) tablet 500 mg  500 mg Oral Q6H PRN Garba, Mohammad L, MD   500 mg at 07/17/23 1213   alum & mag hydroxide-simeth (MAALOX/MYLANTA) 200-200-20 MG/5ML suspension 15 mL  15 mL Oral BID PRN Garba, Mohammad L, MD       amiodarone  (PACERONE ) tablet 200 mg  200 mg Oral Daily Sudie Ely L, MD   200 mg at 07/17/23 1610   apixaban  (ELIQUIS ) tablet 5 mg  5 mg Oral BID Sudie Ely L, MD   5 mg at 07/17/23 9604   busPIRone  (BUSPAR ) tablet 5 mg  5 mg Oral BID Davida Espy, MD   5 mg at 07/17/23 5409   Chlorhexidine Gluconate Cloth 2 % PADS 6 each  6 each Topical Daily Arrien, Mauricio Daniel, MD   6 each at 07/17/23 8119   diclofenac Sodium (VOLTAREN) 1 % topical gel 2 g  2 g Topical QID Arrien, Mauricio Daniel, MD   2 g at 07/17/23 1345   empagliflozin (JARDIANCE) tablet 10 mg  10 mg Oral Daily Arrien, Curlee Doss, MD   10 mg at 07/17/23 1478   furosemide  (LASIX ) tablet 20 mg  20 mg Oral Daily Arrien, Curlee Doss, MD   20 mg at 07/17/23 2956   hydrALAZINE  (APRESOLINE ) tablet 10 mg  10 mg Oral TID Garba, Mohammad L, MD   10 mg at 07/17/23 0902   insulin  aspart (novoLOG ) injection 0-15 Units  0-15 Units Subcutaneous  TID WC Davida Espy, MD   2 Units at 07/17/23 1206   insulin  aspart (novoLOG ) injection 0-5 Units  0-5 Units Subcutaneous QHS Davida Espy, MD       insulin  glargine-yfgn (SEMGLEE ) injection 18 Units  18 Units Subcutaneous Daily Garba, Mohammad L, MD   18 Units at 07/17/23 0919   LORazepam  (ATIVAN ) tablet 0.5 mg  0.5 mg Oral Q6H PRN Arrien, Mauricio Daniel, MD   0.5 mg at 07/17/23 2130   losartan (COZAAR) tablet 25 mg  25 mg Oral Daily Arrien, Curlee Doss, MD   25 mg at 07/17/23 8657   melatonin tablet 3 mg  3 mg Oral QHS Garba, Mohammad L, MD   3 mg at 07/16/23 2056   metoprolol  tartrate (LOPRESSOR ) 25 mg/10 mL oral suspension 25 mg  25 mg Oral BID Arrien, Mauricio Daniel, MD   25 mg at 07/17/23 8469   metoprolol  tartrate (LOPRESSOR ) injection 5 mg  5 mg Intravenous Q4H PRN Arrien, Mauricio Daniel, MD       multivitamin with minerals tablet 1 tablet  1 tablet Oral q AM Garba, Mohammad L, MD   1 tablet at  07/17/23 0902   ondansetron  (ZOFRAN ) tablet 4 mg  4 mg Oral Q8H PRN Davida Espy, MD   4 mg at 07/16/23 0734   pantoprazole  (PROTONIX ) EC tablet 40 mg  40 mg Oral Daily Garba, Mohammad L, MD   40 mg at 07/17/23 0903   polyethylene glycol (MIRALAX  / GLYCOLAX ) packet 17 g  17 g Oral Daily PRN Davida Espy, MD       sodium chloride  flush (NS) 0.9 % injection 10-40 mL  10-40 mL Intracatheter Q12H Arrien, Curlee Doss, MD   10 mL at 07/17/23 0905   sodium chloride  flush (NS) 0.9 % injection 3 mL  3 mL Intravenous Q12H Sudie Ely L, MD   3 mL at 07/17/23 0905   sodium chloride  flush (NS) 0.9 % injection 3 mL  3 mL Intravenous PRN Davida Espy, MD       spironolactone  (ALDACTONE ) tablet 12.5 mg  12.5 mg Oral Daily Davida Espy, MD   12.5 mg at 07/17/23 5409     Discharge Medications: Please see discharge summary for a list of discharge medications.  Relevant Imaging Results:  Relevant Lab Results:   Additional Information SS#: [id number]  Tandy Fam, LCSW

## 2023-07-18 ENCOUNTER — Ambulatory Visit: Admitting: Cardiology

## 2023-07-18 DIAGNOSIS — I480 Paroxysmal atrial fibrillation: Secondary | ICD-10-CM | POA: Diagnosis not present

## 2023-07-18 DIAGNOSIS — I10 Essential (primary) hypertension: Secondary | ICD-10-CM | POA: Diagnosis not present

## 2023-07-18 DIAGNOSIS — N179 Acute kidney failure, unspecified: Secondary | ICD-10-CM | POA: Diagnosis not present

## 2023-07-18 DIAGNOSIS — I5033 Acute on chronic diastolic (congestive) heart failure: Secondary | ICD-10-CM | POA: Diagnosis not present

## 2023-07-18 LAB — GLUCOSE, CAPILLARY
Glucose-Capillary: 129 mg/dL — ABNORMAL HIGH (ref 70–99)
Glucose-Capillary: 160 mg/dL — ABNORMAL HIGH (ref 70–99)
Glucose-Capillary: 116 mg/dL — ABNORMAL HIGH (ref 70–99)
Glucose-Capillary: 191 mg/dL — ABNORMAL HIGH (ref 70–99)

## 2023-07-18 MED ORDER — LORAZEPAM 0.5 MG PO TABS: 0.5000 mg | ORAL_TABLET | Freq: Four times a day (QID) | ORAL | 0 refills | Status: DC | PRN

## 2023-07-18 MED ORDER — FUROSEMIDE 20 MG PO TABS: 20.0000 mg | ORAL_TABLET | Freq: Every day | ORAL | 0 refills | Status: DC

## 2023-07-18 MED ORDER — LOSARTAN POTASSIUM 25 MG PO TABS: 25.0000 mg | ORAL_TABLET | Freq: Every day | ORAL | 0 refills | Status: AC

## 2023-07-18 MED ORDER — EMPAGLIFLOZIN 10 MG PO TABS: 10.0000 mg | ORAL_TABLET | Freq: Every day | ORAL | 0 refills | Status: DC

## 2023-07-18 MED ORDER — DICLOFENAC SODIUM 1 % EX GEL: 2.0000 g | Freq: Four times a day (QID) | CUTANEOUS | 0 refills | Status: AC | PRN

## 2023-07-18 NOTE — Discharge Summary (Addendum)
 Patient seen and examined, no changes from discharge summary as noted below by Dr. Sunnie England yesterday  Deforest Fast, MD   Physician Discharge Summary   Patient: [name] MRN: [id number] DOB: [birth year]  Admit date:     07/13/2023  Discharge date: 07/19/23      PCP: [facility]   Recommendations at discharge:    Patient has been placed on losartan and SGLT 2 inh, continue metoprolol  and spironolactone .  Furosemide  20 mg to take daily  Added lorazepam  as needed for anxiety.  Follow up renal function and electrolytes in 7 days Follow up with Primary Care in 7 to 10 days.   Discharge Diagnoses: Principal Problem:   Acute on chronic diastolic CHF (congestive heart failure) (HCC) Active Problems:   Essential hypertension   Paroxysmal atrial fibrillation (HCC)   AKI (acute kidney injury) (HCC)   Type 2 diabetes mellitus with hyperlipidemia (HCC)   GERD   Ascending aortic aneurysm (HCC)   Anxiety state  Resolved Problems:   * No resolved hospital problems. [facility] Course: [name] was admitted to the hospital with the working diagnosis of heart failure exacerbation.   89 yo female with the past medical history of ascending aortic aneurysm, T2DM, atrial fibrillation and iron deficiency anemia who presented with dyspnea and edema. Patient reported worsening exertional dyspnea, lower extremity edema, PND and orthopnea.  Recent hospitalization 05/18 to 06/23/23 for atrial fibrillation with rapid ventricular response. Patient was placed on amiodarone , converted to sinus rhythm and was discharged home with home health services to assisted living facility. On her initial physical examination her blood pressure was 157/120, HR 54  RR 25 and 02 saturation 95%. Lungs with rales bilaterally, with coarse breath sounds, no wheezing, heart with S1 and S2 present and regular with no gallops or rubs, abdomen with no distention and positive lower extremity edema.    Na 140, K 4.1 Cl 104 bicarbonate 24 glucose 178 bun 15 cr 1,0  BNP 586  Wbc 9,5 hgb 11.2 plt 224   Chest radiograph with left rotation, hypoinflation, positive cardiomegaly with bilateral hilar vascular congestion, mild fluid in the right fissure, with no effusions.   EKG 80 bpm, normal axis, normal intervals, qtc 445 sinus rhythm with PAC, with no significant ST segment or T wave changes.   06/14 improved volume status, positive anxiety and deconditioning  06/15 patient had atrial fibrillation with RVR, mild elevation of high sensitive troponin. At 12:40 converted back to sinus rhythm.  06/16 patient very weak and deconditioned, plan to transfer to SNF. Today she is medically stable.  06/17 patient is medically stable for transfer to SNF. Continue physical therapy and occupational therapy.   Assessment and Plan: * Acute on chronic diastolic CHF (congestive heart failure) (HCC) Echocardiogram with preserved LV systolic function with EF 65 to 70%, mild LVH, RV systolic function preserved,  LA and RA with moderate dilatation, mild to moderate tricuspid valve regurgitation, RVSP 44. mmHg. Cannot exclude small PFO.   Patient was placed on IV furosemide  for diuresis, negative fluid balance was achieved, -7894 ml, with significant improvement in her symptoms.   Continue spironolactone  and SGLT 2 inh.  Continue with metoprolol , hydralazine  and losartan Loop diuretic with Furosemide  20 mg daily.   Essential hypertension Continue blood pressure control with hydralazine , metoprolol  and losartan.   Paroxysmal atrial fibrillation (HCC) Continue amiodarone  and metoprolol .  Patient had paroxysmal atrial fibrillation during her hospitalization, now she has return to sinus rhythm.  Continue  anticoagulation with apixaban .   AKI (acute kidney injury) (HCC) Stable renal function with serum cr at 1,17 with K at 4,4 and serum bicarbonate at 28  Na 137   Continue with furosemide  20 mg po daily.    Type 2 diabetes mellitus with hyperlipidemia (HCC) Patient was placed on insulin  sliding scale for glucose cover and monitoring during her hospitalization,  Basal insulin  18 units.  Her glucose remained well controlled.    GERD Continue pantoprazole .   Ascending aortic aneurysm (HCC) Continue blood pressure control with losartan and hydralazine  .  Continue with metoprolol  and furosemide    Anxiety state Continue with buspirone , add as needed lorazepam .          Consultants: none  Procedures performed: none   Disposition: Skilled nursing facility Diet recommendation:  Cardiac and Carb modified diet DISCHARGE MEDICATION: Allergies as of 07/19/2023       Reactions   Amoxicillin  Other (See Comments)   Stomach upset Other Reaction(s): severe pain in side   Fosamax [alendronate Sodium] Other (See Comments)   Unknown reaction   Ms Contin  [morphine ] Other (See Comments)   Body spasms    Pioglitazone Other (See Comments)   Chronic pedal edema Other Reaction(s): (chronic pedal edema)   Rosiglitazone Other (See Comments)   Chronic pedal edema Other Reaction(s): (chronic pedal edema)   Ultram  [tramadol ] Shortness Of Breath, Palpitations   Atorvastatin Swelling   Myalgias Other Reaction(s): myalgias   Glimepiride Other (See Comments)   Unknown reaction Other Reaction(s): low blood sugars   Linagliptin Other (See Comments)   GI issues Other Reaction(s): GI upset   Metformin And Related Other (See Comments)   Gastritis    Repaglinide Other (See Comments)   Abdominal pain Other Reaction(s): abdominal pain   Sitagliptin Other (See Comments)   Unknown reaction Other Reaction(s): gastritis   Zoloft [sertraline Hcl] Other (See Comments)   Altered mental state   Aspirin  Other (See Comments)   Bleeding ulcers Other Reaction(s): stomach ulcers   Bentyl [dicyclomine Hcl] Other (See Comments)   Near syncope Weakness    Cephalexin Other (See Comments)   Unknown  reaction Other Reaction(s): unknown   Cipro  [ciprofloxacin  Hcl] Nausea And Vomiting, Other (See Comments)   Stomach upset   Ciprofloxacin  Nausea And Vomiting   Severe stomach upset Other Reaction(s): unknown   Dicyclomine    Other reaction(s): Unknown Other Reaction(s): unknown   Hctz Curran.Coup ] Other (See Comments)   Urinary issues   Morphine  And Codeine Other (See Comments)   Body spasms   Norvasc  [amlodipine  Besylate] Other (See Comments)   Weakness Dizziness    Oxycontin [oxycodone] Other (See Comments)   Unknown reaction   Penicillin G    Other Reaction(s): unknown   Sulfa Antibiotics Hives   Other Reaction(s): welps   Penicillins Swelling, Rash   Sulfamethoxazole-trimethoprim Hives, Rash        Medication List     TAKE these medications    acetaminophen  500 MG tablet Commonly known as: TYLENOL  Take 500 mg by mouth every 6 (six) hours as needed for mild pain, moderate pain, fever or headache.   aluminum -magnesium  hydroxide 200-200 MG/5ML suspension Take 15 mLs by mouth as needed for indigestion.   amiodarone  200 MG tablet Commonly known as: PACERONE  Take 1 tablet (200 mg total) by mouth daily.   apixaban  5 MG Tabs tablet Commonly known as: Eliquis  Take 1 tablet (5 mg total) by mouth 2 (two) times daily.   busPIRone  5 MG tablet Commonly known as:  BUSPAR  Take 5 mg by mouth 2 (two) times daily.   diclofenac Sodium 1 % Gel Commonly known as: VOLTAREN Apply 2 g topically 4 (four) times daily as needed. Bilateral shoulders   docusate sodium  100 MG capsule Commonly known as: COLACE Take 100 mg by mouth daily as needed for moderate constipation.   empagliflozin 10 MG Tabs tablet Commonly known as: JARDIANCE Take 1 tablet (10 mg total) by mouth daily.   furosemide  20 MG tablet Commonly known as: LASIX  Take 1 tablet (20 mg total) by mouth daily. What changed:  how much to take how to take this when to take this additional instructions    hydrALAZINE  10 MG tablet Commonly known as: APRESOLINE  Take 1 tablet (10 mg total) by mouth 3 (three) times daily.   insulin  glargine 100 UNIT/ML Solostar Pen Commonly known as: LANTUS  Inject 18 Units into the skin in the morning.   LORazepam  0.5 MG tablet Commonly known as: ATIVAN  Take 1 tablet (0.5 mg total) by mouth every 12 (twelve) hours as needed for anxiety.   losartan 25 MG tablet Commonly known as: COZAAR Take 1 tablet (25 mg total) by mouth daily.   melatonin 3 MG Tabs tablet Take 3 mg by mouth at bedtime.   metoprolol  tartrate 25 MG tablet Commonly known as: LOPRESSOR  Take 1 tablet (25 mg total) by mouth 2 (two) times daily.   multivitamin tablet Take 1 tablet by mouth in the morning.   ondansetron  4 MG tablet Commonly known as: ZOFRAN  Take 1 tablet (4 mg total) by mouth every 8 (eight) hours as needed for nausea or vomiting.   pantoprazole  40 MG tablet Commonly known as: PROTONIX  TAKE 1 TABLET BY MOUTH EVERY DAY BEFORE BREAKFAST What changed: See the new instructions.   polyethylene glycol 17 g packet Commonly known as: MIRALAX  / GLYCOLAX  Take 17 g by mouth daily as needed for moderate constipation.   spironolactone  25 MG tablet Commonly known as: ALDACTONE  Take 0.5 tablets (12.5 mg total) by mouth daily.        Contact information for after-discharge care     Destination     [facility], [place] .   Service: Skilled Nursing Contact information: [address] [place]  [phone]                    Discharge Exam: [name] Weights   07/17/23 0512 07/18/23 0425 07/19/23 0021  Weight: 70.3 kg 72.4 kg 71.7 kg   BP (!) 148/73   Pulse (!) 56   Temp 97.6 F (36.4 C) (Oral)   Resp (!) 22   Ht 5' 4 (1.626 m)   Wt 71.7 kg   SpO2 98%   BMI 27.13 kg/m   Patient is feeling better, has no dyspnea and lower extremity edema is back to baseline, no orthopnea or PND, continue very weak and  deconditioned  Neurology awake and alert ENT with mild pallor with no icterus Cardiovascular with S1 and S2 present and regular with no gallops, rubs or murmurs Respiratory with no rales or wheezing, no rhonchi  Abdomen with no distention  No ankle edema, positive bilateral lower extremity lymphedema, at baseline    Condition at discharge: stable  The results of significant diagnostics from this hospitalization (including imaging, microbiology, ancillary and laboratory) are listed below for reference.   Imaging Studies: DG Chest Portable 1 View Result Date: 07/13/2023 CLINICAL DATA:  dyspnea EXAM: PORTABLE CHEST - 1 VIEW COMPARISON:  Jun 18, 2023 FINDINGS: Elevation of the right hemidiaphragm. Retrocardiac airspace consolidation. No pneumothorax or right pleural effusion. Unchanged cardiomegaly. Tortuous aorta with aortic atherosclerosis. No acute fracture or destructive lesions. Multilevel thoracic osteophytosis. Diffuse osteopenia. IMPRESSION: Retrocardiac airspace consolidation, which may represent atelectasis or a developing pneumonia, in the correct clinical context. Electronically Signed   By: Rance Burrows M.D.   On: 07/13/2023 17:14    Microbiology: Results for orders placed or performed during the hospital encounter of 06/18/23  Urine Culture     Status: None   Collection Time: 06/18/23  7:35 AM   Specimen: Urine, Clean Catch  Result Value Ref Range Status   Specimen Description URINE, CLEAN CATCH  Final   Special Requests NONE  Final   Culture   Final    NO GROWTH Performed at [facility], [address] [place], [place] [address]    Report Status 06/19/2023 FINAL  Final    Labs: CBC: Recent Labs  Lab 07/13/23 1609 07/16/23 0617  WBC 9.5 10.4  NEUTROABS 5.0 3.9  HGB 11.2* 11.7*  HCT 36.0 37.0  MCV 78.8* 76.0*  PLT 224 236   Basic Metabolic Panel: Recent Labs  Lab 07/13/23 1609 07/14/23 0224 07/15/23 0227 07/16/23 0510 07/17/23 0830  NA 140  144 138 139 137  K 4.1 4.1 3.6 4.2 4.4  CL 104 102 97* 98 98  CO2 24 32 31 30 28   GLUCOSE 178* 150* 161* 149* 205*  BUN 15 14 30* 34* 29*  CREATININE 1.03* 1.07* 1.34* 1.18* 1.17*  CALCIUM 8.9 8.9 8.9 8.6* 8.7*  MG  --   --  1.8 2.4  --    Liver Function Tests: No results for input(s): AST, ALT, ALKPHOS, BILITOT, PROT, ALBUMIN in the last 168 hours. CBG: Recent Labs  Lab 07/18/23 0609 07/18/23 1107 07/18/23 1600 07/18/23 2101 07/19/23 0613  GLUCAP 129* 160* 116* 191* 123*    Discharge time spent: greater than 30 minutes.  Signed: Rudie Cory, MD [facility]

## 2023-07-18 NOTE — Progress Notes (Signed)
 PT Cancellation Note  Patient Details Name: [name] MRN: [id number] DOB: [birth year]   Cancelled Treatment:    Reason Eval/Treat Not Completed: Patient declined, no reason specified;Fatigue/lethargy limiting ability to participate;Pain limiting ability to participate (Pt politely refused PT treatment. She reported just recently getting back to bed from using the bathroom and being too tired to participate in any activities and c/o 6/10 shoulder and head pain. Will follow-up as schedule permits.)  Glenford Lanes, PT, DPT Acute Rehabilitation Services Office: [phone] Secure Chat Preferred  Riva Chester 07/18/2023, 1:06 PM

## 2023-07-18 NOTE — Progress Notes (Signed)
 Occupational Therapy Treatment Patient Details Name: [name] MRN: [id number] DOB: 27-Jun-1932 Today's Date: 07/18/2023   History of present illness Patient is 89 yo female admitted on 07/13/23 for CHF exacerbation. PMH significant for T2DM, CAD, HTN, AAA, PVD, TIA, cerebral aneurysm s/p coiling, IBS, duodenitis, anxiety, afib.   OT comments  Patient received in supine and receiving anxiety medication. Patient agreeable to OT treatment. Patient able to get to EOB with verbal cues and min assist to complete. Gown donned to cover back and socks changed while seated on EOB with min assist. Patient was CGA to transfer to recliner and stated she felt anxious and did not want to do any more mobility at this time. Patient agreeable to discuss CHF warning signs and reviewed CHF handout. Discharge recommendations now for SNF due to patient could benefit from continued OT intervention.  Patient will benefit from continued inpatient follow up therapy, <3 hours/day.  Acute OT to continue to follow to address established goals to facilitate DC to next venue of care.        If plan is discharge home, recommend the following:  A little help with walking and/or transfers;A little help with bathing/dressing/bathroom   Equipment Recommendations  None recommended by OT    Recommendations for Other Services      Precautions / Restrictions Precautions Precautions: Fall Recall of Precautions/Restrictions: Intact Precaution/Restrictions Comments: monitor BP Restrictions Weight Bearing Restrictions Per Provider Order: No       Mobility Bed Mobility Overal bed mobility: Needs Assistance Bed Mobility: Supine to Sit     Supine to sit: Min assist, HOB elevated     General bed mobility comments: min assist to scoot towards EOB    Transfers Overall transfer level: Needs assistance Equipment used: Rolling walker (2 wheels) Transfers: Sit to/from Stand, Bed to chair/wheelchair/BSC Sit to Stand:  Contact guard assist     Step pivot transfers: Contact guard assist     General transfer comment: cues for hand placement and CGA to steady     Balance Overall balance assessment: Needs assistance Sitting-balance support: Feet supported Sitting balance-Leahy Scale: Good Sitting balance - Comments: EOB   Standing balance support: Bilateral upper extremity supported, During functional activity Standing balance-Leahy Scale: Poor Standing balance comment: reliant on the RW                           ADL either performed or assessed with clinical judgement   ADL Overall ADL's : Needs assistance/impaired     Grooming: Wash/dry hands;Wash/dry face;Brushing hair;Set up;Sitting Grooming Details (indicate cue type and reason): declined standing to perform grooming         Upper Body Dressing : Set up;Sitting Upper Body Dressing Details (indicate cue type and reason): gown for back Lower Body Dressing: Minimal assistance;Sitting/lateral leans Lower Body Dressing Details (indicate cue type and reason): socks and slippers Toilet Transfer: Contact guard assist;Rolling walker (2 wheels)           Functional mobility during ADLs: Contact guard assist;Rolling walker (2 wheels) General ADL Comments: Patient stated increased anxiety once up in recliner and declined further mobility or self care    Extremity/Trunk Assessment              Vision       Perception     Praxis     Communication Communication Communication: Impaired;No apparent difficulties Factors Affecting Communication: Hearing impaired   Cognition Arousal: Alert Behavior During Therapy: Anxious  Cognition: No apparent impairments             OT - Cognition Comments: Patient receiving anxiety medication during treatment                 Following commands: Intact        Cueing   Cueing Techniques: Verbal cues  Exercises      Shoulder Instructions       General Comments  Discussed CHF signs of exacerbation and reviewed handout    Pertinent Vitals/ Pain       Pain Assessment Pain Assessment: No/denies pain Pain Intervention(s): Monitored during session  Home Living                                          Prior Functioning/Environment              Frequency  Min 2X/week        Progress Toward Goals  OT Goals(current goals can now be found in the care plan section)  Progress towards OT goals: Progressing toward goals  Acute Rehab OT Goals Patient Stated Goal: to get more rehab OT Goal Formulation: With patient Time For Goal Achievement: 07/29/23 Potential to Achieve Goals: Good ADL Goals Pt Will Perform Grooming: with supervision;standing Pt Will Perform Lower Body Dressing: with supervision;sit to/from stand Pt Will Transfer to Toilet: with supervision;ambulating;regular height toilet Pt Will Perform Toileting - Clothing Manipulation and hygiene: with supervision;sit to/from stand Additional ADL Goal #1: Pt will verbalize at least 3 signs of CHF exacerbation to increase health literacy and promote safety upon d/c  Plan      Co-evaluation                 AM-PAC OT 6 Clicks Daily Activity     Outcome Measure   Help from another person eating meals?: None Help from another person taking care of personal grooming?: A Little Help from another person toileting, which includes using toliet, bedpan, or urinal?: A Little Help from another person bathing (including washing, rinsing, drying)?: A Little Help from another person to put on and taking off regular upper body clothing?: A Little Help from another person to put on and taking off regular lower body clothing?: A Little 6 Click Score: 19    End of Session Equipment Utilized During Treatment: Rolling walker (2 wheels)  OT Visit Diagnosis: Unsteadiness on feet (R26.81);Other abnormalities of gait and mobility (R26.89);Muscle weakness (generalized)  (M62.81)   Activity Tolerance Patient tolerated treatment well   Patient Left in chair;with call bell/phone within reach;with chair alarm set   Nurse Communication Mobility status        Time: 8413-2440 OT Time Calculation (min): 24 min  Charges: OT General Charges $OT Visit: 1 Visit OT Treatments $Self Care/Home Management : 8-22 mins $Therapeutic Activity: 8-22 mins  Anitra Barn, OTA Acute Rehabilitation Services  Office [phone]   Jovita Nipper 07/18/2023, 12:08 PM

## 2023-07-18 NOTE — TOC Progression Note (Addendum)
 Transition of Care [facility]) - Progression Note    Patient Details  Name: [name] MRN: [id number] Date of Birth: [birth year]  Transition of Care [facility]) CM/SW Contact  Arron Big, [place] Phone Number: 07/18/2023, 9:22 AM  Clinical Narrative:   Sherel Dikes: [phone] E , approval dates 07/17/2023-08/16/2023  12:49 PM [facility] can accept patient and she is agreeable to going there. CSW asked patient if she would like me to update her family. Patient stated she will update her family herself.   2:06 PM CSW submitted for ins [employer] id [id number].   2:27 PM Insurance auth approved; approval dates 07/19/2023-07/21/2023; Facility notified.   TOC will continue to follow.    Expected Discharge Plan: Skilled Nursing Facility Barriers to Discharge: Awaiting State Approval (PASRR), Continued Medical Work up, English as a second language teacher  Expected Discharge Plan and Services       Living arrangements for the past 2 months: Assisted Living Facility                                       Social Determinants of Health (SDOH) Interventions SDOH Screenings   Food Insecurity: No Food Insecurity (07/13/2023)  Housing: Low Risk  (07/13/2023)  Transportation Needs: No Transportation Needs (07/13/2023)  Utilities: Not At Risk (07/13/2023)  Social Connections: Socially Isolated (07/13/2023)  Tobacco Use: Low Risk  (07/13/2023)    Readmission Risk Interventions    07/14/2023    3:33 PM  Readmission Risk Prevention Plan  Transportation Screening Complete  PCP or Specialist Appt within 3-5 Days Complete  HRI or Home Care Consult Complete  Social Work Consult for Recovery Care Planning/Counseling Complete  Palliative Care Screening Not Applicable  Medication Review Oceanographer) Complete

## 2023-07-19 DIAGNOSIS — E441 Mild protein-calorie malnutrition: Secondary | ICD-10-CM | POA: Diagnosis not present

## 2023-07-19 DIAGNOSIS — L908 Other atrophic disorders of skin: Secondary | ICD-10-CM | POA: Diagnosis not present

## 2023-07-19 DIAGNOSIS — M6281 Muscle weakness (generalized): Secondary | ICD-10-CM | POA: Diagnosis not present

## 2023-07-19 DIAGNOSIS — E119 Type 2 diabetes mellitus without complications: Secondary | ICD-10-CM | POA: Diagnosis not present

## 2023-07-19 DIAGNOSIS — F411 Generalized anxiety disorder: Secondary | ICD-10-CM | POA: Diagnosis not present

## 2023-07-19 DIAGNOSIS — I951 Orthostatic hypotension: Secondary | ICD-10-CM | POA: Diagnosis not present

## 2023-07-19 DIAGNOSIS — R001 Bradycardia, unspecified: Secondary | ICD-10-CM | POA: Diagnosis not present

## 2023-07-19 DIAGNOSIS — R41841 Cognitive communication deficit: Secondary | ICD-10-CM | POA: Diagnosis not present

## 2023-07-19 DIAGNOSIS — D509 Iron deficiency anemia, unspecified: Secondary | ICD-10-CM | POA: Diagnosis not present

## 2023-07-19 DIAGNOSIS — I10 Essential (primary) hypertension: Secondary | ICD-10-CM | POA: Diagnosis not present

## 2023-07-19 DIAGNOSIS — I5033 Acute on chronic diastolic (congestive) heart failure: Secondary | ICD-10-CM | POA: Diagnosis not present

## 2023-07-19 DIAGNOSIS — I480 Paroxysmal atrial fibrillation: Secondary | ICD-10-CM | POA: Diagnosis not present

## 2023-07-19 DIAGNOSIS — Z7401 Bed confinement status: Secondary | ICD-10-CM | POA: Diagnosis not present

## 2023-07-19 DIAGNOSIS — R2689 Other abnormalities of gait and mobility: Secondary | ICD-10-CM | POA: Diagnosis not present

## 2023-07-19 DIAGNOSIS — K219 Gastro-esophageal reflux disease without esophagitis: Secondary | ICD-10-CM | POA: Diagnosis not present

## 2023-07-19 DIAGNOSIS — R1312 Dysphagia, oropharyngeal phase: Secondary | ICD-10-CM | POA: Diagnosis not present

## 2023-07-19 DIAGNOSIS — I2510 Atherosclerotic heart disease of native coronary artery without angina pectoris: Secondary | ICD-10-CM | POA: Diagnosis not present

## 2023-07-19 DIAGNOSIS — I509 Heart failure, unspecified: Secondary | ICD-10-CM | POA: Diagnosis not present

## 2023-07-19 LAB — GLUCOSE, CAPILLARY
Glucose-Capillary: 123 mg/dL — ABNORMAL HIGH (ref 70–99)
Glucose-Capillary: 174 mg/dL — ABNORMAL HIGH (ref 70–99)

## 2023-07-19 MED ORDER — HYDRALAZINE HCL 20 MG/ML IJ SOLN
5.0000 mg | Freq: Once | INTRAMUSCULAR | Status: AC
  Administered 2023-07-19: 5 mg via INTRAVENOUS
  Filled 2023-07-19: qty 1

## 2023-07-19 MED ORDER — LORAZEPAM 0.5 MG PO TABS: 0.5000 mg | ORAL_TABLET | Freq: Two times a day (BID) | ORAL | 0 refills | Status: AC | PRN

## 2023-07-19 NOTE — Progress Notes (Signed)
 Attempted to call [facility] twice; transferred to the nurse getting this patient twice and no answer.

## 2023-07-19 NOTE — TOC Transition Note (Signed)
 Transition of Care [facility]) - Discharge Note   Patient Details  Name: [name] MRN: [id number] Date of Birth: 1932-06-22  [facility]) CM/SW Contact:  Arron Big, LCSWA Phone Number: 07/19/2023, 12:13 PM   Clinical Narrative:   Patient will DC to: [facility] SNF Anticipated DC date: 07/19/23  Family notified: Patient declined  Transport by: Lyna Sandhoff   Per MD patient ready for DC to . RN to call report prior to discharge [phone]; room 101B). RN, patient, and facility notified of DC. Discharge Summary and FL2 sent to facility. DC packet on chart. Ambulance transport requested for patient 12:14 PM.   CSW will sign off for now as social work intervention is no longer needed. Please consult us  again if new needs arise.      Final next level of care: Skilled Nursing Facility Barriers to Discharge: Barriers Resolved   Patient Goals and CMS Choice Patient states their goals for this hospitalization and ongoing recovery are:: to get rehab at [facility].gov Compare Post Acute Care list provided to:: Patient Choice offered to / list presented to : Patient [place] ownership interest in [facility].provided to:: Patient    Discharge Placement              Patient chooses bed at: [facility] Patient to be transferred to facility by: [facility] Name of family member notified: Patient stated she wants to inform family, expressed for CSW to not contact them Patient and family notified of of transfer: 07/19/23  Discharge Plan and Services Additional resources added to the After Visit Summary for                                       Social Drivers of Health (SDOH) Interventions SDOH Screenings   Food Insecurity: No Food Insecurity (07/13/2023)  Housing: Low Risk  (07/13/2023)  Transportation Needs: No Transportation Needs (07/13/2023)  Utilities: Not At Risk (07/13/2023)  Social Connections: Socially Isolated  (07/13/2023)  Tobacco Use: Low Risk  (07/13/2023)     Readmission Risk Interventions    07/14/2023    3:33 PM  Readmission Risk Prevention Plan  Transportation Screening Complete  PCP or Specialist Appt within 3-5 Days Complete  HRI or Home Care Consult Complete  Social Work Consult for Recovery Care Planning/Counseling Complete  Palliative Care Screening Not Applicable  Medication Review Oceanographer) Complete

## 2023-07-19 NOTE — Progress Notes (Signed)
 Pt seen and examined, no changes from DC summary as noted by Dr.Arrien yesterday  Deforest Fast, MD

## 2023-07-19 NOTE — Plan of Care (Signed)
  Problem: Education: Goal: Knowledge of General Education information will improve Description: Including pain rating scale, medication(s)/side effects and non-pharmacologic comfort measures Outcome: Adequate for Discharge   Problem: Health Behavior/Discharge Planning: Goal: Ability to manage health-related needs will improve Outcome: Adequate for Discharge   Problem: Clinical Measurements: Goal: Ability to maintain clinical measurements within normal limits will improve Outcome: Adequate for Discharge Goal: Will remain free from infection Outcome: Adequate for Discharge Goal: Diagnostic test results will improve Outcome: Adequate for Discharge Goal: Respiratory complications will improve Outcome: Adequate for Discharge Goal: Cardiovascular complication will be avoided Outcome: Adequate for Discharge   Problem: Activity: Goal: Risk for activity intolerance will decrease Outcome: Adequate for Discharge   Problem: Nutrition: Goal: Adequate nutrition will be maintained Outcome: Adequate for Discharge   Problem: Coping: Goal: Level of anxiety will decrease Outcome: Adequate for Discharge   Problem: Elimination: Goal: Will not experience complications related to bowel motility Outcome: Adequate for Discharge Goal: Will not experience complications related to urinary retention Outcome: Adequate for Discharge   Problem: Pain Managment: Goal: General experience of comfort will improve and/or be controlled Outcome: Adequate for Discharge   Problem: Safety: Goal: Ability to remain free from injury will improve Outcome: Adequate for Discharge   Problem: Skin Integrity: Goal: Risk for impaired skin integrity will decrease Outcome: Adequate for Discharge   Problem: Education: Goal: Ability to describe self-care measures that may prevent or decrease complications (Diabetes Survival Skills Education) will improve Outcome: Adequate for Discharge Goal: Individualized Educational  Video(s) Outcome: Adequate for Discharge   Problem: Coping: Goal: Ability to adjust to condition or change in health will improve Outcome: Adequate for Discharge   Problem: Fluid Volume: Goal: Ability to maintain a balanced intake and output will improve Outcome: Adequate for Discharge   Problem: Health Behavior/Discharge Planning: Goal: Ability to identify and utilize available resources and services will improve Outcome: Adequate for Discharge Goal: Ability to manage health-related needs will improve Outcome: Adequate for Discharge   Problem: Metabolic: Goal: Ability to maintain appropriate glucose levels will improve Outcome: Adequate for Discharge   Problem: Nutritional: Goal: Maintenance of adequate nutrition will improve Outcome: Adequate for Discharge Goal: Progress toward achieving an optimal weight will improve Outcome: Adequate for Discharge   Problem: Skin Integrity: Goal: Risk for impaired skin integrity will decrease Outcome: Adequate for Discharge   Problem: Tissue Perfusion: Goal: Adequacy of tissue perfusion will improve Outcome: Adequate for Discharge   Problem: Education: Goal: Ability to demonstrate management of disease process will improve Outcome: Adequate for Discharge Goal: Ability to verbalize understanding of medication therapies will improve Outcome: Adequate for Discharge Goal: Individualized Educational Video(s) Outcome: Adequate for Discharge   Problem: Activity: Goal: Capacity to carry out activities will improve Outcome: Adequate for Discharge   Problem: Cardiac: Goal: Ability to achieve and maintain adequate cardiopulmonary perfusion will improve Outcome: Adequate for Discharge

## 2023-07-20 DIAGNOSIS — I10 Essential (primary) hypertension: Secondary | ICD-10-CM | POA: Diagnosis not present

## 2023-07-20 DIAGNOSIS — K219 Gastro-esophageal reflux disease without esophagitis: Secondary | ICD-10-CM | POA: Diagnosis not present

## 2023-07-20 DIAGNOSIS — I480 Paroxysmal atrial fibrillation: Secondary | ICD-10-CM | POA: Diagnosis not present

## 2023-07-20 DIAGNOSIS — F411 Generalized anxiety disorder: Secondary | ICD-10-CM | POA: Diagnosis not present

## 2023-07-20 DIAGNOSIS — E119 Type 2 diabetes mellitus without complications: Secondary | ICD-10-CM | POA: Diagnosis not present

## 2023-07-20 DIAGNOSIS — I509 Heart failure, unspecified: Secondary | ICD-10-CM | POA: Diagnosis not present

## 2023-07-21 DIAGNOSIS — I2510 Atherosclerotic heart disease of native coronary artery without angina pectoris: Secondary | ICD-10-CM | POA: Diagnosis not present

## 2023-07-21 DIAGNOSIS — L908 Other atrophic disorders of skin: Secondary | ICD-10-CM | POA: Diagnosis not present

## 2023-07-21 DIAGNOSIS — I10 Essential (primary) hypertension: Secondary | ICD-10-CM | POA: Diagnosis not present

## 2023-07-21 DIAGNOSIS — I480 Paroxysmal atrial fibrillation: Secondary | ICD-10-CM | POA: Diagnosis not present

## 2023-07-21 DIAGNOSIS — I5033 Acute on chronic diastolic (congestive) heart failure: Secondary | ICD-10-CM | POA: Diagnosis not present

## 2023-07-24 ENCOUNTER — Encounter: Payer: Self-pay | Admitting: Cardiology

## 2023-07-24 DIAGNOSIS — F411 Generalized anxiety disorder: Secondary | ICD-10-CM | POA: Diagnosis not present

## 2023-07-25 ENCOUNTER — Other Ambulatory Visit: Payer: Self-pay

## 2023-07-25 NOTE — Progress Notes (Signed)
 Medication list update. Jardiance  removed from list.

## 2023-07-25 NOTE — Telephone Encounter (Signed)
 Faxed through [facility] fax to [facility] [phone].

## 2023-07-26 DIAGNOSIS — M6281 Muscle weakness (generalized): Secondary | ICD-10-CM | POA: Diagnosis not present

## 2023-07-26 DIAGNOSIS — E441 Mild protein-calorie malnutrition: Secondary | ICD-10-CM | POA: Diagnosis not present

## 2023-07-26 DIAGNOSIS — I5033 Acute on chronic diastolic (congestive) heart failure: Secondary | ICD-10-CM | POA: Diagnosis not present

## 2023-07-26 DIAGNOSIS — F411 Generalized anxiety disorder: Secondary | ICD-10-CM | POA: Diagnosis not present

## 2023-07-26 DIAGNOSIS — R1312 Dysphagia, oropharyngeal phase: Secondary | ICD-10-CM | POA: Diagnosis not present

## 2023-07-26 DIAGNOSIS — I480 Paroxysmal atrial fibrillation: Secondary | ICD-10-CM | POA: Diagnosis not present

## 2023-07-26 DIAGNOSIS — R2689 Other abnormalities of gait and mobility: Secondary | ICD-10-CM | POA: Diagnosis not present

## 2023-08-01 DIAGNOSIS — I2510 Atherosclerotic heart disease of native coronary artery without angina pectoris: Secondary | ICD-10-CM | POA: Diagnosis not present

## 2023-08-01 DIAGNOSIS — I10 Essential (primary) hypertension: Secondary | ICD-10-CM | POA: Diagnosis not present

## 2023-08-01 DIAGNOSIS — I509 Heart failure, unspecified: Secondary | ICD-10-CM | POA: Diagnosis not present

## 2023-08-01 DIAGNOSIS — I480 Paroxysmal atrial fibrillation: Secondary | ICD-10-CM | POA: Diagnosis not present

## 2023-08-01 DIAGNOSIS — F411 Generalized anxiety disorder: Secondary | ICD-10-CM | POA: Diagnosis not present

## 2023-08-01 DIAGNOSIS — E119 Type 2 diabetes mellitus without complications: Secondary | ICD-10-CM | POA: Diagnosis not present

## 2023-08-07 DIAGNOSIS — I5033 Acute on chronic diastolic (congestive) heart failure: Secondary | ICD-10-CM | POA: Diagnosis not present

## 2023-08-07 DIAGNOSIS — S31829A Unspecified open wound of left buttock, initial encounter: Secondary | ICD-10-CM | POA: Diagnosis not present

## 2023-08-07 DIAGNOSIS — F419 Anxiety disorder, unspecified: Secondary | ICD-10-CM | POA: Diagnosis not present

## 2023-08-07 DIAGNOSIS — I130 Hypertensive heart and chronic kidney disease with heart failure and stage 1 through stage 4 chronic kidney disease, or unspecified chronic kidney disease: Secondary | ICD-10-CM | POA: Diagnosis not present

## 2023-08-07 DIAGNOSIS — F4321 Adjustment disorder with depressed mood: Secondary | ICD-10-CM | POA: Diagnosis not present

## 2023-08-07 DIAGNOSIS — I4891 Unspecified atrial fibrillation: Secondary | ICD-10-CM | POA: Diagnosis not present

## 2023-08-07 DIAGNOSIS — M170 Bilateral primary osteoarthritis of knee: Secondary | ICD-10-CM | POA: Diagnosis not present

## 2023-08-07 DIAGNOSIS — S31819A Unspecified open wound of right buttock, initial encounter: Secondary | ICD-10-CM | POA: Diagnosis not present

## 2023-08-07 DIAGNOSIS — R609 Edema, unspecified: Secondary | ICD-10-CM | POA: Diagnosis not present

## 2023-08-07 DIAGNOSIS — N179 Acute kidney failure, unspecified: Secondary | ICD-10-CM | POA: Diagnosis not present

## 2023-08-09 DIAGNOSIS — I5032 Chronic diastolic (congestive) heart failure: Secondary | ICD-10-CM | POA: Diagnosis not present

## 2023-08-09 DIAGNOSIS — N179 Acute kidney failure, unspecified: Secondary | ICD-10-CM | POA: Diagnosis not present

## 2023-08-09 DIAGNOSIS — I110 Hypertensive heart disease with heart failure: Secondary | ICD-10-CM | POA: Diagnosis not present

## 2023-08-09 DIAGNOSIS — I7121 Aneurysm of the ascending aorta, without rupture: Secondary | ICD-10-CM | POA: Diagnosis not present

## 2023-08-09 DIAGNOSIS — F411 Generalized anxiety disorder: Secondary | ICD-10-CM | POA: Diagnosis not present

## 2023-08-09 DIAGNOSIS — E119 Type 2 diabetes mellitus without complications: Secondary | ICD-10-CM | POA: Diagnosis not present

## 2023-08-09 DIAGNOSIS — W19XXXA Unspecified fall, initial encounter: Secondary | ICD-10-CM | POA: Diagnosis not present

## 2023-08-09 DIAGNOSIS — M25552 Pain in left hip: Secondary | ICD-10-CM | POA: Diagnosis not present

## 2023-08-09 DIAGNOSIS — I2510 Atherosclerotic heart disease of native coronary artery without angina pectoris: Secondary | ICD-10-CM | POA: Diagnosis not present

## 2023-08-09 DIAGNOSIS — I340 Nonrheumatic mitral (valve) insufficiency: Secondary | ICD-10-CM | POA: Diagnosis not present

## 2023-08-09 DIAGNOSIS — I480 Paroxysmal atrial fibrillation: Secondary | ICD-10-CM | POA: Diagnosis not present

## 2023-08-09 DIAGNOSIS — M25562 Pain in left knee: Secondary | ICD-10-CM | POA: Diagnosis not present

## 2023-08-09 DIAGNOSIS — M79652 Pain in left thigh: Secondary | ICD-10-CM | POA: Diagnosis not present

## 2023-08-09 DIAGNOSIS — I671 Cerebral aneurysm, nonruptured: Secondary | ICD-10-CM | POA: Diagnosis not present

## 2023-08-11 DIAGNOSIS — I340 Nonrheumatic mitral (valve) insufficiency: Secondary | ICD-10-CM | POA: Diagnosis not present

## 2023-08-11 DIAGNOSIS — I2510 Atherosclerotic heart disease of native coronary artery without angina pectoris: Secondary | ICD-10-CM | POA: Diagnosis not present

## 2023-08-11 DIAGNOSIS — I671 Cerebral aneurysm, nonruptured: Secondary | ICD-10-CM | POA: Diagnosis not present

## 2023-08-11 DIAGNOSIS — I480 Paroxysmal atrial fibrillation: Secondary | ICD-10-CM | POA: Diagnosis not present

## 2023-08-11 DIAGNOSIS — F411 Generalized anxiety disorder: Secondary | ICD-10-CM | POA: Diagnosis not present

## 2023-08-11 DIAGNOSIS — I7121 Aneurysm of the ascending aorta, without rupture: Secondary | ICD-10-CM | POA: Diagnosis not present

## 2023-08-11 DIAGNOSIS — I5032 Chronic diastolic (congestive) heart failure: Secondary | ICD-10-CM | POA: Diagnosis not present

## 2023-08-11 DIAGNOSIS — I110 Hypertensive heart disease with heart failure: Secondary | ICD-10-CM | POA: Diagnosis not present

## 2023-08-11 DIAGNOSIS — E119 Type 2 diabetes mellitus without complications: Secondary | ICD-10-CM | POA: Diagnosis not present

## 2023-08-14 DIAGNOSIS — I5032 Chronic diastolic (congestive) heart failure: Secondary | ICD-10-CM | POA: Diagnosis not present

## 2023-08-14 DIAGNOSIS — I480 Paroxysmal atrial fibrillation: Secondary | ICD-10-CM | POA: Diagnosis not present

## 2023-08-14 DIAGNOSIS — I2510 Atherosclerotic heart disease of native coronary artery without angina pectoris: Secondary | ICD-10-CM | POA: Diagnosis not present

## 2023-08-14 DIAGNOSIS — N1831 Chronic kidney disease, stage 3a: Secondary | ICD-10-CM | POA: Diagnosis not present

## 2023-08-14 DIAGNOSIS — E119 Type 2 diabetes mellitus without complications: Secondary | ICD-10-CM | POA: Diagnosis not present

## 2023-08-14 DIAGNOSIS — F411 Generalized anxiety disorder: Secondary | ICD-10-CM | POA: Diagnosis not present

## 2023-08-14 DIAGNOSIS — I130 Hypertensive heart and chronic kidney disease with heart failure and stage 1 through stage 4 chronic kidney disease, or unspecified chronic kidney disease: Secondary | ICD-10-CM | POA: Diagnosis not present

## 2023-08-14 DIAGNOSIS — W19XXXA Unspecified fall, initial encounter: Secondary | ICD-10-CM | POA: Diagnosis not present

## 2023-08-14 DIAGNOSIS — I110 Hypertensive heart disease with heart failure: Secondary | ICD-10-CM | POA: Diagnosis not present

## 2023-08-14 DIAGNOSIS — I7121 Aneurysm of the ascending aorta, without rupture: Secondary | ICD-10-CM | POA: Diagnosis not present

## 2023-08-14 DIAGNOSIS — I671 Cerebral aneurysm, nonruptured: Secondary | ICD-10-CM | POA: Diagnosis not present

## 2023-08-14 DIAGNOSIS — I340 Nonrheumatic mitral (valve) insufficiency: Secondary | ICD-10-CM | POA: Diagnosis not present

## 2023-08-15 ENCOUNTER — Telehealth: Payer: Self-pay | Admitting: Cardiology

## 2023-08-15 DIAGNOSIS — I7121 Aneurysm of the ascending aorta, without rupture: Secondary | ICD-10-CM | POA: Diagnosis not present

## 2023-08-15 DIAGNOSIS — F411 Generalized anxiety disorder: Secondary | ICD-10-CM | POA: Diagnosis not present

## 2023-08-15 DIAGNOSIS — I110 Hypertensive heart disease with heart failure: Secondary | ICD-10-CM | POA: Diagnosis not present

## 2023-08-15 DIAGNOSIS — E119 Type 2 diabetes mellitus without complications: Secondary | ICD-10-CM | POA: Diagnosis not present

## 2023-08-15 DIAGNOSIS — I480 Paroxysmal atrial fibrillation: Secondary | ICD-10-CM | POA: Diagnosis not present

## 2023-08-15 DIAGNOSIS — I5032 Chronic diastolic (congestive) heart failure: Secondary | ICD-10-CM | POA: Diagnosis not present

## 2023-08-15 DIAGNOSIS — I2510 Atherosclerotic heart disease of native coronary artery without angina pectoris: Secondary | ICD-10-CM | POA: Diagnosis not present

## 2023-08-15 DIAGNOSIS — I671 Cerebral aneurysm, nonruptured: Secondary | ICD-10-CM | POA: Diagnosis not present

## 2023-08-15 DIAGNOSIS — I340 Nonrheumatic mitral (valve) insufficiency: Secondary | ICD-10-CM | POA: Diagnosis not present

## 2023-08-15 NOTE — Telephone Encounter (Signed)
 Spoke with Joen at [facility] and she stated that the pt's weight when she got back from rehab on 7/3 was 151.4 lbs and as of today (7/15) she is 165 lbs. She is currently taking Lasix  20 mg once daily and Spironolactone  12.5 mg once daily. Pt has been experiencing some DOE per facility. Explained that I will forward this to Dr. Shlomo to review and as soon as we get recommendations, we will call to let them know. Joen requested that an e-script be sent for an rx changes to [facility] d/t to their fax machine being down.

## 2023-08-15 NOTE — Telephone Encounter (Signed)
 Spoke with Joen from [facility] on the phone and explained Dr. Dorine recommendations of the following:  Increase Lasix  to 40mg  daily x 3 days and then go back to 20mg  daily. She needs to weigh herself daily and if wt increases > 3lbs in a day or 5lbs in a week she needs to take an extra lasix  20mg . If weight does not go back down to baseline she needs to call the office. BMET in 1 week    Due to facility's fax machine being down (and an issue on our computer end with e-sending the rx), Joen stated she will take the verbal order over the phone today and when she is in [place] tomorrow (7/16), she will fax our office the order to be signed by Dr. Shlomo.

## 2023-08-15 NOTE — Telephone Encounter (Signed)
 Pt c/o swelling/edema: STAT if pt has developed SOB within 24 hours  If swelling, where is the swelling located? legs  How much weight have you gained and in what time span? 5lbs in a week   Have you gained 2 pounds in a day or 5 pounds in a week? Yes  Do you have a log of your daily weights (if so, list)? Yes   Are you currently taking a fluid pill? Yes  Are you currently SOB? No   Have you traveled recently in a car or plane for an extended period of time?

## 2023-08-17 DIAGNOSIS — L89312 Pressure ulcer of right buttock, stage 2: Secondary | ICD-10-CM | POA: Diagnosis not present

## 2023-08-17 DIAGNOSIS — E1169 Type 2 diabetes mellitus with other specified complication: Secondary | ICD-10-CM | POA: Diagnosis not present

## 2023-08-17 DIAGNOSIS — I480 Paroxysmal atrial fibrillation: Secondary | ICD-10-CM | POA: Diagnosis not present

## 2023-08-17 DIAGNOSIS — I5033 Acute on chronic diastolic (congestive) heart failure: Secondary | ICD-10-CM | POA: Diagnosis not present

## 2023-08-17 DIAGNOSIS — N1831 Chronic kidney disease, stage 3a: Secondary | ICD-10-CM | POA: Diagnosis not present

## 2023-08-17 DIAGNOSIS — E1122 Type 2 diabetes mellitus with diabetic chronic kidney disease: Secondary | ICD-10-CM | POA: Diagnosis not present

## 2023-08-17 DIAGNOSIS — I110 Hypertensive heart disease with heart failure: Secondary | ICD-10-CM | POA: Diagnosis not present

## 2023-08-17 DIAGNOSIS — I7121 Aneurysm of the ascending aorta, without rupture: Secondary | ICD-10-CM | POA: Diagnosis not present

## 2023-08-17 DIAGNOSIS — E785 Hyperlipidemia, unspecified: Secondary | ICD-10-CM | POA: Diagnosis not present

## 2023-08-18 DIAGNOSIS — I7121 Aneurysm of the ascending aorta, without rupture: Secondary | ICD-10-CM | POA: Diagnosis not present

## 2023-08-18 DIAGNOSIS — E1169 Type 2 diabetes mellitus with other specified complication: Secondary | ICD-10-CM | POA: Diagnosis not present

## 2023-08-18 DIAGNOSIS — L89312 Pressure ulcer of right buttock, stage 2: Secondary | ICD-10-CM | POA: Diagnosis not present

## 2023-08-18 DIAGNOSIS — N1831 Chronic kidney disease, stage 3a: Secondary | ICD-10-CM | POA: Diagnosis not present

## 2023-08-18 DIAGNOSIS — E1122 Type 2 diabetes mellitus with diabetic chronic kidney disease: Secondary | ICD-10-CM | POA: Diagnosis not present

## 2023-08-18 DIAGNOSIS — I5033 Acute on chronic diastolic (congestive) heart failure: Secondary | ICD-10-CM | POA: Diagnosis not present

## 2023-08-18 DIAGNOSIS — I110 Hypertensive heart disease with heart failure: Secondary | ICD-10-CM | POA: Diagnosis not present

## 2023-08-18 DIAGNOSIS — I480 Paroxysmal atrial fibrillation: Secondary | ICD-10-CM | POA: Diagnosis not present

## 2023-08-18 DIAGNOSIS — E785 Hyperlipidemia, unspecified: Secondary | ICD-10-CM | POA: Diagnosis not present

## 2023-08-21 DIAGNOSIS — I7121 Aneurysm of the ascending aorta, without rupture: Secondary | ICD-10-CM | POA: Diagnosis not present

## 2023-08-21 DIAGNOSIS — I110 Hypertensive heart disease with heart failure: Secondary | ICD-10-CM | POA: Diagnosis not present

## 2023-08-21 DIAGNOSIS — E1169 Type 2 diabetes mellitus with other specified complication: Secondary | ICD-10-CM | POA: Diagnosis not present

## 2023-08-21 DIAGNOSIS — F4321 Adjustment disorder with depressed mood: Secondary | ICD-10-CM | POA: Diagnosis not present

## 2023-08-21 DIAGNOSIS — E1122 Type 2 diabetes mellitus with diabetic chronic kidney disease: Secondary | ICD-10-CM | POA: Diagnosis not present

## 2023-08-21 DIAGNOSIS — E785 Hyperlipidemia, unspecified: Secondary | ICD-10-CM | POA: Diagnosis not present

## 2023-08-21 DIAGNOSIS — I5033 Acute on chronic diastolic (congestive) heart failure: Secondary | ICD-10-CM | POA: Diagnosis not present

## 2023-08-21 DIAGNOSIS — I480 Paroxysmal atrial fibrillation: Secondary | ICD-10-CM | POA: Diagnosis not present

## 2023-08-21 DIAGNOSIS — N1831 Chronic kidney disease, stage 3a: Secondary | ICD-10-CM | POA: Diagnosis not present

## 2023-08-21 DIAGNOSIS — L89312 Pressure ulcer of right buttock, stage 2: Secondary | ICD-10-CM | POA: Diagnosis not present

## 2023-08-23 DIAGNOSIS — L89312 Pressure ulcer of right buttock, stage 2: Secondary | ICD-10-CM | POA: Diagnosis not present

## 2023-08-23 DIAGNOSIS — I7121 Aneurysm of the ascending aorta, without rupture: Secondary | ICD-10-CM | POA: Diagnosis not present

## 2023-08-23 DIAGNOSIS — I5033 Acute on chronic diastolic (congestive) heart failure: Secondary | ICD-10-CM | POA: Diagnosis not present

## 2023-08-23 DIAGNOSIS — E1122 Type 2 diabetes mellitus with diabetic chronic kidney disease: Secondary | ICD-10-CM | POA: Diagnosis not present

## 2023-08-23 DIAGNOSIS — N1831 Chronic kidney disease, stage 3a: Secondary | ICD-10-CM | POA: Diagnosis not present

## 2023-08-23 DIAGNOSIS — I480 Paroxysmal atrial fibrillation: Secondary | ICD-10-CM | POA: Diagnosis not present

## 2023-08-23 DIAGNOSIS — E785 Hyperlipidemia, unspecified: Secondary | ICD-10-CM | POA: Diagnosis not present

## 2023-08-23 DIAGNOSIS — I110 Hypertensive heart disease with heart failure: Secondary | ICD-10-CM | POA: Diagnosis not present

## 2023-08-23 DIAGNOSIS — E1169 Type 2 diabetes mellitus with other specified complication: Secondary | ICD-10-CM | POA: Diagnosis not present

## 2023-08-24 DIAGNOSIS — I7121 Aneurysm of the ascending aorta, without rupture: Secondary | ICD-10-CM | POA: Diagnosis not present

## 2023-08-24 DIAGNOSIS — I480 Paroxysmal atrial fibrillation: Secondary | ICD-10-CM | POA: Diagnosis not present

## 2023-08-24 DIAGNOSIS — N1831 Chronic kidney disease, stage 3a: Secondary | ICD-10-CM | POA: Diagnosis not present

## 2023-08-24 DIAGNOSIS — G4700 Insomnia, unspecified: Secondary | ICD-10-CM | POA: Diagnosis not present

## 2023-08-24 DIAGNOSIS — E1122 Type 2 diabetes mellitus with diabetic chronic kidney disease: Secondary | ICD-10-CM | POA: Diagnosis not present

## 2023-08-24 DIAGNOSIS — I5033 Acute on chronic diastolic (congestive) heart failure: Secondary | ICD-10-CM | POA: Diagnosis not present

## 2023-08-24 DIAGNOSIS — E785 Hyperlipidemia, unspecified: Secondary | ICD-10-CM | POA: Diagnosis not present

## 2023-08-24 DIAGNOSIS — I509 Heart failure, unspecified: Secondary | ICD-10-CM | POA: Diagnosis not present

## 2023-08-24 DIAGNOSIS — F4321 Adjustment disorder with depressed mood: Secondary | ICD-10-CM | POA: Diagnosis not present

## 2023-08-24 DIAGNOSIS — I110 Hypertensive heart disease with heart failure: Secondary | ICD-10-CM | POA: Diagnosis not present

## 2023-08-24 DIAGNOSIS — L89312 Pressure ulcer of right buttock, stage 2: Secondary | ICD-10-CM | POA: Diagnosis not present

## 2023-08-24 DIAGNOSIS — E1169 Type 2 diabetes mellitus with other specified complication: Secondary | ICD-10-CM | POA: Diagnosis not present

## 2023-08-24 DIAGNOSIS — F419 Anxiety disorder, unspecified: Secondary | ICD-10-CM | POA: Diagnosis not present

## 2023-08-25 DIAGNOSIS — I7121 Aneurysm of the ascending aorta, without rupture: Secondary | ICD-10-CM | POA: Diagnosis not present

## 2023-08-25 DIAGNOSIS — I110 Hypertensive heart disease with heart failure: Secondary | ICD-10-CM | POA: Diagnosis not present

## 2023-08-25 DIAGNOSIS — I480 Paroxysmal atrial fibrillation: Secondary | ICD-10-CM | POA: Diagnosis not present

## 2023-08-25 DIAGNOSIS — I5033 Acute on chronic diastolic (congestive) heart failure: Secondary | ICD-10-CM | POA: Diagnosis not present

## 2023-08-25 DIAGNOSIS — E785 Hyperlipidemia, unspecified: Secondary | ICD-10-CM | POA: Diagnosis not present

## 2023-08-25 DIAGNOSIS — E1122 Type 2 diabetes mellitus with diabetic chronic kidney disease: Secondary | ICD-10-CM | POA: Diagnosis not present

## 2023-08-25 DIAGNOSIS — L89312 Pressure ulcer of right buttock, stage 2: Secondary | ICD-10-CM | POA: Diagnosis not present

## 2023-08-25 DIAGNOSIS — N1831 Chronic kidney disease, stage 3a: Secondary | ICD-10-CM | POA: Diagnosis not present

## 2023-08-25 DIAGNOSIS — E1169 Type 2 diabetes mellitus with other specified complication: Secondary | ICD-10-CM | POA: Diagnosis not present

## 2023-08-28 DIAGNOSIS — I7121 Aneurysm of the ascending aorta, without rupture: Secondary | ICD-10-CM | POA: Diagnosis not present

## 2023-08-28 DIAGNOSIS — E1122 Type 2 diabetes mellitus with diabetic chronic kidney disease: Secondary | ICD-10-CM | POA: Diagnosis not present

## 2023-08-28 DIAGNOSIS — H9190 Unspecified hearing loss, unspecified ear: Secondary | ICD-10-CM | POA: Diagnosis not present

## 2023-08-28 DIAGNOSIS — R609 Edema, unspecified: Secondary | ICD-10-CM | POA: Diagnosis not present

## 2023-08-28 DIAGNOSIS — I480 Paroxysmal atrial fibrillation: Secondary | ICD-10-CM | POA: Diagnosis not present

## 2023-08-28 DIAGNOSIS — I5032 Chronic diastolic (congestive) heart failure: Secondary | ICD-10-CM | POA: Diagnosis not present

## 2023-08-28 DIAGNOSIS — E785 Hyperlipidemia, unspecified: Secondary | ICD-10-CM | POA: Diagnosis not present

## 2023-08-28 DIAGNOSIS — R0601 Orthopnea: Secondary | ICD-10-CM | POA: Diagnosis not present

## 2023-08-28 DIAGNOSIS — I5033 Acute on chronic diastolic (congestive) heart failure: Secondary | ICD-10-CM | POA: Diagnosis not present

## 2023-08-28 DIAGNOSIS — E1169 Type 2 diabetes mellitus with other specified complication: Secondary | ICD-10-CM | POA: Diagnosis not present

## 2023-08-28 DIAGNOSIS — I110 Hypertensive heart disease with heart failure: Secondary | ICD-10-CM | POA: Diagnosis not present

## 2023-08-28 DIAGNOSIS — L89312 Pressure ulcer of right buttock, stage 2: Secondary | ICD-10-CM | POA: Diagnosis not present

## 2023-08-28 DIAGNOSIS — N1831 Chronic kidney disease, stage 3a: Secondary | ICD-10-CM | POA: Diagnosis not present

## 2023-08-28 DIAGNOSIS — S31829D Unspecified open wound of left buttock, subsequent encounter: Secondary | ICD-10-CM | POA: Diagnosis not present

## 2023-08-30 DIAGNOSIS — N1831 Chronic kidney disease, stage 3a: Secondary | ICD-10-CM | POA: Diagnosis not present

## 2023-08-30 DIAGNOSIS — L89312 Pressure ulcer of right buttock, stage 2: Secondary | ICD-10-CM | POA: Diagnosis not present

## 2023-08-30 DIAGNOSIS — I7121 Aneurysm of the ascending aorta, without rupture: Secondary | ICD-10-CM | POA: Diagnosis not present

## 2023-08-30 DIAGNOSIS — E1122 Type 2 diabetes mellitus with diabetic chronic kidney disease: Secondary | ICD-10-CM | POA: Diagnosis not present

## 2023-08-30 DIAGNOSIS — I110 Hypertensive heart disease with heart failure: Secondary | ICD-10-CM | POA: Diagnosis not present

## 2023-08-30 DIAGNOSIS — E785 Hyperlipidemia, unspecified: Secondary | ICD-10-CM | POA: Diagnosis not present

## 2023-08-30 DIAGNOSIS — I480 Paroxysmal atrial fibrillation: Secondary | ICD-10-CM | POA: Diagnosis not present

## 2023-08-30 DIAGNOSIS — E1169 Type 2 diabetes mellitus with other specified complication: Secondary | ICD-10-CM | POA: Diagnosis not present

## 2023-08-30 DIAGNOSIS — I5033 Acute on chronic diastolic (congestive) heart failure: Secondary | ICD-10-CM | POA: Diagnosis not present

## 2023-08-31 ENCOUNTER — Encounter (INDEPENDENT_AMBULATORY_CARE_PROVIDER_SITE_OTHER): Payer: Self-pay

## 2023-08-31 DIAGNOSIS — I5033 Acute on chronic diastolic (congestive) heart failure: Secondary | ICD-10-CM | POA: Diagnosis not present

## 2023-08-31 DIAGNOSIS — E1122 Type 2 diabetes mellitus with diabetic chronic kidney disease: Secondary | ICD-10-CM | POA: Diagnosis not present

## 2023-08-31 DIAGNOSIS — I110 Hypertensive heart disease with heart failure: Secondary | ICD-10-CM | POA: Diagnosis not present

## 2023-08-31 DIAGNOSIS — L89312 Pressure ulcer of right buttock, stage 2: Secondary | ICD-10-CM | POA: Diagnosis not present

## 2023-08-31 DIAGNOSIS — I7121 Aneurysm of the ascending aorta, without rupture: Secondary | ICD-10-CM | POA: Diagnosis not present

## 2023-08-31 DIAGNOSIS — I10 Essential (primary) hypertension: Secondary | ICD-10-CM | POA: Diagnosis not present

## 2023-08-31 DIAGNOSIS — E1169 Type 2 diabetes mellitus with other specified complication: Secondary | ICD-10-CM | POA: Diagnosis not present

## 2023-08-31 DIAGNOSIS — N1831 Chronic kidney disease, stage 3a: Secondary | ICD-10-CM | POA: Diagnosis not present

## 2023-08-31 DIAGNOSIS — E785 Hyperlipidemia, unspecified: Secondary | ICD-10-CM | POA: Diagnosis not present

## 2023-08-31 DIAGNOSIS — E119 Type 2 diabetes mellitus without complications: Secondary | ICD-10-CM | POA: Diagnosis not present

## 2023-08-31 DIAGNOSIS — I480 Paroxysmal atrial fibrillation: Secondary | ICD-10-CM | POA: Diagnosis not present

## 2023-09-01 ENCOUNTER — Telehealth: Payer: Self-pay | Admitting: Cardiology

## 2023-09-01 NOTE — Telephone Encounter (Signed)
 Pt is returning call to nurse.

## 2023-09-01 NOTE — Telephone Encounter (Signed)
 Pt c/o swelling/edema: STAT if pt has developed SOB within 24 hours  If swelling, where is the swelling located?  Mostly in legs  How much weight have you gained and in what time span?   Yes  Have you gained 2 pounds in a day or 5 pounds in a week?   5 pounds overnight  Do you have a log of your daily weights (if so, list)?   Yes.  Are you currently taking a fluid pill?   Yes  Are you currently SOB?   Yes  Have you traveled recently in a car or plane for an extended period of time? No  Caller (Sherri) stated she will be faxing patient's latest BMP and weight log for review.  Caller stated patient says she is unable to lay down due to fluid retention.  Caller wants a call back to discuss increasing Lasix  to 40 mg.

## 2023-09-01 NOTE — Telephone Encounter (Signed)
 Left message for Joen to callback.  Spoke with DOD Dr. Pietro, reviewed symptoms and recent labs with him. OK to increase Lasix  to 40 mg daily and repeat BMET in 1 week.  MyChart message sent to patient with recommendations as well.

## 2023-09-01 NOTE — Telephone Encounter (Signed)
 Joen, RN Care Manager with [facility], called to report patient is having swelling to her legs, weight gain, and SOB when lying down. Therapy has not reported SOB when patient is ambulating.  Patient's weight went from 158.1 lbs to 168 lbs in 1 week (9.9 lb weight gain).  Patient's PCP saw her on 08/28/23 and increased Lasix  to 40 mg x2 days. Patient is now back to taking Lasix  20 mg daily, along with spironolactone  12.5 mg daily  Patient's weight before and after taking increased dose of Lasix  x2 days: 7/28 -- 160.2 lbs 7/29 -- 163.4 lbs 7/30 -- 167 lbs 7/31 -- 163.2 lbs 8/1 -- 168 lbs  Joen is concerned stating the last time her legs swelled patient had to go to the hospital to have 20 lbs of fluid pulled off. She would like to know if patient can increase Lasix  to 40 mg daily.  BMP drawn on 08/24/23 shows: Sodium 143 Potassium 4.7 Chloride 104 CO2 29  Urea (BUN) 22 Creatinine 1.10 BUN/CREAT Ratio 20 Calcium 9.0 Anion Gap 10 eGFR 48.1  Will forward to Dr. Shlomo to review and advise on increasing Lasix  to 40 mg daily.

## 2023-09-01 NOTE — Telephone Encounter (Signed)
 Spoke with [name] and notified her of DOD's suggestions. [name] verbalized understanding. All questions if any were answered. [name] stated they have someone to draw labs and will send over and order to be signed.

## 2023-09-04 DIAGNOSIS — I7121 Aneurysm of the ascending aorta, without rupture: Secondary | ICD-10-CM | POA: Diagnosis not present

## 2023-09-04 DIAGNOSIS — N1831 Chronic kidney disease, stage 3a: Secondary | ICD-10-CM | POA: Diagnosis not present

## 2023-09-04 DIAGNOSIS — E1122 Type 2 diabetes mellitus with diabetic chronic kidney disease: Secondary | ICD-10-CM | POA: Diagnosis not present

## 2023-09-04 DIAGNOSIS — I110 Hypertensive heart disease with heart failure: Secondary | ICD-10-CM | POA: Diagnosis not present

## 2023-09-04 DIAGNOSIS — E785 Hyperlipidemia, unspecified: Secondary | ICD-10-CM | POA: Diagnosis not present

## 2023-09-04 DIAGNOSIS — L89312 Pressure ulcer of right buttock, stage 2: Secondary | ICD-10-CM | POA: Diagnosis not present

## 2023-09-04 DIAGNOSIS — I480 Paroxysmal atrial fibrillation: Secondary | ICD-10-CM | POA: Diagnosis not present

## 2023-09-04 DIAGNOSIS — F4321 Adjustment disorder with depressed mood: Secondary | ICD-10-CM | POA: Diagnosis not present

## 2023-09-04 DIAGNOSIS — I5033 Acute on chronic diastolic (congestive) heart failure: Secondary | ICD-10-CM | POA: Diagnosis not present

## 2023-09-04 DIAGNOSIS — E1169 Type 2 diabetes mellitus with other specified complication: Secondary | ICD-10-CM | POA: Diagnosis not present

## 2023-09-04 NOTE — Telephone Encounter (Signed)
Attempted phone call to pt and left voicemail message to contact office at [phone]. 

## 2023-09-05 DIAGNOSIS — F411 Generalized anxiety disorder: Secondary | ICD-10-CM | POA: Diagnosis not present

## 2023-09-05 DIAGNOSIS — F4321 Adjustment disorder with depressed mood: Secondary | ICD-10-CM | POA: Diagnosis not present

## 2023-09-06 ENCOUNTER — Other Ambulatory Visit: Payer: Self-pay

## 2023-09-06 ENCOUNTER — Emergency Department (HOSPITAL_COMMUNITY)

## 2023-09-06 ENCOUNTER — Emergency Department (HOSPITAL_COMMUNITY)
Admission: EM | Admit: 2023-09-06 | Discharge: 2023-09-06 | Disposition: A | Discharge status: Home / Self Care | Source: Skilled Nursing Facility | Attending: Emergency Medicine | Admitting: Emergency Medicine

## 2023-09-06 DIAGNOSIS — R262 Difficulty in walking, not elsewhere classified: Secondary | ICD-10-CM | POA: Diagnosis not present

## 2023-09-06 DIAGNOSIS — R600 Localized edema: Secondary | ICD-10-CM | POA: Diagnosis not present

## 2023-09-06 DIAGNOSIS — E785 Hyperlipidemia, unspecified: Secondary | ICD-10-CM | POA: Diagnosis not present

## 2023-09-06 DIAGNOSIS — E119 Type 2 diabetes mellitus without complications: Secondary | ICD-10-CM | POA: Insufficient documentation

## 2023-09-06 DIAGNOSIS — E1122 Type 2 diabetes mellitus with diabetic chronic kidney disease: Secondary | ICD-10-CM | POA: Diagnosis not present

## 2023-09-06 DIAGNOSIS — Z7901 Long term (current) use of anticoagulants: Secondary | ICD-10-CM | POA: Diagnosis not present

## 2023-09-06 DIAGNOSIS — R001 Bradycardia, unspecified: Secondary | ICD-10-CM | POA: Diagnosis not present

## 2023-09-06 DIAGNOSIS — I2510 Atherosclerotic heart disease of native coronary artery without angina pectoris: Secondary | ICD-10-CM | POA: Insufficient documentation

## 2023-09-06 DIAGNOSIS — Z794 Long term (current) use of insulin: Secondary | ICD-10-CM | POA: Insufficient documentation

## 2023-09-06 DIAGNOSIS — N1831 Chronic kidney disease, stage 3a: Secondary | ICD-10-CM | POA: Diagnosis not present

## 2023-09-06 DIAGNOSIS — I7121 Aneurysm of the ascending aorta, without rupture: Secondary | ICD-10-CM | POA: Diagnosis not present

## 2023-09-06 DIAGNOSIS — I480 Paroxysmal atrial fibrillation: Secondary | ICD-10-CM | POA: Diagnosis not present

## 2023-09-06 DIAGNOSIS — Z7401 Bed confinement status: Secondary | ICD-10-CM | POA: Diagnosis not present

## 2023-09-06 DIAGNOSIS — R531 Weakness: Secondary | ICD-10-CM | POA: Diagnosis not present

## 2023-09-06 DIAGNOSIS — R0989 Other specified symptoms and signs involving the circulatory and respiratory systems: Secondary | ICD-10-CM | POA: Diagnosis not present

## 2023-09-06 DIAGNOSIS — I110 Hypertensive heart disease with heart failure: Secondary | ICD-10-CM | POA: Diagnosis not present

## 2023-09-06 DIAGNOSIS — L89312 Pressure ulcer of right buttock, stage 2: Secondary | ICD-10-CM | POA: Diagnosis not present

## 2023-09-06 DIAGNOSIS — E1169 Type 2 diabetes mellitus with other specified complication: Secondary | ICD-10-CM | POA: Diagnosis not present

## 2023-09-06 DIAGNOSIS — R609 Edema, unspecified: Secondary | ICD-10-CM | POA: Diagnosis not present

## 2023-09-06 DIAGNOSIS — I5033 Acute on chronic diastolic (congestive) heart failure: Secondary | ICD-10-CM | POA: Diagnosis not present

## 2023-09-06 DIAGNOSIS — Z85038 Personal history of other malignant neoplasm of large intestine: Secondary | ICD-10-CM | POA: Insufficient documentation

## 2023-09-06 DIAGNOSIS — M7989 Other specified soft tissue disorders: Secondary | ICD-10-CM | POA: Diagnosis present

## 2023-09-06 DIAGNOSIS — J811 Chronic pulmonary edema: Secondary | ICD-10-CM | POA: Diagnosis not present

## 2023-09-06 LAB — CBC WITH DIFFERENTIAL/PLATELET
Abs Immature Granulocytes: 0.04 K/uL (ref 0.00–0.07)
Basophils Absolute: 0.1 K/uL (ref 0.0–0.1)
Basophils Relative: 1 %
Eosinophils Absolute: 0.1 K/uL (ref 0.0–0.5)
Eosinophils Relative: 1 %
HCT: 38.1 % (ref 36.0–46.0)
Hemoglobin: 11.5 g/dL — ABNORMAL LOW (ref 12.0–15.0)
Immature Granulocytes: 0 %
Lymphocytes Relative: 42 %
Lymphs Abs: 4.3 K/uL — ABNORMAL HIGH (ref 0.7–4.0)
MCH: 23.7 pg — ABNORMAL LOW (ref 26.0–34.0)
MCHC: 30.2 g/dL (ref 30.0–36.0)
MCV: 78.4 fL — ABNORMAL LOW (ref 80.0–100.0)
Monocytes Absolute: 0.7 K/uL (ref 0.1–1.0)
Monocytes Relative: 7 %
Neutro Abs: 5 K/uL (ref 1.7–7.7)
Neutrophils Relative %: 49 %
Platelets: 301 K/uL (ref 150–400)
RBC: 4.86 MIL/uL (ref 3.87–5.11)
RDW: 16 % — ABNORMAL HIGH (ref 11.5–15.5)
WBC: 10.2 K/uL (ref 4.0–10.5)
nRBC: 0 % (ref 0.0–0.2)

## 2023-09-06 LAB — BASIC METABOLIC PANEL WITH GFR
Anion gap: 10 (ref 5–15)
BUN: 30 mg/dL — ABNORMAL HIGH (ref 8–23)
CO2: 31 mmol/L (ref 22–32)
Calcium: 9.7 mg/dL (ref 8.9–10.3)
Chloride: 97 mmol/L — ABNORMAL LOW (ref 98–111)
Creatinine, Ser: 1.18 mg/dL — ABNORMAL HIGH (ref 0.44–1.00)
GFR, Estimated: 44 mL/min — ABNORMAL LOW (ref >60)
Glucose, Bld: 133 mg/dL — ABNORMAL HIGH (ref 70–99)
Potassium: 4.4 mmol/L (ref 3.5–5.1)
Sodium: 138 mmol/L (ref 135–145)

## 2023-09-06 LAB — BRAIN NATRIURETIC PEPTIDE: B Natriuretic Peptide: 252.6 pg/mL — ABNORMAL HIGH (ref 0.0–100.0)

## 2023-09-06 LAB — CBG MONITORING, ED: Glucose-Capillary: 131 mg/dL — ABNORMAL HIGH (ref 70–99)

## 2023-09-06 MED ORDER — FUROSEMIDE 10 MG/ML IJ SOLN
40.0000 mg | Freq: Once | INTRAMUSCULAR | Status: AC
  Administered 2023-09-06: 40 mg via INTRAVENOUS
  Filled 2023-09-06: qty 4

## 2023-09-06 NOTE — ED Notes (Signed)
 NT assisted pt to the restroom, pt held NT's hand while walking to restroom

## 2023-09-06 NOTE — Discharge Instructions (Signed)
 Please continue taking Lasix  as prescribed.  I would like for you to follow-up with your primary care doctor for further evaluation.  You may return to the emergency department for any worsening symptoms.

## 2023-09-06 NOTE — Telephone Encounter (Signed)
 Attempted to call Joen Bright RN Case Manager at [facility]. Spoke with Rowena. Advised we have been calling trying to follow up on Sherry's reports that patient's leg swelling/SOB do not seem to be improving with increased lasix , Dr. Shlomo advising patient needs to go to ED for IV lasix . Rowena verbalizes understanding and agrees to discuss with Joen and with patient/family members.

## 2023-09-06 NOTE — ED Notes (Signed)
 Unable to give report to facility called x3 times. Final call answered by med tech who informed RN there were no nurse on staff at the moment

## 2023-09-06 NOTE — ED Provider Notes (Signed)
 [facility] AT [facility] Provider Note   CSN: [id number] Arrival date & time: 09/06/23  1238     Patient presents with: No chief complaint on file.   [name] is a 89 y.o. female.   The history is provided by the patient, the EMS personnel and medical records. No language interpreter was used.     89 year old female history of anxiety, diabetes, brain aneurysm, colon cancer, CAD, A-fib currently on Eliquis  brought here via EMS from her facility with concerns of leg swelling.  Patient states she stays at a facility and that they weigh her on a daily basis.  For the past month they have noticed increasing fluid gain which I thought could be due to her edema not adequately controlled with her home Lasix  and patient was sent in here for further evaluation and management which includes IV Lasix .  Patient however states she overall feels fine.  She denies any significant shortness of breath only with exertion.  She felt her fluid retention is about the same.  She denies any significant leg pain no fever no chills and no change in her dietary intake or change in medication.  Prior to Admission medications   Medication Sig Start Date End Date Taking? Authorizing Provider  acetaminophen  (TYLENOL ) 500 MG tablet Take 500 mg by mouth every 6 (six) hours as needed for mild pain, moderate pain, fever or headache.    [provider]  aluminum -magnesium  hydroxide 200-200 MG/5ML suspension Take 15 mLs by mouth as needed for indigestion. 06/23/23   Leotis Bogus, MD  amiodarone  (PACERONE ) 200 MG tablet Take 1 tablet (200 mg total) by mouth daily. 07/04/23   Parthenia Olivia HERO, PA-C  apixaban  (ELIQUIS ) 5 MG TABS tablet Take 1 tablet (5 mg total) by mouth 2 (two) times daily. 12/05/22   Shlomo Wilbert SAUNDERS, MD  busPIRone  (BUSPAR ) 5 MG tablet Take 5 mg by mouth 2 (two) times daily.    [provider]  diclofenac  Sodium (VOLTAREN ) 1 % GEL Apply 2 g topically 4  (four) times daily as needed. Bilateral shoulders 07/18/23   Arrien, Elidia Sieving, MD  docusate sodium  (COLACE) 100 MG capsule Take 100 mg by mouth daily as needed for moderate constipation.    [provider]  furosemide  (LASIX ) 20 MG tablet Take 1 tablet (20 mg total) by mouth daily. 07/19/23   Arrien, Elidia Sieving, MD  hydrALAZINE  (APRESOLINE ) 10 MG tablet Take 1 tablet (10 mg total) by mouth 3 (three) times daily. 07/04/23   Parthenia Olivia HERO, PA-C  insulin  glargine (LANTUS ) 100 UNIT/ML Solostar Pen Inject 18 Units into the skin in the morning. 05/20/23   Danford, Lonni SQUIBB, MD  LORazepam  (ATIVAN ) 0.5 MG tablet Take 1 tablet (0.5 mg total) by mouth every 12 (twelve) hours as needed for anxiety. 07/19/23   Fairy Frames, MD  losartan  (COZAAR ) 25 MG tablet Take 1 tablet (25 mg total) by mouth daily. 07/19/23 08/18/23  Arrien, Mauricio Daniel, MD  melatonin 3 MG TABS tablet Take 3 mg by mouth at bedtime.    [provider]  metoprolol  tartrate (LOPRESSOR ) 25 MG tablet Take 1 tablet (25 mg total) by mouth 2 (two) times daily. 05/20/23   Danford, Lonni SQUIBB, MD  Multiple Vitamin (MULTIVITAMIN) tablet Take 1 tablet by mouth in the morning.    [provider]  ondansetron  (ZOFRAN ) 4 MG tablet Take 1 tablet (4 mg total) by mouth every 8 (eight) hours as needed for nausea or  vomiting. 05/20/23   Danford, Lonni SQUIBB, MD  pantoprazole  (PROTONIX ) 40 MG tablet TAKE 1 TABLET BY MOUTH EVERY DAY BEFORE BREAKFAST Patient taking differently: Take 40 mg by mouth daily. 02/03/20   Avram Lupita BRAVO, MD  polyethylene glycol (MIRALAX  / GLYCOLAX ) 17 g packet Take 17 g by mouth daily as needed for moderate constipation. 05/20/23   Danford, Lonni SQUIBB, MD  spironolactone  (ALDACTONE ) 25 MG tablet Take 0.5 tablets (12.5 mg total) by mouth daily. 06/05/23   Shlomo Wilbert SAUNDERS, MD    Allergies: Amoxicillin , Fosamax [alendronate sodium], Ms contin  [morphine ], Pioglitazone, Rosiglitazone, Ultram   [tramadol ], Atorvastatin, Glimepiride, Linagliptin, Metformin and related, Repaglinide, Sitagliptin, Zoloft [sertraline hcl], Aspirin , Bentyl [dicyclomine hcl], Cephalexin, Cipro  [ciprofloxacin  hcl], Ciprofloxacin , Dicyclomine, Hctz [hydrochlorothiazide ], Morphine  and codeine, Norvasc  [amlodipine  besylate], Oxycontin [oxycodone], Penicillin g, Sulfa antibiotics, Penicillins, and Sulfamethoxazole-trimethoprim    Review of Systems  All other systems reviewed and are negative.   Updated Vital Signs BP (!) 152/63 (BP Location: Left Arm)   Pulse (!) 58   Temp 98.3 F (36.8 C) (Oral)   Resp 16   SpO2 96%   Physical Exam Vitals and nursing note reviewed.  Constitutional:      General: She is not in acute distress.    Appearance: She is well-developed.     Comments: Elderly female laying in bed appears to be in no acute discomfort.  HENT:     Head: Atraumatic.  Eyes:     Conjunctiva/sclera: Conjunctivae normal.  Neck:     Comments: No JVD Cardiovascular:     Rate and Rhythm: Normal rate and regular rhythm.     Pulses: Normal pulses.     Heart sounds: Normal heart sounds.  Pulmonary:     Effort: Pulmonary effort is normal.     Breath sounds: No wheezing, rhonchi or rales.  Musculoskeletal:     Cervical back: Neck supple.     Right lower leg: No edema.     Left lower leg: Edema present.  Skin:    Findings: No rash.  Neurological:     Mental Status: She is alert. Mental status is at baseline.  Psychiatric:        Mood and Affect: Mood normal.     (all labs ordered are listed, but only abnormal results are displayed) Labs Reviewed  BASIC METABOLIC PANEL WITH GFR  CBC WITH DIFFERENTIAL/PLATELET  BRAIN NATRIURETIC PEPTIDE    EKG: EKG Interpretation Date/Time:  Wednesday September 06 2023 14:05:16 EDT Ventricular Rate:  52 PR Interval:  194 QRS Duration:  86 QT Interval:  368 QTC Calculation: 342 R Axis:   40  Text Interpretation: Sinus bradycardia Nonspecific ST and T  wave abnormality Atrial fibrillation RESOLVED SINCE PREVIOUS When compared with ECG of 16-Jul-2023 06:22, PREVIOUS ECG IS PRESENT Artifact Confirmed by Doretha Folks ([id number]) on 09/06/2023 2:13:18 PM  Radiology: DG Chest 2 View Result Date: 09/06/2023 CLINICAL DATA:  Fluid retention, edema EXAM: CHEST - 2 VIEW COMPARISON:  07/13/2023 FINDINGS: Frontal and lateral views of the chest demonstrate a stable enlarged cardiac silhouette. There is mild chronic vascular congestion without acute airspace disease, effusion, or pneumothorax. No acute bony abnormalities. IMPRESSION: 1. Stable enlarged cardiac silhouette. 2. Mild vascular congestion without overt edema. Electronically Signed   By: Ozell Daring M.D.   On: 09/06/2023 14:32     Procedures   Medications Ordered in the ED - No data to display  Medical Decision Making Amount and/or Complexity of Data Reviewed Labs: ordered. Radiology: ordered.  Risk Prescription drug management.   BP (!) 152/63 (BP Location: Left Arm)   Pulse (!) 58   Temp 98.3 F (36.8 C) (Oral)   Resp 16   SpO2 96%   28:58 PM  89 year old female history of anxiety, diabetes, brain aneurysm, colon cancer, CAD, A-fib currently on Eliquis  brought here via EMS from her facility with concerns of leg swelling.  Patient states she stays at a facility and that they weigh her on a daily basis.  For the past month they have noticed increasing fluid gain which I thought could be due to her edema not adequately controlled with her home Lasix  and patient was sent in here for further evaluation and management which includes IV Lasix .  Patient however states she overall feels fine.  She denies any significant shortness of breath only with exertion.  She felt her fluid retention is about the same.  She denies any significant leg pain no fever no chills and no change in her dietary intake or change in medication.  On exam elderly female laying bed  appears to be in no acute discomfort.  No obvious JVD, lungs with decreased breath sounds but no overt wheeze rales or rhonchi heard.  She does have 3+ pitting to bilateral lower extremities extending towards her thigh.  EMR reviewed patient has been managed by her PCP for her peripheral edema but then note did mention for patient to be evaluated in ER and likely IV Lasix  due to her increased fluid retention.  -Labs ordered, and is currently pending -The patient was maintained on a cardiac monitor.  I personally viewed and interpreted the cardiac monitored which showed an underlying rhythm of: Sinus bradycardia -Imaging independently viewed and interpreted by me and I agree with radiologist's interpretation.  Result remarkable for chest x-ray shows mild vascular congestion without overt edema -This patient presents to the ED for concern of leg swelling, this involves an extensive number of treatment options, and is a complaint that carries with it a high risk of complications and morbidity.  The differential diagnosis includes CHF, DVT, dependent edema, lymphedema, cellulitis -Co morbidities that complicate the patient evaluation includes anxiety, diabetes, CAD, A-fib, regular rhythm -Treatment includes Lasix  -Reevaluation of the patient after these medicines showed that the patient improved -PCP office notes or outside notes reviewed -Discussion with attending Dr. Doretha -Escalation to admission/observation considered: dispo pending  Pt sign out to oncoming provider      Final diagnoses:  Peripheral edema    ED Discharge Orders     None          Nivia Colon, PA-C 09/06/23 1528    Doretha Folks, MD 09/09/23 [phone]

## 2023-09-06 NOTE — ED Notes (Signed)
[facility] called, 2nd in line ?

## 2023-09-06 NOTE — ED Triage Notes (Signed)
 According to [facility]: Ems was called by facility report ly because pt has fluid retention and they stated she knows she has fluid retention, however on arrival ems found pt to not be symptomatic. Pt says she feels fine and has no pain. According to ems pt is able to walk with walker and does not have increased work of breathing.  Vitals: 170/70 56 hr 97% spo2 221 cbg 16 RR

## 2023-09-06 NOTE — ED Provider Notes (Signed)
  Physical Exam  BP (!) 190/76 (BP Location: Left Arm)   Pulse (!) 58   Temp 98.1 F (36.7 C) (Oral)   Resp 16   SpO2 98%   Physical Exam Vitals and nursing note reviewed.  Constitutional:      General: She is not in acute distress.    Appearance: Normal appearance.  HENT:     Head: Normocephalic and atraumatic.  Eyes:     General:        Right eye: No discharge.        Left eye: No discharge.  Cardiovascular:     Comments: Regular rate and rhythm.  S1/S2 are distinct without any evidence of murmur, rubs, or gallops.  Radial pulses are 2+ bilaterally.  Dorsalis pedis pulses are 2+ bilaterally.   Pulmonary:     Comments: Clear to auscultation bilaterally.  Normal effort.  No respiratory distress.  No evidence of wheezes, rales, or rhonchi heard throughout. Abdominal:     General: Abdomen is flat. Bowel sounds are normal. There is no distension.     Tenderness: There is no abdominal tenderness. There is no guarding or rebound.  Musculoskeletal:        General: Normal range of motion.     Cervical back: Neck supple.  Skin:    General: Skin is warm and dry.     Findings: No rash.  Neurological:     General: No focal deficit present.     Mental Status: She is alert.  Psychiatric:        Mood and Affect: Mood normal.        Behavior: Behavior normal.     Procedures  Procedures  ED Course / MDM    Medical Decision Making Amount and/or Complexity of Data Reviewed Labs: ordered. Radiology: ordered.  Risk Prescription drug management.   Accepted handoff at shift change from Tran PA-C. Please see prior provider note for more detail.   Briefly: Patient is 89 y.o. female patient who was sent here for Lasix  secondary to bilateral lower extremity edema.  Patient not having any symptoms at this time.  DDX: concern for heart failure exacerbation  Plan: Discharge home.  BNP is improved from last time.  Patient received Lasix  here.  She is not hypokalemic.  She has mild  hypochloremic but otherwise feeling well.  Will plan to discharge back to facility.  Strict turn precautions were discussed.  Patient is safer discharge at this time.            Theotis Cameron HERO, PA-C 09/06/23 1635    Elnor Jayson LABOR, DO 09/11/23 2138

## 2023-09-07 DIAGNOSIS — E785 Hyperlipidemia, unspecified: Secondary | ICD-10-CM | POA: Diagnosis not present

## 2023-09-07 DIAGNOSIS — I7121 Aneurysm of the ascending aorta, without rupture: Secondary | ICD-10-CM | POA: Diagnosis not present

## 2023-09-07 DIAGNOSIS — E1122 Type 2 diabetes mellitus with diabetic chronic kidney disease: Secondary | ICD-10-CM | POA: Diagnosis not present

## 2023-09-07 DIAGNOSIS — I5033 Acute on chronic diastolic (congestive) heart failure: Secondary | ICD-10-CM | POA: Diagnosis not present

## 2023-09-07 DIAGNOSIS — E1169 Type 2 diabetes mellitus with other specified complication: Secondary | ICD-10-CM | POA: Diagnosis not present

## 2023-09-07 DIAGNOSIS — I480 Paroxysmal atrial fibrillation: Secondary | ICD-10-CM | POA: Diagnosis not present

## 2023-09-07 DIAGNOSIS — N1831 Chronic kidney disease, stage 3a: Secondary | ICD-10-CM | POA: Diagnosis not present

## 2023-09-07 DIAGNOSIS — L89312 Pressure ulcer of right buttock, stage 2: Secondary | ICD-10-CM | POA: Diagnosis not present

## 2023-09-07 DIAGNOSIS — I110 Hypertensive heart disease with heart failure: Secondary | ICD-10-CM | POA: Diagnosis not present

## 2023-09-08 DIAGNOSIS — E785 Hyperlipidemia, unspecified: Secondary | ICD-10-CM | POA: Diagnosis not present

## 2023-09-08 DIAGNOSIS — E1169 Type 2 diabetes mellitus with other specified complication: Secondary | ICD-10-CM | POA: Diagnosis not present

## 2023-09-08 DIAGNOSIS — E1122 Type 2 diabetes mellitus with diabetic chronic kidney disease: Secondary | ICD-10-CM | POA: Diagnosis not present

## 2023-09-08 DIAGNOSIS — I5033 Acute on chronic diastolic (congestive) heart failure: Secondary | ICD-10-CM | POA: Diagnosis not present

## 2023-09-08 DIAGNOSIS — L89312 Pressure ulcer of right buttock, stage 2: Secondary | ICD-10-CM | POA: Diagnosis not present

## 2023-09-08 DIAGNOSIS — I7121 Aneurysm of the ascending aorta, without rupture: Secondary | ICD-10-CM | POA: Diagnosis not present

## 2023-09-08 DIAGNOSIS — I480 Paroxysmal atrial fibrillation: Secondary | ICD-10-CM | POA: Diagnosis not present

## 2023-09-08 DIAGNOSIS — I110 Hypertensive heart disease with heart failure: Secondary | ICD-10-CM | POA: Diagnosis not present

## 2023-09-08 DIAGNOSIS — N1831 Chronic kidney disease, stage 3a: Secondary | ICD-10-CM | POA: Diagnosis not present

## 2023-09-11 DIAGNOSIS — E1169 Type 2 diabetes mellitus with other specified complication: Secondary | ICD-10-CM | POA: Diagnosis not present

## 2023-09-11 DIAGNOSIS — L89312 Pressure ulcer of right buttock, stage 2: Secondary | ICD-10-CM | POA: Diagnosis not present

## 2023-09-11 DIAGNOSIS — I110 Hypertensive heart disease with heart failure: Secondary | ICD-10-CM | POA: Diagnosis not present

## 2023-09-11 DIAGNOSIS — I480 Paroxysmal atrial fibrillation: Secondary | ICD-10-CM | POA: Diagnosis not present

## 2023-09-11 DIAGNOSIS — I5033 Acute on chronic diastolic (congestive) heart failure: Secondary | ICD-10-CM | POA: Diagnosis not present

## 2023-09-11 DIAGNOSIS — E1122 Type 2 diabetes mellitus with diabetic chronic kidney disease: Secondary | ICD-10-CM | POA: Diagnosis not present

## 2023-09-11 DIAGNOSIS — E785 Hyperlipidemia, unspecified: Secondary | ICD-10-CM | POA: Diagnosis not present

## 2023-09-11 DIAGNOSIS — I7121 Aneurysm of the ascending aorta, without rupture: Secondary | ICD-10-CM | POA: Diagnosis not present

## 2023-09-11 DIAGNOSIS — N1831 Chronic kidney disease, stage 3a: Secondary | ICD-10-CM | POA: Diagnosis not present

## 2023-09-12 DIAGNOSIS — I7121 Aneurysm of the ascending aorta, without rupture: Secondary | ICD-10-CM | POA: Diagnosis not present

## 2023-09-12 DIAGNOSIS — I5033 Acute on chronic diastolic (congestive) heart failure: Secondary | ICD-10-CM | POA: Diagnosis not present

## 2023-09-12 DIAGNOSIS — L89312 Pressure ulcer of right buttock, stage 2: Secondary | ICD-10-CM | POA: Diagnosis not present

## 2023-09-12 DIAGNOSIS — I110 Hypertensive heart disease with heart failure: Secondary | ICD-10-CM | POA: Diagnosis not present

## 2023-09-12 DIAGNOSIS — E785 Hyperlipidemia, unspecified: Secondary | ICD-10-CM | POA: Diagnosis not present

## 2023-09-12 DIAGNOSIS — N1831 Chronic kidney disease, stage 3a: Secondary | ICD-10-CM | POA: Diagnosis not present

## 2023-09-12 DIAGNOSIS — E1122 Type 2 diabetes mellitus with diabetic chronic kidney disease: Secondary | ICD-10-CM | POA: Diagnosis not present

## 2023-09-12 DIAGNOSIS — I480 Paroxysmal atrial fibrillation: Secondary | ICD-10-CM | POA: Diagnosis not present

## 2023-09-12 DIAGNOSIS — E1169 Type 2 diabetes mellitus with other specified complication: Secondary | ICD-10-CM | POA: Diagnosis not present

## 2023-09-14 DIAGNOSIS — I480 Paroxysmal atrial fibrillation: Secondary | ICD-10-CM | POA: Diagnosis not present

## 2023-09-14 DIAGNOSIS — L89312 Pressure ulcer of right buttock, stage 2: Secondary | ICD-10-CM | POA: Diagnosis not present

## 2023-09-14 DIAGNOSIS — I5033 Acute on chronic diastolic (congestive) heart failure: Secondary | ICD-10-CM | POA: Diagnosis not present

## 2023-09-14 DIAGNOSIS — E1169 Type 2 diabetes mellitus with other specified complication: Secondary | ICD-10-CM | POA: Diagnosis not present

## 2023-09-14 DIAGNOSIS — E1122 Type 2 diabetes mellitus with diabetic chronic kidney disease: Secondary | ICD-10-CM | POA: Diagnosis not present

## 2023-09-14 DIAGNOSIS — N1831 Chronic kidney disease, stage 3a: Secondary | ICD-10-CM | POA: Diagnosis not present

## 2023-09-14 DIAGNOSIS — I7121 Aneurysm of the ascending aorta, without rupture: Secondary | ICD-10-CM | POA: Diagnosis not present

## 2023-09-14 DIAGNOSIS — E785 Hyperlipidemia, unspecified: Secondary | ICD-10-CM | POA: Diagnosis not present

## 2023-09-14 DIAGNOSIS — I110 Hypertensive heart disease with heart failure: Secondary | ICD-10-CM | POA: Diagnosis not present

## 2023-09-15 DIAGNOSIS — L89312 Pressure ulcer of right buttock, stage 2: Secondary | ICD-10-CM | POA: Diagnosis not present

## 2023-09-15 DIAGNOSIS — N1831 Chronic kidney disease, stage 3a: Secondary | ICD-10-CM | POA: Diagnosis not present

## 2023-09-15 DIAGNOSIS — E1122 Type 2 diabetes mellitus with diabetic chronic kidney disease: Secondary | ICD-10-CM | POA: Diagnosis not present

## 2023-09-15 DIAGNOSIS — E785 Hyperlipidemia, unspecified: Secondary | ICD-10-CM | POA: Diagnosis not present

## 2023-09-15 DIAGNOSIS — I7121 Aneurysm of the ascending aorta, without rupture: Secondary | ICD-10-CM | POA: Diagnosis not present

## 2023-09-15 DIAGNOSIS — I480 Paroxysmal atrial fibrillation: Secondary | ICD-10-CM | POA: Diagnosis not present

## 2023-09-15 DIAGNOSIS — I110 Hypertensive heart disease with heart failure: Secondary | ICD-10-CM | POA: Diagnosis not present

## 2023-09-15 DIAGNOSIS — E1169 Type 2 diabetes mellitus with other specified complication: Secondary | ICD-10-CM | POA: Diagnosis not present

## 2023-09-15 DIAGNOSIS — I5033 Acute on chronic diastolic (congestive) heart failure: Secondary | ICD-10-CM | POA: Diagnosis not present

## 2023-09-18 ENCOUNTER — Telehealth: Payer: Self-pay | Admitting: Cardiology

## 2023-09-18 DIAGNOSIS — I130 Hypertensive heart and chronic kidney disease with heart failure and stage 1 through stage 4 chronic kidney disease, or unspecified chronic kidney disease: Secondary | ICD-10-CM | POA: Diagnosis not present

## 2023-09-18 DIAGNOSIS — I5032 Chronic diastolic (congestive) heart failure: Secondary | ICD-10-CM | POA: Diagnosis not present

## 2023-09-18 DIAGNOSIS — I110 Hypertensive heart disease with heart failure: Secondary | ICD-10-CM | POA: Diagnosis not present

## 2023-09-18 DIAGNOSIS — L89312 Pressure ulcer of right buttock, stage 2: Secondary | ICD-10-CM | POA: Diagnosis not present

## 2023-09-18 DIAGNOSIS — Z7901 Long term (current) use of anticoagulants: Secondary | ICD-10-CM | POA: Diagnosis not present

## 2023-09-18 DIAGNOSIS — E1169 Type 2 diabetes mellitus with other specified complication: Secondary | ICD-10-CM | POA: Diagnosis not present

## 2023-09-18 DIAGNOSIS — E1122 Type 2 diabetes mellitus with diabetic chronic kidney disease: Secondary | ICD-10-CM | POA: Diagnosis not present

## 2023-09-18 DIAGNOSIS — E785 Hyperlipidemia, unspecified: Secondary | ICD-10-CM | POA: Diagnosis not present

## 2023-09-18 DIAGNOSIS — I480 Paroxysmal atrial fibrillation: Secondary | ICD-10-CM | POA: Diagnosis not present

## 2023-09-18 DIAGNOSIS — N1831 Chronic kidney disease, stage 3a: Secondary | ICD-10-CM | POA: Diagnosis not present

## 2023-09-18 DIAGNOSIS — I4891 Unspecified atrial fibrillation: Secondary | ICD-10-CM | POA: Diagnosis not present

## 2023-09-18 DIAGNOSIS — F4321 Adjustment disorder with depressed mood: Secondary | ICD-10-CM | POA: Diagnosis not present

## 2023-09-18 DIAGNOSIS — I7121 Aneurysm of the ascending aorta, without rupture: Secondary | ICD-10-CM | POA: Diagnosis not present

## 2023-09-18 DIAGNOSIS — I5033 Acute on chronic diastolic (congestive) heart failure: Secondary | ICD-10-CM | POA: Diagnosis not present

## 2023-09-18 NOTE — Telephone Encounter (Signed)
 Returned call to number that called. Left message to call back.  Dr Shlomo stated pt could take 40mg  lasix  daily until appointment on 8/20

## 2023-09-18 NOTE — Telephone Encounter (Signed)
 Pt c/o swelling/edema: STAT if pt has developed SOB within 24 hours  If swelling, where is the swelling located? Legs   How much weight have you gained and in what time span? 5 lbs in 3 days   Have you gained 2 pounds in a day or 5 pounds in a week? 5 lbs in 3 days   Do you have a log of your daily weights (if so, list)?  164.6 - 8/18  159.2 - 8/15  Are you currently taking a fluid pill? Yes   Are you currently SOB? No   Have you traveled recently in a car or plane for an extended period of time? No  Pt is still scheduled to see Dr. Shlomo 8/20

## 2023-09-19 DIAGNOSIS — E1122 Type 2 diabetes mellitus with diabetic chronic kidney disease: Secondary | ICD-10-CM | POA: Diagnosis not present

## 2023-09-19 DIAGNOSIS — E1169 Type 2 diabetes mellitus with other specified complication: Secondary | ICD-10-CM | POA: Diagnosis not present

## 2023-09-19 DIAGNOSIS — I7121 Aneurysm of the ascending aorta, without rupture: Secondary | ICD-10-CM | POA: Diagnosis not present

## 2023-09-19 DIAGNOSIS — I110 Hypertensive heart disease with heart failure: Secondary | ICD-10-CM | POA: Diagnosis not present

## 2023-09-19 DIAGNOSIS — L89312 Pressure ulcer of right buttock, stage 2: Secondary | ICD-10-CM | POA: Diagnosis not present

## 2023-09-19 DIAGNOSIS — I480 Paroxysmal atrial fibrillation: Secondary | ICD-10-CM | POA: Diagnosis not present

## 2023-09-19 DIAGNOSIS — N1831 Chronic kidney disease, stage 3a: Secondary | ICD-10-CM | POA: Diagnosis not present

## 2023-09-19 DIAGNOSIS — I5033 Acute on chronic diastolic (congestive) heart failure: Secondary | ICD-10-CM | POA: Diagnosis not present

## 2023-09-19 DIAGNOSIS — E785 Hyperlipidemia, unspecified: Secondary | ICD-10-CM | POA: Diagnosis not present

## 2023-09-20 ENCOUNTER — Ambulatory Visit: Attending: Cardiology | Admitting: Cardiology

## 2023-09-20 ENCOUNTER — Encounter: Payer: Self-pay | Admitting: Cardiology

## 2023-09-20 VITALS — BP 142/62 | HR 55 | Ht 64.0 in | Wt 167.4 lb

## 2023-09-20 DIAGNOSIS — Z79899 Other long term (current) drug therapy: Secondary | ICD-10-CM

## 2023-09-20 DIAGNOSIS — E785 Hyperlipidemia, unspecified: Secondary | ICD-10-CM | POA: Diagnosis not present

## 2023-09-20 DIAGNOSIS — I5033 Acute on chronic diastolic (congestive) heart failure: Secondary | ICD-10-CM | POA: Diagnosis not present

## 2023-09-20 DIAGNOSIS — I480 Paroxysmal atrial fibrillation: Secondary | ICD-10-CM | POA: Diagnosis not present

## 2023-09-20 DIAGNOSIS — I340 Nonrheumatic mitral (valve) insufficiency: Secondary | ICD-10-CM

## 2023-09-20 DIAGNOSIS — I2510 Atherosclerotic heart disease of native coronary artery without angina pectoris: Secondary | ICD-10-CM

## 2023-09-20 DIAGNOSIS — I10 Essential (primary) hypertension: Secondary | ICD-10-CM

## 2023-09-20 DIAGNOSIS — N1831 Chronic kidney disease, stage 3a: Secondary | ICD-10-CM | POA: Diagnosis not present

## 2023-09-20 DIAGNOSIS — E1169 Type 2 diabetes mellitus with other specified complication: Secondary | ICD-10-CM | POA: Diagnosis not present

## 2023-09-20 DIAGNOSIS — I5032 Chronic diastolic (congestive) heart failure: Secondary | ICD-10-CM

## 2023-09-20 DIAGNOSIS — E1122 Type 2 diabetes mellitus with diabetic chronic kidney disease: Secondary | ICD-10-CM | POA: Diagnosis not present

## 2023-09-20 DIAGNOSIS — I7121 Aneurysm of the ascending aorta, without rupture: Secondary | ICD-10-CM | POA: Diagnosis not present

## 2023-09-20 DIAGNOSIS — L89312 Pressure ulcer of right buttock, stage 2: Secondary | ICD-10-CM | POA: Diagnosis not present

## 2023-09-20 DIAGNOSIS — I110 Hypertensive heart disease with heart failure: Secondary | ICD-10-CM | POA: Diagnosis not present

## 2023-09-20 MED ORDER — SPIRONOLACTONE 25 MG PO TABS: 25.0000 mg | ORAL_TABLET | Freq: Every day | ORAL | 3 refills | Status: AC

## 2023-09-20 NOTE — Addendum Note (Signed)
 Addended by: JANIT GENI CROME on: 09/20/2023 03:30 PM   Modules accepted: Orders

## 2023-09-20 NOTE — Patient Instructions (Signed)
 Medication Instructions:  Please INCREASE your dose of spironolactone  to 25 mg daily *If you need a refill on your cardiac medications before your next appointment, please call your pharmacy*  Lab Work: Please complete a BMET today in our first floor lab before you leave.   Then, please complete a BMET in 1 week. If you don't do them at [facility] please have the lab fax the results to us  at [phone].   If you have labs (blood work) drawn today and your tests are completely normal, you will receive your results only by: MyChart Message (if you have MyChart) OR A paper copy in the mail If you have any lab test that is abnormal or we need to change your treatment, we will call you to review the results.  Testing/Procedures: None.   Follow-Up: At [facility], you and your health needs are our priority.  As part of our continuing mission to provide you with exceptional heart care, our providers are all part of one team.  This team includes your primary Cardiologist (physician) and Advanced Practice Providers or APPs (Physician Assistants and Nurse Practitioners) who all work together to provide you with the care you need, when you need it.  Your next appointment:   3 month(s)  Provider:   Josefa Beauvais, NP, Orren Fabry, PA-C, Jackee Alberts, NP, Lum Louis, NP, Aline Door, PA-C, Hao Meng, PA-C, Damien Braver, NP, or Glendia Ferrier, PA-C      Then, Wilbert Bihari, MD will plan to see you again in 6 month(s).

## 2023-09-20 NOTE — Progress Notes (Signed)
 Cardiology Office Note:    Date:  09/20/2023  ID:  [name], DOB [birth year], MRN [id number] PCP: [name]  [facility] Providers Cardiologist:  Hailey Bihari, MD       Patient Profile:      Thoracic aortic aneurysm CT 08/2020: 44 mm // CT 05/2021: 44 mm // CT 12/2021: 46 mm (HFpEF) heart failure with preserved ejection fraction  TTE 07/01/21: EF 60-65, no RWMA, mild LVH, normal RVSF, trivial MR, trivial AI, aortic root 37 mm, ascending aorta 41 mm  TTE 01/03/2022: Basal inferior HK/AK, EF 55-60, GR 1 DD, low normal RVSF, normal PASP, mild LAE, mild MR, trivial AI, mean AV gradient 5 mmHg, ascending aorta 39 mm, RAP 3 Paroxysmal atrial fibrillation  Admx 12/2021 w AF w RVR, +hsT (132>>2858) c/w NSTEMI (prob demand ischemia) Pt declined LHC; OP ischemic eval if pt ok to proceed to cath if abnl Spont conversion to NSR; Eliquis  started Admx 01/2022 w gastroenteritis and recurrent AF w RVR + a/c CHF >> Amiodarone   Monitor 08/2022: Sinus bradycardia; 8 runs of SVT (atrial tachycardia) Coronary artery calcification on CT Cardiac catheterization 2002: Normal coronary arteries Myoview 2017 low risk Admx 12/2021 w ? 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 She was last seen in June 2023 and had two hospital admissions in December 2023 and January 2024. In December 2023, she was admitted with atrial fibrillation with a rapid rate, which spontaneously converted to sinus rhythm. Her hs-Troponins were markedly elevated, consistent with NSTEMI. She opted for conservative management and did not pursue invasive evaluation. In January 2024, she was readmitted with gastroenteritis, complicated by recurrent atrial fibrillation and decompensated heart failure. She was placed on amiodarone , which restored sinus rhythm.  She has recently been seen by my physician assistants with difficulty with blood pressure control and palpitations.  She lives at home with her 76 year old husband, who has health problems, and they have caregivers. She she has had problems with her heart rate and creasing due to stress when dealing with the caregiver. She also has had problems with lower extremity swelling, which goes down at night and comes up during the day. She has been weighing herself and has been instructed to increase her furosemide  dosage if she gains three pounds or more overnight. However, she has not had to do this very often.    She was hospitalized in May 2025 after an unwitnessed fall from assisted living facility.  She was found to be in A-fib with RVR.  Her amiodarone  has been stopped apparently for unknown reason.[name]  She was started back on amiodarone .  She was seen back on 07/04/2023 by [name] and has not felt any more A-fib.  She was continued on amiodarone  200 mg  daily, Lopressor  25 mg twice daily and apixaban  5 mg twice daily.  Her blood pressure was running high and her hydralazine  was increased to 10 mg 3 times daily.   She was seen in the ER on 09/06/23 due to LE edema but otherwise was asymptomatic.  She was given IV Lasix .  Her labs showed SCr 1.88 and K+ 4.4.  BNP was 252.  Cxray  showed mild edema.  She was discharged home from the ER. She then called in here on 8/18 because she had gained 5lbs in 3 days and her lasix  was increased to 40mg  daily.     She is back today for follow-up and is doing well.  She still has LE edema today but thinks it is better.  She has tried to wear compression hose but cannot get them.  She tries to elevate her legs when sitting. She has chronic DOE and feels it is at baseline. She denies any chest pain or pressure, orthopnea, dizziness, palpitations or syncope. She works with PT.    ROS:  See HPI No melena, hematochezia, hematuria. She does have occasional bleeding hemorrhoids.    Studies Reviewed:        Risk Assessment/Calculations:    CHA2DS2-VASc Score = 7   This indicates a 11.2% annual risk of stroke. The patient's score is based upon: CHF History: 1 HTN History: 1 Diabetes History: 1 Stroke History: 0 Vascular Disease History: 1 Age Score: 2 Gender Score: 1           Physical Exam:   VS:  BP (!) 142/62   Pulse (!) 55   Ht 5' 4 (1.626 m)   Wt 167 lb 6.4 oz (75.9 kg)   SpO2 99%   BMI 28.73 kg/m    Wt Readings from Last 3 Encounters:  09/20/23 167 lb 6.4 oz (75.9 kg)  07/19/23 158 lb 1.1 oz (71.7 kg)  06/23/23 163 lb 5.8 oz (74.1 kg)  GEN: Well nourished, well developed in no acute distress HEENT: Normal NECK: No JVD; No carotid bruits LYMPHATICS: No lymphadenopathy CARDIAC:RRR, no murmurs, rubs, gallops RESPIRATORY:  Clear to auscultation without rales, wheezing or rhonchi  ABDOMEN: Soft, non-tender, non-distended MUSCULOSKELETAL:  2-3+ pitting edema; No deformity  SKIN: Warm and dry NEUROLOGIC:  Alert and oriented x 3 PSYCHIATRIC:  Normal affect    Assessment and Plan:    [facility] Previous hospitalizations in December 2023 and January 2024 for atrial fibrillation.  She is currently in sinus rhythm on Amiodarone  200mg  daily. She notes palpitations with increased stress only.  She did have a few episodes of ATach on monitor in July 2024.  -Recent hospitalization May 2025 with recurrent A-fib with RVR and for some reason she had stopped taking amiodarone .  Amiodarone  was restarted at 200 mg daily and then decreased in June to 200 mg daily -She has not had any further palpitations or A-fib that she is aware of -Denies any bleeding issues on DOAC -I have personally reviewed and interpreted outside labs performed by patient's PCP which showed serum creatinine 1.18, potassium 4.4, hemoglobin 11.5 on 09/06/2023, ALT 15 on 06/18/2023 and TSH 1.3 on 06/21/2023 -Encouraged to continue yearly eye exams -Continue apixaban  5 mg twice daily, amiodarone  200 mg daily, Lopressor  25 mg twice daily with PRN refills  (HFpEF) heart failure with preserved ejection fraction (HCC) Mild pulmonary hypertension -2D echo 06/18/2023 with a EF 65 to 70% and PASP 44 mmHg -Has had several hospitalizations in the past year  for decompensated heart failure in the setting of AFib with RVR.  - -Admitted with volume overload and A-fib with RVR in May and got a dose of IV Lasix  but was not felt to be markedly volume overloaded  - She has done well since her hospitalization.  She does have chronic lower extremity edema which is stable  - She is NYHA IIb. - She still cannot get compression hose on she has persistent 2+ LE edema>.encouraged her to keep her legs elevated above her heart when sitting - she is salting her food and I encouraged her to not use any added salt>Ok to use Mrs. Dash. Unfortunately she lives in ALF so cannot control what Na is added to her food - continue Lasix  40 mg daily, hydralazine  10 mg 3 times daily,  losartan  25 mg daily and Lopressor  25 mg twice daily with as needed refills - increase spironolactone  to 25mg  daily - Recommend she take extra Furosemide  20 mg for weight gain > 3 lbs in 1 day. - No SGLT2i given advanced age and increased risk of UTI - repeat BMET today  Ascending  aortic aneurysm (HCC) - Stable at 46mm on most recent CT in December 2023. - CT 06/20/2023 measured ascending aorta measured 45 mm   Coronary artery calcification seen on CT scan Coronary calcification noted on CT scan in December 2023, managed medically per patient preference.  Troponins were elevated in December 2023 in the setting of atrial fibrillation with rapid rate.  The seem to be consistent with non-ST elevation myocardial infarction versus demand ischemia.  She preferred conservative management and did not undergo cardiac catheterization. - She denies any anginal symptoms  - Continue Lopressor  25 mg twice daily - No ASA due to DOAC - Statin intolerant   Essential hypertension - BP stable today on exam   - Continue hydralazine  10 mg 3 times daily, losartan  25 mg daily, Lopressor  25 mg twice daily with as needed refills - increasing Spiro to 25mg  daily   Mitral regurgitation Mild mitral regurgitation by echo 06/18/2023       Dispo:  3 months followup with extender and 6 months with me  Signed, Hailey Bihari, MD

## 2023-09-20 NOTE — Telephone Encounter (Signed)
 Patient seen today in clinic 09/20/23.

## 2023-09-21 ENCOUNTER — Ambulatory Visit: Payer: Self-pay | Admitting: Cardiology

## 2023-09-21 ENCOUNTER — Institutional Professional Consult (permissible substitution) (INDEPENDENT_AMBULATORY_CARE_PROVIDER_SITE_OTHER): Admitting: Physician Assistant

## 2023-09-21 DIAGNOSIS — I110 Hypertensive heart disease with heart failure: Secondary | ICD-10-CM | POA: Diagnosis not present

## 2023-09-21 DIAGNOSIS — F4321 Adjustment disorder with depressed mood: Secondary | ICD-10-CM | POA: Diagnosis not present

## 2023-09-21 DIAGNOSIS — L89312 Pressure ulcer of right buttock, stage 2: Secondary | ICD-10-CM | POA: Diagnosis not present

## 2023-09-21 DIAGNOSIS — G4700 Insomnia, unspecified: Secondary | ICD-10-CM | POA: Diagnosis not present

## 2023-09-21 DIAGNOSIS — I5033 Acute on chronic diastolic (congestive) heart failure: Secondary | ICD-10-CM | POA: Diagnosis not present

## 2023-09-21 DIAGNOSIS — F411 Generalized anxiety disorder: Secondary | ICD-10-CM | POA: Diagnosis not present

## 2023-09-21 LAB — BASIC METABOLIC PANEL WITH GFR
BUN/Creatinine Ratio: 22 (ref 12–28)
BUN: 29 mg/dL — AB (ref 8–27)
CO2: 24 mmol/L (ref 20–29)
Calcium: 9.6 mg/dL (ref 8.7–10.3)
Chloride: 96 mmol/L (ref 96–106)
Creatinine, Ser: 1.29 mg/dL — AB (ref 0.57–1.00)
Glucose: 139 mg/dL — AB (ref 70–99)
Potassium: 4.6 mmol/L (ref 3.5–5.2)
Sodium: 138 mmol/L (ref 134–144)
eGFR: 40 mL/min/1.73 — AB (ref >59)

## 2023-09-22 DIAGNOSIS — E785 Hyperlipidemia, unspecified: Secondary | ICD-10-CM | POA: Diagnosis not present

## 2023-09-22 DIAGNOSIS — E1122 Type 2 diabetes mellitus with diabetic chronic kidney disease: Secondary | ICD-10-CM | POA: Diagnosis not present

## 2023-09-22 DIAGNOSIS — I480 Paroxysmal atrial fibrillation: Secondary | ICD-10-CM | POA: Diagnosis not present

## 2023-09-22 DIAGNOSIS — I7121 Aneurysm of the ascending aorta, without rupture: Secondary | ICD-10-CM | POA: Diagnosis not present

## 2023-09-22 DIAGNOSIS — I5033 Acute on chronic diastolic (congestive) heart failure: Secondary | ICD-10-CM | POA: Diagnosis not present

## 2023-09-22 DIAGNOSIS — N1831 Chronic kidney disease, stage 3a: Secondary | ICD-10-CM | POA: Diagnosis not present

## 2023-09-22 DIAGNOSIS — I110 Hypertensive heart disease with heart failure: Secondary | ICD-10-CM | POA: Diagnosis not present

## 2023-09-22 DIAGNOSIS — E1169 Type 2 diabetes mellitus with other specified complication: Secondary | ICD-10-CM | POA: Diagnosis not present

## 2023-09-22 DIAGNOSIS — L89312 Pressure ulcer of right buttock, stage 2: Secondary | ICD-10-CM | POA: Diagnosis not present

## 2023-09-25 ENCOUNTER — Encounter (INDEPENDENT_AMBULATORY_CARE_PROVIDER_SITE_OTHER): Payer: Self-pay | Admitting: Physician Assistant

## 2023-09-25 ENCOUNTER — Ambulatory Visit (INDEPENDENT_AMBULATORY_CARE_PROVIDER_SITE_OTHER): Admitting: Physician Assistant

## 2023-09-25 VITALS — BP 185/79 | HR 64

## 2023-09-25 DIAGNOSIS — H9190 Unspecified hearing loss, unspecified ear: Secondary | ICD-10-CM | POA: Diagnosis not present

## 2023-09-25 DIAGNOSIS — I5033 Acute on chronic diastolic (congestive) heart failure: Secondary | ICD-10-CM | POA: Diagnosis not present

## 2023-09-25 DIAGNOSIS — I10 Essential (primary) hypertension: Secondary | ICD-10-CM | POA: Diagnosis not present

## 2023-09-25 DIAGNOSIS — H9193 Unspecified hearing loss, bilateral: Secondary | ICD-10-CM

## 2023-09-25 NOTE — Progress Notes (Addendum)
 Dear Dr. Perley, Here is my assessment for our mutual patient, [name]. Thank you for allowing me the opportunity to care for your patient. Please do not hesitate to contact me should you have any other questions. Sincerely, Chyrl Cohen PA-C  Otolaryngology Clinic Note Referring provider: Dr. Perley HPI:  [name] is a 89 y.o. female kindly referred by Dr. Perley   The patient is an 89 year old female seen in our office for evaluation of decreased hearing.  The patient notes that her nephew told her that she should have a hearing evaluation as she has difficulty hearing him on the phone.  She notes that she has no significant difficulty in day-to-day interactions.  She has not noticed any blood in around here complaining that she cannot hear very well.  She denies any significant history of cerumen impaction, no trauma to the ears.  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  She did see her primary care provider on August 28, 2023   Independent Review of Additional Tests or Records:  PCP visit on August 28, 2023   PMH/Meds/All/SocHx/FamHx/ROS:   Past Medical History:  Diagnosis Date   Abdominal pain, chronic, left lower quadrant    Acute torn meniscus of knee    Adenomatous colon polyp 1970   carcinoma in situ   Allergic rhinitis    Amaurosis fugax 08/07/2012   Anxiety    Ascending aortic aneurysm (HCC) 12/07/2015   44mm by chest CTA 08/2020   Benign essential tremor syndrome    Bicuspid aortic valve    no AS on 07/2019   Carotid stenosis    1039 bilateral by dopplers 03/2017.    colon ca dx'd 1970   surg only   Coronary artery calcification seen on CT scan 09/26/2022   - Cath in 2002 normal - Myoview in 2017 low risk - Admx in 12/2021 w AF w RVR and elevated hsT c/w NSTEMI - pt preferred conservative mgmt (no cath)    DDD (degenerative disc disease)    Diverticulitis    Diverticulosis    DM (diabetes mellitus) (HCC)    Duodenitis    peptic, with  gastric heterotopia   Endometriosis    s/p hysterectomy   Fundic gland polyposis of stomach    GERD (gastroesophageal reflux disease)    Heart murmur    Hiatal hernia 02/03/2005   History of cerebral aneurysm repair    s/p coiling   History of hemorrhoids    with bleeding   History of shingles    HLD (hyperlipidemia)    HTN (hypertension)    Hypercholesteremia    IBS (irritable bowel syndrome)    Iron deficiency    Lung nodule 07/12/2021   CT 05/2021: 3 mm right solid pulmonary nodule-no routine follow-up imaging recommended   Migraine    MVP (mitral valve prolapse)    Osteopenia    Peripheral neuropathy    Toe fracture, right    second toe   UTI (lower urinary tract infection)    Varicose vein      Past Surgical History:  Procedure Laterality Date   ABDOMINAL HYSTERECTOMY     APPENDECTOMY     CARPAL TUNNEL RELEASE  08/26/07   CATARACT EXTRACTION     CEREBRAL ANEURYSM REPAIR     COLON RESECTION     COLONOSCOPY  06/07/08   divertiulosis, internal hemorrhoids   COLONOSCOPY W/ BIOPSIES AND POLYPECTOMY  02/03/05   diverticulosis, 4 mm sessile polyps, internal  and external hemorrhoids   CORONARY ANGIOPLASTY WITH STENT PLACEMENT     ESOPHAGOGASTRODUODENOSCOPY  02/03/05   hiatal hernia, 6 benign gastric polyps   FLEXIBLE SIGMOIDOSCOPY     HAND SURGERY Right    INTRAOCULAR LENS INSERTION     right hand decompressive fasciotomy  08/26/07   , dorsal and volar   TONSILLECTOMY      Family History  Problem Relation Age of Onset   Ovarian cancer Mother    Heart disease Father    Kidney disease Father    Diabetes Paternal Grandmother    Diabetes Brother    Hypertension Brother    Heart disease Brother    Diabetes Brother    Heart disease Brother    Microcephaly Brother    Diabetes Other    Colon cancer Other      Social Connections: Socially Isolated (07/13/2023)   Social Connection and Isolation Panel    Frequency of Communication with Friends and Family: More than three  times a week    Frequency of Social Gatherings with Friends and Family: More than three times a week    Attends Religious Services: Never    Database administrator or Organizations: No    Attends Banker Meetings: Never    Marital Status: Widowed      Current Outpatient Medications:    acetaminophen  (TYLENOL ) 500 MG tablet, Take 500 mg by mouth every 6 (six) hours as needed for mild pain, moderate pain, fever or headache., Disp: , Rfl:    aluminum -magnesium  hydroxide 200-200 MG/5ML suspension, Take 15 mLs by mouth as needed for indigestion., Disp: 355 mL, Rfl: 0   amiodarone  (PACERONE ) 200 MG tablet, Take 1 tablet (200 mg total) by mouth daily., Disp: 90 tablet, Rfl: 3   apixaban  (ELIQUIS ) 5 MG TABS tablet, Take 1 tablet (5 mg total) by mouth 2 (two) times daily., Disp: 180 tablet, Rfl: 1   busPIRone  (BUSPAR ) 5 MG tablet, Take 5 mg by mouth 2 (two) times daily., Disp: , Rfl:    diclofenac  Sodium (VOLTAREN ) 1 % GEL, Apply 2 g topically 4 (four) times daily as needed. Bilateral shoulders, Disp: 50 g, Rfl: 0   docusate sodium  (COLACE) 100 MG capsule, Take 100 mg by mouth daily as needed for moderate constipation., Disp: , Rfl:    furosemide  (LASIX ) 20 MG tablet, Take 1 tablet (20 mg total) by mouth daily., Disp: 30 tablet, Rfl: 0   hydrALAZINE  (APRESOLINE ) 10 MG tablet, Take 1 tablet (10 mg total) by mouth 3 (three) times daily., Disp: 270 tablet, Rfl: 3   insulin  glargine (LANTUS ) 100 UNIT/ML Solostar Pen, Inject 18 Units into the skin in the morning., Disp: 10 mL, Rfl: 0   LORazepam  (ATIVAN ) 0.5 MG tablet, Take 1 tablet (0.5 mg total) by mouth every 12 (twelve) hours as needed for anxiety., Disp: 10 tablet, Rfl: 0   losartan  (COZAAR ) 25 MG tablet, Take 1 tablet (25 mg total) by mouth daily., Disp: 30 tablet, Rfl: 0   melatonin 3 MG TABS tablet, Take 3 mg by mouth at bedtime., Disp: , Rfl:    metoprolol  tartrate (LOPRESSOR ) 25 MG tablet, Take 1 tablet (25 mg total) by mouth 2 (two)  times daily., Disp: , Rfl:    Multiple Vitamin (MULTIVITAMIN) tablet, Take 1 tablet by mouth in the morning., Disp: , Rfl:    ondansetron  (ZOFRAN ) 4 MG tablet, Take 1 tablet (4 mg total) by mouth every 8 (eight) hours as needed for nausea or vomiting.,  Disp: 30 tablet, Rfl: 0   pantoprazole  (PROTONIX ) 40 MG tablet, TAKE 1 TABLET BY MOUTH EVERY DAY BEFORE BREAKFAST (Patient taking differently: Take 40 mg by mouth daily.), Disp: 90 tablet, Rfl: 0   polyethylene glycol (MIRALAX  / GLYCOLAX ) 17 g packet, Take 17 g by mouth daily as needed for moderate constipation., Disp: , Rfl:    spironolactone  (ALDACTONE ) 25 MG tablet, Take 1 tablet (25 mg total) by mouth daily., Disp: 90 tablet, Rfl: 3   Physical Exam:   BP (!) 185/79 Comment: first attempt 185/79 second attempt 186/69 patient advised that she did take her bp medicine this morning  Pulse 64   SpO2 97%   Pertinent Findings  CN II-XII intact Bilateral EAC clear and TM intact with well pneumatized middle ear spaces Anterior rhinoscopy: Septum midline; bilateral inferior turbinates with no hypertrophy No lesions of oral cavity/oropharynx; No obviously palpable neck masses/lymphadenopathy/thyromegaly No respiratory distress or stridor  Seprately Identifiable Procedures:  None  Impression & Plans:  Ilea Hilton is a 89 y.o. female with the following   Hearing loss-  Patient seen in the office today for evaluation of hearing loss.  I did recommend an audiological evaluation.  The patient reports that her insurance will not cover hearing aids, she notes she is 89 years old and does not want any intervention.  She does not even feel that she has significant hearing loss and has no difficulty with day-to-day functions.  She essentially wanted to make sure she did not have cerumen in her external auditory canals that may be causing any degree of hearing loss.  I agree that given her age and ability to continue a normal quality of life with regards  to hearing further audiological evaluation will be unlikely to provide any actionable evidence.  The patient may follow-up with me on a as needed basis.  She is very happy with this plan.  Additionally the patient's blood pressure was elevated at 185/79, she notes that she did take her morning blood pressure medication and will take it again in the evening.  I informed her that 185/79 is significantly elevated and she needed to discuss this with her primary care provider.  She is essentially asymptomatic from it at this time.   - f/u PRN   Thank you for allowing me the opportunity to care for your patient. Please do not hesitate to contact me should you have any other questions.  Sincerely, Chyrl Cohen PA-C [facility] Phone: [phone] Fax: [phone]  09/25/2023, 1:42 PM

## 2023-09-26 DIAGNOSIS — I5033 Acute on chronic diastolic (congestive) heart failure: Secondary | ICD-10-CM | POA: Diagnosis not present

## 2023-09-26 DIAGNOSIS — I480 Paroxysmal atrial fibrillation: Secondary | ICD-10-CM | POA: Diagnosis not present

## 2023-09-26 DIAGNOSIS — I110 Hypertensive heart disease with heart failure: Secondary | ICD-10-CM | POA: Diagnosis not present

## 2023-09-26 DIAGNOSIS — E785 Hyperlipidemia, unspecified: Secondary | ICD-10-CM | POA: Diagnosis not present

## 2023-09-26 DIAGNOSIS — E1169 Type 2 diabetes mellitus with other specified complication: Secondary | ICD-10-CM | POA: Diagnosis not present

## 2023-09-26 DIAGNOSIS — E1122 Type 2 diabetes mellitus with diabetic chronic kidney disease: Secondary | ICD-10-CM | POA: Diagnosis not present

## 2023-09-26 DIAGNOSIS — I7121 Aneurysm of the ascending aorta, without rupture: Secondary | ICD-10-CM | POA: Diagnosis not present

## 2023-09-26 DIAGNOSIS — L89312 Pressure ulcer of right buttock, stage 2: Secondary | ICD-10-CM | POA: Diagnosis not present

## 2023-09-26 DIAGNOSIS — N1831 Chronic kidney disease, stage 3a: Secondary | ICD-10-CM | POA: Diagnosis not present

## 2023-09-27 ENCOUNTER — Telehealth: Payer: Self-pay | Admitting: Cardiology

## 2023-09-27 DIAGNOSIS — I5033 Acute on chronic diastolic (congestive) heart failure: Secondary | ICD-10-CM | POA: Diagnosis not present

## 2023-09-27 DIAGNOSIS — E1122 Type 2 diabetes mellitus with diabetic chronic kidney disease: Secondary | ICD-10-CM | POA: Diagnosis not present

## 2023-09-27 DIAGNOSIS — N1831 Chronic kidney disease, stage 3a: Secondary | ICD-10-CM | POA: Diagnosis not present

## 2023-09-27 DIAGNOSIS — Z79899 Other long term (current) drug therapy: Secondary | ICD-10-CM

## 2023-09-27 DIAGNOSIS — I110 Hypertensive heart disease with heart failure: Secondary | ICD-10-CM | POA: Diagnosis not present

## 2023-09-27 DIAGNOSIS — M25471 Effusion, right ankle: Secondary | ICD-10-CM

## 2023-09-27 DIAGNOSIS — E785 Hyperlipidemia, unspecified: Secondary | ICD-10-CM | POA: Diagnosis not present

## 2023-09-27 DIAGNOSIS — I7121 Aneurysm of the ascending aorta, without rupture: Secondary | ICD-10-CM | POA: Diagnosis not present

## 2023-09-27 DIAGNOSIS — L89312 Pressure ulcer of right buttock, stage 2: Secondary | ICD-10-CM | POA: Diagnosis not present

## 2023-09-27 DIAGNOSIS — I480 Paroxysmal atrial fibrillation: Secondary | ICD-10-CM | POA: Diagnosis not present

## 2023-09-27 DIAGNOSIS — E1169 Type 2 diabetes mellitus with other specified complication: Secondary | ICD-10-CM | POA: Diagnosis not present

## 2023-09-27 NOTE — Telephone Encounter (Signed)
 Returned call to [employer] at [facility]. Sherri reports that patient has gained 7 lbs in one week and legs are swollen. Patient was just seen by Dr. Shlomo on 09/20/23, where chronic leg edema was documented. Bmet done that day had elevated Cr. Per Maeola, patient is not short of breath at this time but has been eating potato chips and salty food. Patient is currently on 40 mg lasix  daily and 25 mg spironolactone  daily.  Advised that patient would likely need a BMET before meds could be adjusted, Sherri states she will ask patient's PCP at [facility] to at least run labs and forward to us . Patient's PCP at facility is Powell Mania NP. Information forwarded to Dr. Shlomo.

## 2023-09-27 NOTE — Telephone Encounter (Signed)
 Pt c/o swelling/edema: STAT if pt has developed SOB within 24 hours  If swelling, where is the swelling located? Legs   How much weight have you gained and in what time span? 7 lbs in a week   Have you gained 2 pounds in a day or 5 pounds in a week? 7 lbs in a week   Do you have a log of your daily weights (if so, list)? 167 - today   Are you currently taking a fluid pill? Yes   Are you currently SOB? No   Have you traveled recently in a car or plane for an extended period of time? No    Jeffie Bright with pt nursing home called to report that pt gained 7 lbs in a week. She states her VM is confidential so you can leave a VM

## 2023-09-28 ENCOUNTER — Other Ambulatory Visit: Payer: Self-pay

## 2023-09-28 DIAGNOSIS — I10 Essential (primary) hypertension: Secondary | ICD-10-CM

## 2023-09-28 DIAGNOSIS — I5033 Acute on chronic diastolic (congestive) heart failure: Secondary | ICD-10-CM

## 2023-09-28 DIAGNOSIS — Z79899 Other long term (current) drug therapy: Secondary | ICD-10-CM

## 2023-09-28 NOTE — Telephone Encounter (Signed)
 Faxed stat order for BMET to Maeola Bright at fax # [phone].

## 2023-09-29 DIAGNOSIS — R609 Edema, unspecified: Secondary | ICD-10-CM | POA: Diagnosis not present

## 2023-09-29 DIAGNOSIS — Z79899 Other long term (current) drug therapy: Secondary | ICD-10-CM | POA: Diagnosis not present

## 2023-09-29 DIAGNOSIS — I10 Essential (primary) hypertension: Secondary | ICD-10-CM | POA: Diagnosis not present

## 2023-09-29 DIAGNOSIS — I5033 Acute on chronic diastolic (congestive) heart failure: Secondary | ICD-10-CM | POA: Diagnosis not present

## 2023-10-02 DIAGNOSIS — F4321 Adjustment disorder with depressed mood: Secondary | ICD-10-CM | POA: Diagnosis not present

## 2023-10-04 DIAGNOSIS — E785 Hyperlipidemia, unspecified: Secondary | ICD-10-CM | POA: Diagnosis not present

## 2023-10-04 DIAGNOSIS — N1831 Chronic kidney disease, stage 3a: Secondary | ICD-10-CM | POA: Diagnosis not present

## 2023-10-04 DIAGNOSIS — L89312 Pressure ulcer of right buttock, stage 2: Secondary | ICD-10-CM | POA: Diagnosis not present

## 2023-10-04 DIAGNOSIS — I110 Hypertensive heart disease with heart failure: Secondary | ICD-10-CM | POA: Diagnosis not present

## 2023-10-04 DIAGNOSIS — E1169 Type 2 diabetes mellitus with other specified complication: Secondary | ICD-10-CM | POA: Diagnosis not present

## 2023-10-04 DIAGNOSIS — I5033 Acute on chronic diastolic (congestive) heart failure: Secondary | ICD-10-CM | POA: Diagnosis not present

## 2023-10-04 DIAGNOSIS — I480 Paroxysmal atrial fibrillation: Secondary | ICD-10-CM | POA: Diagnosis not present

## 2023-10-04 DIAGNOSIS — E1122 Type 2 diabetes mellitus with diabetic chronic kidney disease: Secondary | ICD-10-CM | POA: Diagnosis not present

## 2023-10-04 DIAGNOSIS — I7121 Aneurysm of the ascending aorta, without rupture: Secondary | ICD-10-CM | POA: Diagnosis not present

## 2023-10-04 LAB — LAB REPORT - SCANNED: EGFR: 39.7

## 2023-10-05 DIAGNOSIS — E1122 Type 2 diabetes mellitus with diabetic chronic kidney disease: Secondary | ICD-10-CM | POA: Diagnosis not present

## 2023-10-05 DIAGNOSIS — E1169 Type 2 diabetes mellitus with other specified complication: Secondary | ICD-10-CM | POA: Diagnosis not present

## 2023-10-05 DIAGNOSIS — I5033 Acute on chronic diastolic (congestive) heart failure: Secondary | ICD-10-CM | POA: Diagnosis not present

## 2023-10-05 DIAGNOSIS — I110 Hypertensive heart disease with heart failure: Secondary | ICD-10-CM | POA: Diagnosis not present

## 2023-10-05 DIAGNOSIS — E785 Hyperlipidemia, unspecified: Secondary | ICD-10-CM | POA: Diagnosis not present

## 2023-10-05 DIAGNOSIS — I7121 Aneurysm of the ascending aorta, without rupture: Secondary | ICD-10-CM | POA: Diagnosis not present

## 2023-10-05 DIAGNOSIS — I480 Paroxysmal atrial fibrillation: Secondary | ICD-10-CM | POA: Diagnosis not present

## 2023-10-05 DIAGNOSIS — N1831 Chronic kidney disease, stage 3a: Secondary | ICD-10-CM | POA: Diagnosis not present

## 2023-10-05 DIAGNOSIS — L89312 Pressure ulcer of right buttock, stage 2: Secondary | ICD-10-CM | POA: Diagnosis not present

## 2023-10-05 NOTE — Telephone Encounter (Signed)
 Call to [name] at [phone] to ask for BMET results to be faxed to us  at [phone]. No answer, left voice mail asking for call back.

## 2023-10-11 DIAGNOSIS — I480 Paroxysmal atrial fibrillation: Secondary | ICD-10-CM | POA: Diagnosis not present

## 2023-10-11 DIAGNOSIS — E1169 Type 2 diabetes mellitus with other specified complication: Secondary | ICD-10-CM | POA: Diagnosis not present

## 2023-10-11 DIAGNOSIS — N1831 Chronic kidney disease, stage 3a: Secondary | ICD-10-CM | POA: Diagnosis not present

## 2023-10-11 DIAGNOSIS — E785 Hyperlipidemia, unspecified: Secondary | ICD-10-CM | POA: Diagnosis not present

## 2023-10-11 DIAGNOSIS — I7121 Aneurysm of the ascending aorta, without rupture: Secondary | ICD-10-CM | POA: Diagnosis not present

## 2023-10-11 DIAGNOSIS — I5033 Acute on chronic diastolic (congestive) heart failure: Secondary | ICD-10-CM | POA: Diagnosis not present

## 2023-10-11 DIAGNOSIS — L89312 Pressure ulcer of right buttock, stage 2: Secondary | ICD-10-CM | POA: Diagnosis not present

## 2023-10-11 DIAGNOSIS — E1122 Type 2 diabetes mellitus with diabetic chronic kidney disease: Secondary | ICD-10-CM | POA: Diagnosis not present

## 2023-10-11 DIAGNOSIS — I110 Hypertensive heart disease with heart failure: Secondary | ICD-10-CM | POA: Diagnosis not present

## 2023-10-11 NOTE — Addendum Note (Signed)
 Addended by: JANIT GENI CROME on: 10/11/2023 05:51 PM   Modules accepted: Orders

## 2023-10-11 NOTE — Telephone Encounter (Signed)
 Call to Maeola Bright at [place], [place] that per Dr. Shlomo Serum creatinine 1.29 from nursing facility on 9/4. Sherri reports pateint's leg swelling is improved but still present, she also reports patient is walking with less shortness of breath. Dr. Shlomo advises to increase Lasix  to 40 mg twice daily x 3 days then back to 40 mg daily. Sherri verbalizes understanding that if weight does not go back to baseline then she needs to call and accepts verbal order to get a BMP in 1 week. Printed lab order for MD signature.

## 2023-10-12 DIAGNOSIS — I7121 Aneurysm of the ascending aorta, without rupture: Secondary | ICD-10-CM | POA: Diagnosis not present

## 2023-10-12 DIAGNOSIS — I480 Paroxysmal atrial fibrillation: Secondary | ICD-10-CM | POA: Diagnosis not present

## 2023-10-12 DIAGNOSIS — E785 Hyperlipidemia, unspecified: Secondary | ICD-10-CM | POA: Diagnosis not present

## 2023-10-12 DIAGNOSIS — L89312 Pressure ulcer of right buttock, stage 2: Secondary | ICD-10-CM | POA: Diagnosis not present

## 2023-10-12 DIAGNOSIS — N1831 Chronic kidney disease, stage 3a: Secondary | ICD-10-CM | POA: Diagnosis not present

## 2023-10-12 DIAGNOSIS — E1169 Type 2 diabetes mellitus with other specified complication: Secondary | ICD-10-CM | POA: Diagnosis not present

## 2023-10-12 DIAGNOSIS — I5033 Acute on chronic diastolic (congestive) heart failure: Secondary | ICD-10-CM | POA: Diagnosis not present

## 2023-10-12 DIAGNOSIS — E1122 Type 2 diabetes mellitus with diabetic chronic kidney disease: Secondary | ICD-10-CM | POA: Diagnosis not present

## 2023-10-12 DIAGNOSIS — I110 Hypertensive heart disease with heart failure: Secondary | ICD-10-CM | POA: Diagnosis not present

## 2023-10-16 DIAGNOSIS — R609 Edema, unspecified: Secondary | ICD-10-CM | POA: Diagnosis not present

## 2023-10-16 DIAGNOSIS — E119 Type 2 diabetes mellitus without complications: Secondary | ICD-10-CM | POA: Diagnosis not present

## 2023-10-16 DIAGNOSIS — F4321 Adjustment disorder with depressed mood: Secondary | ICD-10-CM | POA: Diagnosis not present

## 2023-10-16 DIAGNOSIS — I5032 Chronic diastolic (congestive) heart failure: Secondary | ICD-10-CM | POA: Diagnosis not present

## 2023-10-17 DIAGNOSIS — F4321 Adjustment disorder with depressed mood: Secondary | ICD-10-CM | POA: Diagnosis not present

## 2023-10-19 DIAGNOSIS — G4700 Insomnia, unspecified: Secondary | ICD-10-CM | POA: Diagnosis not present

## 2023-10-19 DIAGNOSIS — F4321 Adjustment disorder with depressed mood: Secondary | ICD-10-CM | POA: Diagnosis not present

## 2023-10-19 DIAGNOSIS — Z79899 Other long term (current) drug therapy: Secondary | ICD-10-CM | POA: Diagnosis not present

## 2023-10-19 DIAGNOSIS — F411 Generalized anxiety disorder: Secondary | ICD-10-CM | POA: Diagnosis not present

## 2023-10-19 LAB — BASIC METABOLIC PANEL (CC13): EGFR: 55.2

## 2023-10-25 ENCOUNTER — Telehealth: Payer: Self-pay

## 2023-10-25 NOTE — Telephone Encounter (Signed)
-----   Message from Wilbert Bihari sent at 10/13/2023  7:52 PM EDT ----- Please call them back to find out if weight is stable and LE edema stable on current doses of diuretics ----- Message ----- From: Janit Geni CROME, RN Sent: 10/13/2023   5:14 PM EDT To: Wilbert JONELLE Bihari, MD  It's super confusing but there's a note from 10/11/23 but it's in the encounter from 09/27/23:  Call to Maeola Bright at [place], [place] that per Dr. Bihari Serum creatinine 1.29 from nursing facility on 9/4. Sherri reports pateint's leg swelling is improved but still present, she also reports patient is walking with less shortness of breath. Dr. Bihari advises to increase Lasix  to 40 mg twice daily x 3 days then back to 40 mg daily. Sherri verbalizes understanding that if weight does not go back to baseline then she needs to call and accepts verbal order to get a BMP in 1 week. Printed lab order for MD signature.   I gave verbal orders and have the lab order on your desk for signature on Monday. E ----- Message ----- From: Bihari Wilbert JONELLE, MD Sent: 10/11/2023  12:29 PM EDT To: Geni CROME Janit, RN  Labs stable - please find out how her LE edema is doing ----- Message ----- From: Janit Geni CROME, RN Sent: 10/11/2023  11:09 AM EDT To: Wilbert JONELLE Bihari, MD  If it's not showing up in the attachment, the labs are also under media and lab tabs for encounter date 10/04/23. ----- Message ----- From: Bihari Wilbert JONELLE, MD Sent: 10/09/2023   4:00 PM EDT To: Geni CROME Janit, RN  Nothing attached ----- Message ----- From: Janit Geni CROME, RN Sent: 10/09/2023   9:19 AM EDT To: Wilbert JONELLE Bihari, MD  Patient with lasix  dose adjustments, BMET results sent from [facility].

## 2023-10-25 NOTE — Telephone Encounter (Signed)
 Call to RN Maeola Bright at [place], who reports that she will fax recent BMET results to our office today. [name] reports that patient's legs look better. She reports weight is stable at 167, patient has not gained any more weight and leg swelling is improved.

## 2023-10-26 DIAGNOSIS — I5032 Chronic diastolic (congestive) heart failure: Secondary | ICD-10-CM | POA: Diagnosis not present

## 2023-10-26 DIAGNOSIS — I110 Hypertensive heart disease with heart failure: Secondary | ICD-10-CM | POA: Diagnosis not present

## 2023-10-26 DIAGNOSIS — E119 Type 2 diabetes mellitus without complications: Secondary | ICD-10-CM | POA: Diagnosis not present

## 2023-10-30 DIAGNOSIS — F4321 Adjustment disorder with depressed mood: Secondary | ICD-10-CM | POA: Diagnosis not present

## 2023-10-30 DIAGNOSIS — I5033 Acute on chronic diastolic (congestive) heart failure: Secondary | ICD-10-CM | POA: Diagnosis not present

## 2023-10-30 DIAGNOSIS — E119 Type 2 diabetes mellitus without complications: Secondary | ICD-10-CM | POA: Diagnosis not present

## 2023-11-07 ENCOUNTER — Telehealth: Payer: Self-pay | Admitting: Cardiology

## 2023-11-07 NOTE — Telephone Encounter (Signed)
 Pt c/o swelling/edema: STAT if pt has developed SOB within 24 hours  If swelling, where is the swelling located? Legs - pitting edema in both lower legs  How much weight have you gained and in what time span?  Yes  Have you gained 2 pounds in a day or 5 pounds in a week?  5 pounds in a week  Do you have a log of your daily weights (if so, list)?  Yes  Are you currently taking a fluid pill?   Yes  Are you currently SOB?   No  Have you traveled recently in a car or plane for an extended period of time?   No  Caller (Sherri) reporting patient is having pitting edema in both legs.  Caller noted patient now weight 172 pounds.  Caller stated patient has not been walking very much.

## 2023-11-07 NOTE — Telephone Encounter (Signed)
 Returned [facility] at [employer]. Joen reports patient's legs are swollen and she has gained weight. She reports patient does not move much but is not SOB. She is away from her desk and cannot give me HR and BP readings. Made appt for patient with A. Duke NP for 11/10/23, advised Joen to call 911 or take patient to ED if SOB or chest pain develop.

## 2023-11-09 DIAGNOSIS — E119 Type 2 diabetes mellitus without complications: Secondary | ICD-10-CM | POA: Diagnosis not present

## 2023-11-09 DIAGNOSIS — R42 Dizziness and giddiness: Secondary | ICD-10-CM | POA: Diagnosis not present

## 2023-11-09 DIAGNOSIS — I5032 Chronic diastolic (congestive) heart failure: Secondary | ICD-10-CM | POA: Diagnosis not present

## 2023-11-09 DIAGNOSIS — R600 Localized edema: Secondary | ICD-10-CM | POA: Diagnosis not present

## 2023-11-09 NOTE — Progress Notes (Unsigned)
 Cardiology Office Note:    Date:  11/09/2023   ID:  [name], DOB Mar 04, 1932, MRN [id number]  PCP:  [facility]   [facility] Providers Cardiologist:  Wilbert Bihari, MD { Click to update primary MD,subspecialty MD or APP then REFRESH:1}    Referring MD: [facility]   No chief complaint on file. ***  History of Present Illness:    [name] is a 89 y.o. female with a hx of thoracic aortic aneurysm 46 mm on CT 12/2021, HFpEF, PAF on amiodarone  and Eliquis , hypertension, hyperlipidemia, DM, PUD, cerebral aneurysm s/p coiling, question history of TIA, mild pulmonary hypertension.  Echocardiogram with mild MR.  Last echocardiogram 2023.  She had a cardiac catheterization 2002 that showed normal coronaries.  She had a low risk Myoview 2017.  She was admitted 12/2021 with A-fib with RVR and elevated troponin felt likely demand ischemia.  She declined LHC and opted for OP ischemic evaluation.  She spontaneously converted to NSR and Eliquis  was started.  She was admitted 01/2022 with gastroenteritis and recurrent A-fib with RVR as well as acute on chronic HFpEF.  She was started on amiodarone .  Heart monitor 08/2022 showed sinus bradycardia and 8 runs of SVT/atrial tachycardia.  She was last seen by Dr. 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  She presents for routine follow up.     HFpEF - last echo with mildly elevated PASP with RVSP 44.0 mmHg - preserved LVEF 65-70%, moderate BAE, mild MR, moderate AV calcifications, mild to moderate TR - maintained on losartan , BB, 25 mg spironolactone , and 20 mg lasix  daily - appears ___   Ascending aortic aneurysm - 41 mm on echo 07/2021 --> stable on echo 06/2023 - given stability, will not repeat until 2026   PAF  Chronic anticoagulation - continue 200 mg amiodarone , 25 mg lopressor  BID, eliquis  5 mg BID   Hypertension - continue 25 mg  lopressor  BID, 25 mg losartan  daily, 10 mg hydralazine  TID, 20 mg lasix , 25 mg spironolactone  - need BMP?   IDDM - on insulin  at home       Past Medical History:  Diagnosis Date   Abdominal pain, chronic, left lower quadrant    Acute torn meniscus of knee    Adenomatous colon polyp 1970   carcinoma in situ   Allergic rhinitis    Amaurosis fugax 08/07/2012   Anxiety    Ascending aortic aneurysm 12/07/2015   44mm by chest CTA 08/2020   Benign essential tremor syndrome    Bicuspid aortic valve    no AS on 07/2019   Carotid stenosis    1039 bilateral by dopplers 03/2017.    colon ca dx'd 1970   surg only   Coronary artery calcification seen on CT scan 09/26/2022   - Cath in 2002 normal - Myoview in 2017 low risk - Admx in 12/2021 w AF w RVR and elevated hsT c/w NSTEMI - pt preferred conservative mgmt (no cath)    DDD (degenerative disc disease)    Diverticulitis    Diverticulosis    DM (diabetes mellitus) (HCC)    Duodenitis    peptic, with gastric heterotopia   Endometriosis    s/p hysterectomy   Fundic gland polyposis of stomach    GERD (gastroesophageal reflux disease)    Heart murmur    Hiatal hernia 02/03/2005   History of cerebral aneurysm repair    s/p coiling   History  of hemorrhoids    with bleeding   History of shingles    HLD (hyperlipidemia)    HTN (hypertension)    Hypercholesteremia    IBS (irritable bowel syndrome)    Iron deficiency    Lung nodule 07/12/2021   CT 05/2021: 3 mm right solid pulmonary nodule-no routine follow-up imaging recommended   Migraine    MVP (mitral valve prolapse)    Osteopenia    Peripheral neuropathy    Toe fracture, right    second toe   UTI (lower urinary tract infection)    Varicose vein     Past Surgical History:  Procedure Laterality Date   ABDOMINAL HYSTERECTOMY     APPENDECTOMY     CARPAL TUNNEL RELEASE  08/26/07   CATARACT EXTRACTION     CEREBRAL ANEURYSM REPAIR     COLON RESECTION     COLONOSCOPY   06/07/08   divertiulosis, internal hemorrhoids   COLONOSCOPY W/ BIOPSIES AND POLYPECTOMY  02/03/05   diverticulosis, 4 mm sessile polyps, internal and external hemorrhoids   CORONARY ANGIOPLASTY WITH STENT PLACEMENT     ESOPHAGOGASTRODUODENOSCOPY  02/03/05   hiatal hernia, 6 benign gastric polyps   FLEXIBLE SIGMOIDOSCOPY     HAND SURGERY Right    INTRAOCULAR LENS INSERTION     right hand decompressive fasciotomy  08/26/07   , dorsal and volar   TONSILLECTOMY      Current Medications: No outpatient medications have been marked as taking for the 11/10/23 encounter (Appointment) with Madie Jon Garre, PA.     Allergies:   Amoxicillin , Fosamax [alendronate sodium], Ms contin  [morphine ], Pioglitazone, Rosiglitazone, Ultram  [tramadol ], Atorvastatin, Glimepiride, Linagliptin, Metformin and related, Repaglinide, Sitagliptin, Zoloft [sertraline hcl], Aspirin , Bentyl [dicyclomine hcl], Cephalexin, Cipro  [ciprofloxacin  hcl], Ciprofloxacin , Dicyclomine, Hctz [hydrochlorothiazide ], Morphine  and codeine, Norvasc  [amlodipine  besylate], Oxycontin [oxycodone], Penicillin g, Sulfa antibiotics, Penicillins, and Sulfamethoxazole-trimethoprim   Social History   Socioeconomic History   Marital status: Married    Spouse name: Charles   Number of children: 0   Years of education: Not on file   Highest education level: Not on file  Occupational History   Occupation: retired    Associate Professor: RETIRED  Tobacco Use   Smoking status: Never   Smokeless tobacco: Never  Vaping Use   Vaping status: Never Used  Substance and Sexual Activity   Alcohol use: No   Drug use: No   Sexual activity: Not Currently  Other Topics Concern   Not on file  Social History Narrative   Lives with husband    Right handed   Caffeine: 2 c of coffee a day   Social Drivers of Corporate investment banker Strain: Not on file  Food Insecurity: No Food Insecurity (07/13/2023)   Hunger Vital Sign    Worried About Running Out of Food  in the Last Year: Never true    Ran Out of Food in the Last Year: Never true  Transportation Needs: No Transportation Needs (07/13/2023)   PRAPARE - Administrator, Civil Service (Medical): No    Lack of Transportation (Non-Medical): No  Physical Activity: Not on file  Stress: Not on file  Social Connections: Socially Isolated (07/13/2023)   Social Connection and Isolation Panel    Frequency of Communication with Friends and Family: More than three times a week    Frequency of Social Gatherings with Friends and Family: More than three times a week    Attends Religious Services: Never    Production manager of [employer]  or Organizations: No    Attends Banker Meetings: Never    Marital Status: Widowed     Family History: The patient's ***family history includes Colon cancer in an other family member; Diabetes in her brother, brother, paternal grandmother, and another family member; Heart disease in her brother, brother, and father; Hypertension in her brother; Kidney disease in her father; Microcephaly in her brother; Ovarian cancer in her mother.  ROS:   Please see the history of present illness.    *** All other systems reviewed and are negative.  EKGs/Labs/Other Studies Reviewed:    The following studies were reviewed today: ***      Recent Labs: 06/18/2023: ALT 15 06/21/2023: TSH 1.313 07/16/2023: Magnesium  2.4 09/06/2023: B Natriuretic Peptide 252.6; Hemoglobin 11.5; Platelets 301 09/20/2023: BUN 29; Creatinine, Ser 1.29; Potassium 4.6; Sodium 138  Recent Lipid Panel    Component Value Date/Time   CHOL 144 01/02/2022 1056   TRIG 101 01/02/2022 1056   HDL 32 (L) 01/02/2022 1056   CHOLHDL 4.5 01/02/2022 1056   VLDL 20 01/02/2022 1056   LDLCALC 92 01/02/2022 1056     Risk Assessment/Calculations:   {Does this patient have ATRIAL FIBRILLATION?:[phone]}  No BP recorded.  {Refresh Note OR Click here to enter BP  :1}***         Physical Exam:    VS:   There were no vitals taken for this visit.    Wt Readings from Last 3 Encounters:  09/20/23 167 lb 6.4 oz (75.9 kg)  07/19/23 158 lb 1.1 oz (71.7 kg)  06/23/23 163 lb 5.8 oz (74.1 kg)     GEN: *** Well nourished, well developed in no acute distress HEENT: Normal NECK: No JVD; No carotid bruits LYMPHATICS: No lymphadenopathy CARDIAC: ***RRR, no murmurs, rubs, gallops RESPIRATORY:  Clear to auscultation without rales, wheezing or rhonchi  ABDOMEN: Soft, non-tender, non-distended MUSCULOSKELETAL:  No edema; No deformity  SKIN: Warm and dry NEUROLOGIC:  Alert and oriented x 3 PSYCHIATRIC:  Normal affect   ASSESSMENT:    No diagnosis found. PLAN:    In order of problems listed above:  ***      {Are you ordering a CV Procedure (e.g. stress test, cath, DCCV, TEE, etc)?   Press F2        :[id number]}    Medication Adjustments/Labs and Tests Ordered: Current medicines are reviewed at length with the patient today.  Concerns regarding medicines are outlined above.  No orders of the defined types were placed in this encounter.  No orders of the defined types were placed in this encounter.   There are no Patient Instructions on file for this visit.   Signed, Jon Garre Jamecia Lerman, PA  11/09/2023 1:21 PM    [facility]

## 2023-11-10 ENCOUNTER — Ambulatory Visit: Attending: Physician Assistant | Admitting: Physician Assistant

## 2023-11-10 ENCOUNTER — Encounter: Payer: Self-pay | Admitting: Physician Assistant

## 2023-11-10 VITALS — BP 157/52 | HR 61 | Ht 64.5 in | Wt 172.0 lb

## 2023-11-10 DIAGNOSIS — I7121 Aneurysm of the ascending aorta, without rupture: Secondary | ICD-10-CM | POA: Diagnosis not present

## 2023-11-10 DIAGNOSIS — I5033 Acute on chronic diastolic (congestive) heart failure: Secondary | ICD-10-CM

## 2023-11-10 DIAGNOSIS — R2689 Other abnormalities of gait and mobility: Secondary | ICD-10-CM

## 2023-11-10 DIAGNOSIS — Z7901 Long term (current) use of anticoagulants: Secondary | ICD-10-CM | POA: Diagnosis not present

## 2023-11-10 DIAGNOSIS — I951 Orthostatic hypotension: Secondary | ICD-10-CM

## 2023-11-10 DIAGNOSIS — I480 Paroxysmal atrial fibrillation: Secondary | ICD-10-CM

## 2023-11-10 DIAGNOSIS — I10 Essential (primary) hypertension: Secondary | ICD-10-CM | POA: Diagnosis not present

## 2023-11-10 MED ORDER — FUROSEMIDE 20 MG PO TABS: 40.0000 mg | ORAL_TABLET | Freq: Every day | ORAL | 0 refills | Status: AC

## 2023-11-10 NOTE — Patient Instructions (Addendum)
 Medication Instructions:  STOP Lopressor  INCREASE Lasix  to THREE tablets (60 mg) for 5 DAYS, then RESUME TWO tablets (40 mg) daily, may take an extra ONE (20 mg) tablet for weight gain of MORE THAN 3 lbs. Overnight. *If you need a refill on your cardiac medications before your next appointment, please call your pharmacy*  Lab Work: None If you have labs (blood work) drawn today and your tests are completely normal, you will receive your results only by: MyChart Message (if you have MyChart) OR A paper copy in the mail If you have any lab test that is abnormal or we need to change your treatment, we will call you to review the results.  Testing/Procedures: None  Follow-Up: At [facility], you and your health needs are our priority.  As part of our continuing mission to provide you with exceptional heart care, our providers are all part of one team.  This team includes your primary Cardiologist (physician) and Advanced Practice Providers or APPs (Physician Assistants and Nurse Practitioners) who all work together to provide you with the care you need, when you need it.  Your next appointment:   3-4 week(s)  Provider:   Jon Hails, PA  We recommend signing up for the patient portal called MyChart.  Sign up information is provided on this After Visit Summary.  MyChart is used to connect with patients for Virtual Visits (Telemedicine).  Patients are able to view lab/test results, encounter notes, upcoming appointments, etc.  Non-urgent messages can be sent to your provider as well.   To learn more about what you can do with MyChart, go to [email or web address].   Other Instructions Monitor Daily weights in the morning and keep log that was provided.  Monitor BP between 3 and 4 pm and keep log that was provided.  Apply side zip TED hose during the day and remove at night prior to bed.  Fluid restriction of 32 oz of fluids Daily.

## 2023-11-13 DIAGNOSIS — I5032 Chronic diastolic (congestive) heart failure: Secondary | ICD-10-CM | POA: Diagnosis not present

## 2023-11-13 DIAGNOSIS — R600 Localized edema: Secondary | ICD-10-CM | POA: Diagnosis not present

## 2023-11-13 DIAGNOSIS — E119 Type 2 diabetes mellitus without complications: Secondary | ICD-10-CM | POA: Diagnosis not present

## 2023-11-13 DIAGNOSIS — Z7901 Long term (current) use of anticoagulants: Secondary | ICD-10-CM | POA: Diagnosis not present

## 2023-11-13 DIAGNOSIS — I4891 Unspecified atrial fibrillation: Secondary | ICD-10-CM | POA: Diagnosis not present

## 2023-11-14 DIAGNOSIS — R2681 Unsteadiness on feet: Secondary | ICD-10-CM | POA: Diagnosis not present

## 2023-11-14 DIAGNOSIS — M150 Primary generalized (osteo)arthritis: Secondary | ICD-10-CM | POA: Diagnosis not present

## 2023-11-14 DIAGNOSIS — I5041 Acute combined systolic (congestive) and diastolic (congestive) heart failure: Secondary | ICD-10-CM | POA: Diagnosis not present

## 2023-11-16 DIAGNOSIS — F4321 Adjustment disorder with depressed mood: Secondary | ICD-10-CM | POA: Diagnosis not present

## 2023-11-16 DIAGNOSIS — F411 Generalized anxiety disorder: Secondary | ICD-10-CM | POA: Diagnosis not present

## 2023-11-16 DIAGNOSIS — G4700 Insomnia, unspecified: Secondary | ICD-10-CM | POA: Diagnosis not present

## 2023-11-21 DIAGNOSIS — F4321 Adjustment disorder with depressed mood: Secondary | ICD-10-CM | POA: Diagnosis not present

## 2023-11-22 DIAGNOSIS — M150 Primary generalized (osteo)arthritis: Secondary | ICD-10-CM | POA: Diagnosis not present

## 2023-11-22 DIAGNOSIS — I5041 Acute combined systolic (congestive) and diastolic (congestive) heart failure: Secondary | ICD-10-CM | POA: Diagnosis not present

## 2023-11-22 DIAGNOSIS — R2681 Unsteadiness on feet: Secondary | ICD-10-CM | POA: Diagnosis not present

## 2023-11-23 DIAGNOSIS — I5032 Chronic diastolic (congestive) heart failure: Secondary | ICD-10-CM | POA: Diagnosis not present

## 2023-11-23 DIAGNOSIS — N1831 Chronic kidney disease, stage 3a: Secondary | ICD-10-CM | POA: Diagnosis not present

## 2023-11-23 DIAGNOSIS — E119 Type 2 diabetes mellitus without complications: Secondary | ICD-10-CM | POA: Diagnosis not present

## 2023-11-23 DIAGNOSIS — I110 Hypertensive heart disease with heart failure: Secondary | ICD-10-CM | POA: Diagnosis not present

## 2023-11-25 DIAGNOSIS — I5033 Acute on chronic diastolic (congestive) heart failure: Secondary | ICD-10-CM | POA: Diagnosis not present

## 2023-11-25 DIAGNOSIS — E119 Type 2 diabetes mellitus without complications: Secondary | ICD-10-CM | POA: Diagnosis not present

## 2023-11-27 DIAGNOSIS — F4321 Adjustment disorder with depressed mood: Secondary | ICD-10-CM | POA: Diagnosis not present

## 2023-11-29 DIAGNOSIS — R2681 Unsteadiness on feet: Secondary | ICD-10-CM | POA: Diagnosis not present

## 2023-11-29 DIAGNOSIS — I5041 Acute combined systolic (congestive) and diastolic (congestive) heart failure: Secondary | ICD-10-CM | POA: Diagnosis not present

## 2023-11-29 DIAGNOSIS — M150 Primary generalized (osteo)arthritis: Secondary | ICD-10-CM | POA: Diagnosis not present

## 2023-12-06 DIAGNOSIS — R2681 Unsteadiness on feet: Secondary | ICD-10-CM | POA: Diagnosis not present

## 2023-12-06 DIAGNOSIS — I5041 Acute combined systolic (congestive) and diastolic (congestive) heart failure: Secondary | ICD-10-CM | POA: Diagnosis not present

## 2023-12-06 DIAGNOSIS — M150 Primary generalized (osteo)arthritis: Secondary | ICD-10-CM | POA: Diagnosis not present

## 2023-12-08 ENCOUNTER — Ambulatory Visit: Admitting: Physician Assistant

## 2023-12-11 ENCOUNTER — Ambulatory Visit: Admitting: Podiatrist

## 2023-12-11 DIAGNOSIS — E559 Vitamin D deficiency, unspecified: Secondary | ICD-10-CM | POA: Diagnosis not present

## 2023-12-11 DIAGNOSIS — F4321 Adjustment disorder with depressed mood: Secondary | ICD-10-CM | POA: Diagnosis not present

## 2023-12-11 DIAGNOSIS — N1831 Chronic kidney disease, stage 3a: Secondary | ICD-10-CM | POA: Diagnosis not present

## 2023-12-11 DIAGNOSIS — I130 Hypertensive heart and chronic kidney disease with heart failure and stage 1 through stage 4 chronic kidney disease, or unspecified chronic kidney disease: Secondary | ICD-10-CM | POA: Diagnosis not present

## 2023-12-11 DIAGNOSIS — F411 Generalized anxiety disorder: Secondary | ICD-10-CM | POA: Diagnosis not present

## 2023-12-11 DIAGNOSIS — E1122 Type 2 diabetes mellitus with diabetic chronic kidney disease: Secondary | ICD-10-CM | POA: Diagnosis not present

## 2023-12-14 DIAGNOSIS — G4700 Insomnia, unspecified: Secondary | ICD-10-CM | POA: Diagnosis not present

## 2023-12-14 DIAGNOSIS — F4321 Adjustment disorder with depressed mood: Secondary | ICD-10-CM | POA: Diagnosis not present

## 2023-12-14 DIAGNOSIS — F411 Generalized anxiety disorder: Secondary | ICD-10-CM | POA: Diagnosis not present

## 2023-12-15 DIAGNOSIS — M150 Primary generalized (osteo)arthritis: Secondary | ICD-10-CM | POA: Diagnosis not present

## 2023-12-15 DIAGNOSIS — I5041 Acute combined systolic (congestive) and diastolic (congestive) heart failure: Secondary | ICD-10-CM | POA: Diagnosis not present

## 2023-12-15 DIAGNOSIS — R2681 Unsteadiness on feet: Secondary | ICD-10-CM | POA: Diagnosis not present

## 2023-12-15 DIAGNOSIS — M6281 Muscle weakness (generalized): Secondary | ICD-10-CM | POA: Diagnosis not present

## 2023-12-17 DIAGNOSIS — M6281 Muscle weakness (generalized): Secondary | ICD-10-CM | POA: Diagnosis not present

## 2023-12-17 DIAGNOSIS — M150 Primary generalized (osteo)arthritis: Secondary | ICD-10-CM | POA: Diagnosis not present

## 2023-12-17 DIAGNOSIS — I5041 Acute combined systolic (congestive) and diastolic (congestive) heart failure: Secondary | ICD-10-CM | POA: Diagnosis not present

## 2023-12-17 DIAGNOSIS — R2681 Unsteadiness on feet: Secondary | ICD-10-CM | POA: Diagnosis not present

## 2023-12-18 ENCOUNTER — Ambulatory Visit: Admitting: Physician Assistant

## 2023-12-20 ENCOUNTER — Ambulatory Visit: Admitting: Cardiology

## 2023-12-21 ENCOUNTER — Ambulatory Visit: Admitting: Cardiology

## 2023-12-27 DIAGNOSIS — F411 Generalized anxiety disorder: Secondary | ICD-10-CM | POA: Diagnosis not present

## 2024-01-02 DIAGNOSIS — I5041 Acute combined systolic (congestive) and diastolic (congestive) heart failure: Secondary | ICD-10-CM | POA: Diagnosis not present

## 2024-01-02 DIAGNOSIS — R2681 Unsteadiness on feet: Secondary | ICD-10-CM | POA: Diagnosis not present

## 2024-01-02 DIAGNOSIS — M6281 Muscle weakness (generalized): Secondary | ICD-10-CM | POA: Diagnosis not present

## 2024-01-02 DIAGNOSIS — M150 Primary generalized (osteo)arthritis: Secondary | ICD-10-CM | POA: Diagnosis not present

## 2024-01-08 DIAGNOSIS — I4891 Unspecified atrial fibrillation: Secondary | ICD-10-CM | POA: Diagnosis not present

## 2024-01-08 DIAGNOSIS — R600 Localized edema: Secondary | ICD-10-CM | POA: Diagnosis not present

## 2024-01-08 DIAGNOSIS — F4321 Adjustment disorder with depressed mood: Secondary | ICD-10-CM | POA: Diagnosis not present

## 2024-01-08 DIAGNOSIS — F411 Generalized anxiety disorder: Secondary | ICD-10-CM | POA: Diagnosis not present

## 2024-01-08 DIAGNOSIS — Z7901 Long term (current) use of anticoagulants: Secondary | ICD-10-CM | POA: Diagnosis not present

## 2024-01-08 DIAGNOSIS — I5032 Chronic diastolic (congestive) heart failure: Secondary | ICD-10-CM | POA: Diagnosis not present

## 2024-01-09 DIAGNOSIS — I5041 Acute combined systolic (congestive) and diastolic (congestive) heart failure: Secondary | ICD-10-CM | POA: Diagnosis not present

## 2024-01-09 DIAGNOSIS — M150 Primary generalized (osteo)arthritis: Secondary | ICD-10-CM | POA: Diagnosis not present

## 2024-01-09 DIAGNOSIS — M6281 Muscle weakness (generalized): Secondary | ICD-10-CM | POA: Diagnosis not present

## 2024-01-09 DIAGNOSIS — R2681 Unsteadiness on feet: Secondary | ICD-10-CM | POA: Diagnosis not present

## 2024-01-09 NOTE — Progress Notes (Deleted)
 Cardiology Office Note:    Date:  01/09/2024   ID:  [name], DOB Mar 24, 1932, MRN [id number]  PCP[facility]  [facility]   [facility] Providers Cardiologist:  Wilbert Bihari, MD Cardiology APP:  Madie Jon Garre, PA { Click to update primary MD,subspecialty MD or APP then REFRESH:1}    Referring MD: [facility]   No chief complaint on file. ***  History of Present Illness:    [name] is a 89 y.o. female with a hx of thoracic aortic aneurysm 46 mm on CT 12/2021, HFpEF, PAF on amiodarone  and Eliquis , hypertension, hyperlipidemia, DM, PUD, cerebral aneurysm s/p coiling, question history of TIA, mild pulmonary hypertension.  Echocardiogram with mild MR.  Last echocardiogram 2023.  She had a cardiac catheterization 2002 that showed normal coronaries.  She had a low risk Myoview 2017.  She was admitted 12/2021 with A-fib with RVR and elevated troponin felt likely demand ischemia.  She declined LHC and opted for OP ischemic evaluation.  She spontaneously converted to NSR and Eliquis  was started.  She was admitted 01/2022 with gastroenteritis and recurrent A-fib with RVR as well as acute on chronic HFpEF.  She was started on amiodarone .  Heart monitor 08/2022 showed sinus bradycardia and 8 runs of SVT/atrial tachycardia.  She was last seen by Dr. Bihari 09/20/2023 and was the primary caregiver for her 68 year old husband with dementia.  I saw her for follow-up on 11/10/2023 she resided at [place] at that time.  She reported lower extremity swelling, DOE and a 6 pound weight gain over the past month.  She has been maintained on 40 mg Lasix  daily 25 mg spironolactone .  I opted to increase Lasix  to 60 mg daily x 5 days then resume 40 mg daily.  I stopped the Lopressor  to allow BP room.  I recommended resumption of PT and TED hose with side zippers.  He presents back today for cardiology follow-up.    HFpEF - last echo with mildly elevated PASP with  RVSP 44.0 mmHg - preserved LVEF 65-70%, moderate BAE, mild MR, moderate AV calcifications, mild to moderate TR - maintained on losartan , 25 mg spironolactone , and 40 mg lasix  daily    Ascending aortic aneurysm - 41 mm on echo 07/2021 --> stable on echo 06/2023 - given stability, will not repeat until 2026   PAF  Chronic anticoagulation - continue 200 mg amiodarone , eliquis  5 mg BID -I stopped beta-blocker at last visit   Hypertension - continue 25 mg losartan  daily, 10 mg hydralazine  TID, 40 mg lasix , 25 mg spironolactone  - need BMP?   IDDM - on insulin  at home        Past Medical History:  Diagnosis Date   Abdominal pain, chronic, left lower quadrant    Acute torn meniscus of knee    Adenomatous colon polyp 1970   carcinoma in situ   Allergic rhinitis    Amaurosis fugax 08/07/2012   Anxiety    Ascending aortic aneurysm 12/07/2015   44mm by chest CTA 08/2020   Benign essential tremor syndrome    Bicuspid aortic valve    no AS on 07/2019   Carotid stenosis    1039 bilateral by dopplers 03/2017.    colon ca dx'd 1970   surg only   Coronary artery calcification seen on CT scan 09/26/2022   - Cath in 2002 normal - Myoview in 2017 low risk - Admx in 12/2021 w AF w RVR and elevated hsT c/w NSTEMI -  pt preferred conservative mgmt (no cath)    DDD (degenerative disc disease)    Diverticulitis    Diverticulosis    DM (diabetes mellitus) (HCC)    Duodenitis    peptic, with gastric heterotopia   Endometriosis    s/p hysterectomy   Fundic gland polyposis of stomach    GERD (gastroesophageal reflux disease)    Heart murmur    Hiatal hernia 02/03/2005   History of cerebral aneurysm repair    s/p coiling   History of hemorrhoids    with bleeding   History of shingles    HLD (hyperlipidemia)    HTN (hypertension)    Hypercholesteremia    IBS (irritable bowel syndrome)    Iron deficiency    Lung nodule 07/12/2021   CT 05/2021: 3 mm right solid pulmonary nodule-no  routine follow-up imaging recommended   Migraine    MVP (mitral valve prolapse)    Osteopenia    Peripheral neuropathy    Toe fracture, right    second toe   UTI (lower urinary tract infection)    Varicose vein     Past Surgical History:  Procedure Laterality Date   ABDOMINAL HYSTERECTOMY     APPENDECTOMY     CARPAL TUNNEL RELEASE  08/26/07   CATARACT EXTRACTION     CEREBRAL ANEURYSM REPAIR     COLON RESECTION     COLONOSCOPY  06/07/08   divertiulosis, internal hemorrhoids   COLONOSCOPY W/ BIOPSIES AND POLYPECTOMY  02/03/05   diverticulosis, 4 mm sessile polyps, internal and external hemorrhoids   CORONARY ANGIOPLASTY WITH STENT PLACEMENT     ESOPHAGOGASTRODUODENOSCOPY  02/03/05   hiatal hernia, 6 benign gastric polyps   FLEXIBLE SIGMOIDOSCOPY     HAND SURGERY Right    INTRAOCULAR LENS INSERTION     right hand decompressive fasciotomy  08/26/07   , dorsal and volar   TONSILLECTOMY      Current Medications: No outpatient medications have been marked as taking for the 01/11/24 encounter (Appointment) with Madie Jon Garre, PA.     Allergies:   Amoxicillin , Fosamax [alendronate sodium], Ms contin  [morphine ], Pioglitazone, Rosiglitazone, Ultram  [tramadol ], Atorvastatin, Glimepiride, Linagliptin, Metformin and related, Repaglinide, Sitagliptin, Zoloft [sertraline hcl], Aspirin , Bentyl [dicyclomine hcl], Cephalexin, Cipro  [ciprofloxacin  hcl], Ciprofloxacin , Dicyclomine, Hctz [hydrochlorothiazide ], Morphine  and codeine, Norvasc  [amlodipine  besylate], Oxycontin [oxycodone], Penicillin g, Sulfa antibiotics, Penicillins, and Sulfamethoxazole-trimethoprim   Social History   Socioeconomic History   Marital status: Married    Spouse name: Charles   Number of children: 0   Years of education: Not on file   Highest education level: Not on file  Occupational History   Occupation: retired    Associate Professor: RETIRED  Tobacco Use   Smoking status: Never   Smokeless tobacco: Never  Vaping Use    Vaping status: Never Used  Substance and Sexual Activity   Alcohol use: No   Drug use: No   Sexual activity: Not Currently  Other Topics Concern   Not on file  Social History Narrative   Lives with husband    Right handed   Caffeine: 2 c of coffee a day   Social Drivers of Corporate Investment Banker Strain: Not on file  Food Insecurity: No Food Insecurity (07/13/2023)   Hunger Vital Sign    Worried About Running Out of Food in the Last Year: Never true    Ran Out of Food in the Last Year: Never true  Transportation Needs: No Transportation Needs (07/13/2023)   PRAPARE - Transportation  Lack of Transportation (Medical): No    Lack of Transportation (Non-Medical): No  Physical Activity: Not on file  Stress: Not on file  Social Connections: Socially Isolated (07/13/2023)   Social Connection and Isolation Panel    Frequency of Communication with Friends and Family: More than three times a week    Frequency of Social Gatherings with Friends and Family: More than three times a week    Attends Religious Services: Never    Database Administrator or Organizations: No    Attends Banker Meetings: Never    Marital Status: Widowed     Family History: The patient's ***family history includes Colon cancer in an other family member; Diabetes in her brother, brother, paternal grandmother, and another family member; Heart disease in her brother, brother, and father; Hypertension in her brother; Kidney disease in her father; Microcephaly in her brother; Ovarian cancer in her mother.  ROS:   Please see the history of present illness.    *** All other systems reviewed and are negative.  EKGs/Labs/Other Studies Reviewed:    The following studies were reviewed today: ***      Recent Labs: 06/18/2023: ALT 15 06/21/2023: TSH 1.313 07/16/2023: Magnesium  2.4 09/06/2023: B Natriuretic Peptide 252.6; Hemoglobin 11.5; Platelets 301 09/20/2023: BUN 29; Creatinine, Ser 1.29; Potassium  4.6; Sodium 138  Recent Lipid Panel    Component Value Date/Time   CHOL 144 01/02/2022 1056   TRIG 101 01/02/2022 1056   HDL 32 (L) 01/02/2022 1056   CHOLHDL 4.5 01/02/2022 1056   VLDL 20 01/02/2022 1056   LDLCALC 92 01/02/2022 1056     Risk Assessment/Calculations:   {Does this patient have ATRIAL FIBRILLATION?:[id number]}  No BP recorded.  {Refresh Note OR Click here to enter BP  :1}***         Physical Exam:    VS:  There were no vitals taken for this visit.    Wt Readings from Last 3 Encounters:  11/10/23 172 lb (78 kg)  09/20/23 167 lb 6.4 oz (75.9 kg)  07/19/23 158 lb 1.1 oz (71.7 kg)     GEN: *** Well nourished, well developed in no acute distress HEENT: Normal NECK: No JVD; No carotid bruits LYMPHATICS: No lymphadenopathy CARDIAC: ***RRR, no murmurs, rubs, gallops RESPIRATORY:  Clear to auscultation without rales, wheezing or rhonchi  ABDOMEN: Soft, non-tender, non-distended MUSCULOSKELETAL:  No edema; No deformity  SKIN: Warm and dry NEUROLOGIC:  Alert and oriented x 3 PSYCHIATRIC:  Normal affect   ASSESSMENT:    No diagnosis found. PLAN:    In order of problems listed above:  ***      {Are you ordering a CV Procedure (e.g. stress test, cath, DCCV, TEE, etc)?   Press F2        :[id number]}    Medication Adjustments/Labs and Tests Ordered: Current medicines are reviewed at length with the patient today.  Concerns regarding medicines are outlined above.  No orders of the defined types were placed in this encounter.  No orders of the defined types were placed in this encounter.   There are no Patient Instructions on file for this visit.   Signed, Jon Nat Hails, PA  01/09/2024 5:14 PM    [facility]

## 2024-01-11 ENCOUNTER — Ambulatory Visit: Admitting: Physician Assistant

## 2024-02-14 ENCOUNTER — Telehealth: Payer: Self-pay | Admitting: Cardiology

## 2024-02-14 DIAGNOSIS — Z79899 Other long term (current) drug therapy: Secondary | ICD-10-CM

## 2024-02-14 DIAGNOSIS — I10 Essential (primary) hypertension: Secondary | ICD-10-CM

## 2024-02-14 DIAGNOSIS — I5033 Acute on chronic diastolic (congestive) heart failure: Secondary | ICD-10-CM

## 2024-02-14 NOTE — Telephone Encounter (Signed)
 Voice mail has not been set up. Could not leave message.

## 2024-02-14 NOTE — Telephone Encounter (Signed)
" °  If swelling, where is the swelling located? Both legs   How much weight have you gained and in what time span? 10 pound in one month   Have you gained 2 pounds in a day or 5 pounds in a week? \  Do you have a log of your daily weights (if so, list)? Yes   Are you currently taking a fluid pill? Yes   Are you currently SOB? No   Have you traveled recently in a car or plane for an extended period of time? No   Sherry with [name] calling concerned about patients increased swelling. She is requesting c/b to discuss a care plan. Please advise.  "

## 2024-02-15 NOTE — Telephone Encounter (Signed)
 Returned call, voice mail box not set up.

## 2024-02-19 NOTE — Telephone Encounter (Signed)
 Call to [place] at [place] [phone]). Joen states patient's weight is up to 187 and legs are swollen. (Last weight in chart is 172 lb at our office on 11/10/2023). Joen verifies that patient is taking 40 mg daily but is unsure of frequency of extra PRN dosage (20 mg). Joen states patient denies SOB but is very sedentary and the furthest she walks is to the bathroom. Last Creatinine was 10/18/24 at 0.98. Sherry requests appointment. Forwarded to Dr. Shlomo for advice.

## 2024-02-20 NOTE — Telephone Encounter (Signed)
 Call to [facility] [place] at [place] and advised to increase Lasix  to 40mg  BID for 3 days and then back to 40mg  daily . Order TED hose compression stockings 20-8mmHg to wear during the day. Needs to elevate legs when sitting. Needs <2gm Na diet. [name] verbalizes understanding and agrees to plan, states she will call back Friday or next Monday to advise of progress.

## 2024-02-29 NOTE — Telephone Encounter (Signed)
 Second attempt to call Joen at [place] to check on patient. Joen says that patient's weight initially improved after 3 days on increased lasix  dosing, but unfortunately patient's weight is back up to 186 lb today. Joen states patient continues to deny SOB. Joen states patient has a difficulty time elevated legs due to back pain and is unable to get compression hose on due to arthritis even with staff assistance. Forwarded to Dr. Shlomo for advice.

## 2024-03-05 NOTE — Telephone Encounter (Signed)
 Spoke to [place] at [place], who agrees to run a BMET and BNP for patient. Joen advises that they have been having delays with the lab they use. Orders placed, will fax to [phone].
